# Patient Record
Sex: Male | Born: 1955 | Race: White | Hispanic: No
Health system: Southern US, Academic
[De-identification: ages and names within clinical notes are randomized; demographics above are authoritative.]

## PROBLEM LIST (undated history)

## (undated) ENCOUNTER — Ambulatory Visit: Attending: Physician Assistant | Primary: Physician Assistant

## (undated) ENCOUNTER — Ambulatory Visit

## (undated) ENCOUNTER — Telehealth
Attending: Student in an Organized Health Care Education/Training Program | Primary: Student in an Organized Health Care Education/Training Program

## (undated) ENCOUNTER — Ambulatory Visit: Payer: PRIVATE HEALTH INSURANCE | Attending: Family | Primary: Family

## (undated) ENCOUNTER — Ambulatory Visit: Payer: PRIVATE HEALTH INSURANCE

## (undated) ENCOUNTER — Telehealth

## (undated) ENCOUNTER — Encounter: Payer: PRIVATE HEALTH INSURANCE | Attending: Urology | Primary: Urology

## (undated) ENCOUNTER — Ambulatory Visit: Attending: Urology | Primary: Urology

## (undated) ENCOUNTER — Encounter
Attending: Pharmacist Clinician (PhC)/ Clinical Pharmacy Specialist | Primary: Pharmacist Clinician (PhC)/ Clinical Pharmacy Specialist

## (undated) ENCOUNTER — Telehealth
Attending: Pharmacist Clinician (PhC)/ Clinical Pharmacy Specialist | Primary: Pharmacist Clinician (PhC)/ Clinical Pharmacy Specialist

## (undated) ENCOUNTER — Encounter: Attending: General Practice | Primary: General Practice

## (undated) ENCOUNTER — Ambulatory Visit
Attending: Pharmacist Clinician (PhC)/ Clinical Pharmacy Specialist | Primary: Pharmacist Clinician (PhC)/ Clinical Pharmacy Specialist

## (undated) ENCOUNTER — Ambulatory Visit: Payer: PRIVATE HEALTH INSURANCE | Attending: Urology | Primary: Urology

## (undated) ENCOUNTER — Telehealth: Attending: Urology | Primary: Urology

## (undated) ENCOUNTER — Ambulatory Visit
Attending: Student in an Organized Health Care Education/Training Program | Primary: Student in an Organized Health Care Education/Training Program

## (undated) ENCOUNTER — Encounter: Attending: Family Medicine | Primary: Family Medicine

## (undated) ENCOUNTER — Encounter

## (undated) ENCOUNTER — Ambulatory Visit: Attending: Family Medicine | Primary: Family Medicine

## (undated) DIAGNOSIS — N3289 Other specified disorders of bladder: Secondary | ICD-10-CM

## (undated) DIAGNOSIS — N319 Neuromuscular dysfunction of bladder, unspecified: Secondary | ICD-10-CM

## (undated) DIAGNOSIS — N318 Other neuromuscular dysfunction of bladder: Secondary | ICD-10-CM

## (undated) DIAGNOSIS — IMO0002 Reserved for concepts with insufficient information to code with codable children: Secondary | ICD-10-CM

## (undated) DIAGNOSIS — E785 Hyperlipidemia, unspecified: Secondary | ICD-10-CM

## (undated) DIAGNOSIS — G4700 Insomnia, unspecified: Secondary | ICD-10-CM

## (undated) DIAGNOSIS — I5030 Unspecified diastolic (congestive) heart failure: Secondary | ICD-10-CM

## (undated) DIAGNOSIS — F419 Anxiety disorder, unspecified: Secondary | ICD-10-CM

## (undated) DIAGNOSIS — F1190 Opioid use, unspecified, uncomplicated: Secondary | ICD-10-CM

## (undated) DIAGNOSIS — N529 Male erectile dysfunction, unspecified: Secondary | ICD-10-CM

## (undated) DIAGNOSIS — I739 Peripheral vascular disease, unspecified: Secondary | ICD-10-CM

## (undated) DIAGNOSIS — Z87898 Personal history of other specified conditions: Secondary | ICD-10-CM

## (undated) DIAGNOSIS — M87051 Idiopathic aseptic necrosis of right femur: Secondary | ICD-10-CM

## (undated) DIAGNOSIS — Z8619 Personal history of other infectious and parasitic diseases: Secondary | ICD-10-CM

## (undated) DIAGNOSIS — K219 Gastro-esophageal reflux disease without esophagitis: Secondary | ICD-10-CM

## (undated) DIAGNOSIS — R351 Nocturia: Secondary | ICD-10-CM

## (undated) DIAGNOSIS — R112 Nausea with vomiting, unspecified: Secondary | ICD-10-CM

## (undated) DIAGNOSIS — E119 Type 2 diabetes mellitus without complications: Secondary | ICD-10-CM

## (undated) DIAGNOSIS — M1990 Unspecified osteoarthritis, unspecified site: Secondary | ICD-10-CM

## (undated) DIAGNOSIS — R32 Unspecified urinary incontinence: Secondary | ICD-10-CM

## (undated) DIAGNOSIS — G894 Chronic pain syndrome: Secondary | ICD-10-CM

## (undated) DIAGNOSIS — F319 Bipolar disorder, unspecified: Secondary | ICD-10-CM

## (undated) DIAGNOSIS — I700 Atherosclerosis of aorta: Secondary | ICD-10-CM

## (undated) DIAGNOSIS — F32A Depression, unspecified: Secondary | ICD-10-CM

## (undated) DIAGNOSIS — K759 Inflammatory liver disease, unspecified: Secondary | ICD-10-CM

## (undated) DIAGNOSIS — Z9889 Other specified postprocedural states: Secondary | ICD-10-CM

## (undated) DIAGNOSIS — F329 Major depressive disorder, single episode, unspecified: Secondary | ICD-10-CM

## (undated) DIAGNOSIS — G971 Other reaction to spinal and lumbar puncture: Secondary | ICD-10-CM

## (undated) DIAGNOSIS — M87052 Idiopathic aseptic necrosis of left femur: Secondary | ICD-10-CM

## (undated) DIAGNOSIS — D649 Anemia, unspecified: Secondary | ICD-10-CM

## (undated) DIAGNOSIS — R918 Other nonspecific abnormal finding of lung field: Secondary | ICD-10-CM

## (undated) DIAGNOSIS — E349 Endocrine disorder, unspecified: Secondary | ICD-10-CM

## (undated) DIAGNOSIS — G8929 Other chronic pain: Secondary | ICD-10-CM

## (undated) DIAGNOSIS — F1910 Other psychoactive substance abuse, uncomplicated: Secondary | ICD-10-CM

## (undated) DIAGNOSIS — M25562 Pain in left knee: Secondary | ICD-10-CM

## (undated) HISTORY — DX: Unspecified urinary incontinence: R32

## (undated) HISTORY — PX: BACK SURGERY: SHX140

## (undated) HISTORY — PX: LUMBAR FUSION: SHX111

## (undated) HISTORY — PX: JOINT REPLACEMENT: SHX530

## (undated) MED ORDER — ATORVASTATIN 40 MG TABLET: Freq: Every day | ORAL | 0 days

---
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
## 1998-01-10 HISTORY — PX: SHOULDER OPEN ROTATOR CUFF REPAIR: SHX2407

## 1999-01-11 HISTORY — PX: PARTIAL HIP ARTHROPLASTY: SHX733

## 2000-01-11 HISTORY — PX: TOTAL HIP ARTHROPLASTY: SHX124

## 2002-06-11 HISTORY — PX: TOTAL KNEE ARTHROPLASTY: SHX125

## 2002-08-11 HISTORY — PX: MANIPULATION KNEE JOINT: SUR841

## 2003-01-11 HISTORY — PX: TOTAL KNEE REVISION: SHX996

## 2003-01-11 HISTORY — PX: LUMBAR DISC SURGERY: SHX700

## 2003-07-11 ENCOUNTER — Other Ambulatory Visit: Payer: Self-pay

## 2004-07-07 ENCOUNTER — Ambulatory Visit: Payer: Self-pay | Admitting: Unknown Physician Specialty

## 2004-12-25 ENCOUNTER — Other Ambulatory Visit: Payer: Self-pay

## 2004-12-26 ENCOUNTER — Inpatient Hospital Stay: Payer: Self-pay | Admitting: Internal Medicine

## 2005-01-10 HISTORY — PX: INTERSTIM IMPLANT PLACEMENT: SHX5130

## 2005-01-29 ENCOUNTER — Ambulatory Visit: Payer: Self-pay | Admitting: Family Medicine

## 2005-02-01 ENCOUNTER — Ambulatory Visit: Payer: Self-pay | Admitting: Family Medicine

## 2005-11-01 ENCOUNTER — Ambulatory Visit: Payer: Self-pay | Admitting: Urology

## 2005-12-06 ENCOUNTER — Ambulatory Visit: Payer: Self-pay | Admitting: Urology

## 2006-01-10 HISTORY — PX: COLONOSCOPY: SHX174

## 2006-01-10 HISTORY — PX: TOTAL KNEE REVISION: SHX996

## 2006-02-03 ENCOUNTER — Emergency Department: Payer: Self-pay | Admitting: Emergency Medicine

## 2006-02-16 ENCOUNTER — Ambulatory Visit: Payer: Self-pay | Admitting: Gastroenterology

## 2007-01-29 ENCOUNTER — Encounter: Admission: RE | Admit: 2007-01-29 | Discharge: 2007-01-29 | Payer: Self-pay | Admitting: Orthopedic Surgery

## 2007-03-30 ENCOUNTER — Encounter: Payer: Self-pay | Admitting: Orthopedic Surgery

## 2007-04-11 ENCOUNTER — Encounter: Payer: Self-pay | Admitting: Orthopedic Surgery

## 2007-05-01 ENCOUNTER — Inpatient Hospital Stay (HOSPITAL_COMMUNITY): Admission: RE | Admit: 2007-05-01 | Discharge: 2007-05-03 | Payer: Self-pay | Admitting: Orthopedic Surgery

## 2007-05-01 HISTORY — PX: OTHER SURGICAL HISTORY: SHX169

## 2007-05-11 ENCOUNTER — Encounter: Payer: Self-pay | Admitting: Orthopedic Surgery

## 2007-06-11 ENCOUNTER — Encounter: Payer: Self-pay | Admitting: Orthopedic Surgery

## 2007-07-11 ENCOUNTER — Encounter: Payer: Self-pay | Admitting: Orthopedic Surgery

## 2007-09-21 ENCOUNTER — Encounter: Payer: Self-pay | Admitting: Physical Medicine and Rehabilitation

## 2007-10-11 ENCOUNTER — Encounter: Payer: Self-pay | Admitting: Physical Medicine and Rehabilitation

## 2008-03-17 ENCOUNTER — Emergency Department: Payer: Self-pay | Admitting: Emergency Medicine

## 2009-10-13 ENCOUNTER — Encounter: Payer: Self-pay | Admitting: Orthopedic Surgery

## 2009-11-10 ENCOUNTER — Encounter: Payer: Self-pay | Admitting: Orthopedic Surgery

## 2009-12-10 ENCOUNTER — Encounter: Payer: Self-pay | Admitting: Orthopedic Surgery

## 2010-05-25 NOTE — Op Note (Signed)
NAME:  MITCHAEL, LUCKEY NO.:  [id number]   MEDICAL RECORD NO.:  [id number]          PATIENT TYPE:  INP   LOCATION:  1604                         FACILITY:  [facility]   PHYSICIAN:  Madlyn Frankel. Charlann Boxer, M.D.  DATE OF BIRTH:  [birth year]   DATE OF PROCEDURE:  05/01/2007  DATE OF DISCHARGE:                               OPERATIVE REPORT   PREOPERATIVE DIAGNOSIS:  Painful left total knee replacement,  predominately with mechanical type symptoms along the medial side of the  knee with no signs or concerns for infection.   POSTOPERATIVE DIAGNOSIS:  Painful left total knee replacement,  predominately with mechanical type symptoms along the medial side of the  knee with no signs or concerns for infection.   FINDINGS:  Along the medial side of the knee was demarcated prior to the  procedure and identified some ridging within the soft tissue scarring of  the capsule which could have correlated to the infrapatellar branch of  the saphenous nerve.  Intra-articular noted some scar tissue in addition  to some possible bony impingement along the medial distal femur.   PROCEDURE:  An open debridement of the left total knee including removal  of old sutures and a significant scar debridement and debridement of  bony overgrowth.   SURGEON:  Madlyn Frankel. Charlann Boxer, M.D.   ASSISTANT:  None.   ANESTHESIA:  General.   BLOOD LOSS:  None.   TOURNIQUET TIME:  40 minutes at 225 mmHg.   COMPLICATIONS:  None.   DRAINS:  None.   INDICATIONS FOR PROCEDURE:  Dilan is a 55 year old male who has been  followed in the office for his left knee pain.  He had revision knee  surgery about a year ago with persistent problems with stiffness,  particularly with extension and pain medially.  Initial examination had  raised some concerns about potential neuritis over the saphenous branch.  He had some continued complaints despite attempts at trying to manage  this with superficial topical agents.  He also  described mechanical  symptoms and noted this mainly with stair climbing.  Any time he would  lift his leg and support weight, he would get a pinching type sensation  medially.  He had a palpable area was very tender.  We discussed the  treatment options and given the persistence of discomfort, he wished to  at least exam of the possibility of an open debridement.  Risks,  benefits discussed.  Consent obtained.   PROCEDURE IN DETAIL:  The patient was brought to operative theater.  Once adequate anesthesia, preoperative antibiotics, Ancef were  administered, the patient was positioned supine.  I placed a left  proximal thigh tourniquet.  The left lower extremity was then pre  scrubbed and prepped and draped in sterile fashion.  I did utilize the  patient's old incision, excised it as there were two separate incisions  in the midportion of it.   I created soft tissue flaps down the extensor mechanism identifying a  layer of Ethilon sutures.  I went ahead and created an arthrotomy with  clear synovial joint fluid present.  I spent time to debride this  remaining sutures removed those to prevent the potential granulation and  reaction to the Ethibond suture.   With the soft tissue sleeve created an arthrotomy was made.  I  focused  on area that was identified in the preoperative holding area. Any thing  that was in this area, anything that I was able to palpate that was  abnormal and felt to be a ridge I debrided.  I debrided and then  identified which appeared to be nerve type tissue and debrided this back  to a stable level on the medial side of the knee.   Intra-articularly debrided a significant amount of scar again from the  undersurface of the extensor mechanism and capsule medially.  Trying to  reproduce some of the mechanism of pain producing movements i.e. with  the leg lifted or with the knee bent and found that some bony overgrowth  on the medial side knee or residual  osteophytes that I used a 1/4 inch  osteotome to debride.   At that time that I had finished this procedure the entire medial side  of the knee was debrided back to stable level without evidence of any  impinging soft tissue and medial collateral ligament obviously kept  intact.   There was nothing abnormal with the patella.  There was nothing abnormal  with the tibial tray nor the tibial insert.  Once I was satisfied there  was no other palpable defects nor any other tissue that could be  debrided,  I irrigated the knee with 500 mL of normal saline solution  with pulse lavage.  I reapproximated extensor mechanism this time with a  #1 Vicryl in a running fashion with limited knots.  I  then  reapproximated subcu layer with 2-0 Vicryl and a running 4-0 Monocryl on  the skin.  The skin was cleaned, dried, dressed sterilely with Steri-  Strips.  A tourniquet is let down 40 minutes.  The knee was then dressed  into a sterile bulky Jones dressing and he was brought to recovery room  extubated in stable condition tolerating the procedure well.      Madlyn Frankel Charlann Boxer, M.D.  Electronically Signed     MDO/MEDQ  D:  05/01/2007  T:  05/02/2007  Job:  [id number]

## 2010-08-13 ENCOUNTER — Ambulatory Visit: Payer: Self-pay | Admitting: Specialist

## 2010-10-05 LAB — DIFFERENTIAL
Basophils Absolute: 0
Basophils Relative: 0
Eosinophils Absolute: 0.1
Eosinophils Relative: 1
Lymphocytes Relative: 29
Lymphs Abs: 1.8
Monocytes Absolute: 0.4
Monocytes Relative: 6
Neutro Abs: 4.1
Neutrophils Relative %: 64

## 2010-10-05 LAB — COMPREHENSIVE METABOLIC PANEL
ALT: 34
AST: 28
Albumin: 4.2
Alkaline Phosphatase: 55
BUN: 10
CO2: 30
Calcium: 9.8
Chloride: 103
Creatinine, Ser: 1.04
GFR calc non Af Amer: >60
Glucose, Bld: 137 — ABNORMAL HIGH
Potassium: 4.2
Sodium: 142
Total Bilirubin: 1.2
Total Protein: 7.5

## 2010-10-05 LAB — CBC
HCT: 37.6 — ABNORMAL LOW
HCT: 46.1
Hemoglobin: 13.2
Hemoglobin: 16.1
MCHC: 35
MCHC: 35
MCV: 88.8
MCV: 89.5
Platelets: 177
Platelets: 222
RBC: 4.2 — ABNORMAL LOW
RBC: 5.2
RDW: 12.8
RDW: 13.3
WBC: 6.4
WBC: 8.7

## 2010-10-05 LAB — APTT: aPTT: 30

## 2010-10-05 LAB — BASIC METABOLIC PANEL
BUN: 10
CO2: 33 — ABNORMAL HIGH
Calcium: 8.4
Chloride: 100
Creatinine, Ser: 1.06
GFR calc non Af Amer: >60
Glucose, Bld: 105 — ABNORMAL HIGH
Potassium: 3.7
Sodium: 139

## 2010-10-05 LAB — URINALYSIS, ROUTINE W REFLEX MICROSCOPIC
Bilirubin Urine: NEGATIVE
Glucose, UA: NEGATIVE
Hgb urine dipstick: NEGATIVE
Ketones, ur: NEGATIVE
Nitrite: NEGATIVE
Protein, ur: NEGATIVE
Specific Gravity, Urine: 1.022
Urobilinogen, UA: 0.2
pH: 5.5

## 2010-10-05 LAB — PROTIME-INR
INR: 1
Prothrombin Time: 13.7

## 2010-12-31 ENCOUNTER — Emergency Department: Payer: Self-pay | Admitting: *Deleted

## 2011-08-30 ENCOUNTER — Ambulatory Visit: Payer: Self-pay | Admitting: Specialist

## 2012-07-16 DIAGNOSIS — M5136 Other intervertebral disc degeneration, lumbar region: Secondary | ICD-10-CM | POA: Insufficient documentation

## 2012-07-16 DIAGNOSIS — G479 Sleep disorder, unspecified: Secondary | ICD-10-CM | POA: Insufficient documentation

## 2012-07-16 DIAGNOSIS — F329 Major depressive disorder, single episode, unspecified: Secondary | ICD-10-CM | POA: Insufficient documentation

## 2012-07-26 ENCOUNTER — Other Ambulatory Visit: Payer: Self-pay | Admitting: Urology

## 2012-08-10 ENCOUNTER — Encounter (HOSPITAL_BASED_OUTPATIENT_CLINIC_OR_DEPARTMENT_OTHER): Payer: Self-pay | Admitting: *Deleted

## 2012-08-10 NOTE — Progress Notes (Signed)
NPO AFTER MN. ARRIVES AT 1000. NEEDS HG. WILL TAKE OXYCONTIN, OXYCODONE, LEXAPRO, AND WELLBUTRIN AM OF SURG W/ SIPS OF WATER.

## 2012-08-16 NOTE — H&P (Signed)
History of Present Illness   [name] had an Interstim possibly 7-8 years ago by Dr. Cecilie Martinez. For some personal reasons he wanted to be followed here and I have seen him in the past. He says the Interstim was placed for urge incontinence and maybe incontinence after voiding. It was placed for frequency, decreased stream and nocturia. When the Interstim was working a few weeks ago, he was getting up 3 times a night instead of 6 and he was dry or nearly dry, going less frequency with a better flow.   In the last week, he is leaking, especially after he voids. He is getting up many times at night. He is going more frequently during the day. His flow is slower.   There is no other modifying factors or associated signs or symptoms. There is no other aggravating or relieving factors. The symptoms are moderate in severity and persisting.   Urinalysis: I reviewed, negative. Urine sent for culture.  On physical examination, the device is fairly high in the right upper buttock but there is no evidence of infection or cellulitis and it is nontender.    Past Medical History Problems  1. History of  Adult Sleep Apnea 780.57 2. History of  Anxiety (Symptom) 300.00 3. History of  Arthritis V13.4 4. History of  Esophageal Reflux 530.81 5. History of  Hepatitis 573.3  Surgical History Problems  1. History of  Back Surgery 2. History of  Bladder Surgery 3. History of  Hand Surgery 4. History of  Hip Surgery 5. History of  Knee Surgery 6. History of  Shoulder Surgery  Current Meds 1. Ambien 10 MG Oral Tablet; Therapy: (Recorded:12Oct2010) to 2. Diazepam TABS; Therapy: (Recorded:12Oct2010) to 3. OxyCODONE HCl TABS; Therapy: (Recorded:12Oct2010) to 4. OxyCONTIN TB12; Therapy: (Recorded:12Oct2010) to 5. PriLOSEC CPDR; Therapy: (Recorded:12Oct2010) to  Family History Problems  1. Family history of  Family Health Status Number Of Children 2 sons1 daughter  Social History Problems  1. Marital  History - Divorced 2. Occupation: disabled Denied  3. History of  Alcohol Use 4. History of  Caffeine Use 5. History of  Tobacco Use  Results/Data  Urine [Data Includes: Last 1 Day]   16Jul2014  COLOR ORANGE   APPEARANCE CLEAR   SPECIFIC GRAVITY >1.030   pH 6.0   GLUCOSE 250 mg/dL  BILIRUBIN NEG   KETONE NEG mg/dL  BLOOD NEG   PROTEIN 30 mg/dL  UROBILINOGEN 0.2 mg/dL  NITRITE NEG   LEUKOCYTE ESTERASE NEG   SQUAMOUS EPITHELIAL/HPF NONE SEEN   WBC 0-2 WBC/hpf  RBC NONE SEEN RBC/hpf  BACTERIA RARE   CRYSTALS NONE SEEN   CASTS NONE SEEN   Other MUCUS AND AMORPHOUS NOTED    Assessment Assessed  1. Nocturia 788.43 2. 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 My plan is to change the generator but also test the lead intraoperatively. The patient consented to replacement of the IPG and possibly the lead if needed. Whichever is replaced, I cannot promise that it will be effective, but hopefully it will be. General risks were reviewed and he has had one before and by history no revisions.   Pros, cons, success and failure rates of Interstim were discussed. We talked about the test stimulation (office/operating room) and the second stage procedure. Risks were described but not limited to the risk of persistent, de novo, or worsening incontinence. Risks of pain, bleeding, infection,  and neuropathy were discussed. Risk of malfunction, migration, and breakage were discussed. Trouble-shooting, battery life, and the need for explanation and reoperation were discussed. MRI issues were discussed. The patient understands that she might not reach her treatment goal and that she might be worse following surgery.  He understand that he may be at slight increased risk of infection.  He does have significant back issues noted today.   When [facility] did the troubleshooting, it said end of  service. He was able to check impedance and they be abnormal in all 4 leads, but it may be a false reading because of the end of service reading. The patient had been consented to replace the lead if needed.   After a thorough review of the management options for the patient's condition the patient  elected to proceed with surgical therapy as noted above. We have discussed the potential benefits and risks of the procedure, side effects of the proposed treatment, the likelihood of the patient achieving the goals of the procedure, and any potential problems that might occur during the procedure or recuperation. Informed consent has been obtained.

## 2012-08-17 ENCOUNTER — Ambulatory Visit (HOSPITAL_COMMUNITY): Payer: Medicare Other

## 2012-08-17 ENCOUNTER — Encounter (HOSPITAL_BASED_OUTPATIENT_CLINIC_OR_DEPARTMENT_OTHER): Payer: Self-pay | Admitting: Anesthesiology

## 2012-08-17 ENCOUNTER — Ambulatory Visit (HOSPITAL_BASED_OUTPATIENT_CLINIC_OR_DEPARTMENT_OTHER): Payer: Medicare Other | Admitting: Anesthesiology

## 2012-08-17 ENCOUNTER — Ambulatory Visit (HOSPITAL_BASED_OUTPATIENT_CLINIC_OR_DEPARTMENT_OTHER)
Admission: RE | Admit: 2012-08-17 | Discharge: 2012-08-17 | Disposition: A | Discharge status: Home / Self Care | Payer: Medicare Other | Source: Ambulatory Visit | Attending: Urology | Admitting: Urology

## 2012-08-17 ENCOUNTER — Encounter (HOSPITAL_BASED_OUTPATIENT_CLINIC_OR_DEPARTMENT_OTHER)
Admission: RE | Disposition: A | Discharge status: Home / Self Care | Payer: Self-pay | Source: Ambulatory Visit | Attending: Urology

## 2012-08-17 ENCOUNTER — Encounter (HOSPITAL_BASED_OUTPATIENT_CLINIC_OR_DEPARTMENT_OTHER): Payer: Self-pay

## 2012-08-17 DIAGNOSIS — Z79899 Other long term (current) drug therapy: Secondary | ICD-10-CM | POA: Insufficient documentation

## 2012-08-17 DIAGNOSIS — R350 Frequency of micturition: Secondary | ICD-10-CM | POA: Insufficient documentation

## 2012-08-17 DIAGNOSIS — N3941 Urge incontinence: Secondary | ICD-10-CM | POA: Diagnosis not present

## 2012-08-17 DIAGNOSIS — K219 Gastro-esophageal reflux disease without esophagitis: Secondary | ICD-10-CM | POA: Diagnosis not present

## 2012-08-17 DIAGNOSIS — Z462 Encounter for fitting and adjustment of other devices related to nervous system and special senses: Secondary | ICD-10-CM | POA: Insufficient documentation

## 2012-08-17 DIAGNOSIS — G4730 Sleep apnea, unspecified: Secondary | ICD-10-CM | POA: Diagnosis not present

## 2012-08-17 HISTORY — DX: Chronic pain syndrome: G89.4

## 2012-08-17 HISTORY — DX: Personal history of other infectious and parasitic diseases: Z86.19

## 2012-08-17 HISTORY — DX: Unspecified osteoarthritis, unspecified site: M19.90

## 2012-08-17 HISTORY — PX: INTERSTIM IMPLANT REVISION: SHX5138

## 2012-08-17 HISTORY — DX: Nocturia: R35.1

## 2012-08-17 HISTORY — DX: Reserved for concepts with insufficient information to code with codable children: IMO0002

## 2012-08-17 HISTORY — DX: Depression, unspecified: F32.A

## 2012-08-17 HISTORY — DX: Idiopathic aseptic necrosis of right femur: M87.052

## 2012-08-17 HISTORY — DX: Other chronic pain: G89.29

## 2012-08-17 HISTORY — DX: Anxiety disorder, unspecified: F41.9

## 2012-08-17 HISTORY — DX: Idiopathic aseptic necrosis of left femur: M87.051

## 2012-08-17 HISTORY — DX: Other specified postprocedural states: Z98.890

## 2012-08-17 HISTORY — DX: Major depressive disorder, single episode, unspecified: F32.9

## 2012-08-17 HISTORY — DX: Other specified disorders of bladder: N32.89

## 2012-08-17 HISTORY — DX: Pain in left knee: M25.562

## 2012-08-17 HISTORY — DX: Other neuromuscular dysfunction of bladder: N31.8

## 2012-08-17 HISTORY — DX: Nausea with vomiting, unspecified: R11.2

## 2012-08-17 HISTORY — DX: Idiopathic aseptic necrosis of right femur: M87.051

## 2012-08-17 LAB — POCT HEMOGLOBIN-HEMACUE: Hemoglobin: 13.8 g/dL (ref 13.0–17.0)

## 2012-08-17 SURGERY — REVISION, SACRAL NERVE STIMULATOR, INTERSTIM
Anesthesia: Monitor Anesthesia Care | Site: Back | Wound class: Clean

## 2012-08-17 MED ORDER — LACTATED RINGERS IV SOLN
INTRAVENOUS | Status: DC
  Administered 2012-08-17: 09:00:00 via INTRAVENOUS
  Filled 2012-08-17: qty 1000

## 2012-08-17 MED ORDER — STERILE WATER FOR IRRIGATION IR SOLN
Status: DC | PRN
  Administered 2012-08-17: 1000 mL

## 2012-08-17 MED ORDER — CEPHALEXIN 250 MG PO CAPS: 500.0000 mg | ORAL_CAPSULE | Freq: Three times a day (TID) | ORAL | Status: DC

## 2012-08-17 MED ORDER — CEFAZOLIN SODIUM 1-5 GM-% IV SOLN
1.0000 g | INTRAVENOUS | Status: DC
  Filled 2012-08-17: qty 50

## 2012-08-17 MED ORDER — FENTANYL CITRATE 0.05 MG/ML IJ SOLN
INTRAMUSCULAR | Status: DC | PRN
  Administered 2012-08-17 (×2): 100 ug via INTRAVENOUS

## 2012-08-17 MED ORDER — CEFAZOLIN SODIUM-DEXTROSE 2-3 GM-% IV SOLR
2.0000 g | INTRAVENOUS | Status: AC
  Administered 2012-08-17: 2 g via INTRAVENOUS
  Filled 2012-08-17: qty 50

## 2012-08-17 MED ORDER — LACTATED RINGERS IV SOLN
INTRAVENOUS | Status: DC | PRN
  Administered 2012-08-17: 09:00:00 via INTRAVENOUS

## 2012-08-17 MED ORDER — LIDOCAINE-EPINEPHRINE (PF) 1 %-1:200000 IJ SOLN
INTRAMUSCULAR | Status: DC | PRN
  Administered 2012-08-17: 22.5 mL

## 2012-08-17 MED ORDER — PROPOFOL 10 MG/ML IV EMUL
INTRAVENOUS | Status: DC | PRN
  Administered 2012-08-17: 50 ug/kg/min via INTRAVENOUS

## 2012-08-17 MED ORDER — BUPIVACAINE-EPINEPHRINE 0.5% -1:200000 IJ SOLN
INTRAMUSCULAR | Status: DC | PRN
  Administered 2012-08-17: 22.5 mL

## 2012-08-17 MED ORDER — MIDAZOLAM HCL 5 MG/5ML IJ SOLN
INTRAMUSCULAR | Status: DC | PRN
  Administered 2012-08-17 (×2): 2 mg via INTRAVENOUS

## 2012-08-17 MED ORDER — LIDOCAINE HCL (CARDIAC) 20 MG/ML IV SOLN
INTRAVENOUS | Status: DC | PRN
  Administered 2012-08-17: 80 mg via INTRAVENOUS

## 2012-08-17 SURGICAL SUPPLY — 44 items
BAG URINE LEG 500ML (DRAIN) IMPLANT
BANDAGE ADHESIVE 1X3 (GAUZE/BANDAGES/DRESSINGS) IMPLANT
BENZOIN TINCTURE PRP APPL 2/3 (GAUZE/BANDAGES/DRESSINGS) IMPLANT
BLADE HEX COATED 2.75 (ELECTRODE) ×2 IMPLANT
BLADE SURG 15 STRL LF DISP TIS (BLADE) ×1 IMPLANT
BLADE SURG 15 STRL SS (BLADE) ×1
CATH FOLEY 2WAY SLVR  5CC 16FR (CATHETERS)
CATH FOLEY 2WAY SLVR 5CC 16FR (CATHETERS) IMPLANT
CLOTH BEACON ORANGE TIMEOUT ST (SAFETY) ×2 IMPLANT
COVER MAYO STAND STRL (DRAPES) ×2 IMPLANT
COVER PROBE W GEL 5X96 (DRAPES) ×2 IMPLANT
COVER TABLE BACK 60X90 (DRAPES) ×2 IMPLANT
DERMABOND ADVANCED (GAUZE/BANDAGES/DRESSINGS) ×1
DERMABOND ADVANCED .7 DNX12 (GAUZE/BANDAGES/DRESSINGS) ×1 IMPLANT
DRAPE C-ARM 42X72 X-RAY (DRAPES) ×2 IMPLANT
DRAPE INCISE 23X17 IOBAN STRL (DRAPES) ×1
DRAPE INCISE IOBAN 23X17 STRL (DRAPES) ×1 IMPLANT
DRAPE LAPAROSCOPIC ABDOMINAL (DRAPES) ×2 IMPLANT
DRESSING TELFA 8X3 (GAUZE/BANDAGES/DRESSINGS) ×2 IMPLANT
DRSG TEGADERM 4X4.75 (GAUZE/BANDAGES/DRESSINGS) ×2 IMPLANT
ELECT REM PT RETURN 9FT ADLT (ELECTROSURGICAL)
ELECTRODE REM PT RTRN 9FT ADLT (ELECTROSURGICAL) IMPLANT
GAUZE SPONGE 4X4 12PLY STRL LF (GAUZE/BANDAGES/DRESSINGS) ×2 IMPLANT
GLOVE BIO SURGEON STRL SZ7.5 (GLOVE) ×2 IMPLANT
GOWN PREVENTION PLUS LG XLONG (DISPOSABLE) ×2 IMPLANT
GOWN STRL REIN XL XLG (GOWN DISPOSABLE) ×2 IMPLANT
INTRODUCER GUIDE DILATR SHEATH (SET/KITS/TRAYS/PACK) ×2 IMPLANT
LEAD (Lead) ×2 IMPLANT
NEEDLE FORAMEN 20GA 3.5  9CM (NEEDLE) IMPLANT
NEEDLE FORAMEN 20GA 5  12.5CM (NEEDLE) IMPLANT
NEEDLE HYPO 22GX1.5 SAFETY (NEEDLE) IMPLANT
PACK BASIN DAY SURGERY FS (CUSTOM PROCEDURE TRAY) ×2 IMPLANT
PENCIL BUTTON HOLSTER BLD 10FT (ELECTRODE) IMPLANT
PROGRAMMER ANTENNA EXT (UROLOGICAL SUPPLIES) ×2 IMPLANT
PROGRAMMER STIMUL 2.2X1.1X3.7 (UROLOGICAL SUPPLIES) ×2 IMPLANT
STAPLER VISISTAT 35W (STAPLE) IMPLANT
STIMULATOR INTERSTIM 2X1.7X.3 (Orthopedic Implant) ×2 IMPLANT
STRIP CLOSURE SKIN 1/2X4 (GAUZE/BANDAGES/DRESSINGS) ×2 IMPLANT
SUT VIC AB 3-0 SH 27 (SUTURE) ×2
SUT VIC AB 3-0 SH 27X BRD (SUTURE) ×2 IMPLANT
SUT VICRYL 4-0 PS2 18IN ABS (SUTURE) ×6 IMPLANT
SYR BULB IRRIGATION 50ML (SYRINGE) ×2 IMPLANT
SYR CONTROL 10ML LL (SYRINGE) IMPLANT
TOWEL OR 17X24 6PK STRL BLUE (TOWEL DISPOSABLE) ×4 IMPLANT

## 2012-08-17 NOTE — Op Note (Signed)
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 Lorin Picket Eular Panek  The patient has the above diagnoses consent the above procedure. Preoperative antibiotics were given. Skin preparation was done per usual  By palpation and AP fluoroscopy I could see the lead and generator and x-rays were taken. The lead looked a little bit low but it turned out to be in S3 but the angle was very flat  Total lidocaine epinephrine mixture was approximately 30 cc for the midline and lateral incision. I opened the lateral incision and carried my dissection down opening is pseudocapsule and easily delivering the IPG. It was disconnected with a screwdriver.  He had bellows response in all 4 positions but no toe. Because the x-ray findings and lack a toe response I decided to replace the lead  With fluoroscopy and direct vision and pulling on the lead I marked the midline incision. I made 2 cm in length and carried down through soft tissue feeling the lead. I passed a right angle underneath it easily delivered it from lateral to medial.  I then mobilized the lead with sharp and blunt dissection almost to the bone table. I used lateral fluoroscopy and placed a hemostat almost at the bone table and remove the lead in total.  I then placed a 3.5 framing needle through the S3 foramina in a more oblique angle and more medial to lateral. In order do so I had to bring it a little bit medial to the initial incision. I had excellent bellows and toe response. I then replaced the framing needle with the guide and then connected the incision with a scalpel. There is a slight bend to the incision but I prefer this or second incision.   I placed the white trocar to the appropriate depth using fluoroscopy. I passed the floppy lead to  the appropriate depth and pulled the white trocar back. He had excellent toe and bellows response in all 4 positions. The settings were low. They range between 2-4. X-rays are taking in the AP and lateral positions.  Lead was brought lateral from the medial incision with the passer. Generator was attached and placed in its original pocket. Impedance check was done and was excellent and normal in all 4 leads  Irrigation was utilized. 3-0 Vicryl for subcutaneous tissue followed by 4 subcuticular was utilized. Usual sterile dressing was placed

## 2012-08-17 NOTE — Progress Notes (Signed)
Received call from Dr. Sherron Monday.  He is unable to see pt prior to  D/c.  Apologies relayed to pt.  Pt instructed to remove dressing in 5-6 days.  Leave dermabond alone. It will peel off.  No heavy lifting or strenuous activity x 2weeks. Pt informed also that the entire leads and generator were replaced. Pt verbalized his understanding.

## 2012-08-17 NOTE — Interval H&P Note (Signed)
History and Physical Interval Note:  08/17/2012 10:07 AM  Erik Martinez  has presented today for surgery, with the diagnosis of MALFUNCTIONING INTERSTIM   The various methods of treatment have been discussed with the patient and family. After consideration of risks, benefits and other options for treatment, the patient has consented to  Procedure(s): REPLACMENT OF IPG PLUS/MINUS LEAD OF INTERSTIM IMPLANT  (N/A) as a surgical intervention .  The patient's history has been reviewed, patient examined, no change in status, stable for surgery.  I have reviewed the patient's chart and labs.  Questions were answered to the patient's satisfaction.     Neilah Fulwider A

## 2012-08-17 NOTE — Anesthesia Preprocedure Evaluation (Addendum)
Anesthesia Evaluation  Patient identified by MRN, date of birth, ID band Patient awake    Reviewed: Allergy & Precautions, H&P , NPO status , Patient's Chart, lab work & pertinent test results  History of Anesthesia Complications (+) PONV  Airway Mallampati: II TM Distance: >3 FB Neck ROM: Full    Dental  (+) Dental Advisory Given and Edentulous Upper   Pulmonary neg pulmonary ROS,  breath sounds clear to auscultation        Cardiovascular negative cardio ROS  Rhythm:Regular Rate:Normal     Neuro/Psych PSYCHIATRIC DISORDERS Anxiety Depression negative neurological ROS     GI/Hepatic negative GI ROS, Neg liver ROS,   Endo/Other  negative endocrine ROS  Renal/GU negative Renal ROS     Musculoskeletal negative musculoskeletal ROS (+)   Abdominal   Peds  Hematology negative hematology ROS (+)   Anesthesia Other Findings   Reproductive/Obstetrics negative OB ROS                          Anesthesia Physical Anesthesia Plan  ASA: II  Anesthesia Plan: MAC   Post-op Pain Management:    Induction: Intravenous  Airway Management Planned:   Additional Equipment:   Intra-op Plan:   Post-operative Plan:   Informed Consent: I have reviewed the patients History and Physical, chart, labs and discussed the procedure including the risks, benefits and alternatives for the proposed anesthesia with the patient or authorized representative who has indicated his/her understanding and acceptance.   Dental advisory given  Plan Discussed with: CRNA  Anesthesia Plan Comments:         Anesthesia Quick Evaluation

## 2012-08-17 NOTE — Transfer of Care (Signed)
Immediate Anesthesia Transfer of Care Note  Patient: Erik Martinez  Procedure(s) Performed: Procedure(s): REPLACMENT OF IPG PLUS REPLACE LEAD OF INTERSTIM IMPLANT  (N/A)  Patient Location: PACU  Anesthesia Type:MAC  Level of Consciousness: awake, alert  and oriented  Airway & Oxygen Therapy: Patient Spontanous Breathing  Post-op Assessment: Report given to PACU RN  Post vital signs: Reviewed and stable  Complications: No apparent anesthesia complications

## 2012-08-18 NOTE — Anesthesia Postprocedure Evaluation (Signed)
Anesthesia Post Note  Patient: Erik Martinez  Procedure(s) Performed: Procedure(s) (LRB): REPLACMENT OF IPG PLUS REPLACE LEAD OF INTERSTIM IMPLANT  (N/A)  Anesthesia type: MAC  Patient location: PACU  Post pain: Pain level controlled  Post assessment: Post-op Vital signs reviewed  Last Vitals: BP 104/64  Pulse 78  Temp(Src) 36.2 C (Oral)  Resp 16  Ht 5\' 11"  (1.803 m)  Wt 196 lb 8 oz (89.132 kg)  BMI 27.42 kg/m2  SpO2 95%  Post vital signs: Reviewed  Level of consciousness: awake  Complications: No apparent anesthesia complications

## 2012-08-21 ENCOUNTER — Encounter (HOSPITAL_BASED_OUTPATIENT_CLINIC_OR_DEPARTMENT_OTHER): Payer: Self-pay | Admitting: Urology

## 2012-09-14 ENCOUNTER — Ambulatory Visit: Payer: Self-pay | Admitting: Surgery

## 2012-10-03 ENCOUNTER — Ambulatory Visit: Payer: Self-pay | Admitting: Surgery

## 2012-10-03 DIAGNOSIS — G4730 Sleep apnea, unspecified: Secondary | ICD-10-CM

## 2012-10-03 LAB — CBC WITH DIFFERENTIAL/PLATELET
Basophil #: 0 10*3/uL (ref 0.0–0.1)
Basophil %: 0.5 %
Eosinophil #: 0.1 10*3/uL (ref 0.0–0.7)
Eosinophil %: 1.9 %
HCT: 44.1 % (ref 40.0–52.0)
HGB: 15.4 g/dL (ref 13.0–18.0)
Lymphocyte #: 2.4 10*3/uL (ref 1.0–3.6)
Lymphocyte %: 33.9 %
MCH: 31.1 pg (ref 26.0–34.0)
MCHC: 34.8 g/dL (ref 32.0–36.0)
MCV: 89 fL (ref 80–100)
Monocyte #: 0.4 x10 3/mm (ref 0.2–1.0)
Monocyte %: 5.9 %
Neutrophil #: 4.1 10*3/uL (ref 1.4–6.5)
Neutrophil %: 57.8 %
Platelet: 199 10*3/uL (ref 150–440)
RBC: 4.95 10*6/uL (ref 4.40–5.90)
RDW: 13.3 % (ref 11.5–14.5)
WBC: 7 10*3/uL (ref 3.8–10.6)

## 2012-10-03 LAB — BASIC METABOLIC PANEL
Anion Gap: 2 — ABNORMAL LOW (ref 7–16)
BUN: 13 mg/dL (ref 7–18)
Calcium, Total: 9 mg/dL (ref 8.5–10.1)
Chloride: 98 mmol/L (ref 98–107)
Co2: 33 mmol/L — ABNORMAL HIGH (ref 21–32)
Creatinine: 1.13 mg/dL (ref 0.60–1.30)
EGFR (African American): >60
EGFR (Non-African Amer.): >60
Glucose: 250 mg/dL — ABNORMAL HIGH (ref 65–99)
Osmolality: 275 (ref 275–301)
Potassium: 4.6 mmol/L (ref 3.5–5.1)
Sodium: 133 mmol/L — ABNORMAL LOW (ref 136–145)

## 2012-10-11 ENCOUNTER — Ambulatory Visit: Payer: Self-pay | Admitting: Unknown Physician Specialty

## 2012-10-12 LAB — PATHOLOGY REPORT

## 2012-11-20 ENCOUNTER — Ambulatory Visit: Payer: Self-pay | Admitting: Cardiovascular Disease

## 2012-11-20 LAB — BASIC METABOLIC PANEL
Anion Gap: 4 — ABNORMAL LOW (ref 7–16)
BUN: 19 mg/dL — ABNORMAL HIGH (ref 7–18)
Calcium, Total: 8.8 mg/dL (ref 8.5–10.1)
Chloride: 103 mmol/L (ref 98–107)
Co2: 30 mmol/L (ref 21–32)
Creatinine: 1.03 mg/dL (ref 0.60–1.30)
EGFR (African American): >60
EGFR (Non-African Amer.): >60
Glucose: 221 mg/dL — ABNORMAL HIGH (ref 65–99)
Osmolality: 283 (ref 275–301)
Potassium: 3.8 mmol/L (ref 3.5–5.1)
Sodium: 137 mmol/L (ref 136–145)

## 2012-11-20 LAB — CBC WITH DIFFERENTIAL/PLATELET
Basophil #: 0 10*3/uL (ref 0.0–0.1)
Basophil %: 0.6 %
Eosinophil #: 0.2 10*3/uL (ref 0.0–0.7)
Eosinophil %: 3.6 %
HCT: 39 % — ABNORMAL LOW (ref 40.0–52.0)
HGB: 13.7 g/dL (ref 13.0–18.0)
Lymphocyte #: 1.6 10*3/uL (ref 1.0–3.6)
Lymphocyte %: 33 %
MCH: 31.1 pg (ref 26.0–34.0)
MCHC: 35 g/dL (ref 32.0–36.0)
MCV: 89 fL (ref 80–100)
Monocyte #: 0.4 x10 3/mm (ref 0.2–1.0)
Monocyte %: 9.2 %
Neutrophil #: 2.6 10*3/uL (ref 1.4–6.5)
Neutrophil %: 53.6 %
Platelet: 156 10*3/uL (ref 150–440)
RBC: 4.39 10*6/uL — ABNORMAL LOW (ref 4.40–5.90)
RDW: 13.3 % (ref 11.5–14.5)
WBC: 4.8 10*3/uL (ref 3.8–10.6)

## 2013-05-20 ENCOUNTER — Inpatient Hospital Stay: Payer: Self-pay | Admitting: Internal Medicine

## 2013-05-20 LAB — DRUG SCREEN, URINE
Amphetamines, Ur Screen: NEGATIVE (ref ?–1000)
Barbiturates, Ur Screen: NEGATIVE (ref ?–200)
Benzodiazepine, Ur Scrn: POSITIVE (ref ?–200)
Cannabinoid 50 Ng, Ur ~~LOC~~: NEGATIVE (ref ?–50)
Cocaine Metabolite,Ur ~~LOC~~: NEGATIVE (ref ?–300)
MDMA (Ecstasy)Ur Screen: NEGATIVE (ref ?–500)
Methadone, Ur Screen: NEGATIVE (ref ?–300)
Opiate, Ur Screen: POSITIVE (ref ?–300)
Phencyclidine (PCP) Ur S: NEGATIVE (ref ?–25)
Tricyclic, Ur Screen: NEGATIVE (ref ?–1000)

## 2013-05-20 LAB — HEPATIC FUNCTION PANEL A (ARMC)
Albumin: 3.2 g/dL — ABNORMAL LOW (ref 3.4–5.0)
Alkaline Phosphatase: 51 U/L
Bilirubin, Direct: 0.2 mg/dL (ref 0.00–0.20)
Bilirubin,Total: 0.9 mg/dL (ref 0.2–1.0)
SGOT(AST): 29 U/L (ref 15–37)
SGPT (ALT): 36 U/L (ref 12–78)
Total Protein: 6.3 g/dL — ABNORMAL LOW (ref 6.4–8.2)

## 2013-05-20 LAB — URINALYSIS, COMPLETE
Bilirubin,UR: NEGATIVE
Blood: NEGATIVE
Glucose,UR: >=500 mg/dL (ref 0–75)
Hyaline Cast: 4
Ketone: NEGATIVE
Leukocyte Esterase: NEGATIVE
Nitrite: NEGATIVE
Ph: 5 (ref 4.5–8.0)
Protein: NEGATIVE
RBC,UR: 4 /HPF (ref 0–5)
Specific Gravity: 1.017 (ref 1.003–1.030)
Squamous Epithelial: NONE SEEN
WBC UR: NONE SEEN /HPF (ref 0–5)

## 2013-05-20 LAB — COMPREHENSIVE METABOLIC PANEL
Albumin: 3.7 g/dL (ref 3.4–5.0)
Alkaline Phosphatase: 64 U/L
Anion Gap: 11 (ref 7–16)
BUN: 18 mg/dL (ref 7–18)
Bilirubin,Total: 0.3 mg/dL (ref 0.2–1.0)
Calcium, Total: 8.6 mg/dL (ref 8.5–10.1)
Chloride: 98 mmol/L (ref 98–107)
Co2: 27 mmol/L (ref 21–32)
Creatinine: 1.4 mg/dL — ABNORMAL HIGH (ref 0.60–1.30)
EGFR (African American): >60
EGFR (Non-African Amer.): 56 — ABNORMAL LOW
Glucose: 303 mg/dL — ABNORMAL HIGH (ref 65–99)
Osmolality: 285 (ref 275–301)
Potassium: 3.9 mmol/L (ref 3.5–5.1)
SGOT(AST): 50 U/L — ABNORMAL HIGH (ref 15–37)
SGPT (ALT): 51 U/L (ref 12–78)
Sodium: 136 mmol/L (ref 136–145)
Total Protein: 7.5 g/dL (ref 6.4–8.2)

## 2013-05-20 LAB — CBC WITH DIFFERENTIAL/PLATELET
Basophil #: 0 10*3/uL (ref 0.0–0.1)
Basophil %: 0.3 %
Eosinophil #: 0.1 10*3/uL (ref 0.0–0.7)
Eosinophil %: 0.7 %
HCT: 44.1 % (ref 40.0–52.0)
HGB: 14.5 g/dL (ref 13.0–18.0)
Lymphocyte #: 1.7 10*3/uL (ref 1.0–3.6)
Lymphocyte %: 14.2 %
MCH: 29.2 pg (ref 26.0–34.0)
MCHC: 32.9 g/dL (ref 32.0–36.0)
MCV: 89 fL (ref 80–100)
Monocyte #: 0.6 x10 3/mm (ref 0.2–1.0)
Monocyte %: 4.7 %
Neutrophil #: 9.6 10*3/uL — ABNORMAL HIGH (ref 1.4–6.5)
Neutrophil %: 80.1 %
Platelet: 194 10*3/uL (ref 150–440)
RBC: 4.96 10*6/uL (ref 4.40–5.90)
RDW: 12.5 % (ref 11.5–14.5)
WBC: 12.1 10*3/uL — ABNORMAL HIGH (ref 3.8–10.6)

## 2013-05-20 LAB — SALICYLATE LEVEL: Salicylates, Serum: <1.7 mg/dL

## 2013-05-20 LAB — TRIGLYCERIDES: Triglycerides: 125 mg/dL (ref 0–200)

## 2013-05-20 LAB — TROPONIN I: Troponin-I: <0.02 ng/mL

## 2013-05-20 LAB — ETHANOL
Ethanol %: <0.003 % (ref 0.000–0.080)
Ethanol: <3 mg/dL

## 2013-05-20 LAB — ACETAMINOPHEN LEVEL: Acetaminophen: <2 ug/mL

## 2013-05-21 LAB — CBC WITH DIFFERENTIAL/PLATELET
Basophil #: 0 10*3/uL (ref 0.0–0.1)
Basophil %: 0.4 %
Eosinophil #: 0.1 10*3/uL (ref 0.0–0.7)
Eosinophil %: 0.8 %
HCT: 38.7 % — ABNORMAL LOW (ref 40.0–52.0)
HGB: 13.4 g/dL (ref 13.0–18.0)
Lymphocyte #: 1.6 10*3/uL (ref 1.0–3.6)
Lymphocyte %: 20 %
MCH: 30.4 pg (ref 26.0–34.0)
MCHC: 34.6 g/dL (ref 32.0–36.0)
MCV: 88 fL (ref 80–100)
Monocyte #: 0.5 x10 3/mm (ref 0.2–1.0)
Monocyte %: 5.5 %
Neutrophil #: 6 10*3/uL (ref 1.4–6.5)
Neutrophil %: 73.3 %
Platelet: 148 10*3/uL — ABNORMAL LOW (ref 150–440)
RBC: 4.41 10*6/uL (ref 4.40–5.90)
RDW: 13 % (ref 11.5–14.5)
WBC: 8.2 10*3/uL (ref 3.8–10.6)

## 2013-05-21 LAB — COMPREHENSIVE METABOLIC PANEL
Albumin: 2.9 g/dL — ABNORMAL LOW (ref 3.4–5.0)
Alkaline Phosphatase: 51 U/L
Anion Gap: 5 — ABNORMAL LOW (ref 7–16)
BUN: 10 mg/dL (ref 7–18)
Bilirubin,Total: 0.7 mg/dL (ref 0.2–1.0)
Calcium, Total: 8.4 mg/dL — ABNORMAL LOW (ref 8.5–10.1)
Chloride: 108 mmol/L — ABNORMAL HIGH (ref 98–107)
Co2: 25 mmol/L (ref 21–32)
Creatinine: 1.13 mg/dL (ref 0.60–1.30)
EGFR (African American): >60
EGFR (Non-African Amer.): >60
Glucose: 164 mg/dL — ABNORMAL HIGH (ref 65–99)
Osmolality: 278 (ref 275–301)
Potassium: 3.7 mmol/L (ref 3.5–5.1)
SGOT(AST): 25 U/L (ref 15–37)
SGPT (ALT): 32 U/L (ref 12–78)
Sodium: 138 mmol/L (ref 136–145)
Total Protein: 6.2 g/dL — ABNORMAL LOW (ref 6.4–8.2)

## 2013-05-21 LAB — LIPID PANEL
Cholesterol: 133 mg/dL (ref 0–200)
HDL Cholesterol: 40 mg/dL (ref 40–60)
Ldl Cholesterol, Calc: 37 mg/dL (ref 0–100)
Triglycerides: 281 mg/dL — ABNORMAL HIGH (ref 0–200)
VLDL Cholesterol, Calc: 56 mg/dL — ABNORMAL HIGH (ref 5–40)

## 2013-05-21 LAB — TSH: Thyroid Stimulating Horm: 0.242 u[IU]/mL — ABNORMAL LOW

## 2013-05-21 LAB — PHOSPHORUS: Phosphorus: 1.4 mg/dL — ABNORMAL LOW (ref 2.5–4.9)

## 2013-05-21 LAB — MAGNESIUM: Magnesium: 1.9 mg/dL

## 2013-05-21 LAB — HEMOGLOBIN A1C: Hemoglobin A1C: 7.4 % — ABNORMAL HIGH (ref 4.2–6.3)

## 2013-05-23 LAB — BASIC METABOLIC PANEL
Anion Gap: 6 — ABNORMAL LOW (ref 7–16)
BUN: 5 mg/dL — ABNORMAL LOW (ref 7–18)
Calcium, Total: 8.5 mg/dL (ref 8.5–10.1)
Chloride: 109 mmol/L — ABNORMAL HIGH (ref 98–107)
Co2: 27 mmol/L (ref 21–32)
Creatinine: 0.93 mg/dL (ref 0.60–1.30)
EGFR (African American): >60
EGFR (Non-African Amer.): >60
Glucose: 151 mg/dL — ABNORMAL HIGH (ref 65–99)
Osmolality: 283 (ref 275–301)
Potassium: 3.8 mmol/L (ref 3.5–5.1)
Sodium: 142 mmol/L (ref 136–145)

## 2013-05-23 LAB — CBC WITH DIFFERENTIAL/PLATELET
Basophil #: 0 10*3/uL (ref 0.0–0.1)
Basophil %: 0.5 %
Eosinophil #: 0.2 10*3/uL (ref 0.0–0.7)
Eosinophil %: 3.4 %
HCT: 38.5 % — ABNORMAL LOW (ref 40.0–52.0)
HGB: 13.4 g/dL (ref 13.0–18.0)
Lymphocyte #: 1.7 10*3/uL (ref 1.0–3.6)
Lymphocyte %: 24.6 %
MCH: 30.2 pg (ref 26.0–34.0)
MCHC: 34.9 g/dL (ref 32.0–36.0)
MCV: 86 fL (ref 80–100)
Monocyte #: 0.4 x10 3/mm (ref 0.2–1.0)
Monocyte %: 5.2 %
Neutrophil #: 4.7 10*3/uL (ref 1.4–6.5)
Neutrophil %: 66.3 %
Platelet: 153 10*3/uL (ref 150–440)
RBC: 4.46 10*6/uL (ref 4.40–5.90)
RDW: 12.7 % (ref 11.5–14.5)
WBC: 7.1 10*3/uL (ref 3.8–10.6)

## 2013-05-23 LAB — PHOSPHORUS: Phosphorus: 1.6 mg/dL — ABNORMAL LOW (ref 2.5–4.9)

## 2013-05-23 LAB — MAGNESIUM: Magnesium: 1.8 mg/dL

## 2013-05-24 LAB — MAGNESIUM: Magnesium: 1.8 mg/dL

## 2013-05-24 LAB — PHOSPHORUS: Phosphorus: 3.2 mg/dL (ref 2.5–4.9)

## 2013-05-25 LAB — CULTURE, BLOOD (SINGLE)

## 2013-05-27 LAB — CREATININE, SERUM
Creatinine: 0.98 mg/dL (ref 0.60–1.30)
EGFR (African American): >60
EGFR (Non-African Amer.): >60

## 2013-05-27 LAB — T4, FREE: Free Thyroxine: 1.49 ng/dL — ABNORMAL HIGH (ref 0.76–1.46)

## 2013-05-28 ENCOUNTER — Inpatient Hospital Stay: Payer: Self-pay | Admitting: Psychiatry

## 2014-03-25 DIAGNOSIS — Z8619 Personal history of other infectious and parasitic diseases: Secondary | ICD-10-CM | POA: Insufficient documentation

## 2014-05-01 ENCOUNTER — Other Ambulatory Visit: Payer: Self-pay | Admitting: Neurosurgery

## 2014-05-01 DIAGNOSIS — M5136 Other intervertebral disc degeneration, lumbar region: Secondary | ICD-10-CM

## 2014-05-02 NOTE — Op Note (Signed)
PATIENT NAME:  Erik Martinez, Erik Martinez MR#:  [id number] DATE OF BIRTH:  29-Jan-1955  DATE OF PROCEDURE:  10/11/2012  PREOPERATIVE DIAGNOSIS: Upper lip mucocele.   POSTOPERATIVE DIAGNOSIS: Upper lip mucocele.   PROCEDURE PERFORMED: Excision of mucocele.   SURGEON: Roena Malady, M.D.   OPERATIVE FINDINGS: Approximately 0.5 x 0.5 cm mucocele in the submucosa, of the left upper lip.   DESCRIPTION OF PROCEDURE: Boaz was identified in the holding area, taken to the operating room and placed in the supine position. After general endotracheal anesthesia was induced, the upper lip was examined. There was an obvious mucocele in the submucosa of the left upper lip. A local anesthetic of 1% lidocaine with 1:100,000 epinephrine was used to inject overlying mucocele. A total of 1 mL was used. A 15 blade was used to incise down through the mucosa. The mucocele was easily identified. This was extracted and excised. With the mucocele removed, examination of the area showed no other firm masses within the submucosal area. With the mass removed, the wound was then closed using interrupted 4-0 chromic. With this completed and no active bleeding, the patient was then returned to anesthesia where Dr. Rexene Edison was prepared to perform excision of lipomas.   CULTURES: None.   SPECIMENS: Upper lip mucocele.   ESTIMATED BLOOD LOSS: Less than 5 mL.  ____________________________ Roena Malady, MD ctm:sb D: 10/11/2012 07:45:09 ET T: 10/11/2012 08:39:51 ET JOB#: [id number]  cc: Roena Malady, MD, <Dictator> Roena Malady MD ELECTRONICALLY SIGNED 10/23/2012 9:44

## 2014-05-02 NOTE — Op Note (Signed)
PATIENT NAME:  Erik Martinez, Erik Martinez MR#:  [id number] DATE OF BIRTH:  1955-01-21  DATE OF PROCEDURE:  10/11/2012  ATTENDING PHYSICIAN: Harrell Gave A. Ghislaine Harcum, MD  PREOPERATIVE DIAGNOSIS: Left axillary and right supraclavicular lipoma.  POSTOPERATIVE Left axillary and right supraclavicular lipoma.  PROCEDURE PERFORMED: Excision of approximately 3 x 2 cm right axillary lipoma and approximately 2 x 2 cm supraclavicular lipoma.  ANESTHESIA: General.   ESTIMATED BLOOD LOSS: 15 mL.   COMPLICATIONS: None.   SPECIMEN: Lipomas.   INDICATION FOR SURGERY: As follows: Erik Martinez is a pleasant 59 year old male who presented with bothersome supraclavicular and right axillary nodules. He was thus brought to the operating room suite for excision of multiple lipomas.   DETAILS OF PROCEDURE: As follows: Erik Martinez was brought to the operating room suite. He was induced, endotracheal tube was placed, general anesthesia was administered. He then had a lip mucocele excised by Dr. Tami Ribas, to be dictated separately. I then prepped his right shoulder in the standard surgical fashion. A timeout was then performed, correctly identifying patient name, operative site and procedure to be performed. The incision was made lengthwise over the area of the lipoma. It was somewhat hard to palpate with the patient asleep. I then deepened this to the platysma. I was not able to feel any obvious lipoma superior to the platysma, so therefore I went deep to the platysma. I did find some areas of some enlarged lymph nodes, which I thought initially may be actually what was bothering him, and those were excised. I then bluntly found the lipoma, which was excised in its entirety. All dissection was using blunt to avoid any nerve or adjacent blood vessel damage. The wound was then closed in 3 layers, a running 3-0 Vicryl for the platysma and a running 3-0 deep dermal and a 4-0 Monocryl subcuticular for the skin. Dermabond was then placed over  the wound.   His left axilla was then prepped and draped in standard surgical fashion. A timeout was then performed, correctly identifying the patient name, operative site and procedure to be performed. An incision was made over the lipoma. It was encountered immediately. An ellipse of skin was brought out over the lipoma, and the lipoma was removed in its entirety. The wound was then irrigated with sterile saline to insure hemostasis. It was then closed in 2 layers, a deep dermal 3-0 Vicryl and a running 4-0 Monocryl subcuticular. Because of the movement of this area, Steri-Strips, Telfa gauze and Tegaderm were then placed over the wound. The patient was then awoken, extubated and brought to the postanesthesia care unit.   There did appear to be a small amount of air in the subcutaneous tissue around his supraclavicular lipoma excision. A postoperative chest x-ray was then taken to insure that there was no pneumothorax, even though I could not imagine that I was that deep getting out the lipoma, but it was possible he could have also gotten a pneumothorax from anesthesia. There was no obvious pneumothorax. He was then discharged in satisfactory condition. There were no immediate complications. Needle, sponge and instrument counts were correct at the end of the procedure.   ____________________________ Glena Norfolk. Mehgan Santmyer, MD cal:OSi D: 10/11/2012 12:15:28 ET T: 10/11/2012 12:32:18 ET JOB#: [id number]  cc: Harrell Gave A. Sharan Mcenaney, MD, <Dictator> Floyde Parkins MD ELECTRONICALLY SIGNED 10/15/2012 8:52

## 2014-05-03 NOTE — Discharge Summary (Signed)
PATIENT NAME:  Erik Martinez, Erik Martinez MR#:  [id number] DATE OF BIRTH:  08-18-1955  DATE OF ADMISSION:  05/20/2013 DATE OF DISCHARGE:  05/28/2013  ADMITTING PHYSICIAN: Dr. Margaretmary Martinez.  DISCHARGING PHYSICIAN: Erik Lighter, MD  PRIMARY CARE PHYSICIAN: Dr. Lovie Martinez.   CONSULTATIONS IN THE HOSPITAL:  1. Pulmonary care critical care consultation by Dr. Raul Martinez.  2. Psychiatric consultation by Dr.  Bary Martinez.   DISCHARGE DIAGNOSES: 1. Acute respiratory failure secondary to drug overdose.  2. Metabolic encephalopathy.  3. Major depression.  4. Suicidal ideation with drug overdose.  5. Diabetes mellitus.  6. Aspiration pneumonia.  7. Chronic bladder dysfunction status post bladder stimulator.   DISCHARGE HOME MEDICATIONS:  1. Lexapro 30 mg daily.  2. Promethazine 25 mg q.6 hours p.r.n. for nausea and vomiting.  3. Olanzapine 10 mg a daily.  4. Ventolin inhaler 2 puffs 3 times a day as needed for shortness of breath.  5. OxyContin 40 mg twice a day.  6. Oxycodone 5 mg q.4h. p.r.n. for pain.  7. Metformin 500 mg p.o. b.i.d.  8. Augmentin 875 mg p.o. b.i.d. for three more days.  9. Protonix 40 mg p.o. daily.   DISCHARGE DIET: ADA 1800 calorie diet.   DISCHARGE ACTIVITY: As tolerated.    FOLLOWUP INSTRUCTIONS: The patient will be discharged to behavioral medicine unit at [facility].   If any problems with urinary retention, call [facility] in [place] during working hours and they will arrange for checking the bladder stimulator device.   LABS AND IMAGING STUDIES PRIOR TO DISCHARGE: WBC 7.1, hemoglobin 13.4, hematocrit 38.2, platelet count 153.   Sodium 142, , potassium 3.8, chloride 109, bicarbonate 27, BUN 5, creatinine 0.93, and glucose 151 and calcium of 8.5.   BRIEF HOSPITAL COURSE: For more details please look at the history and physical dictated by Dr. Margaretmary Martinez on 05/24/2013 and also the interim summary dictated by Dr. Anselm Martinez on 05/27/2013. In brief, Erik Martinez is a 59 year old  male with history of obstructive sleep apnea, asthma, low back pain,  hepatitis, chronic pain medications, depression, was found in response. Was noted to be in acute respiratory failure. Was intubated, now is on vent.  Failed extubation, had to be  reintubated, was on antibiotics for aspiration pneumonia and finish up the antibiotics in three more days. The patient was seen by psych and is being discharged to [facility] Medicine unit for further treatment.   He has chronic bladder dysfunction bladder, had bladder  stimulator placed in [place];  however post extubation once the Foley catheter was taken out he was having trouble urinating, so likely from being on sedation. Foley catheter was put back in and then taken out for trial of voiding while awaiting for the urologist to get the stimulator checked. However, the patient was able to void okay, so he is being discharged to [facility] Medicine unit and he will have his stimulator interrogated after transfer over there. His course has been otherwise uneventful.   DISCHARGE CONDITION: Stable.   DISCHARGE DISPOSITION: To Behavioral Medicine unit at [facility].   TIME SPENT ON DISCHARGE: 40 minutes.   ____________________________ Erik Lighter, MD rk:sg D: 05/29/2013 13:09:15 ET T: 05/29/2013 13:41:10 ET JOB#: [id number]  cc: Erik Lighter, MD, <Dictator> Erik Lighter MD ELECTRONICALLY SIGNED 06/06/2013 11:53

## 2014-05-03 NOTE — Consult Note (Signed)
Brief Consult Note: Diagnosis: S/P overdose.   Comments: Patient is intubated. Please call when ready for interview.  Electronic Signatures: Orson Slick (MD)  (Signed 11-May-15 17:59)  Authored: Brief Consult Note   Last Updated: 11-May-15 17:59 by Orson Slick (MD)

## 2014-05-03 NOTE — H&P (Signed)
PATIENT NAME:  Erik Martinez, Erik Martinez MR#:  [id number] DATE OF BIRTH:  Apr 04, 1955  DATE OF ADMISSION:  05/28/2013  IDENTIFYING INFORMATION AND CHIEF COMPLAINT: A 59 year old man with a history of depression and chronic pain transferred from the medical service after a suicide attempt.   CHIEF COMPLAINT: "I did something stupid."   HISTORY OF PRESENT ILLNESS: Information obtained from the patient and the chart. The patient was admitted to the hospital on May 11th after taking a nearly fatal overdose of narcotics. History suggests that he planned it out, called his family, notified people, and then took a very large and potentially fatal dose of narcotic pain medicine. The patient now says that although he made a serious attempt at that time he had not been planning it out for long but was overwhelmed and felt hopeless on that particular day. He admits, however, that he had been depressed for a long time prior to that. He has chronic pain problems and also severe financial problems. Felt that he could not pay his bills anymore and was emotionally overwhelmed. He reports low energy, negative thinking, chronic poor sleep, chronic negative thoughts, and recent suicidal ideation. He does not report any manic symptoms and does not report any psychotic symptoms. He was on the medical service and intubated for several days but now is medically returning to his baseline state. He has been continued on Lexapro which is his outpatient antidepressant. For some reason, he was started on Zyprexa as well while he was on medicine.   PAST PSYCHIATRIC HISTORY: No previous hospitalizations. Denies previous suicide attempts. Has been treated by the pain clinic he goes to for his depression, getting Lexapro 30 mg a day. Says he has never been on any other antidepressants in the past. No history of bipolar disorder. Denies any history of substance abuse.   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 He takes rather high doses of narcotics on a regular basis from the pain clinic at [facility]. He also has COPD and fairly mild diabetes.  SOCIAL HISTORY: Lives with his wife who is also disabled. Has adult children.   FAMILY HISTORY: Denies any family history of mental illness.   SUBSTANCE ABUSE HISTORY: Denies any alcohol or drug abuse history or history of misuse of medicine.   REVIEW OF SYSTEMS:  Complains of pain in his hips, knee, and back. Complains still of feeling somewhat depressed but denies suicidal ideation. No shortness of breath. No GI complaints.   CURRENT MEDICATIONS: Lexapro 30 mg p.o. daily, OxyContin 80 mg 3 times a day with oxycodone 30 mg q. 3-4 hours. p.r.n. for pain, Xanax 1 mg 4 times a day, pantoprazole 40 mg a day, Phenergan 25 mg q. 6 p.r.n. nausea, Ambien 10 mg at night, metformin 250 mg twice a day, albuterol inhaler p.r.n. for shortness of breath.   ALLERGIES: CODEINE AND MORPHINE, ALTHOUGH HOW THAT SQUARES WITH THE FACT THAT HE HAS BEEN ON MORPHINE SULFATE AT HOME, I DO NOT KNOW.   MENTAL STATUS EXAMINATION:  A somewhat disheveled gentleman, looks older than his stated age, cooperative with the interview. Eye contact good. Psychomotor activity slow. Speech slow and decreased in total amount. Affect blunted and flat. Mood stated as being better. Thoughts are lucid without loosening of associations or delusions. Denies auditory or visual hallucinations. Denies any suicidal or homicidal ideation. Judgment and insight improving. Intelligence normal. Alert and  oriented x 4. Short and long-term memory intact.   PHYSICAL EXAMINATION:  GENERAL: The patient walks with a walker. It looks like he is in pain. Multiple tattoos, but no acute skin lesions.  HEENT: Pupils equal and reactive. Face symmetric. Oral mucosa dry. NECK: Nontender  and flexible.  BACK: Tender in the lower area.  EXTREMITIES: Patient has  decreased range of motion in all extremities, although the upper extremities are better than the lower ones. His gait is influenced by his pain and he requires a walker to move. He has decreased strength throughout all 4 areas, legs worse than arms. NEUROLOGICAL:  Reflexes normal. Cranial nerves symmetric and normal.  LUNGS: Clear with no wheezes.  HEART: Regular rate and rhythm.  ABDOMEN: Soft, nontender, normal bowel sounds.  CURRENT VITAL SIGNS: Show a blood pressure 136/80, respirations 16, pulse 85, temperature 99.1.   LABORATORY RESULTS: He has only had blood sugars done today and they are all in the mid-100s.   ASSESSMENT: A 59 year old man who made a serious suicide attempt with a history of major depression, although no prior hospitalizations. Currently denying suicidal ideation, but still looking pretty depressed. No sign of psychosis. It is not clear why he was started on Zyprexa on the medical service and less was for the delirium related to his detoxification and intubation.   TREATMENT PLAN: I do not see the need for the Zyprexa. Continue the Lexapro 30 mg a day, but it clearly has been inadequate for treatment of his depression. I propose adding Wellbutrin sustained-release 150 mg to start with in the morning. Continue his other medicines including his usual doses of outpatient pain medicine. Engage him in groups and activities on the unit. Try and get collateral history. Engage the family in treatment. I think he needs to be referred to see an actual mental health provider at discharge.   DIAGNOSIS, PRINCIPAL AND PRIMARY:  AXIS I: Major depression, severe, single episode.   SECONDARY DIAGNOSES:  AXIS I: No further.  AXIS II: Deferred.  AXIS III: Chronic pain from orthopedic problems, mild diabetes, mild asthma, has a bladder with decreased motility and requires an interstitial stimulator for his bladder.  AXIS IV: Severe from multiple financial problems and recent suicide attempt.   AXIS V: Functioning at time of evaluation 1.   ____________________________ Gonzella Lex, MD jtc:dd D: 05/28/2013 19:17:26 ET T: 05/28/2013 19:47:28 ET JOB#: [id number]  cc: Gonzella Lex, MD, <Dictator> Gonzella Lex MD ELECTRONICALLY SIGNED 06/13/2013 11:38

## 2014-05-03 NOTE — Consult Note (Signed)
Brief Consult Note: Diagnosis: Major depressive disorder, Opioid dependence, S/P narcotic overdose.   Patient was seen by consultant.   Consult note dictated.   Recommend further assessment or treatment.   Comments: Mr. simkin has a h/o depression and chronic pain. He overdosed on pain killers in the context of financial stress.   PLAN: 1. The patient is on IVC.  2. NEW RULES: The patient has a Foley. This is not allowed in psychiatry. He may be transferred to [place] once his bladder stimulator is fixed.   3. Please continue Lexapro and Zyprexa.  4. No Xanax please.  5. I will follow along.  Electronic Signatures: Orson Slick (MD)  (Signed 16-May-15 11:28)  Authored: Brief Consult Note   Last Updated: 16-May-15 11:28 by Orson Slick (MD)

## 2014-05-03 NOTE — Consult Note (Signed)
Brief Consult Note: Comments: Pt is on Vent. Please call again when extubated.  Electronic Signatures: Jeronimo Norma (MD)  (Signed 12-May-15 14:30)  Authored: Brief Consult Note   Last Updated: 12-May-15 14:30 by Jeronimo Norma (MD)

## 2014-05-03 NOTE — Consult Note (Signed)
Brief Consult Note: Diagnosis: Major depressive disorder, Opioid dependence, S/P narcotic overdose.   Patient was seen by consultant.   Consult note dictated.   Recommend further assessment or treatment.   Orders entered.   Comments: Mr. Theisen has a h/o depression and chronic pain. He overdosed on pain killers in the context of financial stress. He is still on medical floor awaiting a visit from the bladder stimulator maker. He will be transferred to psychiatry.  MSE: Alert, oriented, pleasant, good eye contact, adequate grooming, normal speech. "so so" mood, flat affect. Logical, denies suicidal ideation and is able to CFS in the hospital. Intact cognition. Noermal intelligence and fund of knowledge. Good insight and judgment.   PLAN: 1. The patient no longre meets criteria for IVC. I will terminate proceedings.   2. NEW RULES: The patient has a Foley. This is not allowed in psychiatry. He may be transferred to [place] once his bladder stimulator is fixed.   3. Please continue Lexapro and Zyprexa.  4. No Xanax please.  5. I will follow along.  Electronic Signatures: Orson Slick (MD)  (Signed 18-May-15 14:04)  Authored: Brief Consult Note   Last Updated: 18-May-15 14:04 by Orson Slick (MD)

## 2014-05-03 NOTE — H&P (Signed)
PATIENT NAME:  Erik Martinez, Erik Martinez MR#:  [id number] DATE OF BIRTH:  [birth year]  DATE OF ADMISSION:  05/20/2013  PRIMARY CARE PHYSICIAN: Dr. Juluis Pitch.  REFERRING EMERGENCY ROOM PHYSICIAN: Dr. Cheri Guppy.   CHIEF COMPLAINT: Unresponsiveness.   HISTORY OF PRESENT ILLNESS: The patient is a 59 year old male with past medical history of obstructive sleep apnea, hepatitis, asthma, low back pain, status post back surgery, GERD, and depression who was found to be unresponsive by his wife at around midnight last night. The patient actually texted his 67 year old son that "Douglas you don't hate me for what I'm going to do, bye-bye". The son actually did not understand what he meant. The patient also said "I love you" to his wife in the evening and when they asked him why he is saying that suddenly he just smiled and did not reply. Following that, around midnight, wife found him unresponsive, called EMS immediately. By the time EMS arrived, the patient was cyanotic and the patient was breathing 2-3 per minute. They gave 1 dose of Narcan following which, his respiratory rate increased to 8-10 per minute. The patient was satting 88% on 4 liters. Another dose of Narcan was given with no persistent improvement in his respiration. The patient was brought into the ER. Another 2 mg of IV Narcan was given in the ER at around 12:15 a.m. As patient was not maintaining his airway, prophylactic intubation was done at around 1:30 a.m. by the ER physician. The patient was subsequently started on Narcan drip, but as he was agitated and trying to pull the tubes and gagging, Narcan drip was stopped and propofol drip was started. According to the ER physician's notes, EMS has found 4 empty bottles next to him. They have noticed oxycodone 30 mg which was filled on 05/04/2013 quantity 270, Morphine Sulfate Contin 100 mg which was filled on 04/25 with a quantity of 90, Ambien #30 which was filled on 04/28/2013 number 30, and alprazolam 1 mg 20  tablets which was filled on April 17. The patient has received 1 liter fluid bolus and currently he is getting another liter of fluid bolus. When the EMS arrived to bring the patient, his Accu-Chek was at around 342. According to the wife, and the patient's parents in the family room, the patient recently bought a new home and he thinks he is under financial stress. Wife is reporting that though they are doing okay, patient was constantly worried and he was thinking that he is under a lot of financial stress. The patient was committed by the ER physician and psychiatric consultation was placed by me. I have discussed the patient's situation with the patient's wife and parents in the family consulting room. They are all aware of the difficult situation of the patient. In the interim, we are trying to reach Poison Control. The patient's wife reported that he sees a psychiatrist in [place] on a regular basis regarding his depression. No severe complaints of suicidal attempts in the past.   PAST MEDICAL HISTORY: Chronic low back pain, GERD, obstructive sleep apnea, hepatitis, diabetes mellitus, bladder stimulator, depression.   PAST SURGICAL HISTORY: Low back surgery, knee surgery, hip replacement.   ALLERGIES: CODEINE AND MORPHINE, BUT THE PATIENT IS ON OXYCODONE AND MORPHINE SULFATE CONTIN AT HOME.   PSYCHOSOCIAL HISTORY: Lives at home with wife. According to the wife, he does not have any history of smoking alcohol or intravenous drug use.   FAMILY HISTORY: Mom has history of throat cancer.   REVIEW OF  SYSTEMS: Unobtainable as the patient is sedated and intubated.   HOME MEDICATIONS: 1. Xanax 1 mg 1 tablet p.o. 4 times a day. 2. Ventolin 2 puffs inhalations 2 times a day. 3. Promethazine 25 mg 1 tablet p.o. every 6 hours. 4. OxyContin 80 mg 1 tablet p.o. 3 times a day. 5. Oxycodone 50 mg 1 tablets every 6 hours. 6. Olanzapine 10 mg 1 tablet p.o. once a day. 7. Metformin 250 mg 2 times a day. 8.  Lexapro 20 mg 1-1/2 tablets orally once a day. 9. Ambien CR 12.5 mg 1 tablet p.o. once a day.   PHYSICAL EXAMINATION:  VITAL SIGNS: Temperature 98 degrees Fahrenheit, pulse initially 96 but during my examination it was at 67, respirations initially before intubation 13 after intubation 20, blood pressure 106/70, pulse oximetry 98% on mechanical ventilation mode.  GENERALLY: The patient is not in any acute distress. Moderately built and obese.  HEENT: Normocephalic, atraumatic. Pupils are sluggishly reactive to light and accommodation. Dry mucous membranes, ET tube is intact, NG tube is intact. The patient is hooked up to a ventilator.  NECK: Supple. No JVD. No thyromegaly.  LUNGS: Moderate air entry distant breath sounds. No crackles. No rales or rhonchi.  CARDIAC: S1 and S2 normal. Regular rate and rhythm. No murmurs.  GASTROINTESTINAL: Soft, obese, bowel sounds are positive in all 4 quadrants. Nontender, nondistended. No masses felt. NEUROLOGIC: The patient is sedated on propofol as he is intubated. Reflexes are 2+.  EXTREMITIES: No edema, cyanosis, no clubbing, but multiple tattoos are noticed on his extremities. PSYCHIATRIC: Mood and affect could not be elicited as the patient is sedated.   LABORATORIES AND IMAGING STUDIES: Chest x-ray revealed portable, shallow inspiration with probably infiltration in the left lung base. Repeat chest x-ray 1 view after intubation. Endotracheal tube tip measures 7.6 cm above the carina  tube tip present into the left upper quadrant.  Chem-8: Glucose is 303, BUN 18, creatinine 1.40. Sodium, potassium, chloride, and CO2 are normal. GFR 56. Anion gap, serum osmolality and calcium are normal. Serum alcohol level is less than 3. LFTs are normal except AST at 50. Troponin less than 0.02. WBC 12.1. Hemoglobin, hematocrit, and platelet counts are normal. Acetaminophen level less than 2, salicylate less than 1.7. Venous gas pH 7.25, pCO2 of 67. Repeat blood gas ABG has  revealed a pH 7.34, pCO2 of 48, pO2 of 88, and FiO2 at 40%, bicarbonate is 25.9. Lactic acid is elevated at 2.5. The patient is on [facility] mode with a rate 20, PEEP of 5, tidal volume.  ASSESSMENT AND PLAN: A 59 year old male was found unresponsive by his wife at around midnight and called EMS immediately. EMS has noticed empty pill bottles lying next to him and patient texted suicidal note to his son prior to this incident.   ASSESSMENT: Altered mental status/unresponsiveness secondary to intentional drug overdose from unknown amount of medications: Oxycodone, Morphine Sulfate Contin, Ambien, and alprazolam.  PLAN:  1. Will admit him to CCU. The patient is intubated prophylactically. Continue NG tube. Provide IV fluids. He was consulted by the ER physician and will put in a psychiatric consult.  2. He had possible aspiration pneumonia. Blood cultures were ordered, and the patient will be started on IV Zosyn and Levaquin prophylactically.  3. Prophylactic intubation to secure the airway. Will put in a pulmonology consult and repeat gas will be done in a.m. 4. History of diabetes mellitus. Currently, the patient is n.p.o. as he is sedated and intubated. We will  provide him insulin sliding scale.  5. Gastroesophageal reflux disease. The patient will be on IV Protonix. 6. Chronic low back pain. 7. Currently the patient is sedated on propofol. So will hold off on his home medication. Also, the patient had overdosed himself on narcotic medication, so will hold off on narcotics.  8. History of hepatitis. LFTs are in the normal range.  9. Chronic history of depression with current suicidal attempt. 10. Psych consult is placed. Will hold off all his p.o. medications. We will provide him GI and DVT prophylaxis.   CODE STATUS: The patient is full code.   Wife and mom are power of attorney. Diagnosis and plan of care was discussed in detail with the patient's wife and parents in the family consultations room.  The verbalized understanding of the plan and the patient's critical situation.  TOTAL CRITICAL CARE TIME SPENT: 60 minutes.     ____________________________ Nicholes Mango, MD ag:lt D: 05/20/2013 03:09:53 ET T: 05/20/2013 06:55:52 ET JOB#: [id number]  cc: Nicholes Mango, MD, <Dictator> Nicholes Mango MD ELECTRONICALLY SIGNED 06/04/2013 1:49

## 2014-05-03 NOTE — Consult Note (Signed)
PATIENT NAME:  TZVI, ECONOMOU MR#:  [id number] DATE OF BIRTH:  [birth year]  DATE OF CONSULTATION:  05/24/2013  REFERRING PHYSICIAN:  Demetrios Loll, MD CONSULTING PHYSICIAN:  Charleigh Correnti B. Luwanna Brossman, MD  REASON FOR CONSULTATION: To evaluate the patient after a suicide attempt.   IDENTIFYING DATA: Mr. Serpa is a 59 year old male with history of depression and chronic pain.   CHIEF COMPLAINT: "I have too much stress."   HISTORY OF PRESENT ILLNESS: Mr. Toops has been taking pain killers for the last 5 or 6 years. He goes to pain clinic where he is prescribed oxycodone and OxyContin. He also gets Lexapro and Xanax from a separate physician at the same clinic. He has multiple financial problems. He owns 2 houses. One is already in foreclosure, and he is trying his best to keep his other house a float. He reports that on the day of overdose, all of a sudden he felt overwhelmed and decided to end his life. He texted his son. Was particularly tender towards his wife and then proceeded to take medications. His wife noted that the patient was unresponsive. He was brought to the hospital. Did not respond enough to Narcan and had to be intubated. Today, he was supposed to be extubated as planned but instead this morning the patient became confused, agitated and pulled his tube out. I saw him initially in the morning, but he was still somewhat confused and not fully able to participate in the interview. In the afternoon, he is alert, oriented, much better put together and ready to talk to me. He is glad to be alive. He decided to press on, work with his family and wife trying to preserve what can be preserved of their estate. He also decided that maybe it is time to taper off of his pain medication. He was unclear why he would want to do it. Apparently, he has been with his pain doctor for at least 5 or 6 years. He has not been asking for increase in the dose of medication and reports that medications are very helpful and  he has only minimal pain. The idea to stop his medications completely is rather strange. Makes me think that maybe the patient does get in trouble misusing his medicines. He at the moment endorses some symptoms of depression with poor sleep, decreased appetite, anhedonia, excessive worries, poor energy and concentration, social isolation, some crying that culminated in a suicide attempt. He already feels better. He explains to me that the fact that he pulled out his tube means that he wants to live because when he woke up this morning in the CCU, he did not know where he was and what was going on around him and he felt that he is suffocating. He denies any thoughts of hurting himself or others at the moment. He denies psychotic symptoms, although this morning he was confused, possibly hallucinating and agitated. There are no symptoms suggestive of bipolar mania. He denies alcohol or illicit substance use.   PAST PSYCHIATRIC HISTORY: He never attempted suicide before. Has never been hospitalized. His antidepressants and Xanax are prescribed by pain clinic.   FAMILY PSYCHIATRIC HISTORY: None reported.   PAST MEDICAL HISTORY: Status post multiple surgeries and multiple parts replaced, chronic back pain, GERD, sleep apnea, hepatitis, diabetes, bladder stimulator.   ALLERGIES: CODEINE AND MORPHINE.   MEDICATIONS ON ADMISSION: Xanax 1 mg 4 times daily, Ventolin 2 puffs twice daily, promethazine 25 mg every 6 hours as needed, OxyContin 80 mg 3 times  daily, oxycodone 15 mg every 6 hours, Zyprexa 10 mg at bedtime, metformin 250 mg twice daily, Lexapro 30 mg daily, Ambien CR 12.5 mg daily.   MEDICATIONS AT THE TIME OF CONSULTATION: Lovenox, sliding scale insulin, Zofran as needed, Zosyn every 8 hours 3.375, Lexapro 30 mg daily, Zyprexa 10 mg daily, pantoprazole 40 mg daily, Toradol injections as needed, lorazepam as needed.   SOCIAL HISTORY: He lives with his wife. He has been disabled for many years. He has  financial difficulties. His mother was also at his bedside earlier today.   REVIEW OF SYSTEMS:   CONSTITUTIONAL: No fevers or chills. Positive for fatigue.  EYES: No double or blurred vision.  ENT: No hearing loss.  RESPIRATORY: Some cough and pain after self-extubation.  CARDIOVASCULAR: No chest pain or orthopnea.  GASTROINTESTINAL: No abdominal pain, nausea, vomiting or diarrhea.  GENITOURINARY: No incontinence or frequency.  ENDOCRINE: No heat or cold intolerance.  LYMPHATIC: No anemia or easy bruising.  INTEGUMENTARY: No acne or rash.  MUSCULOSKELETAL: No muscle or joint pain.  NEUROLOGIC: No tingling or weakness.  PSYCHIATRIC: See history of present illness for details.   PHYSICAL EXAMINATION:  VITAL SIGNS: Blood pressure 174/89, pulse 106, temperature 98.5.  GENERAL: This is a well-developed, middle-aged male in no acute distress.   The rest of the physical examination is deferred to his primary attending.   LABORATORY DATA: Chemistries are within normal limits with blood glucose of 151. LFTs within normal limits. TSH 0.242. Urine tox screen is positive for benzodiazepines and opioids. CBC within normal limits except for elevated white blood count. Urinalysis is not suggestive of urinary tract infection. Serum acetaminophen and salicylates are low.   MENTAL STATUS EXAMINATION: The patient is alert and oriented to person, place, time and situation. He is pleasant, polite and cooperative. He is adequately groomed, wearing hospital scrubs. He maintains good eye contact. His speech is soft and rather slow. Mood is okay with flat affect. Thought process is logical and goal oriented. He denies suicidal or homicidal ideation but was admitted to the hospital after a suicide attempt by opioid overdose. He denies delusions or paranoia. There are no auditory or visual hallucinations. His cognition is grossly intact. He registers 3 out of 3 and recalls 3 out of 3 objects after 5 minutes. He can  spell "world" forwards and backwards. He knows the current Software engineer. His long- and short-term memory is intact. He is of normal intelligence and fund of knowledge. Insight and judgment are questionable.   DIAGNOSES:  AXIS I: Major depressive disorder, recurrent, severe, opioid dependence.  AXIS II: Deferred.  AXIS III: Chronic pain, gastroesophageal reflux disease, sleep apnea, hepatitis, diabetes.  AXIS IV: Chronic pain, financial, primary support.  AXIS V: Global assessment of functioning 25.   PLAN:  1. The patient is on involuntary inpatient commitment.  2. Please continue Lexapro and Zyprexa.  3. I would agree to discontinue Xanax.  4. The patient will be likely be transferred to psychiatry when medically cleared. I am working this weekend. I will follow up.    ____________________________ Wardell Honour. Bary Leriche, MD jbp:gb D: 05/24/2013 19:03:00 ET T: 05/24/2013 20:59:29 ET JOB#: [id number]  cc: Koni Kannan B. Bary Leriche, MD, <Dictator> Clovis Fredrickson MD ELECTRONICALLY SIGNED 06/06/2013 7:25

## 2014-05-03 NOTE — Discharge Summary (Signed)
PATIENT NAME:  Erik Martinez, Erik Martinez MR#:  [id number] DATE OF BIRTH:  08/11/1955  DATE OF ADMISSION:  05/28/2013 DATE OF DISCHARGE:  06/03/2013  HOSPITAL COURSE: See dictated history and physical for details of admission. This 59 year old man with a history of chronic pain was admitted after making a serious suicide attempt by overdose on prescription narcotics. He gave a history of long-standing depression with recent exacerbation in the context of multiple life stresses. In the hospital, the patient was treated for his depression by continuing his current antidepressant and also starting a new antidepressant, Wellbutrin. He was also included in individual and group daily psychotherapy. The patient was cooperative with treatment and did not display any dangerous behavior in the hospital. He showed improving insight and judgment. He reported that his mood got significantly better. His affect appeared to be calm and appropriate. As far as his pain, I continued him on his outpatient narcotic doses. He takes an extraordinarily large, in my estimation, dose of chronic narcotics, but I confirmed that these are his correct doses, and he was able to tolerate them without any sign of any oversedation or narcotic side effects. His wife felt that, prior to the weekend, he was still not quite "himself" and was uncomfortable with discharge. He was not disagreeable about this and continued to cooperate with treatment. By the time of discharge, he appeared to be stable and showing a consistently positive affect.   MENTAL STATUS EXAMINATION: Casually dressed, reasonably well-groomed gentleman who looks his stated age, cooperative with the interview. Good eye contact. Normal psychomotor activity. Speech normal in rate, tone and volume. Affect reactive, euthymic, appropriate. Mood stated as being good. Thoughts are lucid. No loosening of associations or delusions. Denies auditory or visual hallucinations. Denies suicidal or homicidal  ideation. Shows good insight and judgment, normal intelligence. Alert and oriented x4, short and long-term memory intact.   LABORATORY STUDIES. He had a large number of labs done on the medical service before coming to psychiatry, which can be reviewed from his medical admission. On psychiatry, his blood sugars have been followed closely and have stayed mostly in the low to mid 100s. Creatinine was last checked on admission and was 0.98. Free thyroxine was elevated at 1.49, just slightly above the normal range.   DISCHARGE MEDICATIONS: Oxycodone extended-release 80 mg 3 times a day, oxycodone immediate release 30 mg every four hours as needed for pain, Lexapro 30 mg per day, metformin extended-release 500 mg per day, Phenergan 25 mg every six hours as needed for nausea, alprazolam 1 mg every six hours, Ambien extended-release 12.5 mg at night, albuterol inhaler 2 puffs 4 times a day as needed for shortness of breath, pantoprazole 40 mg once a day, bupropion sustained release 150 mg twice a day.   DISPOSITION: He is being discharged home with his family. He will continue to get follow-up treatment for his chronic pain through the [facility] and [facility]. He has been referred to [facility] for psychiatric follow-up.   DISCHARGE DIAGNOSES, PRINCIPAL AND PRIMARY:  AXIS I: Major depression, severe, recurrent.   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  AXIS II: Deferred.  AXIS III: Chronic pain on high-dose narcotic, chronic obstructive pulmonary disease, gastric reflux symptoms, high blood pressure, mild diabetes.  AXIS IV: Severe from his multiple financial and personal stresses and chronic pain.  AXIS V: Functioning at time of discharge 56.     ____________________________ Gonzella Lex, MD jtc:cg D: 06/03/2013 23:01:13 ET  T: 06/03/2013 23:28:05 ET JOB#: [id number]  cc: Gonzella Lex, MD, <Dictator> Gonzella Lex MD ELECTRONICALLY SIGNED 06/13/2013  11:39

## 2014-05-03 NOTE — Consult Note (Signed)
Brief Consult Note: Comments: Patient had an InterStim placed by Dr. Jacqlyn Larsen in 2007 for chronic urinary frequency.  His last visit with Dr. Jacqlyn Larsen was in 2009.  He will need to be seen in the office for InterStim diagnostics and would recommend an outpatient appointment for further evaluation after discharge.  Electronic Signatures: Abbie Sons (MD)  (Signed 20-May-15 17:40)  Authored: Brief Consult Note   Last Updated: 20-May-15 17:40 by Abbie Sons (MD)

## 2014-05-09 ENCOUNTER — Ambulatory Visit
Admission: RE | Admit: 2014-05-09 | Discharge: 2014-05-09 | Disposition: A | Discharge status: Home / Self Care | Payer: Medicare Other | Source: Ambulatory Visit | Attending: Neurosurgery | Admitting: Neurosurgery

## 2014-05-09 DIAGNOSIS — M5136 Other intervertebral disc degeneration, lumbar region: Secondary | ICD-10-CM

## 2014-05-09 MED ORDER — HYDROMORPHONE HCL 2 MG/ML IJ SOLN
1.5000 mg | Freq: Once | INTRAMUSCULAR | Status: AC
  Administered 2014-05-09: 1.5 mg via INTRAMUSCULAR

## 2014-05-09 MED ORDER — DIPHENHYDRAMINE HCL 50 MG PO CAPS
50.0000 mg | ORAL_CAPSULE | Freq: Four times a day (QID) | ORAL | Status: DC | PRN
Start: 2014-05-09 — End: 2014-05-10
  Administered 2014-05-09: 50 mg via ORAL

## 2014-05-09 MED ORDER — DIAZEPAM 5 MG PO TABS
10.0000 mg | ORAL_TABLET | Freq: Once | ORAL | Status: AC
  Administered 2014-05-09: 10 mg via ORAL

## 2014-05-09 MED ORDER — ONDANSETRON HCL 4 MG/2ML IJ SOLN
4.0000 mg | Freq: Once | INTRAMUSCULAR | Status: AC
  Administered 2014-05-09: 4 mg via INTRAMUSCULAR

## 2014-05-09 MED ORDER — ONDANSETRON HCL 4 MG/2ML IJ SOLN: 4.0000 mg | Freq: Four times a day (QID) | INTRAMUSCULAR | Status: DC | PRN

## 2014-05-09 MED ORDER — IOHEXOL 180 MG/ML  SOLN
15.0000 mL | Freq: Once | INTRAMUSCULAR | Status: AC | PRN
  Administered 2014-05-09: 15 mL via INTRATHECAL

## 2014-05-09 NOTE — Discharge Instructions (Signed)
Myelogram Discharge Instructions  1. Go home and rest quietly for the next 24 hours.  It is important to lie flat for the next 24 hours.  Get up only to go to the restroom.  You may lie in the bed or on a couch on your back, your stomach, your left side or your right side.  You may have one pillow under your head.  You may have pillows between your knees while you are on your side or under your knees while you are on your back.  2. DO NOT drive today.  Recline the seat as far back as it will go, while still wearing your seat belt, on the way home.  3. You may get up to go to the bathroom as needed.  You may sit up for 10 minutes to eat.  You may resume your normal diet and medications unless otherwise indicated.  Drink lots of extra fluids today and tomorrow.  4. The incidence of headache, nausea, or vomiting is about 5% (one in 20 patients).  If you develop a headache, lie flat and drink plenty of fluids until the headache goes away.  Caffeinated beverages may be helpful.  If you develop severe nausea and vomiting or a headache that does not go away with flat bed rest, call [phone].  5. You may resume normal activities after your 24 hours of bed rest is over; however, do not exert yourself strongly or do any heavy lifting tomorrow. If when you get up you have a headache when standing, go back to bed and force fluids for another 24 hours.  6. Call your physician for a follow-up appointment.  The results of your myelogram will be sent directly to your physician by the following day.  7. If you have any questions or if complications develop after you arrive home, please call [phone].  Discharge instructions have been explained to the patient.  The patient, or the person responsible for the patient, fully understands these instructions.        May resume Trazodone, Wellbutrin, Lexapro and Phenergan on May 10, 2014, after 11:00 am.

## 2014-05-09 NOTE — Progress Notes (Signed)
Pt states he has been off Trazodone, Phenergan, Wellbutrin and Lexapro since 05/06/14. Discharge instructions explained to pt.

## 2014-05-12 ENCOUNTER — Ambulatory Visit
Admission: RE | Admit: 2014-05-12 | Discharge: 2014-05-12 | Disposition: A | Discharge status: Home / Self Care | Payer: Medicare Other | Source: Ambulatory Visit | Attending: Neurosurgery | Admitting: Neurosurgery

## 2014-05-12 ENCOUNTER — Other Ambulatory Visit: Payer: Self-pay | Admitting: Neurosurgery

## 2014-05-12 DIAGNOSIS — G971 Other reaction to spinal and lumbar puncture: Secondary | ICD-10-CM

## 2014-05-12 MED ORDER — IOHEXOL 180 MG/ML  SOLN
1.0000 mL | Freq: Once | INTRAMUSCULAR | Status: AC | PRN
  Administered 2014-05-12: 1 mL via EPIDURAL

## 2014-05-12 NOTE — Progress Notes (Signed)
20cc blood drawn from left AC space for epidural blood patch; site unremarkable.  jkl

## 2014-05-12 NOTE — Discharge Instructions (Signed)

## 2014-06-12 ENCOUNTER — Encounter
Admission: RE | Admit: 2014-06-12 | Discharge: 2014-06-12 | Disposition: A | Discharge status: Home / Self Care | Payer: Medicare Other | Source: Ambulatory Visit | Attending: Orthopedic Surgery | Admitting: Orthopedic Surgery

## 2014-06-12 DIAGNOSIS — E119 Type 2 diabetes mellitus without complications: Secondary | ICD-10-CM | POA: Insufficient documentation

## 2014-06-12 DIAGNOSIS — Z01812 Encounter for preprocedural laboratory examination: Secondary | ICD-10-CM | POA: Diagnosis present

## 2014-06-12 DIAGNOSIS — Z01810 Encounter for preprocedural cardiovascular examination: Secondary | ICD-10-CM | POA: Insufficient documentation

## 2014-06-12 DIAGNOSIS — I10 Essential (primary) hypertension: Secondary | ICD-10-CM | POA: Diagnosis not present

## 2014-06-12 HISTORY — DX: Gastro-esophageal reflux disease without esophagitis: K21.9

## 2014-06-12 HISTORY — DX: Type 2 diabetes mellitus without complications: E11.9

## 2014-06-12 HISTORY — DX: Bipolar disorder, unspecified: F31.9

## 2014-06-12 LAB — BASIC METABOLIC PANEL
Anion gap: 7 (ref 5–15)
BUN: 22 mg/dL — ABNORMAL HIGH (ref 6–20)
CO2: 29 mmol/L (ref 22–32)
Calcium: 9.1 mg/dL (ref 8.9–10.3)
Chloride: 103 mmol/L (ref 101–111)
Creatinine, Ser: 0.92 mg/dL (ref 0.61–1.24)
GFR calc Af Amer: >60 mL/min (ref >60)
GFR calc non Af Amer: >60 mL/min (ref >60)
Glucose, Bld: 124 mg/dL — ABNORMAL HIGH (ref 65–99)
Potassium: 4.1 mmol/L (ref 3.5–5.1)
Sodium: 139 mmol/L (ref 135–145)

## 2014-06-12 NOTE — Patient Instructions (Signed)
  Your procedure is scheduled on: 06/18/14 Wed Report to Day Surgery. To find out your arrival time please call [phone] between 1PM - 3PM on 06/17/14.  Remember: Instructions that are not followed completely may result in serious medical risk, up to and including death, or upon the discretion of your surgeon and anesthesiologist your surgery may need to be rescheduled.    __x__ 1. Do not eat food or drink liquids after midnight. No gum chewing or hard candies.     ____ 2. No Alcohol for 24 hours before or after surgery.   ____ 3. Bring all medications with you on the day of surgery if instructed.    __x__ 4. Notify your doctor if there is any change in your medical condition     (cold, fever, infections).     Do not wear jewelry, make-up, hairpins, clips or nail polish.  Do not wear lotions, powders, or perfumes. You may wear deodorant.  Do not shave 48 hours prior to surgery. Men may shave face and neck.  Do not bring valuables to the hospital.    [facility] is not responsible for any belongings or valuables.               Contacts, dentures or bridgework may not be worn into surgery.  Leave your suitcase in the car. After surgery it may be brought to your room.  For patients admitted to the hospital, discharge time is determined by your                treatment team.   Patients discharged the day of surgery will not be allowed to drive home.   Please read over the following fact sheets that you were given:      _x___ Take these medicines the morning of surgery with A SIP OF WATER:    1. Oxycodone  2. Bupropion   3. Escitalopram  4.Protonix  5.  6.  ____ Fleet Enema (as directed)   __x__ Use CHG Soap as directed  ____ Use inhalers on the day of surgery  __x__ Stop metformin 2 days prior to surgery    ____ Take 1/2 of usual insulin dose the night before surgery and none on the morning of surgery.   ____ Stop Coumadin/Plavix/aspirin on   ____ Stop  Anti-inflammatories on    ____ Stop supplements until after surgery.    ____ Bring C-Pap to the hospital.

## 2014-06-13 ENCOUNTER — Ambulatory Visit: Payer: Medicare Other

## 2014-06-18 ENCOUNTER — Encounter: Payer: Self-pay | Admitting: *Deleted

## 2014-06-18 ENCOUNTER — Ambulatory Visit
Admission: RE | Admit: 2014-06-18 | Discharge: 2014-06-18 | Disposition: A | Discharge status: Home / Self Care | Payer: Medicare Other | Source: Ambulatory Visit | Attending: Orthopedic Surgery | Admitting: Orthopedic Surgery

## 2014-06-18 ENCOUNTER — Encounter
Admission: RE | Disposition: A | Discharge status: Home / Self Care | Payer: Self-pay | Source: Ambulatory Visit | Attending: Orthopedic Surgery

## 2014-06-18 ENCOUNTER — Ambulatory Visit: Payer: Medicare Other | Admitting: Anesthesiology

## 2014-06-18 DIAGNOSIS — I2510 Atherosclerotic heart disease of native coronary artery without angina pectoris: Secondary | ICD-10-CM | POA: Diagnosis not present

## 2014-06-18 DIAGNOSIS — K219 Gastro-esophageal reflux disease without esophagitis: Secondary | ICD-10-CM | POA: Diagnosis not present

## 2014-06-18 DIAGNOSIS — Z885 Allergy status to narcotic agent status: Secondary | ICD-10-CM | POA: Insufficient documentation

## 2014-06-18 DIAGNOSIS — Z96612 Presence of left artificial shoulder joint: Secondary | ICD-10-CM | POA: Insufficient documentation

## 2014-06-18 DIAGNOSIS — Z833 Family history of diabetes mellitus: Secondary | ICD-10-CM | POA: Insufficient documentation

## 2014-06-18 DIAGNOSIS — F329 Major depressive disorder, single episode, unspecified: Secondary | ICD-10-CM | POA: Insufficient documentation

## 2014-06-18 DIAGNOSIS — E119 Type 2 diabetes mellitus without complications: Secondary | ICD-10-CM | POA: Insufficient documentation

## 2014-06-18 DIAGNOSIS — Z96642 Presence of left artificial hip joint: Secondary | ICD-10-CM | POA: Diagnosis not present

## 2014-06-18 DIAGNOSIS — Z7982 Long term (current) use of aspirin: Secondary | ICD-10-CM | POA: Diagnosis not present

## 2014-06-18 DIAGNOSIS — F418 Other specified anxiety disorders: Secondary | ICD-10-CM | POA: Diagnosis not present

## 2014-06-18 DIAGNOSIS — Z91048 Other nonmedicinal substance allergy status: Secondary | ICD-10-CM | POA: Diagnosis not present

## 2014-06-18 DIAGNOSIS — M65341 Trigger finger, right ring finger: Secondary | ICD-10-CM | POA: Diagnosis present

## 2014-06-18 DIAGNOSIS — G4700 Insomnia, unspecified: Secondary | ICD-10-CM | POA: Insufficient documentation

## 2014-06-18 DIAGNOSIS — M6530 Trigger finger, unspecified finger: Secondary | ICD-10-CM | POA: Diagnosis present

## 2014-06-18 DIAGNOSIS — M65321 Trigger finger, right index finger: Secondary | ICD-10-CM | POA: Diagnosis not present

## 2014-06-18 DIAGNOSIS — Z79899 Other long term (current) drug therapy: Secondary | ICD-10-CM | POA: Insufficient documentation

## 2014-06-18 DIAGNOSIS — Z8619 Personal history of other infectious and parasitic diseases: Secondary | ICD-10-CM | POA: Insufficient documentation

## 2014-06-18 DIAGNOSIS — K760 Fatty (change of) liver, not elsewhere classified: Secondary | ICD-10-CM | POA: Diagnosis not present

## 2014-06-18 DIAGNOSIS — Z8262 Family history of osteoporosis: Secondary | ICD-10-CM | POA: Insufficient documentation

## 2014-06-18 DIAGNOSIS — G8929 Other chronic pain: Secondary | ICD-10-CM | POA: Diagnosis not present

## 2014-06-18 DIAGNOSIS — J449 Chronic obstructive pulmonary disease, unspecified: Secondary | ICD-10-CM | POA: Insufficient documentation

## 2014-06-18 DIAGNOSIS — Z8489 Family history of other specified conditions: Secondary | ICD-10-CM | POA: Diagnosis not present

## 2014-06-18 HISTORY — PX: TRIGGER FINGER RELEASE: SHX641

## 2014-06-18 LAB — GLUCOSE, CAPILLARY: Glucose-Capillary: 131 mg/dL — ABNORMAL HIGH (ref 65–99)

## 2014-06-18 SURGERY — RELEASE, A1 PULLEY, FOR TRIGGER FINGER
Anesthesia: General | Laterality: Right | Wound class: Clean

## 2014-06-18 MED ORDER — BUPIVACAINE HCL (PF) 0.25 % IJ SOLN
INTRAMUSCULAR | Status: AC
  Filled 2014-06-18: qty 30

## 2014-06-18 MED ORDER — MIDAZOLAM HCL 2 MG/2ML IJ SOLN
INTRAMUSCULAR | Status: DC | PRN
  Administered 2014-06-18: 2 mg via INTRAVENOUS

## 2014-06-18 MED ORDER — BUPIVACAINE HCL 0.25 % IJ SOLN
INTRAMUSCULAR | Status: DC | PRN
  Administered 2014-06-18: 6 mL

## 2014-06-18 MED ORDER — ONDANSETRON HCL 4 MG/2ML IJ SOLN
INTRAMUSCULAR | Status: DC | PRN
  Administered 2014-06-18: 4 mg via INTRAVENOUS

## 2014-06-18 MED ORDER — LIDOCAINE HCL (CARDIAC) 20 MG/ML IV SOLN
INTRAVENOUS | Status: DC | PRN
  Administered 2014-06-18: 60 mg via INTRAVENOUS

## 2014-06-18 MED ORDER — GLYCOPYRROLATE 0.2 MG/ML IJ SOLN
INTRAMUSCULAR | Status: DC | PRN
Start: 2014-06-18 — End: 2014-06-18
  Administered 2014-06-18: 0.2 mg via INTRAVENOUS

## 2014-06-18 MED ORDER — PROPOFOL 10 MG/ML IV BOLUS
INTRAVENOUS | Status: DC | PRN
Start: 2014-06-18 — End: 2014-06-18
  Administered 2014-06-18: 200 mg via INTRAVENOUS

## 2014-06-18 MED ORDER — ONDANSETRON HCL 4 MG/2ML IJ SOLN: 4.0000 mg | Freq: Once | INTRAMUSCULAR | Status: DC | PRN

## 2014-06-18 MED ORDER — FENTANYL CITRATE (PF) 100 MCG/2ML IJ SOLN
INTRAMUSCULAR | Status: DC | PRN
  Administered 2014-06-18 (×2): 50 ug via INTRAVENOUS

## 2014-06-18 MED ORDER — CEFAZOLIN SODIUM-DEXTROSE 2-3 GM-% IV SOLR
INTRAVENOUS | Status: AC
  Administered 2014-06-18: 2 g via INTRAVENOUS
  Filled 2014-06-18: qty 50

## 2014-06-18 MED ORDER — FENTANYL CITRATE (PF) 100 MCG/2ML IJ SOLN: 25.0000 ug | INTRAMUSCULAR | Status: DC | PRN

## 2014-06-18 MED ORDER — SODIUM CHLORIDE 0.9 % IV SOLN
INTRAVENOUS | Status: DC | PRN
  Administered 2014-06-18 (×2): via INTRAVENOUS

## 2014-06-18 SURGICAL SUPPLY — 18 items
BLADE SURG 15 STRL LF DISP TIS (BLADE) ×2 IMPLANT
BLADE SURG 15 STRL SS (BLADE) ×2
BNDG ESMARK 4X12 TAN STRL LF (GAUZE/BANDAGES/DRESSINGS) ×2 IMPLANT
CAST PADDING 3X4FT ST 30246 (SOFTGOODS) ×1
CHLORAPREP W/TINT 26ML (MISCELLANEOUS) ×2 IMPLANT
DECANTER SPIKE VIAL GLASS SM (MISCELLANEOUS) ×2 IMPLANT
DRSG DERMACEA 8X12 NADH (GAUZE/BANDAGES/DRESSINGS) ×2 IMPLANT
GAUZE SPONGE 4X4 12PLY STRL (GAUZE/BANDAGES/DRESSINGS) ×2 IMPLANT
GLOVE BIOGEL M STRL SZ7.5 (GLOVE) ×2 IMPLANT
GLOVE INDICATOR 7.5 STRL GRN (GLOVE) ×2 IMPLANT
GOWN STRL REUS W/ TWL LRG LVL3 (GOWN DISPOSABLE) ×1 IMPLANT
GOWN STRL REUS W/TWL LRG LVL3 (GOWN DISPOSABLE) ×3 IMPLANT
NS IRRIG 500ML POUR BTL (IV SOLUTION) ×2 IMPLANT
PACK EXTREMITY ARMC (MISCELLANEOUS) ×2 IMPLANT
PAD CAST CTTN 3X4 STRL (SOFTGOODS) ×1 IMPLANT
PAD GROUND ADULT SPLIT (MISCELLANEOUS) ×2 IMPLANT
STOCKINETTE STRL 4IN 9604848 (GAUZE/BANDAGES/DRESSINGS) ×2 IMPLANT
SUT ETHILON 5-0 FS-2 18 BLK (SUTURE) ×2 IMPLANT

## 2014-06-18 NOTE — Transfer of Care (Signed)
Immediate Anesthesia Transfer of Care Note  Patient: Erik Martinez  Procedure(s) Performed: Procedure(s): RELEASE TRIGGER FINGER/A-1 PULLEY (Right)  Patient Location: PACU  Anesthesia Type:General  Level of Consciousness: sedated  Airway & Oxygen Therapy: Patient Spontanous Breathing and Patient connected to face mask oxygen  Post-op Assessment: Report given to RN  Post vital signs: Reviewed and stable  Last Vitals:  Filed Vitals:   06/18/14 1710  BP: 133/88  Pulse: 83  Temp: 37.2 C  Resp: 12    Complications: No apparent anesthesia complications

## 2014-06-18 NOTE — Op Note (Signed)
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 M.D.  ANESTHESIA: general  ESTIMATED BLOOD LOSS: Minimal  FLUIDS REPLACED: 1000 mL of crystalloid  TOURNIQUET TIME: 25 minutes  DRAINS: None  INDICATIONS FOR SURGERY: [name] is a 59 y.o. year old male with a history of triggering and locking of the right index and ring fingers. The patient had not seen any significant improvement despite conservative nonsurgical intervention. After discussion of the risks and benefits of surgical intervention, the patient expressed understanding of the risks benefits and agree with plans for trigger finger release.   PROCEDURE IN DETAIL: The patient was brought into the operating room and after adequate general anesthesia, a tourniquet was placed on the patient's right upper arm.The right hand and arm were prepped with alcohol and Duraprep and draped in the usual sterile fashion. A "time-out" was performed as per usual protocol. The hand and forearm were exsanguinated using an Esmarch and the tourniquet was inflated to 250 mmHg. Loupe magnification was used throughout the procedure.  A transverse incision was made at the proximal extent of the A1 pulley of the ring finger. Blunt dissection was carried down to the tendon sheath and the edge of the A1 pulley was visualized. The A1 pulley was sharply incised using tenotomy scissors. Complete release of the A1 pulley was achieved. Smooth excursion of the flexor tendon was visualized. The wound was irrigated with copious amounts of fluid. The skin edges were reapproximated using #5-0 nylon. In a similar fashion, a transverse incision was made at the proximal extent of the A1 pulley of the index finger. Blunt  dissection was carried down to the tendon sheath and the leading edge of the A1 pulley was visualized. The A1 pulley was sharply incised using tenotomy scissors achieving complete release of the pulley. Smooth excursion of the flexor tendon was again visualized. The wound was irrigated with copious amounts of fluid. The skin edges were reapproximated using #5-0 nylon. 0.25% Marcaine was injected along the incision sites. The tourniquet was deflated after total tourniquet time of 25 minutes.  A sterile dressing was applied.  The patient tolerated the procedure well and was transported to the recovery room in stable condition.  [name] M.D.

## 2014-06-18 NOTE — Brief Op Note (Signed)
06/18/2014  5:07 PM  PATIENT:  Erik Martinez  59 y.o. male  PRE-OPERATIVE DIAGNOSIS:  TRIGGER FINGER right index and ring finger  POST-OPERATIVE DIAGNOSIS:  TRIGGER FINGER right index and ring finger  PROCEDURE:  Procedure(s): RELEASE TRIGGER FINGER/A-1 PULLEY (Right) index & ring fingers  SURGEON:  Surgeon(s) and Role:    * [name], MD - Primary  ASSISTANTS: none   ANESTHESIA:   general  EBL:  Total I/O In: 1000 [I.V.:1000] Out: - minimal  BLOOD ADMINISTERED:none  DRAINS: none   LOCAL MEDICATIONS USED:  MARCAINE     SPECIMEN:  No Specimen  DISPOSITION OF SPECIMEN:  N/A  COUNTS:  YES  TOURNIQUET:  25 min  DICTATION: .Dragon Dictation  PLAN OF CARE: Discharge to home after PACU  PATIENT DISPOSITION:  PACU - hemodynamically stable.   Delay start of Pharmacological VTE agent (>24hrs) due to surgical blood loss or risk of bleeding: not applicable
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
## 2014-06-18 NOTE — H&P (Signed)
The patient has been re-examined, and the chart reviewed, and there have been no interval changes to the documented history and physical.    The risks, benefits, and alternatives have been discussed at length, and the patient is willing to proceed.   

## 2014-06-18 NOTE — Anesthesia Procedure Notes (Signed)
Procedure Name: LMA Insertion Date/Time: 06/18/2014 4:11 PM Performed by: Dionne Bucy Pre-anesthesia Checklist: Patient identified Patient Re-evaluated:Patient Re-evaluated prior to inductionOxygen Delivery Method: Circle system utilized Preoxygenation: Pre-oxygenation with 100% oxygen Intubation Type: IV induction Ventilation: Mask ventilation without difficulty LMA: LMA inserted LMA Size: 5.0 Number of attempts: 1 Placement Confirmation: positive ETCO2 and breath sounds checked- equal and bilateral Tube secured with: Tape Dental Injury: Teeth and Oropharynx as per pre-operative assessment

## 2014-06-19 ENCOUNTER — Encounter: Payer: Self-pay | Admitting: Orthopedic Surgery

## 2014-06-21 NOTE — Anesthesia Postprocedure Evaluation (Signed)
  Anesthesia Post-op Note  Patient: Erik Martinez  Procedure(s) Performed: Procedure(s): RELEASE TRIGGER FINGER/A-1 PULLEY (Right)  Anesthesia type:General  Patient location: PACU  Post pain: Pain level controlled  Post assessment: Post-op Vital signs reviewed, Patient's Cardiovascular Status Stable, Respiratory Function Stable, Patent Airway and No signs of Nausea or vomiting  Post vital signs: Reviewed and stable  Last Vitals:  Filed Vitals:   06/18/14 1829  BP: 115/63  Pulse: 74  Temp:   Resp: 16    Level of consciousness: awake, alert  and patient cooperative  Complications: No apparent anesthesia complications

## 2014-09-29 DIAGNOSIS — E119 Type 2 diabetes mellitus without complications: Secondary | ICD-10-CM | POA: Insufficient documentation

## 2015-01-13 DIAGNOSIS — Z79891 Long term (current) use of opiate analgesic: Secondary | ICD-10-CM | POA: Diagnosis not present

## 2015-01-13 DIAGNOSIS — M1991 Primary osteoarthritis, unspecified site: Secondary | ICD-10-CM | POA: Diagnosis not present

## 2015-01-13 DIAGNOSIS — M4806 Spinal stenosis, lumbar region: Secondary | ICD-10-CM | POA: Diagnosis not present

## 2015-01-13 DIAGNOSIS — M170 Bilateral primary osteoarthritis of knee: Secondary | ICD-10-CM | POA: Diagnosis not present

## 2015-01-13 DIAGNOSIS — F419 Anxiety disorder, unspecified: Secondary | ICD-10-CM | POA: Diagnosis not present

## 2015-01-13 DIAGNOSIS — F4001 Agoraphobia with panic disorder: Secondary | ICD-10-CM | POA: Diagnosis not present

## 2015-01-13 DIAGNOSIS — M4802 Spinal stenosis, cervical region: Secondary | ICD-10-CM | POA: Diagnosis not present

## 2015-01-13 DIAGNOSIS — M25552 Pain in left hip: Secondary | ICD-10-CM | POA: Diagnosis not present

## 2015-01-13 DIAGNOSIS — M5136 Other intervertebral disc degeneration, lumbar region: Secondary | ICD-10-CM | POA: Diagnosis not present

## 2015-01-13 DIAGNOSIS — G894 Chronic pain syndrome: Secondary | ICD-10-CM | POA: Diagnosis not present

## 2015-01-13 DIAGNOSIS — G8929 Other chronic pain: Secondary | ICD-10-CM | POA: Diagnosis not present

## 2015-01-13 DIAGNOSIS — M545 Low back pain: Secondary | ICD-10-CM | POA: Diagnosis not present

## 2015-01-13 DIAGNOSIS — G9050 Complex regional pain syndrome I, unspecified: Secondary | ICD-10-CM | POA: Diagnosis not present

## 2015-02-16 DIAGNOSIS — M47817 Spondylosis without myelopathy or radiculopathy, lumbosacral region: Secondary | ICD-10-CM | POA: Diagnosis not present

## 2015-02-16 DIAGNOSIS — T84033D Mechanical loosening of internal left knee prosthetic joint, subsequent encounter: Secondary | ICD-10-CM | POA: Diagnosis not present

## 2015-02-16 DIAGNOSIS — M461 Sacroiliitis, not elsewhere classified: Secondary | ICD-10-CM | POA: Diagnosis not present

## 2015-02-25 DIAGNOSIS — M533 Sacrococcygeal disorders, not elsewhere classified: Secondary | ICD-10-CM | POA: Diagnosis not present

## 2015-03-09 DIAGNOSIS — T84033D Mechanical loosening of internal left knee prosthetic joint, subsequent encounter: Secondary | ICD-10-CM | POA: Diagnosis not present

## 2015-03-16 DIAGNOSIS — M4806 Spinal stenosis, lumbar region: Secondary | ICD-10-CM | POA: Diagnosis not present

## 2015-03-16 DIAGNOSIS — Z79891 Long term (current) use of opiate analgesic: Secondary | ICD-10-CM | POA: Diagnosis not present

## 2015-03-16 DIAGNOSIS — G8929 Other chronic pain: Secondary | ICD-10-CM | POA: Diagnosis not present

## 2015-03-16 DIAGNOSIS — M5136 Other intervertebral disc degeneration, lumbar region: Secondary | ICD-10-CM | POA: Diagnosis not present

## 2015-03-16 DIAGNOSIS — G894 Chronic pain syndrome: Secondary | ICD-10-CM | POA: Diagnosis not present

## 2015-03-16 DIAGNOSIS — M4802 Spinal stenosis, cervical region: Secondary | ICD-10-CM | POA: Diagnosis not present

## 2015-03-16 DIAGNOSIS — F419 Anxiety disorder, unspecified: Secondary | ICD-10-CM | POA: Diagnosis not present

## 2015-03-16 DIAGNOSIS — G9050 Complex regional pain syndrome I, unspecified: Secondary | ICD-10-CM | POA: Diagnosis not present

## 2015-03-16 DIAGNOSIS — M170 Bilateral primary osteoarthritis of knee: Secondary | ICD-10-CM | POA: Diagnosis not present

## 2015-03-16 DIAGNOSIS — M1991 Primary osteoarthritis, unspecified site: Secondary | ICD-10-CM | POA: Diagnosis not present

## 2015-03-16 DIAGNOSIS — M25552 Pain in left hip: Secondary | ICD-10-CM | POA: Diagnosis not present

## 2015-03-16 DIAGNOSIS — M545 Low back pain: Secondary | ICD-10-CM | POA: Diagnosis not present

## 2015-03-16 DIAGNOSIS — F4001 Agoraphobia with panic disorder: Secondary | ICD-10-CM | POA: Diagnosis not present

## 2015-03-26 DIAGNOSIS — K219 Gastro-esophageal reflux disease without esophagitis: Secondary | ICD-10-CM | POA: Diagnosis not present

## 2015-03-26 DIAGNOSIS — N529 Male erectile dysfunction, unspecified: Secondary | ICD-10-CM | POA: Diagnosis not present

## 2015-03-26 DIAGNOSIS — F411 Generalized anxiety disorder: Secondary | ICD-10-CM | POA: Diagnosis not present

## 2015-03-26 DIAGNOSIS — E119 Type 2 diabetes mellitus without complications: Secondary | ICD-10-CM | POA: Diagnosis not present

## 2015-03-26 DIAGNOSIS — F331 Major depressive disorder, recurrent, moderate: Secondary | ICD-10-CM | POA: Diagnosis not present

## 2015-04-15 DIAGNOSIS — E119 Type 2 diabetes mellitus without complications: Secondary | ICD-10-CM | POA: Diagnosis not present

## 2015-04-15 DIAGNOSIS — G894 Chronic pain syndrome: Secondary | ICD-10-CM | POA: Diagnosis not present

## 2015-04-15 DIAGNOSIS — F411 Generalized anxiety disorder: Secondary | ICD-10-CM | POA: Diagnosis not present

## 2015-04-15 DIAGNOSIS — E291 Testicular hypofunction: Secondary | ICD-10-CM | POA: Diagnosis not present

## 2015-05-08 DIAGNOSIS — E119 Type 2 diabetes mellitus without complications: Secondary | ICD-10-CM | POA: Diagnosis not present

## 2015-05-12 DIAGNOSIS — F4001 Agoraphobia with panic disorder: Secondary | ICD-10-CM | POA: Diagnosis not present

## 2015-05-12 DIAGNOSIS — M1991 Primary osteoarthritis, unspecified site: Secondary | ICD-10-CM | POA: Diagnosis not present

## 2015-05-12 DIAGNOSIS — G9050 Complex regional pain syndrome I, unspecified: Secondary | ICD-10-CM | POA: Diagnosis not present

## 2015-05-12 DIAGNOSIS — M545 Low back pain: Secondary | ICD-10-CM | POA: Diagnosis not present

## 2015-05-12 DIAGNOSIS — M4806 Spinal stenosis, lumbar region: Secondary | ICD-10-CM | POA: Diagnosis not present

## 2015-05-12 DIAGNOSIS — M4802 Spinal stenosis, cervical region: Secondary | ICD-10-CM | POA: Diagnosis not present

## 2015-05-12 DIAGNOSIS — G8929 Other chronic pain: Secondary | ICD-10-CM | POA: Diagnosis not present

## 2015-05-12 DIAGNOSIS — M25552 Pain in left hip: Secondary | ICD-10-CM | POA: Diagnosis not present

## 2015-05-12 DIAGNOSIS — G894 Chronic pain syndrome: Secondary | ICD-10-CM | POA: Diagnosis not present

## 2015-05-12 DIAGNOSIS — F419 Anxiety disorder, unspecified: Secondary | ICD-10-CM | POA: Diagnosis not present

## 2015-05-12 DIAGNOSIS — M170 Bilateral primary osteoarthritis of knee: Secondary | ICD-10-CM | POA: Diagnosis not present

## 2015-05-12 DIAGNOSIS — M5136 Other intervertebral disc degeneration, lumbar region: Secondary | ICD-10-CM | POA: Diagnosis not present

## 2015-05-15 DIAGNOSIS — R200 Anesthesia of skin: Secondary | ICD-10-CM | POA: Insufficient documentation

## 2015-05-15 DIAGNOSIS — Z125 Encounter for screening for malignant neoplasm of prostate: Secondary | ICD-10-CM | POA: Diagnosis not present

## 2015-05-15 DIAGNOSIS — F411 Generalized anxiety disorder: Secondary | ICD-10-CM | POA: Diagnosis not present

## 2015-05-15 DIAGNOSIS — E349 Endocrine disorder, unspecified: Secondary | ICD-10-CM | POA: Insufficient documentation

## 2015-05-15 DIAGNOSIS — G894 Chronic pain syndrome: Secondary | ICD-10-CM | POA: Diagnosis not present

## 2015-05-15 DIAGNOSIS — E291 Testicular hypofunction: Secondary | ICD-10-CM | POA: Diagnosis not present

## 2015-06-02 DIAGNOSIS — Z01812 Encounter for preprocedural laboratory examination: Secondary | ICD-10-CM | POA: Diagnosis not present

## 2015-06-02 DIAGNOSIS — E1039 Type 1 diabetes mellitus with other diabetic ophthalmic complication: Secondary | ICD-10-CM | POA: Diagnosis not present

## 2015-07-10 DIAGNOSIS — E119 Type 2 diabetes mellitus without complications: Secondary | ICD-10-CM | POA: Diagnosis not present

## 2015-07-10 DIAGNOSIS — M79609 Pain in unspecified limb: Secondary | ICD-10-CM | POA: Diagnosis not present

## 2015-07-17 DIAGNOSIS — L299 Pruritus, unspecified: Secondary | ICD-10-CM | POA: Diagnosis not present

## 2015-07-17 DIAGNOSIS — L259 Unspecified contact dermatitis, unspecified cause: Secondary | ICD-10-CM | POA: Diagnosis not present

## 2015-07-20 DIAGNOSIS — M531 Cervicobrachial syndrome: Secondary | ICD-10-CM | POA: Diagnosis not present

## 2015-07-20 DIAGNOSIS — G9050 Complex regional pain syndrome I, unspecified: Secondary | ICD-10-CM | POA: Diagnosis not present

## 2015-07-20 DIAGNOSIS — M170 Bilateral primary osteoarthritis of knee: Secondary | ICD-10-CM | POA: Diagnosis not present

## 2015-07-20 DIAGNOSIS — M5136 Other intervertebral disc degeneration, lumbar region: Secondary | ICD-10-CM | POA: Diagnosis not present

## 2015-07-20 DIAGNOSIS — F4001 Agoraphobia with panic disorder: Secondary | ICD-10-CM | POA: Diagnosis not present

## 2015-07-20 DIAGNOSIS — M4806 Spinal stenosis, lumbar region: Secondary | ICD-10-CM | POA: Diagnosis not present

## 2015-07-20 DIAGNOSIS — M25552 Pain in left hip: Secondary | ICD-10-CM | POA: Diagnosis not present

## 2015-07-20 DIAGNOSIS — G8929 Other chronic pain: Secondary | ICD-10-CM | POA: Diagnosis not present

## 2015-07-20 DIAGNOSIS — M1991 Primary osteoarthritis, unspecified site: Secondary | ICD-10-CM | POA: Diagnosis not present

## 2015-07-20 DIAGNOSIS — G894 Chronic pain syndrome: Secondary | ICD-10-CM | POA: Diagnosis not present

## 2015-07-20 DIAGNOSIS — F419 Anxiety disorder, unspecified: Secondary | ICD-10-CM | POA: Diagnosis not present

## 2015-07-20 DIAGNOSIS — M545 Low back pain: Secondary | ICD-10-CM | POA: Diagnosis not present

## 2015-07-22 DIAGNOSIS — E119 Type 2 diabetes mellitus without complications: Secondary | ICD-10-CM | POA: Diagnosis not present

## 2015-07-22 DIAGNOSIS — M549 Dorsalgia, unspecified: Secondary | ICD-10-CM | POA: Diagnosis not present

## 2015-07-22 DIAGNOSIS — M79609 Pain in unspecified limb: Secondary | ICD-10-CM | POA: Diagnosis not present

## 2015-07-27 DIAGNOSIS — G894 Chronic pain syndrome: Secondary | ICD-10-CM | POA: Diagnosis not present

## 2015-07-27 DIAGNOSIS — R200 Anesthesia of skin: Secondary | ICD-10-CM | POA: Diagnosis not present

## 2015-07-27 DIAGNOSIS — G479 Sleep disorder, unspecified: Secondary | ICD-10-CM | POA: Diagnosis not present

## 2015-07-27 DIAGNOSIS — F411 Generalized anxiety disorder: Secondary | ICD-10-CM | POA: Diagnosis not present

## 2015-09-08 ENCOUNTER — Emergency Department: Payer: PPO

## 2015-09-08 ENCOUNTER — Emergency Department
Admission: EM | Admit: 2015-09-08 | Discharge: 2015-09-09 | Disposition: A | Discharge status: Other Acute Inpatient Hospital | Payer: PPO | Attending: Emergency Medicine | Admitting: Emergency Medicine

## 2015-09-08 DIAGNOSIS — S81802A Unspecified open wound, left lower leg, initial encounter: Secondary | ICD-10-CM | POA: Diagnosis not present

## 2015-09-08 DIAGNOSIS — Z79899 Other long term (current) drug therapy: Secondary | ICD-10-CM | POA: Insufficient documentation

## 2015-09-08 DIAGNOSIS — W3400XA Accidental discharge from unspecified firearms or gun, initial encounter: Secondary | ICD-10-CM | POA: Diagnosis not present

## 2015-09-08 DIAGNOSIS — Z7984 Long term (current) use of oral hypoglycemic drugs: Secondary | ICD-10-CM | POA: Diagnosis not present

## 2015-09-08 DIAGNOSIS — E119 Type 2 diabetes mellitus without complications: Secondary | ICD-10-CM | POA: Insufficient documentation

## 2015-09-08 DIAGNOSIS — Y9389 Activity, other specified: Secondary | ICD-10-CM | POA: Insufficient documentation

## 2015-09-08 DIAGNOSIS — R45851 Suicidal ideations: Secondary | ICD-10-CM

## 2015-09-08 DIAGNOSIS — R030 Elevated blood-pressure reading, without diagnosis of hypertension: Secondary | ICD-10-CM | POA: Diagnosis not present

## 2015-09-08 DIAGNOSIS — Y92009 Unspecified place in unspecified non-institutional (private) residence as the place of occurrence of the external cause: Secondary | ICD-10-CM | POA: Diagnosis not present

## 2015-09-08 DIAGNOSIS — F1490 Cocaine use, unspecified, uncomplicated: Secondary | ICD-10-CM | POA: Insufficient documentation

## 2015-09-08 DIAGNOSIS — L03116 Cellulitis of left lower limb: Secondary | ICD-10-CM | POA: Diagnosis not present

## 2015-09-08 DIAGNOSIS — Y999 Unspecified external cause status: Secondary | ICD-10-CM | POA: Diagnosis not present

## 2015-09-08 DIAGNOSIS — F319 Bipolar disorder, unspecified: Secondary | ICD-10-CM | POA: Diagnosis not present

## 2015-09-08 DIAGNOSIS — Y249XXA Unspecified firearm discharge, undetermined intent, initial encounter: Secondary | ICD-10-CM

## 2015-09-08 DIAGNOSIS — L03119 Cellulitis of unspecified part of limb: Secondary | ICD-10-CM | POA: Diagnosis not present

## 2015-09-08 DIAGNOSIS — F17209 Nicotine dependence, unspecified, with unspecified nicotine-induced disorders: Secondary | ICD-10-CM

## 2015-09-08 DIAGNOSIS — F11959 Opioid use, unspecified with opioid-induced psychotic disorder, unspecified: Secondary | ICD-10-CM

## 2015-09-08 DIAGNOSIS — L0390 Cellulitis, unspecified: Secondary | ICD-10-CM

## 2015-09-08 HISTORY — DX: Endocrine disorder, unspecified: E34.9

## 2015-09-08 HISTORY — DX: Opioid use, unspecified with opioid-induced psychotic disorder, unspecified: F11.959

## 2015-09-08 LAB — CBC WITH DIFFERENTIAL/PLATELET
Basophils Absolute: 0 10*3/uL (ref 0–0.1)
Basophils Relative: 0 %
Eosinophils Absolute: 0.3 10*3/uL (ref 0–0.7)
Eosinophils Relative: 4 %
HCT: 40.8 % (ref 40.0–52.0)
Hemoglobin: 14.4 g/dL (ref 13.0–18.0)
Lymphocytes Relative: 18 %
Lymphs Abs: 1.5 10*3/uL (ref 1.0–3.6)
MCH: 29 pg (ref 26.0–34.0)
MCHC: 35.2 g/dL (ref 32.0–36.0)
MCV: 82.4 fL (ref 80.0–100.0)
Monocytes Absolute: 0.6 10*3/uL (ref 0.2–1.0)
Monocytes Relative: 8 %
Neutro Abs: 6 10*3/uL (ref 1.4–6.5)
Neutrophils Relative %: 70 %
Platelets: 204 10*3/uL (ref 150–440)
RBC: 4.96 MIL/uL (ref 4.40–5.90)
RDW: 14 % (ref 11.5–14.5)
WBC: 8.4 10*3/uL (ref 3.8–10.6)

## 2015-09-08 LAB — ETHANOL: Alcohol, Ethyl (B): <5 mg/dL (ref <5)

## 2015-09-08 LAB — URINALYSIS COMPLETE WITH MICROSCOPIC (ARMC ONLY)
Bacteria, UA: NONE SEEN
Bilirubin Urine: NEGATIVE
Glucose, UA: >500 mg/dL — AB
Hgb urine dipstick: NEGATIVE
Ketones, ur: NEGATIVE mg/dL
Leukocytes, UA: NEGATIVE
Nitrite: NEGATIVE
Protein, ur: NEGATIVE mg/dL
RBC / HPF: NONE SEEN RBC/hpf (ref 0–5)
Specific Gravity, Urine: 1 — ABNORMAL LOW (ref 1.005–1.030)
Squamous Epithelial / LPF: NONE SEEN
WBC, UA: NONE SEEN WBC/hpf (ref 0–5)
pH: 8 (ref 5.0–8.0)

## 2015-09-08 LAB — COMPREHENSIVE METABOLIC PANEL
ALT: 14 U/L — ABNORMAL LOW (ref 17–63)
AST: 19 U/L (ref 15–41)
Albumin: 3.8 g/dL (ref 3.5–5.0)
Alkaline Phosphatase: 71 U/L (ref 38–126)
Anion gap: 11 (ref 5–15)
BUN: 12 mg/dL (ref 6–20)
CO2: 26 mmol/L (ref 22–32)
Calcium: 9 mg/dL (ref 8.9–10.3)
Chloride: 100 mmol/L — ABNORMAL LOW (ref 101–111)
Creatinine, Ser: 0.77 mg/dL (ref 0.61–1.24)
GFR calc Af Amer: >60 mL/min (ref >60)
GFR calc non Af Amer: >60 mL/min (ref >60)
Glucose, Bld: 139 mg/dL — ABNORMAL HIGH (ref 65–99)
Potassium: 3.9 mmol/L (ref 3.5–5.1)
Sodium: 137 mmol/L (ref 135–145)
Total Bilirubin: 0.6 mg/dL (ref 0.3–1.2)
Total Protein: 7.1 g/dL (ref 6.5–8.1)

## 2015-09-08 LAB — GLUCOSE, CAPILLARY: Glucose-Capillary: 232 mg/dL — ABNORMAL HIGH (ref 65–99)

## 2015-09-08 LAB — ACETAMINOPHEN LEVEL: Acetaminophen (Tylenol), Serum: <10 ug/mL — ABNORMAL LOW (ref 10–30)

## 2015-09-08 LAB — SALICYLATE LEVEL: Salicylate Lvl: <4 mg/dL (ref 2.8–30.0)

## 2015-09-08 MED ORDER — METFORMIN HCL 500 MG PO TABS: 500.0000 mg | ORAL_TABLET | Freq: Two times a day (BID) | ORAL | Status: DC

## 2015-09-08 MED ORDER — OXYCODONE HCL ER 10 MG PO T12A
80.0000 mg | EXTENDED_RELEASE_TABLET | Freq: Two times a day (BID) | ORAL | Status: DC
  Administered 2015-09-08: 80 mg via ORAL
  Filled 2015-09-08: qty 8

## 2015-09-08 MED ORDER — VANCOMYCIN HCL IN DEXTROSE 1-5 GM/200ML-% IV SOLN
1000.0000 mg | Freq: Once | INTRAVENOUS | Status: AC
  Administered 2015-09-08: 1000 mg via INTRAVENOUS
  Filled 2015-09-08 (×2): qty 200

## 2015-09-08 MED ORDER — PANTOPRAZOLE SODIUM 40 MG PO TBEC
DELAYED_RELEASE_TABLET | ORAL | Status: AC
  Administered 2015-09-08: 40 mg via ORAL
  Filled 2015-09-08: qty 1

## 2015-09-08 MED ORDER — AMOXICILLIN-POT CLAVULANATE 875-125 MG PO TABS
1.0000 | ORAL_TABLET | Freq: Two times a day (BID) | ORAL | Status: DC
  Administered 2015-09-08: 1 via ORAL
  Filled 2015-09-08: qty 1

## 2015-09-08 MED ORDER — ALPRAZOLAM 0.5 MG PO TABS
0.5000 mg | ORAL_TABLET | Freq: Three times a day (TID) | ORAL | Status: DC
  Administered 2015-09-08: 0.5 mg via ORAL
  Filled 2015-09-08: qty 1

## 2015-09-08 MED ORDER — INSULIN ASPART 100 UNIT/ML ~~LOC~~ SOLN
2.0000 [IU] | Freq: Once | SUBCUTANEOUS | Status: AC
  Administered 2015-09-08: 2 [IU] via INTRAVENOUS
  Filled 2015-09-08: qty 2

## 2015-09-08 MED ORDER — PANTOPRAZOLE SODIUM 40 MG PO TBEC
40.0000 mg | DELAYED_RELEASE_TABLET | Freq: Every day | ORAL | Status: DC
  Administered 2015-09-08: 40 mg via ORAL

## 2015-09-08 MED ORDER — GLIPIZIDE 10 MG PO TABS
10.0000 mg | ORAL_TABLET | Freq: Every day | ORAL | Status: DC
  Filled 2015-09-08: qty 1

## 2015-09-08 MED ORDER — SODIUM CHLORIDE 0.9 % IV SOLN
3.0000 g | Freq: Once | INTRAVENOUS | Status: AC
  Administered 2015-09-08: 3 g via INTRAVENOUS
  Filled 2015-09-08: qty 3

## 2015-09-08 NOTE — ED Notes (Signed)
The patient was dressed out into the required purple scrubs. His cloths were placed into the white patient belongs bag and labeled with proper tag. No incidents during the dressing out process. Belongings bag was then given to the RN for storage.
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
## 2015-09-08 NOTE — ED Notes (Signed)
IVC'd prior to arrival/Consult pending
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
## 2015-09-08 NOTE — BH Assessment (Signed)
Patient is to be admitted to [place] by Dr. Weber Cooks.  Attending Physician will be Dr. Bary Leriche.   Patient has been assigned to room 303-B, by [place].   Intake Paper Work has been signed and placed on patient chart.  ER staff is aware of the admission (Dr. Cinda Quest, ER MD; Kerry Fort, [place], Patient Access).
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
## 2015-09-08 NOTE — ED Notes (Signed)
BEHAVIORAL HEALTH ROUNDING Patient sleeping: YES Patient alert and oriented: SLEEPING Behavior appropriate: SLEEPING Nutrition and fluids offered: SLEEPING Toileting and hygiene offered: SLEEPING Sitter present: YES Law enforcement present: YES 
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
## 2015-09-09 ENCOUNTER — Inpatient Hospital Stay
Admit: 2015-09-09 | Discharge: 2015-09-10 | DRG: 885 | Disposition: A | Discharge status: Home / Self Care | Payer: PPO | Attending: Psychiatry | Admitting: Psychiatry

## 2015-09-09 DIAGNOSIS — Z885 Allergy status to narcotic agent status: Secondary | ICD-10-CM

## 2015-09-09 DIAGNOSIS — R45851 Suicidal ideations: Secondary | ICD-10-CM | POA: Diagnosis present

## 2015-09-09 DIAGNOSIS — G894 Chronic pain syndrome: Secondary | ICD-10-CM

## 2015-09-09 DIAGNOSIS — E1165 Type 2 diabetes mellitus with hyperglycemia: Secondary | ICD-10-CM

## 2015-09-09 DIAGNOSIS — Z8619 Personal history of other infectious and parasitic diseases: Secondary | ICD-10-CM

## 2015-09-09 DIAGNOSIS — Z96652 Presence of left artificial knee joint: Secondary | ICD-10-CM | POA: Diagnosis present

## 2015-09-09 DIAGNOSIS — F332 Major depressive disorder, recurrent severe without psychotic features: Principal | ICD-10-CM | POA: Diagnosis present

## 2015-09-09 DIAGNOSIS — Z96642 Presence of left artificial hip joint: Secondary | ICD-10-CM | POA: Diagnosis present

## 2015-09-09 DIAGNOSIS — Z833 Family history of diabetes mellitus: Secondary | ICD-10-CM

## 2015-09-09 DIAGNOSIS — G4700 Insomnia, unspecified: Secondary | ICD-10-CM | POA: Diagnosis present

## 2015-09-09 DIAGNOSIS — F11959 Opioid use, unspecified with opioid-induced psychotic disorder, unspecified: Secondary | ICD-10-CM | POA: Diagnosis present

## 2015-09-09 DIAGNOSIS — Y249XXA Unspecified firearm discharge, undetermined intent, initial encounter: Secondary | ICD-10-CM

## 2015-09-09 DIAGNOSIS — E119 Type 2 diabetes mellitus without complications: Secondary | ICD-10-CM

## 2015-09-09 DIAGNOSIS — F419 Anxiety disorder, unspecified: Secondary | ICD-10-CM | POA: Diagnosis present

## 2015-09-09 DIAGNOSIS — F1410 Cocaine abuse, uncomplicated: Secondary | ICD-10-CM | POA: Diagnosis present

## 2015-09-09 DIAGNOSIS — Z981 Arthrodesis status: Secondary | ICD-10-CM

## 2015-09-09 DIAGNOSIS — F1420 Cocaine dependence, uncomplicated: Secondary | ICD-10-CM | POA: Diagnosis present

## 2015-09-09 DIAGNOSIS — M1990 Unspecified osteoarthritis, unspecified site: Secondary | ICD-10-CM | POA: Diagnosis present

## 2015-09-09 DIAGNOSIS — E1142 Type 2 diabetes mellitus with diabetic polyneuropathy: Secondary | ICD-10-CM

## 2015-09-09 DIAGNOSIS — K219 Gastro-esophageal reflux disease without esophagitis: Secondary | ICD-10-CM | POA: Diagnosis present

## 2015-09-09 DIAGNOSIS — Z8659 Personal history of other mental and behavioral disorders: Secondary | ICD-10-CM | POA: Diagnosis present

## 2015-09-09 DIAGNOSIS — Z9889 Other specified postprocedural states: Secondary | ICD-10-CM

## 2015-09-09 DIAGNOSIS — M25562 Pain in left knee: Secondary | ICD-10-CM | POA: Diagnosis present

## 2015-09-09 DIAGNOSIS — Z886 Allergy status to analgesic agent status: Secondary | ICD-10-CM

## 2015-09-09 HISTORY — DX: Cocaine dependence, uncomplicated: F14.20

## 2015-09-09 LAB — URINE DRUG SCREEN, QUALITATIVE (ARMC ONLY)
Amphetamines, Ur Screen: NOT DETECTED
Barbiturates, Ur Screen: NOT DETECTED
Benzodiazepine, Ur Scrn: POSITIVE — AB
Cannabinoid 50 Ng, Ur ~~LOC~~: NOT DETECTED
Cocaine Metabolite,Ur ~~LOC~~: POSITIVE — AB
MDMA (Ecstasy)Ur Screen: NOT DETECTED
Methadone Scn, Ur: NOT DETECTED
Opiate, Ur Screen: NOT DETECTED
Phencyclidine (PCP) Ur S: NOT DETECTED
Tricyclic, Ur Screen: NOT DETECTED

## 2015-09-09 LAB — URINALYSIS COMPLETE WITH MICROSCOPIC (ARMC ONLY)
Bacteria, UA: NONE SEEN
Bilirubin Urine: NEGATIVE
Glucose, UA: NEGATIVE mg/dL
Hgb urine dipstick: NEGATIVE
Ketones, ur: NEGATIVE mg/dL
Leukocytes, UA: NEGATIVE
Nitrite: NEGATIVE
Protein, ur: NEGATIVE mg/dL
RBC / HPF: NONE SEEN RBC/hpf (ref 0–5)
Specific Gravity, Urine: 1.006 (ref 1.005–1.030)
Squamous Epithelial / LPF: NONE SEEN
pH: 9 — ABNORMAL HIGH (ref 5.0–8.0)

## 2015-09-09 LAB — LIPID PANEL
Cholesterol: 197 mg/dL (ref 0–200)
HDL: 61 mg/dL (ref >40)
LDL Cholesterol: 113 mg/dL — ABNORMAL HIGH (ref 0–99)
Total CHOL/HDL Ratio: 3.2 RATIO
Triglycerides: 117 mg/dL (ref <150)
VLDL: 23 mg/dL (ref 0–40)

## 2015-09-09 LAB — HEMOGLOBIN A1C: Hgb A1c MFr Bld: 6.9 % — ABNORMAL HIGH (ref 4.0–6.0)

## 2015-09-09 MED ORDER — GLIPIZIDE 10 MG PO TABS
10.0000 mg | ORAL_TABLET | Freq: Every day | ORAL | Status: DC
  Administered 2015-09-09 – 2015-09-10 (×2): 10 mg via ORAL
  Filled 2015-09-09 (×2): qty 1

## 2015-09-09 MED ORDER — ACETAMINOPHEN 325 MG PO TABS: 650.0000 mg | ORAL_TABLET | Freq: Four times a day (QID) | ORAL | Status: DC | PRN

## 2015-09-09 MED ORDER — AMOXICILLIN-POT CLAVULANATE 875-125 MG PO TABS
1.0000 | ORAL_TABLET | Freq: Two times a day (BID) | ORAL | Status: DC
  Administered 2015-09-09 – 2015-09-10 (×3): 1 via ORAL
  Filled 2015-09-09 (×3): qty 1

## 2015-09-09 MED ORDER — PANTOPRAZOLE SODIUM 40 MG PO TBEC
40.0000 mg | DELAYED_RELEASE_TABLET | Freq: Every day | ORAL | Status: DC
  Administered 2015-09-10: 40 mg via ORAL
  Filled 2015-09-09 (×2): qty 1

## 2015-09-09 MED ORDER — METFORMIN HCL 500 MG PO TABS
500.0000 mg | ORAL_TABLET | Freq: Two times a day (BID) | ORAL | Status: DC
  Administered 2015-09-09 – 2015-09-10 (×3): 500 mg via ORAL
  Filled 2015-09-09 (×3): qty 1

## 2015-09-09 MED ORDER — OXYCODONE HCL ER 10 MG PO T12A
80.0000 mg | EXTENDED_RELEASE_TABLET | Freq: Two times a day (BID) | ORAL | Status: DC
  Administered 2015-09-09 – 2015-09-10 (×3): 80 mg via ORAL
  Filled 2015-09-09 (×3): qty 8

## 2015-09-09 MED ORDER — ALPRAZOLAM 0.5 MG PO TABS
0.5000 mg | ORAL_TABLET | Freq: Three times a day (TID) | ORAL | Status: DC
  Administered 2015-09-09 – 2015-09-10 (×4): 0.5 mg via ORAL
  Filled 2015-09-09 (×4): qty 1

## 2015-09-09 MED ORDER — MAGNESIUM HYDROXIDE 400 MG/5ML PO SUSP
30.0000 mL | Freq: Every day | ORAL | Status: DC | PRN
Start: 2015-09-09 — End: 2015-09-10

## 2015-09-09 MED ORDER — AMOXICILLIN-POT CLAVULANATE 875-125 MG PO TABS: 1.0000 | ORAL_TABLET | Freq: Two times a day (BID) | ORAL | 0 refills | Status: DC

## 2015-09-09 MED ORDER — ALUM & MAG HYDROXIDE-SIMETH 200-200-20 MG/5ML PO SUSP
30.0000 mL | ORAL | Status: DC | PRN
  Administered 2015-09-09: 30 mL via ORAL
  Filled 2015-09-09: qty 30

## 2015-09-09 NOTE — ED Notes (Signed)
No answer when calling again to give report to [phone]

## 2015-09-09 NOTE — ED Notes (Signed)
No answer upon attempt to call report to [phone]

## 2015-09-09 NOTE — Progress Notes (Signed)
  [facility] Adult Case Management Discharge Plan :  Will you be returning to the same living situation after discharge:  Yes,  pt will be returning to his home in [place] At discharge, do you have transportation home?: Yes,  pt will be picked up by the pt's son Do you have the ability to pay for your medications: Yes,  pt will be provided with prescriptions upon discharge  Release of information consent forms completed and in the chart;  Patient's signature needed at discharge.  Patient to Follow up at: Follow-up Information    [facility] .   Why:  Please arrive for your appointment on Wednesday September 13th  2017 at 12:30pm with Dr. Einar Grad and on on Monday September 18th at 2pm with Royal Piedra   Please arrive for your hospital follow up and assessment for medication management and therapy Contact information: [address], [place] [address] Ph: [phone] Fax: [phone]          Next level of care provider has access to [place] and Suicide Prevention discussed: Yes,  completed with pt  Have you used any form of tobacco in the last 30 days? (Cigarettes, Smokeless Tobacco, Cigars, and/or Pipes): No  Has patient been referred to the Quitline?: N/A patient is not a smoker  Patient has been referred for addiction treatment: Pt. refused referral  Alphonse Guild Carlie Corpus 09/09/2015, 3:22 PM

## 2015-09-09 NOTE — ED Notes (Signed)
Reviewed plan of care with intake nurse Roxanna who reports pt was supposed to have been admitted yesterday evening and has bed ready; reviewed chart and previous intake nurse had notified care nurse; charge nurse notified and will call report now to behav unit

## 2015-09-09 NOTE — [facility] Suicide Risk Assessment (Signed)
[place] INPATIENT:  Family/Significant Other Suicide Prevention Education  Suicide Prevention Education:  Patient Refusal for Family/Significant Other Suicide Prevention Education: The patient Erik Martinez has refused to provide written consent for family/significant other to be provided Family/Significant Other Suicide Prevention Education during admission and/or prior to discharge.  Physician notified.  CSW completed SPE with the pt.    [name] 09/09/2015, 3:20 PM
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
## 2015-09-09 NOTE — ED Notes (Signed)
Intake nurse st to call [phone]
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
## 2015-09-09 NOTE — ED Notes (Signed)
BEHAVIORAL HEALTH ROUNDING Patient sleeping: YES Patient alert and oriented: SLEEPING Behavior appropriate: SLEEPING Nutrition and fluids offered: SLEEPING Toileting and hygiene offered: SLEEPING Sitter present: YES Law enforcement present: YES 

## 2015-09-09 NOTE — Plan of Care (Signed)
Problem: Coping: Goal: Ability to verbalize frustrations and anger appropriately will improve Outcome: Progressing Patient working on coping skills

## 2015-09-09 NOTE — BHH Group Notes (Signed)
[facility] Group Notes:  (Nursing/MHT/Case Management/Adjunct)  Date:  09/09/2015  Time:  10:12 PM  Type of Therapy:  Evening Wtap-up Group  Participation Level:  Did Not Attend  Participation Quality:  Did Not Attend  Affect:  N/A  Cognitive:  N/A  Insight:  None  Engagement in Group:  Did Not Attend  Modes of Intervention:  Activity and Discussion  Summary of Progress/Problems:  Levonne Spiller 09/09/2015, 10:12 PM

## 2015-09-09 NOTE — Tx Team (Signed)
Initial Treatment Plan 09/09/2015 4:35 AM [name] [id number]    PATIENT STRESSORS: Loss of dog Medication change or noncompliance Substance abuse   PATIENT STRENGTHS: Capable of independent living General fund of knowledge Supportive family/friends   PATIENT IDENTIFIED PROBLEMS: Depression  Suicidal Ideation  Medication noncompliance  Substance abuse    "going home"           DISCHARGE CRITERIA:  Improved stabilization in mood, thinking, and/or behavior Motivation to continue treatment in a less acute level of care Need for constant or close observation no longer present Reduction of life-threatening or endangering symptoms to within safe limits Verbal commitment to aftercare and medication compliance  PRELIMINARY DISCHARGE PLAN: Attend aftercare/continuing care group Outpatient therapy  PATIENT/FAMILY INVOLVEMENT: This treatment plan has been presented to and reviewed with the patient, Erik Martinez, and/or family member.  The patient and family have been given the opportunity to ask questions and make suggestions.  Kai Levins, RN 09/09/2015, 4:35 AM

## 2015-09-09 NOTE — ED Notes (Signed)
Pt awakens easily & voices understanding of plan of care; Pt taken to behav health unit via w/c by ED tech and ODS officer
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
## 2015-09-10 DIAGNOSIS — F332 Major depressive disorder, recurrent severe without psychotic features: Secondary | ICD-10-CM | POA: Diagnosis not present

## 2015-09-10 LAB — GLUCOSE, CAPILLARY
Glucose-Capillary: 151 mg/dL — ABNORMAL HIGH (ref 65–99)
Glucose-Capillary: 179 mg/dL — ABNORMAL HIGH (ref 65–99)
Glucose-Capillary: 138 mg/dL — ABNORMAL HIGH (ref 65–99)

## 2015-09-10 LAB — URINE CULTURE: Culture: NO GROWTH
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
## 2015-09-10 NOTE — Progress Notes (Signed)
Flat, irritable, rude, and sarcastic during the initial introduction while still in bed at the start of shift. Ambulating freely in [place]. Medications carefully reviewed and with another RN. Prescribed 80 mg of Oxycontin 12 hr tablet given with bedtime medication; Maalox 30 ml PO, PRN given for indigestion.
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
## 2015-09-10 NOTE — Plan of Care (Signed)
Problem: Safety: Goal: Ability to remain free from injury will improve Outcome: Progressing 15 minute checks maintained for safety, clinical and moral support provided, patient encouraged to continue to express feelings and demonstrate safe care. Patient remains free from harm, will continue to monitor.      

## 2015-09-10 NOTE — Progress Notes (Signed)
D:Patient aware of discharge this shift . Patient returning home . Patient received all belonging locked up . Patient denies  Suicidal  And homicidal ideations  .  A: Writer instructed on discharge criteria  . Informed Discharge Summary, Suicide Risk Assessment , Transitional Record  prescriptions  given to patient  . Aware  Of follow up appointment . R: Patient left unit with no questions  Or concerns  With son

## 2015-09-11 LAB — URINE CULTURE: Culture: NO GROWTH

## 2015-09-22 ENCOUNTER — Other Ambulatory Visit: Payer: Self-pay | Admitting: Psychiatry

## 2015-09-23 ENCOUNTER — Ambulatory Visit: Payer: Self-pay | Admitting: Psychiatry

## 2015-09-28 ENCOUNTER — Ambulatory Visit: Payer: Self-pay | Admitting: Licensed Clinical Social Worker

## 2015-10-03 ENCOUNTER — Encounter: Payer: Self-pay | Admitting: Emergency Medicine

## 2015-10-03 ENCOUNTER — Emergency Department
Admission: EM | Admit: 2015-10-03 | Discharge: 2015-10-03 | Disposition: A | Discharge status: Home / Self Care | Payer: PPO | Attending: Emergency Medicine | Admitting: Emergency Medicine

## 2015-10-03 DIAGNOSIS — Z7984 Long term (current) use of oral hypoglycemic drugs: Secondary | ICD-10-CM | POA: Diagnosis not present

## 2015-10-03 DIAGNOSIS — L03116 Cellulitis of left lower limb: Secondary | ICD-10-CM

## 2015-10-03 DIAGNOSIS — Z79899 Other long term (current) drug therapy: Secondary | ICD-10-CM | POA: Diagnosis not present

## 2015-10-03 DIAGNOSIS — L089 Local infection of the skin and subcutaneous tissue, unspecified: Secondary | ICD-10-CM | POA: Diagnosis not present

## 2015-10-03 DIAGNOSIS — E119 Type 2 diabetes mellitus without complications: Secondary | ICD-10-CM | POA: Diagnosis not present

## 2015-10-03 LAB — COMPREHENSIVE METABOLIC PANEL
ALT: 19 U/L (ref 17–63)
AST: 19 U/L (ref 15–41)
Albumin: 4.4 g/dL (ref 3.5–5.0)
Alkaline Phosphatase: 74 U/L (ref 38–126)
Anion gap: 4 — ABNORMAL LOW (ref 5–15)
BUN: 19 mg/dL (ref 6–20)
CO2: 28 mmol/L (ref 22–32)
Calcium: 9.2 mg/dL (ref 8.9–10.3)
Chloride: 106 mmol/L (ref 101–111)
Creatinine, Ser: 0.99 mg/dL (ref 0.61–1.24)
GFR calc Af Amer: >60 mL/min (ref >60)
GFR calc non Af Amer: >60 mL/min (ref >60)
Glucose, Bld: 154 mg/dL — ABNORMAL HIGH (ref 65–99)
Potassium: 3.6 mmol/L (ref 3.5–5.1)
Sodium: 138 mmol/L (ref 135–145)
Total Bilirubin: 1 mg/dL (ref 0.3–1.2)
Total Protein: 8.4 g/dL — ABNORMAL HIGH (ref 6.5–8.1)

## 2015-10-03 LAB — CBC
HCT: 43.7 % (ref 40.0–52.0)
Hemoglobin: 15 g/dL (ref 13.0–18.0)
MCH: 28.6 pg (ref 26.0–34.0)
MCHC: 34.2 g/dL (ref 32.0–36.0)
MCV: 83.5 fL (ref 80.0–100.0)
Platelets: 252 10*3/uL (ref 150–440)
RBC: 5.23 MIL/uL (ref 4.40–5.90)
RDW: 14.3 % (ref 11.5–14.5)
WBC: 8.9 10*3/uL (ref 3.8–10.6)

## 2015-10-03 LAB — LACTIC ACID, PLASMA: Lactic Acid, Venous: 0.8 mmol/L (ref 0.5–1.9)

## 2015-10-03 MED ORDER — BACITRACIN ZINC 500 UNIT/GM EX OINT
TOPICAL_OINTMENT | CUTANEOUS | Status: AC
  Administered 2015-10-03: 2
  Filled 2015-10-03: qty 1.8

## 2015-10-03 MED ORDER — CLINDAMYCIN PHOSPHATE 600 MG/50ML IV SOLN
600.0000 mg | Freq: Once | INTRAVENOUS | Status: AC
  Administered 2015-10-03: 600 mg via INTRAVENOUS
  Filled 2015-10-03: qty 50

## 2015-10-03 MED ORDER — CLINDAMYCIN HCL 300 MG PO CAPS: 300.0000 mg | ORAL_CAPSULE | Freq: Three times a day (TID) | ORAL | 0 refills | Status: AC
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
## 2015-10-03 NOTE — Discharge Instructions (Signed)

## 2015-10-03 NOTE — ED Notes (Signed)
Pt son called and reports officers at his house to file IVC paperwork on father. Pt reports father tried committing suicide a few years ago, is very manipulative, and is under the influence of drugs. Son hung-up without leaving number.
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
## 2015-10-08 ENCOUNTER — Ambulatory Visit: Payer: Self-pay | Admitting: Surgery

## 2015-10-08 LAB — CULTURE, BLOOD (ROUTINE X 2)
Culture: NO GROWTH
Culture: NO GROWTH

## 2015-12-24 DIAGNOSIS — M545 Low back pain: Secondary | ICD-10-CM | POA: Diagnosis not present

## 2015-12-24 DIAGNOSIS — G894 Chronic pain syndrome: Secondary | ICD-10-CM | POA: Diagnosis not present

## 2015-12-24 DIAGNOSIS — M531 Cervicobrachial syndrome: Secondary | ICD-10-CM | POA: Diagnosis not present

## 2015-12-24 DIAGNOSIS — G9050 Complex regional pain syndrome I, unspecified: Secondary | ICD-10-CM | POA: Diagnosis not present

## 2015-12-24 DIAGNOSIS — M25552 Pain in left hip: Secondary | ICD-10-CM | POA: Diagnosis not present

## 2015-12-24 DIAGNOSIS — F419 Anxiety disorder, unspecified: Secondary | ICD-10-CM | POA: Diagnosis not present

## 2015-12-24 DIAGNOSIS — M170 Bilateral primary osteoarthritis of knee: Secondary | ICD-10-CM | POA: Diagnosis not present

## 2015-12-24 DIAGNOSIS — M1991 Primary osteoarthritis, unspecified site: Secondary | ICD-10-CM | POA: Diagnosis not present

## 2015-12-24 DIAGNOSIS — M5136 Other intervertebral disc degeneration, lumbar region: Secondary | ICD-10-CM | POA: Diagnosis not present

## 2015-12-24 DIAGNOSIS — F4001 Agoraphobia with panic disorder: Secondary | ICD-10-CM | POA: Diagnosis not present

## 2015-12-24 DIAGNOSIS — G8929 Other chronic pain: Secondary | ICD-10-CM | POA: Diagnosis not present

## 2015-12-24 DIAGNOSIS — M4802 Spinal stenosis, cervical region: Secondary | ICD-10-CM | POA: Diagnosis not present

## 2016-01-11 DIAGNOSIS — B1920 Unspecified viral hepatitis C without hepatic coma: Secondary | ICD-10-CM

## 2016-01-11 HISTORY — DX: Unspecified viral hepatitis C without hepatic coma: B19.20

## 2016-01-13 ENCOUNTER — Telehealth (INDEPENDENT_AMBULATORY_CARE_PROVIDER_SITE_OTHER): Payer: Self-pay

## 2016-01-13 NOTE — Telephone Encounter (Signed)
Patient called about having some feet pain with some toe numbness.He was asking to have medication dosage to be increase,but also stated that he was going to a pain management clinic.I spoke with KS and she advise for  him to get an abi ultrasound with follow up.

## 2016-01-15 DIAGNOSIS — E1142 Type 2 diabetes mellitus with diabetic polyneuropathy: Secondary | ICD-10-CM | POA: Diagnosis not present

## 2016-01-15 DIAGNOSIS — L299 Pruritus, unspecified: Secondary | ICD-10-CM | POA: Diagnosis not present

## 2016-01-21 ENCOUNTER — Encounter (INDEPENDENT_AMBULATORY_CARE_PROVIDER_SITE_OTHER): Payer: PPO

## 2016-01-21 ENCOUNTER — Ambulatory Visit (INDEPENDENT_AMBULATORY_CARE_PROVIDER_SITE_OTHER): Payer: PPO | Admitting: Vascular Surgery

## 2016-01-21 ENCOUNTER — Other Ambulatory Visit (INDEPENDENT_AMBULATORY_CARE_PROVIDER_SITE_OTHER): Payer: Self-pay | Admitting: Vascular Surgery

## 2016-01-21 DIAGNOSIS — M79606 Pain in leg, unspecified: Secondary | ICD-10-CM

## 2016-02-18 DIAGNOSIS — M545 Low back pain: Secondary | ICD-10-CM | POA: Diagnosis not present

## 2016-02-18 DIAGNOSIS — F419 Anxiety disorder, unspecified: Secondary | ICD-10-CM | POA: Diagnosis not present

## 2016-02-18 DIAGNOSIS — M25552 Pain in left hip: Secondary | ICD-10-CM | POA: Diagnosis not present

## 2016-02-18 DIAGNOSIS — M531 Cervicobrachial syndrome: Secondary | ICD-10-CM | POA: Diagnosis not present

## 2016-02-18 DIAGNOSIS — G894 Chronic pain syndrome: Secondary | ICD-10-CM | POA: Diagnosis not present

## 2016-02-18 DIAGNOSIS — F4001 Agoraphobia with panic disorder: Secondary | ICD-10-CM | POA: Diagnosis not present

## 2016-02-18 DIAGNOSIS — G8929 Other chronic pain: Secondary | ICD-10-CM | POA: Diagnosis not present

## 2016-02-18 DIAGNOSIS — M5136 Other intervertebral disc degeneration, lumbar region: Secondary | ICD-10-CM | POA: Diagnosis not present

## 2016-02-18 DIAGNOSIS — E1142 Type 2 diabetes mellitus with diabetic polyneuropathy: Secondary | ICD-10-CM | POA: Diagnosis not present

## 2016-02-18 DIAGNOSIS — F410 Panic disorder [episodic paroxysmal anxiety] without agoraphobia: Secondary | ICD-10-CM | POA: Diagnosis not present

## 2016-02-18 DIAGNOSIS — G9050 Complex regional pain syndrome I, unspecified: Secondary | ICD-10-CM | POA: Diagnosis not present

## 2016-02-18 DIAGNOSIS — M4802 Spinal stenosis, cervical region: Secondary | ICD-10-CM | POA: Diagnosis not present

## 2016-03-01 DIAGNOSIS — F4001 Agoraphobia with panic disorder: Secondary | ICD-10-CM | POA: Diagnosis not present

## 2016-03-01 DIAGNOSIS — F410 Panic disorder [episodic paroxysmal anxiety] without agoraphobia: Secondary | ICD-10-CM | POA: Diagnosis not present

## 2016-03-01 DIAGNOSIS — F419 Anxiety disorder, unspecified: Secondary | ICD-10-CM | POA: Diagnosis not present

## 2016-03-01 DIAGNOSIS — G9050 Complex regional pain syndrome I, unspecified: Secondary | ICD-10-CM | POA: Diagnosis not present

## 2016-03-01 DIAGNOSIS — M545 Low back pain: Secondary | ICD-10-CM | POA: Diagnosis not present

## 2016-03-01 DIAGNOSIS — G8929 Other chronic pain: Secondary | ICD-10-CM | POA: Diagnosis not present

## 2016-03-01 DIAGNOSIS — E1142 Type 2 diabetes mellitus with diabetic polyneuropathy: Secondary | ICD-10-CM | POA: Diagnosis not present

## 2016-03-01 DIAGNOSIS — M4802 Spinal stenosis, cervical region: Secondary | ICD-10-CM | POA: Diagnosis not present

## 2016-03-01 DIAGNOSIS — M531 Cervicobrachial syndrome: Secondary | ICD-10-CM | POA: Diagnosis not present

## 2016-03-01 DIAGNOSIS — G894 Chronic pain syndrome: Secondary | ICD-10-CM | POA: Diagnosis not present

## 2016-03-01 DIAGNOSIS — M25552 Pain in left hip: Secondary | ICD-10-CM | POA: Diagnosis not present

## 2016-03-01 DIAGNOSIS — M5136 Other intervertebral disc degeneration, lumbar region: Secondary | ICD-10-CM | POA: Diagnosis not present

## 2016-03-31 DIAGNOSIS — M4802 Spinal stenosis, cervical region: Secondary | ICD-10-CM | POA: Diagnosis not present

## 2016-03-31 DIAGNOSIS — F4001 Agoraphobia with panic disorder: Secondary | ICD-10-CM | POA: Diagnosis not present

## 2016-03-31 DIAGNOSIS — E1142 Type 2 diabetes mellitus with diabetic polyneuropathy: Secondary | ICD-10-CM | POA: Diagnosis not present

## 2016-03-31 DIAGNOSIS — M545 Low back pain: Secondary | ICD-10-CM | POA: Diagnosis not present

## 2016-03-31 DIAGNOSIS — M5136 Other intervertebral disc degeneration, lumbar region: Secondary | ICD-10-CM | POA: Diagnosis not present

## 2016-03-31 DIAGNOSIS — F410 Panic disorder [episodic paroxysmal anxiety] without agoraphobia: Secondary | ICD-10-CM | POA: Diagnosis not present

## 2016-03-31 DIAGNOSIS — F419 Anxiety disorder, unspecified: Secondary | ICD-10-CM | POA: Diagnosis not present

## 2016-03-31 DIAGNOSIS — M25552 Pain in left hip: Secondary | ICD-10-CM | POA: Diagnosis not present

## 2016-03-31 DIAGNOSIS — M531 Cervicobrachial syndrome: Secondary | ICD-10-CM | POA: Diagnosis not present

## 2016-03-31 DIAGNOSIS — G894 Chronic pain syndrome: Secondary | ICD-10-CM | POA: Diagnosis not present

## 2016-03-31 DIAGNOSIS — G9050 Complex regional pain syndrome I, unspecified: Secondary | ICD-10-CM | POA: Diagnosis not present

## 2016-03-31 DIAGNOSIS — G8929 Other chronic pain: Secondary | ICD-10-CM | POA: Diagnosis not present

## 2016-04-13 DIAGNOSIS — G894 Chronic pain syndrome: Secondary | ICD-10-CM | POA: Diagnosis not present

## 2016-04-13 DIAGNOSIS — F411 Generalized anxiety disorder: Secondary | ICD-10-CM | POA: Diagnosis not present

## 2016-04-13 DIAGNOSIS — M24662 Ankylosis, left knee: Secondary | ICD-10-CM | POA: Diagnosis not present

## 2016-04-13 DIAGNOSIS — E119 Type 2 diabetes mellitus without complications: Secondary | ICD-10-CM | POA: Diagnosis not present

## 2016-04-13 DIAGNOSIS — M25562 Pain in left knee: Secondary | ICD-10-CM | POA: Diagnosis not present

## 2016-04-13 DIAGNOSIS — R200 Anesthesia of skin: Secondary | ICD-10-CM | POA: Diagnosis not present

## 2016-04-13 DIAGNOSIS — Z96652 Presence of left artificial knee joint: Secondary | ICD-10-CM | POA: Insufficient documentation

## 2016-04-14 ENCOUNTER — Other Ambulatory Visit: Payer: Self-pay | Admitting: Physician Assistant

## 2016-04-14 DIAGNOSIS — M24662 Ankylosis, left knee: Secondary | ICD-10-CM

## 2016-04-14 DIAGNOSIS — Z96652 Presence of left artificial knee joint: Secondary | ICD-10-CM

## 2016-04-19 DIAGNOSIS — E119 Type 2 diabetes mellitus without complications: Secondary | ICD-10-CM | POA: Diagnosis not present

## 2016-04-19 DIAGNOSIS — F411 Generalized anxiety disorder: Secondary | ICD-10-CM | POA: Diagnosis not present

## 2016-04-21 ENCOUNTER — Ambulatory Visit (INDEPENDENT_AMBULATORY_CARE_PROVIDER_SITE_OTHER): Payer: PPO | Admitting: Vascular Surgery

## 2016-04-21 ENCOUNTER — Ambulatory Visit (INDEPENDENT_AMBULATORY_CARE_PROVIDER_SITE_OTHER): Payer: PPO

## 2016-04-21 ENCOUNTER — Encounter (INDEPENDENT_AMBULATORY_CARE_PROVIDER_SITE_OTHER): Payer: Self-pay | Admitting: Vascular Surgery

## 2016-04-21 VITALS — BP 161/93 | HR 74 | Resp 16 | Ht 71.0 in | Wt 203.0 lb

## 2016-04-21 DIAGNOSIS — M79606 Pain in leg, unspecified: Secondary | ICD-10-CM

## 2016-04-21 DIAGNOSIS — G894 Chronic pain syndrome: Secondary | ICD-10-CM

## 2016-04-21 DIAGNOSIS — E118 Type 2 diabetes mellitus with unspecified complications: Secondary | ICD-10-CM | POA: Diagnosis not present

## 2016-04-21 DIAGNOSIS — M79605 Pain in left leg: Secondary | ICD-10-CM | POA: Diagnosis not present

## 2016-04-21 DIAGNOSIS — M79604 Pain in right leg: Secondary | ICD-10-CM

## 2016-05-02 ENCOUNTER — Encounter
Admission: RE | Admit: 2016-05-02 | Discharge: 2016-05-02 | Disposition: A | Discharge status: Home / Self Care | Payer: PPO | Source: Ambulatory Visit | Attending: Physician Assistant | Admitting: Physician Assistant

## 2016-05-02 ENCOUNTER — Ambulatory Visit
Admission: RE | Admit: 2016-05-02 | Discharge: 2016-05-02 | Disposition: A | Discharge status: Home / Self Care | Payer: PPO | Source: Ambulatory Visit | Attending: Physician Assistant | Admitting: Physician Assistant

## 2016-05-02 DIAGNOSIS — Z96652 Presence of left artificial knee joint: Secondary | ICD-10-CM

## 2016-05-02 DIAGNOSIS — M24662 Ankylosis, left knee: Secondary | ICD-10-CM | POA: Diagnosis not present

## 2016-05-02 DIAGNOSIS — R948 Abnormal results of function studies of other organs and systems: Secondary | ICD-10-CM | POA: Insufficient documentation

## 2016-05-02 DIAGNOSIS — S3992XA Unspecified injury of lower back, initial encounter: Secondary | ICD-10-CM | POA: Diagnosis not present

## 2016-05-02 MED ORDER — TECHNETIUM TC 99M MEDRONATE IV KIT
22.3800 | PACK | Freq: Once | INTRAVENOUS | Status: AC | PRN
  Administered 2016-05-02: 22.38 via INTRAVENOUS

## 2016-05-04 ENCOUNTER — Other Ambulatory Visit: Payer: Self-pay

## 2016-05-04 DIAGNOSIS — M79605 Pain in left leg: Secondary | ICD-10-CM | POA: Insufficient documentation

## 2016-05-04 DIAGNOSIS — M79604 Pain in right leg: Secondary | ICD-10-CM | POA: Insufficient documentation

## 2016-05-04 NOTE — Progress Notes (Signed)
Subjective:    Patient ID: Erik Martinez, male    DOB: [birth year], 61 y.o.   MRN: [id number] Chief Complaint  Patient presents with  . Re-evaluation    Leg pain and numbness   Patient presents with a chief complaint of bilateral lower extremity "pain". He was last seen over six months ago. Patient continues to have lower extremity pain and discomfort. ABI's completed today are notable for non-compressible arteries due to medial calcification with normal toe and PPG waveforms. Triphasic waveforms noted in anterior and posterior tibial arteries. Awaiting referral to new pain clinic. Patient with continued lower extremity pain and numbness worsened from last visit. Last visit ABI also within normal limits. No rest pain or ulceration to the lower extremity. Denies fever, nausea or vomiting.    Review of Systems  Constitutional: Negative.   HENT: Negative.   Eyes: Negative.   Respiratory: Negative.   Cardiovascular:       Lower extremity pain  Gastrointestinal: Negative.   Endocrine: Negative.   Genitourinary: Negative.   Musculoskeletal: Negative.   Skin: Negative.   Allergic/Immunologic: Negative.   Neurological: Negative.   Hematological: Negative.   Psychiatric/Behavioral: Negative.       Objective:   Physical Exam  Constitutional: He is oriented to person, place, and time. He appears well-developed and well-nourished. No distress.  HENT:  Head: Normocephalic and atraumatic.  Right Ear: External ear normal.  Left Ear: External ear normal.  Eyes: Conjunctivae are normal. Pupils are equal, round, and reactive to light.  Neck: Normal range of motion.  Cardiovascular: Normal rate, regular rhythm, normal heart sounds and intact distal pulses.   Pulses:      Radial pulses are 2+ on the right side, and 2+ on the left side.       Dorsalis pedis pulses are 2+ on the right side, and 2+ on the left side.       Posterior tibial pulses are 2+ on the right side, and 2+ on the left  side.  Pulmonary/Chest: Effort normal.  Musculoskeletal: Normal range of motion. He exhibits no edema.  Neurological: He is alert and oriented to person, place, and time.  Skin: Skin is warm and dry. He is not diaphoretic.  Psychiatric: He has a normal mood and affect. His behavior is normal. Judgment and thought content normal.  Vitals reviewed.  BP (!) 161/93 (BP Location: Right Arm)   Pulse 74   Resp 16   Ht 5\' 11"  (1.803 m)   Wt 203 lb (92.1 kg)   BMI 28.31 kg/m   Past Medical History:  Diagnosis Date  . Anxiety   . Arthritis   . Avascular necrosis of bones of both hips ([place])   . Bipolar disorder ([place])   . Bladder spasms   . Chronic pain of left knee   . Chronic pain syndrome    PAIN CLINIC  . DDD (degenerative disc disease)    SPINE  . Depression   . Diabetes mellitus without complication ([place])   . Frequency-urgency syndrome   . GERD (gastroesophageal reflux disease)   . History of hepatitis B    1983  TX'D--  NO ISSUES OR SYMPTOMS SINCE  . Hypotestosteronemia   . Nocturia   . PONV (postoperative nausea and vomiting)    "difficult to put me to sleep"   Social History   Social History  . Marital status: Divorced    Spouse name: N/A  . Number of children: N/A  . Years of  education: N/A   Occupational History  . Not on file.   Social History Main Topics  . Smoking status: Never Smoker  . Smokeless tobacco: Never Used  . Alcohol use Yes     Comment: "sometimes"  . Drug use: Yes    Types: Cocaine  . Sexual activity: No   Other Topics Concern  . Not on file   Social History Narrative  . No narrative on file   Past Surgical History:  Procedure Laterality Date  . BACK SURGERY     2 rods and 4 screws artificial disc  . FRACTURE SURGERY    . INTERSTIM IMPLANT PLACEMENT  2007  . INTERSTIM IMPLANT REVISION N/A 08/17/2012   Procedure: REPLACMENT OF IPG PLUS REPLACE LEAD OF INTERSTIM IMPLANT ;  Surgeon: [name], MD;  Location: [facility];  Service: Urology;  Laterality: N/A;  . JOINT REPLACEMENT     Lt knee and Lt hip  . LUMBAR DISC SURGERY  2005   L5  . LUMBAR FUSION  X2  2006  &  2007  . OPEN DEBRIDEMENT LEFT TOTAL KNEE (SCAR, BONEY GRAOWTH)/ REMOVAL OLD SUTURES  05-01-2007  . PARTIAL HIP ARTHROPLASTY Left 2001  . SHOULDER OPEN ROTATOR CUFF REPAIR Left 2000  . TOTAL HIP ARTHROPLASTY Left 2002  . TOTAL KNEE ARTHROPLASTY Left    PARTIAL LEFT KNEE REPLACEMENT PRIOR TO THIS  . TOTAL KNEE REVISION Left 2008  . TRIGGER FINGER RELEASE Bilateral    SEVERAL FINGERS  . TRIGGER FINGER RELEASE Right 06/18/2014   Procedure: RELEASE TRIGGER FINGER/A-1 PULLEY;  Surgeon: [name], MD;  Location: [facility];  Service: Orthopedics;  Laterality: Right;   Family History  Problem Relation Age of Onset  . Diabetes Mother    Allergies  Allergen Reactions  . Codeine Rash  . Morphine And Related Itching and Rash      Assessment & Plan:  Patient presents with a chief complaint of bilateral lower extremity "pain". He was last seen over six months ago. Patient continues to have lower extremity pain and discomfort. ABI's completed today are notable for non-compressible arteries due to medial calcification with normal toe and PPG waveforms. Triphasic waveforms noted in anterior and posterior tibial arteries. Awaiting referral to new pain clinic. Patient with continued lower extremity pain and numbness worsened from last visit. Last visit ABI also within normal limits. No rest pain or ulceration to the lower extremity. Denies fever, nausea or vomiting.   1. Chronic pain syndrome - Stable Patient awaiting new pain clinic. Past medical Hx of chronic pain issues.  2. Pain in both lower extremities - Stable ABI and physical exam within normal limits. Possible neurological or neurosurgical etiology. Will refer. Patient to follow up PRN  3. Type 2 diabetes mellitus with complication, unspecified whether long term insulin use  (HCC) - Stable Encouraged good control as its slows the progression of atherosclerotic disease  Current Outpatient Prescriptions on File Prior to Visit  Medication Sig Dispense Refill  . amoxicillin-clavulanate (AUGMENTIN) 875-125 MG tablet Take 1 tablet by mouth every 12 (twelve) hours. 30 tablet 0  . bisacodyl (DULCOLAX) 5 MG EC tablet Take 5 mg by mouth every other day.    . docusate sodium (COLACE) 100 MG capsule Take 100 mg by mouth every other day.    Marland Kitchen glipiZIDE (GLUCOTROL) 10 MG tablet Take 10 mg by mouth daily before breakfast.    . metFORMIN (GLUCOPHAGE) 500 MG tablet Take 500 mg by mouth  2 (two) times daily with a meal.    . Multiple Vitamins-Minerals (CENTRUM SILVER PO) Take 1 tablet by mouth daily.    . naloxegol oxalate (MOVANTIK) 25 MG TABS tablet Take 25 mg by mouth daily.    . OxyCODONE (OXYCONTIN) 80 mg T12A 12 hr tablet Take 80 mg by mouth every 12 (twelve) hours.    Marland Kitchen oxycodone (ROXICODONE) 30 MG immediate release tablet Take 60 mg by mouth every 6 (six) hours.    . pantoprazole (PROTONIX) 40 MG tablet Take 40 mg by mouth daily.    . traZODone (DESYREL) 100 MG tablet Take 100 mg by mouth at bedtime.     No current facility-administered medications on file prior to visit.     There are no Patient Instructions on file for this visit. No Follow-up on file.   Leeanna Slaby A Katelind Pytel, PA-C

## 2016-05-16 DIAGNOSIS — M659 Synovitis and tenosynovitis, unspecified: Secondary | ICD-10-CM | POA: Diagnosis not present

## 2016-05-24 DIAGNOSIS — Z96652 Presence of left artificial knee joint: Secondary | ICD-10-CM | POA: Diagnosis not present

## 2016-05-24 DIAGNOSIS — T84038A Mechanical loosening of other internal prosthetic joint, initial encounter: Secondary | ICD-10-CM | POA: Diagnosis not present

## 2016-06-02 DIAGNOSIS — M531 Cervicobrachial syndrome: Secondary | ICD-10-CM | POA: Diagnosis not present

## 2016-06-02 DIAGNOSIS — E1142 Type 2 diabetes mellitus with diabetic polyneuropathy: Secondary | ICD-10-CM | POA: Diagnosis not present

## 2016-06-02 DIAGNOSIS — M4802 Spinal stenosis, cervical region: Secondary | ICD-10-CM | POA: Diagnosis not present

## 2016-06-02 DIAGNOSIS — F419 Anxiety disorder, unspecified: Secondary | ICD-10-CM | POA: Diagnosis not present

## 2016-06-02 DIAGNOSIS — G8929 Other chronic pain: Secondary | ICD-10-CM | POA: Diagnosis not present

## 2016-06-02 DIAGNOSIS — Z79891 Long term (current) use of opiate analgesic: Secondary | ICD-10-CM | POA: Diagnosis not present

## 2016-06-02 DIAGNOSIS — F4001 Agoraphobia with panic disorder: Secondary | ICD-10-CM | POA: Diagnosis not present

## 2016-06-02 DIAGNOSIS — M25552 Pain in left hip: Secondary | ICD-10-CM | POA: Diagnosis not present

## 2016-06-02 DIAGNOSIS — F410 Panic disorder [episodic paroxysmal anxiety] without agoraphobia: Secondary | ICD-10-CM | POA: Diagnosis not present

## 2016-06-02 DIAGNOSIS — G9050 Complex regional pain syndrome I, unspecified: Secondary | ICD-10-CM | POA: Diagnosis not present

## 2016-06-02 DIAGNOSIS — G894 Chronic pain syndrome: Secondary | ICD-10-CM | POA: Diagnosis not present

## 2016-06-02 DIAGNOSIS — M545 Low back pain: Secondary | ICD-10-CM | POA: Diagnosis not present

## 2016-06-02 DIAGNOSIS — M5136 Other intervertebral disc degeneration, lumbar region: Secondary | ICD-10-CM | POA: Diagnosis not present

## 2016-07-06 ENCOUNTER — Inpatient Hospital Stay: Admission: RE | Admit: 2016-07-06 | Payer: Self-pay | Source: Ambulatory Visit

## 2016-07-12 ENCOUNTER — Inpatient Hospital Stay: Admission: RE | Admit: 2016-07-12 | Payer: Self-pay | Source: Ambulatory Visit

## 2016-07-15 ENCOUNTER — Encounter
Admission: RE | Admit: 2016-07-15 | Discharge: 2016-07-15 | Disposition: A | Discharge status: Home / Self Care | Payer: PPO | Source: Ambulatory Visit | Attending: Orthopedic Surgery | Admitting: Orthopedic Surgery

## 2016-07-15 DIAGNOSIS — Z01818 Encounter for other preprocedural examination: Secondary | ICD-10-CM | POA: Diagnosis not present

## 2016-07-15 DIAGNOSIS — E119 Type 2 diabetes mellitus without complications: Secondary | ICD-10-CM | POA: Diagnosis not present

## 2016-07-15 DIAGNOSIS — R9431 Abnormal electrocardiogram [ECG] [EKG]: Secondary | ICD-10-CM | POA: Diagnosis not present

## 2016-07-15 HISTORY — DX: Inflammatory liver disease, unspecified: K75.9

## 2016-07-15 LAB — URINE DRUG SCREEN, QUALITATIVE (ARMC ONLY)
Amphetamines, Ur Screen: NOT DETECTED
Barbiturates, Ur Screen: NOT DETECTED
Benzodiazepine, Ur Scrn: POSITIVE — AB
Cannabinoid 50 Ng, Ur ~~LOC~~: NOT DETECTED
Cocaine Metabolite,Ur ~~LOC~~: POSITIVE — AB
MDMA (Ecstasy)Ur Screen: NOT DETECTED
Methadone Scn, Ur: NOT DETECTED
Opiate, Ur Screen: NOT DETECTED
Phencyclidine (PCP) Ur S: NOT DETECTED
Tricyclic, Ur Screen: NOT DETECTED

## 2016-07-15 LAB — URINALYSIS, ROUTINE W REFLEX MICROSCOPIC
Bilirubin Urine: NEGATIVE
Glucose, UA: 50 mg/dL — AB
Hgb urine dipstick: NEGATIVE
Ketones, ur: 5 mg/dL — AB
Leukocytes, UA: NEGATIVE
Nitrite: NEGATIVE
Protein, ur: NEGATIVE mg/dL
Specific Gravity, Urine: 1.023 (ref 1.005–1.030)
pH: 5 (ref 5.0–8.0)

## 2016-07-15 LAB — COMPREHENSIVE METABOLIC PANEL
ALT: 67 U/L — ABNORMAL HIGH (ref 17–63)
AST: 45 U/L — ABNORMAL HIGH (ref 15–41)
Albumin: 3.8 g/dL (ref 3.5–5.0)
Alkaline Phosphatase: 53 U/L (ref 38–126)
Anion gap: 8 (ref 5–15)
BUN: 24 mg/dL — ABNORMAL HIGH (ref 6–20)
CO2: 24 mmol/L (ref 22–32)
Calcium: 9 mg/dL (ref 8.9–10.3)
Chloride: 108 mmol/L (ref 101–111)
Creatinine, Ser: 1.23 mg/dL (ref 0.61–1.24)
GFR calc Af Amer: >60 mL/min (ref >60)
GFR calc non Af Amer: >60 mL/min (ref >60)
Glucose, Bld: 144 mg/dL — ABNORMAL HIGH (ref 65–99)
Potassium: 3.8 mmol/L (ref 3.5–5.1)
Sodium: 140 mmol/L (ref 135–145)
Total Bilirubin: 0.5 mg/dL (ref 0.3–1.2)
Total Protein: 6.7 g/dL (ref 6.5–8.1)

## 2016-07-15 LAB — APTT: aPTT: 29 seconds (ref 24–36)

## 2016-07-15 LAB — CBC
HCT: 36.8 % — ABNORMAL LOW (ref 40.0–52.0)
Hemoglobin: 12.9 g/dL — ABNORMAL LOW (ref 13.0–18.0)
MCH: 29.6 pg (ref 26.0–34.0)
MCHC: 35 g/dL (ref 32.0–36.0)
MCV: 84.7 fL (ref 80.0–100.0)
Platelets: 185 10*3/uL (ref 150–440)
RBC: 4.35 MIL/uL — ABNORMAL LOW (ref 4.40–5.90)
RDW: 14.5 % (ref 11.5–14.5)
WBC: 6.9 10*3/uL (ref 3.8–10.6)

## 2016-07-15 LAB — PROTIME-INR
INR: 1.04
Prothrombin Time: 13.6 seconds (ref 11.4–15.2)

## 2016-07-15 LAB — TYPE AND SCREEN
ABO/RH(D): A POS
Antibody Screen: NEGATIVE

## 2016-07-15 LAB — C-REACTIVE PROTEIN: CRP: <0.8 mg/dL (ref <1.0)

## 2016-07-15 LAB — SEDIMENTATION RATE: Sed Rate: 16 mm/hr (ref 0–20)

## 2016-07-15 LAB — SURGICAL PCR SCREEN
MRSA, PCR: NEGATIVE
Staphylococcus aureus: NEGATIVE

## 2016-07-15 NOTE — Patient Instructions (Signed)
  Your procedure is scheduled on: 07-27-16 [facility] Report to Same Day Surgery 2nd floor medical mall [facility] Entrance-take elevator on left to 2nd floor.  Check in with surgery information desk.) To find out your arrival time please call [phone] between 1PM - 3PM on 07-26-16 TUESDAY  Remember: Instructions that are not followed completely may result in serious medical risk, up to and including death, or upon the discretion of your surgeon and anesthesiologist your surgery may need to be rescheduled.    _x___ 1. Do not eat food or drink liquids after midnight. No gum chewing or hard candies.     __x__ 2. No Alcohol for 24 hours before or after surgery.   __x__3. No Smoking for 24 prior to surgery.   ____  4. Bring all medications with you on the day of surgery if instructed.    __x__ 5. Notify your doctor if there is any change in your medical condition     (cold, fever, infections).     Do not wear jewelry, make-up, hairpins, clips or nail polish.  Do not wear lotions, powders, or perfumes. You may wear deodorant.  Do not shave 48 hours prior to surgery. Men may shave face and neck.  Do not bring valuables to the hospital.    [facility] is not responsible for any belongings or valuables.               Contacts, dentures or bridgework may not be worn into surgery.  Leave your suitcase in the car. After surgery it may be brought to your room.  For patients admitted to the hospital, discharge time is determined by your treatment team.   Patients discharged the day of surgery will not be allowed to drive home.  You will need someone to drive you home and stay with you the night of your procedure.    Please read over the following fact sheets that you were given:   [facility] Preparing for Surgery and or MRSA Information   _x___ TAKE THE FOLLOWING MEDICATIONS THE MORNING OF SURGERY WITH A SMALL SIP OF WATER. These include:  1. GABAPENTIN  2. LYRICA  3. OXYCODONE IR  4.  ZANTAC  5. TAKE AN EXTRA ZANTAC Tuesday NIGHT BEFORE BED   6.  ____Fleets enema or Magnesium Citrate as directed.   _x___ Use CHG Soap or sage wipes as directed on instruction sheet   ____ Use inhalers on the day of surgery and bring to hospital day of surgery  _X___ Stop Metformin and Janumet 2 days prior to surgery-LAST DOSE OF METFORMIN ON Sunday, June 15TH   ____ Take 1/2 of usual insulin dose the night before surgery and none on the morning surgery.   ____ Follow recommendations from Cardiologist, Pulmonologist or PCP regarding stopping Aspirin, Coumadin, Pllavix ,Eliquis, Effient, or Pradaxa, and Pletal.  X____Stop Anti-inflammatories such as Advil, Aleve, Ibuprofen, Motrin, NAPROXEN, Naprosyn, Goodies powders or aspirin products 7 DAYS PRIOR TO SURGERY-OK to take Tylenol/OXYCODONE   ____ Stop supplements until after surgery.  .   ____ Bring C-Pap to the hospital.

## 2016-07-15 NOTE — Pre-Procedure Instructions (Signed)
Patient did not bring his medications to PAT visit as requested per Dr Marry Guan. Patient informed Dr Marry Guan requires patients to bring their medications to this visit. He stated he knew them all and was told by someone he spoke with on the phone all he needed would be a list. Patient offered to go home and get his medications. He was informed he may have to be rescheduled for later today due to other patient appointments. Patient left abruptly. Will notify Tiffany at [facility] office and see if patient is willing to come back this afternoon with his medications.

## 2016-07-16 LAB — URINE CULTURE
Culture: NO GROWTH
Special Requests: NORMAL

## 2016-07-16 LAB — HEPATITIS PANEL, ACUTE
HCV Ab: <0.1 s/co ratio (ref 0.0–0.9)
Hep A IgM: NEGATIVE
Hep B C IgM: NEGATIVE
Hepatitis B Surface Ag: NEGATIVE

## 2016-07-16 LAB — HEMOGLOBIN A1C
Hgb A1c MFr Bld: 7.3 % — ABNORMAL HIGH (ref 4.8–5.6)
Mean Plasma Glucose: 163 mg/dL

## 2016-07-22 ENCOUNTER — Other Ambulatory Visit (INDEPENDENT_AMBULATORY_CARE_PROVIDER_SITE_OTHER): Payer: Self-pay | Admitting: Vascular Surgery

## 2016-07-22 DIAGNOSIS — I739 Peripheral vascular disease, unspecified: Secondary | ICD-10-CM

## 2016-07-26 ENCOUNTER — Ambulatory Visit (INDEPENDENT_AMBULATORY_CARE_PROVIDER_SITE_OTHER): Payer: Self-pay | Admitting: Vascular Surgery

## 2016-07-26 ENCOUNTER — Encounter (INDEPENDENT_AMBULATORY_CARE_PROVIDER_SITE_OTHER): Payer: PPO

## 2016-07-27 ENCOUNTER — Encounter: Admission: RE | Payer: Self-pay | Source: Ambulatory Visit

## 2016-07-27 ENCOUNTER — Inpatient Hospital Stay: Admission: RE | Admit: 2016-07-27 | Payer: PPO | Source: Ambulatory Visit | Admitting: Orthopedic Surgery

## 2016-07-27 SURGERY — TOTAL KNEE REVISION
Anesthesia: Choice | Laterality: Left

## 2016-08-08 DIAGNOSIS — M5440 Lumbago with sciatica, unspecified side: Secondary | ICD-10-CM | POA: Diagnosis not present

## 2016-08-08 DIAGNOSIS — Z9119 Patient's noncompliance with other medical treatment and regimen: Secondary | ICD-10-CM | POA: Diagnosis not present

## 2016-08-08 DIAGNOSIS — Z79899 Other long term (current) drug therapy: Secondary | ICD-10-CM | POA: Diagnosis not present

## 2016-08-08 DIAGNOSIS — G894 Chronic pain syndrome: Secondary | ICD-10-CM | POA: Diagnosis not present

## 2016-08-08 DIAGNOSIS — F1120 Opioid dependence, uncomplicated: Secondary | ICD-10-CM | POA: Diagnosis not present

## 2016-08-08 DIAGNOSIS — M5416 Radiculopathy, lumbar region: Secondary | ICD-10-CM | POA: Diagnosis not present

## 2016-08-08 DIAGNOSIS — G8929 Other chronic pain: Secondary | ICD-10-CM | POA: Diagnosis not present

## 2016-08-08 DIAGNOSIS — F419 Anxiety disorder, unspecified: Secondary | ICD-10-CM | POA: Diagnosis not present

## 2016-08-08 DIAGNOSIS — F319 Bipolar disorder, unspecified: Secondary | ICD-10-CM | POA: Diagnosis not present

## 2016-08-09 DIAGNOSIS — M4802 Spinal stenosis, cervical region: Secondary | ICD-10-CM | POA: Diagnosis not present

## 2016-08-09 DIAGNOSIS — M531 Cervicobrachial syndrome: Secondary | ICD-10-CM | POA: Diagnosis not present

## 2016-08-09 DIAGNOSIS — G9050 Complex regional pain syndrome I, unspecified: Secondary | ICD-10-CM | POA: Diagnosis not present

## 2016-08-09 DIAGNOSIS — F4001 Agoraphobia with panic disorder: Secondary | ICD-10-CM | POA: Diagnosis not present

## 2016-08-09 DIAGNOSIS — G894 Chronic pain syndrome: Secondary | ICD-10-CM | POA: Diagnosis not present

## 2016-08-09 DIAGNOSIS — Z79891 Long term (current) use of opiate analgesic: Secondary | ICD-10-CM | POA: Diagnosis not present

## 2016-08-09 DIAGNOSIS — M545 Low back pain: Secondary | ICD-10-CM | POA: Diagnosis not present

## 2016-08-09 DIAGNOSIS — F419 Anxiety disorder, unspecified: Secondary | ICD-10-CM | POA: Diagnosis not present

## 2016-08-09 DIAGNOSIS — M25552 Pain in left hip: Secondary | ICD-10-CM | POA: Diagnosis not present

## 2016-08-09 DIAGNOSIS — M5416 Radiculopathy, lumbar region: Secondary | ICD-10-CM | POA: Diagnosis not present

## 2016-08-09 DIAGNOSIS — M5136 Other intervertebral disc degeneration, lumbar region: Secondary | ICD-10-CM | POA: Diagnosis not present

## 2016-08-09 DIAGNOSIS — E1142 Type 2 diabetes mellitus with diabetic polyneuropathy: Secondary | ICD-10-CM | POA: Diagnosis not present

## 2016-08-09 DIAGNOSIS — G8929 Other chronic pain: Secondary | ICD-10-CM | POA: Diagnosis not present

## 2016-08-09 DIAGNOSIS — F410 Panic disorder [episodic paroxysmal anxiety] without agoraphobia: Secondary | ICD-10-CM | POA: Diagnosis not present

## 2016-08-29 DIAGNOSIS — R202 Paresthesia of skin: Secondary | ICD-10-CM | POA: Diagnosis not present

## 2016-09-02 DIAGNOSIS — R300 Dysuria: Secondary | ICD-10-CM | POA: Diagnosis not present

## 2016-09-02 DIAGNOSIS — M545 Low back pain: Secondary | ICD-10-CM | POA: Diagnosis not present

## 2016-09-02 DIAGNOSIS — E1142 Type 2 diabetes mellitus with diabetic polyneuropathy: Secondary | ICD-10-CM | POA: Diagnosis not present

## 2016-09-02 DIAGNOSIS — R8290 Unspecified abnormal findings in urine: Secondary | ICD-10-CM | POA: Diagnosis not present

## 2016-09-02 DIAGNOSIS — G8929 Other chronic pain: Secondary | ICD-10-CM | POA: Diagnosis not present

## 2016-09-13 DIAGNOSIS — Z8619 Personal history of other infectious and parasitic diseases: Secondary | ICD-10-CM | POA: Diagnosis not present

## 2016-09-13 DIAGNOSIS — R748 Abnormal levels of other serum enzymes: Secondary | ICD-10-CM | POA: Diagnosis not present

## 2016-09-26 DIAGNOSIS — E349 Endocrine disorder, unspecified: Secondary | ICD-10-CM | POA: Diagnosis not present

## 2016-09-26 DIAGNOSIS — Z125 Encounter for screening for malignant neoplasm of prostate: Secondary | ICD-10-CM | POA: Diagnosis not present

## 2016-09-26 DIAGNOSIS — G8929 Other chronic pain: Secondary | ICD-10-CM | POA: Diagnosis not present

## 2016-09-26 DIAGNOSIS — Z8619 Personal history of other infectious and parasitic diseases: Secondary | ICD-10-CM | POA: Diagnosis not present

## 2016-09-26 DIAGNOSIS — F411 Generalized anxiety disorder: Secondary | ICD-10-CM | POA: Diagnosis not present

## 2016-09-26 DIAGNOSIS — E119 Type 2 diabetes mellitus without complications: Secondary | ICD-10-CM | POA: Diagnosis not present

## 2016-10-05 ENCOUNTER — Encounter: Payer: Self-pay | Admitting: Emergency Medicine

## 2016-10-05 ENCOUNTER — Emergency Department: Payer: PPO

## 2016-10-05 ENCOUNTER — Emergency Department
Admission: EM | Admit: 2016-10-05 | Discharge: 2016-10-05 | Disposition: A | Discharge status: Home / Self Care | Payer: PPO | Attending: Emergency Medicine | Admitting: Emergency Medicine

## 2016-10-05 DIAGNOSIS — Z79899 Other long term (current) drug therapy: Secondary | ICD-10-CM | POA: Insufficient documentation

## 2016-10-05 DIAGNOSIS — W19XXXA Unspecified fall, initial encounter: Secondary | ICD-10-CM | POA: Diagnosis not present

## 2016-10-05 DIAGNOSIS — M5411 Radiculopathy, occipito-atlanto-axial region: Secondary | ICD-10-CM | POA: Diagnosis not present

## 2016-10-05 DIAGNOSIS — Z7984 Long term (current) use of oral hypoglycemic drugs: Secondary | ICD-10-CM | POA: Diagnosis not present

## 2016-10-05 DIAGNOSIS — M5441 Lumbago with sciatica, right side: Secondary | ICD-10-CM | POA: Diagnosis not present

## 2016-10-05 DIAGNOSIS — S3992XA Unspecified injury of lower back, initial encounter: Secondary | ICD-10-CM | POA: Diagnosis not present

## 2016-10-05 DIAGNOSIS — E119 Type 2 diabetes mellitus without complications: Secondary | ICD-10-CM | POA: Insufficient documentation

## 2016-10-05 DIAGNOSIS — M5442 Lumbago with sciatica, left side: Secondary | ICD-10-CM | POA: Diagnosis not present

## 2016-10-05 DIAGNOSIS — G8929 Other chronic pain: Secondary | ICD-10-CM | POA: Diagnosis not present

## 2016-10-05 DIAGNOSIS — Z981 Arthrodesis status: Secondary | ICD-10-CM | POA: Insufficient documentation

## 2016-10-05 DIAGNOSIS — Y9289 Other specified places as the place of occurrence of the external cause: Secondary | ICD-10-CM | POA: Diagnosis not present

## 2016-10-05 DIAGNOSIS — W1831XA Fall on same level due to stepping on an object, initial encounter: Secondary | ICD-10-CM | POA: Insufficient documentation

## 2016-10-05 DIAGNOSIS — M8709 Idiopathic aseptic necrosis of bone, multiple sites: Secondary | ICD-10-CM | POA: Insufficient documentation

## 2016-10-05 DIAGNOSIS — M545 Low back pain: Secondary | ICD-10-CM | POA: Diagnosis present

## 2016-10-05 LAB — GLUCOSE, CAPILLARY: Glucose-Capillary: 138 mg/dL — ABNORMAL HIGH (ref 65–99)

## 2016-10-05 MED ORDER — SODIUM CHLORIDE 0.9 % IV BOLUS (SEPSIS)
1000.0000 mL | Freq: Once | INTRAVENOUS | Status: AC
  Administered 2016-10-05: 1000 mL via INTRAVENOUS

## 2016-10-05 MED ORDER — OXYCODONE HCL 5 MG PO TABS: 10.0000 mg | ORAL_TABLET | Freq: Once | ORAL | Status: DC

## 2016-10-05 MED ORDER — METHOCARBAMOL 750 MG PO TABS: 750.0000 mg | ORAL_TABLET | Freq: Four times a day (QID) | ORAL | 0 refills | Status: DC

## 2016-10-05 MED ORDER — ONDANSETRON 8 MG PO TBDP: 8.0000 mg | ORAL_TABLET | Freq: Once | ORAL | Status: DC

## 2016-10-05 MED ORDER — FENTANYL CITRATE (PF) 100 MCG/2ML IJ SOLN
50.0000 ug | Freq: Once | INTRAMUSCULAR | Status: AC
  Administered 2016-10-05: 50 ug via INTRAVENOUS
  Filled 2016-10-05: qty 2

## 2016-10-05 MED ORDER — ORPHENADRINE CITRATE 30 MG/ML IJ SOLN
60.0000 mg | Freq: Once | INTRAMUSCULAR | Status: AC
  Administered 2016-10-05: 60 mg via INTRAVENOUS
  Filled 2016-10-05: qty 2

## 2016-10-05 MED ORDER — KETOROLAC TROMETHAMINE 30 MG/ML IJ SOLN
30.0000 mg | Freq: Once | INTRAMUSCULAR | Status: AC
  Administered 2016-10-05: 30 mg via INTRAVENOUS
  Filled 2016-10-05: qty 1

## 2016-10-05 MED ORDER — METHOCARBAMOL 500 MG PO TABS
1000.0000 mg | ORAL_TABLET | Freq: Once | ORAL | Status: AC
  Administered 2016-10-05: 1000 mg via ORAL
  Filled 2016-10-05: qty 2

## 2016-10-05 MED ORDER — ONDANSETRON HCL 4 MG/2ML IJ SOLN
4.0000 mg | Freq: Once | INTRAMUSCULAR | Status: AC
  Administered 2016-10-05: 4 mg via INTRAVENOUS
  Filled 2016-10-05: qty 2

## 2016-10-05 MED ORDER — DEXAMETHASONE SODIUM PHOSPHATE 10 MG/ML IJ SOLN
10.0000 mg | Freq: Once | INTRAMUSCULAR | Status: AC
  Administered 2016-10-05: 10 mg via INTRAVENOUS

## 2016-10-05 MED ORDER — METHOCARBAMOL 500 MG PO TABS: 1000.0000 mg | ORAL_TABLET | Freq: Once | ORAL | Status: DC

## 2016-10-05 MED ORDER — DEXAMETHASONE SODIUM PHOSPHATE 10 MG/ML IJ SOLN
10.0000 mg | Freq: Once | INTRAMUSCULAR | Status: DC
  Filled 2016-10-05: qty 1

## 2016-10-05 NOTE — ED Triage Notes (Signed)
Pt in via POV with complaints of lower back pain.  Pt reports mechanical fall in back yard yesterday, "twisting my body."  Pt with previous back surgeries w/ artificial discs and fusions.  Pt with concerns that he has re-injured back.  Pt ambulatory to triage with cane.  NAD noted at this time.
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
## 2016-10-06 DIAGNOSIS — Z981 Arthrodesis status: Secondary | ICD-10-CM | POA: Diagnosis not present

## 2016-10-06 DIAGNOSIS — M961 Postlaminectomy syndrome, not elsewhere classified: Secondary | ICD-10-CM | POA: Diagnosis not present

## 2016-10-06 DIAGNOSIS — S22000A Wedge compression fracture of unspecified thoracic vertebra, initial encounter for closed fracture: Secondary | ICD-10-CM | POA: Diagnosis not present

## 2016-10-07 DIAGNOSIS — S22000A Wedge compression fracture of unspecified thoracic vertebra, initial encounter for closed fracture: Secondary | ICD-10-CM | POA: Diagnosis not present

## 2016-10-12 DIAGNOSIS — F39 Unspecified mood [affective] disorder: Secondary | ICD-10-CM | POA: Diagnosis not present

## 2016-10-12 DIAGNOSIS — F5105 Insomnia due to other mental disorder: Secondary | ICD-10-CM | POA: Diagnosis not present

## 2016-10-12 DIAGNOSIS — Z79899 Other long term (current) drug therapy: Secondary | ICD-10-CM | POA: Diagnosis not present

## 2016-10-13 DIAGNOSIS — G894 Chronic pain syndrome: Secondary | ICD-10-CM | POA: Diagnosis not present

## 2016-10-13 DIAGNOSIS — M5136 Other intervertebral disc degeneration, lumbar region: Secondary | ICD-10-CM | POA: Diagnosis not present

## 2016-10-13 DIAGNOSIS — F410 Panic disorder [episodic paroxysmal anxiety] without agoraphobia: Secondary | ICD-10-CM | POA: Diagnosis not present

## 2016-10-13 DIAGNOSIS — M25552 Pain in left hip: Secondary | ICD-10-CM | POA: Diagnosis not present

## 2016-10-13 DIAGNOSIS — E1142 Type 2 diabetes mellitus with diabetic polyneuropathy: Secondary | ICD-10-CM | POA: Diagnosis not present

## 2016-10-13 DIAGNOSIS — M531 Cervicobrachial syndrome: Secondary | ICD-10-CM | POA: Diagnosis not present

## 2016-10-13 DIAGNOSIS — G9050 Complex regional pain syndrome I, unspecified: Secondary | ICD-10-CM | POA: Diagnosis not present

## 2016-10-13 DIAGNOSIS — M4802 Spinal stenosis, cervical region: Secondary | ICD-10-CM | POA: Diagnosis not present

## 2016-10-13 DIAGNOSIS — G8929 Other chronic pain: Secondary | ICD-10-CM | POA: Diagnosis not present

## 2016-10-13 DIAGNOSIS — M545 Low back pain: Secondary | ICD-10-CM | POA: Diagnosis not present

## 2016-10-13 DIAGNOSIS — F419 Anxiety disorder, unspecified: Secondary | ICD-10-CM | POA: Diagnosis not present

## 2016-10-13 DIAGNOSIS — F4001 Agoraphobia with panic disorder: Secondary | ICD-10-CM | POA: Diagnosis not present

## 2016-10-17 ENCOUNTER — Encounter
Admission: RE | Admit: 2016-10-17 | Discharge: 2016-10-17 | Disposition: A | Discharge status: Home / Self Care | Payer: PPO | Source: Ambulatory Visit | Attending: Orthopedic Surgery | Admitting: Orthopedic Surgery

## 2016-10-17 DIAGNOSIS — F11959 Opioid use, unspecified with opioid-induced psychotic disorder, unspecified: Secondary | ICD-10-CM | POA: Diagnosis not present

## 2016-10-17 DIAGNOSIS — E119 Type 2 diabetes mellitus without complications: Secondary | ICD-10-CM | POA: Insufficient documentation

## 2016-10-17 DIAGNOSIS — Z01812 Encounter for preprocedural laboratory examination: Secondary | ICD-10-CM | POA: Diagnosis not present

## 2016-10-17 DIAGNOSIS — M79604 Pain in right leg: Secondary | ICD-10-CM | POA: Diagnosis not present

## 2016-10-17 DIAGNOSIS — F332 Major depressive disorder, recurrent severe without psychotic features: Secondary | ICD-10-CM | POA: Insufficient documentation

## 2016-10-17 DIAGNOSIS — F1420 Cocaine dependence, uncomplicated: Secondary | ICD-10-CM | POA: Insufficient documentation

## 2016-10-17 DIAGNOSIS — K219 Gastro-esophageal reflux disease without esophagitis: Secondary | ICD-10-CM | POA: Insufficient documentation

## 2016-10-17 DIAGNOSIS — M79605 Pain in left leg: Secondary | ICD-10-CM | POA: Insufficient documentation

## 2016-10-17 DIAGNOSIS — G894 Chronic pain syndrome: Secondary | ICD-10-CM | POA: Diagnosis not present

## 2016-10-17 HISTORY — DX: Other reaction to spinal and lumbar puncture: G97.1

## 2016-10-17 LAB — SURGICAL PCR SCREEN
MRSA, PCR: NEGATIVE
Staphylococcus aureus: NEGATIVE

## 2016-10-17 LAB — URINALYSIS, ROUTINE W REFLEX MICROSCOPIC
Bacteria, UA: NONE SEEN
Bilirubin Urine: NEGATIVE
Glucose, UA: >=500 mg/dL — AB
Hgb urine dipstick: NEGATIVE
Ketones, ur: NEGATIVE mg/dL
Leukocytes, UA: NEGATIVE
Nitrite: NEGATIVE
Protein, ur: NEGATIVE mg/dL
Specific Gravity, Urine: 1.029 (ref 1.005–1.030)
Squamous Epithelial / LPF: NONE SEEN
WBC, UA: NONE SEEN WBC/hpf (ref 0–5)
pH: 5 (ref 5.0–8.0)

## 2016-10-17 LAB — COMPREHENSIVE METABOLIC PANEL
ALT: 372 U/L — ABNORMAL HIGH (ref 17–63)
AST: 184 U/L — ABNORMAL HIGH (ref 15–41)
Albumin: 3.5 g/dL (ref 3.5–5.0)
Alkaline Phosphatase: 97 U/L (ref 38–126)
Anion gap: 9 (ref 5–15)
BUN: 25 mg/dL — ABNORMAL HIGH (ref 6–20)
CO2: 24 mmol/L (ref 22–32)
Calcium: 8.8 mg/dL — ABNORMAL LOW (ref 8.9–10.3)
Chloride: 102 mmol/L (ref 101–111)
Creatinine, Ser: 0.79 mg/dL (ref 0.61–1.24)
GFR calc Af Amer: >60 mL/min (ref >60)
GFR calc non Af Amer: >60 mL/min (ref >60)
Glucose, Bld: 365 mg/dL — ABNORMAL HIGH (ref 65–99)
Potassium: 4.1 mmol/L (ref 3.5–5.1)
Sodium: 135 mmol/L (ref 135–145)
Total Bilirubin: 0.7 mg/dL (ref 0.3–1.2)
Total Protein: 6.6 g/dL (ref 6.5–8.1)

## 2016-10-17 LAB — CBC
HCT: 41.1 % (ref 40.0–52.0)
Hemoglobin: 14.3 g/dL (ref 13.0–18.0)
MCH: 30.2 pg (ref 26.0–34.0)
MCHC: 34.7 g/dL (ref 32.0–36.0)
MCV: 86.9 fL (ref 80.0–100.0)
Platelets: 123 10*3/uL — ABNORMAL LOW (ref 150–440)
RBC: 4.73 MIL/uL (ref 4.40–5.90)
RDW: 15.1 % — ABNORMAL HIGH (ref 11.5–14.5)
WBC: 4.6 10*3/uL (ref 3.8–10.6)

## 2016-10-17 LAB — SEDIMENTATION RATE: Sed Rate: 11 mm/hr (ref 0–20)

## 2016-10-17 LAB — APTT: aPTT: 30 seconds (ref 24–36)

## 2016-10-17 LAB — HEMOGLOBIN A1C
Hgb A1c MFr Bld: 7.7 % — ABNORMAL HIGH (ref 4.8–5.6)
Mean Plasma Glucose: 174.29 mg/dL

## 2016-10-17 LAB — PROTIME-INR
INR: 0.95
Prothrombin Time: 12.6 seconds (ref 11.4–15.2)

## 2016-10-17 LAB — C-REACTIVE PROTEIN: CRP: 1.3 mg/dL — ABNORMAL HIGH (ref <1.0)

## 2016-10-17 NOTE — Pre-Procedure Instructions (Addendum)
Pt did not bring in his medication for review today.  He reports someone called him at home and reviewed his meds, so he did not think he needed to bring his med in today.  Informed pt that if he does not bring in his meds for review, Dr. Marry Guan will cancel his surgery. Pt's T+S can be drawn at this appt next week 10/25/16 at 1000 am when pt has agreed to bring in his meds for review.Marland Kitchen

## 2016-10-17 NOTE — Pre-Procedure Instructions (Signed)
Medical clearance faxed to Dr. Marry Guan regarding Blood Glucose= 365 at today's PAT visit.

## 2016-10-17 NOTE — Patient Instructions (Addendum)
Your procedure is scheduled on: Wednesday Oct. 24, 2018 Report to Same Day Surgery. To find out your arrival time please call [phone] between 1PM - 3PM on Tuesday Oct. 23, 2018.  Remember: Instructions that are not followed completely may result in serious medical risk, up to and including death, or upon the discretion of your surgeon and anesthesiologist your surgery may need to be rescheduled.     _X__ 1. Do not eat food after midnight the night before your procedure.                 No gum chewing or hard candies. You may drink clear liquids up to 2 hours                 before you are scheduled to arrive for your surgery- DO not drink clear                 liquids within 2 hours of the start of your surgery.                 Clear Liquids include:  water.     _X__ 2.  No Alcohol for 24 hours before or after surgery.   ___ 3.  Do Not Smoke or use e-cigarettes For 24 Hours Prior to Your Surgery.                 Do not use any chewable tobacco products for at least 6 hours prior to                 surgery.  ____  4.  Bring all medications with you on the day of surgery if instructed.   _x___  5.  Notify your doctor if there is any change in your medical condition      (cold, fever, infections).     Do not wear jewelry, make-up, hairpins, clips or nail polish. Do not wear lotions, powders, or perfumes. You may wear deodorant. Do not shave 48 hours prior to surgery. Men may shave face and neck. Do not bring valuables to the hospital.    [facility] is not responsible for any belongings or valuables.  Contacts, dentures or bridgework may not be worn into surgery. Leave your suitcase in the car. After surgery it may be brought to your room. For patients admitted to the hospital, discharge time is determined by your treatment team.   Patients discharged the day of surgery will not be allowed to drive home.   Please read over the following fact sheets that  you were given:   Preparing for surgery     ____ Take these medicines the morning of surgery with A SIP OF WATER:    1. lansoprazole (PREVACID) take also at bedtime the night prior to surgery  2. gabapentin (NEURONTIN)  3.   4.  5.  6.  ____ Fleet Enema (as directed)   _x___ Use CHG Soap as directed  ____ Use inhalers on the day of surgery  _x___ Stop metformin 2 days prior to surgery    ____ Take 1/2 of usual insulin dose the night before surgery. No insulin the morning          of surgery.   ____ Stop Coumadin/Plavix/aspirin on does not apply.  ____ Stop Anti-inflammatories: naproxen (NAPROSYN)   on Oct. 17, 2018.   ____ Stop supplements until after surgery.    ____ Bring C-Pap to the hospital.   You must make an appointment  to review your meds, must bring in your meds or Dr. Marry Guan will cancel your surgery.  A type and Screen will also be drawn at this appoinment.

## 2016-10-18 DIAGNOSIS — T84038D Mechanical loosening of other internal prosthetic joint, subsequent encounter: Secondary | ICD-10-CM | POA: Diagnosis not present

## 2016-10-18 DIAGNOSIS — Z96652 Presence of left artificial knee joint: Secondary | ICD-10-CM | POA: Diagnosis not present

## 2016-10-18 DIAGNOSIS — Z96659 Presence of unspecified artificial knee joint: Secondary | ICD-10-CM | POA: Diagnosis not present

## 2016-10-18 LAB — HEPATITIS PANEL, ACUTE
HCV Ab: >11 s/co ratio — ABNORMAL HIGH (ref 0.0–0.9)
Hep A IgM: NEGATIVE
Hep B C IgM: NEGATIVE
Hepatitis B Surface Ag: NEGATIVE

## 2016-10-18 LAB — HIV ANTIBODY (ROUTINE TESTING W REFLEX): HIV Screen 4th Generation wRfx: NONREACTIVE

## 2016-10-18 LAB — URINE CULTURE
Culture: NO GROWTH
Special Requests: NORMAL

## 2016-10-19 NOTE — Pre-Procedure Instructions (Signed)
Hep C results sent to Dr. Marry Guan for review.

## 2016-10-25 ENCOUNTER — Encounter
Admission: RE | Admit: 2016-10-25 | Discharge: 2016-10-25 | Disposition: A | Discharge status: Home / Self Care | Payer: PPO | Source: Ambulatory Visit | Attending: Orthopedic Surgery | Admitting: Orthopedic Surgery

## 2016-10-25 DIAGNOSIS — Z01818 Encounter for other preprocedural examination: Secondary | ICD-10-CM | POA: Diagnosis not present

## 2016-10-25 DIAGNOSIS — T84127D Displacement of internal fixation device of bone of left lower leg, subsequent encounter: Secondary | ICD-10-CM | POA: Insufficient documentation

## 2016-10-25 DIAGNOSIS — Y838 Other surgical procedures as the cause of abnormal reaction of the patient, or of later complication, without mention of misadventure at the time of the procedure: Secondary | ICD-10-CM | POA: Insufficient documentation

## 2016-10-25 LAB — TYPE AND SCREEN
ABO/RH(D): A POS
Antibody Screen: NEGATIVE

## 2016-10-25 NOTE — Pre-Procedure Instructions (Signed)
Pt brought his medication bottles for review.  Informed pt his BG level was 365 at PAT appt 10/17/16, Dr. Marry Guan office informed of anesthesia's request for BG optimization, to expect a call from Dr. Clydell Hakim office with appt to see his PCP.

## 2016-11-02 ENCOUNTER — Encounter: Admission: RE | Payer: Self-pay | Source: Ambulatory Visit

## 2016-11-02 ENCOUNTER — Inpatient Hospital Stay: Admission: RE | Admit: 2016-11-02 | Payer: PPO | Source: Ambulatory Visit | Admitting: Orthopedic Surgery

## 2016-11-02 SURGERY — TOTAL KNEE REVISION
Anesthesia: Choice | Laterality: Left

## 2016-11-10 DIAGNOSIS — M4326 Fusion of spine, lumbar region: Secondary | ICD-10-CM | POA: Diagnosis not present

## 2016-11-10 DIAGNOSIS — S22080A Wedge compression fracture of T11-T12 vertebra, initial encounter for closed fracture: Secondary | ICD-10-CM | POA: Diagnosis not present

## 2016-11-10 DIAGNOSIS — S22000D Wedge compression fracture of unspecified thoracic vertebra, subsequent encounter for fracture with routine healing: Secondary | ICD-10-CM | POA: Diagnosis not present

## 2016-12-04 ENCOUNTER — Other Ambulatory Visit: Payer: Self-pay

## 2016-12-04 ENCOUNTER — Emergency Department
Admission: EM | Admit: 2016-12-04 | Discharge: 2016-12-04 | Disposition: A | Discharge status: Home / Self Care | Payer: PPO | Attending: Emergency Medicine | Admitting: Emergency Medicine

## 2016-12-04 ENCOUNTER — Encounter: Payer: Self-pay | Admitting: Emergency Medicine

## 2016-12-04 ENCOUNTER — Emergency Department: Payer: PPO

## 2016-12-04 DIAGNOSIS — M79671 Pain in right foot: Secondary | ICD-10-CM

## 2016-12-04 DIAGNOSIS — Y939 Activity, unspecified: Secondary | ICD-10-CM | POA: Diagnosis not present

## 2016-12-04 DIAGNOSIS — L97519 Non-pressure chronic ulcer of other part of right foot with unspecified severity: Secondary | ICD-10-CM | POA: Diagnosis not present

## 2016-12-04 DIAGNOSIS — Z7984 Long term (current) use of oral hypoglycemic drugs: Secondary | ICD-10-CM | POA: Insufficient documentation

## 2016-12-04 DIAGNOSIS — Z96642 Presence of left artificial hip joint: Secondary | ICD-10-CM | POA: Diagnosis not present

## 2016-12-04 DIAGNOSIS — Y929 Unspecified place or not applicable: Secondary | ICD-10-CM | POA: Insufficient documentation

## 2016-12-04 DIAGNOSIS — Z794 Long term (current) use of insulin: Secondary | ICD-10-CM | POA: Diagnosis not present

## 2016-12-04 DIAGNOSIS — X58XXXA Exposure to other specified factors, initial encounter: Secondary | ICD-10-CM | POA: Diagnosis not present

## 2016-12-04 DIAGNOSIS — Z96652 Presence of left artificial knee joint: Secondary | ICD-10-CM | POA: Insufficient documentation

## 2016-12-04 DIAGNOSIS — Z79899 Other long term (current) drug therapy: Secondary | ICD-10-CM | POA: Insufficient documentation

## 2016-12-04 DIAGNOSIS — E11621 Type 2 diabetes mellitus with foot ulcer: Secondary | ICD-10-CM | POA: Insufficient documentation

## 2016-12-04 DIAGNOSIS — Y999 Unspecified external cause status: Secondary | ICD-10-CM | POA: Insufficient documentation

## 2016-12-04 DIAGNOSIS — S90121A Contusion of right lesser toe(s) without damage to nail, initial encounter: Secondary | ICD-10-CM

## 2016-12-04 LAB — CBC WITH DIFFERENTIAL/PLATELET
Basophils Absolute: 0 10*3/uL (ref 0–0.1)
Basophils Relative: 0 %
Eosinophils Absolute: 0.4 10*3/uL (ref 0–0.7)
Eosinophils Relative: 5 %
HCT: 39.6 % — ABNORMAL LOW (ref 40.0–52.0)
Hemoglobin: 13.8 g/dL (ref 13.0–18.0)
Lymphocytes Relative: 26 %
Lymphs Abs: 1.7 10*3/uL (ref 1.0–3.6)
MCH: 31.1 pg (ref 26.0–34.0)
MCHC: 34.8 g/dL (ref 32.0–36.0)
MCV: 89.4 fL (ref 80.0–100.0)
Monocytes Absolute: 0.5 10*3/uL (ref 0.2–1.0)
Monocytes Relative: 8 %
Neutro Abs: 4 10*3/uL (ref 1.4–6.5)
Neutrophils Relative %: 61 %
Platelets: 212 10*3/uL (ref 150–440)
RBC: 4.42 MIL/uL (ref 4.40–5.90)
RDW: 13.6 % (ref 11.5–14.5)
WBC: 6.6 10*3/uL (ref 3.8–10.6)

## 2016-12-04 LAB — BASIC METABOLIC PANEL
Anion gap: 12 (ref 5–15)
BUN: 25 mg/dL — ABNORMAL HIGH (ref 6–20)
CO2: 21 mmol/L — ABNORMAL LOW (ref 22–32)
Calcium: 8.9 mg/dL (ref 8.9–10.3)
Chloride: 105 mmol/L (ref 101–111)
Creatinine, Ser: 0.92 mg/dL (ref 0.61–1.24)
GFR calc Af Amer: >60 mL/min (ref >60)
GFR calc non Af Amer: >60 mL/min (ref >60)
Glucose, Bld: 136 mg/dL — ABNORMAL HIGH (ref 65–99)
Potassium: 3.8 mmol/L (ref 3.5–5.1)
Sodium: 138 mmol/L (ref 135–145)

## 2016-12-04 MED ORDER — CIPROFLOXACIN HCL 500 MG PO TABS
500.0000 mg | ORAL_TABLET | Freq: Once | ORAL | Status: AC
  Administered 2016-12-04: 500 mg via ORAL
  Filled 2016-12-04: qty 1

## 2016-12-04 MED ORDER — CIPROFLOXACIN HCL 500 MG PO TABS: 500.0000 mg | ORAL_TABLET | Freq: Two times a day (BID) | ORAL | 0 refills | Status: DC

## 2016-12-04 MED ORDER — KETOROLAC TROMETHAMINE 30 MG/ML IJ SOLN
60.0000 mg | Freq: Once | INTRAMUSCULAR | Status: AC
  Administered 2016-12-04: 60 mg via INTRAMUSCULAR
  Filled 2016-12-04: qty 2

## 2016-12-04 NOTE — ED Triage Notes (Signed)
Pt reports that in the last 24 hours his fourth toe on his right foot went from red to purple. Pt reports that he has diabetic neuropathy and is concerned about losing his toes. Pt is ambulatory at this time and does have visible swelling to toe and discoloration. Pt is in NAD at this time.
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
## 2016-12-04 NOTE — Discharge Instructions (Signed)
1. Take antibiotic as prescribed for possible diabetic ulcer of your toe (Cipro 500mg  twice daily x 7 days). 2. Return to the ER for worsening symptoms, persistent vomiting, difficulty breathing or other concerns.

## 2016-12-09 DIAGNOSIS — Z8619 Personal history of other infectious and parasitic diseases: Secondary | ICD-10-CM | POA: Diagnosis not present

## 2016-12-09 DIAGNOSIS — F331 Major depressive disorder, recurrent, moderate: Secondary | ICD-10-CM | POA: Diagnosis not present

## 2016-12-09 DIAGNOSIS — M25562 Pain in left knee: Secondary | ICD-10-CM | POA: Diagnosis not present

## 2016-12-09 DIAGNOSIS — G8929 Other chronic pain: Secondary | ICD-10-CM | POA: Diagnosis not present

## 2016-12-09 DIAGNOSIS — F411 Generalized anxiety disorder: Secondary | ICD-10-CM | POA: Diagnosis not present

## 2016-12-09 DIAGNOSIS — E119 Type 2 diabetes mellitus without complications: Secondary | ICD-10-CM | POA: Diagnosis not present

## 2017-02-17 DIAGNOSIS — E1142 Type 2 diabetes mellitus with diabetic polyneuropathy: Secondary | ICD-10-CM | POA: Diagnosis not present

## 2017-02-17 DIAGNOSIS — G629 Polyneuropathy, unspecified: Secondary | ICD-10-CM | POA: Diagnosis not present

## 2017-03-12 ENCOUNTER — Other Ambulatory Visit: Payer: Self-pay

## 2017-03-12 ENCOUNTER — Emergency Department
Admission: EM | Admit: 2017-03-12 | Discharge: 2017-03-12 | Disposition: A | Discharge status: Home / Self Care | Payer: PPO | Attending: Emergency Medicine | Admitting: Emergency Medicine

## 2017-03-12 ENCOUNTER — Emergency Department: Payer: PPO

## 2017-03-12 DIAGNOSIS — R092 Respiratory arrest: Secondary | ICD-10-CM | POA: Diagnosis not present

## 2017-03-12 DIAGNOSIS — Z96652 Presence of left artificial knee joint: Secondary | ICD-10-CM | POA: Diagnosis not present

## 2017-03-12 DIAGNOSIS — R000 Tachycardia, unspecified: Secondary | ICD-10-CM | POA: Diagnosis not present

## 2017-03-12 DIAGNOSIS — R4020 Unspecified coma: Secondary | ICD-10-CM | POA: Diagnosis not present

## 2017-03-12 DIAGNOSIS — Z96642 Presence of left artificial hip joint: Secondary | ICD-10-CM | POA: Insufficient documentation

## 2017-03-12 DIAGNOSIS — Z7984 Long term (current) use of oral hypoglycemic drugs: Secondary | ICD-10-CM | POA: Diagnosis not present

## 2017-03-12 DIAGNOSIS — E119 Type 2 diabetes mellitus without complications: Secondary | ICD-10-CM | POA: Insufficient documentation

## 2017-03-12 DIAGNOSIS — T401X1A Poisoning by heroin, accidental (unintentional), initial encounter: Secondary | ICD-10-CM | POA: Diagnosis not present

## 2017-03-12 DIAGNOSIS — Z79899 Other long term (current) drug therapy: Secondary | ICD-10-CM | POA: Diagnosis not present

## 2017-03-12 DIAGNOSIS — J9811 Atelectasis: Secondary | ICD-10-CM | POA: Diagnosis not present

## 2017-03-12 MED ORDER — NALOXONE HCL 4 MG/0.1ML NA LIQD
1.0000 | Freq: Once | NASAL | Status: AC
  Administered 2017-03-12: 1 via NASAL
  Filled 2017-03-12: qty 4

## 2017-03-12 NOTE — Discharge Instructions (Signed)
Please follow-up with your primary care physician this week for reevaluation return to the emergency department for any concerns.  It was a pleasure to take care of you today, and thank you for coming to our emergency department.  If you have any questions or concerns before leaving please ask the nurse to grab me and I'm more than happy to go through your aftercare instructions again.  If you were prescribed any opioid pain medication today such as Norco, Vicodin, Percocet, morphine, hydrocodone, or oxycodone please make sure you do not drive when you are taking this medication as it can alter your ability to drive safely.  If you have any concerns once you are home that you are not improving or are in fact getting worse before you can make it to your follow-up appointment, please do not hesitate to call 911 and come back for further evaluation.  Darel Hong, MD  Results for orders placed or performed during the hospital encounter of 12/04/16  CBC with Differential  Result Value Ref Range   WBC 6.6 3.8 - 10.6 K/uL   RBC 4.42 4.40 - 5.90 MIL/uL   Hemoglobin 13.8 13.0 - 18.0 g/dL   HCT 39.6 (L) 40.0 - 52.0 %   MCV 89.4 80.0 - 100.0 fL   MCH 31.1 26.0 - 34.0 pg   MCHC 34.8 32.0 - 36.0 g/dL   RDW 13.6 11.5 - 14.5 %   Platelets 212 150 - 440 K/uL   Neutrophils Relative % 61 %   Neutro Abs 4.0 1.4 - 6.5 K/uL   Lymphocytes Relative 26 %   Lymphs Abs 1.7 1.0 - 3.6 K/uL   Monocytes Relative 8 %   Monocytes Absolute 0.5 0.2 - 1.0 K/uL   Eosinophils Relative 5 %   Eosinophils Absolute 0.4 0 - 0.7 K/uL   Basophils Relative 0 %   Basophils Absolute 0.0 0 - 0.1 K/uL  Basic metabolic panel  Result Value Ref Range   Sodium 138 135 - 145 mmol/L   Potassium 3.8 3.5 - 5.1 mmol/L   Chloride 105 101 - 111 mmol/L   CO2 21 (L) 22 - 32 mmol/L   Glucose, Bld 136 (H) 65 - 99 mg/dL   BUN 25 (H) 6 - 20 mg/dL   Creatinine, Ser 0.92 0.61 - 1.24 mg/dL   Calcium 8.9 8.9 - 10.3 mg/dL   GFR calc non Af  Amer >60 >60 mL/min   GFR calc Af Amer >60 >60 mL/min   Anion gap 12 5 - 15   Dg Chest Port 1 View  Result Date: 03/12/2017 CLINICAL DATA:  Drug overdose.  Patient found unconscious. EXAM: PORTABLE CHEST 1 VIEW COMPARISON:  Single-view of the chest 05/22/2013. FINDINGS: Minimal linear atelectasis left lung base noted. Lungs otherwise clear. Heart size normal. No pneumothorax or pleural effusion. No acute bony abnormality. IMPRESSION: No acute disease. Electronically Signed   By: Inge Rise M.D.   On: 03/12/2017 02:28

## 2017-03-12 NOTE — ED Notes (Signed)
Pt's son Omkar here to take pt home

## 2017-03-12 NOTE — ED Triage Notes (Signed)
Pt arrived via EMS from home d/t heroin overdose. EMS reports pt was found unconscious by roommate. EMS reports giving 2mg  of narcan upon arrival d/t pt having a pulse but not breathing. Pt began breathing and more awake within minutes. Pt reports chronic back for over 10 years and being recently taken off his prescribed pain medication. Pt is A&O x4 at this time with stable VS.

## 2017-03-12 NOTE — ED Notes (Signed)
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
## 2017-05-16 DIAGNOSIS — F5105 Insomnia due to other mental disorder: Secondary | ICD-10-CM | POA: Diagnosis not present

## 2017-05-16 DIAGNOSIS — E1142 Type 2 diabetes mellitus with diabetic polyneuropathy: Secondary | ICD-10-CM | POA: Diagnosis not present

## 2017-05-16 DIAGNOSIS — G8929 Other chronic pain: Secondary | ICD-10-CM | POA: Diagnosis not present

## 2017-05-16 DIAGNOSIS — Z8619 Personal history of other infectious and parasitic diseases: Secondary | ICD-10-CM | POA: Diagnosis not present

## 2017-05-16 DIAGNOSIS — F419 Anxiety disorder, unspecified: Secondary | ICD-10-CM | POA: Diagnosis not present

## 2017-05-30 DIAGNOSIS — D2371 Other benign neoplasm of skin of right lower limb, including hip: Secondary | ICD-10-CM | POA: Diagnosis not present

## 2017-05-30 DIAGNOSIS — E1142 Type 2 diabetes mellitus with diabetic polyneuropathy: Secondary | ICD-10-CM | POA: Diagnosis not present

## 2017-05-30 DIAGNOSIS — B351 Tinea unguium: Secondary | ICD-10-CM | POA: Diagnosis not present

## 2017-05-31 ENCOUNTER — Emergency Department: Payer: PPO

## 2017-05-31 ENCOUNTER — Encounter: Payer: Self-pay | Admitting: Emergency Medicine

## 2017-05-31 ENCOUNTER — Emergency Department
Admission: EM | Admit: 2017-05-31 | Discharge: 2017-05-31 | Disposition: A | Discharge status: Home / Self Care | Payer: PPO | Attending: Student in an Organized Health Care Education/Training Program | Admitting: Student in an Organized Health Care Education/Training Program

## 2017-05-31 DIAGNOSIS — S46912A Strain of unspecified muscle, fascia and tendon at shoulder and upper arm level, left arm, initial encounter: Secondary | ICD-10-CM | POA: Diagnosis not present

## 2017-05-31 DIAGNOSIS — Z7984 Long term (current) use of oral hypoglycemic drugs: Secondary | ICD-10-CM | POA: Insufficient documentation

## 2017-05-31 DIAGNOSIS — E1140 Type 2 diabetes mellitus with diabetic neuropathy, unspecified: Secondary | ICD-10-CM | POA: Diagnosis not present

## 2017-05-31 DIAGNOSIS — G8929 Other chronic pain: Secondary | ICD-10-CM | POA: Diagnosis not present

## 2017-05-31 DIAGNOSIS — M545 Low back pain, unspecified: Secondary | ICD-10-CM

## 2017-05-31 DIAGNOSIS — Z1389 Encounter for screening for other disorder: Secondary | ICD-10-CM | POA: Diagnosis not present

## 2017-05-31 DIAGNOSIS — Y9301 Activity, walking, marching and hiking: Secondary | ICD-10-CM | POA: Diagnosis not present

## 2017-05-31 DIAGNOSIS — E559 Vitamin D deficiency, unspecified: Secondary | ICD-10-CM | POA: Diagnosis not present

## 2017-05-31 DIAGNOSIS — E119 Type 2 diabetes mellitus without complications: Secondary | ICD-10-CM | POA: Diagnosis not present

## 2017-05-31 DIAGNOSIS — W010XXA Fall on same level from slipping, tripping and stumbling without subsequent striking against object, initial encounter: Secondary | ICD-10-CM | POA: Insufficient documentation

## 2017-05-31 DIAGNOSIS — Z79899 Other long term (current) drug therapy: Secondary | ICD-10-CM | POA: Insufficient documentation

## 2017-05-31 DIAGNOSIS — Y999 Unspecified external cause status: Secondary | ICD-10-CM | POA: Diagnosis not present

## 2017-05-31 DIAGNOSIS — M25512 Pain in left shoulder: Secondary | ICD-10-CM | POA: Diagnosis not present

## 2017-05-31 DIAGNOSIS — E7800 Pure hypercholesterolemia, unspecified: Secondary | ICD-10-CM | POA: Diagnosis not present

## 2017-05-31 DIAGNOSIS — M879 Osteonecrosis, unspecified: Secondary | ICD-10-CM | POA: Diagnosis not present

## 2017-05-31 DIAGNOSIS — Y929 Unspecified place or not applicable: Secondary | ICD-10-CM | POA: Insufficient documentation

## 2017-05-31 DIAGNOSIS — S4992XA Unspecified injury of left shoulder and upper arm, initial encounter: Secondary | ICD-10-CM | POA: Diagnosis not present

## 2017-05-31 DIAGNOSIS — G894 Chronic pain syndrome: Secondary | ICD-10-CM | POA: Diagnosis not present

## 2017-05-31 MED ORDER — CYCLOBENZAPRINE HCL 10 MG PO TABS: 10.0000 mg | ORAL_TABLET | Freq: Three times a day (TID) | ORAL | 0 refills | Status: DC | PRN

## 2017-05-31 MED ORDER — TRAMADOL HCL 50 MG PO TABS: 50.0000 mg | ORAL_TABLET | Freq: Four times a day (QID) | ORAL | 0 refills | Status: DC | PRN
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
## 2017-05-31 NOTE — ED Triage Notes (Signed)
FIRST NURSE NOTE-pt fell and has multiple complaints. Multiple previous ortho surgeries.

## 2017-05-31 NOTE — ED Notes (Signed)
Patient states ride is waiting in the parking lot.

## 2017-05-31 NOTE — ED Triage Notes (Signed)
PT arrived after mechanical fall that caused injury to left shoulder. Pt also states he has chronic back pain but since fall it has been "drastically worse."

## 2017-06-03 DIAGNOSIS — E119 Type 2 diabetes mellitus without complications: Secondary | ICD-10-CM | POA: Diagnosis not present

## 2017-06-03 DIAGNOSIS — E7800 Pure hypercholesterolemia, unspecified: Secondary | ICD-10-CM | POA: Diagnosis not present

## 2017-06-03 DIAGNOSIS — E559 Vitamin D deficiency, unspecified: Secondary | ICD-10-CM | POA: Diagnosis not present

## 2017-06-07 ENCOUNTER — Ambulatory Visit
Admit: 2017-06-07 | Discharge: 2017-06-08 | Payer: PRIVATE HEALTH INSURANCE | Attending: Gastroenterology | Primary: Gastroenterology

## 2017-06-07 DIAGNOSIS — K7589 Other specified inflammatory liver diseases: Secondary | ICD-10-CM

## 2017-06-07 DIAGNOSIS — R768 Other specified abnormal immunological findings in serum: Principal | ICD-10-CM

## 2017-06-07 DIAGNOSIS — Z1159 Encounter for screening for other viral diseases: Secondary | ICD-10-CM

## 2017-06-07 DIAGNOSIS — K769 Liver disease, unspecified: Secondary | ICD-10-CM

## 2017-06-07 DIAGNOSIS — Z79899 Other long term (current) drug therapy: Secondary | ICD-10-CM | POA: Diagnosis not present

## 2017-06-07 DIAGNOSIS — B1920 Unspecified viral hepatitis C without hepatic coma: Secondary | ICD-10-CM | POA: Diagnosis not present

## 2017-06-07 LAB — COMPREHENSIVE METABOLIC PANEL
ALBUMIN: 4.4 g/dL (ref 3.5–5.0)
ALKALINE PHOSPHATASE: 169 U/L — ABNORMAL HIGH (ref 38–126)
ANION GAP: 10 mmol/L (ref 9–15)
BILIRUBIN TOTAL: 0.7 mg/dL (ref 0.0–1.2)
BLOOD UREA NITROGEN: 24 mg/dL — ABNORMAL HIGH (ref 7–21)
BUN / CREAT RATIO: 29
CALCIUM: 9.9 mg/dL (ref 8.5–10.2)
CHLORIDE: 101 mmol/L (ref 98–107)
CO2: 30 mmol/L (ref 22.0–30.0)
CREATININE: 0.84 mg/dL (ref 0.70–1.30)
EGFR MDRD AF AMER: >=60 mL/min/{1.73_m2} (ref >=60)
EGFR MDRD NON AF AMER: >=60 mL/min/{1.73_m2} (ref >=60)
GLUCOSE RANDOM: 111 mg/dL (ref 65–179)
POTASSIUM: 4.6 mmol/L (ref 3.5–5.0)
PROTEIN TOTAL: 8.6 g/dL — ABNORMAL HIGH (ref 6.5–8.3)
SODIUM: 141 mmol/L (ref 135–145)

## 2017-06-07 LAB — CBC
HEMATOCRIT: 44.3 % (ref 41.0–53.0)
HEMOGLOBIN: 14.5 g/dL (ref 13.5–17.5)
MEAN CORPUSCULAR HEMOGLOBIN: 31 pg (ref 26.0–34.0)
MEAN CORPUSCULAR VOLUME: 94.8 fL (ref 80.0–100.0)
MEAN PLATELET VOLUME: 8.8 fL (ref 7.0–10.0)
PLATELET COUNT: 193 10*9/L (ref 150–440)
RED CELL DISTRIBUTION WIDTH: 14.8 % (ref 12.0–15.0)
WBC ADJUSTED: 5.1 10*9/L (ref 4.5–11.0)

## 2017-06-07 LAB — IRON & TIBC
IRON SATURATION (CALC): 41 % (ref 20–50)
TOTAL IRON BINDING CAPACITY (CALC): 487 mg/dL — ABNORMAL HIGH (ref 252.0–479.0)
TRANSFERRIN: 386.5 mg/dL — ABNORMAL HIGH (ref 200.0–380.0)

## 2017-06-07 LAB — MEAN CORPUSCULAR HEMOGLOBIN CONC: Lab: 32.7

## 2017-06-07 LAB — FERRITIN: Ferritin:MCnc:Pt:Ser/Plas:Qn:: 278

## 2017-06-07 LAB — IRON SATURATION (CALC): Iron saturation:MFr:Pt:Ser/Plas:Qn:: 41

## 2017-06-07 LAB — ALBUMIN: Albumin:MCnc:Pt:Ser/Plas:Qn:: 4.4

## 2017-06-07 NOTE — Unmapped (Signed)
Chronic hepatitis C. You will be a good candidate for treatment with medications that have high rate of cure and low risk of side effects. Labs and FIBROSCAN today. Liver ultrasound scheduled on a different day. You will meet our pharmacist Erskine Squibb to review treatment options. Return to office in 3 months or sooner to see Owens Shark, DNP once hepatitis C medications have been started.

## 2017-06-07 NOTE — Unmapped (Signed)
Counseling for HCV treatment     B18.2 Hep C: yes    K74.60 Cirrhosis: pending Fibrosure on 06/07/17,   Child Pugh Score if applicable and for Medicaid pts: n/a  Z94.4 Liver Transplant: no    Genotype: 1a  (Ref Att 10/07/16 pg 8)  HCV RNA:  915,000 IU/ml on 09/13/16  (Ref Att 10/07/16 pg 5)   Fibrosis score: pending Fibrosure on 06/07/17  HIV Co-infection? no  Signs of liver decompensation? no  Previous treatment? naive    Planned regimen: Harvoni (ledipasvir/sofosbuvir 90/400mg ) x 8-12 weeks  Urgency: Routine Request    Prescribing Provider/NPI: Dr. Gavin Potters / [id number]  Please see Media tab under Chart Review on date 06/07/17 for patient signature/waiver forms.  Insurance: Medicare part D, Manufacturing engineer.     Darryl Koch is 62 y.o. Caucasian male who presents to clinic alone and is interested in starting treatment with Harvoni. We discussed the prior authorization (PA) process of obtaining the medication through insurance and that this may take some time.  Reports his Surgeon is requesting his HCV treated before surgery for his knee replacement. Unable to obtain Fibroscan in clinic today due to device implant.     Current medications:  Current Outpatient Medications   Medication Sig Dispense Refill   ??? blood sugar diagnostic (PRECISION Q-I-D TEST) Strp Use once daily Use as instructed.     ??? blood-glucose meter kit Use as directed     ??? ciclopirox (PENLAC) 8 % solution Apply topically.     ??? cyclobenzaprine (FLEXERIL) 10 MG tablet Take 10 mg by mouth.     ??? gabapentin (NEURONTIN) 600 MG tablet TAKE ONE TABLET BY MOUTH 3 TO 4 TIMES A DAY AS DIRECTED  3   ??? glimepiride (AMARYL) 4 MG tablet Take 4 mg by mouth.     ??? hydrOXYzine (ATARAX) 25 MG tablet TAKE 1 TABLET BY MOUTH NIGHTLY AS NEEDED FOR ITCHING OR ANXIETY  0   ??? lancets Misc 1 each.     ??? lidocaine (LIDODERM) 5 % patch Place on the skin.     ??? metFORMIN (GLUCOPHAGE) 1000 MG tablet Take 1,000 mg by mouth.     ??? multivitamin-minerals-lutein (MULTIVITAMIN 50 PLUS) Tab Take 1 tablet by mouth.     ??? naproxen (NAPROSYN) 500 MG tablet      ??? ONETOUCH ULTRA BLUE TEST STRIP Strp TEST BLOOD SUGAR TWICE A DAY AS DIRECTED (DX E11.9)  12   ??? ONETOUCH ULTRA2 kit Use as directed.  0   ??? pioglitazone (ACTOS) 30 MG tablet Take 30 mg by mouth.     ??? pregabalin (LYRICA) 200 MG capsule Take 200 mg by mouth.     ??? testosterone cypionate (DEPOTESTOTERONE CYPIONATE) 200 mg/mL injection        No current facility-administered medications for this visit.    OTC: ranitidine prn, Vit D 3    Following topics were discussed during counseling:    1. Indications for medication, dosage and administration.     A. Harvoni 90/400mg  1 tablet to take daily with or without food.    2. Common side effects of medications and management strategies. (fatigue, headache)    3. Importance of adherence to regimen, follow-up clinic visits and lab monitoring.     A. Asked patient to call [facility] [phone]  to establish start date for treatment and to schedule appointment 4 weeks before starting treatment.    4. Drug-drug interaction.    A. Current medications have  been reviewed and assessed for possible interaction.  We discussed the mechanism of drug-drug interaction with acid lowering agents. Advised to check with MD or pharmacist before taking any OTC/herbal medications, with emphasis regarding indigestion/heartburn medications.  Denies use of herbal medication such as milk thistle or [facility] wart.  Allergies have been verified. Endorses occasional alcohol. Recommended abstinence. Patient in agreement.    5. Importance of informing pharmacy and clinic of updated contact information.   Discussed the process of obtaining medication through specialty pharmacy and when approved medication will be delivered to patient's home. Stressed importance of being able to be reached over the phone.    Patient verbalized understanding. Provided contact information for any questions/concerns.       Park Breed, Pharm D., BCPS, BCGP, CPP  [facility]  [address]  [place], [place] [address]  [phone]    This portion of the visit was 15 minutes in duration and greater than 50% was spend in direct counseling and coordination of care regarding hepatitis C medication management.

## 2017-06-07 NOTE — Unmapped (Signed)
Patient present for Fibroscan appointment today. Discussed restrictions to Fibroscan, such as implantable devices or ascites, with patient. Patient stated he currently has an Interstim bladder stimulator. Informed patient that he unfortunately does not meet criteria to have Fibroscan due to contraindication with implantable device. Patient verbalized understanding and agreement. Erskine Squibb, pharmacist, aware and ordered Fibrosure test. No further questions/concerns from patient at this time. Patient discharged without incident to self.

## 2017-06-08 DIAGNOSIS — E559 Vitamin D deficiency, unspecified: Secondary | ICD-10-CM | POA: Diagnosis not present

## 2017-06-08 DIAGNOSIS — E119 Type 2 diabetes mellitus without complications: Secondary | ICD-10-CM | POA: Diagnosis not present

## 2017-06-08 DIAGNOSIS — E7800 Pure hypercholesterolemia, unspecified: Secondary | ICD-10-CM | POA: Diagnosis not present

## 2017-06-09 LAB — HEPATITIS B SURFACE ANTIBODY: Hepatitis B virus surface Ab:PrThr:Pt:Ser:Ord:: REACTIVE — AB

## 2017-06-09 LAB — HEPATITIS A IGG: Hepatitis A virus Ab.IgG:PrThr:Pt:Ser:Ord:: REACTIVE — AB

## 2017-06-09 LAB — HEPATITIS B CORE TOTAL ANTIBODY: Hepatitis B virus core Ab:PrThr:Pt:Ser/Plas:Ord:IA: REACTIVE — AB

## 2017-06-09 LAB — HEPATITIS B SURFACE ANTIGEN: Hepatitis B virus surface Ag:PrThr:Pt:Ser:Ord:: NONREACTIVE

## 2017-06-09 NOTE — Unmapped (Signed)
[facility]    Alba Destine, M.D.  Professor of Medicine  Director, [facility]  [place] [facility] [place] at [place]    [phone]    Sherren Mocha, MD  [address]  [facility] [place] ??? FAMILY AND INTERNAL MEDICINE  [place], [place] [address]     Chief complaint: Patient is referred for chronic hepatitis C genotype 1a.    Present illness: Patient is a 62 y.o. Caucasian male with chronic hepatitis C type 1a. He had acute icteric hepatitis in 1983 while in the [employer], attributed to inoculations. Currently feels well except fatigue. Denies N/V, abd pain, SOB, chest pain, skin rash.    ROS: 10 sys ROS negative except as noted above.    Past medical history:    1. Orthopedic surgery on knees and hips  2. + Diabetes  3. No hx of HTN, CAD, lung disease  4. Anxiety disorder/hx of depression  5. Immune to HAV and HBV  6. FIBROSCAN 05/2017: Not performed due to bladder stimulator in place    Allergies   Allergen Reactions   ??? Adhesive Tape-Silicones Other (See Comments)     [place]  *Paper Tape is ok    ??? Morphine Rash     Current Outpatient Medications   Medication Sig Dispense Refill   ??? blood sugar diagnostic (PRECISION Q-I-D TEST) Strp Use once daily Use as instructed.     ??? blood-glucose meter kit Use as directed     ??? ciclopirox (PENLAC) 8 % solution Apply topically.     ??? cyclobenzaprine (FLEXERIL) 10 MG tablet Take 10 mg by mouth.     ??? gabapentin (NEURONTIN) 600 MG tablet TAKE ONE TABLET BY MOUTH 3 TO 4 TIMES A DAY AS DIRECTED  3   ??? glimepiride (AMARYL) 4 MG tablet Take 4 mg by mouth.     ??? hydrOXYzine (ATARAX) 25 MG tablet TAKE 1 TABLET BY MOUTH NIGHTLY AS NEEDED FOR ITCHING OR ANXIETY  0   ??? lancets Misc 1 each.     ??? lidocaine (LIDODERM) 5 % patch Place on the skin.     ??? metFORMIN (GLUCOPHAGE) 1000 MG tablet Take 1,000 mg by mouth.     ??? multivitamin-minerals-lutein (MULTIVITAMIN 50 PLUS) Tab Take 1 tablet by mouth.     ??? naproxen (NAPROSYN) 500 MG tablet ??? ONETOUCH ULTRA BLUE TEST STRIP Strp TEST BLOOD SUGAR TWICE A DAY AS DIRECTED (DX E11.9)  12   ??? ONETOUCH ULTRA2 kit Use as directed.  0   ??? pioglitazone (ACTOS) 30 MG tablet Take 30 mg by mouth.     ??? pregabalin (LYRICA) 200 MG capsule Take 200 mg by mouth.     ??? testosterone cypionate (DEPOTESTOTERONE CYPIONATE) 200 mg/mL injection        No current facility-administered medications for this visit.        Social history: Retired International aid/development worker, Divorced, 3 children and 2 grandchildren. No smoking. 1-2 beers per month    Family history: Brother with liver disease NOS    BP 131/84  - Pulse 69  - Temp 37.1 ??C (98.8 ??F)  - Resp 14  - Ht 177.8 cm (5' 10)  - Wt 87.5 kg (192 lb 14.4 oz)  - SpO2 97%  - BMI 27.68 kg/m??     Pleasant individual in NAD    HEENT: Sclera are anicteric, no temporal muscle loss, oropharynx is negative  NECK: No thyromegaly  or lymphadenopathy, No carotid bruits  Chest: Clear to auscultation and percussion  Heart: S1, S2, RR, No murmurs  Abdomen: Soft, non-tender, non-distended, no hepatosplenomegaly, no masses appreciated, no ascites  Skin: No spider angiomata, No rashes  Extremities: Without pedal edema, no palmar erythema  Neuro: Grossly intact, No focal deficits    Results for orders placed or performed in visit on 06/07/17   Hepatitis A IgG   Result Value Ref Range    Hepatitis A IgG Reactive (A) Nonreactive   Hepatitis B Surface Antigen   Result Value Ref Range    Hepatitis B Surface Ag Nonreactive Nonreactive   Hepatitis B Surface Antibody   Result Value Ref Range    Hep B S Ab Reactive (A) Nonreactive, [place]    Hepatitis B Surface Ab Quant 125.09 (H) <8.00 m(IU)/mL   Hepatitis B Core Antibody, total   Result Value Ref Range    Hep B Core Total Ab Reactive (A) Nonreactive   Iron Level and TIBC   Result Value Ref Range    Iron 199 (H) 35 - 165 ug/dL    TIBC 161.0 (H) 960.4 - 479.0 mg/dL    Transferrin 540.9 (H) 200.0 - 380.0 mg/dL    Iron Saturation (%) 41 20 - 50 %   Ferritin   Result Value Ref Range    Ferritin 278.0 27.0 - 377.0 ng/mL   Comprehensive Metabolic Panel   Result Value Ref Range    Sodium 141 135 - 145 mmol/L    Potassium 4.6 3.5 - 5.0 mmol/L    Chloride 101 98 - 107 mmol/L    CO2 30.0 22.0 - 30.0 mmol/L    BUN 24 (H) 7 - 21 mg/dL    Creatinine 8.11 9.14 - 1.30 mg/dL    BUN/Creatinine Ratio 29     EGFR MDRD Non Af Amer >=60 >=60 mL/min/1.45m2    EGFR MDRD Af Amer >=60 >=60 mL/min/1.73m2    Anion Gap 10 9 - 15 mmol/L    Glucose 111 65 - 179 mg/dL    Calcium 9.9 8.5 - 78.2 mg/dL    Albumin 4.4 3.5 - 5.0 g/dL    Total Protein 8.6 (H) 6.5 - 8.3 g/dL    Total Bilirubin 0.7 0.0 - 1.2 mg/dL    AST 956 (H) 19 - 55 U/L    ALT 262 (H) 19 - 72 U/L    Alkaline Phosphatase 169 (H) 38 - 126 U/L   CBC   Result Value Ref Range    WBC 5.1 4.5 - 11.0 10*9/L    RBC 4.67 4.50 - 5.90 10*12/L    HGB 14.5 13.5 - 17.5 g/dL    HCT 21.3 08.6 - 57.8 %    MCV 94.8 80.0 - 100.0 fL    MCH 31.0 26.0 - 34.0 pg    MCHC 32.7 31.0 - 37.0 g/dL    RDW 46.9 62.9 - 52.8 %    MPV 8.8 7.0 - 10.0 fL    Platelet 193 150 - 440 10*9/L     Impression:    1.  Chronic hepatitis C genotype 1a. Possible underlying advanced fibrosis based on clinical status exam, laboratory data. APRI= 3.47, FIB-4 = 5.15. Today we discussed the natural history of HCV infection, including the risks for progression to cirrhosis, liver failure, liver cancer, and risks of hepatocellular carcinoma. Patient is aware of the need for continued follow-up and monitoring.  We discussed the importance of remaining abstinent from alcohol due to additive effects  on disease progression to cirrhosis.  We discussed potential treatment options that include all-oral regimens with low rates of side effects and high rates of cure (sustained virological response).     Patient will meet pharmacist, jane to discuss starting antiviral therapy. Liver ultrasound ordered.    2. Immune to HAV and HBV    3. HCC surveillance: In setting of possible advanced fibrosis, continue q 6 month liver ultrasound.      RTC 3 months or sooner once HCV medications are started to see Owens Shark, DNP    Alba Destine, M.D.  Professor of Medicine  Director, [facility]  [place] [facility] [place] at [place]    [phone]

## 2017-06-12 NOTE — Unmapped (Signed)
Was paged by lab that Fibrosure was canceled due to not protected from light when handling. Left message for pt to return my call to get it re-drawn.    Reached pt (06/14/17). Advised will need to get Fibrosure again. Pt can go to [facility]. Order placed in Epic to be completed at [facility]. Reports he has personal things going on and had not chance to listen to VM I left on 06/12/17. Advised to be on lookout for phone calls from [facility]. Pt verbalized understanding.

## 2017-06-16 DIAGNOSIS — B182 Chronic viral hepatitis C: Secondary | ICD-10-CM | POA: Diagnosis not present

## 2017-06-20 DIAGNOSIS — F3181 Bipolar II disorder: Secondary | ICD-10-CM | POA: Diagnosis not present

## 2017-06-20 DIAGNOSIS — Z79899 Other long term (current) drug therapy: Secondary | ICD-10-CM | POA: Diagnosis not present

## 2017-06-20 LAB — HCV LIVER FIBROSIS PANEL
ALPHA 2-MACROGLOBULINS, QN: 459 mg/dL — ABNORMAL HIGH (ref 110–276)
APOLIPOPROTEIN A-1: 215 mg/dL — ABNORMAL HIGH (ref 101–178)
BILIRUBIN TOTAL (HP): 0.2 mg/dL (ref 0.0–1.2)
HAPTOGLOBIN: 51 mg/dL (ref 34–200)

## 2017-06-20 LAB — GGT (SENDOUT): Lab: 628 — ABNORMAL HIGH

## 2017-06-22 ENCOUNTER — Ambulatory Visit: Admit: 2017-06-22 | Discharge: 2017-06-23 | Payer: PRIVATE HEALTH INSURANCE

## 2017-06-22 DIAGNOSIS — R768 Other specified abnormal immunological findings in serum: Principal | ICD-10-CM

## 2017-06-22 DIAGNOSIS — N281 Cyst of kidney, acquired: Secondary | ICD-10-CM | POA: Diagnosis not present

## 2017-06-22 DIAGNOSIS — R161 Splenomegaly, not elsewhere classified: Secondary | ICD-10-CM | POA: Diagnosis not present

## 2017-06-26 MED ORDER — LEDIPASVIR 90 MG-SOFOSBUVIR 400 MG TABLET: 1 | each | 2 refills | 0 days

## 2017-06-26 MED ORDER — LEDIPASVIR 90 MG-SOFOSBUVIR 400 MG TABLET
Freq: Every day | ORAL | 2 refills | 0.00000 days | Status: CP
Start: 2017-06-26 — End: 2017-06-26

## 2017-06-28 MED ORDER — GLECAPREVIR 100 MG-PIBRENTASVIR 40 MG TABLET: 3 | tablet | 2 refills | 0 days

## 2017-06-28 MED ORDER — GLECAPREVIR 100 MG-PIBRENTASVIR 40 MG TABLET
ORAL_TABLET | Freq: Every day | ORAL | 2 refills | 0.00000 days | Status: CP
Start: 2017-06-28 — End: 2018-06-28
  Filled 2017-08-30: qty 84, 28d supply, fill #0

## 2017-06-28 MED ORDER — GLECAPREVIR 100 MG-PIBRENTASVIR 40 MG TABLET: 3 | carton | Freq: Every day | 2 refills | 0 days | Status: AC

## 2017-06-28 NOTE — Unmapped (Signed)
PA denied for Harvoni, awaiting appeal.

## 2017-06-28 NOTE — Unmapped (Signed)
Per test claim for Mavyret at the [facility], patient needs Medication Assistance Program for Prior Authorization.

## 2017-06-28 NOTE — Unmapped (Signed)
Harvoni PA denied as nonformulary agent on pt's insurance. Will change treatment to Mavyret. Left message for pt to return my call to provide update regarding status.    B18.2 Hep C: yes    K74.60 Cirrhosis: yes  Child Pugh Score if applicable and for Medicaid pts: 5  Z94.4 Liver Transplant: no  ??  Genotype: 1a  (Ref Att 10/07/16 pg 8)  HCV RNA:  915,000 IU/ml on 09/13/16  (Ref Att 10/07/16 pg 5)   Fibrosis score: F3-4 on Fibrosure on 06/16/17, F4 based on APRI= 2.5 (For detection of cirrhosis, using an APRI cutoff score of 2.0 was more specific (91%) but less sensitive (46%).), FIB-4 = 5.15 (FIB-4 >3.25 would have a 97% specificity and a positive predictive value of 65% for advanced fibrosis.)  HIV Co-infection? no  Signs of liver decompensation? no  Previous treatment? naive  ??  Planned regimen: Mavyret x 12 wks  Urgency: Routine Request  ??  Prescribing Provider/NPI: Dr. Gavin Potters / [id number]  Please see Media tab under Chart Review on date 06/07/17 for patient signature/waiver forms.  Insurance: Medicare part D, Manufacturing engineer.

## 2017-06-30 NOTE — Unmapped (Signed)
[facility] Specialty Medication Referral: PA APPROVED    Medication (Brand/Generic): Mavyret    Initial FSI Test Claim completed with resulted information below:  No PA required  Patient ABLE to fill at [facility]  Insurance Company:  [facility]  Anticipated Copay: $5.00  Is anticipated copay with a copay card or grant? Yes    As Co-pay is under $100 defined limit, per policy there will be no further investigation of need for financial assistance at this time unless patient requests. This referral has been communicated to the provider and handed off to the [facility] team for further processing and filling of prescribed medication.   ______________________________________________________________________  Please utilize this referral for viewing purposes as it will serve as the central location for all relevant documentation and updates.

## 2017-07-05 NOTE — Unmapped (Signed)
Initial Counseling for HCV treatment     Planned regimen: Mavyret (glecaprevir/pibrentasvir 100/40 mg) 3 tabs daily x 12 weeks  Planned start date: 07/07/17    Pharmacy: [facility] [phone] option #4    PMH:   1. Orthopedic surgery on knees and hips  2. + Diabetes  3. No hx of HTN, CAD, lung disease  4. Anxiety disorder/hx of depression  5. Immune to HAV and HBV  6. FIBROSCAN 05/2017: Not performed due to bladder stimulator in place    Current Meds:   Current Outpatient Medications   Medication Sig Dispense Refill   ??? blood sugar diagnostic (PRECISION Q-I-D TEST) Strp Use once daily Use as instructed.     ??? blood-glucose meter kit Use as directed     ??? ciclopirox (PENLAC) 8 % solution Apply topically.     ??? cyclobenzaprine (FLEXERIL) 10 MG tablet Take 10 mg by mouth.     ??? gabapentin (NEURONTIN) 600 MG tablet TAKE ONE TABLET BY MOUTH 3 TO 4 TIMES A DAY AS DIRECTED  3   ??? glecaprevir-pibrentasvir (MAVYRET) 100-40 mg tablet Take 3 tablets by mouth daily. Take with food. 1 4-week carton 2   ??? Jardiance      ??? hydrOXYzine (ATARAX) 25 MG tablet TAKE 1 TABLET BY MOUTH NIGHTLY AS NEEDED FOR ITCHING OR ANXIETY  0   ??? lancets Misc 1 each.     ??? lidocaine (LIDODERM) 5 % patch Place on the skin.     ??? metFORMIN (GLUCOPHAGE) 1000 MG tablet Take 1,000 mg by mouth.     ??? multivitamin-minerals-lutein (MULTIVITAMIN 50 PLUS) Tab Take 1 tablet by mouth.     ??? naproxen (NAPROSYN) 500 MG tablet      ??? ONETOUCH ULTRA BLUE TEST STRIP Strp TEST BLOOD SUGAR TWICE A DAY AS DIRECTED (DX E11.9)  12   ??? ONETOUCH ULTRA2 kit Use as directed.  0   ??? pioglitazone (ACTOS) 30 MG tablet Take 30 mg by mouth.     ??? pregabalin (LYRICA) 200 MG capsule Take 200 mg by mouth.     ??? testosterone cypionate (DEPOTESTOTERONE CYPIONATE) 200 mg/mL injection          Patient is ready to start Mavyret.    Following topics were discussed during counseling:   Patient Counseling    Counseled the patient on the following:  doses and administration discussed, safe handling, storage, and disposal discussed, possible adverse effects and management discussed, possible drug and prescription drug interactions discussed, possible drug and OTC drug and food interactions discussed, lab monitoring and follow-up discussed, cost of medications and cost implications discussed, adherence and missed doses discussed, pharmacy contact information discussed        1. Indications for medication, dosage and administration.     A. Mavyret (100/40 mg) 3 tablets to take daily with food. Patient plans to take in the afternoons around 5pm with dinner.     2. Common side effects of medications and management strategies. (fatigue, headache)      3. Importance of adherence to regimen, follow-up clinic visits and lab monitoring.   Medication Adherence    Demonstrates understanding of importance of adherence:  yes  Informant:  patient  Patient is at risk for Non-Adherence:  No  Reasons for non-adherence:  no problems identified  Confirmed plan for next specialty medication refill:  delivery by pharmacy         A. Asked patient to call Kendall Kras [phone] to establish  start date for treatment and to schedule appointment 4 weeks before starting treatment.      4. Drug-drug interaction.  Drug Interactions    Drug interactions evaluated:  yes  Clinically relevant drug interactions identified:  no         A. Current medications have been reviewed and assessed for possible interaction.    Denies use of herbal medication such as milk thistle or [facility] John's wart.  Allergies have been verified. Agreed to abstain from alcohol.     5. Importance of informing pharmacy and clinic of updated contact information.  Stressed importance of being able to reach over the phone to set up refills. Advised patient to call pharmacy when down to about 7 day supply left to ensure there's no interruption in therapy.  Refill Coordination    Has the Patients' Contact Information Changed:  No Is the Shipping Address Different:  No       Shipping Information    Delivery Scheduled:  Yes  Delivery Date:  07/07/17  Medications to be Shipped:  Mavyret       Patient verbalized understanding. Provided contact information for any questions/concerns.       Park Breed, Pharm D., BCPS, BCGP, CPP  [facility]  [address]  [place], [place] [address]  [phone]      July 05, 2017 3:36 PM

## 2017-07-05 NOTE — Unmapped (Signed)
[facility] Specialty Medication Referral: PA APPROVED    Medication (Brand/Generic): Mavyret    Initial FSI Test Claim completed with resulted information below:  No PA required  Patient ABLE to fill at [facility]  Insurance Company:  [facility] [employer]  Anticipated Copay: $0.00  Is anticipated copay with a copay card or grant? Yes    As Co-pay is under $100 defined limit, per policy there will be no further investigation of need for financial assistance at this time unless patient requests. This referral has been communicated to the provider and handed off to the [facility] team for further processing and filling of prescribed medication.   ______________________________________________________________________  Please utilize this referral for viewing purposes as it will serve as the central location for all relevant documentation and updates.

## 2017-07-06 MED FILL — MAVYRET/100-40MG/TABS: MAVYRET/100-40MG/TABS | 28 days supply | Qty: 84 | Fill #0

## 2017-07-17 DIAGNOSIS — G894 Chronic pain syndrome: Secondary | ICD-10-CM | POA: Diagnosis not present

## 2017-07-17 DIAGNOSIS — M25551 Pain in right hip: Secondary | ICD-10-CM | POA: Diagnosis not present

## 2017-07-17 DIAGNOSIS — M25552 Pain in left hip: Secondary | ICD-10-CM | POA: Diagnosis not present

## 2017-07-17 DIAGNOSIS — M545 Low back pain: Secondary | ICD-10-CM | POA: Diagnosis not present

## 2017-07-17 DIAGNOSIS — M25512 Pain in left shoulder: Secondary | ICD-10-CM | POA: Diagnosis not present

## 2017-07-17 DIAGNOSIS — Z79891 Long term (current) use of opiate analgesic: Secondary | ICD-10-CM | POA: Diagnosis not present

## 2017-07-17 DIAGNOSIS — M25562 Pain in left knee: Secondary | ICD-10-CM | POA: Diagnosis not present

## 2017-07-24 NOTE — Unmapped (Signed)
Reason for call: Pt received his Mavyret and started tx.     Hep C  Genotype: 1a????(Ref Att 10/07/16 pg 8)  Treatment: Mavyret x 12 wks  Start date: 07/17/17  Fibrosis:F3-4 on Fibrosure on 06/16/17, F4 based on APRI= 2.5 (For detection of cirrhosis, using an APRI cutoff score of 2.0 was more specific (91%) but less sensitive (46%).), FIB-4 = 5.15 (FIB-4 >3.25 would have a 97% specificity and a positive predictive value of 65% for advanced fibrosis.)  TW # 1    Received a call from [facility] that pt would like a return call. Called and spoke with pt at phone number [phone]. Pt wanted to let us know he started Mavyret on 07/17/17. Pt is taking every evening with his dinner between 5 & 7 pm. Pt denies any side effects. Pt denies starting any new medications. New apt coordinated for 08/24/17 @ 1400 with Owens Shark, DNP. Provided contact information if pt has any questions or concerns.     Vertell Limber RN, BSN  Nursing Care Coordinator   Pharmacy Adult GI Medicine  [facility]  [address]   [place], [place] [address]  [phone]

## 2017-07-24 NOTE — Unmapped (Signed)
1418: called patient back. He wants to talk to the pharmacist. I let him know I will reach her and have her call back. Patient says thank you.      1412: patient left message for someone to call him. Did not say why.

## 2017-08-04 NOTE — Unmapped (Signed)
[facility] Specialty Pharmacy Refill Coordination Note  Specialty Medication(s): MAVYRET  Additional Medications shipped: Darryl Koch, DOB: October 30, 1955  Phone: [phone] (home) , Alternate phone contact: N/A  Phone or address changes today?: No  All above HIPAA information was verified with patient.  Shipping Address: [address]  [place] [address]   Insurance changes? No    Completed refill call assessment today to schedule patient's medication shipment from the [facility] [phone]).      Confirmed the medication and dosage are correct and have not changed: Yes, regimen is correct and unchanged.    Confirmed patient started or stopped the following medications in the past month:  No, there are no changes reported at this time.    Are you tolerating your medication?:  Slevin reports side effects of recent gas - said not related to any food changes, will discuss it at next appt,, not overly concerned and no other symptoms, i will message clinic just as an fyi.    ADHERENCE    Did you miss any doses in the past 4 weeks? Yes.  Zachari reports missing 1 dose days of medication therapy in the last 4 weeks.  Jackson reports forgot as the cause of their non-adherance.Patient states since then he went out and got a pillbox and has not missed any since then. Also counseled on using alarm if needed.    FINANCIAL/SHIPPING    Delivery Scheduled: Yes, Expected medication delivery date: 08/08/17 via ups     The patient will receive an FSI print out for each medication shipped and additional FDA Medication Guides as required.  Patient education from [place] or Robet Leu may also be included in the shipment    Advik did not have any additional questions at this time.    Delivery address validated in FSI scheduling system: Yes, address listed in FSI is correct.    We will follow up with patient monthly for standard refill processing and delivery.      Thank you,  Thad Ranger   [facility] Specialty Pharmacist

## 2017-08-06 MED FILL — MAVYRET/100-40MG/TABS: MAVYRET/100-40MG/TABS | 28 days supply | Qty: 84 | Fill #1

## 2017-08-14 DIAGNOSIS — M25551 Pain in right hip: Secondary | ICD-10-CM | POA: Diagnosis not present

## 2017-08-14 DIAGNOSIS — M25512 Pain in left shoulder: Secondary | ICD-10-CM | POA: Diagnosis not present

## 2017-08-14 DIAGNOSIS — M25552 Pain in left hip: Secondary | ICD-10-CM | POA: Diagnosis not present

## 2017-08-14 DIAGNOSIS — G894 Chronic pain syndrome: Secondary | ICD-10-CM | POA: Diagnosis not present

## 2017-08-14 DIAGNOSIS — M25562 Pain in left knee: Secondary | ICD-10-CM | POA: Diagnosis not present

## 2017-08-14 DIAGNOSIS — Z79891 Long term (current) use of opiate analgesic: Secondary | ICD-10-CM | POA: Diagnosis not present

## 2017-08-14 DIAGNOSIS — M545 Low back pain: Secondary | ICD-10-CM | POA: Diagnosis not present

## 2017-08-16 DIAGNOSIS — E119 Type 2 diabetes mellitus without complications: Secondary | ICD-10-CM | POA: Diagnosis not present

## 2017-08-16 DIAGNOSIS — E1140 Type 2 diabetes mellitus with diabetic neuropathy, unspecified: Secondary | ICD-10-CM | POA: Diagnosis not present

## 2017-08-16 DIAGNOSIS — G894 Chronic pain syndrome: Secondary | ICD-10-CM | POA: Diagnosis not present

## 2017-08-16 DIAGNOSIS — B1920 Unspecified viral hepatitis C without hepatic coma: Secondary | ICD-10-CM | POA: Diagnosis not present

## 2017-08-16 DIAGNOSIS — E559 Vitamin D deficiency, unspecified: Secondary | ICD-10-CM | POA: Diagnosis not present

## 2017-08-16 DIAGNOSIS — E7800 Pure hypercholesterolemia, unspecified: Secondary | ICD-10-CM | POA: Diagnosis not present

## 2017-08-16 DIAGNOSIS — M25512 Pain in left shoulder: Secondary | ICD-10-CM | POA: Diagnosis not present

## 2017-08-24 NOTE — Unmapped (Signed)
Patient is doing ok at this time    [facility] Refill Coordination Note    Specialty Medication(s) to be Shipped:   Infectious Disease: Mavyret    Other medication(s) to be shipped: n/a     Darryl Koch, DOB: 09-24-1955  Phone: [phone] (home)   Shipping Address: [address]  [place] [address]    All above HIPAA information was verified with patient.     Completed refill call assessment today to schedule patient's medication shipment from the [facility] [phone]).       Specialty medication(s) and dose(s) confirmed: Regimen is correct and unchanged.   Changes to medications: Darryl Koch reports no changes reported at this time.  Changes to insurance: No  Questions for the pharmacist: No    The patient will receive a drug information handout for each medication shipped and additional FDA Medication Guides as required.      DISEASE/MEDICATION-SPECIFIC INFORMATION        For Hepatitis C patients:  Treatment start date: 07/17/17  Current treatment week: 5    ADHERENCE     Medication Adherence    Patient reported X missed doses in the last month:  0  Specialty Medication:  mavyret  Patient is on additional specialty medications:  No  Patient is on more than two specialty medications:  No  Any gaps in refill history greater than 2 weeks in the last 3 months:  no  Demonstrates understanding of importance of adherence:  yes  Informant:  patient  Reliability of informant:  reliable  Confirmed plan for next specialty medication refill:  delivery by pharmacy  Refills needed for supportive medications:  not needed          Refill Coordination    Has the Patients' Contact Information Changed:  No  Is the Shipping Address Different:  No           SHIPPING     Shipping address confirmed in Epic.     Delivery Scheduled: Yes, Expected medication delivery date: 08/31/17 via UPS or courier.     Darryl Koch   [facility] Specialty Technician

## 2017-08-28 ENCOUNTER — Other Ambulatory Visit (HOSPITAL_COMMUNITY): Payer: Self-pay | Admitting: Orthopedic Surgery

## 2017-08-28 ENCOUNTER — Other Ambulatory Visit: Payer: Self-pay | Admitting: Orthopedic Surgery

## 2017-08-28 DIAGNOSIS — M25562 Pain in left knee: Secondary | ICD-10-CM | POA: Diagnosis not present

## 2017-08-28 DIAGNOSIS — B999 Unspecified infectious disease: Secondary | ICD-10-CM | POA: Diagnosis not present

## 2017-08-28 DIAGNOSIS — M25512 Pain in left shoulder: Secondary | ICD-10-CM | POA: Diagnosis not present

## 2017-08-30 MED FILL — MAVYRET 100 MG-40 MG TABLET: 28 days supply | Qty: 84 | Fill #0 | Status: AC

## 2017-08-31 ENCOUNTER — Ambulatory Visit (HOSPITAL_COMMUNITY): Payer: PPO

## 2017-09-08 ENCOUNTER — Encounter (HOSPITAL_COMMUNITY)
Admission: RE | Admit: 2017-09-08 | Discharge: 2017-09-08 | Disposition: A | Discharge status: Home / Self Care | Payer: PPO | Source: Ambulatory Visit | Attending: Orthopedic Surgery | Admitting: Orthopedic Surgery

## 2017-09-08 DIAGNOSIS — M25562 Pain in left knee: Secondary | ICD-10-CM | POA: Insufficient documentation

## 2017-09-08 DIAGNOSIS — M1711 Unilateral primary osteoarthritis, right knee: Secondary | ICD-10-CM | POA: Diagnosis not present

## 2017-09-08 MED ORDER — TECHNETIUM TC 99M MEDRONATE IV KIT
21.5000 | PACK | Freq: Once | INTRAVENOUS | Status: AC | PRN
  Administered 2017-09-08: 21.5 via INTRAVENOUS

## 2017-09-12 DIAGNOSIS — M25562 Pain in left knee: Secondary | ICD-10-CM | POA: Diagnosis not present

## 2017-09-13 ENCOUNTER — Other Ambulatory Visit: Payer: Self-pay | Admitting: Orthopedic Surgery

## 2017-09-13 DIAGNOSIS — M25551 Pain in right hip: Secondary | ICD-10-CM | POA: Diagnosis not present

## 2017-09-13 DIAGNOSIS — M25562 Pain in left knee: Secondary | ICD-10-CM | POA: Diagnosis not present

## 2017-09-13 DIAGNOSIS — M75102 Unspecified rotator cuff tear or rupture of left shoulder, not specified as traumatic: Secondary | ICD-10-CM

## 2017-09-13 DIAGNOSIS — Z79891 Long term (current) use of opiate analgesic: Secondary | ICD-10-CM | POA: Diagnosis not present

## 2017-09-13 DIAGNOSIS — M545 Low back pain: Secondary | ICD-10-CM | POA: Diagnosis not present

## 2017-09-13 DIAGNOSIS — M25552 Pain in left hip: Secondary | ICD-10-CM | POA: Diagnosis not present

## 2017-09-13 DIAGNOSIS — G894 Chronic pain syndrome: Secondary | ICD-10-CM | POA: Diagnosis not present

## 2017-09-13 DIAGNOSIS — M25512 Pain in left shoulder: Secondary | ICD-10-CM | POA: Diagnosis not present

## 2017-09-25 NOTE — Unmapped (Signed)
Follow-Up Counseling for HCV Treatment      Regimen: Mavyret (glecaprevir/pibrentasvir 100/40 mg)  x 12 weeks  Start Date: 07/16/17   Completed Treatment Week #10    Pharmacy: [facility] [phone]     Following topics were reviewed during the phone call:  Darryl Koch Counseling    Counseled the Darryl Koch on the following:  doses and administration discussed, possible adverse effects and management discussed, possible drug and prescription drug interactions discussed, possible drug and OTC drug and food interactions discussed, lab monitoring and follow-up discussed, therapeutic rationale discussed, adherence and missed doses discussed, pharmacy contact information discussed         1. Medication administration - Takes Mavyret 3 tabs every night with dinner at 5:00 PM.     2. Importance of adherence -   Medication Adherence    Darryl Koch reported X missed doses in the last month:  0  Specialty Medication:  Mavyret  Darryl Koch is on additional specialty medications:  No  Darryl Koch is on more than two specialty medications:  No  Any gaps in refill history greater than 2 weeks in the last 3 months:  no  Demonstrates understanding of importance of adherence:  yes  Informant:  Darryl Koch  Reliability of informant:  reliable  Provider-estimated medication adherence level:  90-100%  Darryl Koch is at risk for Non-Adherence:  No  Reasons for non-adherence:  no problems identified  Adherence tools used:  directed education       Pt reports having 13 packets left. Pt has not taken today's dose. Count is appropriate with adjusted start date of 07/16/17. Pt denies missing any doses.     3. Side effects - Pt reports having some fatigue. Pt reports drinking at least 4 bottles of water daily. Pt encouraged to increase his protein content to help with energy. Pt reports this last week being rough d/t his mother having 2 strokes.   Adverse Effects    Fatigue:  Pos         4. Drug-drug interaction - Pt denies starting any new medications. Pt denies any alcohol use.   Drug Interactions    Drug interactions evaluated:  yes  Clinically relevant drug interactions identified:  no         5. Follow up - Has follow up appointment scheduled in HCV treatment clinic on 10/02/17@ 1430 with Owens Shark, DNP. Informed Darryl Koch that there is still a small chance of relapse after finishing the treatment. Stressed importance of follow up 3 months post treatment to assess for cure.      All questions were answered.      Vertell Limber RN, [facility]   Pharmacy Adult GI Medicine  [facility]  [address]   [place], [place] [address]  [phone]    September 25, 2017 11:50 AM

## 2017-09-27 ENCOUNTER — Ambulatory Visit
Admission: RE | Admit: 2017-09-27 | Discharge: 2017-09-27 | Disposition: A | Discharge status: Home / Self Care | Payer: PPO | Source: Ambulatory Visit | Attending: Orthopedic Surgery | Admitting: Orthopedic Surgery

## 2017-09-27 DIAGNOSIS — M75102 Unspecified rotator cuff tear or rupture of left shoulder, not specified as traumatic: Secondary | ICD-10-CM

## 2017-09-27 DIAGNOSIS — M25512 Pain in left shoulder: Secondary | ICD-10-CM | POA: Diagnosis not present

## 2017-09-27 DIAGNOSIS — S46012A Strain of muscle(s) and tendon(s) of the rotator cuff of left shoulder, initial encounter: Secondary | ICD-10-CM | POA: Diagnosis not present

## 2017-09-27 MED ORDER — IOPAMIDOL (ISOVUE-M 200) INJECTION 41%
15.0000 mL | Freq: Once | INTRAMUSCULAR | Status: AC
  Administered 2017-09-27: 15 mL via INTRA_ARTICULAR

## 2017-09-29 DIAGNOSIS — M25512 Pain in left shoulder: Secondary | ICD-10-CM | POA: Diagnosis not present

## 2017-10-11 DIAGNOSIS — M25562 Pain in left knee: Secondary | ICD-10-CM | POA: Diagnosis not present

## 2017-10-11 DIAGNOSIS — Z79891 Long term (current) use of opiate analgesic: Secondary | ICD-10-CM | POA: Diagnosis not present

## 2017-10-11 DIAGNOSIS — M25552 Pain in left hip: Secondary | ICD-10-CM | POA: Diagnosis not present

## 2017-10-11 DIAGNOSIS — M25551 Pain in right hip: Secondary | ICD-10-CM | POA: Diagnosis not present

## 2017-10-11 DIAGNOSIS — G894 Chronic pain syndrome: Secondary | ICD-10-CM | POA: Diagnosis not present

## 2017-10-11 DIAGNOSIS — M545 Low back pain: Secondary | ICD-10-CM | POA: Diagnosis not present

## 2017-10-11 DIAGNOSIS — M25512 Pain in left shoulder: Secondary | ICD-10-CM | POA: Diagnosis not present

## 2017-10-26 DIAGNOSIS — Y999 Unspecified external cause status: Secondary | ICD-10-CM | POA: Diagnosis not present

## 2017-10-26 DIAGNOSIS — M7552 Bursitis of left shoulder: Secondary | ICD-10-CM | POA: Diagnosis not present

## 2017-10-26 DIAGNOSIS — M7542 Impingement syndrome of left shoulder: Secondary | ICD-10-CM | POA: Diagnosis not present

## 2017-10-26 DIAGNOSIS — M7522 Bicipital tendinitis, left shoulder: Secondary | ICD-10-CM | POA: Diagnosis not present

## 2017-10-26 DIAGNOSIS — X58XXXA Exposure to other specified factors, initial encounter: Secondary | ICD-10-CM | POA: Diagnosis not present

## 2017-10-26 DIAGNOSIS — G8918 Other acute postprocedural pain: Secondary | ICD-10-CM | POA: Diagnosis not present

## 2017-10-26 DIAGNOSIS — M24112 Other articular cartilage disorders, left shoulder: Secondary | ICD-10-CM | POA: Diagnosis not present

## 2017-10-26 DIAGNOSIS — S46012A Strain of muscle(s) and tendon(s) of the rotator cuff of left shoulder, initial encounter: Secondary | ICD-10-CM | POA: Diagnosis not present

## 2017-11-06 ENCOUNTER — Ambulatory Visit: Payer: Self-pay | Admitting: Family

## 2017-11-06 DIAGNOSIS — M24112 Other articular cartilage disorders, left shoulder: Secondary | ICD-10-CM | POA: Diagnosis not present

## 2017-11-07 DIAGNOSIS — M62522 Muscle wasting and atrophy, not elsewhere classified, left upper arm: Secondary | ICD-10-CM | POA: Diagnosis not present

## 2017-11-07 DIAGNOSIS — M25612 Stiffness of left shoulder, not elsewhere classified: Secondary | ICD-10-CM | POA: Diagnosis not present

## 2017-11-07 DIAGNOSIS — M6281 Muscle weakness (generalized): Secondary | ICD-10-CM | POA: Diagnosis not present

## 2017-11-07 DIAGNOSIS — M25512 Pain in left shoulder: Secondary | ICD-10-CM | POA: Diagnosis not present

## 2017-11-08 DIAGNOSIS — M25562 Pain in left knee: Secondary | ICD-10-CM | POA: Diagnosis not present

## 2017-11-08 DIAGNOSIS — Z79891 Long term (current) use of opiate analgesic: Secondary | ICD-10-CM | POA: Diagnosis not present

## 2017-11-08 DIAGNOSIS — E1140 Type 2 diabetes mellitus with diabetic neuropathy, unspecified: Secondary | ICD-10-CM | POA: Diagnosis not present

## 2017-11-08 DIAGNOSIS — M879 Osteonecrosis, unspecified: Secondary | ICD-10-CM | POA: Diagnosis not present

## 2017-11-08 DIAGNOSIS — G894 Chronic pain syndrome: Secondary | ICD-10-CM | POA: Diagnosis not present

## 2017-11-08 DIAGNOSIS — M25551 Pain in right hip: Secondary | ICD-10-CM | POA: Diagnosis not present

## 2017-11-08 DIAGNOSIS — M545 Low back pain: Secondary | ICD-10-CM | POA: Diagnosis not present

## 2017-11-08 DIAGNOSIS — M25512 Pain in left shoulder: Secondary | ICD-10-CM | POA: Diagnosis not present

## 2017-11-08 DIAGNOSIS — M25552 Pain in left hip: Secondary | ICD-10-CM | POA: Diagnosis not present

## 2017-11-09 DIAGNOSIS — M62522 Muscle wasting and atrophy, not elsewhere classified, left upper arm: Secondary | ICD-10-CM | POA: Diagnosis not present

## 2017-11-09 DIAGNOSIS — M25512 Pain in left shoulder: Secondary | ICD-10-CM | POA: Diagnosis not present

## 2017-11-09 DIAGNOSIS — M25612 Stiffness of left shoulder, not elsewhere classified: Secondary | ICD-10-CM | POA: Diagnosis not present

## 2017-11-09 DIAGNOSIS — M6281 Muscle weakness (generalized): Secondary | ICD-10-CM | POA: Diagnosis not present

## 2017-11-10 ENCOUNTER — Encounter: Payer: Self-pay | Admitting: Family Medicine

## 2017-11-10 ENCOUNTER — Ambulatory Visit (INDEPENDENT_AMBULATORY_CARE_PROVIDER_SITE_OTHER): Payer: PPO | Admitting: Family Medicine

## 2017-11-10 VITALS — BP 148/88 | HR 74 | Temp 98.3°F | Ht 69.0 in | Wt 215.0 lb

## 2017-11-10 DIAGNOSIS — R5383 Other fatigue: Secondary | ICD-10-CM | POA: Diagnosis not present

## 2017-11-10 DIAGNOSIS — E1169 Type 2 diabetes mellitus with other specified complication: Secondary | ICD-10-CM

## 2017-11-10 DIAGNOSIS — F329 Major depressive disorder, single episode, unspecified: Secondary | ICD-10-CM | POA: Diagnosis not present

## 2017-11-10 DIAGNOSIS — R7989 Other specified abnormal findings of blood chemistry: Secondary | ICD-10-CM

## 2017-11-10 DIAGNOSIS — F419 Anxiety disorder, unspecified: Secondary | ICD-10-CM | POA: Diagnosis not present

## 2017-11-10 DIAGNOSIS — N529 Male erectile dysfunction, unspecified: Secondary | ICD-10-CM | POA: Diagnosis not present

## 2017-11-10 DIAGNOSIS — F5101 Primary insomnia: Secondary | ICD-10-CM | POA: Diagnosis not present

## 2017-11-10 MED ORDER — ZOLPIDEM TARTRATE 10 MG PO TABS: 10.0000 mg | ORAL_TABLET | Freq: Every evening | ORAL | 0 refills | Status: DC | PRN
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
## 2017-11-13 ENCOUNTER — Encounter: Payer: Self-pay | Admitting: Family Medicine

## 2017-11-13 DIAGNOSIS — M25612 Stiffness of left shoulder, not elsewhere classified: Secondary | ICD-10-CM | POA: Diagnosis not present

## 2017-11-13 DIAGNOSIS — M62522 Muscle wasting and atrophy, not elsewhere classified, left upper arm: Secondary | ICD-10-CM | POA: Diagnosis not present

## 2017-11-13 DIAGNOSIS — M25512 Pain in left shoulder: Secondary | ICD-10-CM | POA: Diagnosis not present

## 2017-11-13 DIAGNOSIS — M6281 Muscle weakness (generalized): Secondary | ICD-10-CM | POA: Diagnosis not present

## 2017-11-17 ENCOUNTER — Telehealth: Payer: Self-pay | Admitting: Family Medicine

## 2017-11-17 ENCOUNTER — Other Ambulatory Visit: Payer: Self-pay | Admitting: Family Medicine

## 2017-11-17 DIAGNOSIS — G894 Chronic pain syndrome: Secondary | ICD-10-CM

## 2017-11-17 MED ORDER — GABAPENTIN 600 MG PO TABS: 600.0000 mg | ORAL_TABLET | Freq: Three times a day (TID) | ORAL | 1 refills | Status: DC

## 2017-11-17 NOTE — Telephone Encounter (Signed)
Patient concern on script refill. Please advise?

## 2017-11-17 NOTE — Telephone Encounter (Signed)
Called Pt told him his Rx was sent into the pharmacy.

## 2017-11-17 NOTE — Telephone Encounter (Signed)
Pt called for refill for gabapentin 600 mg tab. Med was discontinued on 11/10/17.

## 2017-11-17 NOTE — Telephone Encounter (Signed)
Review for refill for gabapentin 600 mg tab. Med was discontinued on 11/10/17.  Provider  Philis Nettle, NP

## 2017-11-17 NOTE — Telephone Encounter (Signed)
Refill sent in

## 2017-11-17 NOTE — Telephone Encounter (Signed)
Does he take the gabapentin and the lyrica together? They are similar medications

## 2017-11-17 NOTE — Telephone Encounter (Signed)
Copied from [place] [id number]. Topic: Quick Communication - Rx Refill/Question >> Nov 17, 2017  1:32 PM Selinda Flavin B, NT wrote: Medication: gabapentin (NEURONTIN) 600 MG tablet  Has the patient contacted their pharmacy? Yes.  Call office. Rx expired (Agent: If no, request that the patient contact the pharmacy for the refill.) (Agent: If yes, when and what did the pharmacy advise?)  Preferred Pharmacy (with phone number or street name): [id number] [place], [place]: Please be advised that RX refills may take up to 3 business days. We ask that you follow-up with your pharmacy.

## 2017-11-17 NOTE — Telephone Encounter (Signed)
Called Pt about Gabapentin Rx, and Pt stated Yes he is using both at the same time but he just ran out of his last refill and he no longer see Dr. Luetta Nutting

## 2017-11-21 ENCOUNTER — Other Ambulatory Visit: Payer: Self-pay

## 2017-11-22 ENCOUNTER — Other Ambulatory Visit (INDEPENDENT_AMBULATORY_CARE_PROVIDER_SITE_OTHER): Payer: PPO

## 2017-11-22 DIAGNOSIS — N529 Male erectile dysfunction, unspecified: Secondary | ICD-10-CM

## 2017-11-22 DIAGNOSIS — R7989 Other specified abnormal findings of blood chemistry: Secondary | ICD-10-CM | POA: Diagnosis not present

## 2017-11-22 DIAGNOSIS — R5383 Other fatigue: Secondary | ICD-10-CM

## 2017-11-22 DIAGNOSIS — E1169 Type 2 diabetes mellitus with other specified complication: Secondary | ICD-10-CM | POA: Diagnosis not present

## 2017-11-22 LAB — VITAMIN D 25 HYDROXY (VIT D DEFICIENCY, FRACTURES): VITD: 47.81 ng/mL (ref 30.00–100.00)

## 2017-11-22 LAB — THYROID PANEL WITH TSH
Free Thyroxine Index: 2.9 (ref 1.4–3.8)
T3 Uptake: 30 % (ref 22–35)
T4, Total: 9.6 ug/dL (ref 4.9–10.5)
TSH: 1.79 mIU/L (ref 0.40–4.50)

## 2017-11-22 LAB — TESTOSTERONE: Testosterone: 65.37 ng/dL — ABNORMAL LOW (ref 300.00–890.00)

## 2017-11-22 LAB — COMPREHENSIVE METABOLIC PANEL
ALT: 14 U/L (ref 0–53)
AST: 16 U/L (ref 0–37)
Albumin: 4.3 g/dL (ref 3.5–5.2)
Alkaline Phosphatase: 62 U/L (ref 39–117)
BUN: 27 mg/dL — ABNORMAL HIGH (ref 6–23)
CO2: 32 mEq/L (ref 19–32)
Calcium: 9.7 mg/dL (ref 8.4–10.5)
Chloride: 102 mEq/L (ref 96–112)
Creatinine, Ser: 1.11 mg/dL (ref 0.40–1.50)
GFR: 71.58 mL/min (ref >60.00)
Glucose, Bld: 142 mg/dL — ABNORMAL HIGH (ref 70–99)
Potassium: 4.3 mEq/L (ref 3.5–5.1)
Sodium: 140 mEq/L (ref 135–145)
Total Bilirubin: 0.5 mg/dL (ref 0.2–1.2)
Total Protein: 7 g/dL (ref 6.0–8.3)

## 2017-11-22 LAB — CBC WITH DIFFERENTIAL/PLATELET
Basophils Absolute: 0 10*3/uL (ref 0.0–0.1)
Basophils Relative: 0.7 % (ref 0.0–3.0)
Eosinophils Absolute: 0.3 10*3/uL (ref 0.0–0.7)
Eosinophils Relative: 5.2 % — ABNORMAL HIGH (ref 0.0–5.0)
HCT: 40.3 % (ref 39.0–52.0)
Hemoglobin: 14.1 g/dL (ref 13.0–17.0)
Lymphocytes Relative: 35.9 % (ref 12.0–46.0)
Lymphs Abs: 1.8 10*3/uL (ref 0.7–4.0)
MCHC: 34.9 g/dL (ref 30.0–36.0)
MCV: 88.8 fl (ref 78.0–100.0)
Monocytes Absolute: 0.4 10*3/uL (ref 0.1–1.0)
Monocytes Relative: 8.8 % (ref 3.0–12.0)
Neutro Abs: 2.5 10*3/uL (ref 1.4–7.7)
Neutrophils Relative %: 49.4 % (ref 43.0–77.0)
Platelets: 144 10*3/uL — ABNORMAL LOW (ref 150.0–400.0)
RBC: 4.54 Mil/uL (ref 4.22–5.81)
RDW: 14.6 % (ref 11.5–15.5)
WBC: 5 10*3/uL (ref 4.0–10.5)

## 2017-11-22 LAB — B12 AND FOLATE PANEL
Folate: 14.7 ng/mL (ref >5.9)
Vitamin B-12: 244 pg/mL (ref 211–911)

## 2017-11-22 LAB — LIPID PANEL
Cholesterol: 163 mg/dL (ref 0–200)
HDL: 68.7 mg/dL (ref >39.00)
LDL Cholesterol: 65 mg/dL (ref 0–99)
NonHDL: 94.69
Total CHOL/HDL Ratio: 2
Triglycerides: 150 mg/dL — ABNORMAL HIGH (ref 0.0–149.0)
VLDL: 30 mg/dL (ref 0.0–40.0)

## 2017-11-22 LAB — HEMOGLOBIN A1C: Hgb A1c MFr Bld: 7.3 % — ABNORMAL HIGH (ref 4.6–6.5)

## 2017-11-24 ENCOUNTER — Ambulatory Visit: Payer: Self-pay | Admitting: Family Medicine

## 2017-11-24 DIAGNOSIS — Z0289 Encounter for other administrative examinations: Secondary | ICD-10-CM
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
## 2018-01-22 DIAGNOSIS — M25512 Pain in left shoulder: Secondary | ICD-10-CM | POA: Diagnosis not present

## 2018-03-28 ENCOUNTER — Encounter: Payer: Self-pay | Admitting: *Deleted

## 2018-10-08 IMAGING — DX DG FOOT COMPLETE 3+V*R*
3 series · 3 of 3 positions shown · non-contrast
Comparison: None.

CLINICAL DATA: Fourth digit ulcer, concern for osteomyelitis.

EXAM:
RIGHT FOOT COMPLETE - 3+ VIEW

[foot ap]
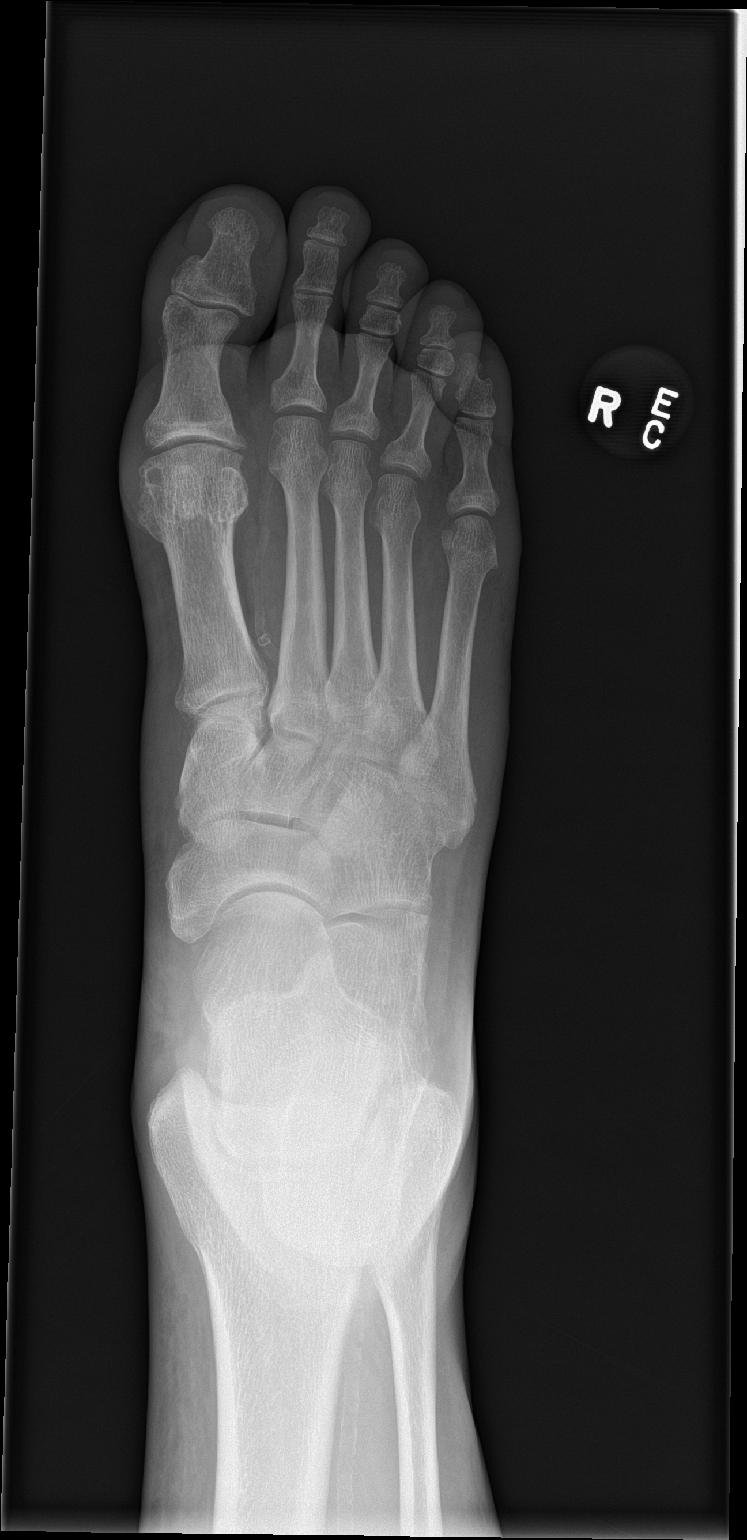

[foot obl]
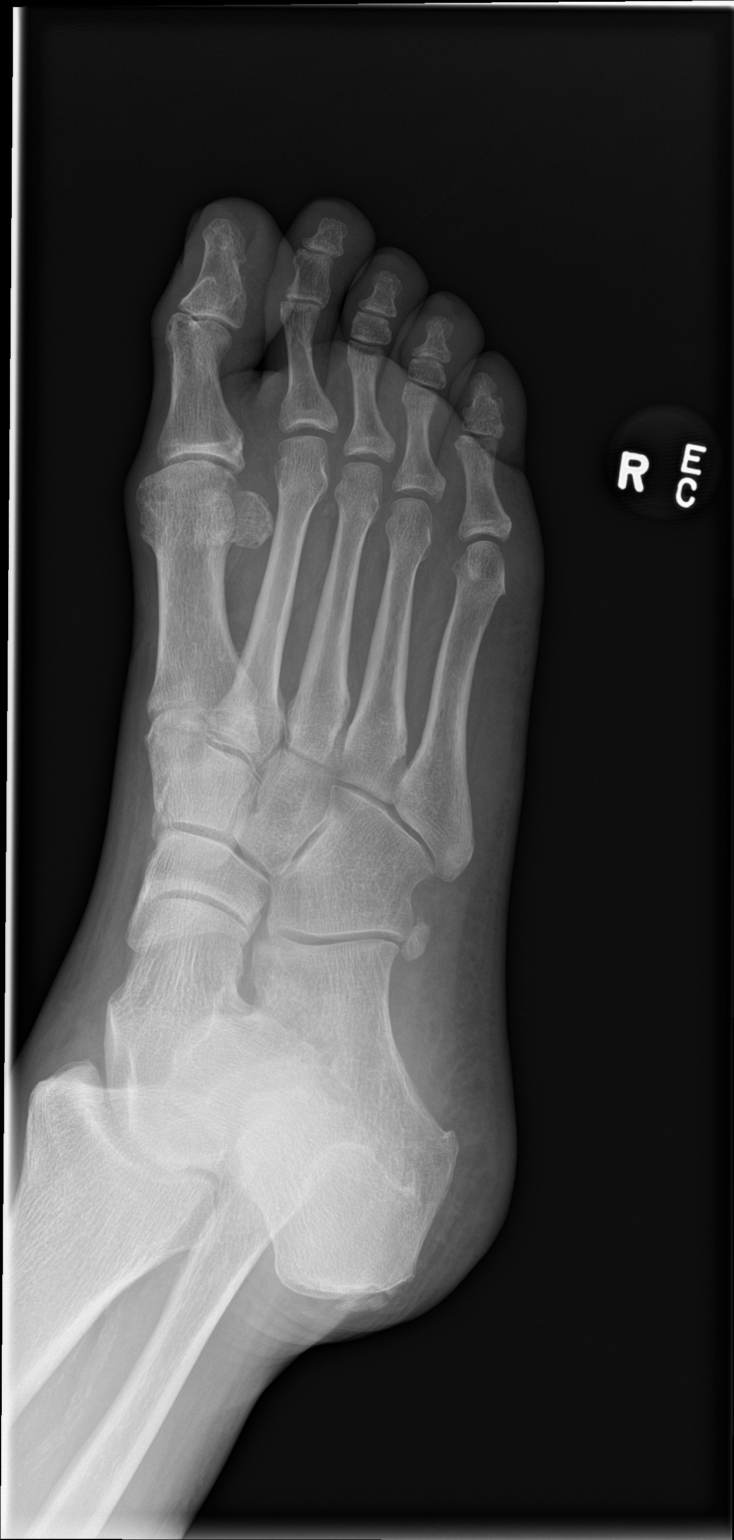

[foot lat]
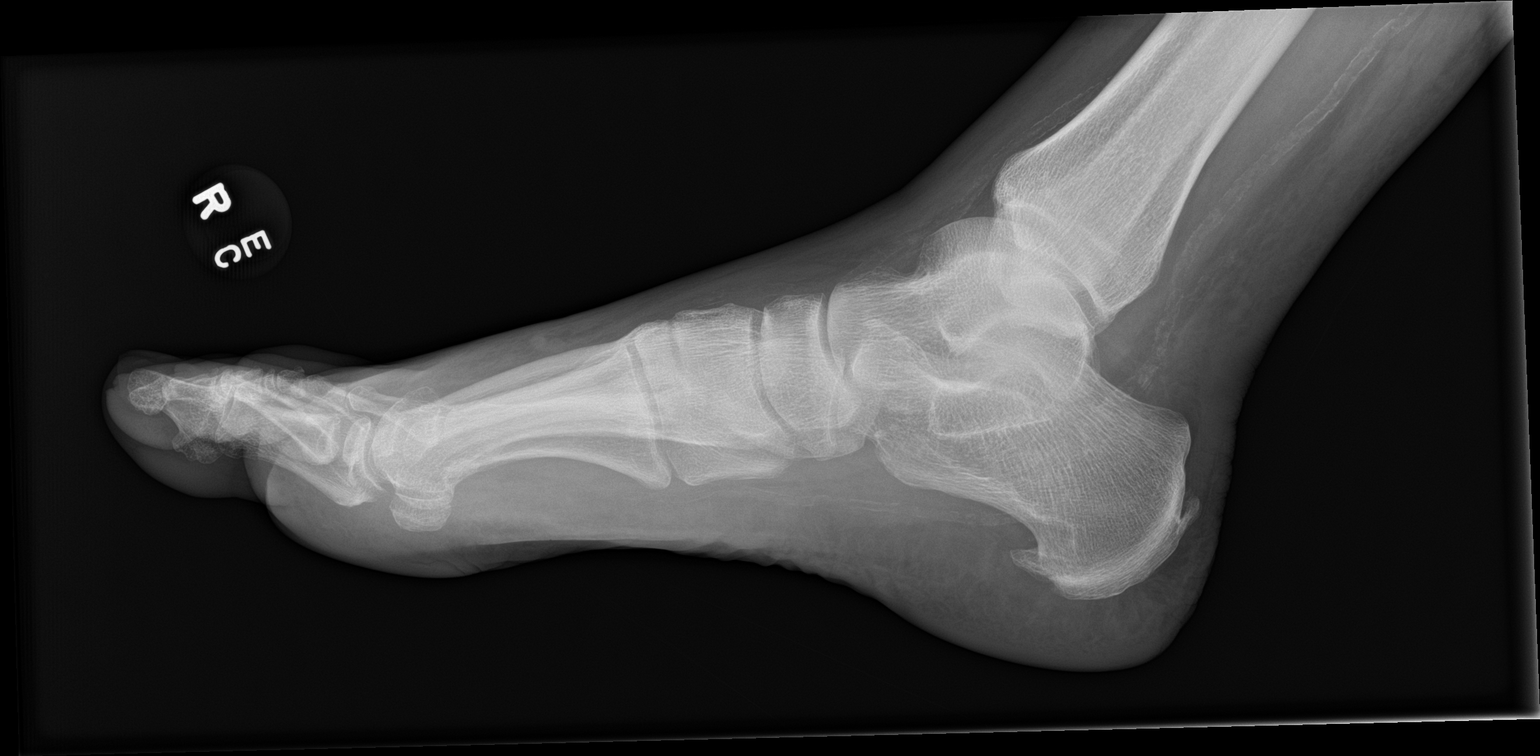

[3 of 3 positions shown; findings below may reference images not displayed]

FINDINGS: There is no evidence of fracture or dislocation. Mild osteoarthritis
of the first metatarsal phalangeal joint. No bony destructive
change, periosteal reaction, or abnormal bone density to suggest
osteomyelitis. Site of ulcer is not well seen radiographically. No
soft tissue air or radiopaque foreign body. Vascular calcifications
are seen.
IMPRESSION: No radiographic findings of osteomyelitis. Reported fourth digit
ulcer is not well seen radiographically.

## 2019-11-05 DIAGNOSIS — Z9682 Presence of neurostimulator: Principal | ICD-10-CM

## 2019-12-03 DIAGNOSIS — S199XXA Unspecified injury of neck, initial encounter: Principal | ICD-10-CM

## 2020-01-15 ENCOUNTER — Encounter: Admit: 2020-01-15 | Discharge: 2020-01-16 | Payer: PRIVATE HEALTH INSURANCE | Attending: Urology | Primary: Urology

## 2020-01-15 DIAGNOSIS — Z9682 Presence of neurostimulator: Principal | ICD-10-CM

## 2020-02-06 DIAGNOSIS — M542 Cervicalgia: Principal | ICD-10-CM

## 2020-02-07 DIAGNOSIS — Z9682 Presence of neurostimulator: Principal | ICD-10-CM

## 2020-02-19 DIAGNOSIS — B182 Chronic viral hepatitis C: Principal | ICD-10-CM

## 2020-03-02 DIAGNOSIS — Z9682 Presence of neurostimulator: Principal | ICD-10-CM

## 2020-03-03 ENCOUNTER — Encounter: Admit: 2020-03-03 | Discharge: 2020-03-03 | Payer: PRIVATE HEALTH INSURANCE

## 2020-03-03 MED ORDER — SULFAMETHOXAZOLE 800 MG-TRIMETHOPRIM 160 MG TABLET
ORAL_TABLET | Freq: Two times a day (BID) | ORAL | 0 refills | 5.00000 days | Status: CP
Start: 2020-03-03 — End: 2020-03-08

## 2020-03-03 MED ORDER — OXYCODONE 5 MG TABLET
ORAL_TABLET | ORAL | 0 refills | 2.00000 days | Status: CP | PRN
Start: 2020-03-03 — End: 2020-03-08

## 2020-03-05 ENCOUNTER — Encounter: Admit: 2020-03-05 | Discharge: 2020-03-06 | Payer: PRIVATE HEALTH INSURANCE

## 2020-03-05 DIAGNOSIS — M542 Cervicalgia: Principal | ICD-10-CM

## 2020-03-05 DIAGNOSIS — G8929 Other chronic pain: Principal | ICD-10-CM

## 2020-03-05 DIAGNOSIS — M79601 Pain in right arm: Principal | ICD-10-CM

## 2020-03-11 ENCOUNTER — Encounter: Admit: 2020-03-11 | Discharge: 2020-03-11 | Payer: PRIVATE HEALTH INSURANCE

## 2020-05-15 ENCOUNTER — Encounter: Admit: 2020-05-15 | Discharge: 2020-05-16 | Payer: PRIVATE HEALTH INSURANCE | Attending: Urology | Primary: Urology

## 2020-05-15 DIAGNOSIS — Z9682 Presence of neurostimulator: Principal | ICD-10-CM

## 2020-05-29 ENCOUNTER — Ambulatory Visit: Admit: 2020-05-29

## 2020-05-29 ENCOUNTER — Ambulatory Visit: Admit: 2020-05-29 | Attending: Urology | Primary: Urology

## 2020-06-02 ENCOUNTER — Other Ambulatory Visit: Payer: Self-pay

## 2020-06-02 ENCOUNTER — Encounter: Payer: Self-pay | Admitting: Adult Health

## 2020-06-02 ENCOUNTER — Ambulatory Visit (INDEPENDENT_AMBULATORY_CARE_PROVIDER_SITE_OTHER): Payer: PPO | Admitting: Adult Health

## 2020-06-02 VITALS — BP 130/76 | HR 97 | Temp 98.2°F | Ht 69.02 in | Wt 197.6 lb

## 2020-06-02 DIAGNOSIS — M79604 Pain in right leg: Secondary | ICD-10-CM

## 2020-06-02 DIAGNOSIS — F4310 Post-traumatic stress disorder, unspecified: Secondary | ICD-10-CM | POA: Insufficient documentation

## 2020-06-02 DIAGNOSIS — F419 Anxiety disorder, unspecified: Secondary | ICD-10-CM

## 2020-06-02 DIAGNOSIS — M25562 Pain in left knee: Secondary | ICD-10-CM

## 2020-06-02 DIAGNOSIS — R7989 Other specified abnormal findings of blood chemistry: Secondary | ICD-10-CM | POA: Diagnosis not present

## 2020-06-02 DIAGNOSIS — Z125 Encounter for screening for malignant neoplasm of prostate: Secondary | ICD-10-CM | POA: Diagnosis not present

## 2020-06-02 DIAGNOSIS — M25552 Pain in left hip: Secondary | ICD-10-CM

## 2020-06-02 DIAGNOSIS — E1169 Type 2 diabetes mellitus with other specified complication: Secondary | ICD-10-CM

## 2020-06-02 DIAGNOSIS — G8929 Other chronic pain: Secondary | ICD-10-CM

## 2020-06-02 DIAGNOSIS — M79605 Pain in left leg: Secondary | ICD-10-CM | POA: Diagnosis not present

## 2020-06-02 DIAGNOSIS — F411 Generalized anxiety disorder: Secondary | ICD-10-CM | POA: Insufficient documentation

## 2020-06-02 DIAGNOSIS — M25559 Pain in unspecified hip: Secondary | ICD-10-CM | POA: Insufficient documentation

## 2020-06-02 MED ORDER — HYDROXYZINE HCL 25 MG PO TABS
25.0000 mg | ORAL_TABLET | Freq: Three times a day (TID) | ORAL | 0 refills | Status: DC | PRN
Start: 2020-06-02 — End: 2020-09-29
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
## 2020-06-02 NOTE — Patient Instructions (Signed)
Hydroxyzine capsules or tablets What is this medicine? HYDROXYZINE (hye [place] i zeen) is an antihistamine. This medicine is used to treat allergy symptoms. It is also used to treat anxiety and tension. This medicine can be used with other medicines to induce sleep before surgery. This medicine may be used for other purposes; ask your health care provider or pharmacist if you have questions. COMMON BRAND NAME(S): ANX, Atarax, Rezine, Vistaril What should I tell my health care provider before I take this medicine? They need to know if you have any of these conditions:  glaucoma  heart disease  history of irregular heartbeat  kidney disease  liver disease  lung or breathing disease, like asthma  stomach or intestine problems  thyroid disease  trouble passing urine  an unusual or allergic reaction to hydroxyzine, cetirizine, other medicines, foods, dyes or preservatives  pregnant or trying to get pregnant  breast-feeding How should I use this medicine? Take this medicine by mouth with a full glass of water. Follow the directions on the prescription label. You may take this medicine with food or on an empty stomach. Take your medicine at regular intervals. Do not take your medicine more often than directed. Talk to your pediatrician regarding the use of this medicine in children. Special care may be needed. While this drug may be prescribed for children as young as 73 years of age for selected conditions, precautions do apply. Patients over 36 years old may have a stronger reaction and need a smaller dose. Overdosage: If you think you have taken too much of this medicine contact a poison control center or emergency room at once. NOTE: This medicine is only for you. Do not share this medicine with others. What if I miss a dose? If you miss a dose, take it as soon as you can. If it is almost time for your next dose, take only that dose. Do not take double or extra doses. What may  interact with this medicine? Do not take this medicine with any of the following medications:  cisapride  dronedarone  pimozide  thioridazine This medicine may also interact with the following medications:  alcohol  antihistamines for allergy, cough, and cold  atropine  barbiturate medicines for sleep or seizures, like phenobarbital  certain antibiotics like erythromycin or clarithromycin  certain medicines for anxiety or sleep  certain medicines for bladder problems like oxybutynin, tolterodine  certain medicines for depression or psychotic disturbances  certain medicines for irregular heart beat  certain medicines for Parkinson's disease like benztropine, trihexyphenidyl  certain medicines for seizures like phenobarbital, primidone  certain medicines for stomach problems like dicyclomine, hyoscyamine  certain medicines for travel sickness like scopolamine  ipratropium  narcotic medicines for pain  other medicines that prolong the QT interval (an abnormal heart rhythm) like dofetilide This list may not describe all possible interactions. Give your health care provider a list of all the medicines, herbs, non-prescription drugs, or dietary supplements you use. Also tell them if you smoke, drink alcohol, or use illegal drugs. Some items may interact with your medicine. What should I watch for while using this medicine? Tell your doctor or health care professional if your symptoms do not improve. You may get drowsy or dizzy. Do not drive, use machinery, or do anything that needs mental alertness until you know how this medicine affects you. Do not stand or sit up quickly, especially if you are an older patient. This reduces the risk of dizzy or fainting spells. Alcohol may  interfere with the effect of this medicine. Avoid alcoholic drinks. Your mouth may get dry. Chewing sugarless gum or sucking hard candy, and drinking plenty of water may help. Contact your doctor if the  problem does not go away or is severe. This medicine may cause dry eyes and blurred vision. If you wear contact lenses you may feel some discomfort. Lubricating drops may help. See your eye doctor if the problem does not go away or is severe. If you are receiving skin tests for allergies, tell your doctor you are using this medicine. What side effects may I notice from receiving this medicine? Side effects that you should report to your doctor or health care professional as soon as possible:  allergic reactions like skin rash, itching or hives, swelling of the face, lips, or tongue  changes in vision  confusion  fast, irregular heartbeat  seizures  tremor  trouble passing urine or change in the amount of urine Side effects that usually do not require medical attention (report to your doctor or health care professional if they continue or are bothersome):  constipation  drowsiness  dry mouth  headache  tiredness This list may not describe all possible side effects. Call your doctor for medical advice about side effects. You may report side effects to FDA at 1-800-FDA-1088. Where should I keep my medicine? Keep out of the reach of children. Store at room temperature between 15 and 30 degrees C (59 and 86 degrees F). Keep container tightly closed. Throw away any unused medicine after the expiration date. NOTE: This sheet is a summary. It may not cover all possible information. If you have questions about this medicine, talk to your doctor, pharmacist, or health care provider.  2021 Elsevier/Gold Standard (2017-12-18 13:19:55) http://NIMH.NIH.Gov">  Generalized Anxiety Disorder, Adult Generalized anxiety disorder (GAD) is a mental health condition. Unlike normal worries, anxiety related to GAD is not triggered by a specific event. These worries do not fade or get better with time. GAD interferes with relationships, work, and school. GAD symptoms can vary from mild to severe.  People with severe GAD can have intense waves of anxiety with physical symptoms that are similar to panic attacks. What are the causes? The exact cause of GAD is not known, but the following are believed to have an impact:  Differences in natural brain chemicals.  Genes passed down from parents to children.  Differences in the way threats are perceived.  Development during childhood.  Personality. What increases the risk? The following factors may make you more likely to develop this condition:  Being male.  Having a family history of anxiety disorders.  Being very shy.  Experiencing very stressful life events, such as the death of a loved one.  Having a very stressful family environment. What are the signs or symptoms? People with GAD often worry excessively about many things in their lives, such as their health and family. Symptoms may also include:  Mental and emotional symptoms: ? Worrying excessively about natural disasters. ? Fear of being late. ? Difficulty concentrating. ? Fears that others are judging your performance.  Physical symptoms: ? Fatigue. ? Headaches, muscle tension, muscle twitches, trembling, or feeling shaky. ? Feeling like your heart is pounding or beating very fast. ? Feeling out of breath or like you cannot take a deep breath. ? Having trouble falling asleep or staying asleep, or experiencing restlessness. ? Sweating. ? Nausea, diarrhea, or irritable bowel syndrome (IBS).  Behavioral symptoms: ? Experiencing erratic moods or irritability. ?  Avoidance of new situations. ? Avoidance of people. ? Extreme difficulty making decisions. How is this diagnosed? This condition is diagnosed based on your symptoms and medical history. You will also have a physical exam. Your health care provider may perform tests to rule out other possible causes of your symptoms. To be diagnosed with GAD, a person must have anxiety that:  Is out of his or her  control.  Affects several different aspects of his or her life, such as work and relationships.  Causes distress that makes him or her unable to take part in normal activities.  Includes at least three symptoms of GAD, such as restlessness, fatigue, trouble concentrating, irritability, muscle tension, or sleep problems. Before your health care provider can confirm a diagnosis of GAD, these symptoms must be present more days than they are not, and they must last for 6 months or longer. How is this treated? This condition may be treated with:  Medicine. Antidepressant medicine is usually prescribed for long-term daily control. Anti-anxiety medicines may be added in severe cases, especially when panic attacks occur.  Talk therapy (psychotherapy). Certain types of talk therapy can be helpful in treating GAD by providing support, education, and guidance. Options include: ? Cognitive behavioral therapy (CBT). People learn coping skills and self-calming techniques to ease their physical symptoms. They learn to identify unrealistic thoughts and behaviors and to replace them with more appropriate thoughts and behaviors. ? Acceptance and commitment therapy (ACT). This treatment teaches people how to be mindful as a way to cope with unwanted thoughts and feelings. ? Biofeedback. This process trains you to manage your body's response (physiological response) through breathing techniques and relaxation methods. You will work with a therapist while machines are used to monitor your physical symptoms.  Stress management techniques. These include yoga, meditation, and exercise. A mental health specialist can help determine which treatment is best for you. Some people see improvement with one type of therapy. However, other people require a combination of therapies.   Follow these instructions at home: Lifestyle  Maintain a consistent routine and schedule.  Anticipate stressful situations. Create a plan, and  allow extra time to work with your plan.  Practice stress management or self-calming techniques that you have learned from your therapist or your health care provider. General instructions  Take over-the-counter and prescription medicines only as told by your health care provider.  Understand that you are likely to have setbacks. Accept this and be kind to yourself as you persist to take better care of yourself.  Recognize and accept your accomplishments, even if you judge them as small.  Keep all follow-up visits as told by your health care provider. This is important. Contact a health care provider if:  Your symptoms do not get better.  Your symptoms get worse.  You have signs of depression, such as: ? A persistently sad or irritable mood. ? Loss of enjoyment in activities that used to bring you joy. ? Change in weight or eating. ? Changes in sleeping habits. ? Avoiding friends or family members. ? Loss of energy for normal tasks. ? Feelings of guilt or worthlessness. Get help right away if:  You have serious thoughts about hurting yourself or others. If you ever feel like you may hurt yourself or others, or have thoughts about taking your own life, get help right away. Go to your nearest emergency department or:  Call your local emergency services (911 in the [place]  Call a suicide crisis helpline, such as  the National Suicide Prevention Lifeline at 928-029-8293. This is open 24 hours a day in the [place]  Text the Crisis Text Line at (867)305-4273 (in the [place].). Summary  Generalized anxiety disorder (GAD) is a mental health condition that involves worry that is not triggered by a specific event.  People with GAD often worry excessively about many things in their lives, such as their health and family.  GAD may cause symptoms such as restlessness, trouble concentrating, sleep problems, frequent sweating, nausea, diarrhea, headaches, and trembling or muscle twitching.  A mental  health specialist can help determine which treatment is best for you. Some people see improvement with one type of therapy. However, other people require a combination of therapies. This information is not intended to replace advice given to you by your health care provider. Make sure you discuss any questions you have with your health care provider. Document Revised: 10/17/2018 Document Reviewed: 10/17/2018 Elsevier Patient Education  [place].

## 2020-06-03 LAB — URINE DRUGS OF ABUSE SCREEN W ALC, ROUTINE (REF LAB)
Amphetamines, Urine: NEGATIVE ng/mL
Barbiturate Quant, Ur: NEGATIVE ng/mL
Benzodiazepine Quant, Ur: NEGATIVE ng/mL
Cannabinoid Quant, Ur: NEGATIVE ng/mL
Cocaine (Metab.): NEGATIVE ng/mL
Ethanol, Urine: NEGATIVE %
Methadone Screen, Urine: NEGATIVE ng/mL
Opiate Quant, Ur: NEGATIVE ng/mL
PCP Quant, Ur: NEGATIVE ng/mL
Propoxyphene: NEGATIVE ng/mL

## 2020-06-03 NOTE — Progress Notes (Signed)
Urine drug screen negative.

## 2020-06-05 ENCOUNTER — Telehealth: Payer: Self-pay | Admitting: Adult Health

## 2020-06-05 NOTE — Telephone Encounter (Signed)
Noted. Erik Martinez can we find out where else patient would like to be referred to for orthopedics ? [facility] ?

## 2020-06-05 NOTE — Telephone Encounter (Signed)
Rejection Reason - Other - Pt has been dismissed from [facility] as of 2019" Grayland Jack said on Jun 03, 2020 2:34 PM  msg from [facility] orthopedic surgery

## 2020-06-05 NOTE — Telephone Encounter (Signed)
See previous note

## 2020-06-05 NOTE — Telephone Encounter (Signed)
Called patient and number was not in service . Called patients wife to get in touch with patient. Santiago Glad gave me patients new number [phone] which is temporary. Left message to call back.

## 2020-06-09 NOTE — Telephone Encounter (Signed)
Patient called back. He refused to see [facility] so Sharyn Lull will put in a general referral.

## 2020-06-11 ENCOUNTER — Telehealth: Payer: Self-pay | Admitting: Adult Health

## 2020-06-11 NOTE — Telephone Encounter (Signed)
Pt would like a call back to discuss his medications

## 2020-06-12 NOTE — Telephone Encounter (Signed)
Called this pt about his medications. Pt is asking when his medications will be refilled. Pt states he has 3 days worth of Gabapentin left and pt stated provider mentioned some Lyrica. Pt also asked could he get some something for his knees like lidocaine patches. Pt also mentioned Ambien. We previously spoke about this patient he called back and I wanted to inform you of the phone call and pt concern.

## 2020-06-12 NOTE — Telephone Encounter (Signed)
Patient states he wants to start the Lyrica.

## 2020-06-12 NOTE — Telephone Encounter (Signed)
Called patient to ask provider question. Pt has no voicemail box . No Vm left.

## 2020-06-12 NOTE — Telephone Encounter (Signed)
Would he prefer to try Lyrica versus gabapentin ?

## 2020-06-12 NOTE — Telephone Encounter (Signed)
Pt called back. Pt states he was taking both Lyrica and Gabapentin before prison. He states they wouldn't allow him Lyrica in prsion. He states he was taking Lyrica 300 mg three times a day and Gabapentin 2 300 mg tablets three times a day.

## 2020-06-15 ENCOUNTER — Other Ambulatory Visit: Payer: Self-pay | Admitting: Adult Health

## 2020-06-15 ENCOUNTER — Telehealth: Payer: Self-pay

## 2020-06-15 MED ORDER — LIDOCAINE 5 % EX PTCH: 1.0000 | MEDICATED_PATCH | CUTANEOUS | 0 refills | Status: DC

## 2020-06-15 MED ORDER — ZOLPIDEM TARTRATE 5 MG PO TABS: 5.0000 mg | ORAL_TABLET | Freq: Every evening | ORAL | 0 refills | Status: DC | PRN

## 2020-06-15 MED ORDER — PREGABALIN 75 MG PO CAPS: 75.0000 mg | ORAL_CAPSULE | Freq: Two times a day (BID) | ORAL | 0 refills | Status: DC

## 2020-06-15 NOTE — Telephone Encounter (Signed)
Patient called in about his medications. He's currently out of his gabapentin and I discussed with Sharyn Lull on 6/3 that patient wanted to switch to Lyrica. Pt was seen on 5/24 and called back 6/2 was advised that we could continue gabapentin or switch to Lyrica.

## 2020-06-15 NOTE — Telephone Encounter (Signed)
Pt called this morning about medications. I just routed you the telephone call. Thank you,  I will be calling him back to let him know.

## 2020-06-15 NOTE — Telephone Encounter (Signed)
Patient's insurance does not cover  lidocaine (LIDODERM) 5 % patches , would like something else.

## 2020-06-15 NOTE — Telephone Encounter (Signed)
Meds ordered this encounter  Medications   pregabalin (LYRICA) 75 MG capsule    Sig: Take 1 capsule (75 mg total) by mouth 2 (two) times daily.    Dispense:  60 capsule    Refill:  0   zolpidem (AMBIEN) 5 MG tablet    Sig: Take 1 tablet (5 mg total) by mouth at bedtime as needed for sleep.    Dispense:  20 tablet    Refill:  0   lidocaine (LIDODERM) 5 %    Sig: Place 1 patch onto the skin daily. Remove & Discard patch within 12 hours or as directed by MD    Dispense:  30 patch    Refill:  0  Discontinued Gabapentin. Sent medications as above, we have room to increase :Lyrica if needed but need to start at this dose.

## 2020-06-15 NOTE — Telephone Encounter (Signed)
See duplicate message his medications have been sent.

## 2020-06-15 NOTE — Telephone Encounter (Signed)
Pt stated hydroxyzine is making him feel light headed and wanted to see if he could try something else?

## 2020-06-15 NOTE — Telephone Encounter (Signed)
Tried calling pt about provider message. Pt has no voicemail box. No VM left.

## 2020-06-15 NOTE — Progress Notes (Signed)
Meds ordered this encounter  Medications  . pregabalin (LYRICA) 75 MG capsule    Sig: Take 1 capsule (75 mg total) by mouth 2 (two) times daily.    Dispense:  60 capsule    Refill:  0  . zolpidem (AMBIEN) 5 MG tablet    Sig: Take 1 tablet (5 mg total) by mouth at bedtime as needed for sleep.    Dispense:  20 tablet    Refill:  0  . lidocaine (LIDODERM) 5 %    Sig: Place 1 patch onto the skin daily. Remove & Discard patch within 12 hours or as directed by MD    Dispense:  30 patch    Refill:  0    Medications Discontinued During This Encounter  Medication Reason  . gabapentin (NEURONTIN) 600 MG tablet Completed Course  . zolpidem (AMBIEN) 10 MG tablet Completed Course  . pregabalin (LYRICA) 200 MG capsule

## 2020-06-15 NOTE — Telephone Encounter (Signed)
Would recommend stopping Hydroxyzine or trying half dosage. I rarely prescribe benzodiazepines and do not recommend. We cpuld try an antidepressant but this would need follow up visit.

## 2020-06-16 ENCOUNTER — Other Ambulatory Visit: Payer: Self-pay | Admitting: Adult Health

## 2020-06-16 DIAGNOSIS — R7989 Other specified abnormal findings of blood chemistry: Secondary | ICD-10-CM

## 2020-06-16 MED ORDER — DICLOFENAC SODIUM 1 % EX GEL: 2.0000 g | Freq: Four times a day (QID) | CUTANEOUS | 0 refills | Status: DC

## 2020-06-16 NOTE — Telephone Encounter (Signed)
Meds ordered this encounter  Medications   diclofenac Sodium (VOLTAREN) 1 % GEL    Sig: Apply 2 g topically 4 (four) times daily. To area of pain, not for face or genitals.    Dispense:  150 g    Refill:  0   Discontinued the lidocaine patches, please have him try the above and let us know if not helping was he able to get in with orthopedics as referred- if he is not please follow up with referrals on status.  I did place referral for hormone evaluation per patient request this morning as well. He should hear within 1- 2 weeks and if he does not should notify us.

## 2020-06-16 NOTE — Progress Notes (Signed)
Orders Placed This Encounter  Procedures  . Ambulatory referral to Endocrinology    Referral Priority:   Routine    Referral Type:   Consultation    Referral Reason:   Specialty Services Required    Number of Visits Requested:   1  Per Waymon Budge RN - Patient request Dr. Ronnald Collum for endocrinology for hormone replacement. The referral has to say Hormone Replacement fax# [phone] Patient has a reported history of low testosterone in past. will have endocrinology to evaluate and treat as seen fit.

## 2020-06-16 NOTE — Progress Notes (Signed)
Meds ordered this encounter  Medications  . diclofenac Sodium (VOLTAREN) 1 % GEL    Sig: Apply 2 g topically 4 (four) times daily. To area of pain, not for face or genitals.    Dispense:  150 g    Refill:  0   Below not covered by insurance.  Medications Discontinued During This Encounter  Medication Reason  . lidocaine (LIDODERM) 5 % Prescription never filled

## 2020-06-17 ENCOUNTER — Other Ambulatory Visit: Payer: Self-pay | Admitting: Adult Health

## 2020-06-17 MED ORDER — EMPAGLIFLOZIN 25 MG PO TABS: 25.0000 mg | ORAL_TABLET | Freq: Every day | ORAL | 1 refills | Status: DC

## 2020-06-17 NOTE — Telephone Encounter (Signed)
Meds ordered this encounter  Medications   empagliflozin (JARDIANCE) 25 MG TABS tablet    Sig: Take 1 tablet (25 mg total) by mouth daily.    Dispense:  90 tablet    Refill:  1   Discontinued metformin, start on Jardiance as above and recheck A1C and CMP in 3 months.   Unfortunately it does it appear the patches are covered other than the lidocaine 5 % patch  and it shows a 40 dollar co pay if his pharmacy has an alternative I will be happy to consider if he will just let us know.

## 2020-06-17 NOTE — Progress Notes (Signed)
Meds ordered this encounter  Medications  . empagliflozin (JARDIANCE) 25 MG TABS tablet    Sig: Take 1 tablet (25 mg total) by mouth daily.    Dispense:  90 tablet    Refill:  1    Medications Discontinued During This Encounter  Medication Reason  . metFORMIN (GLUCOPHAGE) 1000 MG tablet Completed Course

## 2020-06-17 NOTE — Telephone Encounter (Signed)
Called and spoke to [place]. Artemis verbalized understanding and states that he will schedule that repeat blood draw at his next appointment. He asks if it is possible to receive a written prescription for his Lyrica since he goes to [facility] and uses [facility] to bring his copay down from 40 to 12 dollars. He states that he will discuss this at his next appointment and he will let us know if he finds any diclofenac patch alternatives.

## 2020-06-17 NOTE — Telephone Encounter (Signed)
Called and spoke to patient. 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 He states that he had asked for the Diclofenac Patch because that will help treat his back and knee pain. Markeith states that he does not want to go to a pain clinic nor be on any opiates. He asks if he will be put on Jardiance like discussed at his last office visit. Donevin says that he is almost out of his 30 days of Metformin.

## 2020-06-17 NOTE — Telephone Encounter (Signed)
I can e prescribe to [facility] Lyrica and note to pharmacy to apply [facility]  for him if he would like.  Noted on patches.

## 2020-06-18 NOTE — Telephone Encounter (Signed)
Pt states his dose of Lyrica is too low compared to what he previously took. Please advise.

## 2020-06-18 NOTE — Telephone Encounter (Signed)
Pt states hes stopped taking the hydroxyzine.

## 2020-06-19 DIAGNOSIS — Z1389 Encounter for screening for other disorder: Secondary | ICD-10-CM | POA: Diagnosis not present

## 2020-06-19 DIAGNOSIS — Z96652 Presence of left artificial knee joint: Secondary | ICD-10-CM | POA: Diagnosis not present

## 2020-06-19 DIAGNOSIS — M25512 Pain in left shoulder: Secondary | ICD-10-CM | POA: Diagnosis not present

## 2020-06-19 DIAGNOSIS — Z125 Encounter for screening for malignant neoplasm of prostate: Secondary | ICD-10-CM | POA: Diagnosis not present

## 2020-06-19 DIAGNOSIS — M4642 Discitis, unspecified, cervical region: Secondary | ICD-10-CM | POA: Diagnosis not present

## 2020-06-19 DIAGNOSIS — Z7189 Other specified counseling: Secondary | ICD-10-CM | POA: Diagnosis not present

## 2020-06-19 DIAGNOSIS — E1140 Type 2 diabetes mellitus with diabetic neuropathy, unspecified: Secondary | ICD-10-CM | POA: Diagnosis not present

## 2020-06-19 DIAGNOSIS — F5221 Male erectile disorder: Secondary | ICD-10-CM | POA: Diagnosis not present

## 2020-06-19 DIAGNOSIS — M545 Low back pain, unspecified: Secondary | ICD-10-CM | POA: Diagnosis not present

## 2020-06-19 DIAGNOSIS — S46212A Strain of muscle, fascia and tendon of other parts of biceps, left arm, initial encounter: Secondary | ICD-10-CM | POA: Diagnosis not present

## 2020-06-19 DIAGNOSIS — Z8619 Personal history of other infectious and parasitic diseases: Secondary | ICD-10-CM | POA: Diagnosis not present

## 2020-06-19 DIAGNOSIS — Z1321 Encounter for screening for nutritional disorder: Secondary | ICD-10-CM | POA: Diagnosis not present

## 2020-06-19 LAB — CBC: RBC: 4.47 (ref 3.87–5.11)

## 2020-06-19 LAB — COMPREHENSIVE METABOLIC PANEL
Albumin: 4.3 (ref 3.5–5.0)
Calcium: 9.5 (ref 8.7–10.7)
Globulin: 2.9

## 2020-06-19 LAB — HEMOGLOBIN A1C: Hemoglobin A1C: 8.4

## 2020-06-19 LAB — BASIC METABOLIC PANEL
BUN: 27 — AB (ref 4–21)
CO2: 24 — AB (ref 13–22)
Chloride: 95 — AB (ref 99–108)
Glucose: 228
Potassium: 4.7 (ref 3.4–5.3)
Sodium: 139 (ref 137–147)

## 2020-06-19 LAB — HEPATIC FUNCTION PANEL
ALT: 19 (ref 10–40)
AST: 16 (ref 14–40)
Alkaline Phosphatase: 68 (ref 25–125)
Bilirubin, Total: 0.3

## 2020-06-19 LAB — LIPID PANEL
Cholesterol: 310 — AB (ref 0–200)
HDL: 49 (ref 35–70)
LDL Cholesterol: 196
LDl/HDL Ratio: 4
Triglycerides: 323 — AB (ref 40–160)

## 2020-06-19 LAB — TSH: TSH: 1.58 (ref <=5.90)

## 2020-06-19 LAB — PSA: PSA: 0.4

## 2020-06-19 LAB — CBC AND DIFFERENTIAL
HCT: 40 — AB (ref 41–53)
Hemoglobin: 13.4 — AB (ref 13.5–17.5)
Platelets: 183 (ref 150–399)
WBC: 5

## 2020-06-19 NOTE — Telephone Encounter (Signed)
Called pt to see if he'd like follow up to discuss medications with provider. Pt has no VM . No vm left

## 2020-06-19 NOTE — Telephone Encounter (Signed)
See other telephone call

## 2020-06-22 DIAGNOSIS — K429 Umbilical hernia without obstruction or gangrene: Secondary | ICD-10-CM | POA: Diagnosis not present

## 2020-06-22 DIAGNOSIS — E291 Testicular hypofunction: Secondary | ICD-10-CM | POA: Diagnosis not present

## 2020-06-22 DIAGNOSIS — E6609 Other obesity due to excess calories: Secondary | ICD-10-CM | POA: Diagnosis not present

## 2020-06-22 DIAGNOSIS — E1165 Type 2 diabetes mellitus with hyperglycemia: Secondary | ICD-10-CM | POA: Diagnosis not present

## 2020-06-24 NOTE — Telephone Encounter (Signed)
Patient scheduled 6/27 & stated that he will be here for appointment.

## 2020-06-24 NOTE — Telephone Encounter (Signed)
I tried to call patient did not answer & no VM set up.

## 2020-07-02 DIAGNOSIS — M4642 Discitis, unspecified, cervical region: Principal | ICD-10-CM

## 2020-07-06 ENCOUNTER — Ambulatory Visit: Payer: PPO | Admitting: Adult Health

## 2020-07-09 ENCOUNTER — Other Ambulatory Visit: Payer: Self-pay

## 2020-07-09 ENCOUNTER — Telehealth (INDEPENDENT_AMBULATORY_CARE_PROVIDER_SITE_OTHER): Payer: PPO | Admitting: Family Medicine

## 2020-07-09 DIAGNOSIS — K429 Umbilical hernia without obstruction or gangrene: Secondary | ICD-10-CM | POA: Diagnosis not present

## 2020-07-09 DIAGNOSIS — M79604 Pain in right leg: Secondary | ICD-10-CM | POA: Diagnosis not present

## 2020-07-09 DIAGNOSIS — E1142 Type 2 diabetes mellitus with diabetic polyneuropathy: Secondary | ICD-10-CM | POA: Diagnosis not present

## 2020-07-09 DIAGNOSIS — G4700 Insomnia, unspecified: Secondary | ICD-10-CM

## 2020-07-09 DIAGNOSIS — E1165 Type 2 diabetes mellitus with hyperglycemia: Secondary | ICD-10-CM | POA: Diagnosis not present

## 2020-07-09 DIAGNOSIS — G629 Polyneuropathy, unspecified: Secondary | ICD-10-CM | POA: Diagnosis not present

## 2020-07-09 DIAGNOSIS — N529 Male erectile dysfunction, unspecified: Secondary | ICD-10-CM | POA: Diagnosis not present

## 2020-07-09 DIAGNOSIS — E6609 Other obesity due to excess calories: Secondary | ICD-10-CM | POA: Diagnosis not present

## 2020-07-09 DIAGNOSIS — E7800 Pure hypercholesterolemia, unspecified: Secondary | ICD-10-CM | POA: Diagnosis not present

## 2020-07-09 DIAGNOSIS — M79605 Pain in left leg: Secondary | ICD-10-CM

## 2020-07-09 DIAGNOSIS — F5104 Psychophysiologic insomnia: Secondary | ICD-10-CM | POA: Insufficient documentation

## 2020-07-09 DIAGNOSIS — E291 Testicular hypofunction: Secondary | ICD-10-CM | POA: Diagnosis not present

## 2020-07-09 MED ORDER — PREGABALIN 75 MG PO CAPS: 75.0000 mg | ORAL_CAPSULE | Freq: Three times a day (TID) | ORAL | 0 refills | Status: DC

## 2020-07-09 MED ORDER — DICLOFENAC SODIUM 75 MG PO TBEC: 75.0000 mg | DELAYED_RELEASE_TABLET | Freq: Two times a day (BID) | ORAL | 0 refills | Status: DC

## 2020-07-09 MED ORDER — OMEPRAZOLE 20 MG PO CPDR: 20.0000 mg | DELAYED_RELEASE_CAPSULE | Freq: Every day | ORAL | 3 refills | Status: DC

## 2020-07-09 NOTE — Assessment & Plan Note (Signed)
Sounds as though his pain is playing quite a role with this.  Discussed that I would not want to increase Ambien at the same time that we are increasing the Lyrica.  Discussed if he tolerates the Lyrica increase and he is not sleeping better I would consider increasing his Ambien in the future.

## 2020-07-09 NOTE — Assessment & Plan Note (Signed)
Patient has chronic joint pain from arthritis as well as prior knee replacements.  It appears that he has adequate kidney function to take diclofenac 75 mg twice daily.  This was prescribed.  We will start him on omeprazole as well for gastric protection.  Discussed risk to his kidneys as well as stomach lining with chronic use of diclofenac.  If he requires this on a daily basis he will let us know and we can recheck his kidney function.

## 2020-07-09 NOTE — Progress Notes (Signed)
Virtual Visit via telephone Note  This visit type was conducted due to national recommendations for restrictions regarding the COVID-19 pandemic (e.g. social distancing).  This format is felt to be most appropriate for this patient at this time.  All issues noted in this document were discussed and addressed.  No physical exam was performed (except for noted visual exam findings with Video Visits).   I connected with Erik Martinez today at  3:30 PM EDT by telephone verified that I am speaking with the correct person using two identifiers. Location patient: car Location provider: work  Persons participating in the virtual visit: patient, provider  I discussed the limitations, risks, security and privacy concerns of performing an evaluation and management service by telephone and the availability of in person appointments. I also discussed with the patient that there may be a patient responsible charge related to this service. The patient expressed understanding and agreed to proceed.  Interactive audio and video telecommunications were attempted between this provider and patient, however failed, due to patient having technical difficulties OR patient did not have access to video capability.  We continued and completed visit with audio only.   Reason for visit: Follow-up.  HPI: Diabetes: Patient notes that his sugars have been 100-225.  He just started the Jardiance 2 days ago.  Notes occasional polydipsia.  No hypoglycemia.  He reports his endocrinologist checked his A1c recently and it was 8.2.  Neuropathy: Patient notes the Lyrica 75 mg twice daily as not been beneficial.  He notes his feet hurt all the time.  They worsen throughout the day as he is on them more.  He used to be on Lyrica 150 mg twice daily and gabapentin.  He notes that the gabapentin did not help very much.  He notes no drowsiness with his current or prior dose of Lyrica.  He notes numbness and feeling as though his feet are  on fire.  Chronic joint pain: Patient notes having knee replacements as well as chronic hip pain and scattered other joint pains.  He was on opiates in the past and notes those ruined his life.  He has lots of inflammation.  He takes ibuprofen 3 times a day and initially stated he takes up to 2400 mg a day though then stated he takes 2 tablets 3 times daily.  He wonders about oral diclofenac.  He reports his recent creatinine was 1.9 and GFR was 69.  Of note he had the lab work in front of him.  Sleep difficulty: A lot of this is driven by his neuropathy.  He reports being on Ambien 10 mg once daily prior to going to prison.  He notes recently he was started back on 5 mg of Ambien nightly and notes it has not helped very much.  He was previously prescribed hydroxyzine and notes that made him tired during the day.   ROS: See pertinent positives and negatives per HPI.  Past Medical History:  Diagnosis Date   Anxiety    Arthritis    Avascular necrosis of bones of both hips (HCC)    Bipolar disorder (HCC)    Bladder spasms    Chronic pain of left knee    Chronic pain syndrome    PAIN CLINIC   DDD (degenerative disc disease)    SPINE   Depression    Diabetes mellitus without complication (HCC)    Frequency-urgency syndrome    GERD (gastroesophageal reflux disease)    Hepatitis    Hepatitis  History of hepatitis B    1983  TX'D--  NO ISSUES OR SYMPTOMS SINCE   Hypotestosteronemia    Nocturia    PONV (postoperative nausea and vomiting)    "difficult to put me to sleep"   Spinal headache    Urine incontinence     Past Surgical History:  Procedure Laterality Date   BACK SURGERY     2 rods and 4 screws artificial disc   INTERSTIM IMPLANT PLACEMENT  2007   INTERSTIM IMPLANT REVISION N/A 08/17/2012   Procedure: REPLACMENT OF IPG PLUS REPLACE LEAD OF INTERSTIM IMPLANT ;  Surgeon: [name], MD;  Location: [facility];  Service: Urology;  Laterality: N/A;   JOINT  REPLACEMENT     Lt knee and Lt hip   LUMBAR DISC SURGERY  2005   L5   LUMBAR FUSION  X2  2006  &  2007   OPEN DEBRIDEMENT LEFT TOTAL KNEE (SCAR, BONEY GRAOWTH)/ REMOVAL OLD SUTURES  05-01-2007   PARTIAL HIP ARTHROPLASTY Left 2001   x2   SHOULDER OPEN ROTATOR CUFF REPAIR Left 2000   TOTAL HIP ARTHROPLASTY Left 2002   TOTAL KNEE ARTHROPLASTY Left    PARTIAL LEFT KNEE REPLACEMENT PRIOR TO THIS   TOTAL KNEE REVISION Left 2008   TRIGGER FINGER RELEASE Bilateral    SEVERAL FINGERS   TRIGGER FINGER RELEASE Right 06/18/2014   Procedure: RELEASE TRIGGER FINGER/A-1 PULLEY;  Surgeon: [name], MD;  Location: [facility];  Service: Orthopedics;  Laterality: Right;    Family History  Problem Relation Age of Onset   Diabetes Mother    Arthritis Mother    Cancer Mother    Hyperlipidemia Mother    Stroke Mother    Arthritis Father    Cancer Father     SOCIAL HX: Non-smoker   Current Outpatient Medications:    atorvastatin (LIPITOR) 40 MG tablet, Take 40 mg by mouth daily., Disp: , Rfl:    diclofenac (VOLTAREN) 75 MG EC tablet, Take 1 tablet (75 mg total) by mouth 2 (two) times daily., Disp: 60 tablet, Rfl: 0   empagliflozin (JARDIANCE) 25 MG TABS tablet, Take 1 tablet (25 mg total) by mouth daily., Disp: 90 tablet, Rfl: 1   hydrOXYzine (ATARAX/VISTARIL) 25 MG tablet, Take 1 tablet (25 mg total) by mouth every 8 (eight) hours as needed for anxiety., Disp: 30 tablet, Rfl: 0   Multiple Vitamins-Minerals (PX COMPLETE SENIOR MULTIVITS) TABS, Take by mouth., Disp: , Rfl:    omeprazole (PRILOSEC) 20 MG capsule, Take 1 capsule (20 mg total) by mouth daily., Disp: 30 capsule, Rfl: 3   zolpidem (AMBIEN) 5 MG tablet, Take 1 tablet (5 mg total) by mouth at bedtime as needed for sleep., Disp: 20 tablet, Rfl: 0   pregabalin (LYRICA) 75 MG capsule, Take 1 capsule (75 mg total) by mouth 3 (three) times daily., Disp: 90 capsule, Rfl: 0  EXAM: This was a telephone visit and thus no physical exam was  completed.  ASSESSMENT AND PLAN:  Discussed the following assessment and plan:  Problem List Items Addressed This Visit     Diabetes ([place])    Most recent A1c from a couple weeks ago at an outside provider was uncontrolled.  He just recently started [place].  Counseled on monitoring for UTI symptoms with this medication.  Discussed he may have increased urination with this as well.       Insomnia    Sounds as though his pain is playing quite a  role with this.  Discussed that I would not want to increase Ambien at the same time that we are increasing the Lyrica.  Discussed if he tolerates the Lyrica increase and he is not sleeping better I would consider increasing his Ambien in the future.       Neuropathy    Uncontrolled.  We will increase Lyrica to 75 mg 3 times daily.  If this is not beneficial he can increase the dose on his own after 5 to 7 days to 150 mg twice daily.  Discussed monitoring for drowsiness with this.  If he does get drowsy he will let us know.       Pain in both lower extremities    Patient has chronic joint pain from arthritis as well as prior knee replacements.  It appears that he has adequate kidney function to take diclofenac 75 mg twice daily.  This was prescribed.  We will start him on omeprazole as well for gastric protection.  Discussed risk to his kidneys as well as stomach lining with chronic use of diclofenac.  If he requires this on a daily basis he will let us know and we can recheck his kidney function.        Return in about 6 weeks (around 08/20/2020) for with PCP.   I discussed the assessment and treatment plan with the patient. The patient was provided an opportunity to ask questions and all were answered. The patient agreed with the plan and demonstrated an understanding of the instructions.   The patient was advised to call back or seek an in-person evaluation if the symptoms worsen or if the condition fails to improve as anticipated.  I  provided 18 minutes of non-face-to-face time during this encounter.   Tommi Rumps, MD

## 2020-07-09 NOTE — Assessment & Plan Note (Signed)
Most recent A1c from a couple weeks ago at an outside provider was uncontrolled.  He just recently started [place].  Counseled on monitoring for UTI symptoms with this medication.  Discussed he may have increased urination with this as well.

## 2020-07-09 NOTE — Assessment & Plan Note (Signed)
Uncontrolled.  We will increase Lyrica to 75 mg 3 times daily.  If this is not beneficial he can increase the dose on his own after 5 to 7 days to 150 mg twice daily.  Discussed monitoring for drowsiness with this.  If he does get drowsy he will let us know.

## 2020-07-10 ENCOUNTER — Telehealth: Payer: Self-pay | Admitting: Adult Health

## 2020-07-10 NOTE — Telephone Encounter (Signed)
Patient called and said his medication was never called on 07/09/20. It looks like one was but not the other. Please resend ; pregabalin (LYRICA) 75 MG capsule and   zolpidem (AMBIEN) 5 MG tablet

## 2020-07-13 ENCOUNTER — Other Ambulatory Visit: Payer: Self-pay | Admitting: Adult Health

## 2020-07-14 MED ORDER — ZOLPIDEM TARTRATE 5 MG PO TABS: 5.0000 mg | ORAL_TABLET | Freq: Every evening | ORAL | 1 refills | Status: DC | PRN

## 2020-07-14 NOTE — Addendum Note (Signed)
Addended by: Dutch Quint B on: 07/14/2020 10:08 AM   Modules accepted: Orders

## 2020-07-16 DIAGNOSIS — Z1389 Encounter for screening for other disorder: Secondary | ICD-10-CM | POA: Diagnosis not present

## 2020-07-16 DIAGNOSIS — F4322 Adjustment disorder with anxiety: Secondary | ICD-10-CM | POA: Diagnosis not present

## 2020-07-23 ENCOUNTER — Telehealth: Payer: Self-pay | Admitting: Adult Health

## 2020-07-23 DIAGNOSIS — H524 Presbyopia: Secondary | ICD-10-CM | POA: Diagnosis not present

## 2020-07-23 DIAGNOSIS — H5203 Hypermetropia, bilateral: Secondary | ICD-10-CM | POA: Diagnosis not present

## 2020-07-23 DIAGNOSIS — H52223 Regular astigmatism, bilateral: Secondary | ICD-10-CM | POA: Diagnosis not present

## 2020-07-23 DIAGNOSIS — E119 Type 2 diabetes mellitus without complications: Secondary | ICD-10-CM | POA: Diagnosis not present

## 2020-07-23 LAB — HM DIABETES EYE EXAM

## 2020-07-23 NOTE — Telephone Encounter (Signed)
Patient had a virtual with Dr Caryl Bis on  07/09/2020. Dr Caryl Bis wanted patient to try taking his zolpidem (AMBIEN) 5 MG tablet and pregabalin (LYRICA) 75 MG capsule as normal and if it was not working to call the office and he would up the dosage. Patient is requesting medication dosage to increase.

## 2020-07-23 NOTE — Telephone Encounter (Signed)
Pt would like this sent to [facility] on [address] and he also would like to ask about diabetic shoes

## 2020-07-24 ENCOUNTER — Encounter: Payer: Self-pay | Admitting: Adult Health

## 2020-07-27 ENCOUNTER — Telehealth: Payer: Self-pay

## 2020-07-27 DIAGNOSIS — N5201 Erectile dysfunction due to arterial insufficiency: Secondary | ICD-10-CM | POA: Diagnosis not present

## 2020-07-27 DIAGNOSIS — E291 Testicular hypofunction: Secondary | ICD-10-CM | POA: Diagnosis not present

## 2020-07-27 DIAGNOSIS — Z79899 Other long term (current) drug therapy: Secondary | ICD-10-CM | POA: Diagnosis not present

## 2020-07-27 MED ORDER — PREGABALIN 150 MG PO CAPS: 150.0000 mg | ORAL_CAPSULE | Freq: Two times a day (BID) | ORAL | 3 refills | Status: DC

## 2020-07-27 MED ORDER — ZOLPIDEM TARTRATE 10 MG PO TABS: 10.0000 mg | ORAL_TABLET | Freq: Every evening | ORAL | 0 refills | Status: DC | PRN

## 2020-07-27 NOTE — Telephone Encounter (Signed)
Patient stated he has not been sleeping well still with the current dosage of ambien.

## 2020-07-27 NOTE — Telephone Encounter (Signed)
PT called to inquire about his Lyrica and Ambien and advised PT that Dr.Sonnenberg is taking PTs and will get to it when he can. PT hung up frustrated.

## 2020-07-27 NOTE — Telephone Encounter (Signed)
Ambien 10 mg dose once nightly sent to the pharmacy.  He needs to monitor for drowsiness on this and if he is drowsy the next day he needs to let us know right away.

## 2020-07-27 NOTE — Telephone Encounter (Signed)
It looks like endocrinology ordered these labs for the patient.  Have they been managing his testosterone levels and his diabetes?  Based on his last visit he had just started on the Jardiance and that will likely help with his A1c which is above goal.  Given that he just started the Jardiance a few weeks ago I would recommend that he continue on that and have his A1c checked 3 months after starting the Jardiance.  The endocrinologist should also be managing the prolactin level that was low.  The patient's cholesterol is quite elevated.  Has he been taking the atorvastatin?

## 2020-07-27 NOTE — Telephone Encounter (Signed)
Noted.  I just sent the Lyrica dose in.  The other message was not clear on the need for a refill on the Lyrica.  We can plan on discussing when to recheck his cholesterol at follow-up.

## 2020-07-27 NOTE — Telephone Encounter (Addendum)
I sent the Ambien in earlier today.  The initial message was a little unclear regarding the need for a new Lyrica prescription.  I will go ahead and send that in as well.  There also appears to have been a message regarding diabetic shoes.  Typically have patient see podiatry first to evaluate their feet and get them fit for the diabetic shoes.  I can refer him if he would like.

## 2020-07-27 NOTE — Addendum Note (Signed)
Addended by: [name] on: 07/27/2020 04:31 PM   Modules accepted: Orders

## 2020-07-27 NOTE — Telephone Encounter (Signed)
Pt dropped off lab results from [place] in folder up front. Wanted Dr Caryl Bis to have

## 2020-07-27 NOTE — Addendum Note (Signed)
Addended by: Caryl Bis, Carletta Feasel G on: 07/27/2020 12:12 PM   Modules accepted: Orders

## 2020-07-28 ENCOUNTER — Other Ambulatory Visit: Payer: Self-pay | Admitting: Adult Health

## 2020-07-28 ENCOUNTER — Other Ambulatory Visit: Payer: Self-pay

## 2020-07-28 ENCOUNTER — Other Ambulatory Visit: Payer: Self-pay | Admitting: Family Medicine

## 2020-07-28 DIAGNOSIS — G629 Polyneuropathy, unspecified: Secondary | ICD-10-CM

## 2020-07-28 DIAGNOSIS — G4700 Insomnia, unspecified: Secondary | ICD-10-CM

## 2020-07-28 MED ORDER — PREGABALIN 150 MG PO CAPS: 150.0000 mg | ORAL_CAPSULE | Freq: Two times a day (BID) | ORAL | 3 refills | Status: DC

## 2020-07-28 MED ORDER — ZOLPIDEM TARTRATE 10 MG PO TABS: 10.0000 mg | ORAL_TABLET | Freq: Every evening | ORAL | 0 refills | Status: DC | PRN

## 2020-07-28 NOTE — Telephone Encounter (Signed)
These were just sent to the correct pharmacy. There is another phone note in the system about the lyrica. Can you look at that and relay that message to him as well?

## 2020-07-28 NOTE — Telephone Encounter (Signed)
Medication has [facility] to [facility] by Dr. Caryl Bis.

## 2020-07-28 NOTE — Telephone Encounter (Signed)
Left message Patient to return call to office.

## 2020-07-28 NOTE — Telephone Encounter (Signed)
Patient notified and voiced understanding will call back if the BID dosing of Lyrica 150 mg does not help andhe has no side effects.

## 2020-07-28 NOTE — Telephone Encounter (Signed)
PT called to inquire about his refill of Lyrica and Ambien. Advise the PT that [facility] of the [facility]. PT would like for it to be called into [facility] as he gets it cheaper there.

## 2020-07-28 NOTE — Telephone Encounter (Signed)
[facility].

## 2020-07-28 NOTE — Progress Notes (Signed)
Patient wanted Ambien and lyrica sent to [facility] on [address], [facility]  I called the pharmacy and cancelled the lyrica and ambien and then I sent it to [facility].  Tajuana Kniskern,cma

## 2020-07-28 NOTE — Telephone Encounter (Signed)
Pt called to follow up on refills that needed to be sent to [facility] from his visit today   pregabalin (LYRICA) 150 MG capsule zolpidem (AMBIEN) 10 MG tablet

## 2020-07-28 NOTE — Telephone Encounter (Signed)
There is a process that we have to go through with progressively increasing the dose of the lyrica. I can not suddenly increase the dose that much due to risk of adverse outcomes. He was previously on 75 mg TID and the next step up is the 150 mg BID dosing schedule. If that is not beneficial after one week and he has not had any side effects such as drowsiness after one week I can increase to 150 mg three times a day. He should contact us early next week regarding this and then I can increase the dose again if needed.

## 2020-07-28 NOTE — Telephone Encounter (Signed)
See telephone note patient aware.

## 2020-07-28 NOTE — Telephone Encounter (Signed)
Pt called and states that the Lyrica 150mg  twice daily is not going to be enough. He states that he needs 150mg  three times a day. He states that he is frustrated because his feet are causing him so much trouble and he feels like no one is helping him out. Please advise

## 2020-07-30 DIAGNOSIS — N529 Male erectile dysfunction, unspecified: Secondary | ICD-10-CM | POA: Diagnosis not present

## 2020-07-30 DIAGNOSIS — E291 Testicular hypofunction: Secondary | ICD-10-CM | POA: Diagnosis not present

## 2020-07-30 DIAGNOSIS — E1165 Type 2 diabetes mellitus with hyperglycemia: Secondary | ICD-10-CM | POA: Diagnosis not present

## 2020-07-30 DIAGNOSIS — Z6830 Body mass index (BMI) 30.0-30.9, adult: Secondary | ICD-10-CM | POA: Diagnosis not present

## 2020-07-30 DIAGNOSIS — E7800 Pure hypercholesterolemia, unspecified: Secondary | ICD-10-CM | POA: Diagnosis not present

## 2020-07-30 DIAGNOSIS — E6609 Other obesity due to excess calories: Secondary | ICD-10-CM | POA: Diagnosis not present

## 2020-08-10 DIAGNOSIS — E291 Testicular hypofunction: Secondary | ICD-10-CM | POA: Diagnosis not present

## 2020-08-10 DIAGNOSIS — K429 Umbilical hernia without obstruction or gangrene: Secondary | ICD-10-CM | POA: Diagnosis not present

## 2020-08-10 DIAGNOSIS — E7800 Pure hypercholesterolemia, unspecified: Secondary | ICD-10-CM | POA: Diagnosis not present

## 2020-08-10 DIAGNOSIS — E1165 Type 2 diabetes mellitus with hyperglycemia: Secondary | ICD-10-CM | POA: Diagnosis not present

## 2020-08-10 DIAGNOSIS — N529 Male erectile dysfunction, unspecified: Secondary | ICD-10-CM | POA: Diagnosis not present

## 2020-08-10 DIAGNOSIS — E6609 Other obesity due to excess calories: Secondary | ICD-10-CM | POA: Diagnosis not present

## 2020-08-11 ENCOUNTER — Encounter: Payer: Self-pay | Admitting: Family Medicine

## 2020-08-11 ENCOUNTER — Other Ambulatory Visit: Payer: Self-pay

## 2020-08-11 ENCOUNTER — Ambulatory Visit (INDEPENDENT_AMBULATORY_CARE_PROVIDER_SITE_OTHER): Payer: PPO

## 2020-08-11 ENCOUNTER — Ambulatory Visit (INDEPENDENT_AMBULATORY_CARE_PROVIDER_SITE_OTHER): Payer: PPO | Admitting: Family Medicine

## 2020-08-11 VITALS — BP 130/70 | HR 75 | Temp 98.4°F | Ht 69.0 in | Wt 213.0 lb

## 2020-08-11 DIAGNOSIS — G629 Polyneuropathy, unspecified: Secondary | ICD-10-CM | POA: Diagnosis not present

## 2020-08-11 DIAGNOSIS — M67431 Ganglion, right wrist: Secondary | ICD-10-CM | POA: Diagnosis not present

## 2020-08-11 DIAGNOSIS — M2550 Pain in unspecified joint: Secondary | ICD-10-CM | POA: Insufficient documentation

## 2020-08-11 DIAGNOSIS — G8929 Other chronic pain: Secondary | ICD-10-CM

## 2020-08-11 DIAGNOSIS — E782 Mixed hyperlipidemia: Secondary | ICD-10-CM

## 2020-08-11 DIAGNOSIS — E785 Hyperlipidemia, unspecified: Secondary | ICD-10-CM | POA: Insufficient documentation

## 2020-08-11 DIAGNOSIS — M25542 Pain in joints of left hand: Secondary | ICD-10-CM

## 2020-08-11 DIAGNOSIS — M25562 Pain in left knee: Secondary | ICD-10-CM | POA: Diagnosis not present

## 2020-08-11 DIAGNOSIS — G4700 Insomnia, unspecified: Secondary | ICD-10-CM

## 2020-08-11 DIAGNOSIS — E1142 Type 2 diabetes mellitus with diabetic polyneuropathy: Secondary | ICD-10-CM

## 2020-08-11 DIAGNOSIS — Z96652 Presence of left artificial knee joint: Secondary | ICD-10-CM | POA: Diagnosis not present

## 2020-08-11 DIAGNOSIS — M25541 Pain in joints of right hand: Secondary | ICD-10-CM

## 2020-08-11 DIAGNOSIS — I739 Peripheral vascular disease, unspecified: Secondary | ICD-10-CM | POA: Diagnosis not present

## 2020-08-11 DIAGNOSIS — E1169 Type 2 diabetes mellitus with other specified complication: Secondary | ICD-10-CM | POA: Insufficient documentation

## 2020-08-11 MED ORDER — OZEMPIC (0.25 OR 0.5 MG/DOSE) 2 MG/1.5ML ~~LOC~~ SOPN: PEN_INJECTOR | SUBCUTANEOUS | 1 refills | Status: DC

## 2020-08-11 MED ORDER — PREGABALIN 150 MG PO CAPS: 150.0000 mg | ORAL_CAPSULE | Freq: Three times a day (TID) | ORAL | 3 refills | Status: DC

## 2020-08-11 NOTE — Assessment & Plan Note (Signed)
X-ray today.  We will refer to orthopedics as well.

## 2020-08-11 NOTE — Assessment & Plan Note (Signed)
Much improved.  He can continue Ambien 10 mg once daily.  He will monitor for drowsiness.

## 2020-08-11 NOTE — Assessment & Plan Note (Signed)
His symptoms could be related to claudication.  We will obtain ABIs.

## 2020-08-11 NOTE — Assessment & Plan Note (Signed)
Discussed that this appears to be a ganglion cyst.  He could see orthopedics for this if desired.

## 2020-08-11 NOTE — Assessment & Plan Note (Signed)
He will continue Lipitor 40 mg once daily.  He will get [place] a copy of his recent labs.

## 2020-08-11 NOTE — Assessment & Plan Note (Signed)
Likely osteoarthritis though we will obtain labs to evaluate for other underlying causes of arthritis.

## 2020-08-11 NOTE — Assessment & Plan Note (Signed)
Improved though this continues to be an issue.  We will increase his Lyrica to 150 mg 3 times daily.  He will monitor for drowsiness with this.

## 2020-08-11 NOTE — Progress Notes (Signed)
Tommi Rumps, MD Phone: [phone]  Erik Martinez is a 65 y.o. male who presents today for follow-up.  Insomnia: Patient notes the 10 mg dose of the Ambien has been beneficial.  He gets at least 5 to 6 hours of sleep nightly.  No drowsiness the next morning.  No alcohol intake with this.  Neuropathy: Patient notes the Lyrica 150 mg twice daily has been beneficial and his symptoms are getting better.  He notes when the Lyrica from his morning dose wears off he has symptoms and he has a hard time figuring out if he needs to take the Lyrica at that point or if he needs to wait until nighttime so that he does not have symptoms at night.  Claudication: Patient notes for some time now he has had discomfort in his left posterior calf.  He used to go to walk 2 to 3 miles at a time though now if he gets 1/10 of a mile or so into the walk his left calf starts to tighten up.  Chronic joint pain, hands: Patient notes discomfort in his PIP joints in his bilateral hands.  The diclofenac orally did not help.  The topical diclofenac seem to help some.  He notes some stiffness in his hands as well.  Chronic left knee pain: Patient has trouble bending his left knee fully in the flexion direction.  Notes swelling and pain in the medial portion of his left knee at times.  Reports an x-ray when he was in prison and notes he was advised that the hardware looked okay.  He would like to see orthopedics.  Per his report he has had his knee replaced on 2 occasions.  Ganglion cyst: The patient reports a nodule over his right volar wrist along the radial aspect.  Notes he hit something for several months ago and this nodule popped out.  No significant tenderness with this.  Diabetes: His glucose has been running between 100-150.  He has been on Jardiance, metformin, and pioglitazone.  The pioglitazone was started when he was in prison.  He had lab work completed yesterday through his endocrinologist though he reports  his endocrinologist is not managing his diabetes at this time.  He notes no polyuria or polydipsia.  No hypoglycemia.  He does go to the gym and works a fairly active job.  Hyperlipidemia: Patient is currently on Lipitor.  Reports he had a lipid panel checked yesterday.  Social History   Tobacco Use  Smoking Status Never  Smokeless Tobacco Never    Current Outpatient Medications on File Prior to Visit  Medication Sig Dispense Refill   atorvastatin (LIPITOR) 40 MG tablet Take 40 mg by mouth daily.     diclofenac (VOLTAREN) 75 MG EC tablet Take 1 tablet by mouth twice daily 60 tablet 0   empagliflozin (JARDIANCE) 25 MG TABS tablet Take 1 tablet (25 mg total) by mouth daily. 90 tablet 1   hydrOXYzine (ATARAX/VISTARIL) 25 MG tablet Take 1 tablet (25 mg total) by mouth every 8 (eight) hours as needed for anxiety. 30 tablet 0   metFORMIN (GLUCOPHAGE) 1000 MG tablet Take 1,000 mg by mouth 2 (two) times daily.     Multiple Vitamins-Minerals (PX COMPLETE SENIOR MULTIVITS) TABS Take by mouth.     omeprazole (PRILOSEC) 20 MG capsule Take 1 capsule (20 mg total) by mouth daily. 30 capsule 3   zolpidem (AMBIEN) 10 MG tablet Take 1 tablet (10 mg total) by mouth at bedtime as needed for sleep.  30 tablet 0   No current facility-administered medications on file prior to visit.     ROS see history of present illness  Objective  Physical Exam Vitals:   08/11/20 0814  BP: 130/70  Pulse: 75  Temp: 98.4 F (36.9 C)  SpO2: 99%    BP Readings from Last 3 Encounters:  08/11/20 130/70  06/02/20 130/76  11/10/17 (!) 148/88   Wt Readings from Last 3 Encounters:  08/11/20 213 lb (96.6 kg)  07/09/20 207 lb (93.9 kg)  06/02/20 197 lb 9.6 oz (89.6 kg)    Physical Exam Constitutional:      General: He is not in acute distress.    Appearance: He is not diaphoretic.  Cardiovascular:     Rate and Rhythm: Normal rate and regular rhythm.     Heart sounds: Normal heart sounds.  Pulmonary:      Effort: Pulmonary effort is normal.     Breath sounds: Normal breath sounds.  Musculoskeletal:     Comments: There is a ganglion cyst over the radial volar aspect of his right wrist, nontender, surgical scar noted from prior knee surgery, there is medial left knee tenderness with some slight puffiness though no overlying erythema or warmth  Skin:    General: Skin is warm and dry.  Neurological:     Mental Status: He is alert.     Assessment/Plan: Please see individual problem list.  Problem List Items Addressed This Visit     Arthralgia    Likely osteoarthritis though we will obtain labs to evaluate for other underlying causes of arthritis.       Relevant Orders   Antinuclear Antib (ANA)   Cyclic citrul peptide antibody, IgG ([facility])   Rheumatoid Factor   Chronic pain of left knee    X-ray today.  We will refer to orthopedics as well.       Relevant Medications   pregabalin (LYRICA) 150 MG capsule   Other Relevant Orders   DG Knee Complete 4 Views Left   Ambulatory referral to Orthopedic Surgery   Diabetes ([place]) - Primary    Improved control.  Discussed that pioglitazone would not be my first choice for medication for his diabetes.  He denies any personal or family history of thyroid cancer, parathyroid cancer, or adrenal gland cancer.  We will proceed with Ozempic.  He will let us know if this is not affordable.  Discussed that a specific type of thyroid cancer has been seen in rat studies though this does not seem to be an issue in humans.  He will monitor his thyroid area.  He notes no history of gallbladder issues or pancreatitis.  If he has abdominal pain he will let us know.  Discussed risk of nausea with this medicine.  He will continue Jardiance and metformin.  He will transfer his diabetic care to our office.       Relevant Medications   metFORMIN (GLUCOPHAGE) 1000 MG tablet   Semaglutide,0.25 or 0.'5MG'$ /DOS, (OZEMPIC, 0.25 OR 0.5 MG/DOSE,) 2 MG/1.5ML SOPN   Ganglion  cyst of wrist, right    Discussed that this appears to be a ganglion cyst.  He could see orthopedics for this if desired.       HLD (hyperlipidemia)    He will continue Lipitor 40 mg once daily.  He will get [place] a copy of his recent labs.       Insomnia    Much improved.  He can continue Ambien 10 mg once daily.  He will monitor for drowsiness.       Intermittent claudication (HCC)    His symptoms could be related to claudication.  We will obtain ABIs.       Relevant Medications   pregabalin (LYRICA) 150 MG capsule   Other Relevant Orders   US ARTERIAL ABI (SCREENING LOWER EXTREMITY)   Neuropathy    Improved though this continues to be an issue.  We will increase his Lyrica to 150 mg 3 times daily.  He will monitor for drowsiness with this.       Relevant Medications   pregabalin (LYRICA) 150 MG capsule     Return in about 3 months (around 11/11/2020) for With PCP.  This visit occurred during the SARS-CoV-2 public health emergency.  Safety protocols were in place, including screening questions prior to the visit, additional usage of staff PPE, and extensive cleaning of exam room while observing appropriate contact time as indicated for disinfecting solutions.   I have spent 44 minutes in the care of this patient regarding history taking, documentation, discussion of plan, placing orders, completion of exam.   Tommi Rumps, MD [place]

## 2020-08-11 NOTE — Assessment & Plan Note (Signed)
Improved control.  Discussed that pioglitazone would not be my first choice for medication for his diabetes.  He denies any personal or family history of thyroid cancer, parathyroid cancer, or adrenal gland cancer.  We will proceed with Ozempic.  He will let us know if this is not affordable.  Discussed that a specific type of thyroid cancer has been seen in rat studies though this does not seem to be an issue in humans.  He will monitor his thyroid area.  He notes no history of gallbladder issues or pancreatitis.  If he has abdominal pain he will let us know.  Discussed risk of nausea with this medicine.  He will continue Jardiance and metformin.  He will transfer his diabetic care to our office.

## 2020-08-11 NOTE — Patient Instructions (Addendum)
Nice to see you. Please stop the pioglitazone.  We will start you on Ozempic.  If this is not affordable please let me know. I will increase your Lyrica to 150 mg 3 times daily.  If this makes you drowsy please let me know. Somebody should call you to schedule the blood flow test for your legs.  If you do not get a call in the next week please let us know. We will contact you with your x-ray and lab results. Orthopedics should call to schedule an appointment for your knee. Please bring me a copy of the lab work that was done recently.

## 2020-08-12 ENCOUNTER — Telehealth: Payer: Self-pay

## 2020-08-12 ENCOUNTER — Telehealth: Payer: Self-pay | Admitting: Family Medicine

## 2020-08-12 DIAGNOSIS — M25562 Pain in left knee: Secondary | ICD-10-CM

## 2020-08-12 DIAGNOSIS — G8929 Other chronic pain: Secondary | ICD-10-CM

## 2020-08-12 NOTE — Telephone Encounter (Signed)
Please let the patient know that we received a message from [facility] that he has been dismissed from their practice back in 2019.  He does need to see an orthopedist and at this point the other option is going to [facility] given that he did not want to go back to [facility].  I can place another referral for him to go to [facility] if he is willing to do that.

## 2020-08-12 NOTE — Telephone Encounter (Signed)
Heather with [facility] called and states that the pt was dismissed from there in 2019 for confidential reasons so they are unable to accept the referral. Please advise

## 2020-08-12 NOTE — Telephone Encounter (Signed)
Rejection Reason - Other - Pt has been dismissed from [facility] as of 06/15/2017" Grayland Jack said on Aug 12, 2020 8:51 AM  Msg from [facility] orthopedic surgery

## 2020-08-13 ENCOUNTER — Other Ambulatory Visit: Payer: Self-pay | Admitting: Family Medicine

## 2020-08-13 DIAGNOSIS — M25562 Pain in left knee: Secondary | ICD-10-CM

## 2020-08-13 DIAGNOSIS — G8929 Other chronic pain: Secondary | ICD-10-CM

## 2020-08-13 LAB — RHEUMATOID FACTOR: Rheumatoid fact SerPl-aCnc: <14 IU/mL (ref <14)

## 2020-08-13 LAB — ANA: Anti Nuclear Antibody (ANA): NEGATIVE

## 2020-08-13 LAB — CYCLIC CITRUL PEPTIDE ANTIBODY, IGG: Cyclic Citrullin Peptide Ab: <16 UNITS

## 2020-08-17 NOTE — Telephone Encounter (Signed)
Patient called in and stated that Dr.Hootens office at [facility] approved for him to be seen and they would like a referral sent over.

## 2020-08-17 NOTE — Telephone Encounter (Signed)
Referral placed.

## 2020-08-17 NOTE — Addendum Note (Signed)
Addended by: Caryl Bis Deantre Bourdon G on: 08/17/2020 12:17 PM   Modules accepted: Orders

## 2020-08-20 ENCOUNTER — Other Ambulatory Visit: Payer: Self-pay | Admitting: Family Medicine

## 2020-08-20 DIAGNOSIS — E291 Testicular hypofunction: Secondary | ICD-10-CM | POA: Diagnosis not present

## 2020-08-20 DIAGNOSIS — E7800 Pure hypercholesterolemia, unspecified: Secondary | ICD-10-CM | POA: Diagnosis not present

## 2020-08-20 DIAGNOSIS — N529 Male erectile dysfunction, unspecified: Secondary | ICD-10-CM | POA: Diagnosis not present

## 2020-08-20 DIAGNOSIS — K429 Umbilical hernia without obstruction or gangrene: Secondary | ICD-10-CM | POA: Diagnosis not present

## 2020-08-20 DIAGNOSIS — E6609 Other obesity due to excess calories: Secondary | ICD-10-CM | POA: Diagnosis not present

## 2020-08-20 DIAGNOSIS — E1165 Type 2 diabetes mellitus with hyperglycemia: Secondary | ICD-10-CM | POA: Diagnosis not present

## 2020-08-20 MED ORDER — MELOXICAM 15 MG PO TABS: 15.0000 mg | ORAL_TABLET | Freq: Every day | ORAL | 0 refills | Status: DC

## 2020-08-24 ENCOUNTER — Telehealth: Payer: Self-pay | Admitting: Adult Health

## 2020-08-24 ENCOUNTER — Other Ambulatory Visit: Payer: Self-pay | Admitting: Family Medicine

## 2020-08-24 DIAGNOSIS — G4700 Insomnia, unspecified: Secondary | ICD-10-CM

## 2020-08-24 MED ORDER — ZOLPIDEM TARTRATE 10 MG PO TABS: 10.0000 mg | ORAL_TABLET | Freq: Every evening | ORAL | 1 refills | Status: DC | PRN

## 2020-08-24 NOTE — Telephone Encounter (Signed)
Patient needs a refill on his zolpidem (AMBIEN) 10 MG tablet.

## 2020-08-24 NOTE — Telephone Encounter (Signed)
Refilled: 07/28/2020 Last OV: 08/11/2020 with Dr. Caryl Bis Next OV: 11/12/2020

## 2020-08-24 NOTE — Telephone Encounter (Signed)
Refilled I looked up patient on [place] Controlled Substances Reporting System PMP AWARE and saw no activity that raised concern of inappropriate use.

## 2020-08-26 ENCOUNTER — Telehealth: Payer: Self-pay | Admitting: Adult Health

## 2020-08-26 DIAGNOSIS — G4700 Insomnia, unspecified: Secondary | ICD-10-CM

## 2020-08-26 MED ORDER — ZOLPIDEM TARTRATE 10 MG PO TABS: 10.0000 mg | ORAL_TABLET | Freq: Every evening | ORAL | 1 refills | Status: DC | PRN

## 2020-08-26 NOTE — Telephone Encounter (Signed)
Patient ran out of his zolpidem (AMBIEN) 10 MG tablet and called his pharmacy to get a refill but they cannot fill it until tomorrow.He stated that he cannot miss a day of this medicine.Please advise.\

## 2020-08-26 NOTE — Telephone Encounter (Signed)
Patient has been informed and stated he will pay out of pocket. He really needs his medication to sleep. Spoken to pharmacy. They would need a new script sent in with today's date on it due to control substance laws.

## 2020-08-26 NOTE — Telephone Encounter (Signed)
done

## 2020-08-26 NOTE — Telephone Encounter (Signed)
Pt called back about ambien-states he is short one and needs it today or he wont sleep. Please advise

## 2020-08-27 ENCOUNTER — Telehealth: Payer: Self-pay | Admitting: Adult Health

## 2020-08-27 ENCOUNTER — Other Ambulatory Visit: Payer: Self-pay | Admitting: Family Medicine

## 2020-08-27 ENCOUNTER — Encounter: Payer: Self-pay | Admitting: Neurology

## 2020-08-27 DIAGNOSIS — E1165 Type 2 diabetes mellitus with hyperglycemia: Secondary | ICD-10-CM

## 2020-08-27 DIAGNOSIS — G629 Polyneuropathy, unspecified: Secondary | ICD-10-CM

## 2020-08-27 NOTE — Telephone Encounter (Signed)
Please follow-up with the patient to see if he was able to see Dr. Marry Guan at [facility] for his knee.

## 2020-08-27 NOTE — Telephone Encounter (Signed)
Rejection Reason - Patient was No Show - PATIENT NO SHOW---08/26/2020 3:30 PM APPOINTMENT" Erik Martinez said on Aug 26, 2020 5:10 PM  Msg from [facility] orthopedic spec

## 2020-08-31 ENCOUNTER — Ambulatory Visit
Admission: RE | Admit: 2020-08-31 | Discharge: 2020-08-31 | Disposition: A | Discharge status: Home / Self Care | Payer: PPO | Source: Ambulatory Visit | Attending: Family Medicine | Admitting: Family Medicine

## 2020-08-31 ENCOUNTER — Other Ambulatory Visit: Payer: Self-pay

## 2020-08-31 DIAGNOSIS — I739 Peripheral vascular disease, unspecified: Secondary | ICD-10-CM | POA: Diagnosis not present

## 2020-08-31 DIAGNOSIS — I70212 Atherosclerosis of native arteries of extremities with intermittent claudication, left leg: Secondary | ICD-10-CM | POA: Diagnosis not present

## 2020-09-02 ENCOUNTER — Telehealth: Payer: Self-pay

## 2020-09-02 NOTE — Telephone Encounter (Signed)
Pt returned your call about [place] results. Please call back at your earliest convenience. He states that he will have his phone with him

## 2020-09-02 NOTE — Telephone Encounter (Signed)
Patient is returning your call from the message below.

## 2020-09-02 NOTE — Telephone Encounter (Signed)
Called the patient; see results note.  Gilverto Dileonardo,cma

## 2020-09-03 ENCOUNTER — Telehealth: Payer: Self-pay | Admitting: Adult Health

## 2020-09-03 ENCOUNTER — Other Ambulatory Visit: Payer: Self-pay | Admitting: Family Medicine

## 2020-09-03 DIAGNOSIS — R6889 Other general symptoms and signs: Secondary | ICD-10-CM

## 2020-09-03 NOTE — Telephone Encounter (Signed)
Noted.  Podiatry referral placed.

## 2020-09-03 NOTE — Telephone Encounter (Signed)
Patient is calling to see if pregabalin (LYRICA) 150 MG capsule can be increased to 3 times a day or to see if he can take gabapentin with it.Please advise.

## 2020-09-03 NOTE — Addendum Note (Signed)
Addended by: [name] on: 09/03/2020 02:04 PM   Modules accepted: Orders

## 2020-09-04 DIAGNOSIS — R200 Anesthesia of skin: Secondary | ICD-10-CM | POA: Diagnosis not present

## 2020-09-04 DIAGNOSIS — R202 Paresthesia of skin: Secondary | ICD-10-CM | POA: Diagnosis not present

## 2020-09-08 DIAGNOSIS — K429 Umbilical hernia without obstruction or gangrene: Secondary | ICD-10-CM | POA: Diagnosis not present

## 2020-09-08 DIAGNOSIS — E7800 Pure hypercholesterolemia, unspecified: Secondary | ICD-10-CM | POA: Diagnosis not present

## 2020-09-08 DIAGNOSIS — E6609 Other obesity due to excess calories: Secondary | ICD-10-CM | POA: Diagnosis not present

## 2020-09-08 DIAGNOSIS — N529 Male erectile dysfunction, unspecified: Secondary | ICD-10-CM | POA: Diagnosis not present

## 2020-09-08 DIAGNOSIS — E291 Testicular hypofunction: Secondary | ICD-10-CM | POA: Diagnosis not present

## 2020-09-08 DIAGNOSIS — E1165 Type 2 diabetes mellitus with hyperglycemia: Secondary | ICD-10-CM | POA: Diagnosis not present

## 2020-09-10 ENCOUNTER — Ambulatory Visit: Payer: PPO | Admitting: Endocrinology

## 2020-09-10 DIAGNOSIS — M24662 Ankylosis, left knee: Secondary | ICD-10-CM | POA: Diagnosis not present

## 2020-09-10 DIAGNOSIS — T84033A Mechanical loosening of internal left knee prosthetic joint, initial encounter: Secondary | ICD-10-CM | POA: Diagnosis not present

## 2020-09-10 DIAGNOSIS — Z96652 Presence of left artificial knee joint: Secondary | ICD-10-CM | POA: Diagnosis not present

## 2020-09-10 DIAGNOSIS — M25562 Pain in left knee: Secondary | ICD-10-CM | POA: Diagnosis not present

## 2020-09-11 NOTE — Telephone Encounter (Signed)
The prescription for the lyrica is already written for three times a day. How has he been taking it?

## 2020-09-15 ENCOUNTER — Ambulatory Visit: Payer: PPO | Admitting: Podiatry

## 2020-09-18 DIAGNOSIS — N529 Male erectile dysfunction, unspecified: Secondary | ICD-10-CM | POA: Diagnosis not present

## 2020-09-18 DIAGNOSIS — E291 Testicular hypofunction: Secondary | ICD-10-CM | POA: Diagnosis not present

## 2020-09-18 NOTE — Telephone Encounter (Signed)
Patient is returning your call from earlier. 

## 2020-09-18 NOTE — Telephone Encounter (Signed)
Placed call to pt to give provider message below. LMTCB

## 2020-09-18 NOTE — Telephone Encounter (Signed)
Placed call to pt. Pt states he has been taking the Lyrica 150 mg TID. He was asking to have the dose change to '200mg'$  TID. Pt states its working some but his feet are still hurting him bad. Pt states he has a nerve study next week.

## 2020-09-20 NOTE — Telephone Encounter (Signed)
Who ordered the nerve conduction study? Is this with neurology? He is already on a high dose of the lyrica and really needs to see neurology for further recommendations on his dose and other treatments for this issue.

## 2020-09-21 ENCOUNTER — Other Ambulatory Visit: Payer: Self-pay

## 2020-09-21 ENCOUNTER — Encounter (INDEPENDENT_AMBULATORY_CARE_PROVIDER_SITE_OTHER): Payer: PPO | Admitting: Vascular Surgery

## 2020-09-21 DIAGNOSIS — K429 Umbilical hernia without obstruction or gangrene: Secondary | ICD-10-CM | POA: Diagnosis not present

## 2020-09-21 DIAGNOSIS — N529 Male erectile dysfunction, unspecified: Secondary | ICD-10-CM | POA: Diagnosis not present

## 2020-09-21 DIAGNOSIS — E7800 Pure hypercholesterolemia, unspecified: Secondary | ICD-10-CM | POA: Diagnosis not present

## 2020-09-21 DIAGNOSIS — E1165 Type 2 diabetes mellitus with hyperglycemia: Secondary | ICD-10-CM | POA: Diagnosis not present

## 2020-09-21 DIAGNOSIS — E291 Testicular hypofunction: Secondary | ICD-10-CM | POA: Diagnosis not present

## 2020-09-21 DIAGNOSIS — E6609 Other obesity due to excess calories: Secondary | ICD-10-CM | POA: Diagnosis not present

## 2020-09-21 DIAGNOSIS — Z6830 Body mass index (BMI) 30.0-30.9, adult: Secondary | ICD-10-CM | POA: Diagnosis not present

## 2020-09-21 NOTE — Progress Notes (Deleted)
MRN : [id number]  Erik Martinez is a 65 y.o. (11-13-1955) male who presents with chief complaint of check legs .  History of Present Illness:    The patient is seen for evaluation of painful lower extremities and diminished pulses. Patient notes the pain is always associated with activity and is very consistent day today. Typically, the pain occurs at less than one block, progress is as activity continues to the point that the patient must stop walking. Resting including standing still for several minutes allowed resumption of the activity and the ability to walk a similar distance before stopping again. Uneven terrain and inclined shorten the distance. The pain has been progressive over the past several years. The patient states the inability to walk is now having a profound negative impact on quality of life and daily activities.  The patient denies rest pain or dangling of an extremity off the side of the bed during the night for relief. No open wounds or sores at this time. No prior interventions or surgeries.  No history of back problems or DJD of the lumbar sacral spine.   The patient denies changes in claudication symptoms or new rest pain symptoms.  No new ulcers or wounds of the foot.  The patient's blood pressure has been stable and relatively well controlled. The patient denies amaurosis fugax or recent TIA symptoms. There are no recent neurological changes noted. The patient denies history of DVT, PE or superficial thrombophlebitis. The patient denies recent episodes of angina or shortness of breath.    No outpatient medications have been marked as taking for the 09/21/20 encounter (Appointment) with Delana Meyer, Dolores Lory, MD.    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   Nocturia    PONV (postoperative nausea and vomiting)    "difficult to put me to sleep"   Spinal headache    Urine incontinence     Past Surgical History:  Procedure Laterality Date   BACK SURGERY     2 rods and 4 screws artificial disc   INTERSTIM IMPLANT PLACEMENT  2007   INTERSTIM IMPLANT REVISION N/A 08/17/2012   Procedure: REPLACMENT OF IPG PLUS REPLACE LEAD OF INTERSTIM IMPLANT ;  Surgeon: [name], MD;  Location: [facility];  Service: Urology;  Laterality: N/A;   JOINT REPLACEMENT     Lt knee and Lt hip   LUMBAR DISC SURGERY  2005   L5   LUMBAR FUSION  X2  2006  &  2007   OPEN DEBRIDEMENT LEFT TOTAL KNEE (SCAR, BONEY GRAOWTH)/ REMOVAL OLD SUTURES  05-01-2007   PARTIAL HIP ARTHROPLASTY Left 2001   x2   SHOULDER OPEN ROTATOR CUFF REPAIR Left 2000   TOTAL HIP ARTHROPLASTY Left 2002  TOTAL KNEE ARTHROPLASTY Left    PARTIAL LEFT KNEE REPLACEMENT PRIOR TO THIS   TOTAL KNEE REVISION Left 2008   TRIGGER FINGER RELEASE Bilateral    SEVERAL FINGERS   TRIGGER FINGER RELEASE Right 06/18/2014   Procedure: RELEASE TRIGGER FINGER/A-1 PULLEY;  Surgeon: [name], MD;  Location: [facility];  Service: Orthopedics;  Laterality: Right;    Social History Social History   Tobacco Use   Smoking status: Never   Smokeless tobacco: Never  Vaping Use   Vaping Use: Never used  Substance Use Topics   Alcohol use: Not Currently    Alcohol/week: 0.0 - 2.0 standard drinks   Drug use: Not Currently    Types: Cocaine    Comment: pt states its been years since he used cocaine     Family History Family History  Problem Relation Age of Onset   Diabetes Mother    Arthritis Mother    Cancer Mother    Hyperlipidemia Mother    Stroke Mother    Arthritis Father    Cancer Father     Allergies  Allergen Reactions   Suboxone [Buprenorphine Hcl-Naloxone Hcl] Nausea And Vomiting   Tape Other (See Comments)    Whelps *Paper Tape is ok    Codeine Rash, Itching and Nausea Only   Morphine And Related Itching and Rash   Other Rash    Telemetry electrodes     REVIEW OF SYSTEMS (Negative unless checked)  Constitutional: '[]'$ Weight loss  '[]'$ Fever  '[]'$ Chills Cardiac: '[]'$ Chest pain   '[]'$ Chest pressure   '[]'$ Palpitations   '[]'$ Shortness of breath when laying flat   '[]'$ Shortness of breath with exertion. Vascular:  '[]'$ Pain in legs with walking   '[]'$ Pain in legs at rest  '[]'$ History of DVT   '[]'$ Phlebitis   '[]'$ Swelling in legs   '[]'$ Varicose veins   '[]'$ Non-healing ulcers Pulmonary:   '[]'$ Uses home oxygen   '[]'$ Productive cough   '[]'$ Hemoptysis   '[]'$ Wheeze  '[]'$ COPD   '[]'$ Asthma Neurologic:  '[]'$ Dizziness   '[]'$ Seizures   '[]'$ History of stroke   '[]'$ History of TIA  '[]'$ Aphasia   '[]'$ Vissual changes   '[]'$ Weakness or numbness in arm   '[]'$ Weakness or numbness in leg Musculoskeletal:   '[]'$ Joint swelling   '[]'$ Joint pain   '[]'$ Low back pain Hematologic:  '[]'$ Easy bruising  '[]'$ Easy bleeding   '[]'$ Hypercoagulable state   '[]'$ Anemic Gastrointestinal:  '[]'$ Diarrhea   '[]'$ Vomiting  '[]'$ Gastroesophageal reflux/heartburn   '[]'$ Difficulty swallowing. Genitourinary:  '[]'$ Chronic kidney disease   '[]'$ Difficult urination  '[]'$ Frequent urination   '[]'$ Blood in urine Skin:  '[]'$ Rashes   '[]'$ Ulcers  Psychological:  '[]'$ History of anxiety   '[]'$  History of major depression.  Physical Examination  There were no vitals filed for this visit. There is no height or weight on file to calculate BMI. Gen: WD/WN, NAD Head: [place]/AT, No temporalis wasting.  Ear/Nose/Throat: Hearing grossly intact, nares w/o erythema or drainage Eyes: PER, EOMI, sclera nonicteric.  Neck: Supple,  no masses.  No bruit or JVD.  Pulmonary:  Good air movement, no audible wheezing, no use of accessory muscles.  Cardiac: RRR, normal S1, S2, no Murmurs. Vascular:  *** Vessel Right Left  Radial Palpable Palpable  Carotid Palpable Palpable  PT Palpable Palpable  DP Palpable Palpable  Gastrointestinal: soft, non-distended. No guarding/no peritoneal signs.  Musculoskeletal: M/S 5/5 throughout.  No visible deformity.  Neurologic: CN 2-12 intact. Pain and light touch intact in extremities.  Symmetrical.  Speech is fluent. Motor exam as listed above. Psychiatric: Judgment intact, Mood & affect appropriate for  pt's clinical situation. Dermatologic: No rashes or ulcers noted.  No changes consistent with cellulitis.   CBC Lab Results  Component Value Date   WBC 5.0 06/19/2020   HGB 13.4 (A) 06/19/2020   HCT 40 (A) 06/19/2020   MCV 88.8 11/22/2017   PLT 183 06/19/2020    BMET    Component Value Date/Time   NA 139 06/19/2020 0000   NA 142 05/23/2013 0329   K 4.7 06/19/2020 0000   K 3.8 05/23/2013 0329   CL 95 (A) 06/19/2020 0000   CL 109 (H) 05/23/2013 0329   CO2 24 (A) 06/19/2020 0000   CO2 27 05/23/2013 0329   GLUCOSE 142 (H) 11/22/2017 0821   GLUCOSE 151 (H) 05/23/2013 0329   BUN 27 (A) 06/19/2020 0000   BUN 5 (L) 05/23/2013 0329   CREATININE 1.11 11/22/2017 0821   CREATININE 0.98 05/27/2013 0301   CALCIUM 9.5 06/19/2020 0000   CALCIUM 8.5 05/23/2013 0329   GFRNONAA >60 12/04/2016 0052   GFRNONAA >60 05/27/2013 0301   GFRAA >60 12/04/2016 0052   GFRAA >60 05/27/2013 0301   CrCl cannot be calculated (Patient's most recent lab result is older than the maximum 21 days allowed.).  COAG Lab Results  Component Value Date   INR 0.95 10/17/2016   INR 1.04 07/15/2016   INR 1.0 04/30/2007    Radiology US ARTERIAL ABI (SCREENING LOWER EXTREMITY)  Result Date: 08/31/2020 CLINICAL DATA:  65 year old male with left calf claudication. EXAM: NONINVASIVE PHYSIOLOGIC VASCULAR  STUDY OF BILATERAL LOWER EXTREMITIES TECHNIQUE: Evaluation of both lower extremities were performed at rest, including calculation of ankle-brachial indices with single level Doppler, pressure and pulse volume recording. COMPARISON:  None. FINDINGS: Right ABI:  Noncompressible. Left ABI: 1.18 calculated from the posterior tibial artery, the dorsalis pedis artery is noncompressible. Right Lower Extremity:  Biphasic arterial waveforms at the ankle. Left Lower Extremity:  Biphasic arterial waveforms at the ankle. IMPRESSION: Noncompressible ankle-brachial indices. Consider lower extremity arterial duplex/segmental examination for further evaluation of possible peripheral artery disease. Ruthann Cancer, MD [facility] Radiology Electronically Signed   By: Ruthann Cancer M.D.   On: 08/31/2020 15:03     Assessment/Plan    [name], MD  09/21/2020 1:33 PM

## 2020-09-23 DIAGNOSIS — I70219 Atherosclerosis of native arteries of extremities with intermittent claudication, unspecified extremity: Secondary | ICD-10-CM | POA: Insufficient documentation

## 2020-09-23 NOTE — Progress Notes (Signed)
MRN : [id number]  Erik Martinez is a 65 y.o. (04/25/1955) male who presents with chief complaint of check legs.  History of Present Illness:    The patient is seen for evaluation of painful lower extremities and diminished pulses. Patient notes the pain is always associated with activity and is very consistent day today. Typically, the pain occurs at less than one block, progress is as activity continues to the point that the patient must stop walking. Resting including standing still for several minutes allowed resumption of the activity and the ability to walk a similar distance before stopping again. Uneven terrain and inclined shorten the distance. The pain has been progressive over the past several years. The patient states the inability to walk is now having a profound negative impact on quality of life and daily activities.  The patient denies rest pain or dangling of an extremity off the side of the bed during the night for relief. No open wounds or sores at this time. No prior interventions or surgeries.  No history of back problems or DJD of the lumbar sacral spine.   The patient denies changes in claudication symptoms or new rest pain symptoms.  No new ulcers or wounds of the foot.  The patient's blood pressure has been stable and relatively well controlled. The patient denies amaurosis fugax or recent TIA symptoms. There are no recent neurological changes noted. The patient denies history of DVT, PE or superficial thrombophlebitis. The patient denies recent episodes of angina or shortness of breath.    ABI's dated 08/31/2020  Rt=[place] (biphasic) and Lt=[place] (biphaisic)  No outpatient medications have been marked as taking for the 09/24/20 encounter (Appointment) with Delana Meyer, Dolores Lory, MD.    Past Medical History:  Diagnosis Date   Anxiety    Arthritis    Avascular necrosis of bones of both hips ([place])    Bipolar disorder ([place])    Bladder spasms    Chronic pain of left  knee    Chronic pain syndrome    PAIN CLINIC   Cocaine use disorder, moderate, dependence ([place]) 09/09/2015   DDD (degenerative disc disease)    SPINE   Depression    Diabetes mellitus without complication (HCC)    Frequency-urgency syndrome    GERD (gastroesophageal reflux disease)    Hepatitis    Hepatitis    History of hepatitis B    1983  TX'D--  NO ISSUES OR SYMPTOMS SINCE   Hypotestosteronemia    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   Nocturia    PONV (postoperative nausea and vomiting)    "difficult to put me to sleep"   Spinal headache    Urine incontinence     Past Surgical History:  Procedure Laterality Date   BACK SURGERY     2 rods and 4 screws artificial disc   INTERSTIM IMPLANT PLACEMENT  2007   INTERSTIM IMPLANT REVISION N/A 08/17/2012   Procedure: REPLACMENT OF IPG PLUS REPLACE LEAD OF INTERSTIM IMPLANT ;  Surgeon: [name], MD;  Location: [place];  Service: Urology;  Laterality: N/A;   JOINT REPLACEMENT     Lt knee and Lt hip   LUMBAR DISC SURGERY  2005   L5   LUMBAR FUSION  X2  2006  &  2007   OPEN DEBRIDEMENT LEFT TOTAL KNEE (SCAR, BONEY GRAOWTH)/ REMOVAL OLD SUTURES  05-01-2007   PARTIAL HIP ARTHROPLASTY Left 2001   x2   SHOULDER OPEN ROTATOR CUFF REPAIR Left 2000  TOTAL HIP ARTHROPLASTY Left 2002   TOTAL KNEE ARTHROPLASTY Left    PARTIAL LEFT KNEE REPLACEMENT PRIOR TO THIS   TOTAL KNEE REVISION Left 2008   TRIGGER FINGER RELEASE Bilateral    SEVERAL FINGERS   TRIGGER FINGER RELEASE Right 06/18/2014   Procedure: RELEASE TRIGGER FINGER/A-1 PULLEY;  Surgeon: [name], MD;  Location: [facility];  Service: Orthopedics;  Laterality: Right;    Social History Social History   Tobacco Use   Smoking status: Never   Smokeless tobacco: Never  Vaping Use   Vaping Use: Never used  Substance Use Topics   Alcohol use: Not Currently    Alcohol/week: 0.0 - 2.0 standard drinks   Drug use: Not Currently    Types: Cocaine     Comment: pt states its been years since he used cocaine    Family History Family History  Problem Relation Age of Onset   Diabetes Mother    Arthritis Mother    Cancer Mother    Hyperlipidemia Mother    Stroke Mother    Arthritis Father    Cancer Father     Allergies  Allergen Reactions   Suboxone [Buprenorphine Hcl-Naloxone Hcl] Nausea And Vomiting   Tape Other (See Comments)    Whelps *Paper Tape is ok    Codeine Rash, Itching and Nausea Only   Morphine And Related Itching and Rash   Other Rash    Telemetry electrodes     REVIEW OF SYSTEMS (Negative unless checked)  Constitutional: '[]'$ Weight loss  '[]'$ Fever  '[]'$ Chills Cardiac: '[]'$ Chest pain   '[]'$ Chest pressure   '[]'$ Palpitations   '[]'$ Shortness of breath when laying flat   '[]'$ Shortness of breath with exertion. Vascular:  '[x]'$ Pain in legs with walking   '[]'$ Pain in legs at rest  '[]'$ History of DVT   '[]'$ Phlebitis   '[]'$ Swelling in legs   '[]'$ Varicose veins   '[]'$ Non-healing ulcers Pulmonary:   '[]'$ Uses home oxygen   '[]'$ Productive cough   '[]'$ Hemoptysis   '[]'$ Wheeze  '[]'$ COPD   '[]'$ Asthma Neurologic:  '[]'$ Dizziness   '[]'$ Seizures   '[]'$ History of stroke   '[]'$ History of TIA  '[]'$ Aphasia   '[]'$ Vissual changes   '[]'$ Weakness or numbness in arm   '[]'$ Weakness or numbness in leg Musculoskeletal:   '[]'$ Joint swelling   '[]'$ Joint pain   '[]'$ Low back pain Hematologic:  '[]'$ Easy bruising  '[]'$ Easy bleeding   '[]'$ Hypercoagulable state   '[]'$ Anemic Gastrointestinal:  '[]'$ Diarrhea   '[]'$ Vomiting  '[x]'$ Gastroesophageal reflux/heartburn   '[]'$ Difficulty swallowing. Genitourinary:  '[]'$ Chronic kidney disease   '[]'$ Difficult urination  '[]'$ Frequent urination   '[]'$ Blood in urine Skin:  '[]'$ Rashes   '[]'$ Ulcers  Psychological:  '[]'$ History of anxiety   '[]'$  History of major depression.  Physical Examination  There were no vitals filed for this visit. There is no height or weight on file to calculate BMI. Gen: WD/WN, NAD Head: [place]/AT, No temporalis wasting.  Ear/Nose/Throat: Hearing grossly intact, nares w/o erythema or  drainage Eyes: PER, EOMI, sclera nonicteric.  Neck: Supple, no masses.  No bruit or JVD.  Pulmonary:  Good air movement, no audible wheezing, no use of accessory muscles.  Cardiac: RRR, normal S1, S2, no Murmurs. Vascular:   No ulcers noted >3 second cap refill Vessel Right Left  Radial Palpable Palpable  PT Not Palpable Not Palpable  DP Not Palpable Not Palpable  Gastrointestinal: soft, non-distended. No guarding/no peritoneal signs.  Musculoskeletal: M/S 5/5 throughout.  No visible deformity.  Neurologic: CN 2-12 intact. Pain and light touch intact in extremities.  Symmetrical.  Speech is  fluent. Motor exam as listed above. Psychiatric: Judgment intact, Mood & affect appropriate for pt's clinical situation. Dermatologic: No rashes or ulcers noted.  No changes consistent with cellulitis.   CBC Lab Results  Component Value Date   WBC 5.0 06/19/2020   HGB 13.4 (A) 06/19/2020   HCT 40 (A) 06/19/2020   MCV 88.8 11/22/2017   PLT 183 06/19/2020    BMET    Component Value Date/Time   NA 139 06/19/2020 0000   NA 142 05/23/2013 0329   K 4.7 06/19/2020 0000   K 3.8 05/23/2013 0329   CL 95 (A) 06/19/2020 0000   CL 109 (H) 05/23/2013 0329   CO2 24 (A) 06/19/2020 0000   CO2 27 05/23/2013 0329   GLUCOSE 142 (H) 11/22/2017 0821   GLUCOSE 151 (H) 05/23/2013 0329   BUN 27 (A) 06/19/2020 0000   BUN 5 (L) 05/23/2013 0329   CREATININE 1.11 11/22/2017 0821   CREATININE 0.98 05/27/2013 0301   CALCIUM 9.5 06/19/2020 0000   CALCIUM 8.5 05/23/2013 0329   GFRNONAA >60 12/04/2016 0052   GFRNONAA >60 05/27/2013 0301   GFRAA >60 12/04/2016 0052   GFRAA >60 05/27/2013 0301   CrCl cannot be calculated (Patient's most recent lab result is older than the maximum 21 days allowed.).  COAG Lab Results  Component Value Date   INR 0.95 10/17/2016   INR 1.04 07/15/2016   INR 1.0 04/30/2007    Radiology US ARTERIAL ABI (SCREENING LOWER EXTREMITY)  Result Date: 08/31/2020 CLINICAL DATA:   65 year old male with left calf claudication. EXAM: NONINVASIVE PHYSIOLOGIC VASCULAR STUDY OF BILATERAL LOWER EXTREMITIES TECHNIQUE: Evaluation of both lower extremities were performed at rest, including calculation of ankle-brachial indices with single level Doppler, pressure and pulse volume recording. COMPARISON:  None. FINDINGS: Right ABI:  Noncompressible. Left ABI: 1.18 calculated from the posterior tibial artery, the dorsalis pedis artery is noncompressible. Right Lower Extremity:  Biphasic arterial waveforms at the ankle. Left Lower Extremity:  Biphasic arterial waveforms at the ankle. IMPRESSION: Noncompressible ankle-brachial indices. Consider lower extremity arterial duplex/segmental examination for further evaluation of possible peripheral artery disease. Ruthann Cancer, MD [facility] Radiology Electronically Signed   By: Ruthann Cancer M.D.   On: 08/31/2020 15:03     Assessment/Plan 1. Atherosclerosis of native artery of both lower extremities with intermittent claudication (HCC) Recommend:  The patient has evidence of severe atherosclerotic changes of both lower extremities with rest pain that is associated with preulcerative changes and impending tissue loss of the left foot.  This represents a limb threatening ischemia and places the patient at the risk for left limb loss.  Patient should undergo angiography of the left lower extremities with the hope for intervention for limb salvage.  The risks and benefits as well as the alternative therapies was discussed in detail with the patient.  All questions were answered.  Patient agrees to proceed with left angiography.  The patient will follow up with me in the office after the procedure.   2. Type 2 diabetes mellitus with diabetic polyneuropathy, without long-term current use of insulin (HCC) Continue hypoglycemic medications as already ordered, these medications have been reviewed and there  are no changes at this time.  Hgb A1C to be monitored as already arranged by primary service   3. Mixed hyperlipidemia Continue statin as ordered and reviewed, no changes at this time   4. Gastroesophageal reflux disease without esophagitis Continue PPI as already ordered, this medication has been reviewed and  there are no changes at this time.  Avoidence of caffeine and alcohol  Moderate elevation of the head of the bed      [name], MD  09/23/2020 9:46 PM

## 2020-09-24 ENCOUNTER — Other Ambulatory Visit: Payer: Self-pay

## 2020-09-24 ENCOUNTER — Ambulatory Visit (INDEPENDENT_AMBULATORY_CARE_PROVIDER_SITE_OTHER): Payer: PPO | Admitting: Vascular Surgery

## 2020-09-24 VITALS — BP 135/74 | HR 75 | Ht 70.0 in | Wt 211.0 lb

## 2020-09-24 DIAGNOSIS — E782 Mixed hyperlipidemia: Secondary | ICD-10-CM | POA: Diagnosis not present

## 2020-09-24 DIAGNOSIS — E1142 Type 2 diabetes mellitus with diabetic polyneuropathy: Secondary | ICD-10-CM

## 2020-09-24 DIAGNOSIS — I70213 Atherosclerosis of native arteries of extremities with intermittent claudication, bilateral legs: Secondary | ICD-10-CM

## 2020-09-24 DIAGNOSIS — K219 Gastro-esophageal reflux disease without esophagitis: Secondary | ICD-10-CM

## 2020-09-27 ENCOUNTER — Encounter (INDEPENDENT_AMBULATORY_CARE_PROVIDER_SITE_OTHER): Payer: Self-pay | Admitting: Vascular Surgery

## 2020-09-29 ENCOUNTER — Encounter: Payer: Self-pay | Admitting: Podiatry

## 2020-09-29 ENCOUNTER — Telehealth: Payer: Self-pay | Admitting: Adult Health

## 2020-09-29 ENCOUNTER — Other Ambulatory Visit: Payer: Self-pay

## 2020-09-29 ENCOUNTER — Ambulatory Visit: Payer: PPO | Admitting: Podiatry

## 2020-09-29 DIAGNOSIS — E0843 Diabetes mellitus due to underlying condition with diabetic autonomic (poly)neuropathy: Secondary | ICD-10-CM | POA: Diagnosis not present

## 2020-09-29 DIAGNOSIS — M5416 Radiculopathy, lumbar region: Secondary | ICD-10-CM | POA: Diagnosis not present

## 2020-09-29 DIAGNOSIS — G629 Polyneuropathy, unspecified: Secondary | ICD-10-CM | POA: Diagnosis not present

## 2020-09-29 NOTE — Telephone Encounter (Signed)
See other telephone note.  

## 2020-09-29 NOTE — Telephone Encounter (Signed)
Patient called office back and said he had no idea why office called.

## 2020-09-29 NOTE — Progress Notes (Signed)
   HPI: 65 y.o. male presenting today as a new patient for evaluation of bilateral lower extremity pain.  Patient states that he does have a history of lumbar radiculopathy with peripheral neuropathy which is complicated by diabetes.  Over the past year he is managed his diabetes well and closely monitors his blood glucose levels.  He presents for further treatment and evaluation  Past Medical History:  Diagnosis Date   Anxiety    Arthritis    Avascular necrosis of bones of both hips (HCC)    Bipolar disorder (HCC)    Bladder spasms    Chronic pain of left knee    Chronic pain syndrome    PAIN CLINIC   Cocaine use disorder, moderate, dependence ([place]) 09/09/2015   DDD (degenerative disc disease)    SPINE   Depression    Diabetes mellitus without complication (HCC)    Frequency-urgency syndrome    GERD (gastroesophageal reflux disease)    Hepatitis    Hepatitis    History of hepatitis B    1983  TX'D--  NO ISSUES OR SYMPTOMS SINCE   Hypotestosteronemia    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  Dermatology: Skin is warm, dry and supple bilateral lower extremities. Negative for open lesions or macerations.  Vascular: Capillary refill within normal limits.  I am able to faintly palpate DP and PT pulses bilateral.  Patient states that he has known history of PAD and is being managed by  vein and vascular  Neurological: Epicritic and protective threshold diminished bilaterally.   Musculoskeletal Exam: No pedal deformities noted   Assessment: 1.  Chronic lumbar radiculopathy bilateral lower extremities 2.  Diabetes mellitus with peripheral polyneuropathy 3.  Peripheral arterial disease bilateral   Plan of Care:  1. Patient evaluated. 2.  Appointment with  Pedorthist to be molded for diabetic shoes and insoles 3.  Patient has nerve conduction studies on 10/07/2020 as well as a follow-up with neurology on 10/13/2020. 4.  Patient states that within the next 2 weeks he is scheduled for arteriogram with Dr. Delana Meyer, vein and vascular 5.  Patient continues to have severe neuropathy to the bilateral lower extremities complicated by diabetes and lumbar radiculopathy.  He may benefit from increasing his dosage of Lyrica to the maximum daily dose of 600 mg/day.  Currently he takes 150 mg 3 times daily 6.  Return to clinic annually      [name], DPM [facility]  Dr. [name], DPM    [address], [place] [address]                Office [phone]  Fax [phone]

## 2020-09-29 NOTE — Telephone Encounter (Signed)
Placed call to pt to ask provider questions, LMTCB.

## 2020-09-29 NOTE — Telephone Encounter (Signed)
Noted  

## 2020-09-29 NOTE — Telephone Encounter (Signed)
Placed call to pt. Pt states the neurologist at [facility] ordered the nerve conduction study which is on the 28th of this month. Informed pt of provider message. Pt states he saw [facility] and they said he had a clogged artery in his left leg. Pt states Neuro prescribed him gabapentin with his lyrica and I informed pt provider stated Neuro should be giving recommendations on dosage of Lyrica. Pt states he will ask if Neuro will take over prescribing Lyrica.

## 2020-10-05 ENCOUNTER — Ambulatory Visit (INDEPENDENT_AMBULATORY_CARE_PROVIDER_SITE_OTHER): Payer: PPO

## 2020-10-05 ENCOUNTER — Other Ambulatory Visit: Payer: Self-pay

## 2020-10-05 DIAGNOSIS — G629 Polyneuropathy, unspecified: Secondary | ICD-10-CM

## 2020-10-05 DIAGNOSIS — E0843 Diabetes mellitus due to underlying condition with diabetic autonomic (poly)neuropathy: Secondary | ICD-10-CM

## 2020-10-05 NOTE — Progress Notes (Addendum)
Patient presents to the office today for diabetic shoe and insole measuring.  Patient was measured with brannock device to determine size and width for 1 pair of extra depth shoes and foam casted for 3 pair of insoles.   Documentation of medical necessity will be sent to patient's treating diabetic doctor to verify and sign.   Patient's diabetic provider: Dr. Benjamin Stain and insoles will be ordered at that time and patient will be notified for an appointment for fitting when they arrive.   Shoe size (per patient): 11 medium   Brannock measurement:   Patient shoe selection-   1st choice:   Erik Martinez addiction   [id number]  2nd choice:  NB   [id number]  Shoe size ordered: 11 medium

## 2020-10-07 DIAGNOSIS — R202 Paresthesia of skin: Secondary | ICD-10-CM | POA: Diagnosis not present

## 2020-10-07 DIAGNOSIS — R208 Other disturbances of skin sensation: Secondary | ICD-10-CM | POA: Diagnosis not present

## 2020-10-09 ENCOUNTER — Other Ambulatory Visit: Payer: Self-pay | Admitting: Adult Health

## 2020-10-09 ENCOUNTER — Other Ambulatory Visit: Payer: Self-pay | Admitting: Family Medicine

## 2020-10-13 ENCOUNTER — Telehealth: Payer: Self-pay | Admitting: Podiatry

## 2020-10-13 NOTE — Telephone Encounter (Signed)
Shawna from [employer] left message needing some clarification on pts prior auth.  I returned call and she received a prior auth forms for diabetic shoes and inserts but there was no certifying doctors note attached.  I have withdrawn the request because we are waiting on the certifying providers signature and will resubmit when I get that.

## 2020-10-19 ENCOUNTER — Telehealth: Payer: Self-pay | Admitting: Adult Health

## 2020-10-19 DIAGNOSIS — E7800 Pure hypercholesterolemia, unspecified: Secondary | ICD-10-CM | POA: Diagnosis not present

## 2020-10-19 DIAGNOSIS — K429 Umbilical hernia without obstruction or gangrene: Secondary | ICD-10-CM | POA: Diagnosis not present

## 2020-10-19 DIAGNOSIS — E1165 Type 2 diabetes mellitus with hyperglycemia: Secondary | ICD-10-CM | POA: Diagnosis not present

## 2020-10-19 DIAGNOSIS — N529 Male erectile dysfunction, unspecified: Secondary | ICD-10-CM | POA: Diagnosis not present

## 2020-10-19 DIAGNOSIS — E291 Testicular hypofunction: Secondary | ICD-10-CM | POA: Diagnosis not present

## 2020-10-19 DIAGNOSIS — E6609 Other obesity due to excess calories: Secondary | ICD-10-CM | POA: Diagnosis not present

## 2020-10-19 MED ORDER — MELOXICAM 15 MG PO TABS: 15.0000 mg | ORAL_TABLET | Freq: Every day | ORAL | 0 refills | Status: DC

## 2020-10-19 MED ORDER — OMEPRAZOLE 20 MG PO CPDR: 20.0000 mg | DELAYED_RELEASE_CAPSULE | Freq: Every day | ORAL | 0 refills | Status: DC

## 2020-10-19 NOTE — Telephone Encounter (Signed)
Patient needing refills of Meloxicam and Omeprazole, last seen 08/2020. Medication sent in to preferred pharmacy per protocol.     Patient states that the Vania Rea has went from costing $100 monthly to around $500. Informed Patient of coupon cards for this medication. Patient would like a Jardiance coupon card from the office or one printed out for him and placed upfront for pick up.   Please advise

## 2020-10-19 NOTE — Telephone Encounter (Signed)
Coupon placed up front .

## 2020-10-22 ENCOUNTER — Telehealth: Payer: Self-pay | Admitting: Adult Health

## 2020-10-22 ENCOUNTER — Ambulatory Visit: Payer: PPO | Admitting: Endocrinology

## 2020-10-22 DIAGNOSIS — G4700 Insomnia, unspecified: Secondary | ICD-10-CM

## 2020-10-22 MED ORDER — ZOLPIDEM TARTRATE 10 MG PO TABS: 10.0000 mg | ORAL_TABLET | Freq: Every evening | ORAL | 0 refills | Status: DC | PRN

## 2020-10-22 NOTE — Telephone Encounter (Signed)
Patient is out of his zolpidem (AMBIEN) 10 MG tablet and needs a refill.

## 2020-10-22 NOTE — Telephone Encounter (Signed)
Patient calling back in and states he is completely out og this medication.   Please advise

## 2020-10-22 NOTE — Telephone Encounter (Signed)
PDMP reviewed.  Refill for ambien #30 with no refills sent in to pharmacy.

## 2020-10-22 NOTE — Addendum Note (Signed)
Addended by: Alisa Graff on: 10/22/2020 05:03 PM   Modules accepted: Orders

## 2020-10-22 NOTE — Telephone Encounter (Signed)
RX Refill:ambien Last Seen:06-02-20 Last ordered:08-26-20

## 2020-10-23 ENCOUNTER — Ambulatory Visit: Payer: PPO | Admitting: Neurology

## 2020-11-02 ENCOUNTER — Telehealth (INDEPENDENT_AMBULATORY_CARE_PROVIDER_SITE_OTHER): Payer: Self-pay

## 2020-11-02 DIAGNOSIS — N529 Male erectile dysfunction, unspecified: Secondary | ICD-10-CM | POA: Diagnosis not present

## 2020-11-02 DIAGNOSIS — K429 Umbilical hernia without obstruction or gangrene: Secondary | ICD-10-CM | POA: Diagnosis not present

## 2020-11-02 DIAGNOSIS — E7800 Pure hypercholesterolemia, unspecified: Secondary | ICD-10-CM | POA: Diagnosis not present

## 2020-11-02 DIAGNOSIS — E291 Testicular hypofunction: Secondary | ICD-10-CM | POA: Diagnosis not present

## 2020-11-02 DIAGNOSIS — E1165 Type 2 diabetes mellitus with hyperglycemia: Secondary | ICD-10-CM | POA: Diagnosis not present

## 2020-11-02 DIAGNOSIS — E6609 Other obesity due to excess calories: Secondary | ICD-10-CM | POA: Diagnosis not present

## 2020-11-02 NOTE — Telephone Encounter (Signed)
Spoke with the patient and he is scheduled with Dr.Schnier for a LLE angio on 11/17/20 with 9:00 am arrival time to the MM. Pre-procedure instructions were discussed and will be mailed.

## 2020-11-03 DIAGNOSIS — M24662 Ankylosis, left knee: Secondary | ICD-10-CM | POA: Diagnosis not present

## 2020-11-03 DIAGNOSIS — T84033D Mechanical loosening of internal left knee prosthetic joint, subsequent encounter: Secondary | ICD-10-CM | POA: Diagnosis not present

## 2020-11-03 DIAGNOSIS — Z96652 Presence of left artificial knee joint: Secondary | ICD-10-CM | POA: Diagnosis not present

## 2020-11-07 ENCOUNTER — Other Ambulatory Visit: Payer: Self-pay | Admitting: Adult Health

## 2020-11-11 ENCOUNTER — Telehealth (INDEPENDENT_AMBULATORY_CARE_PROVIDER_SITE_OTHER): Payer: PPO | Admitting: Family Medicine

## 2020-11-11 ENCOUNTER — Encounter: Payer: Self-pay | Admitting: Family Medicine

## 2020-11-11 ENCOUNTER — Other Ambulatory Visit: Payer: Self-pay

## 2020-11-11 DIAGNOSIS — G4700 Insomnia, unspecified: Secondary | ICD-10-CM

## 2020-11-11 DIAGNOSIS — G629 Polyneuropathy, unspecified: Secondary | ICD-10-CM

## 2020-11-11 DIAGNOSIS — M25542 Pain in joints of left hand: Secondary | ICD-10-CM

## 2020-11-11 DIAGNOSIS — I739 Peripheral vascular disease, unspecified: Secondary | ICD-10-CM

## 2020-11-11 DIAGNOSIS — M25541 Pain in joints of right hand: Secondary | ICD-10-CM

## 2020-11-11 MED ORDER — ZOLPIDEM TARTRATE 10 MG PO TABS: 10.0000 mg | ORAL_TABLET | Freq: Every evening | ORAL | 0 refills | Status: DC | PRN

## 2020-11-11 MED ORDER — DICLOFENAC SODIUM 75 MG PO TBEC: 75.0000 mg | DELAYED_RELEASE_TABLET | Freq: Two times a day (BID) | ORAL | 2 refills | Status: DC | PRN

## 2020-11-11 NOTE — Assessment & Plan Note (Signed)
Chronic issue.  Stable at this time.  We will refill his Ambien.

## 2020-11-11 NOTE — Assessment & Plan Note (Signed)
Chronic issue.  We will restart his diclofenac.  He will discontinue the meloxicam.  He will continue on his PPI.

## 2020-11-11 NOTE — Progress Notes (Signed)
Virtual Visit via telephone Note  This visit type was conducted due to national recommendations for restrictions regarding the COVID-19 pandemic (e.g. social distancing).  This format is felt to be most appropriate for this patient at this time.  All issues noted in this document were discussed and addressed.  No physical exam was performed (except for noted visual exam findings with Video Visits).   I connected with Erik Martinez today at  3:15 PM EDT by telephone and verified that I am speaking with the correct person using two identifiers. Location patient: work Location provider: work  Persons participating in the virtual visit: patient, provider  I discussed the limitations, risks, security and privacy concerns of performing an evaluation and management service by telephone and the availability of in person appointments. I also discussed with the patient that there may be a patient responsible charge related to this service. The patient expressed understanding and agreed to proceed.  Interactive audio and video telecommunications were attempted between this provider and patient, however failed, due to patient having technical difficulties OR patient did not have access to video capability.  We continued and completed visit with audio only.   Reason for visit: f/u  HPI: Neuropathy: The patient saw neurology.  He had a nerve conduction study and they placed him on gabapentin.  He has tapered up to 600 mg twice daily of the gabapentin.  He has continued on Lyrica 450 mg 3 times daily.  He notes in the past he has been on Lyrica 200 mg 3 times daily.  He notes neurology advised him the increase in Lyrica would be up to me.  He notes he has to space the gabapentin out between his Lyrica doses or it causes vision issues.  He notes his feet are burning and numb at the end of the day.  He reports he has gotten his A1c down to 6.3.  Arthritis: He notes the meloxicam has not been beneficial.  He was  on diclofenac in the past and notes it worked better.    Insomnia: The patient has been taking Ambien.  This allows him to get about 6 hours of sleep nightly.  He notes no drowsiness with this.  PAD: He notes he is having lower extremity angiography for this in the next week or so.   ROS: See pertinent positives and negatives per HPI.  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   Nocturia    PONV (postoperative nausea and vomiting)    "difficult to put me to sleep"   Spinal headache    Urine incontinence     Past Surgical History:  Procedure Laterality Date   BACK SURGERY     2 rods and 4 screws artificial disc   INTERSTIM IMPLANT  PLACEMENT  2007   INTERSTIM IMPLANT REVISION N/A 08/17/2012   Procedure: REPLACMENT OF IPG PLUS REPLACE LEAD OF INTERSTIM IMPLANT ;  Surgeon: [name], MD;  Location: [facility];  Service: Urology;  Laterality: N/A;   JOINT REPLACEMENT     Lt knee and Lt hip   LUMBAR DISC SURGERY  2005   L5   LUMBAR FUSION  X2  2006  &  2007   OPEN DEBRIDEMENT LEFT TOTAL KNEE (SCAR, BONEY GRAOWTH)/ REMOVAL OLD SUTURES  05-01-2007   PARTIAL HIP ARTHROPLASTY Left 2001   x2   SHOULDER OPEN ROTATOR CUFF REPAIR Left 2000   TOTAL HIP ARTHROPLASTY Left 2002   TOTAL KNEE ARTHROPLASTY Left    PARTIAL LEFT KNEE REPLACEMENT PRIOR TO THIS   TOTAL KNEE REVISION Left 2008    TRIGGER FINGER RELEASE Bilateral    SEVERAL FINGERS   TRIGGER FINGER RELEASE Right 06/18/2014   Procedure: RELEASE TRIGGER FINGER/A-1 PULLEY;  Surgeon: [name], MD;  Location: [facility];  Service: Orthopedics;  Laterality: Right;    Family History  Problem Relation Age of Onset   Diabetes Mother    Arthritis Mother    Cancer Mother    Hyperlipidemia Mother    Stroke Mother    Arthritis Father    Cancer Father     SOCIAL HX: Non-smoker   Current Outpatient Medications:    atorvastatin (LIPITOR) 40 MG tablet, Take 40 mg by mouth daily., Disp: , Rfl:    diclofenac (VOLTAREN) 75 MG EC tablet, Take 1 tablet (75 mg total) by mouth 2 (two) times daily as needed., Disp: 60 tablet, Rfl: 2   empagliflozin (JARDIANCE) 25 MG TABS tablet, Take 1 tablet (25 mg total) by mouth daily., Disp: 90 tablet, Rfl: 1   gabapentin (NEURONTIN) 600 MG tablet, Take 600 mg by mouth 3 (three) times daily as needed., Disp: , Rfl:    meloxicam (MOBIC) 15 MG tablet, Take 1 tablet (15 mg total) by mouth daily., Disp: 30 tablet, Rfl: 0   metFORMIN (GLUCOPHAGE) 1000 MG tablet, Take 1,000 mg by mouth 2 (two) times daily., Disp: , Rfl:    Multiple Vitamins-Minerals (PX COMPLETE SENIOR MULTIVITS) TABS, Take by mouth., Disp: , Rfl:    omeprazole (PRILOSEC) 20 MG capsule, Take 1 capsule (20 mg total) by mouth daily., Disp: 90 capsule, Rfl: 0   pregabalin (LYRICA) 150 MG capsule, Take 1 capsule (150 mg total) by mouth in the morning, at noon, and at bedtime., Disp: 90 capsule, Rfl: 3   tadalafil (CIALIS) 20 MG tablet, Take 20 mg by mouth daily as needed., Disp: , Rfl:    [START ON 11/19/2020] zolpidem (AMBIEN) 10 MG tablet, Take 1 tablet (10 mg total) by mouth at bedtime as needed for sleep., Disp: 30 tablet, Rfl: 0  EXAM: This was a telephone visit and thus no exam was completed.  ASSESSMENT AND PLAN:  Discussed the following assessment and plan:  Problem List Items Addressed This Visit     Arthralgia     Chronic issue.  We will restart his diclofenac.  He will discontinue the meloxicam.  He will continue on his PPI.      Relevant Medications   diclofenac (VOLTAREN) 75 MG EC tablet   Insomnia    Chronic issue.  Stable at this time.  We will refill his Ambien.      Relevant Medications   zolpidem (AMBIEN) 10 MG tablet (Start on 11/19/2020)   Neuropathy  Continues to improve with his medications though continues to have some symptoms.  I discussed that he is already on max dose Lyrica so I would have to do some research to figure out if 200 mg 3 times a day is an acceptable dosage.  Gabapentin is managed by his neurologist.  They are aware that he is on the Lyrica and the gabapentin.      PAD (peripheral artery disease) ([place])    He will continue to see vascular surgery.      The patient is going to bring [place] a copy of his labs from his endocrinologist.  Return in about 3 months (around 02/11/2021) for With Laverna Peace or a new PCP.   I discussed the assessment and treatment plan with the patient. The patient was provided an opportunity to ask questions and all were answered. The patient agreed with the plan and demonstrated an understanding of the instructions.   The patient was advised to call back or seek an in-person evaluation if the symptoms worsen or if the condition fails to improve as anticipated.  The patient did ask about me taking over as his PCP.  I did inform him that I him not currently taking new patients and do not have the capability of taking on additional patients at this time.  I discussed that we did not know when his PCP would be back in the office.  I provided 13 minutes of non-face-to-face time during this encounter.   Tommi Rumps, MD

## 2020-11-11 NOTE — Assessment & Plan Note (Signed)
He will continue to see vascular surgery.

## 2020-11-11 NOTE — Assessment & Plan Note (Signed)
Continues to improve with his medications though continues to have some symptoms.  I discussed that he is already on max dose Lyrica so I would have to do some research to figure out if 200 mg 3 times a day is an acceptable dosage.  Gabapentin is managed by his neurologist.  They are aware that he is on the Lyrica and the gabapentin.

## 2020-11-12 ENCOUNTER — Ambulatory Visit: Payer: PPO | Admitting: Adult Health

## 2020-11-17 ENCOUNTER — Encounter
Admission: RE | Disposition: A | Discharge status: Home / Self Care | Payer: Self-pay | Source: Home / Self Care | Attending: Vascular Surgery

## 2020-11-17 ENCOUNTER — Ambulatory Visit
Admission: RE | Admit: 2020-11-17 | Discharge: 2020-11-17 | Disposition: A | Discharge status: Home / Self Care | Payer: PPO | Attending: Vascular Surgery | Admitting: Vascular Surgery

## 2020-11-17 ENCOUNTER — Other Ambulatory Visit (INDEPENDENT_AMBULATORY_CARE_PROVIDER_SITE_OTHER): Payer: Self-pay | Admitting: Nurse Practitioner

## 2020-11-17 ENCOUNTER — Encounter: Payer: Self-pay | Admitting: Vascular Surgery

## 2020-11-17 DIAGNOSIS — E1151 Type 2 diabetes mellitus with diabetic peripheral angiopathy without gangrene: Secondary | ICD-10-CM | POA: Diagnosis not present

## 2020-11-17 DIAGNOSIS — I70222 Atherosclerosis of native arteries of extremities with rest pain, left leg: Secondary | ICD-10-CM | POA: Insufficient documentation

## 2020-11-17 DIAGNOSIS — E7800 Pure hypercholesterolemia, unspecified: Secondary | ICD-10-CM | POA: Diagnosis not present

## 2020-11-17 DIAGNOSIS — E6609 Other obesity due to excess calories: Secondary | ICD-10-CM | POA: Diagnosis not present

## 2020-11-17 DIAGNOSIS — K219 Gastro-esophageal reflux disease without esophagitis: Secondary | ICD-10-CM | POA: Diagnosis not present

## 2020-11-17 DIAGNOSIS — E1165 Type 2 diabetes mellitus with hyperglycemia: Secondary | ICD-10-CM | POA: Diagnosis not present

## 2020-11-17 DIAGNOSIS — E1142 Type 2 diabetes mellitus with diabetic polyneuropathy: Secondary | ICD-10-CM | POA: Diagnosis not present

## 2020-11-17 DIAGNOSIS — E782 Mixed hyperlipidemia: Secondary | ICD-10-CM | POA: Diagnosis not present

## 2020-11-17 DIAGNOSIS — N529 Male erectile dysfunction, unspecified: Secondary | ICD-10-CM | POA: Diagnosis not present

## 2020-11-17 DIAGNOSIS — E291 Testicular hypofunction: Secondary | ICD-10-CM | POA: Diagnosis not present

## 2020-11-17 DIAGNOSIS — I70213 Atherosclerosis of native arteries of extremities with intermittent claudication, bilateral legs: Secondary | ICD-10-CM

## 2020-11-17 DIAGNOSIS — I70219 Atherosclerosis of native arteries of extremities with intermittent claudication, unspecified extremity: Secondary | ICD-10-CM

## 2020-11-17 DIAGNOSIS — K429 Umbilical hernia without obstruction or gangrene: Secondary | ICD-10-CM | POA: Diagnosis not present

## 2020-11-17 HISTORY — PX: LOWER EXTREMITY ANGIOGRAPHY: CATH118251

## 2020-11-17 LAB — BUN: BUN: 25 mg/dL — ABNORMAL HIGH (ref 8–23)

## 2020-11-17 LAB — CREATININE, SERUM
Creatinine, Ser: 1.14 mg/dL (ref 0.61–1.24)
GFR, Estimated: >60 mL/min (ref >60)

## 2020-11-17 LAB — GLUCOSE, CAPILLARY: Glucose-Capillary: 126 mg/dL — ABNORMAL HIGH (ref 70–99)

## 2020-11-17 SURGERY — LOWER EXTREMITY ANGIOGRAPHY
Anesthesia: Moderate Sedation | Site: Leg Lower | Laterality: Left

## 2020-11-17 MED ORDER — CEFAZOLIN SODIUM-DEXTROSE 2-4 GM/100ML-% IV SOLN: 2.0000 g | Freq: Once | INTRAVENOUS | Status: DC

## 2020-11-17 MED ORDER — HEPARIN SODIUM (PORCINE) 1000 UNIT/ML IJ SOLN
INTRAMUSCULAR | Status: DC | PRN
  Administered 2020-11-17: 5000 [IU] via INTRAVENOUS

## 2020-11-17 MED ORDER — OXYCODONE HCL 5 MG PO TABS: 5.0000 mg | ORAL_TABLET | ORAL | Status: DC | PRN

## 2020-11-17 MED ORDER — SODIUM CHLORIDE 0.9 % IV SOLN
INTRAVENOUS | Status: DC
  Administered 2020-11-17: 1000 mL via INTRAVENOUS

## 2020-11-17 MED ORDER — CLOPIDOGREL BISULFATE 75 MG PO TABS: 75.0000 mg | ORAL_TABLET | Freq: Every day | ORAL | 11 refills | Status: DC

## 2020-11-17 MED ORDER — ONDANSETRON HCL 4 MG/2ML IJ SOLN: 4.0000 mg | Freq: Four times a day (QID) | INTRAMUSCULAR | Status: DC | PRN

## 2020-11-17 MED ORDER — IODIXANOL 320 MG/ML IV SOLN
INTRAVENOUS | Status: DC | PRN
  Administered 2020-11-17: 60 mL via INTRA_ARTERIAL

## 2020-11-17 MED ORDER — LABETALOL HCL 5 MG/ML IV SOLN: 10.0000 mg | INTRAVENOUS | Status: DC | PRN

## 2020-11-17 MED ORDER — SODIUM CHLORIDE 0.9% FLUSH: 3.0000 mL | Freq: Two times a day (BID) | INTRAVENOUS | Status: DC

## 2020-11-17 MED ORDER — METHYLPREDNISOLONE SODIUM SUCC 125 MG IJ SOLR: 125.0000 mg | Freq: Once | INTRAMUSCULAR | Status: DC | PRN

## 2020-11-17 MED ORDER — SODIUM CHLORIDE 0.9% FLUSH: 3.0000 mL | INTRAVENOUS | Status: DC | PRN

## 2020-11-17 MED ORDER — ASPIRIN EC 325 MG PO TBEC
DELAYED_RELEASE_TABLET | ORAL | Status: AC
  Administered 2020-11-17: 325 mg
  Filled 2020-11-17: qty 1

## 2020-11-17 MED ORDER — CLOPIDOGREL BISULFATE 75 MG PO TABS
ORAL_TABLET | ORAL | Status: AC
  Administered 2020-11-17: 300 mg via ORAL
  Filled 2020-11-17: qty 4

## 2020-11-17 MED ORDER — HYDRALAZINE HCL 20 MG/ML IJ SOLN: 5.0000 mg | INTRAMUSCULAR | Status: DC | PRN

## 2020-11-17 MED ORDER — SODIUM CHLORIDE 0.9 % IV SOLN: 250.0000 mL | INTRAVENOUS | Status: DC | PRN

## 2020-11-17 MED ORDER — MIDAZOLAM HCL 2 MG/2ML IJ SOLN
INTRAMUSCULAR | Status: DC | PRN
  Administered 2020-11-17 (×6): 1 mg via INTRAVENOUS

## 2020-11-17 MED ORDER — FENTANYL CITRATE PF 50 MCG/ML IJ SOSY: 12.5000 ug | PREFILLED_SYRINGE | Freq: Once | INTRAMUSCULAR | Status: DC | PRN

## 2020-11-17 MED ORDER — SODIUM CHLORIDE 0.9 % IV SOLN: INTRAVENOUS | Status: DC

## 2020-11-17 MED ORDER — FAMOTIDINE 20 MG PO TABS: 40.0000 mg | ORAL_TABLET | Freq: Once | ORAL | Status: DC | PRN

## 2020-11-17 MED ORDER — DIPHENHYDRAMINE HCL 50 MG/ML IJ SOLN
INTRAMUSCULAR | Status: DC | PRN
  Administered 2020-11-17: 25 mg via INTRAVENOUS

## 2020-11-17 MED ORDER — MORPHINE SULFATE (PF) 4 MG/ML IV SOLN: 2.0000 mg | INTRAVENOUS | Status: DC | PRN

## 2020-11-17 MED ORDER — FENTANYL CITRATE PF 50 MCG/ML IJ SOSY
PREFILLED_SYRINGE | INTRAMUSCULAR | Status: AC
  Filled 2020-11-17: qty 1

## 2020-11-17 MED ORDER — FENTANYL CITRATE (PF) 100 MCG/2ML IJ SOLN
INTRAMUSCULAR | Status: AC
  Filled 2020-11-17: qty 2

## 2020-11-17 MED ORDER — DIPHENHYDRAMINE HCL 50 MG/ML IJ SOLN: 50.0000 mg | Freq: Once | INTRAMUSCULAR | Status: DC | PRN

## 2020-11-17 MED ORDER — MIDAZOLAM HCL 5 MG/5ML IJ SOLN
INTRAMUSCULAR | Status: AC
  Filled 2020-11-17: qty 5

## 2020-11-17 MED ORDER — HEPARIN SODIUM (PORCINE) 1000 UNIT/ML IJ SOLN
INTRAMUSCULAR | Status: AC
  Filled 2020-11-17: qty 1

## 2020-11-17 MED ORDER — CLOPIDOGREL BISULFATE 300 MG PO TABS: 300.0000 mg | ORAL_TABLET | ORAL | Status: AC

## 2020-11-17 MED ORDER — PANTOPRAZOLE SODIUM 40 MG PO TBEC: 40.0000 mg | DELAYED_RELEASE_TABLET | Freq: Every day | ORAL | 3 refills | Status: AC

## 2020-11-17 MED ORDER — ACETAMINOPHEN 325 MG PO TABS: 650.0000 mg | ORAL_TABLET | ORAL | Status: DC | PRN

## 2020-11-17 MED ORDER — FENTANYL CITRATE (PF) 100 MCG/2ML IJ SOLN
INTRAMUSCULAR | Status: DC | PRN
  Administered 2020-11-17 (×3): 25 ug via INTRAVENOUS
  Administered 2020-11-17: 50 ug via INTRAVENOUS
  Administered 2020-11-17: 25 ug via INTRAVENOUS

## 2020-11-17 MED ORDER — DIPHENHYDRAMINE HCL 50 MG/ML IJ SOLN
INTRAMUSCULAR | Status: AC
  Filled 2020-11-17: qty 1

## 2020-11-17 MED ORDER — MIDAZOLAM HCL 2 MG/2ML IJ SOLN
INTRAMUSCULAR | Status: AC
  Filled 2020-11-17: qty 2

## 2020-11-17 MED ORDER — CEFAZOLIN SODIUM-DEXTROSE 2-4 GM/100ML-% IV SOLN
INTRAVENOUS | Status: AC
  Filled 2020-11-17: qty 100

## 2020-11-17 MED ORDER — ASPIRIN 325 MG PO TABS
325.0000 mg | ORAL_TABLET | ORAL | Status: DC
  Filled 2020-11-17: qty 1

## 2020-11-17 MED ORDER — ASPIRIN EC 81 MG PO TBEC: 81.0000 mg | DELAYED_RELEASE_TABLET | Freq: Every day | ORAL | 2 refills | Status: DC

## 2020-11-17 MED ORDER — CEFAZOLIN SODIUM-DEXTROSE 1-4 GM/50ML-% IV SOLN
INTRAVENOUS | Status: DC | PRN
  Administered 2020-11-17: 2 g via INTRAVENOUS

## 2020-11-17 MED ORDER — MIDAZOLAM HCL 2 MG/ML PO SYRP: 8.0000 mg | ORAL_SOLUTION | Freq: Once | ORAL | Status: DC | PRN

## 2020-11-17 SURGICAL SUPPLY — 18 items
BALLN LUTONIX DCB 5X60X130 (BALLOONS) ×2
BALLN ULTRASCORE 4X40X130 (BALLOONS) ×2
BALLOON LUTONIX DCB 5X60X130 (BALLOONS) ×1 IMPLANT
BALLOON ULTRASCORE 4X40X130 (BALLOONS) ×1 IMPLANT
CANNULA 5F STIFF (CANNULA) ×2 IMPLANT
CATH ANGIO 5F PIGTAIL 65CM (CATHETERS) ×2 IMPLANT
CATH VERT 5X100 (CATHETERS) ×2 IMPLANT
COVER PROBE U/S 5X48 (MISCELLANEOUS) ×2 IMPLANT
DEVICE STARCLOSE SE CLOSURE (Vascular Products) ×2 IMPLANT
GLIDEWIRE ADV .035X260CM (WIRE) ×2 IMPLANT
KIT ENCORE 26 ADVANTAGE (KITS) ×2 IMPLANT
PACK ANGIOGRAPHY (CUSTOM PROCEDURE TRAY) ×2 IMPLANT
SHEATH BRITE TIP 5FRX11 (SHEATH) ×2 IMPLANT
SHEATH RAABE 6FRX70 (SHEATH) ×2 IMPLANT
STENT LIFESTENT 5F 6X60X135 (Permanent Stent) ×2 IMPLANT
SYR MEDRAD MARK 7 150ML (SYRINGE) ×2 IMPLANT
TUBING CONTRAST HIGH PRESS 72 (TUBING) ×4 IMPLANT
WIRE GUIDERIGHT .035X150 (WIRE) ×2 IMPLANT

## 2020-11-17 NOTE — Progress Notes (Signed)
Can you let the patient know that I reviewed the lyrica dosing and the combination of the lyrica with the gabapentin? Based on my review I think it would be best if the neurologist weighed in on whether or not increasing the lyrica would be an acceptable option. He is at the maximum recommended dosing for the lyrica for diabetic neuropathy and given that he is also on gabapentin it would be best for the specialist to determine if increasing the dose is an option. I would suggest that he contact the neurologist to get their input on this.

## 2020-11-17 NOTE — Interval H&P Note (Signed)
History and Physical Interval Note:  11/17/2020 9:35 AM  Erik Martinez  has presented today for surgery, with the diagnosis of LLE Angiography   ASO with claudication   BARD Rep  cc: Judi Cong.  The various methods of treatment have been discussed with the patient and family. After consideration of risks, benefits and other options for treatment, the patient has consented to  Procedure(s): LOWER EXTREMITY ANGIOGRAPHY (Left) as a surgical intervention.  The patient's history has been reviewed, patient examined, no change in status, stable for surgery.  I have reviewed the patient's chart and labs.  Questions were answered to the patient's satisfaction.     [name]

## 2020-11-17 NOTE — H&P (Signed)
@LOGO @   MRN : [id number]  Erik Martinez is a 65 y.o. (04/03/1955) male who presents with chief complaint of leg pain.  History of Present Illness:   The patient presents to [facility] today for treatment of his atherosclerotic occlusive disease with lifestyle limiting claudication and rest pain symptoms.  Patient notes the pain is always associated with activity and is very consistent day today. Typically, the pain occurs at less than one block, progress is as activity continues to the point that the patient must stop walking. Resting including standing still for several minutes allowed resumption of the activity and the ability to walk a similar distance before stopping again. Uneven terrain and inclined shorten the distance. The pain has been progressive over the past several years. The patient states the inability to walk is now having a profound negative impact on quality of life and daily activities.   The patient denies rest pain or dangling of an extremity off the side of the bed during the night for relief. No open wounds or sores at this time. No prior interventions or surgeries.   No history of back problems or DJD of the lumbar sacral spine.    The patient denies changes in claudication symptoms or new rest pain symptoms.  No new ulcers or wounds of the foot.   The patient's blood pressure has been stable and relatively well controlled. The patient denies amaurosis fugax or recent TIA symptoms. There are no recent neurological changes noted. The patient denies history of DVT, PE or superficial thrombophlebitis. The patient denies recent episodes of angina or shortness of breath.     ABI's dated 08/31/2020  Rt=[place] (biphasic) and Lt=[place] (biphaisic)  Current Meds  Medication Sig   acetaminophen (TYLENOL) 500 MG tablet Take 1,500 mg by mouth in the morning and at bedtime.   atorvastatin (LIPITOR) 20 MG tablet Take 20 mg by mouth at bedtime.   diclofenac  (VOLTAREN) 75 MG EC tablet Take 1 tablet (75 mg total) by mouth 2 (two) times daily as needed. (Patient taking differently: Take 75 mg by mouth in the morning and at bedtime.)   empagliflozin (JARDIANCE) 25 MG TABS tablet Take 1 tablet (25 mg total) by mouth daily.   gabapentin (NEURONTIN) 600 MG tablet Take 600 mg by mouth See admin instructions. Take 1 tablet (600 mg) by mouth 2-3 times daily   metFORMIN (GLUCOPHAGE) 1000 MG tablet Take 1,000 mg by mouth 2 (two) times daily.   Multiple Vitamin (MULTIVITAMIN WITH MINERALS) TABS tablet Take 1 tablet by mouth in the morning.   omeprazole (PRILOSEC) 20 MG capsule Take 1 capsule (20 mg total) by mouth daily.   pregabalin (LYRICA) 150 MG capsule Take 1 capsule (150 mg total) by mouth in the morning, at noon, and at bedtime.   Semaglutide,0.25 or 0.5MG /DOS, 2 MG/1.5ML SOPN Inject 0.5 mg into the skin every Monday.   tadalafil (CIALIS) 20 MG tablet Take 20 mg by mouth daily as needed for erectile dysfunction.   TESTOSTERONE CYPIONATE IM Inject 1 Dose into the muscle every 14 (fourteen) days.   [START ON 11/19/2020] zolpidem (AMBIEN) 10 MG tablet Take 1 tablet (10 mg total) by mouth at bedtime as needed for sleep. (Patient taking differently: Take 10 mg by mouth at bedtime.)    Past Medical History:  Diagnosis Date   Anxiety    Arthritis    Avascular necrosis of bones of both hips ([place])    Bipolar disorder ([place])  Bladder spasms    Chronic pain of left knee    Chronic pain syndrome    PAIN CLINIC   Cocaine use disorder, moderate, dependence ([place]) 09/09/2015   DDD (degenerative disc disease)    SPINE   Depression    Diabetes mellitus without complication (HCC)    Frequency-urgency syndrome    GERD (gastroesophageal reflux disease)    Hepatitis    Hepatitis    History of hepatitis B    1983  TX'D--  NO ISSUES OR SYMPTOMS SINCE   Hypotestosteronemia    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   Nocturia    PONV (postoperative nausea and  vomiting)    "difficult to put me to sleep"   Spinal headache    Urine incontinence     Past Surgical History:  Procedure Laterality Date   BACK SURGERY     2 rods and 4 screws artificial disc   INTERSTIM IMPLANT PLACEMENT  2007   INTERSTIM IMPLANT REVISION N/A 08/17/2012   Procedure: REPLACMENT OF IPG PLUS REPLACE LEAD OF INTERSTIM IMPLANT ;  Surgeon: [name], MD;  Location: [facility];  Service: Urology;  Laterality: N/A;   JOINT REPLACEMENT     Lt knee and Lt hip   LUMBAR DISC SURGERY  2005   L5   LUMBAR FUSION  X2  2006  &  2007   OPEN DEBRIDEMENT LEFT TOTAL KNEE (SCAR, BONEY GRAOWTH)/ REMOVAL OLD SUTURES  05-01-2007   PARTIAL HIP ARTHROPLASTY Left 2001   x2   SHOULDER OPEN ROTATOR CUFF REPAIR Left 2000   TOTAL HIP ARTHROPLASTY Left 2002   TOTAL KNEE ARTHROPLASTY Left    PARTIAL LEFT KNEE REPLACEMENT PRIOR TO THIS   TOTAL KNEE REVISION Left 2008   TRIGGER FINGER RELEASE Bilateral    SEVERAL FINGERS   TRIGGER FINGER RELEASE Right 06/18/2014   Procedure: RELEASE TRIGGER FINGER/A-1 PULLEY;  Surgeon: [name], MD;  Location: [facility];  Service: Orthopedics;  Laterality: Right;    Social History Social History   Tobacco Use   Smoking status: Never   Smokeless tobacco: Never  Vaping Use   Vaping Use: Never used  Substance Use Topics   Alcohol use: Not Currently    Alcohol/week: 0.0 - 2.0 standard drinks   Drug use: Not Currently    Types: Cocaine    Comment: pt states its been years since he used cocaine    Family History Family History  Problem Relation Age of Onset   Diabetes Mother    Arthritis Mother    Cancer Mother    Hyperlipidemia Mother    Stroke Mother    Arthritis Father    Cancer Father     Allergies  Allergen Reactions   Suboxone [Buprenorphine Hcl-Naloxone Hcl] Nausea And Vomiting   Tape Other (See Comments)    [place] *Paper Tape is ok    Codeine Rash, Itching and Nausea Only   Morphine And Related Itching and  Rash   Other Rash    Telemetry electrodes     REVIEW OF SYSTEMS (Negative unless checked)  Constitutional: [] Weight loss  [] Fever  [] Chills Cardiac: [] Chest pain   [] Chest pressure   [] Palpitations   [] Shortness of breath when laying flat   [] Shortness of breath with exertion. Vascular:  [x] Pain in legs with walking   [x] Pain in legs at rest  [] History of DVT   [] Phlebitis   [] Swelling in legs   [] Varicose veins   [] Non-healing ulcers Pulmonary:   [] Uses  home oxygen   [] Productive cough   [] Hemoptysis   [] Wheeze  [] COPD   [] Asthma Neurologic:  [] Dizziness   [] Seizures   [] History of stroke   [] History of TIA  [] Aphasia   [] Vissual changes   [] Weakness or numbness in arm   [] Weakness or numbness in leg Musculoskeletal:   [] Joint swelling   [] Joint pain   [] Low back pain Hematologic:  [] Easy bruising  [] Easy bleeding   [] Hypercoagulable state   [] Anemic Gastrointestinal:  [] Diarrhea   [] Vomiting  [x] Gastroesophageal reflux/heartburn   [] Difficulty swallowing. Genitourinary:  [] Chronic kidney disease   [] Difficult urination  [] Frequent urination   [] Blood in urine Skin:  [] Rashes   [] Ulcers  Psychological:  [] History of anxiety   []  History of major depression.  Physical Examination  Vitals:   11/17/20 0921  BP: 135/86  Pulse: 70  Resp: 15  Temp: 98.3 F (36.8 C)  TempSrc: Oral  SpO2: 95%  Weight: 90.3 kg  Height: 5\' 10"  (1.778 m)   Body mass index is 28.55 kg/m. Gen: WD/WN, NAD Head: [place]/AT, No temporalis wasting.  Ear/Nose/Throat: Hearing grossly intact, nares w/o erythema or drainage Eyes: PER, EOMI, sclera nonicteric.  Neck: Supple, no masses.  No bruit or JVD.  Pulmonary:  Good air movement, no audible wheezing, no use of accessory muscles.  Cardiac: RRR, normal S1, S2, no Murmurs. Vascular:   No ulcers noted with greater than 3-second capillary refill Vessel Right Left  Radial Palpable Palpable  PT Not Palpable Not Palpable  DP Not Palpable Not Palpable   Gastrointestinal: soft, non-distended. No guarding/no peritoneal signs.  Musculoskeletal: M/S 5/5 throughout.  No visible deformity.  Neurologic: CN 2-12 intact. Pain and light touch intact in extremities.  Symmetrical.  Speech is fluent. Motor exam as listed above. Psychiatric: Judgment intact, Mood & affect appropriate for pt's clinical situation. Dermatologic: No rashes or ulcers noted.  No changes consistent with cellulitis.   CBC Lab Results  Component Value Date   WBC 5.0 06/19/2020   HGB 13.4 (A) 06/19/2020   HCT 40 (A) 06/19/2020   MCV 88.8 11/22/2017   PLT 183 06/19/2020    BMET    Component Value Date/Time   NA 139 06/19/2020 0000   NA 142 05/23/2013 0329   K 4.7 06/19/2020 0000   K 3.8 05/23/2013 0329   CL 95 (A) 06/19/2020 0000   CL 109 (H) 05/23/2013 0329   CO2 24 (A) 06/19/2020 0000   CO2 27 05/23/2013 0329   GLUCOSE 142 (H) 11/22/2017 0821   GLUCOSE 151 (H) 05/23/2013 0329   BUN 27 (A) 06/19/2020 0000   BUN 5 (L) 05/23/2013 0329   CREATININE 1.11 11/22/2017 0821   CREATININE 0.98 05/27/2013 0301   CALCIUM 9.5 06/19/2020 0000   CALCIUM 8.5 05/23/2013 0329   GFRNONAA >60 12/04/2016 0052   GFRNONAA >60 05/27/2013 0301   GFRAA >60 12/04/2016 0052   GFRAA >60 05/27/2013 0301   CrCl cannot be calculated (Patient's most recent lab result is older than the maximum 21 days allowed.).  COAG Lab Results  Component Value Date   INR 0.95 10/17/2016   INR 1.04 07/15/2016   INR 1.0 04/30/2007    Radiology No results found.   Assessment/Plan 1. Atherosclerosis of native artery of both lower extremities with intermittent claudication (HCC) Recommend:   The patient has evidence of severe atherosclerotic changes of both lower extremities with rest pain that is associated with preulcerative changes and impending tissue loss of the left foot.  This  represents a limb threatening ischemia and places the patient at the risk for left limb loss.   Patient should  undergo angiography of the left lower extremities with the hope for intervention for limb salvage.  The risks and benefits as well as the alternative therapies was discussed in detail with the patient.  All questions were answered.  Patient agrees to proceed with left angiography.   The patient will follow up with me in the office after the procedure.    2. Type 2 diabetes mellitus with diabetic polyneuropathy, without long-term current use of insulin (HCC) Continue hypoglycemic medications as already ordered, these medications have been reviewed and there are no changes at this time.   Hgb A1C to be monitored as already arranged by primary service    3. Mixed hyperlipidemia Continue statin as ordered and reviewed, no changes at this time    4. Gastroesophageal reflux disease without esophagitis Continue PPI as already ordered, this medication has been reviewed and there are no changes at this time.   Avoidence of caffeine and alcohol   Moderate elevation of the head of the bed     [name], MD  11/17/2020 9:29 AM

## 2020-11-17 NOTE — Op Note (Signed)
[facility]  Percutaneous Study/Intervention Procedural Note   Date of Surgery: 11/17/2020  Surgeon:  Katha Cabal, MD.  Pre-operative Diagnosis: Atherosclerotic occlusive disease left lower extremity with rest pain  Post-operative diagnosis:  Same  Procedure(s) Performed:             1.  Introduction catheter into left lower extremity 3rd order catheter placement              2.    Contrast injection left lower extremity for distal runoff             3.  Percutaneous transluminal angioplasty and stent placement left popliteal artery.              4.  Star close closure right common femoral arteriotomy  Anesthesia: Conscious sedation was administered under my direct supervision by the interventional radiology RN. IV Versed plus fentanyl were utilized. Continuous ECG, pulse oximetry and blood pressure was monitored throughout the entire procedure.  Conscious sedation was for a total of 58 minutes and 59 seconds.  Sheath: 6 [place] Rabie right common femoral retrograde  Contrast: 60 cc  Fluoroscopy Time: 6.4 minutes  Indications:  Erik Martinez presents with rest pain of the left lower extremity.  Noninvasive studies as well as physical examination demonstrate significant arterial occlusive disease.  This places the patient at risk for limb loss.  Angiography with intervention is recommended.  The risks and benefits are reviewed all questions answered patient agrees to proceed.  Procedure:  Erik Martinez is a 65 y.o. y.o. male who was identified and appropriate procedural time out was performed.  The patient was then placed supine on the table and prepped and draped in the usual sterile fashion.    Ultrasound was placed in the sterile sleeve and the right groin was evaluated the right common femoral artery was echolucent and pulsatile indicating patency.  Image was recorded for the permanent record and under real-time visualization a microneedle was inserted  into the common femoral artery microwire followed by a micro-sheath.  A J-wire was then advanced through the micro-sheath and a  5 [place] sheath was then inserted over a J-wire. J-wire was then advanced and a 5 French pigtail catheter was positioned at the level of T12. AP projection of the aorta was then obtained. Pigtail catheter was repositioned to above the bifurcation and a RAO view of the pelvis was obtained.  Subsequently a pigtail catheter with the stiff angle Glidewire was used to cross the aortic bifurcation the catheter wire were advanced down into the left distal external iliac artery. Oblique view of the femoral bifurcation was then obtained and subsequently the wire was reintroduced and the pigtail catheter negotiated into the SFA representing third order catheter placement. Distal runoff was then performed.  Diagnostic interpretation: The abdominal aorta is opacified with a bolus injection of contrast.  There minimal atherosclerotic changes throughout the aorta iliacs.  There are no hemodynamically significant stenoses.  The aorta and bilateral common external and internal iliacs are widely patent.  The left common femoral profunda femoris and superficial femoral artery are widely patent with mild atherosclerotic changes.  In the mid popliteal directly posterior to his total knee replacement there is a focal 99% stenosis extending over 30 mm to 40 mm in length.  Distally the popliteal is widely patent.  Trifurcation is widely patent with the anterior tibial being the dominant runoff to the foot.  Posterior tibial demonstrates some mild to moderate  atherosclerotic changes particularly proximally where there are tandem 30 to 40% lesions.  These do not appear to be hemodynamically significant at this time.  Peroneal is widely patent but small.  5000 units of heparin was then given and allowed to circulate and a 6 [place] Rabie sheath was advanced up and over the bifurcation and positioned in the  mid left superficial femoral artery  KMP  catheter and advantage Glidewire were then negotiated down into the distal popliteal.  Hand-injection contrast was used to verify intraluminal positioning.  The advantage wire was reintroduced and a 4 mm x 40 mm ultra score balloon is advanced across the lesion.  Inflation is to 10 atm for approximately 30 to 45 seconds.  Next a 6 mm x 60 mm life stent is deployed centering on this lesion.  A 5 mm x 60 mm Lutonix drug-eluting balloon was then inflated to 10 atm for 1 full minute.  Follow-up imaging now demonstrates wide patency of the entire popliteal with less than 10% residual stenosis.  The distal runoff is preserved.  After review of these images the sheath is pulled into the right external iliac oblique of the common femoral is obtained and a Star close device deployed. There no immediate complications.   Findings:   The abdominal aorta is opacified with a bolus injection of contrast.  There minimal atherosclerotic changes throughout the aorta iliacs.  There are no hemodynamically significant stenoses.  The aorta and bilateral common external and internal iliacs are widely patent.  The left common femoral profunda femoris and superficial femoral artery are widely patent with mild atherosclerotic changes.  In the mid left popliteal directly posterior to his total knee replacement there is a focal 99% stenosis extending over 30 mm to 40 mm in length.  Distally the popliteal is widely patent.  Trifurcation is widely patent with the anterior tibial being the dominant runoff to the foot.  Posterior tibial demonstrates some mild to moderate atherosclerotic changes particularly proximally where there are tandem 30 to 40% lesions.  These do not appear to be hemodynamically significant at this time.  Peroneal is widely patent but small.  Following angioplasty and stent placement the left popliteal is now patent with in-line flow and looks quite nice there is less  than 10% residual stenosis and preservation of his three-vessel runoff to the foot.     Summary: Successful recanalization left lower extremity for limb salvage                        Disposition: Patient was taken to the recovery room in stable condition having tolerated the procedure well.  Erik Martinez, Dolores Lory 11/17/2020,10:54 AM

## 2020-11-19 LAB — CBC AND DIFFERENTIAL
HCT: 43 (ref 41–53)
Hemoglobin: 13.8 (ref 13.5–17.5)
Neutrophils Absolute: 5.1
Platelets: 217 (ref 150–399)
WBC: 7.6

## 2020-11-19 LAB — HEMOGLOBIN A1C: Hemoglobin A1C: 6.7

## 2020-11-19 LAB — VITAMIN B12: Vitamin B-12: 383

## 2020-11-19 LAB — COMPREHENSIVE METABOLIC PANEL
Albumin: 4.8 (ref 3.5–5.0)
Calcium: 9.6 (ref 8.7–10.7)
GFR calc non Af Amer: 71
Globulin: 2.8

## 2020-11-19 LAB — CBC: RBC: 5.72 — AB (ref 3.87–5.11)

## 2020-11-19 LAB — BASIC METABOLIC PANEL
BUN: 19 (ref 4–21)
CO2: 25 — AB (ref 13–22)
Chloride: 98 — AB (ref 99–108)
Creatinine: 1.2 (ref 0.6–1.3)
Potassium: 4.8 (ref 3.4–5.3)
Sodium: 138 (ref 137–147)

## 2020-11-19 LAB — HEPATIC FUNCTION PANEL
ALT: 20 (ref 10–40)
AST: 26 (ref 14–40)
Alkaline Phosphatase: 55 (ref 25–125)
Bilirubin, Total: 0.3

## 2020-11-19 LAB — TESTOSTERONE: Testosterone: 191

## 2020-11-19 NOTE — Progress Notes (Signed)
I called and spoke with the patient and he stated that the he did see the neurologist and they recommended that it was okay to increase, but the patient stated he had surgery last week for a blockage in his leg and it helped him 100% and he is scheduled to do the opposite leg and he is okay right now with the medications he has but will let you know in the future if the increase is even needed.  Serafina Topham,cma

## 2020-11-23 ENCOUNTER — Telehealth: Payer: Self-pay | Admitting: Podiatry

## 2020-11-23 NOTE — Telephone Encounter (Signed)
Received [employer] auth # [id number] for diabetic shoes/inserts(A5500 [phone]) valid 11.10.2022 thru 2.8.2023.Marland KitchenMarland Kitchen

## 2020-11-24 ENCOUNTER — Telehealth: Payer: Self-pay | Admitting: Podiatry

## 2020-11-24 ENCOUNTER — Other Ambulatory Visit: Payer: Self-pay | Admitting: Podiatry

## 2020-11-24 DIAGNOSIS — N529 Male erectile dysfunction, unspecified: Secondary | ICD-10-CM | POA: Diagnosis not present

## 2020-11-24 DIAGNOSIS — K429 Umbilical hernia without obstruction or gangrene: Secondary | ICD-10-CM | POA: Diagnosis not present

## 2020-11-24 DIAGNOSIS — E6609 Other obesity due to excess calories: Secondary | ICD-10-CM | POA: Diagnosis not present

## 2020-11-24 DIAGNOSIS — E1165 Type 2 diabetes mellitus with hyperglycemia: Secondary | ICD-10-CM | POA: Diagnosis not present

## 2020-11-24 DIAGNOSIS — E7800 Pure hypercholesterolemia, unspecified: Secondary | ICD-10-CM | POA: Diagnosis not present

## 2020-11-24 DIAGNOSIS — E291 Testicular hypofunction: Secondary | ICD-10-CM | POA: Diagnosis not present

## 2020-11-24 MED ORDER — CICLOPIROX 8 % EX SOLN: Freq: Every day | CUTANEOUS | 0 refills | Status: DC

## 2020-11-24 NOTE — Telephone Encounter (Signed)
Pt called to check status of diabetic shoes. I explained that they are not in but looks like they should be shipping soon.   He then asked if the doctor could call something for a toenail fungus. He has tried over the Child psychotherapist and it is not working. I told him I would send a message to the doctor and see if he could or if pt needed an appt. He asked if it would be over week before anyone got back to him and I told him I was sending the message now.

## 2020-11-24 NOTE — Telephone Encounter (Signed)
[place] 8% solution sent to [facility] in [place] on [address].  Thanks, Dr. Amalia Hailey

## 2020-11-24 NOTE — Progress Notes (Signed)
As needed onychomycosis of toenails

## 2020-12-01 DIAGNOSIS — E291 Testicular hypofunction: Secondary | ICD-10-CM | POA: Diagnosis not present

## 2020-12-01 DIAGNOSIS — K429 Umbilical hernia without obstruction or gangrene: Secondary | ICD-10-CM | POA: Diagnosis not present

## 2020-12-01 DIAGNOSIS — N529 Male erectile dysfunction, unspecified: Secondary | ICD-10-CM | POA: Diagnosis not present

## 2020-12-01 DIAGNOSIS — E6609 Other obesity due to excess calories: Secondary | ICD-10-CM | POA: Diagnosis not present

## 2020-12-01 DIAGNOSIS — E7800 Pure hypercholesterolemia, unspecified: Secondary | ICD-10-CM | POA: Diagnosis not present

## 2020-12-01 DIAGNOSIS — E1165 Type 2 diabetes mellitus with hyperglycemia: Secondary | ICD-10-CM | POA: Diagnosis not present

## 2020-12-07 ENCOUNTER — Other Ambulatory Visit: Payer: Self-pay

## 2020-12-07 ENCOUNTER — Telehealth: Payer: Self-pay | Admitting: Adult Health

## 2020-12-07 DIAGNOSIS — E1142 Type 2 diabetes mellitus with diabetic polyneuropathy: Secondary | ICD-10-CM

## 2020-12-07 MED ORDER — OZEMPIC (0.25 OR 0.5 MG/DOSE) 2 MG/1.5ML ~~LOC~~ SOPN: 0.5000 mg | PEN_INJECTOR | SUBCUTANEOUS | 1 refills | Status: DC

## 2020-12-07 NOTE — Telephone Encounter (Signed)
Medication sent in to preferred pharmacy per protocol.   

## 2020-12-07 NOTE — Telephone Encounter (Signed)
Pt is calling in regards to medication refill for Ozempic. Pt uses [facility] on [address]

## 2020-12-09 NOTE — Telephone Encounter (Signed)
Pt calling in regards to medication ozempic. Pt states his prescription was filled and when he went to pick up it ended up costing $629. Pt says this is entirely too much for him. He has applied online for a program through [facility]. Pt wants to know if he could get samples until this is approved for him. Pt states his A1C is down and the medication is definitely working for him. Pt took his shot on Monday so states his is content but he is completely out of this medication.  Pt also states his insurance will be changing on the 7th of December to the [place]. Pt wants to know how he can get his medications through the [place] and how does that work.

## 2020-12-10 ENCOUNTER — Telehealth: Payer: Self-pay

## 2020-12-10 ENCOUNTER — Other Ambulatory Visit: Payer: Self-pay | Admitting: Adult Health

## 2020-12-10 DIAGNOSIS — F419 Anxiety disorder, unspecified: Secondary | ICD-10-CM

## 2020-12-10 DIAGNOSIS — E1142 Type 2 diabetes mellitus with diabetic polyneuropathy: Secondary | ICD-10-CM

## 2020-12-10 DIAGNOSIS — G629 Polyneuropathy, unspecified: Secondary | ICD-10-CM

## 2020-12-10 DIAGNOSIS — E1169 Type 2 diabetes mellitus with other specified complication: Secondary | ICD-10-CM

## 2020-12-10 DIAGNOSIS — E782 Mixed hyperlipidemia: Secondary | ICD-10-CM

## 2020-12-10 DIAGNOSIS — I739 Peripheral vascular disease, unspecified: Secondary | ICD-10-CM

## 2020-12-10 NOTE — Chronic Care Management (AMB) (Signed)
  Chronic Care Management   Note  12/10/2020 Name: DERRIUS FURTICK MRN: [id number] DOB: 02-07-1955  JAMELLE GOLDSTON is a 65 y.o. year old male who is a primary care patient of [name], [name], FNP. I reached out to [name] by phone today in response to a referral sent by Mr. ANTOINO WESTHOFF PCP.  Mr. Pickard was given information about Chronic Care Management services today including:  CCM service includes personalized support from designated clinical staff supervised by his physician, including individualized plan of care and coordination with other care providers 24/7 contact phone numbers for assistance for urgent and routine care needs. Service will only be billed when office clinical staff spend 20 minutes or more in a month to coordinate care. Only one practitioner may furnish and bill the service in a calendar month. The patient may stop CCM services at any time (effective at the end of the month) by phone call to the office staff. The patient is responsible for co-pay (up to 20% after annual deductible is met) if co-pay is required by the individual health plan.   Patient agreed to services and verbal consent obtained.   Follow up plan: Telephone appointment with care management team member scheduled for:01/06/2021  Noreene Larsson, [place], [place], [place] [address] Direct Dial: [phone] [email or web address] Website: [email or web address]

## 2020-12-10 NOTE — Chronic Care Management (AMB) (Signed)
  Chronic Care Management   Outreach Note  12/10/2020 Name: Erik Martinez MRN: [id number] DOB: [birth year]  Erik Martinez is a 65 y.o. year old male who is a primary care patient of [name], [name], FNP. I reached out to [name] by phone today in response to a referral sent by Erik Martinez primary care provider.  An unsuccessful telephone outreach was attempted today. The patient was referred to the case management team for assistance with care management and care coordination.   Follow Up Plan: A HIPAA compliant phone message was left for the patient providing contact information and requesting a return call.  The care management team will reach out to the patient again over the next 5 days.  If patient returns call to provider office, please advise to call [facility] at [address], [place], [place], [place] [address] Direct Dial: [phone] [email or web address]

## 2020-12-10 NOTE — Telephone Encounter (Signed)
Patient picked up sample 

## 2020-12-10 NOTE — Progress Notes (Signed)
Orders Placed This Encounter  Procedures   AMB Referral to [facility] Coordinaton    Referral Priority:   Routine    Referral Type:   Consultation    Referral Reason:   Care Coordination    Number of Visits Requested:   1

## 2020-12-10 NOTE — Telephone Encounter (Signed)
Spoke with pharmacy. Appears patient may be in the Coverage Gap now.   Called patient. He is at the [facility] now getting his insurance card and figuring out the logistics for getting his medications prescribed from there. He will talk to [facility] at his upcoming appointment. Discussed referral to me if coordination help needed  Will provide patient a sample to tide him over until [place] coverage logistics figured out  Medication Samples have been labeled and logged for the patient.  Drug name: Ozempic       Strength: 0.25/0.5 mg         Qty: 1   LOT: [id number]  Exp.Date: 05/10/2023   Dosing instructions: Inject 0.5 mg weekly  The patient has been instructed regarding the correct time, dose, and frequency of taking this medication, including desired effects and most common side effects.   De Hollingshead 10:16 AM 12/10/2020

## 2020-12-14 ENCOUNTER — Encounter (INDEPENDENT_AMBULATORY_CARE_PROVIDER_SITE_OTHER): Payer: Self-pay | Admitting: Vascular Surgery

## 2020-12-14 ENCOUNTER — Telehealth: Payer: Self-pay | Admitting: Podiatry

## 2020-12-14 ENCOUNTER — Other Ambulatory Visit: Payer: Self-pay

## 2020-12-14 ENCOUNTER — Ambulatory Visit (INDEPENDENT_AMBULATORY_CARE_PROVIDER_SITE_OTHER): Payer: PPO | Admitting: Vascular Surgery

## 2020-12-14 ENCOUNTER — Ambulatory Visit (INDEPENDENT_AMBULATORY_CARE_PROVIDER_SITE_OTHER): Payer: PPO

## 2020-12-14 ENCOUNTER — Other Ambulatory Visit (INDEPENDENT_AMBULATORY_CARE_PROVIDER_SITE_OTHER): Payer: Self-pay | Admitting: Vascular Surgery

## 2020-12-14 ENCOUNTER — Encounter (INDEPENDENT_AMBULATORY_CARE_PROVIDER_SITE_OTHER): Payer: Self-pay

## 2020-12-14 VITALS — BP 148/76 | HR 80 | Resp 16 | Wt 205.6 lb

## 2020-12-14 DIAGNOSIS — I70222 Atherosclerosis of native arteries of extremities with rest pain, left leg: Secondary | ICD-10-CM

## 2020-12-14 DIAGNOSIS — G629 Polyneuropathy, unspecified: Secondary | ICD-10-CM | POA: Diagnosis not present

## 2020-12-14 DIAGNOSIS — M5136 Other intervertebral disc degeneration, lumbar region: Secondary | ICD-10-CM

## 2020-12-14 DIAGNOSIS — E782 Mixed hyperlipidemia: Secondary | ICD-10-CM

## 2020-12-14 DIAGNOSIS — I70213 Atherosclerosis of native arteries of extremities with intermittent claudication, bilateral legs: Secondary | ICD-10-CM | POA: Diagnosis not present

## 2020-12-14 DIAGNOSIS — K219 Gastro-esophageal reflux disease without esophagitis: Secondary | ICD-10-CM | POA: Diagnosis not present

## 2020-12-14 DIAGNOSIS — Z95820 Peripheral vascular angioplasty status with implants and grafts: Secondary | ICD-10-CM

## 2020-12-14 NOTE — Progress Notes (Signed)
MRN : [id number]  Erik Martinez is a 65 y.o. (1955/02/28) male who presents with chief complaint of check leg.  History of Present Illness:   The patient returns to the office for followup and review status post angiogram with intervention on 11/17/2020:  Procedure Percutaneous transluminal angioplasty and stent placement left popliteal artery.   The patient notes improvement in the left lower extremity symptoms. No interval shortening of the patient's claudication distance or rest pain symptoms. Previous wounds have now healed.  No new ulcers or wounds have occurred since the last visit.  He is now voicing concerns because his right leg is causing him disabling claudication symptoms and mild rest pain.  He is asking for treatment of the right leg given the tremendous improvement of the left leg.  There have been no significant changes to the patient's overall health care.  The patient denies amaurosis fugax or recent TIA symptoms. There are no recent neurological changes noted. The patient denies history of DVT, PE or superficial thrombophlebitis. The patient denies recent episodes of angina or shortness of breath.     No outpatient medications have been marked as taking for the 12/14/20 encounter (Appointment) with Delana Meyer, Dolores Lory, MD.    Past Medical History:  Diagnosis Date   Anxiety    Arthritis    Avascular necrosis of bones of both hips ([place])    Bipolar disorder (HCC)    Bladder spasms    Chronic pain of left knee    Chronic pain syndrome    PAIN CLINIC   Cocaine use disorder, moderate, dependence ([place]) 09/09/2015   DDD (degenerative disc disease)    SPINE   Depression    Diabetes mellitus without complication (HCC)    Frequency-urgency syndrome    GERD (gastroesophageal reflux disease)    Hepatitis    Hepatitis    History of hepatitis B    1983  TX'D--  NO ISSUES OR SYMPTOMS SINCE   Hypotestosteronemia    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   Nocturia     PONV (postoperative nausea and vomiting)    "difficult to put me to sleep"   Spinal headache    Urine incontinence     Past Surgical History:  Procedure Laterality Date   BACK SURGERY     2 rods and 4 screws artificial disc   INTERSTIM IMPLANT PLACEMENT  2007   INTERSTIM IMPLANT REVISION N/A 08/17/2012   Procedure: REPLACMENT OF IPG PLUS REPLACE LEAD OF INTERSTIM IMPLANT ;  Surgeon: [name], MD;  Location: [place];  Service: Urology;  Laterality: N/A;   JOINT REPLACEMENT     Lt knee and Lt hip   LOWER EXTREMITY ANGIOGRAPHY Left 11/17/2020   Procedure: LOWER EXTREMITY ANGIOGRAPHY;  Surgeon: Katha Cabal, MD;  Location: [place] [facility];  Service: Cardiovascular;  Laterality: Left;   LUMBAR DISC SURGERY  2005   L5   LUMBAR FUSION  X2  2006  &  2007   OPEN DEBRIDEMENT LEFT TOTAL KNEE (SCAR, BONEY GRAOWTH)/ REMOVAL OLD SUTURES  05-01-2007   PARTIAL HIP ARTHROPLASTY Left 2001   x2   SHOULDER OPEN ROTATOR CUFF REPAIR Left 2000   TOTAL HIP ARTHROPLASTY Left 2002   TOTAL KNEE ARTHROPLASTY Left    PARTIAL LEFT KNEE REPLACEMENT PRIOR TO THIS   TOTAL KNEE REVISION Left 2008   TRIGGER FINGER RELEASE Bilateral    SEVERAL FINGERS   TRIGGER FINGER RELEASE Right 06/18/2014   Procedure: RELEASE TRIGGER  FINGER/A-1 PULLEY;  Surgeon: [name], MD;  Location: [facility];  Service: Orthopedics;  Laterality: Right;    Social History Social History   Tobacco Use   Smoking status: Never   Smokeless tobacco: Never  Vaping Use   Vaping Use: Never used  Substance Use Topics   Alcohol use: Not Currently    Alcohol/week: 0.0 - 2.0 standard drinks   Drug use: Not Currently    Types: Cocaine    Comment: pt states its been years since he used cocaine    Family History Family History  Problem Relation Age of Onset   Diabetes Mother    Arthritis Mother    Cancer Mother    Hyperlipidemia Mother    Stroke Mother    Arthritis Father    Cancer Father      Allergies  Allergen Reactions   Suboxone [Buprenorphine Hcl-Naloxone Hcl] Nausea And Vomiting   Tape Other (See Comments)    Whelps *Paper Tape is ok    Codeine Rash, Itching and Nausea Only   Morphine And Related Itching and Rash   Other Rash    Telemetry electrodes     REVIEW OF SYSTEMS (Negative unless checked)  Constitutional: [] Weight loss  [] Fever  [] Chills Cardiac: [] Chest pain   [] Chest pressure   [] Palpitations   [] Shortness of breath when laying flat   [] Shortness of breath with exertion. Vascular:  [x] Pain in legs with walking   [] Pain in legs at rest  [] History of DVT   [] Phlebitis   [] Swelling in legs   [] Varicose veins   [] Non-healing ulcers Pulmonary:   [] Uses home oxygen   [] Productive cough   [] Hemoptysis   [] Wheeze  [] COPD   [] Asthma Neurologic:  [] Dizziness   [] Seizures   [] History of stroke   [] History of TIA  [] Aphasia   [] Vissual changes   [] Weakness or numbness in arm   [] Weakness or numbness in leg Musculoskeletal:   [] Joint swelling   [x] Joint pain   [x] Low back pain Hematologic:  [] Easy bruising  [] Easy bleeding   [] Hypercoagulable state   [] Anemic Gastrointestinal:  [] Diarrhea   [] Vomiting  [x] Gastroesophageal reflux/heartburn   [] Difficulty swallowing. Genitourinary:  [] Chronic kidney disease   [] Difficult urination  [] Frequent urination   [] Blood in urine Skin:  [] Rashes   [] Ulcers  Psychological:  [] History of anxiety   []  History of major depression.  Physical Examination  There were no vitals filed for this visit. There is no height or weight on file to calculate BMI. Gen: WD/WN, NAD Head: [place]/AT, No temporalis wasting.  Ear/Nose/Throat: Hearing grossly intact, nares w/o erythema or drainage Eyes: PER, EOMI, sclera nonicteric.  Neck: Supple, no masses.  No bruit or JVD.  Pulmonary:  Good air movement, no audible wheezing, no use of accessory muscles.  Cardiac: RRR, normal S1, S2, no Murmurs. Vascular:   Vessel Right Left  Radial Palpable  Palpable  PT Not Palpable Not Palpable  DP Not Palpable Palpable  Gastrointestinal: soft, non-distended. No guarding/no peritoneal signs.  Musculoskeletal: M/S 5/5 throughout.  No visible deformity.  Neurologic: CN 2-12 intact. Pain and light touch intact in extremities.  Symmetrical.  Speech is fluent. Motor exam as listed above. Psychiatric: Judgment intact, Mood & affect appropriate for pt's clinical situation. Dermatologic: No rashes or ulcers noted.  No changes consistent with cellulitis.   CBC Lab Results  Component Value Date   WBC 5.0 06/19/2020   HGB 13.4 (A) 06/19/2020   HCT 40 (A) 06/19/2020   MCV 88.8  11/22/2017   PLT 183 06/19/2020    BMET    Component Value Date/Time   NA 139 06/19/2020 0000   NA 142 05/23/2013 0329   K 4.7 06/19/2020 0000   K 3.8 05/23/2013 0329   CL 95 (A) 06/19/2020 0000   CL 109 (H) 05/23/2013 0329   CO2 24 (A) 06/19/2020 0000   CO2 27 05/23/2013 0329   GLUCOSE 142 (H) 11/22/2017 0821   GLUCOSE 151 (H) 05/23/2013 0329   BUN 25 (H) 11/17/2020 0936   BUN 27 (A) 06/19/2020 0000   BUN 5 (L) 05/23/2013 0329   CREATININE 1.14 11/17/2020 0936   CREATININE 0.98 05/27/2013 0301   CALCIUM 9.5 06/19/2020 0000   CALCIUM 8.5 05/23/2013 0329   GFRNONAA >60 11/17/2020 0936   GFRNONAA >60 05/27/2013 0301   GFRAA >60 12/04/2016 0052   GFRAA >60 05/27/2013 0301   CrCl cannot be calculated (Patient's most recent lab result is older than the maximum 21 days allowed.).  COAG Lab Results  Component Value Date   INR 0.95 10/17/2016   INR 1.04 07/15/2016   INR 1.0 04/30/2007    Radiology PERIPHERAL VASCULAR CATHETERIZATION  Result Date: 11/17/2020 See surgical note for result.    Assessment/Plan 1. Atherosclerosis of native artery of both lower extremities with intermittent claudication (HCC) Recommend:  He is s/p successful intervention of his left leg and is now voicing concerns because his right leg is causing him disabling claudication  symptoms and mild rest pain.  He is asking for treatment of the right leg given the tremendous improvement of the left leg.  The patient is noting increased symptoms on the right and is now describing right leg lifestyle limiting claudication and mild rest pain.  Given the severity of the patient's right lower extremity symptoms the patient should undergo angiography and intervention.  Risk and benefits were reviewed the patient.  Indications for the procedure were reviewed.  All questions were answered, the patient agrees to proceed with right leg angiogram and intervention.   The patient should continue walking and begin a more formal exercise program.  The patient should continue antiplatelet therapy and aggressive treatment of the lipid abnormalities  The patient will follow up with me after the right leg angiogram angiogram.    2. Gastroesophageal reflux disease without esophagitis Continue PPI as already ordered, this medication has been reviewed and there are no changes at this time.  Avoidence of caffeine and alcohol  Moderate elevation of the head of the bed    3. Neuropathy Continue gabapentin and follow up with pain service and spine service  4. DDD (degenerative disc disease), lumbar See #3  5. Mixed hyperlipidemia Continue statin as ordered and reviewed, no changes at this time     [name], MD  12/14/2020 10:18 AM

## 2020-12-14 NOTE — Telephone Encounter (Signed)
Diabetic shoes/inserts in gso to be taken to [facility].. lvm for pt to call to schedule an appt to pick them up in [place] office. Next available 12.16

## 2020-12-14 NOTE — H&P (View-Only) (Signed)
MRN : [id number]  Erik Martinez is a 65 y.o. ([birth year]) male who presents with chief complaint of check leg.  History of Present Illness:   The patient returns to the office for followup and review status post angiogram with intervention on 11/17/2020:  Procedure Percutaneous transluminal angioplasty and stent placement left popliteal artery.   The patient notes improvement in the left lower extremity symptoms. No interval shortening of the patient's claudication distance or rest pain symptoms. Previous wounds have now healed.  No new ulcers or wounds have occurred since the last visit.  He is now voicing concerns because his right leg is causing him disabling claudication symptoms and mild rest pain.  He is asking for treatment of the right leg given the tremendous improvement of the left leg.  There have been no significant changes to the patient's overall health care.  The patient denies amaurosis fugax or recent TIA symptoms. There are no recent neurological changes noted. The patient denies history of DVT, PE or superficial thrombophlebitis. The patient denies recent episodes of angina or shortness of breath.     No outpatient medications have been marked as taking for the 12/14/20 encounter (Appointment) with Delana Meyer, Dolores Lory, MD.    Past Medical History:  Diagnosis Date   Anxiety    Arthritis    Avascular necrosis of bones of both hips ([place])    Bipolar disorder (HCC)    Bladder spasms    Chronic pain of left knee    Chronic pain syndrome    PAIN CLINIC   Cocaine use disorder, moderate, dependence ([place]) 09/09/2015   DDD (degenerative disc disease)    SPINE   Depression    Diabetes mellitus without complication (HCC)    Frequency-urgency syndrome    GERD (gastroesophageal reflux disease)    Hepatitis    Hepatitis    History of hepatitis B    1983  TX'D--  NO ISSUES OR SYMPTOMS SINCE   Hypotestosteronemia    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   Nocturia     PONV (postoperative nausea and vomiting)    "difficult to put me to sleep"   Spinal headache    Urine incontinence     Past Surgical History:  Procedure Laterality Date   BACK SURGERY     2 rods and 4 screws artificial disc   INTERSTIM IMPLANT PLACEMENT  2007   INTERSTIM IMPLANT REVISION N/A 08/17/2012   Procedure: REPLACMENT OF IPG PLUS REPLACE LEAD OF INTERSTIM IMPLANT ;  Surgeon: [name], MD;  Location: [place];  Service: Urology;  Laterality: N/A;   JOINT REPLACEMENT     Lt knee and Lt hip   LOWER EXTREMITY ANGIOGRAPHY Left 11/17/2020   Procedure: LOWER EXTREMITY ANGIOGRAPHY;  Surgeon: Katha Cabal, MD;  Location: [place] [facility];  Service: Cardiovascular;  Laterality: Left;   LUMBAR DISC SURGERY  2005   L5   LUMBAR FUSION  X2  2006  &  2007   OPEN DEBRIDEMENT LEFT TOTAL KNEE (SCAR, BONEY GRAOWTH)/ REMOVAL OLD SUTURES  05-01-2007   PARTIAL HIP ARTHROPLASTY Left 2001   x2   SHOULDER OPEN ROTATOR CUFF REPAIR Left 2000   TOTAL HIP ARTHROPLASTY Left 2002   TOTAL KNEE ARTHROPLASTY Left    PARTIAL LEFT KNEE REPLACEMENT PRIOR TO THIS   TOTAL KNEE REVISION Left 2008   TRIGGER FINGER RELEASE Bilateral    SEVERAL FINGERS   TRIGGER FINGER RELEASE Right 06/18/2014   Procedure: RELEASE TRIGGER  FINGER/A-1 PULLEY;  Surgeon: [name], MD;  Location: [facility];  Service: Orthopedics;  Laterality: Right;    Social History Social History   Tobacco Use   Smoking status: Never   Smokeless tobacco: Never  Vaping Use   Vaping Use: Never used  Substance Use Topics   Alcohol use: Not Currently    Alcohol/week: 0.0 - 2.0 standard drinks   Drug use: Not Currently    Types: Cocaine    Comment: pt states its been years since he used cocaine    Family History Family History  Problem Relation Age of Onset   Diabetes Mother    Arthritis Mother    Cancer Mother    Hyperlipidemia Mother    Stroke Mother    Arthritis Father    Cancer Father      Allergies  Allergen Reactions   Suboxone [Buprenorphine Hcl-Naloxone Hcl] Nausea And Vomiting   Tape Other (See Comments)    Whelps *Paper Tape is ok    Codeine Rash, Itching and Nausea Only   Morphine And Related Itching and Rash   Other Rash    Telemetry electrodes     REVIEW OF SYSTEMS (Negative unless checked)  Constitutional: [] Weight loss  [] Fever  [] Chills Cardiac: [] Chest pain   [] Chest pressure   [] Palpitations   [] Shortness of breath when laying flat   [] Shortness of breath with exertion. Vascular:  [x] Pain in legs with walking   [] Pain in legs at rest  [] History of DVT   [] Phlebitis   [] Swelling in legs   [] Varicose veins   [] Non-healing ulcers Pulmonary:   [] Uses home oxygen   [] Productive cough   [] Hemoptysis   [] Wheeze  [] COPD   [] Asthma Neurologic:  [] Dizziness   [] Seizures   [] History of stroke   [] History of TIA  [] Aphasia   [] Vissual changes   [] Weakness or numbness in arm   [] Weakness or numbness in leg Musculoskeletal:   [] Joint swelling   [x] Joint pain   [x] Low back pain Hematologic:  [] Easy bruising  [] Easy bleeding   [] Hypercoagulable state   [] Anemic Gastrointestinal:  [] Diarrhea   [] Vomiting  [x] Gastroesophageal reflux/heartburn   [] Difficulty swallowing. Genitourinary:  [] Chronic kidney disease   [] Difficult urination  [] Frequent urination   [] Blood in urine Skin:  [] Rashes   [] Ulcers  Psychological:  [] History of anxiety   []  History of major depression.  Physical Examination  There were no vitals filed for this visit. There is no height or weight on file to calculate BMI. Gen: WD/WN, NAD Head: [place]/AT, No temporalis wasting.  Ear/Nose/Throat: Hearing grossly intact, nares w/o erythema or drainage Eyes: PER, EOMI, sclera nonicteric.  Neck: Supple, no masses.  No bruit or JVD.  Pulmonary:  Good air movement, no audible wheezing, no use of accessory muscles.  Cardiac: RRR, normal S1, S2, no Murmurs. Vascular:   Vessel Right Left  Radial Palpable  Palpable  PT Not Palpable Not Palpable  DP Not Palpable Palpable  Gastrointestinal: soft, non-distended. No guarding/no peritoneal signs.  Musculoskeletal: M/S 5/5 throughout.  No visible deformity.  Neurologic: CN 2-12 intact. Pain and light touch intact in extremities.  Symmetrical.  Speech is fluent. Motor exam as listed above. Psychiatric: Judgment intact, Mood & affect appropriate for pt's clinical situation. Dermatologic: No rashes or ulcers noted.  No changes consistent with cellulitis.   CBC Lab Results  Component Value Date   WBC 5.0 06/19/2020   HGB 13.4 (A) 06/19/2020   HCT 40 (A) 06/19/2020   MCV 88.8  11/22/2017   PLT 183 06/19/2020    BMET    Component Value Date/Time   NA 139 06/19/2020 0000   NA 142 05/23/2013 0329   K 4.7 06/19/2020 0000   K 3.8 05/23/2013 0329   CL 95 (A) 06/19/2020 0000   CL 109 (H) 05/23/2013 0329   CO2 24 (A) 06/19/2020 0000   CO2 27 05/23/2013 0329   GLUCOSE 142 (H) 11/22/2017 0821   GLUCOSE 151 (H) 05/23/2013 0329   BUN 25 (H) 11/17/2020 0936   BUN 27 (A) 06/19/2020 0000   BUN 5 (L) 05/23/2013 0329   CREATININE 1.14 11/17/2020 0936   CREATININE 0.98 05/27/2013 0301   CALCIUM 9.5 06/19/2020 0000   CALCIUM 8.5 05/23/2013 0329   GFRNONAA >60 11/17/2020 0936   GFRNONAA >60 05/27/2013 0301   GFRAA >60 12/04/2016 0052   GFRAA >60 05/27/2013 0301   CrCl cannot be calculated (Patient's most recent lab result is older than the maximum 21 days allowed.).  COAG Lab Results  Component Value Date   INR 0.95 10/17/2016   INR 1.04 07/15/2016   INR 1.0 04/30/2007    Radiology PERIPHERAL VASCULAR CATHETERIZATION  Result Date: 11/17/2020 See surgical note for result.    Assessment/Plan 1. Atherosclerosis of native artery of both lower extremities with intermittent claudication (HCC) Recommend:  He is s/p successful intervention of his left leg and is now voicing concerns because his right leg is causing him disabling claudication  symptoms and mild rest pain.  He is asking for treatment of the right leg given the tremendous improvement of the left leg.  The patient is noting increased symptoms on the right and is now describing right leg lifestyle limiting claudication and mild rest pain.  Given the severity of the patient's right lower extremity symptoms the patient should undergo angiography and intervention.  Risk and benefits were reviewed the patient.  Indications for the procedure were reviewed.  All questions were answered, the patient agrees to proceed with right leg angiogram and intervention.   The patient should continue walking and begin a more formal exercise program.  The patient should continue antiplatelet therapy and aggressive treatment of the lipid abnormalities  The patient will follow up with me after the right leg angiogram angiogram.    2. Gastroesophageal reflux disease without esophagitis Continue PPI as already ordered, this medication has been reviewed and there are no changes at this time.  Avoidence of caffeine and alcohol  Moderate elevation of the head of the bed    3. Neuropathy Continue gabapentin and follow up with pain service and spine service  4. DDD (degenerative disc disease), lumbar See #3  5. Mixed hyperlipidemia Continue statin as ordered and reviewed, no changes at this time     [name], MD  12/14/2020 10:18 AM

## 2020-12-15 ENCOUNTER — Ambulatory Visit (INDEPENDENT_AMBULATORY_CARE_PROVIDER_SITE_OTHER): Payer: PPO | Admitting: Adult Health

## 2020-12-15 ENCOUNTER — Encounter: Payer: Self-pay | Admitting: Adult Health

## 2020-12-15 ENCOUNTER — Other Ambulatory Visit: Payer: Self-pay

## 2020-12-15 VITALS — BP 138/70 | HR 63 | Temp 96.1°F | Ht 70.0 in | Wt 208.2 lb

## 2020-12-15 DIAGNOSIS — Z8619 Personal history of other infectious and parasitic diseases: Secondary | ICD-10-CM | POA: Diagnosis not present

## 2020-12-15 DIAGNOSIS — E782 Mixed hyperlipidemia: Secondary | ICD-10-CM

## 2020-12-15 DIAGNOSIS — G629 Polyneuropathy, unspecified: Secondary | ICD-10-CM | POA: Diagnosis not present

## 2020-12-15 DIAGNOSIS — M25541 Pain in joints of right hand: Secondary | ICD-10-CM | POA: Diagnosis not present

## 2020-12-15 DIAGNOSIS — M67431 Ganglion, right wrist: Secondary | ICD-10-CM

## 2020-12-15 DIAGNOSIS — E1165 Type 2 diabetes mellitus with hyperglycemia: Secondary | ICD-10-CM | POA: Diagnosis not present

## 2020-12-15 DIAGNOSIS — I739 Peripheral vascular disease, unspecified: Secondary | ICD-10-CM

## 2020-12-15 DIAGNOSIS — F332 Major depressive disorder, recurrent severe without psychotic features: Secondary | ICD-10-CM

## 2020-12-15 DIAGNOSIS — E11621 Type 2 diabetes mellitus with foot ulcer: Secondary | ICD-10-CM | POA: Insufficient documentation

## 2020-12-15 DIAGNOSIS — N529 Male erectile dysfunction, unspecified: Secondary | ICD-10-CM | POA: Diagnosis not present

## 2020-12-15 DIAGNOSIS — E6609 Other obesity due to excess calories: Secondary | ICD-10-CM | POA: Diagnosis not present

## 2020-12-15 DIAGNOSIS — M25542 Pain in joints of left hand: Secondary | ICD-10-CM

## 2020-12-15 DIAGNOSIS — E291 Testicular hypofunction: Secondary | ICD-10-CM | POA: Diagnosis not present

## 2020-12-15 DIAGNOSIS — F4310 Post-traumatic stress disorder, unspecified: Secondary | ICD-10-CM | POA: Diagnosis not present

## 2020-12-15 DIAGNOSIS — E7800 Pure hypercholesterolemia, unspecified: Secondary | ICD-10-CM | POA: Diagnosis not present

## 2020-12-15 DIAGNOSIS — M62830 Muscle spasm of back: Secondary | ICD-10-CM | POA: Insufficient documentation

## 2020-12-15 DIAGNOSIS — K429 Umbilical hernia without obstruction or gangrene: Secondary | ICD-10-CM | POA: Diagnosis not present

## 2020-12-15 MED ORDER — BACLOFEN 10 MG PO TABS
10.0000 mg | ORAL_TABLET | Freq: Two times a day (BID) | ORAL | 0 refills | Status: DC | PRN
Start: 2020-12-15 — End: 2020-12-31

## 2020-12-15 NOTE — Progress Notes (Signed)
Established Patient Office Visit  Subjective:  Patient ID: Erik Martinez, male    DOB: Oct 23, 1955  Age: 65 y.o. MRN: [id number]  CC:  Chief Complaint  Patient presents with   Follow-up    HPI CURT OATIS presents for follow up care.  History of chronic pain in back L5-S1/ history of this for years - history of surgery with 2 rods and 4 screws and artificial disc lumbar fusion.  He also has a history of partial hip arthroplasty.  He has neuropathy.  Denies any worsening symptoms but feels that his neuropathy is not controlled. Spasms. Stretching helps. History of seeing pain clinic and wanted to get away from opioids.  Taking diclofenac as prescribed currently.  He is no longer taking Mobic.  He has decreased gabapentin to 600 mg once a day and would like to take Lyrica only, he reports that while he was in prison he was on 200 mg 3 times a day of Lyrica and did much better with that.  He was also on gabapentin at the time but he has willing to decrease his gabapentin and come off of it he does report that over 4 to 5 years ago he was on narcotics and has decided is not want to take any more narcotics for pain but feels that Lyrica will help him avoid any narcotic.  He is in the [place] for ozempic and jardiance. He also has a sample of Norvodisck good for 3 more weeks.  He has been working with [employer] for the patient assistance program.  He recently applied to the [employer] and has been approved and is waiting on his card.  He is unsure if his PCP is able to prescribe medications to the [facility] or if he has to have a PCP there at [facility], he is encouraged to call the number on his card to find this out and give our office a call.  Dr. Alverda Skeans checked A1C was 6.2 he also had other labs, would lik eto send me those in Mychart and defer any labs today.  Provider is unable to see these labs today, patient would like to defer any other labs today until he has send these via  MyChart of brought to the office.  Seeing vascular.  He saw Dr. 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  To report great improvement in the left lower extremity symptoms without any claudication or pain with walking.  He reports he has walked all over today and has had no pain.  He did voice concerns to Dr. Delana Meyer about his right leg causing him some mild claudication symptoms at rest.  Patient is scheduled for lower extremity angiogram with Dr. Delana Meyer on 12/22/2020.  Patient has no history noted or reported of DVT or past nonhealing wounds.  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   Nocturia    PONV (postoperative nausea and vomiting)    "difficult to put me to sleep"   Spinal headache    Urine incontinence     Past Surgical History:  Procedure Laterality Date   BACK SURGERY     2 rods and 4 screws artificial disc   INTERSTIM IMPLANT PLACEMENT  2007   INTERSTIM IMPLANT REVISION N/A 08/17/2012   Procedure: REPLACMENT OF IPG PLUS REPLACE LEAD OF INTERSTIM IMPLANT ;  Surgeon: [name], MD;  Location: [place];  Service: Urology;  Laterality: N/A;   JOINT REPLACEMENT     Lt knee and Lt hip   LOWER EXTREMITY ANGIOGRAPHY Left 11/17/2020   Procedure: LOWER EXTREMITY ANGIOGRAPHY;  Surgeon:  Katha Cabal, MD;  Location: [place] [facility];  Service: Cardiovascular;  Laterality: Left;   LUMBAR DISC SURGERY  2005   L5   LUMBAR FUSION  X2  2006  &  2007   OPEN DEBRIDEMENT LEFT TOTAL KNEE (SCAR, BONEY GRAOWTH)/ REMOVAL OLD SUTURES  05-01-2007   PARTIAL HIP ARTHROPLASTY Left 2001   x2   SHOULDER OPEN ROTATOR CUFF REPAIR Left 2000   TOTAL HIP ARTHROPLASTY Left 2002   TOTAL KNEE ARTHROPLASTY Left    PARTIAL LEFT KNEE REPLACEMENT PRIOR TO THIS   TOTAL KNEE REVISION Left 2008   TRIGGER FINGER RELEASE Bilateral    SEVERAL FINGERS   TRIGGER FINGER RELEASE Right 06/18/2014   Procedure: RELEASE TRIGGER FINGER/A-1 PULLEY;  Surgeon: [name], MD;  Location: [facility];  Service: Orthopedics;  Laterality: Right;    Family History  Problem Relation Age of Onset   Diabetes Mother    Arthritis Mother    Cancer Mother    Hyperlipidemia Mother    Stroke Mother    Arthritis Father    Cancer Father     Social History   Socioeconomic History   Marital status: Married    Spouse name: Not on file   Number of children: Not on file   Years of education: Not on file   Highest education level: Not on file  Occupational History   Not on file  Tobacco Use   Smoking status: Never   Smokeless tobacco: Never  Vaping Use   Vaping Use: Never used  Substance and Sexual Activity   Alcohol use: Not Currently    Alcohol/week: 0.0 - 2.0 standard drinks   Drug use: Not Currently    Types: Cocaine    Comment: pt states its been years since he used cocaine   Sexual activity: Not Currently  Other Topics Concern   Not on file  Social History Narrative   Not on file   Social Determinants of Health   Financial Resource Strain: Not on file  Food Insecurity: Not on file  Transportation Needs: Not on file  Physical Activity: Not on file  Stress: Not on file  Social Connections: Not on file  Intimate Partner Violence: Not on file    Outpatient Medications Prior to Visit   Medication Sig Dispense Refill   acetaminophen (TYLENOL) 500 MG tablet Take 1,500 mg by mouth in the morning and at bedtime.     aspirin EC 81 MG tablet Take 1 tablet (81 mg total) by mouth daily. Swallow whole. 150 tablet 2   atorvastatin (  LIPITOR) 20 MG tablet Take 20 mg by mouth at bedtime.     ciclopirox (PENLAC) 8 % solution Apply topically at bedtime. Apply over nail and surrounding skin. Apply daily over previous coat. After seven (7) days, may remove with alcohol and continue cycle. 6.6 mL 0   clopidogrel (PLAVIX) 75 MG tablet Take 1 tablet (75 mg total) by mouth daily. 30 tablet 11   diclofenac (VOLTAREN) 75 MG EC tablet Take 1 tablet (75 mg total) by mouth 2 (two) times daily as needed. (Patient taking differently: Take 75 mg by mouth in the morning and at bedtime.) 60 tablet 2   empagliflozin (JARDIANCE) 25 MG TABS tablet Take 1 tablet (25 mg total) by mouth daily. 90 tablet 1   gabapentin (NEURONTIN) 600 MG tablet Take 600 mg by mouth See admin instructions. Take 1 tablet (600 mg) by mouth 2-3 times daily     metFORMIN (GLUCOPHAGE) 1000 MG tablet Take 1,000 mg by mouth 2 (two) times daily.     Multiple Vitamin (MULTIVITAMIN WITH MINERALS) TABS tablet Take 1 tablet by mouth in the morning.     pantoprazole (PROTONIX) 40 MG tablet Take 1 tablet (40 mg total) by mouth daily. 90 tablet 3   pregabalin (LYRICA) 150 MG capsule Take 1 capsule (150 mg total) by mouth in the morning, at noon, and at bedtime. 90 capsule 3   Semaglutide,0.25 or 0.5MG /DOS, (OZEMPIC, 0.25 OR 0.5 MG/DOSE,) 2 MG/1.5ML SOPN Inject 0.5 mg into the skin once a week. 6 mL 1   tadalafil (CIALIS) 20 MG tablet Take 20 mg by mouth daily as needed for erectile dysfunction.     TESTOSTERONE CYPIONATE IM Inject 1 Dose into the muscle every 14 (fourteen) days.     zolpidem (AMBIEN) 10 MG tablet Take 1 tablet (10 mg total) by mouth at bedtime as needed for sleep. (Patient taking differently: Take 10 mg by mouth at bedtime.) 30  tablet 0   meloxicam (MOBIC) 15 MG tablet Take 1 tablet (15 mg total) by mouth daily. 30 tablet 0   No facility-administered medications prior to visit.    Allergies  Allergen Reactions   Suboxone [Buprenorphine Hcl-Naloxone Hcl] Nausea And Vomiting   Tape Other (See Comments)    [place] *Paper Tape is ok    Codeine Rash, Itching and Nausea Only   Morphine And Related Itching and Rash   Other Rash    Telemetry electrodes    ROS Review of Systems  Constitutional: Negative.   HENT: Negative.    Respiratory: Negative.    Cardiovascular: Negative.   Gastrointestinal: Negative.   Musculoskeletal:  Positive for back pain. Negative for arthralgias.  Neurological:  Negative for weakness and numbness.  Psychiatric/Behavioral: Negative.       Objective:    Physical Exam Constitutional:      General: He is not in acute distress.    Appearance: Normal appearance. He is well-developed. He is not ill-appearing, toxic-appearing or diaphoretic.  HENT:     Head: Normocephalic and atraumatic.     Right Ear: Hearing, tympanic membrane, ear canal and external ear normal.     Left Ear: Hearing, tympanic membrane, ear canal and external ear normal.     Nose: Nose normal.     Mouth/Throat:     Pharynx: Uvula midline. No oropharyngeal exudate.  Eyes:     General: Lids are normal. No scleral icterus.       Right eye: No discharge.        Left  eye: No discharge.     Conjunctiva/sclera: Conjunctivae normal.     Pupils: Pupils are equal, round, and reactive to light.  Neck:     Thyroid: No thyromegaly.     Vascular: Normal carotid pulses. No carotid bruit, hepatojugular reflux or JVD.     Trachea: Trachea and phonation normal. No tracheal tenderness or tracheal deviation.     Meningeal: Brudzinski's sign absent.  Cardiovascular:     Rate and Rhythm: Normal rate and regular rhythm.     Pulses: Normal pulses.     Heart sounds: Normal heart sounds, S1 normal and S2 normal. Heart sounds not  distant. No murmur heard.   No friction rub. No gallop.  Pulmonary:     Effort: Pulmonary effort is normal. No accessory muscle usage or respiratory distress.     Breath sounds: Normal breath sounds. No stridor. No wheezing, rhonchi or rales.  Chest:     Chest wall: No tenderness.  Abdominal:     General: Bowel sounds are normal. There is no distension.     Palpations: Abdomen is soft. There is no mass.     Tenderness: There is no abdominal tenderness. There is no guarding or rebound.     Hernia: No hernia is present.  Musculoskeletal:        General: No tenderness or deformity.     Cervical back: Normal, full passive range of motion without pain, normal range of motion and neck supple.     Thoracic back: Normal.     Lumbar back: Spasms present. Negative right straight leg raise test and negative left straight leg raise test.       Back:     Comments: Lumbosacral strain with twisting movements   Lymphadenopathy:     Head:     Right side of head: No submental, submandibular, tonsillar, preauricular, posterior auricular or occipital adenopathy.     Left side of head: No submental, submandibular, tonsillar, preauricular, posterior auricular or occipital adenopathy.     Cervical: No cervical adenopathy.  Skin:    General: Skin is warm and dry.     Coloration: Skin is not pale.     Findings: No erythema or rash.     Nails: There is no clubbing.  Neurological:     Mental Status: He is alert and oriented to person, place, and time.     GCS: GCS eye subscore is 4. GCS verbal subscore is 5. GCS motor subscore is 6.     Cranial Nerves: No cranial nerve deficit.     Sensory: No sensory deficit.     Motor: No abnormal muscle tone.     Coordination: Coordination normal.     Deep Tendon Reflexes: Reflexes are normal and symmetric. Reflexes normal.  Psychiatric:        Speech: Speech normal.        Behavior: Behavior normal.        Thought Content: Thought content normal.        Judgment:  Judgment normal.    BP 138/70   Pulse 63   Temp (!) 96.1 F (35.6 C) (Skin)   Ht 5\' 10"  (1.778 m)   Wt 208 lb 3.2 oz (94.4 kg)   SpO2 97%   BMI 29.87 kg/m  Wt Readings from Last 3 Encounters:  12/15/20 208 lb 3.2 oz (94.4 kg)  12/14/20 205 lb 9.6 oz (93.3 kg)  11/17/20 199 lb (90.3 kg)     Health Maintenance Due  Topic Date Due  Pneumococcal Vaccine 92-35 Years old (1 - PCV) Never done   FOOT EXAM  Never done   URINE MICROALBUMIN  Never done   TETANUS/TDAP  Never done   Zoster Vaccines- Shingrix (1 of 2) Never done   COLONOSCOPY (Pts 45-64yrs Insurance coverage will need to be confirmed)  Never done   COVID-19 Vaccine (4 - Booster for Moderna series) 03/05/2020   INFLUENZA VACCINE  Never done    There are no preventive care reminders to display for this patient.  Lab Results  Component Value Date   TSH 1.58 06/19/2020   Lab Results  Component Value Date   WBC 5.0 06/19/2020   HGB 13.4 (A) 06/19/2020   HCT 40 (A) 06/19/2020   MCV 88.8 11/22/2017   PLT 183 06/19/2020   Lab Results  Component Value Date   NA 139 06/19/2020   K 4.7 06/19/2020   CO2 24 (A) 06/19/2020   GLUCOSE 142 (H) 11/22/2017   BUN 25 (H) 11/17/2020   CREATININE 1.14 11/17/2020   BILITOT 0.5 11/22/2017   ALKPHOS 68 06/19/2020   AST 16 06/19/2020   ALT 19 06/19/2020   PROT 7.0 11/22/2017   ALBUMIN 4.3 06/19/2020   CALCIUM 9.5 06/19/2020   ANIONGAP 12 12/04/2016   GFR 71.58 11/22/2017   Lab Results  Component Value Date   CHOL 310 (A) 06/19/2020   Lab Results  Component Value Date   HDL 49 06/19/2020   Lab Results  Component Value Date   LDLCALC 196 06/19/2020   Lab Results  Component Value Date   TRIG 323 (A) 06/19/2020   Lab Results  Component Value Date   CHOLHDL 2 11/22/2017   Lab Results  Component Value Date   HGBA1C 8.4 06/19/2020      Assessment & Plan:   Problem List Items Addressed This Visit       Cardiovascular and Mediastinum   PAD (peripheral  artery disease) ([place])     Digestive   History of hepatitis C   Relevant Orders   Ambulatory referral to Gastroenterology     Endocrine   Type 2 diabetes mellitus with hyperglycemia, without long-term current use of insulin (HCC)     Nervous and Auditory   Neuropathy     Other   Severe episode of recurrent major depressive disorder, without psychotic features (HCC)   PTSD (post-traumatic stress disorder)   Arthralgia   Hyperlipidemia   Ganglion cyst of wrist, right   Relevant Orders   Ambulatory referral to General Surgery   Back muscle spasm - Primary  Okay to try muscle relaxer, discussed not taking with her Ambien at bedtime.  Return to the office if any symptoms or not improving. Patient reports she is doing well with his history of depression, he is doing well since he was released from prison, trying to do better for himself.   He reports his diabetes hemoglobin A1c was improved at Dr. De Hollingshead office, and that he did all of his labs, I am unable to see those and have asked patient to drop those by the office or send [place] through [place] so I can evaluate his kidney function, discussed with Pharm.D. in the office Catie and in agreement that discontinuing his gabapentin 600 mg that he is only taking once daily now and increasing his Lyrica to 200 mg 3 times daily would be acceptable as long as his kidney function is within normal limits and we will have to keep a close watch on this.  He is in agreement to send me these labs, will review and see if he needs any further additional labs.  Has not had a complete physical exam since establishing care and is encouraged to schedule CPE in the near future.  He does have a ganglion cyst present on the right dorsal wrist he request surgical referral for that and that was placed today as well.  Meds ordered this encounter  Medications   baclofen (LIORESAL) 10 MG tablet    Sig: Take 1 tablet (10 mg total) by mouth 2 (two) times daily as  needed for muscle spasms.    Dispense:  30 each    Refill:  0   Patient was encouraged to call the number on the letter that he received from the [employer] [place] in [place] showing that he was receiving a veterans card soon in the mail.  He is advised to ask them if he can have PCP here at our office sent to pharmacy at the [facility] if his plan covers that if not if he will have to have a Derm PCP at the [facility] hospital.  He will call the office and let us know this information.  He verbalizes understanding that if he is covered under the [place] that his medication should be covered by the [place] and that he would no longer be a candidate for patient assistance through office.    Red Flags discussed. The patient was given clear instructions to go to ER or return to medical center if any red flags develop, symptoms do not improve, worsen or new problems develop. They verbalized understanding.  Follow-up: Return in about 1 month (around 01/15/2021), or if symptoms worsen or fail to improve, for at any time for any worsening symptoms, Go to Emergency room/ urgent care if worse.    Marcille Buffy, FNP

## 2020-12-15 NOTE — Patient Instructions (Signed)
Muscle Cramps and Spasms Muscle cramps and spasms are when muscles tighten by themselves. They usually get better within minutes. Muscle cramps are painful. They are usually stronger and last longer than muscle spasms. Muscle spasms may or may not be painful. They can last a few seconds or much longer. Cramps and spasms can affect any muscle, but they occur most often in the calf muscles of the leg. They are usually not caused by a serious problem. In many cases, the cause is not known. Some common causes include: Doing more physical work or exercise than your body is ready for. Using the muscles too much (overuse) by repeating certain movements too many times. Staying in a certain position for a long time. Playing a sport or doing an activity without preparing properly. Using bad form or technique while playing a sport or doing an activity. Not having enough water in your body (dehydration). Injury. Side effects of some medicines. Low levels of the salts and minerals in your blood (electrolytes), such as low potassium or calcium. Follow these instructions at home: Managing pain and stiffness   Massage, stretch, and relax the muscle. Do this for many minutes at a time. If told, put heat on tight or tense muscles as often as told by your doctor. Use the heat source that your doctor recommends, such as a moist heat pack or a heating pad. Place a towel between your skin and the heat source. Leave the heat on for 20-30 minutes. Remove the heat if your skin turns bright red. This is very important if you are not able to feel pain, heat, or cold. You may have a greater risk of getting burned. If told, put ice on the affected area. This may help if you are sore or have pain after a cramp or spasm. Put ice in a plastic bag. Place a towel between your skin and the bag. Leave the ice on for 20 minutes, 2-3 times a day. Try taking hot showers or baths to help relax tight muscles. Eating and  drinking Drink enough fluid to keep your pee (urine) pale yellow. Eat a healthy diet to help ensure that your muscles work well. This should include: Fruits and vegetables. Lean protein. Whole grains. Low-fat or nonfat dairy products. General instructions If you are having cramps often, avoid intense exercise for several days. Take over-the-counter and prescription medicines only as told by your doctor. Watch for any changes in your symptoms. Keep all follow-up visits as told by your doctor. This is important. Contact a doctor if: Your cramps or spasms get worse or happen more often. Your cramps or spasms do not get better with time. Summary Muscle cramps and spasms are when muscles tighten by themselves. They usually get better within minutes. Cramps and spasms occur most often in the calf muscles of the leg. Massage, stretch, and relax the muscle. This may help the cramp or spasm go away. Drink enough fluid to keep your pee (urine) pale yellow. This information is not intended to replace advice given to you by your health care provider. Make sure you discuss any questions you have with your health care provider. Document Revised: 07/17/2020 Document Reviewed: 07/17/2020 Elsevier Patient Education  [place].

## 2020-12-15 NOTE — Progress Notes (Signed)
Pre visit review using our clinic review tool, if applicable. No additional management support is needed unless otherwise documented below in the visit note. 

## 2020-12-16 ENCOUNTER — Telehealth (INDEPENDENT_AMBULATORY_CARE_PROVIDER_SITE_OTHER): Payer: Self-pay

## 2020-12-16 NOTE — Telephone Encounter (Signed)
Spoke with the patient and he is scheduled with Dr. Delana Meyer for a right leg angio on 12/22/20 with a 6:45 am arrival time to the [facility]. Pre-procedure instructions were discussed and will be mailed.,

## 2020-12-17 ENCOUNTER — Other Ambulatory Visit: Payer: Self-pay | Admitting: Family Medicine

## 2020-12-17 DIAGNOSIS — G629 Polyneuropathy, unspecified: Secondary | ICD-10-CM

## 2020-12-18 NOTE — Telephone Encounter (Signed)
Refilled: 08/11/2020 Last OV: 12/15/2020 Next OV: not scheduled

## 2020-12-20 NOTE — Telephone Encounter (Signed)
Please call him and see if he is sending me a copy of labs, we discussed increasing Lyrica  to 200 mg TID and also discontinuing neurontin. However he was going to send labs or drop by a copy so I can see his kidney function.

## 2020-12-22 ENCOUNTER — Encounter: Payer: Self-pay | Admitting: Vascular Surgery

## 2020-12-22 ENCOUNTER — Encounter
Admission: RE | Disposition: A | Discharge status: Home / Self Care | Payer: Self-pay | Source: Home / Self Care | Attending: Vascular Surgery

## 2020-12-22 ENCOUNTER — Other Ambulatory Visit: Payer: Self-pay

## 2020-12-22 ENCOUNTER — Other Ambulatory Visit (INDEPENDENT_AMBULATORY_CARE_PROVIDER_SITE_OTHER): Payer: Self-pay | Admitting: Nurse Practitioner

## 2020-12-22 ENCOUNTER — Ambulatory Visit
Admission: RE | Admit: 2020-12-22 | Discharge: 2020-12-22 | Disposition: A | Discharge status: Home / Self Care | Payer: PPO | Attending: Vascular Surgery | Admitting: Vascular Surgery

## 2020-12-22 DIAGNOSIS — K7460 Unspecified cirrhosis of liver: Principal | ICD-10-CM

## 2020-12-22 DIAGNOSIS — B182 Chronic viral hepatitis C: Principal | ICD-10-CM

## 2020-12-22 DIAGNOSIS — E782 Mixed hyperlipidemia: Secondary | ICD-10-CM | POA: Diagnosis not present

## 2020-12-22 DIAGNOSIS — M5136 Other intervertebral disc degeneration, lumbar region: Secondary | ICD-10-CM | POA: Diagnosis not present

## 2020-12-22 DIAGNOSIS — E1151 Type 2 diabetes mellitus with diabetic peripheral angiopathy without gangrene: Secondary | ICD-10-CM | POA: Diagnosis not present

## 2020-12-22 DIAGNOSIS — K219 Gastro-esophageal reflux disease without esophagitis: Secondary | ICD-10-CM | POA: Insufficient documentation

## 2020-12-22 DIAGNOSIS — E1140 Type 2 diabetes mellitus with diabetic neuropathy, unspecified: Secondary | ICD-10-CM | POA: Insufficient documentation

## 2020-12-22 DIAGNOSIS — I70213 Atherosclerosis of native arteries of extremities with intermittent claudication, bilateral legs: Secondary | ICD-10-CM

## 2020-12-22 DIAGNOSIS — I70222 Atherosclerosis of native arteries of extremities with rest pain, left leg: Secondary | ICD-10-CM

## 2020-12-22 DIAGNOSIS — I70219 Atherosclerosis of native arteries of extremities with intermittent claudication, unspecified extremity: Secondary | ICD-10-CM

## 2020-12-22 DIAGNOSIS — I70211 Atherosclerosis of native arteries of extremities with intermittent claudication, right leg: Secondary | ICD-10-CM | POA: Diagnosis not present

## 2020-12-22 HISTORY — PX: LOWER EXTREMITY ANGIOGRAPHY: CATH118251

## 2020-12-22 LAB — CREATININE, SERUM
Creatinine, Ser: 1.17 mg/dL (ref 0.61–1.24)
GFR, Estimated: >60 mL/min (ref >60)

## 2020-12-22 LAB — BUN: BUN: 27 mg/dL — ABNORMAL HIGH (ref 8–23)

## 2020-12-22 LAB — GLUCOSE, CAPILLARY: Glucose-Capillary: 137 mg/dL — ABNORMAL HIGH (ref 70–99)

## 2020-12-22 SURGERY — LOWER EXTREMITY ANGIOGRAPHY
Anesthesia: Moderate Sedation | Site: Leg Lower | Laterality: Right

## 2020-12-22 MED ORDER — LABETALOL HCL 5 MG/ML IV SOLN: 10.0000 mg | INTRAVENOUS | Status: DC | PRN

## 2020-12-22 MED ORDER — FENTANYL CITRATE (PF) 100 MCG/2ML IJ SOLN
INTRAMUSCULAR | Status: AC
  Filled 2020-12-22: qty 2

## 2020-12-22 MED ORDER — FENTANYL CITRATE (PF) 100 MCG/2ML IJ SOLN: 12.5000 ug | Freq: Once | INTRAMUSCULAR | Status: DC | PRN

## 2020-12-22 MED ORDER — MIDAZOLAM HCL 2 MG/2ML IJ SOLN
INTRAMUSCULAR | Status: DC | PRN
  Administered 2020-12-22: 2 mg via INTRAVENOUS
  Administered 2020-12-22: 1 mg via INTRAVENOUS

## 2020-12-22 MED ORDER — CEFAZOLIN SODIUM-DEXTROSE 2-4 GM/100ML-% IV SOLN
2.0000 g | Freq: Once | INTRAVENOUS | Status: AC
  Administered 2020-12-22: 2 g via INTRAVENOUS

## 2020-12-22 MED ORDER — SODIUM CHLORIDE 0.9 % IV SOLN: INTRAVENOUS | Status: DC

## 2020-12-22 MED ORDER — IODIXANOL 320 MG/ML IV SOLN
INTRAVENOUS | Status: DC | PRN
  Administered 2020-12-22: 30 mL via INTRA_ARTERIAL

## 2020-12-22 MED ORDER — SODIUM CHLORIDE 0.9% FLUSH: 3.0000 mL | Freq: Two times a day (BID) | INTRAVENOUS | Status: DC

## 2020-12-22 MED ORDER — ONDANSETRON HCL 4 MG/2ML IJ SOLN: 4.0000 mg | Freq: Four times a day (QID) | INTRAMUSCULAR | Status: DC | PRN

## 2020-12-22 MED ORDER — MORPHINE SULFATE (PF) 4 MG/ML IV SOLN: 2.0000 mg | INTRAVENOUS | Status: DC | PRN

## 2020-12-22 MED ORDER — METHYLPREDNISOLONE SODIUM SUCC 125 MG IJ SOLR: 125.0000 mg | Freq: Once | INTRAMUSCULAR | Status: DC | PRN

## 2020-12-22 MED ORDER — SODIUM CHLORIDE 0.9 % IV SOLN: 250.0000 mL | INTRAVENOUS | Status: DC | PRN

## 2020-12-22 MED ORDER — HYDRALAZINE HCL 20 MG/ML IJ SOLN: 5.0000 mg | INTRAMUSCULAR | Status: DC | PRN

## 2020-12-22 MED ORDER — MIDAZOLAM HCL 2 MG/ML PO SYRP: 8.0000 mg | ORAL_SOLUTION | Freq: Once | ORAL | Status: DC | PRN

## 2020-12-22 MED ORDER — OXYCODONE HCL 5 MG PO TABS: 5.0000 mg | ORAL_TABLET | ORAL | Status: DC | PRN

## 2020-12-22 MED ORDER — HEPARIN SODIUM (PORCINE) 1000 UNIT/ML IJ SOLN
INTRAMUSCULAR | Status: DC | PRN
  Administered 2020-12-22: 5000 [IU] via INTRAVENOUS

## 2020-12-22 MED ORDER — HEPARIN SODIUM (PORCINE) 1000 UNIT/ML IJ SOLN
INTRAMUSCULAR | Status: AC
  Filled 2020-12-22: qty 10

## 2020-12-22 MED ORDER — FENTANYL CITRATE PF 50 MCG/ML IJ SOSY
PREFILLED_SYRINGE | INTRAMUSCULAR | Status: AC
  Filled 2020-12-22: qty 1

## 2020-12-22 MED ORDER — MIDAZOLAM HCL 2 MG/2ML IJ SOLN
INTRAMUSCULAR | Status: AC
  Filled 2020-12-22: qty 2

## 2020-12-22 MED ORDER — FENTANYL CITRATE (PF) 100 MCG/2ML IJ SOLN
INTRAMUSCULAR | Status: DC | PRN
  Administered 2020-12-22 (×2): 50 ug via INTRAVENOUS

## 2020-12-22 MED ORDER — ACETAMINOPHEN 325 MG PO TABS: 650.0000 mg | ORAL_TABLET | ORAL | Status: DC | PRN

## 2020-12-22 MED ORDER — FAMOTIDINE 20 MG PO TABS: 40.0000 mg | ORAL_TABLET | Freq: Once | ORAL | Status: DC | PRN

## 2020-12-22 MED ORDER — SODIUM CHLORIDE 0.9% FLUSH: 3.0000 mL | INTRAVENOUS | Status: DC | PRN

## 2020-12-22 MED ORDER — DIPHENHYDRAMINE HCL 50 MG/ML IJ SOLN: 50.0000 mg | Freq: Once | INTRAMUSCULAR | Status: DC | PRN

## 2020-12-22 SURGICAL SUPPLY — 11 items
CANNULA 5F STIFF (CANNULA) ×1 IMPLANT
CATH TEMPO 5F RIM 65CM (CATHETERS) ×1 IMPLANT
COVER PROBE U/S 5X48 (MISCELLANEOUS) ×1 IMPLANT
DEVICE STARCLOSE SE CLOSURE (Vascular Products) ×1 IMPLANT
GLIDEWIRE ADV .035X260CM (WIRE) ×1 IMPLANT
PACK ANGIOGRAPHY (CUSTOM PROCEDURE TRAY) ×2 IMPLANT
SHEATH PINNACLE 5F 10CM (SHEATH) ×1 IMPLANT
SHEATH RAABE 6FR (SHEATH) ×1 IMPLANT
SYR MEDRAD MARK 7 150ML (SYRINGE) ×1 IMPLANT
TUBING CONTRAST HIGH PRESS 72 (TUBING) ×1 IMPLANT
WIRE GUIDERIGHT .035X150 (WIRE) ×1 IMPLANT

## 2020-12-22 NOTE — Interval H&P Note (Signed)
History and Physical Interval Note:  12/22/2020 7:55 AM  Erik Martinez  has presented today for surgery, with the diagnosis of RLE Angio   BARD   ASO w claudication.  The various methods of treatment have been discussed with the patient and family. After consideration of risks, benefits and other options for treatment, the patient has consented to  Procedure(s): LOWER EXTREMITY ANGIOGRAPHY (Right) as a surgical intervention.  The patient's history has been reviewed, patient examined, no change in status, stable for surgery.  I have reviewed the patient's chart and labs.  Questions were answered to the patient's satisfaction.     [name]

## 2020-12-22 NOTE — Op Note (Signed)
[facility]  Percutaneous Study/Intervention Procedural Note   Date of Surgery: 12/22/2020,8:45 AM  Surgeon:Shamya Macfadden, Dolores Lory   Pre-operative Diagnosis: Atherosclerotic occlusive disease bilateral lower extremities with lifestyle limiting claudication of the right lower extremity.  Post-operative diagnosis:  Same  Procedure(s) Performed:  1.  Right lower extremity angiography third order catheter placement  2.  Ultrasound-guided access to left common femoral  3.  StarClose left common femoral    Anesthesia: Conscious sedation was administered by the interventional radiology RN under my direct supervision. IV Versed plus fentanyl were utilized. Continuous ECG, pulse oximetry and blood pressure was monitored throughout the entire procedure.  Conscious sedation was administered for a total of 21 minutes.  Sheath: 6 [place] Rabie left common femoral retrograde  Contrast: 30 cc   Fluoroscopy Time: 2.6 minutes  Indications:  The patient presents to [facility] with pain in the right lower extremity.  He is status post intervention with angioplasty and stent placement of the left lower extremity on November 17, 2020 for rest pain symptoms.  He returned to the office in his left lower extremity feels so good he is anxious to treat his right leg.  Pedal pulses are nonpalpable on the right suggesting atherosclerotic occlusive disease.  The risks and benefits as well as alternative therapies for lower extremity revascularization are reviewed with the patient all questions are answered the patient agrees to proceed.  The patient is therefore undergoing angiography with the hope for intervention for limb salvage.   Procedure:  Erik Martinez a 65 y.o. male who was identified and appropriate procedural time out was performed.  The patient was then placed supine on the table and prepped and draped in the usual sterile fashion.  Ultrasound was used to evaluate the left  common femoral artery.  It was echolucent and pulsatile indicating it is patent .  An ultrasound image was acquired for the permanent record.  A micropuncture needle was used to access the left common femoral artery under direct ultrasound guidance.  The microwire was then advanced under fluoroscopic guidance without difficulty followed by the micro-sheath.  A 0.035 J wire was advanced without resistance and a 5Fr sheath was placed.    Rim catheter was then advanced to the level above the bifurcation and LAO view of the pelvis was obtained.  Advantage Glidewire and rim catheter was then used across the bifurcation and the catheter was positioned in the distal external iliac artery.  RAO of the right groin was then obtained. Wire was reintroduced and negotiated into the SFA and the catheter was advanced into the SFA. Distal runoff was then performed.  After review of the images the catheter was removed over wire and an LAO view of the groin was obtained. StarClose device was deployed without difficulty.   Findings:   Aortogram: Previously imaged and was widely patent  Right Lower Extremity: The right common iliac external and internal iliac arteries are all widely patent.  The right common femoral profunda femoris superficial femoral and popliteal arteries demonstrate mild atherosclerotic changes but are widely patent.  Trifurcation is widely patent and there is three-vessel runoff to the foot.  There is mild to moderate calcific disease in the tibials but there is no hemodynamically significant lesion.  There is mild to moderate distal small vessel disease intrinsic to the foot.    Disposition: Patient was taken to the recovery room in stable condition having tolerated the procedure well.  Erik Martinez 12/22/2020,8:45 AM

## 2020-12-22 NOTE — Telephone Encounter (Signed)
Pt called in regards to medication refill. Pt states he only has two pills left.

## 2020-12-25 ENCOUNTER — Ambulatory Visit: Payer: PPO | Admitting: Podiatry

## 2020-12-25 ENCOUNTER — Ambulatory Visit (INDEPENDENT_AMBULATORY_CARE_PROVIDER_SITE_OTHER): Payer: PPO

## 2020-12-25 ENCOUNTER — Encounter: Payer: Self-pay | Admitting: Podiatry

## 2020-12-25 ENCOUNTER — Ambulatory Visit: Payer: PPO

## 2020-12-25 ENCOUNTER — Other Ambulatory Visit: Payer: Self-pay

## 2020-12-25 ENCOUNTER — Other Ambulatory Visit: Payer: PPO

## 2020-12-25 DIAGNOSIS — M2011 Hallux valgus (acquired), right foot: Secondary | ICD-10-CM | POA: Diagnosis not present

## 2020-12-25 DIAGNOSIS — E0843 Diabetes mellitus due to underlying condition with diabetic autonomic (poly)neuropathy: Secondary | ICD-10-CM

## 2020-12-25 DIAGNOSIS — E1140 Type 2 diabetes mellitus with diabetic neuropathy, unspecified: Secondary | ICD-10-CM

## 2020-12-25 DIAGNOSIS — E1342 Other specified diabetes mellitus with diabetic polyneuropathy: Secondary | ICD-10-CM

## 2020-12-25 DIAGNOSIS — L84 Corns and callosities: Secondary | ICD-10-CM | POA: Diagnosis not present

## 2020-12-25 NOTE — Telephone Encounter (Signed)
Patient will be out of his pregabalin (LYRICA) 150 MG capsule this week. Patient is upset on why this medication has not been filled.

## 2020-12-25 NOTE — Progress Notes (Signed)
SITUATION Reason for Visit: Fitting of Diabetic Shoes & Insoles Patient / Caregiver Report:  Patient reports comfort and is satisfied  OBJECTIVE DATA: Patient History / Diagnosis:     ICD-10-CM   1. Diabetes mellitus due to underlying condition with diabetic autonomic neuropathy, unspecified whether long term insulin use (HCC)  E08.43       Change in Status:   None  ACTIONS PERFORMED: In-Person Delivery, patient was fit with: - 1x pair [id number] PDAC approved prefabricated Diabetic Shoes: Brooks black Interior and spatial designer - 3x pair [id number] PDAC approved CAM milled custom diabetic insoles  Shoes and insoles were verified for structural integrity and safety. Patient wore shoes and insoles in office. Skin was inspected and free of areas of concern after wearing shoes and inserts. Shoes and inserts fit properly. Patient / Caregiver provided with ferbal instruction and demonstration regarding donning, doffing, wear, care, proper fit, function, purpose, cleaning, and use of shoes and insoles ' and in all related precautions and risks and benefits regarding shoes and insoles. Patient / Caregiver was instructed to wear properly fitting socks with shoes at all times. Patient was also provided with verbal instruction regarding how to report any failures or malfunctions of shoes or inserts, and necessary follow up care. Patient / Caregiver was also instructed to contact physician regarding change in status that may affect function of shoes and inserts.   Patient / Caregiver verbalized undersatnding of instruction provided. Patient / Caregiver demonstrated independence with proper donning and doffing of shoes and inserts.  PLAN Patient to follow up as needed. Plan of care was discussed with and agreed upon by patient and/or caregiver. All questions were answered and concerns addressed.

## 2020-12-25 NOTE — Progress Notes (Signed)
HPI: 65 y.o. male presenting today for follow-up evaluation of bilateral lower extremity pain.  Patient states that he does have a history of lumbar radiculopathy with peripheral neuropathy which is complicated by diabetes.  Over the past year he is managed his diabetes well and closely monitors his blood glucose levels.    New complaints today: Patient is concerned due to some discoloration to the distal tips of the toes to the right foot.  He also has concerns for his thick discolored toenails to the right foot.  He says the left foot is fine.  He has tried topical antifungal with no improvement.  Finally he is concerned due to his bunion deformity to the right foot.  He has not done anything for treatment.  He presents for further treatment and evaluation  Past Medical History:  Diagnosis Date   Anxiety    Arthritis    Avascular necrosis of bones of both hips (HCC)    Bipolar disorder (HCC)    Bladder spasms    Chronic pain of left knee    Chronic pain syndrome    PAIN CLINIC   Cocaine use disorder, moderate, dependence ([place]) 09/09/2015   DDD (degenerative disc disease)    SPINE   Depression    Diabetes mellitus without complication (HCC)    Frequency-urgency syndrome    GERD (gastroesophageal reflux disease)    Hepatitis    Hepatitis    History of hepatitis B    1983  TX'D--  NO ISSUES OR SYMPTOMS SINCE   Hypotestosteronemia    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  Dermatology: Skin is warm, dry and supple bilateral lower extremities. Negative for open lesions or macerations.  Hyperkeratotic dystrophic nails noted 1-5 right foot consistent with onychomycosis of the toenails.  There is some hyperkeratotic preulcerative callus tissue to the distal tips of the toes  1-3 right foot.  Preulcerative calluses.  There is no open wounds upon debridement.  Vascular: Capillary refill within normal limits.  Palpable pulses both DP and PT.  Patient states that he has known history of PAD and is being managed by [facility]  Neurological: Epicritic and protective threshold diminished bilaterally.   Musculoskeletal Exam: Patient is concerned for mild hallux valgus deformity to the right lower extremity.  There is some lateral deviation of the right hallux which abuts the second toe right foot  Radiographic exam RT foot: Slightly increased intermetatarsal angle between the first and second intermetatarsal's with a hallux abductus angle and deviation of the hallux.  Diffuse degenerative changes noted throughout the pedal joints of the foot   Assessment: 1.  Chronic lumbar radiculopathy bilateral lower extremities 2.  Diabetes mellitus with peripheral polyneuropathy 3.  Peripheral arterial disease bilateral 4.  Hallux valgus right 5.  Onychomycosis of toenails right   Plan of Care:  1. Patient evaluated.  X-rays were reviewed and discussed the bunion deformity to the right foot.  Silicone toe spacers provided.  Recommend conservative treatment 2.  Appointment with Pedorthist to be molded for diabetic shoes and insoles 3.  Patient being managed by neurology and [facility].  Lower extremity angiography 12/22/2020. 4.   Severe neuropathy mostly unchanged to the bilateral lower  extremities complicated by diabetes and lumbar radiculopathy.  Continue Lyrica as per PCP.  Currently he takes 150 mg 3 times daily 5.  Diabetic shoes with insoles dispensed today. 6.  Patient would like to address his thick discolored toenails.  We discussed different treatment options including topical, oral, and laser antifungal treatment modalities.  The patient has tried topical antifungal with no improvement.  He would like to pursue laser treatment.  Advised  against any oral antifungal medication due to the patient's past medical history and hepatic monitoring 7.  Return to clinic in [place] for laser treatment      [name], DPM [facility]  Dr. [name], DPM    [address], [place] [address]                Office [phone]  Fax [phone]

## 2020-12-28 ENCOUNTER — Telehealth: Payer: Self-pay

## 2020-12-28 NOTE — Telephone Encounter (Signed)
Patient called office back, he said that his pharmacy said there is no way pregabalin (LYRICA) 150 MG capsule can have a 1 year refill. Please call him back, he said he is out of his medication.

## 2020-12-28 NOTE — Telephone Encounter (Signed)
Sent message to pt through [place]

## 2020-12-28 NOTE — Telephone Encounter (Signed)
Pt also ask about his ozempic. Pt states he only has one week left and hasnt heard anything back. I told pt I'd ask the PharmD. Pt requesting another sample if not able to get medication by next week.

## 2020-12-28 NOTE — Telephone Encounter (Signed)
Pt called office asking about his medication. Pt states baclofen provider prescribed isn't really helping much and wants to know if there is anything else he can try?

## 2020-12-28 NOTE — Telephone Encounter (Signed)
He was going to send me a copy of his labs from Dr. Carmela Rima.  Can he send these via [facility] ?  I was going to discontinue his Neurontin and increase Lyrica as we discussed to maximum dosage as discussed in office, if he will please send those labs to me I will send. He will stop Neurontin and start Lyrica and keep follow up in 1- 2 months.

## 2020-12-29 NOTE — Telephone Encounter (Signed)
Noted. Thanks.

## 2020-12-29 NOTE — Telephone Encounter (Signed)
I have not seen labs from patient yet, not in my folder and Kristal CMA checked her box as well. Patient can send labs he had done at Dr. Jacolyn Reedy  office in [place] or if they can be located up front. I need to see kidney function before increasing Lyrica to maximum dosage and discontinuing Neurontin.  Please advise patient yelling at staff is not acceptable and will not be tolerated.   Please verify it is the Lyrica he wants sent in and not another medication- I do not have any other requests.   Thank you,  Laverna Peace MSN, AGNP-C, FNP-C  Family Nurse Practitioner  Adult Geriatric Nurse Practitioner

## 2020-12-29 NOTE — Telephone Encounter (Signed)
Pt called in about his medication taking so long to be filled. Advise Pt that NP is waiting on Labs. Pt stated that he dropped off the lab results on Dec 11, 2020. Pt stated that it shouldn't take long to fill his medication. Pt stated that he is out of medication. Pt was screaming and yelling. Pt was rude. Pt requesting callback.

## 2020-12-29 NOTE — Telephone Encounter (Signed)
Spoke with patient. He reports he has an appointment with his [place] PCP next week. He is ineligible for patient assistance through the manufacturer if he has VA coverage.   Will provide sample of Ozempic to tide patient over until he is able to establish with VA PCP  He also notes he is bringing most up to date lab work to clinic tomorrow.   Medication Samples have been labeled and logged for the patient.  Drug name: Ozempic       Strength: 0.25/0.5 mg        Qty: 1 pen  LOT: [id number]  Exp.Date: 05/10/2023  Dosing instructions: Inject 0.5 mg weekly  The patient has been instructed regarding the correct time, dose, and frequency of taking this medication, including desired effects and most common side effects.   De Hollingshead 1:57 PM 12/29/2020

## 2020-12-30 ENCOUNTER — Other Ambulatory Visit: Payer: Self-pay | Admitting: Adult Health

## 2020-12-30 DIAGNOSIS — E1165 Type 2 diabetes mellitus with hyperglycemia: Secondary | ICD-10-CM | POA: Diagnosis not present

## 2020-12-30 DIAGNOSIS — K429 Umbilical hernia without obstruction or gangrene: Secondary | ICD-10-CM | POA: Diagnosis not present

## 2020-12-30 DIAGNOSIS — E6609 Other obesity due to excess calories: Secondary | ICD-10-CM | POA: Diagnosis not present

## 2020-12-30 DIAGNOSIS — N529 Male erectile dysfunction, unspecified: Secondary | ICD-10-CM | POA: Diagnosis not present

## 2020-12-30 DIAGNOSIS — E291 Testicular hypofunction: Secondary | ICD-10-CM | POA: Diagnosis not present

## 2020-12-30 DIAGNOSIS — E7800 Pure hypercholesterolemia, unspecified: Secondary | ICD-10-CM | POA: Diagnosis not present

## 2020-12-30 MED ORDER — PREGABALIN 200 MG PO CAPS: 200.0000 mg | ORAL_CAPSULE | Freq: Three times a day (TID) | ORAL | 0 refills | Status: DC

## 2020-12-30 NOTE — Telephone Encounter (Signed)
Meds ordered this encounter  Medications   pregabalin (LYRICA) 200 MG capsule    Sig: Take 1 capsule (200 mg total) by mouth 3 (three) times daily.    Dispense:  90 capsule    Refill:  0   Kristal,  Will have labs abstracted and scanned to chart reviewed.   Schedule recheck CMP in 3 months please. Lab was ordered.  Neurontin he had decreased already at last visit and was discontinuing.  Take only as directed.  Call if concerns.  Follow up sooner than scheduled if any worsening or persisting symptoms.

## 2020-12-30 NOTE — Progress Notes (Signed)
Meds ordered this encounter  Medications   pregabalin (LYRICA) 200 MG capsule    Sig: Take 1 capsule (200 mg total) by mouth 3 (three) times daily.    Dispense:  90 capsule    Refill:  0     Medications Discontinued During This Encounter  Medication Reason   pregabalin (LYRICA) 150 MG capsule Completed Course   gabapentin (NEURONTIN) 600 MG tablet Completed Course     Orders Placed This Encounter  Procedures   Comprehensive metabolic panel    Standing Status:   Future    Standing Expiration Date:   12/30/2021

## 2020-12-30 NOTE — Telephone Encounter (Addendum)
Patient presented and picked up Ozempic sample.   He brought a copy of lab results from [facility]. He requested his pregabalin prescription before he left the office. I informed him that PCP was seeing patients this afternoon and I could not promise when the prescription would be filled. He requested it be sent before the weekend.   Collected 11/17/20 - creatinine 1.16, eGFR 71. Calculated CrCl (using male, age 65, weight 209 lbs, height 5'10") = 86 mL/min (using adjusted body weight CrCl = 74 mL/min), renal function appropriate for max daily dosing of pregabalin of 600 mg daily (200 mg TID).   Lab results given to PCP.

## 2020-12-31 ENCOUNTER — Other Ambulatory Visit: Payer: Self-pay | Admitting: Adult Health

## 2020-12-31 DIAGNOSIS — M62830 Muscle spasm of back: Secondary | ICD-10-CM

## 2020-12-31 MED ORDER — BACLOFEN 20 MG PO TABS: 20.0000 mg | ORAL_TABLET | Freq: Two times a day (BID) | ORAL | 0 refills | Status: DC

## 2020-12-31 NOTE — Telephone Encounter (Signed)
Meds ordered this encounter  Medications   baclofen (LIORESAL) 20 MG tablet    Sig: Take 1 tablet (20 mg total) by mouth 2 (two) times daily PRN muscle spasm Will cause drowsiness.    Dispense:  30 each    Refill:  0  Discontinued Baclofen 10 mg twice daily.  Take Baclofen only as needed. Caution as will cause drowsiness.  Follow treatment plan from office  if not improving or any worsening within 72 hours and also return to office or open medical facility at ANYTIME if any symptoms persist, change, or worsen or you have any further concerns or questions. Call 911 immediately for emergencies.

## 2020-12-31 NOTE — Progress Notes (Signed)
Meds ordered this encounter  Medications   baclofen (LIORESAL) 20 MG tablet    Sig: Take 1 tablet (20 mg total) by mouth 2 (two) times daily. Will cause drowsiness.    Dispense:  30 each    Refill:  0     Medications Discontinued During This Encounter  Medication Reason   baclofen (LIORESAL) 10 MG tablet Completed Course

## 2020-12-31 NOTE — Telephone Encounter (Signed)
Pt.notified

## 2020-12-31 NOTE — Telephone Encounter (Signed)
Placed call to pt to see if he picked up Lyrica and schedule recheck CMP. Pt said he'd get it rechecked at the [place]. Pt also ask about baclofen again and if there was going to prescribed an alternative or a different dose since the one he was taking didn't work for him. Please advise.

## 2021-01-06 ENCOUNTER — Telehealth: Payer: Self-pay | Admitting: Pharmacist

## 2021-01-06 ENCOUNTER — Ambulatory Visit (INDEPENDENT_AMBULATORY_CARE_PROVIDER_SITE_OTHER): Payer: PPO | Admitting: Pharmacist

## 2021-01-06 DIAGNOSIS — E782 Mixed hyperlipidemia: Secondary | ICD-10-CM

## 2021-01-06 DIAGNOSIS — E1165 Type 2 diabetes mellitus with hyperglycemia: Secondary | ICD-10-CM

## 2021-01-06 DIAGNOSIS — G629 Polyneuropathy, unspecified: Secondary | ICD-10-CM

## 2021-01-06 DIAGNOSIS — I739 Peripheral vascular disease, unspecified: Secondary | ICD-10-CM

## 2021-01-06 NOTE — Telephone Encounter (Signed)
Patient reported during CCM visit that he needed a copy of lab results from Dr. De Hollingshead office that he had previously dropped off. Reviewed that he should be able to see these abstracted results via [place]. Sent him this information via MyChart and instructed to let us know if he cannot view them, and we can print and mail to him or have available to pick up. He wants to have results available at the time of his [place] PCP visit on 01/15/2021.

## 2021-01-06 NOTE — Patient Instructions (Signed)
Visit Information   Following is a copy of your full care plan:  Care Plan : Medication Management  Updates made by De Hollingshead, RPH-CPP since 01/06/2021 12:00 AM     Problem: Diabetes, ASCVD      Long-Range Goal: Disease Progression Prevention   Note:   Current Barriers:  Unable to independently afford treatment regimen Complex patient with multiple comorbidities at high risk of exacerbation  Pharmacist Clinical Goal(s):  patient will verbalize ability to afford treatment regimen through collaboration with PharmD and provider.    Interventions: 1:1 collaboration with [name], [name], FNP regarding development and update of comprehensive plan of care as evidenced by provider attestation and co-signature Inter-disciplinary care team collaboration (see longitudinal plan of care) Comprehensive medication review performed; medication list updated in electronic medical record  Health Maintenance   Yearly diabetic eye exam: up to date Yearly diabetic foot exam: due Urine microalbumin: up to date Yearly influenza vaccination: due Td/Tdap vaccination: due Pneumonia vaccination: due COVID vaccinations: due Shingrix vaccinations: due Colonoscopy: due  Diabetes:  Controlled; current treatment: metformin 1000 mg twice daily, Jardiance 25 mg daily, Ozempic 0.5 mg weekly; Previously enrolled for patient assistance but now has VA coverage. Reports an upcoming appointment 01/15/21 with PCP. Has adequate medication supply to tide to that appointment.  Recommended to continue current regimen at this time. Ensure he collaborates with [place] team to understand pharmacy benefits. He verbalizes understanding  Hyperlipidemia and secondary ASCVD prevention:   Controlled; current treatment: atorvastatin 20 mg Antiplatelet regimen: clopidogrel 75 mg daily, aspirin 81 mg daily Recommended to continue current regimen at this time  Chronic pain: Uncontrolled; current regimen: pregabalin 200  mg three times daily, diclofenac 75 mg BID, baclofen 20 mg twice daily, acetaminophen 1500 mg twice daily GI protection: pantoprazole 40 mg daily Reviewed risk of GI bleed with combination of NSAID + DAPT. Encouraged to continue to monitor for GI bleeds. Appropriate to cntinue PPI  Low T: Treated per Dr. Ronnald Collum; current regimen: testosterone Q14 days per Dr. De Hollingshead office, though he is unsure the dose.  Recommended to continue collaboration with endocrinology at this time  Insomnia: Controlled per patient report; current regimen: zolpidem 10 mg QPM Recommended to continue current regimen at this time   Patient Goals/Self-Care Activities patient will:  - check glucose periodically, document, and provide at future appointments collaborate with provider on medication access solutions      Consent to CCM Services: Erik Martinez was given information about Chronic Care Management services including:  CCM service includes personalized support from designated clinical staff supervised by his physician, including individualized plan of care and coordination with other care providers 24/7 contact phone numbers for assistance for urgent and routine care needs. Service will only be billed when office clinical staff spend 20 minutes or more in a month to coordinate care. Only one practitioner may furnish and bill the service in a calendar month. The patient may stop CCM services at any time (effective at the end of the month) by phone call to the office staff. The patient will be responsible for cost sharing (co-pay) of up to 20% of the service fee (after annual deductible is met).  Patient agreed to services and verbal consent obtained.   Plan: Telephone follow up appointment with care management team member scheduled for:  pending medication access needs  Catie Darnelle Maffucci, PharmD, [place], CPP Clinical Pharmacist [place] at [facility] [phone]   Please call the care  guide team at [phone] if you  need to cancel or reschedule your appointment.   Patient verbalizes understanding of instructions provided today and agrees to view in [place].

## 2021-01-06 NOTE — Chronic Care Management (AMB) (Signed)
Chronic Care Management CCM Pharmacy Note  01/06/2021 Name:  Erik Martinez MRN:  [id number] DOB:  10-Dec-1955  Summary: - Reports he has an appointment with [facility] on 01/15/2021.   Recommendations/Changes made from today's visit: - Encouraged to continue current regimen at this time, collaborate with PA providers on medication access  Subjective: Erik MOUSEL is an 65 y.o. year old male who is a primary patient of Martinez, Erik Aline, [place].  The CCM team was consulted for assistance with disease management and care coordination needs.    Engaged with patient by telephone for follow up visit for pharmacy case management and/or care coordination services.   Objective:  Medications Reviewed Today     Reviewed by De Hollingshead, RPH-CPP (Pharmacist) on 01/06/21 at [phone]  Med List Status: <None>   Medication Order Taking? Sig Documenting Provider Last Dose Status Informant  acetaminophen (TYLENOL) 500 MG tablet [id number] Yes Take 1,500 mg by mouth 2 (two) times daily as needed for moderate pain or mild pain. [provider] Taking Active Self  aspirin EC 81 MG tablet [id number] Yes Take 1 tablet (81 mg total) by mouth daily. Swallow whole. Schnier, Dolores Lory, MD Taking Active Self  atorvastatin (LIPITOR) 20 MG tablet [id number] Yes Take 20 mg by mouth at bedtime. [provider] Taking Active Self  baclofen (LIORESAL) 20 MG tablet [id number] Yes Take 1 tablet (20 mg total) by mouth 2 (two) times daily. Will cause drowsiness. Martinez, Erik Aline, FNP Taking Active   ciclopirox (PENLAC) 8 % solution [id number] No Apply topically at bedtime. Apply over nail and surrounding skin. Apply daily over previous coat. After seven (7) days, may remove with alcohol and continue cycle.  Patient not taking: Reported on 01/06/2021   Erik Martinez, DPM Not Taking Active Self  clopidogrel (PLAVIX) 75 MG tablet [id number] Yes Take 1 tablet (75 mg total) by mouth daily. Schnier, Dolores Lory, MD  Taking Active Self  diclofenac (VOLTAREN) 75 MG EC tablet [id number] Yes Take 1 tablet (75 mg total) by mouth 2 (two) times daily as needed. Erik Haven, MD Taking Active Self  empagliflozin (JARDIANCE) 25 MG TABS tablet [id number] Yes Take 1 tablet (25 mg total) by mouth daily. Martinez, Erik Aline, FNP Taking Active Self  metFORMIN (GLUCOPHAGE) 1000 MG tablet [id number] Yes Take 1,000 mg by mouth 2 (two) times daily. [provider] Taking Active Self  Multiple Vitamin (MULTIVITAMIN WITH MINERALS) TABS tablet [id number] Yes Take 1 tablet by mouth in the morning. [provider] Taking Active Self  pantoprazole (PROTONIX) 40 MG tablet [id number] Yes Take 1 tablet (40 mg total) by mouth daily. Schnier, Dolores Lory, MD Taking Active Self  pregabalin (LYRICA) 200 MG capsule [id number] Yes Take 1 capsule (200 mg total) by mouth 3 (three) times daily. Martinez, Erik Aline, FNP Taking Active   Semaglutide,0.25 or 0.5MG /DOS, (OZEMPIC, 0.25 OR 0.5 MG/DOSE,) 2 MG/1.5ML SOPN [id number] Yes Inject 0.5 mg into the skin once a week. Erik Haven, MD Taking Active Self  tadalafil (CIALIS) 20 MG tablet [id number] Yes Take 20 mg by mouth daily as needed for erectile dysfunction. [provider] Taking Active Self  TESTOSTERONE CYPIONATE IM [id number] Yes Inject 1 Dose into the muscle every 14 (fourteen) days. [provider] Taking Active Self           Med Note Erik Aline Jan 06, 2021  9:42 AM) Erik Martinez from Dr. Ronnald Collum Q14 days  zolpidem (AMBIEN) 10 MG  tablet [id number] Yes Take 1 tablet (10 mg total) by mouth at bedtime as needed for sleep.  Patient taking differently: Take 10 mg by mouth at bedtime.   Erik Haven, MD Taking Active Self            Pertinent Labs:   Lab Results  Component Value Date   HGBA1C 6.7 11/19/2020   Lab Results  Component Value Date   CHOL 310 (A) 06/19/2020   HDL 49 06/19/2020   LDLCALC 196 06/19/2020   TRIG  323 (A) 06/19/2020   CHOLHDL 2 11/22/2017   Lab Results  Component Value Date   CREATININE 1.17 12/22/2020   BUN 27 (H) 12/22/2020   NA 138 11/19/2020   K 4.8 11/19/2020   CL 98 (A) 11/19/2020   CO2 25 (A) 11/19/2020    SDOH:  (Social Determinants of Health) assessments and interventions performed:  SDOH Interventions    Flowsheet Row Most Recent Value  SDOH Interventions   Financial Strain Interventions Other (Comment)  [[place] Coverage]       CCM Care Plan  Review of patient past medical history, allergies, medications, health status, including review of consultants reports, laboratory and other test data, was performed as part of comprehensive evaluation and provision of chronic care management services.   Care Plan : Medication Management  Updates made by De Hollingshead, RPH-CPP since 01/06/2021 12:00 AM     Problem: Diabetes, ASCVD      Long-Range Goal: Disease Progression Prevention   Note:   Current Barriers:  Unable to independently afford treatment regimen Complex patient with multiple comorbidities at high risk of exacerbation  Pharmacist Clinical Goal(s):  patient will verbalize ability to afford treatment regimen through collaboration with PharmD and provider.    Interventions: 1:1 collaboration with Martinez, Erik Aline, FNP regarding development and update of comprehensive plan of care as evidenced by provider attestation and co-signature Inter-disciplinary care team collaboration (see longitudinal plan of care) Comprehensive medication review performed; medication list updated in electronic medical record  Health Maintenance   Yearly diabetic eye exam: up to date Yearly diabetic foot exam: due Urine microalbumin: up to date Yearly influenza vaccination: due Td/Tdap vaccination: due Pneumonia vaccination: due COVID vaccinations: due Shingrix vaccinations: due Colonoscopy: due  Diabetes:  Controlled; current treatment: metformin 1000 mg twice  daily, Jardiance 25 mg daily, Ozempic 0.5 mg weekly; Previously enrolled for patient assistance but now has [facility] coverage. Reports an upcoming appointment 01/15/21 with PCP. Has adequate medication supply to tide to that appointment.  Recommended to continue current regimen at this time. Ensure he collaborates with [facility] team to understand pharmacy benefits. He verbalizes understanding  Hyperlipidemia and secondary ASCVD prevention:   Controlled; current treatment: atorvastatin 20 mg Antiplatelet regimen: clopidogrel 75 mg daily, aspirin 81 mg daily Recommended to continue current regimen at this time  Chronic pain: Uncontrolled; current regimen: pregabalin 200 mg three times daily, diclofenac 75 mg BID, baclofen 20 mg twice daily, acetaminophen 1500 mg twice daily GI protection: pantoprazole 40 mg daily Reviewed risk of GI bleed with combination of NSAID + DAPT. Encouraged to continue to monitor for GI bleeds. Appropriate to cntinue PPI  Low T: Treated per Dr. Ronnald Collum; current regimen: testosterone Q14 days per Dr. De Hollingshead office, though he is unsure the dose.  Recommended to continue collaboration with endocrinology at this time  Insomnia: Controlled per patient report; current regimen: zolpidem 10 mg QPM Recommended to continue current regimen at this time   Patient Goals/Self-Care Activities patient  will:  - check glucose periodically, document, and provide at future appointments collaborate with provider on medication access solutions       Plan: Telephone follow up appointment with care management team member scheduled for:  pending medication access needs  Catie Darnelle Maffucci, PharmD, [place], CPP Clinical Pharmacist [employer] at [facility] [phone]

## 2021-01-09 DIAGNOSIS — E1165 Type 2 diabetes mellitus with hyperglycemia: Secondary | ICD-10-CM

## 2021-01-09 DIAGNOSIS — E782 Mixed hyperlipidemia: Secondary | ICD-10-CM

## 2021-01-12 NOTE — Telephone Encounter (Signed)
Pt is stating the medication prescribed (Baclofen) for him is not working. Pt is wondering if he could have a referral to somewhere that will possibly give him a shot in his back or what should be the best route he takes?

## 2021-01-13 DIAGNOSIS — E6609 Other obesity due to excess calories: Secondary | ICD-10-CM | POA: Diagnosis not present

## 2021-01-13 DIAGNOSIS — E1165 Type 2 diabetes mellitus with hyperglycemia: Secondary | ICD-10-CM | POA: Diagnosis not present

## 2021-01-13 DIAGNOSIS — K429 Umbilical hernia without obstruction or gangrene: Secondary | ICD-10-CM | POA: Diagnosis not present

## 2021-01-13 DIAGNOSIS — E291 Testicular hypofunction: Secondary | ICD-10-CM | POA: Diagnosis not present

## 2021-01-13 DIAGNOSIS — N529 Male erectile dysfunction, unspecified: Secondary | ICD-10-CM | POA: Diagnosis not present

## 2021-01-13 DIAGNOSIS — E7800 Pure hypercholesterolemia, unspecified: Secondary | ICD-10-CM | POA: Diagnosis not present

## 2021-01-13 NOTE — Telephone Encounter (Signed)
Find out if he is not seeing orthopedics ?  I believe I refer him will you check under referrals?  We can try an alternate muscle relaxer such as flexeril or zanaflex as well, but if having persistent symptoms needs office visit.  Follow treatment plan from office  if not improving or any worsening within 72 hours and also return to office or open medical facility at ANYTIME if any symptoms persist, change, or worsen or you have any further concerns or questions. Call 911 immediately for emergencies.

## 2021-01-13 NOTE — Telephone Encounter (Signed)
Pt has appt scheduled with provider.

## 2021-01-13 NOTE — Telephone Encounter (Signed)
Pt calling in regards to previous message

## 2021-01-14 ENCOUNTER — Other Ambulatory Visit: Payer: Self-pay

## 2021-01-14 ENCOUNTER — Ambulatory Visit (INDEPENDENT_AMBULATORY_CARE_PROVIDER_SITE_OTHER): Payer: PPO | Admitting: Adult Health

## 2021-01-14 ENCOUNTER — Encounter (INDEPENDENT_AMBULATORY_CARE_PROVIDER_SITE_OTHER): Payer: Self-pay | Admitting: Vascular Surgery

## 2021-01-14 ENCOUNTER — Other Ambulatory Visit: Payer: Self-pay | Admitting: Adult Health

## 2021-01-14 ENCOUNTER — Encounter: Payer: Self-pay | Admitting: Adult Health

## 2021-01-14 ENCOUNTER — Ambulatory Visit (INDEPENDENT_AMBULATORY_CARE_PROVIDER_SITE_OTHER): Payer: PPO | Admitting: Vascular Surgery

## 2021-01-14 ENCOUNTER — Ambulatory Visit
Admission: RE | Admit: 2021-01-14 | Discharge: 2021-01-14 | Disposition: A | Discharge status: Home / Self Care | Payer: PPO | Source: Ambulatory Visit | Attending: Adult Health | Admitting: Adult Health

## 2021-01-14 VITALS — BP 162/71 | HR 80 | Resp 16 | Wt 207.8 lb

## 2021-01-14 VITALS — BP 122/74 | HR 85 | Ht 70.0 in | Wt 208.2 lb

## 2021-01-14 DIAGNOSIS — M79641 Pain in right hand: Secondary | ICD-10-CM

## 2021-01-14 DIAGNOSIS — M79642 Pain in left hand: Secondary | ICD-10-CM | POA: Diagnosis not present

## 2021-01-14 DIAGNOSIS — G4700 Insomnia, unspecified: Secondary | ICD-10-CM

## 2021-01-14 DIAGNOSIS — M47816 Spondylosis without myelopathy or radiculopathy, lumbar region: Secondary | ICD-10-CM | POA: Diagnosis not present

## 2021-01-14 DIAGNOSIS — M4722 Other spondylosis with radiculopathy, cervical region: Secondary | ICD-10-CM | POA: Diagnosis not present

## 2021-01-14 DIAGNOSIS — M62830 Muscle spasm of back: Secondary | ICD-10-CM

## 2021-01-14 DIAGNOSIS — M25559 Pain in unspecified hip: Secondary | ICD-10-CM

## 2021-01-14 DIAGNOSIS — G894 Chronic pain syndrome: Secondary | ICD-10-CM | POA: Diagnosis not present

## 2021-01-14 DIAGNOSIS — I739 Peripheral vascular disease, unspecified: Secondary | ICD-10-CM

## 2021-01-14 DIAGNOSIS — I70213 Atherosclerosis of native arteries of extremities with intermittent claudication, bilateral legs: Secondary | ICD-10-CM

## 2021-01-14 DIAGNOSIS — E1142 Type 2 diabetes mellitus with diabetic polyneuropathy: Secondary | ICD-10-CM

## 2021-01-14 DIAGNOSIS — K219 Gastro-esophageal reflux disease without esophagitis: Secondary | ICD-10-CM

## 2021-01-14 DIAGNOSIS — E1165 Type 2 diabetes mellitus with hyperglycemia: Secondary | ICD-10-CM

## 2021-01-14 DIAGNOSIS — E782 Mixed hyperlipidemia: Secondary | ICD-10-CM

## 2021-01-14 DIAGNOSIS — F4310 Post-traumatic stress disorder, unspecified: Secondary | ICD-10-CM

## 2021-01-14 MED ORDER — ZOLPIDEM TARTRATE 10 MG PO TABS: 10.0000 mg | ORAL_TABLET | Freq: Every evening | ORAL | 0 refills | Status: DC | PRN

## 2021-01-14 MED ORDER — LIDOCAINE 5 % EX PTCH: 1.0000 | MEDICATED_PATCH | CUTANEOUS | 0 refills | Status: DC

## 2021-01-14 MED ORDER — TIZANIDINE HCL 4 MG PO CAPS: 4.0000 mg | ORAL_CAPSULE | Freq: Three times a day (TID) | ORAL | 0 refills | Status: AC | PRN

## 2021-01-14 MED ORDER — OZEMPIC (0.25 OR 0.5 MG/DOSE) 2 MG/1.5ML ~~LOC~~ SOPN: 0.5000 mg | PEN_INJECTOR | SUBCUTANEOUS | 1 refills | Status: AC

## 2021-01-14 MED ORDER — TRAMADOL HCL 50 MG PO TABS: 50.0000 mg | ORAL_TABLET | Freq: Three times a day (TID) | ORAL | 0 refills | Status: DC | PRN

## 2021-01-14 NOTE — Progress Notes (Signed)
Negative for acute trauma of hip. Shows history of left hip surgery. 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  Does show vascular calcifications of which he is seeing Vascular surgeon already and had appointment today.

## 2021-01-14 NOTE — Patient Instructions (Signed)
Tramadol; Acetaminophen Tablets What is this medication? TRAMADOL; ACETAMINOPHEN (TRA ma dol; ea set a MEE noe fen) treats severe pain. It is prescribed when other pain medications have not worked or cannot be tolerated. It works by blocking pain signals in the brain. This medication is a combination of acetaminophen and an opioid. This medicine may be used for other purposes; ask your health care provider or pharmacist if you have questions. COMMON BRAND NAME(S): Ultracet What should I tell my care team before I take this medication? They need to know if you have any of these conditions: Brain tumor Frequently drink alcohol Head injury Heart disease Kidney disease or trouble passing urine Low adrenal gland function Lung disease, asthma, or breathing problems Seizures Stomach or intestine problems Substance use disorder Taken an MAOI like Marplan, Nardil, or Parnate in the last 14 days An unusual or allergic reaction to tramadol, acetaminophen, other medications, foods, dyes, or preservatives Pregnant or trying to get pregnant Breast-feeding How should I use this medication? Take this medication by mouth with a full glass of water. Follow the directions on the prescription label. If the medication upsets your stomach, take it with food or milk. Do not take your medication more often than directed. A special MedGuide will be given to you by the pharmacist with each prescription and refill. Be sure to read this information carefully each time. Talk to your care team about the use of this medication in children. Special care may be needed. This medication is not for use in children younger than 80 years of age. Do not give this medication to a child younger than 10 years of age after surgery to remove the tonsils and/or adenoids. Overdosage: If you think you have taken too much of this medicine contact a poison control center or emergency room at once. NOTE: This medicine is only for you. Do  not share this medicine with others. What if I miss a dose? If you miss a dose, take it as soon as you can. If it is almost time for your next dose, take only that dose. Do not take double or extra doses. What may interact with this medication? Do not take this medication with any of the following: Linezolid MAOIs like Marplan, Nardil, and Parnate Methylene blue Ozanimod Samidorphan This medication may also interact with the following: Alcohol Antihistamines for allergy, cough, and cold Atropine Certain antibiotics like erythromycin, clarithromycin, rifampin Certain antivirals for HIV or hepatitis Certain medications for anxiety or sleep Certain medications for bladder problems like oxybutynin, tolterodine Certain medications for depression like amitriptyline, bupropion, fluoxetine, paroxetine, sertraline Certain medications for fungal infections like ketoconazole, itraconazole, or posaconazole Certain medications for migraine headache like almotriptan, eletriptan, frovatriptan, naratriptan, rizatriptan, sumatriptan, zolmitriptan Certain medications for Parkinson disease like benztropine, trihexyphenidyl Certain medications for seizures like carbamazepine, phenobarbital, phenytoin, primidone Certain medications for stomach problems like dicyclomine, hyoscyamine Certain medications for travel sickness like scopolamine Digoxin Diuretics General anesthetics like halothane, isoflurane, methoxyflurane, propofol Ipratropium Medications that relax muscles for surgery Other medications with acetaminophen Other opioid medications for pain Phenothiazines like chlorpromazine, mesoridazine, prochlorperazine, thioridazine Quinidine Warfarin This list may not describe all possible interactions. Give your health care provider a list of all the medicines, herbs, non-prescription drugs, or dietary supplements you use. Also tell them if you smoke, drink alcohol, or use illegal drugs. Some items may  interact with your medicine. What should I watch for while using this medication? Tell your care team if your pain does not go away,  if it gets worse, or if you have new or a different type of pain. You may develop tolerance to this medication. Tolerance means that you will need a higher dose of the medication for pain relief. Tolerance is normal and is expected if you take this medication for a long time. Do not suddenly stop taking your medication because you may develop a severe reaction. Your body becomes used to the medication. This does NOT mean you are addicted. Addiction is a behavior related to getting and using a medication for a nonmedical reason. If you have pain, you have a medical reason to take pain medication. Your care team will tell you how much medication to take. If your care team wants you to stop the medication, the dose will be slowly lowered over time to avoid any side effects. There are different types of medications for pain. If you take more than one type at the same time, you may have more side effects. Give your care team a list of all medications you use. They will tell you how much medication to take. Do not take more medication than directed. Call emergency services if you have problems breathing. Naloxone is an emergency medication used for an opioid overdose. An overdose can happen if you take too much opioid. It can also happen if an opioid is taken with some other medications or substances such as alcohol. Know the symptoms of an overdose, like trouble breathing, unusually tired or sleepy, or not being able to respond or wake up. Make sure to tell caregivers and close contacts where your naloxone stored. Make sure they know how to use it. After naloxone is given, the person giving it must call emergency services. Naloxone is a temporary treatment. Repeat doses may be needed. Do not take other medications that contain acetaminophen with this medication. Many non-prescription  medications contain acetaminophen. Always read labels carefully. If you have questions, ask your care team. If you take too much acetaminophen, get medical help right away. Too much acetaminophen can be very dangerous and cause liver damage. Even if you do not have symptoms, it is important to get help right away. This medication may cause serious skin reactions. They can happen weeks to months after starting the medication. Contact your care team right away if you notice fevers or flu-like symptoms with a rash. The rash may be red or purple and then turn into blisters or peeling of the skin. Or, you might notice a red rash with swelling of the face, lips or lymph nodes in your neck or under your arms. This medication may affect your coordination, reaction time, or judgment. Do not drive or operate machinery until you know how this medication affects you. Sit up or stand slowly to reduce the risk of dizzy or fainting spells. Drinking alcohol with this medication can increase the risk of these side effects. This medication will cause constipation. If you do not have a bowel movement for 3 days, call your care team. Your mouth may get dry. Chewing sugarless gum or sucking hard candy and drinking plenty of water may help. Contact your care team if the problem does not go away or is severe. What side effects may I notice from receiving this medication? Side effects that you should report to your care team as soon as possible: Allergic reactions--skin rash, itching, hives, swelling of the face, lips, tongue, or throat CNS depression--slow or shallow breathing, shortness of breath, feeling faint, dizziness, confusion, trouble staying awake Liver  injury--right upper belly pain, loss of appetite, nausea, light-colored stool, dark yellow or brown urine, yellowing skin or eyes, unusual weakness or fatigue Low adrenal gland function--nausea, vomiting, loss of appetite, unusual weakness or fatigue, dizziness Low  blood pressure--dizziness, feeling faint or lightheaded, blurry vision Low blood sugar (hypoglycemia)--tremors or shaking, anxiety, sweating, cold or clammy skin, confusion, dizziness, rapid heartbeat Low sodium level--muscle weakness, fatigue, dizziness, headache, confusion Redness, blistering, peeling, or loosening of the skin, including inside the mouth Seizures Side effects that usually do not require medical attention (report to your care team if they continue or are bothersome): Constipation Dizziness Drowsiness Dry mouth Headache Nausea Trouble sleeping Upset stomach Vomiting This list may not describe all possible side effects. Call your doctor for medical advice about side effects. You may report side effects to FDA at 1-800-FDA-1088. Where should I keep my medication? Keep out of the reach of children and pets. This medication can be abused. Keep it in a safe place to protect it from theft. Do not share it with anyone. It is only for you. Selling or giving away this medication is dangerous and against the law. Store at room temperature between 20 and 25 degrees C (68 and 77 degrees F). Get rid of any unused medication after the expiration date. This medication may cause harm and death if it is taken by other adults, children, or pets. It is important to get rid of the medication as soon as you no longer need it or it is expired. You can do this in two ways: Take the medication to a medication take-back program. Check with your pharmacy or law enforcement to find a location. If you cannot return the medication, check the label or package insert to see if the medication should be thrown out in the garbage or flushed down the toilet. If you are not sure, ask your care team. If it is safe to put it in the trash, take the medication out of the container. Mix the medication with cat litter, dirt, coffee grounds, or other unwanted substance. Seal the mixture in a bag or container. Put it in  the trash. NOTE: This sheet is a summary. It may not cover all possible information. If you have questions about this medicine, talk to your doctor, pharmacist, or health care provider.  2022 Elsevier/Gold Standard (2020-07-22 00:00:00) Tizanidine Capsules or Tablets What is this medication? TIZANIDINE (tye ZAN i deen) treats muscle spasms. It works by relaxing your muscles, which reduces muscle stiffness. It belongs to a group of medications called muscle relaxants. This medicine may be used for other purposes; ask your health care provider or pharmacist if you have questions. COMMON BRAND NAME(S): Zanaflex What should I tell my care team before I take this medication? They need to know if you have any of these conditions: Kidney disease Liver disease Low blood pressure Mental health disease An unusual or allergic reaction to tizanidine, other medications, foods, dyes, or preservatives Pregnant or trying to get pregnant Breast-feeding How should I use this medication? Take this medication by mouth with water. Take it as directed on the prescription label. You can take it with or without food. You should always take it the same way. Keep taking this medication unless your care team tells you to stop. Stopping it too quickly can cause serious side effects. Talk to your care team about the use of this medication in children. Special care may be needed. Patients over 85 years of age may have a stronger  reaction and need a smaller dose. Overdosage: If you think you have taken too much of this medicine contact a poison control center or emergency room at once. NOTE: This medicine is only for you. Do not share this medicine with others. What if I miss a dose? If you miss a dose, take it as soon as you can. If it is almost time for your next dose, take only that dose. Do not take double or extra doses. What may interact with this medication? Do not take this medication with any of the  following: Ciprofloxacin Fluvoxamine Narcotic medications for cough Thiabendazole Viloxazine This medication may also interact with the following: Acyclovir Alcohol Antihistamines for allergy, cough, and cold Baclofen Birth control pills Certain medications for anxiety or sleep Certain medications for blood pressure, heart disease, irregular heartbeat like amiodarone, mexiletine, propafenone, verapamil Certain medications for depression like amitriptyline, fluoxetine, sertraline Certain medications for seizures like phenobarbital, primidone Cimetidine Clonidine Famotidine General anesthetics like halothane, isoflurane, methoxyflurane, propofol Guanfacine Medications for sleep Medications that relax muscles for surgery Methyldopa Narcotic medications for pain Phenothiazines like chlorpromazine, prochlorperazine, thioridazine Ticlopidine Zileuton This list may not describe all possible interactions. Give your health care provider a list of all the medicines, herbs, non-prescription drugs, or dietary supplements you use. Also tell them if you smoke, drink alcohol, or use illegal drugs. Some items may interact with your medicine. What should I watch for while using this medication? Visit your care team for regular checks on your progress. Tell your care team if your symptoms do not start to get better or if they get worse. You may get drowsy or dizzy. Do not drive, use machinery, or do anything that needs mental alertness until you know how this medication affects you. Do not stand up or sit up quickly, especially if you are an older patient. This reduces the risk of dizzy or fainting spells. Alcohol may interfere with the effects of this medication. Avoid alcoholic drinks. Your mouth may get dry. Chewing sugarless gum or sucking hard candy and drinking plenty of water may help. Contact your care team if the problem does not go away or is severe. What side effects may I notice from  receiving this medication? Side effects that you should report to your care team as soon as possible: Allergic reactions--skin rash, itching, hives, swelling of the face, lips, tongue, or throat CNS depression--slow or shallow breathing, shortness of breath, feeling faint, dizziness, confusion, trouble staying awake Hallucinations Liver injury--right upper belly pain, loss of appetite, nausea, light-colored stool, dark yellow or brown urine, yellowing skin or eyes, unusual weakness or fatigue Low blood pressure--dizziness, feeling faint or lightheaded, blurry vision Side effects that usually do not require medical attention (report to your care team if they continue or are bothersome): Constipation Dizziness Drowsiness Dry mouth Fatigue This list may not describe all possible side effects. Call your doctor for medical advice about side effects. You may report side effects to FDA at 1-800-FDA-1088. Where should I keep my medication? Keep out of the reach of children and pets. Store at room temperature between 15 and 30 degrees C (59 and 86 degrees F). Get rid of any unused medication after the expiration date. To get rid of medications that are no longer needed or have expired: Take the medication to a medication take-back program. Check with your pharmacy or law enforcement to find a location. If you cannot return the medication, check the label or package insert to see if the medication should  be thrown out in the garbage or flushed down the toilet. If you are not sure, ask your care team. If it is safe to put it in the trash, take the medication out of the container. Mix the medication with cat litter, dirt, coffee grounds, or other unwanted substance. Seal the mixture in a bag or container. Put it in the trash. NOTE: This sheet is a summary. It may not cover all possible information. If you have questions about this medicine, talk to your doctor, pharmacist, or health care provider.  2022  Elsevier/Gold Standard (2020-05-15 00:00:00) Acute Back Pain, Adult Acute back pain is sudden and usually short-lived. It is often caused by an injury to the muscles and tissues in the back. The injury may result from: A muscle, tendon, or ligament getting overstretched or torn. Ligaments are tissues that connect bones to each other. Lifting something improperly can cause a back strain. Wear and tear (degeneration) of the spinal disks. Spinal disks are circular tissue that provide cushioning between the bones of the spine (vertebrae). Twisting motions, such as while playing sports or doing yard work. A hit to the back. Arthritis. You may have a physical exam, lab tests, and imaging tests to find the cause of your pain. Acute back pain usually goes away with rest and home care. Follow these instructions at home: Managing pain, stiffness, and swelling Take over-the-counter and prescription medicines only as told by your health care provider. Treatment may include medicines for pain and inflammation that are taken by mouth or applied to the skin, or muscle relaxants. Your health care provider may recommend applying ice during the first 24-48 hours after your pain starts. To do this: Put ice in a plastic bag. Place a towel between your skin and the bag. Leave the ice on for 20 minutes, 2-3 times a day. Remove the ice if your skin turns bright red. This is very important. If you cannot feel pain, heat, or cold, you have a greater risk of damage to the area. If directed, apply heat to the affected area as often as told by your health care provider. Use the heat source that your health care provider recommends, such as a moist heat pack or a heating pad. Place a towel between your skin and the heat source. Leave the heat on for 20-30 minutes. Remove the heat if your skin turns bright red. This is especially important if you are unable to feel pain, heat, or cold. You have a greater risk of getting  burned. Activity  Do not stay in bed. Staying in bed for more than 1-2 days can delay your recovery. Sit up and stand up straight. Avoid leaning forward when you sit or hunching over when you stand. If you work at a desk, sit close to it so you do not need to lean over. Keep your chin tucked in. Keep your neck drawn back, and keep your elbows bent at a 90-degree angle (right angle). Sit high and close to the steering wheel when you drive. Add lower back (lumbar) support to your car seat, if needed. Take short walks on even surfaces as soon as you are able. Try to increase the length of time you walk each day. Do not sit, drive, or stand in one place for more than 30 minutes at a time. Sitting or standing for long periods of time can put stress on your back. Do not drive or use heavy machinery while taking prescription pain medicine. Use proper lifting  techniques. When you bend and lift, use positions that put less stress on your back: [place] your knees. Keep the load close to your body. Avoid twisting. Exercise regularly as told by your health care provider. Exercising helps your back heal faster and helps prevent back injuries by keeping muscles strong and flexible. Work with a physical therapist to make a safe exercise program, as recommended by your health care provider. Do any exercises as told by your physical therapist. Lifestyle Maintain a healthy weight. Extra weight puts stress on your back and makes it difficult to have good posture. Avoid activities or situations that make you feel anxious or stressed. Stress and anxiety increase muscle tension and can make back pain worse. Learn ways to manage anxiety and stress, such as through exercise. General instructions Sleep on a firm mattress in a comfortable position. Try lying on your side with your knees slightly bent. If you lie on your back, put a pillow under your knees. Keep your head and neck in a straight line with your spine (neutral  position) when using electronic equipment like smartphones or pads. To do this: Raise your smartphone or pad to look at it instead of bending your head or neck to look down. Put the smartphone or pad at the level of your face while looking at the screen. Follow your treatment plan as told by your health care provider. This may include: Cognitive or behavioral therapy. Acupuncture or massage therapy. Meditation or yoga. Contact a health care provider if: You have pain that is not relieved with rest or medicine. You have increasing pain going down into your legs or buttocks. Your pain does not improve after 2 weeks. You have pain at night. You lose weight without trying. You have a fever or chills. You develop nausea or vomiting. You develop abdominal pain. Get help right away if: You develop new bowel or bladder control problems. You have unusual weakness or numbness in your arms or legs. You feel faint. These symptoms may represent a serious problem that is an emergency. Do not wait to see if the symptoms will go away. Get medical help right away. Call your local emergency services (911 in the [place] Do not drive yourself to the hospital. Summary Acute back pain is sudden and usually short-lived. Use proper lifting techniques. When you bend and lift, use positions that put less stress on your back. Take over-the-counter and prescription medicines only as told by your health care provider, and apply heat or ice as told. This information is not intended to replace advice given to you by your health care provider. Make sure you discuss any questions you have with your health care provider. Document Revised: 03/20/2020 Document Reviewed: 03/20/2020 Elsevier Patient Education  [place].

## 2021-01-14 NOTE — Progress Notes (Signed)
Right hand within normal limits.

## 2021-01-14 NOTE — Progress Notes (Signed)
Left hand x ray within normal limits. Follow treatment plan from office  if not improving or any worsening within 72 hours and also return to office or open medical facility at ANYTIME if any symptoms persist, change, or worsen or you have any further concerns or questions. Call 911 immediately for emergencies.

## 2021-01-14 NOTE — Progress Notes (Signed)
MRN : [id number]  Erik Martinez is a 66 y.o. ([birth year]) male who presents with chief complaint of check legs.  History of Present Illness:   The patient returns to the office for followup and review status post angiogram with intervention on 11/17/2020:   Procedure Percutaneous transluminal angioplasty and stent placement left popliteal artery.  He is also s/p angiogram of the right lower extremity on 12/22/2020 which was diagnostic.    The patient notes improvement in the left lower extremity symptoms. No interval shortening of the patient's claudication distance or rest pain symptoms. Previous wounds have now healed.  No new ulcers or wounds have occurred since the last visit.   Today he is noting a new problem.  Both of his hands are becoming numb and painful particularly when he is asleep.  The symptoms are episodic but are occurring frequently.  He works in a [employer] and notes he is having a little more difficulty holding tools with his right hand.  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   The patient denies amaurosis fugax or recent TIA symptoms. There are no recent neurological changes noted. The patient denies history of DVT, PE or superficial thrombophlebitis. The patient denies recent episodes of angina or shortness of breath.   Current Meds  Medication Sig   acetaminophen (TYLENOL) 500 MG tablet Take 1,500 mg by mouth 2 (two) times daily as needed for moderate pain or mild pain.   aspirin EC 81 MG tablet Take 1 tablet (81 mg total) by mouth daily. Swallow whole.   atorvastatin (LIPITOR) 20 MG tablet Take 20 mg by mouth at bedtime.   clopidogrel (PLAVIX) 75 MG tablet Take 1 tablet (75 mg total) by mouth daily.   diclofenac (VOLTAREN) 75 MG EC tablet Take 1 tablet (75 mg total) by mouth 2 (two) times daily as needed.   empagliflozin (JARDIANCE) 25 MG TABS tablet Take 1 tablet (25 mg total) by mouth daily.   lidocaine (LIDODERM) 5 % Place 1 patch onto the  skin daily. Remove & Discard patch within 12 hours or as directed by MD   metFORMIN (GLUCOPHAGE) 1000 MG tablet Take 1,000 mg by mouth 2 (two) times daily.   Multiple Vitamin (MULTIVITAMIN WITH MINERALS) TABS tablet Take 1 tablet by mouth in the morning.   pantoprazole (PROTONIX) 40 MG tablet Take 1 tablet (40 mg total) by mouth daily.   pregabalin (LYRICA) 200 MG capsule Take 1 capsule (200 mg total) by mouth 3 (three) times daily.   Semaglutide,0.25 or 0.5MG /DOS, (OZEMPIC, 0.25 OR 0.5 MG/DOSE,) 2 MG/1.5ML SOPN Inject 0.5 mg into the skin once a week.   tadalafil (CIALIS) 20 MG tablet Take 20 mg by mouth daily as needed for erectile dysfunction.   TESTOSTERONE CYPIONATE IM Inject 1 Dose into the muscle every 14 (fourteen) days.   tiZANidine (ZANAFLEX) 4 MG capsule Take 1 capsule (4 mg total) by mouth 3 (three) times daily as needed for muscle spasms (will cause drowsiness.).   traMADol (ULTRAM) 50 MG tablet Take 1 tablet (50 mg total) by mouth every 8 (eight) hours as needed for up to 15 doses.   zolpidem (AMBIEN) 10 MG tablet Take 1 tablet (10 mg total) by mouth at bedtime as needed for sleep.    Past Medical History:  Diagnosis Date   Anxiety    Arthritis    Avascular necrosis of bones of both hips ([place])    Bipolar disorder ([place])    Bladder  spasms    Chronic pain of left knee    Chronic pain syndrome    PAIN CLINIC   Cocaine use disorder, moderate, dependence ([place]) 09/09/2015   DDD (degenerative disc disease)    SPINE   Depression    Diabetes mellitus without complication (HCC)    Frequency-urgency syndrome    GERD (gastroesophageal reflux disease)    Hepatitis    Hepatitis    History of hepatitis B    1983  TX'D--  NO ISSUES OR SYMPTOMS SINCE   Hypotestosteronemia    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   Nocturia    PONV (postoperative nausea and vomiting)    "difficult to put me to sleep"   Spinal headache    Urine incontinence     Past Surgical History:  Procedure  Laterality Date   BACK SURGERY     2 rods and 4 screws artificial disc   INTERSTIM IMPLANT PLACEMENT  2007   INTERSTIM IMPLANT REVISION N/A 08/17/2012   Procedure: REPLACMENT OF IPG PLUS REPLACE LEAD OF INTERSTIM IMPLANT ;  Surgeon: [name], MD;  Location: [facility];  Service: Urology;  Laterality: N/A;   JOINT REPLACEMENT     Lt knee and Lt hip   LOWER EXTREMITY ANGIOGRAPHY Left 11/17/2020   Procedure: LOWER EXTREMITY ANGIOGRAPHY;  Surgeon: Katha Cabal, MD;  Location: [facility];  Service: Cardiovascular;  Laterality: Left;   LOWER EXTREMITY ANGIOGRAPHY Right 12/22/2020   Procedure: LOWER EXTREMITY ANGIOGRAPHY;  Surgeon: Katha Cabal, MD;  Location: [facility];  Service: Cardiovascular;  Laterality: Right;   LUMBAR DISC SURGERY  2005   L5   LUMBAR FUSION  X2  2006  &  2007   OPEN DEBRIDEMENT LEFT TOTAL KNEE (SCAR, BONEY GRAOWTH)/ REMOVAL OLD SUTURES  05-01-2007   PARTIAL HIP ARTHROPLASTY Left 2001   x2   SHOULDER OPEN ROTATOR CUFF REPAIR Left 2000   TOTAL HIP ARTHROPLASTY Left 2002   TOTAL KNEE ARTHROPLASTY Left    PARTIAL LEFT KNEE REPLACEMENT PRIOR TO THIS   TOTAL KNEE REVISION Left 2008   TRIGGER FINGER RELEASE Bilateral    SEVERAL FINGERS   TRIGGER FINGER RELEASE Right 06/18/2014   Procedure: RELEASE TRIGGER FINGER/A-1 PULLEY;  Surgeon: [name], MD;  Location: [facility];  Service: Orthopedics;  Laterality: Right;    Social History Social History   Tobacco Use   Smoking status: Never   Smokeless tobacco: Never  Vaping Use   Vaping Use: Never used  Substance Use Topics   Alcohol use: Not Currently    Alcohol/week: 0.0 - 2.0 standard drinks   Drug use: Not Currently    Types: Cocaine    Comment: pt states its been years since he used cocaine    Family History Family History  Problem Relation Age of Onset   Diabetes Mother    Arthritis Mother    Cancer Mother    Hyperlipidemia Mother    Stroke Mother     Arthritis Father    Cancer Father     Allergies  Allergen Reactions   Suboxone [Buprenorphine Hcl-Naloxone Hcl] Nausea And Vomiting   Tape Other (See Comments)    [place] *Paper Tape is ok    Codeine Rash, Itching and Nausea Only   Morphine And Related Itching and Rash   Other Rash    Telemetry electrodes     REVIEW OF SYSTEMS (Negative unless checked)  Constitutional: [] Weight loss  [] Fever  [] Chills Cardiac: [] Chest  pain   [] Chest pressure   [] Palpitations   [] Shortness of breath when laying flat   [] Shortness of breath with exertion. Vascular:  [] Pain in legs with walking   [] Pain in legs at rest  [] History of DVT   [] Phlebitis   [] Swelling in legs   [] Varicose veins   [] Non-healing ulcers Pulmonary:   [] Uses home oxygen   [] Productive cough   [] Hemoptysis   [] Wheeze  [] COPD   [] Asthma Neurologic:  [] Dizziness   [] Seizures   [] History of stroke   [] History of TIA  [] Aphasia   [] Vissual changes   [x] Weakness or numbness in arm   [x] Weakness or numbness in leg Musculoskeletal:   [] Joint swelling   [] Joint pain   [x] Low back pain Hematologic:  [] Easy bruising  [] Easy bleeding   [] Hypercoagulable state   [] Anemic Gastrointestinal:  [] Diarrhea   [] Vomiting  [x] Gastroesophageal reflux/heartburn   [] Difficulty swallowing. Genitourinary:  [] Chronic kidney disease   [] Difficult urination  [] Frequent urination   [] Blood in urine Skin:  [] Rashes   [] Ulcers  Psychological:  [] History of anxiety   []  History of major depression.  Physical Examination  Vitals:   01/14/21 1624  BP: (!) 162/71  Pulse: 80  Resp: 16  Weight: 207 lb 12.8 oz (94.3 kg)   Body mass index is 29.82 kg/m. Gen: WD/WN, NAD Head: [place]/AT, No temporalis wasting.  Ear/Nose/Throat: Hearing grossly intact, nares w/o erythema or drainage Eyes: PER, EOMI, sclera nonicteric.  Neck: Supple, no masses.  No bruit or JVD.  Pulmonary:  Good air movement, no audible wheezing, no use of accessory muscles.  Cardiac: RRR, normal  S1, S2, no Murmurs. Vascular:   Vessel Right Left  Radial Palpable Palpable  PT Palpable Palpable  DP Palpable Palpable  Gastrointestinal: soft, non-distended. No guarding/no peritoneal signs.  Musculoskeletal: M/S 5/5 throughout.  No visible deformity.  Neurologic: CN 2-12 intact. Pain and light touch intact in extremities.  Symmetrical.  Speech is fluent. Motor exam as listed above. Psychiatric: Judgment intact, Mood & affect appropriate for pt's clinical situation. Dermatologic: No rashes or ulcers noted.  No changes consistent with cellulitis.   CBC Lab Results  Component Value Date   WBC 7.6 11/19/2020   HGB 13.8 11/19/2020   HCT 43 11/19/2020   MCV 88.8 11/22/2017   PLT 217 11/19/2020    BMET    Component Value Date/Time   NA 138 11/19/2020 0000   NA 142 05/23/2013 0329   K 4.8 11/19/2020 0000   K 3.8 05/23/2013 0329   CL 98 (A) 11/19/2020 0000   CL 109 (H) 05/23/2013 0329   CO2 25 (A) 11/19/2020 0000   CO2 27 05/23/2013 0329   GLUCOSE 142 (H) 11/22/2017 0821   GLUCOSE 151 (H) 05/23/2013 0329   BUN 27 (H) 12/22/2020 0715   BUN 19 11/19/2020 0000   BUN 5 (L) 05/23/2013 0329   CREATININE 1.17 12/22/2020 0715   CREATININE 0.98 05/27/2013 0301   CALCIUM 9.6 11/19/2020 0000   CALCIUM 8.5 05/23/2013 0329   GFRNONAA >60 12/22/2020 0715   GFRNONAA >60 05/27/2013 0301   GFRAA >60 12/04/2016 0052   GFRAA >60 05/27/2013 0301   CrCl cannot be calculated (Patient's most recent lab result is older than the maximum 21 days allowed.).  COAG Lab Results  Component Value Date   INR 0.95 10/17/2016   INR 1.04 07/15/2016   INR 1.0 04/30/2007    Radiology PERIPHERAL VASCULAR CATHETERIZATION  Result Date: 12/22/2020 See surgical note for result.  DG  Foot Complete Right  Result Date: 12/25/2020 Please see detailed radiograph report in office note.    Assessment/Plan 1. Atherosclerosis of native artery of both lower extremities with intermittent claudication  (HCC) Recommend:  The patient is status post successful angiogram with intervention.  The patient reports that the claudication symptoms and leg pain is essentially gone.   The patient denies lifestyle limiting changes at this point in time.  No further invasive studies, angiography or surgery at this time.  I will see him back in April prior to his left knee surgery.  The patient should continue walking and begin a more formal exercise program.  The patient should continue antiplatelet therapy and aggressive treatment of the lipid abnormalities  Smoking cessation was again discussed  Patient should undergo noninvasive studies as ordered. The patient will follow up with me after the studies.    - VAS [place] LOWER EXTREMITY ARTERIAL DUPLEX; Future - VAS [place] ABI WITH/WO TBI; Future  2. Osteoarthritis of spine with radiculopathy, cervical region I will refer to Neurosurgery  - Ambulatory referral to Neurosurgery  3. Type 2 diabetes mellitus with diabetic polyneuropathy, without long-term current use of insulin (HCC) Continue hypoglycemic medications as already ordered, these medications have been reviewed and there are no changes at this time.  Hgb A1C to be monitored as already arranged by primary service   4. Gastroesophageal reflux disease without esophagitis Continue PPI as already ordered, this medication has been reviewed and there are no changes at this time.  Avoidence of caffeine and alcohol  Moderate elevation of the head of the bed    5. Mixed hyperlipidemia Continue statin as ordered and reviewed, no changes at this time     [name], MD  01/14/2021 4:38 PM

## 2021-01-14 NOTE — Progress Notes (Signed)
T12 compression fracture seen again.  No acute fracture.  L4-L5 surgical hardware seen.  Worsening of degenerative changes of the lumbar spine at L2-L3. For this I recommend we get an MRI of back, verify no allergy to contrast and no implantable device ? Claustrophobic ? Please have him verify and let me know and I will place order for MRI.  Will have to get insurance approval.  Will likely need a consult with neurosurgeon after MRI.   Will have him follow up with Dr. Harlow Mares for hip x ray /pain and his knee pain chronic since he is already seeing him. Let me know if new referral is needed. CC: Dr. Harlow Mares note.

## 2021-01-14 NOTE — Progress Notes (Signed)
Acute Office Visit  Subjective:    Patient ID: Erik Martinez, male    DOB: 02/29/1955, 66 y.o.   MRN: [id number]  Chief Complaint  Patient presents with   Back Pain    HPI Patient is in today for back pain . He has extensive back pain and history of lumbar fusion in past. Baclofen does not seem to be helping muscle spasms. He would like totry something different.  He would like something to help with daily pain.  Tylenol and Ibuprofen not relieving.   He has chronic knee pain, bilateral hip and hands pain. Seeing Dr. Harlow Mares for his knees.  He is seeing Vein and Vascular Dr. Delana Meyer for his arterial sclerosis. Has follow up today.  He would like pain management referral.   Patient  denies any fever,chills, rash, chest pain, shortness of breath, nausea, vomiting, or diarrhea.  Denies dizziness, lightheadedness, pre syncopal or syncopal episodes.    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   Nocturia    PONV (postoperative nausea and vomiting)    "difficult to put me to sleep"   Spinal headache    Urine incontinence     Past Surgical History:  Procedure Laterality Date   BACK SURGERY     2 rods and 4 screws artificial disc   INTERSTIM IMPLANT PLACEMENT  2007   INTERSTIM IMPLANT REVISION N/A 08/17/2012   Procedure: REPLACMENT OF IPG PLUS REPLACE LEAD OF INTERSTIM IMPLANT ;  Surgeon: [name], MD;  Location: [facility];  Service: Urology;  Laterality: N/A;   JOINT REPLACEMENT     Lt knee and Lt hip   LOWER EXTREMITY ANGIOGRAPHY Left 11/17/2020   Procedure: LOWER EXTREMITY ANGIOGRAPHY;  Surgeon: Katha Cabal, MD;  Location: [facility];  Service: Cardiovascular;  Laterality: Left;   LOWER EXTREMITY ANGIOGRAPHY Right 12/22/2020   Procedure: LOWER EXTREMITY ANGIOGRAPHY;  Surgeon: Katha Cabal, MD;  Location: [facility];  Service: Cardiovascular;  Laterality: Right;   LUMBAR DISC SURGERY  2005   L5   LUMBAR FUSION  X2  2006  &  2007   OPEN DEBRIDEMENT LEFT TOTAL KNEE (SCAR, BONEY GRAOWTH)/ REMOVAL OLD SUTURES  05-01-2007   PARTIAL HIP ARTHROPLASTY Left 2001   x2   SHOULDER OPEN ROTATOR CUFF REPAIR Left 2000   TOTAL HIP ARTHROPLASTY Left 2002   TOTAL KNEE ARTHROPLASTY Left    PARTIAL LEFT KNEE REPLACEMENT PRIOR TO THIS   TOTAL KNEE REVISION Left 2008   TRIGGER FINGER RELEASE Bilateral    SEVERAL FINGERS   TRIGGER FINGER RELEASE Right 06/18/2014   Procedure: RELEASE TRIGGER FINGER/A-1 PULLEY;  Surgeon: [name], MD;  Location: [facility];  Service: Orthopedics;  Laterality: Right;  Family History  Problem Relation Age of Onset   Diabetes Mother    Arthritis Mother    Cancer Mother    Hyperlipidemia Mother    Stroke Mother    Arthritis Father    Cancer Father     Social History   Socioeconomic History   Marital status: Divorced    Spouse name: Not on file   Number of children: Not on file   Years of education: Not on file   Highest education level: Not on file  Occupational History   Not on file  Tobacco Use   Smoking status: Never   Smokeless tobacco: Never  Vaping Use   Vaping Use: Never used  Substance and Sexual Activity   Alcohol use: Not Currently    Alcohol/week: 0.0 - 2.0 standard drinks   Drug use: Not Currently    Types: Cocaine    Comment: pt states its been years since he used cocaine    Sexual activity: Not Currently  Other Topics Concern   Not on file  Social History Narrative   Not on file   Social Determinants of Health   Financial Resource Strain: Low Risk    Difficulty of Paying Living Expenses: Not hard at all  Food Insecurity: Not on file  Transportation Needs: Not on file  Physical Activity: Not on file  Stress: Not on file  Social Connections: Not on file  Intimate Partner Violence: Not on file    Outpatient Medications Prior to Visit  Medication Sig Dispense Refill   acetaminophen (TYLENOL) 500 MG tablet Take 1,500 mg by mouth 2 (two) times daily as needed for moderate pain or mild pain.     aspirin EC 81 MG tablet Take 1 tablet (81 mg total) by mouth daily. Swallow whole. 150 tablet 2   atorvastatin (LIPITOR) 20 MG tablet Take 20 mg by mouth at bedtime.     ciclopirox (PENLAC) 8 % solution Apply topically at bedtime. Apply over nail and surrounding skin. Apply daily over previous coat. After seven (7) days, may remove with alcohol and continue cycle. (Patient not taking: Reported on 01/06/2021) 6.6 mL 0   clopidogrel (PLAVIX) 75 MG tablet Take 1 tablet (75 mg total) by mouth daily. 30 tablet 11   diclofenac (VOLTAREN) 75 MG EC tablet Take 1 tablet (75 mg total) by mouth 2 (two) times daily as needed. 60 tablet 2   empagliflozin (JARDIANCE) 25 MG TABS tablet Take 1 tablet (25 mg total) by mouth daily. 90 tablet 1   metFORMIN (GLUCOPHAGE) 1000 MG tablet Take 1,000 mg by mouth 2 (two) times daily.     Multiple Vitamin (MULTIVITAMIN WITH MINERALS) TABS tablet Take 1 tablet by mouth in the morning.     pantoprazole (PROTONIX) 40 MG tablet Take 1 tablet (40 mg total) by mouth daily. 90 tablet 3   pregabalin (LYRICA) 200 MG capsule Take 1 capsule (200 mg total) by mouth 3 (three) times daily. 90 capsule 0   tadalafil (CIALIS) 20 MG tablet Take 20 mg by mouth daily as needed for erectile dysfunction.     TESTOSTERONE CYPIONATE IM Inject 1 Dose into the muscle  every 14 (fourteen) days.     baclofen (LIORESAL) 20 MG tablet Take 1 tablet (20 mg total) by mouth 2 (two) times daily. Will cause drowsiness. 30 each 0   Semaglutide,0.25 or 0.5MG /DOS, (OZEMPIC, 0.25 OR 0.5 MG/DOSE,) 2 MG/1.5ML SOPN Inject 0.5 mg into the skin once a week. 6 mL 1   zolpidem (  AMBIEN) 10 MG tablet Take 1 tablet (10 mg total) by mouth at bedtime as needed for sleep. (Patient taking differently: Take 10 mg by mouth at bedtime.) 30 tablet 0   No facility-administered medications prior to visit.    Allergies  Allergen Reactions   Suboxone [Buprenorphine Hcl-Naloxone Hcl] Nausea And Vomiting   Tape Other (See Comments)    [place]ps *Paper Tape is ok    Codeine Rash, Itching and Nausea Only   Morphine And Related Itching and Rash   Other Rash    Telemetry electrodes    Review of Systems  Constitutional: Negative.   HENT: Negative.    Respiratory: Negative.    Cardiovascular: Negative.   Gastrointestinal: Negative.   Genitourinary: Negative.   Musculoskeletal:  Positive for arthralgias, back pain and myalgias. Negative for gait problem, joint swelling, neck pain and neck stiffness.  Neurological: Negative.   Psychiatric/Behavioral: Negative.        Objective:    Physical Exam Vitals reviewed.  Constitutional:      Appearance: He is well-developed. He is not ill-appearing or diaphoretic.  HENT:     Head: Normocephalic and atraumatic.  Cardiovascular:     Rate and Rhythm: Regular rhythm.     Heart sounds: Normal heart sounds.  Pulmonary:     Effort: Pulmonary effort is normal. No respiratory distress.     Breath sounds: Normal breath sounds. No wheezing or rales.  Musculoskeletal:     Lumbar back: No swelling, spasms or tenderness. Normal range of motion.     Right lower leg: No edema.     Left lower leg: No edema.     Comments: Full range of motion with flexion, extension, lateral side bends. No pain, numbness, tingling elicited with single leg raise  bilaterally. No rash.  Skin:    General: Skin is warm and dry.  Neurological:     General: No focal deficit present.     Mental Status: He is alert and oriented to person, place, and time.  Psychiatric:        Speech: Speech normal.        Behavior: Behavior normal.    BP 122/74    Pulse 85    Ht 5\' 10"  (1.778 m)    Wt 208 lb 3.2 oz (94.4 kg)    SpO2 96%    BMI 29.87 kg/m  Wt Readings from Last 3 Encounters:  01/14/21 207 lb 12.8 oz (94.3 kg)  01/14/21 208 lb 3.2 oz (94.4 kg)  12/22/20 209 lb (94.8 kg)    Health Maintenance Due  Topic Date Due   Pneumococcal Vaccine 51-41 Years old (1 - PCV) Never done   FOOT EXAM  Never done   URINE MICROALBUMIN  Never done   TETANUS/TDAP  Never done   Zoster Vaccines- Shingrix (1 of 2) Never done   COLONOSCOPY (Pts 45-80yrs Insurance coverage will need to be confirmed)  Never done   COVID-19 Vaccine (4 - Booster for Moderna series) 03/05/2020   INFLUENZA VACCINE  Never done    There are no preventive care reminders to display for this patient.   Lab Results  Component Value Date   TSH 1.58 06/19/2020   Lab Results  Component Value Date   WBC 7.6 11/19/2020   HGB 13.8 11/19/2020   HCT 43 11/19/2020   MCV 88.8 11/22/2017   PLT 217 11/19/2020   Lab Results  Component Value Date   NA 138 11/19/2020   K 4.8 11/19/2020  CO2 25 (A) 11/19/2020   GLUCOSE 142 (H) 11/22/2017   BUN 27 (H) 12/22/2020   CREATININE 1.17 12/22/2020   BILITOT 0.5 11/22/2017   ALKPHOS 55 11/19/2020   AST 26 11/19/2020   ALT 20 11/19/2020   PROT 7.0 11/22/2017   ALBUMIN 4.8 11/19/2020   CALCIUM 9.6 11/19/2020   ANIONGAP 12 12/04/2016   GFR 71.58 11/22/2017   Lab Results  Component Value Date   CHOL 310 (A) 06/19/2020   Lab Results  Component Value Date   HDL 49 06/19/2020   Lab Results  Component Value Date   LDLCALC 196 06/19/2020   Lab Results  Component Value Date   TRIG 323 (A) 06/19/2020   Lab Results  Component Value Date    CHOLHDL 2 11/22/2017   Lab Results  Component Value Date   HGBA1C 6.7 11/19/2020       Assessment & Plan:   Problem List Items Addressed This Visit       Cardiovascular and Mediastinum   PAD (peripheral artery disease) ([place])    Followed by [facility].       Atherosclerosis of native arteries of extremity with intermittent claudication (HCC)   Relevant Medications   traMADol (ULTRAM) 50 MG tablet   tiZANidine (ZANAFLEX) 4 MG capsule     Endocrine   Type 2 diabetes mellitus with diabetic polyneuropathy, without long-term current use of insulin (HCC)   Relevant Medications   Semaglutide,0.25 or 0.5MG /DOS, (OZEMPIC, 0.25 OR 0.5 MG/DOSE,) 2 MG/1.5ML SOPN   zolpidem (AMBIEN) 10 MG tablet   tiZANidine (ZANAFLEX) 4 MG capsule     Other   Chronic pain syndrome   Relevant Medications   traMADol (ULTRAM) 50 MG tablet   tiZANidine (ZANAFLEX) 4 MG capsule   Other Relevant Orders   Ambulatory referral to Pain Clinic   Hip pain   Relevant Medications   lidocaine (LIDODERM) 5 %   Other Relevant Orders   DG Hip Unilat W OR W/O Pelvis 2-3 Views Right (Completed)   DG Hip Unilat W OR W/O Pelvis 2-3 Views Left (Completed)   PTSD (post-traumatic stress disorder)   Insomnia   Relevant Medications   zolpidem (AMBIEN) 10 MG tablet   Back muscle spasm - Primary   Relevant Medications   traMADol (ULTRAM) 50 MG tablet   tiZANidine (ZANAFLEX) 4 MG capsule   Other Relevant Orders   Ambulatory referral to Pain Clinic   DG Lumbar Spine Complete (Completed)   Bilateral hand pain   Relevant Orders   DG Hand Complete Right (Completed)   DG Hand Complete Left (Completed)     Meds ordered this encounter  Medications   lidocaine (LIDODERM) 5 %    Sig: Place 1 patch onto the skin daily. Remove & Discard patch within 12 hours or as directed by MD    Dispense:  30 patch    Refill:  0   Semaglutide,0.25 or 0.5MG /DOS, (OZEMPIC, 0.25 OR 0.5 MG/DOSE,) 2 MG/1.5ML SOPN    Sig:  Inject 0.5 mg into the skin once a week.    Dispense:  1.5 mL    Refill:  1   zolpidem (AMBIEN) 10 MG tablet    Sig: Take 1 tablet (10 mg total) by mouth at bedtime as needed for sleep.    Dispense:  30 tablet    Refill:  0   traMADol (ULTRAM) 50 MG tablet    Sig: Take 1 tablet (50 mg total) by mouth every 8 (eight) hours  as needed for up to 15 doses.    Dispense:  15 tablet    Refill:  0   tiZANidine (ZANAFLEX) 4 MG capsule    Sig: Take 1 capsule (4 mg total) by mouth 3 (three) times daily as needed for muscle spasms (will cause drowsiness.).    Dispense:  90 capsule    Refill:  0   Orders Placed This Encounter  Procedures   DG Lumbar Spine Complete    Order Specific Question:   Reason for Exam (SYMPTOM  OR DIAGNOSIS REQUIRED)    Answer:   back pain    Order Specific Question:   Preferred imaging location?    Answer:   [facility]   DG Hip Unilat W OR W/O Pelvis 2-3 Views Right    Order Specific Question:   Reason for Exam (SYMPTOM  OR DIAGNOSIS REQUIRED)    Answer:   hip pain    Order Specific Question:   Preferred imaging location?    Answer:   [facility]   DG Hip Unilat W OR W/O Pelvis 2-3 Views Left    Order Specific Question:   Reason for Exam (SYMPTOM  OR DIAGNOSIS REQUIRED)    Answer:   left hip    Order Specific Question:   Preferred imaging location?    Answer:   [facility]   DG Hand Complete Right    Order Specific Question:   Reason for Exam (SYMPTOM  OR DIAGNOSIS REQUIRED)    Answer:   hand pain and stiffness.    Order Specific Question:   Preferred imaging location?    Answer:   [facility]   DG Hand Complete Left    Order Specific Question:   Reason for Exam (SYMPTOM  OR DIAGNOSIS REQUIRED)    Answer:   left hand pain    Order Specific Question:   Preferred imaging location?    Answer:   [facility]   Ambulatory referral to Pain Clinic    Referral Priority:   Urgent    Referral Type:   Consultation    Referral Reason:    Specialty Services Required    Requested Specialty:   Pain Medicine    Number of Visits Requested:   1    Medications Discontinued During This Encounter  Medication Reason   baclofen (LIORESAL) 20 MG tablet Completed Course   zolpidem (AMBIEN) 10 MG tablet Reorder   Semaglutide,0.25 or 0.5MG /DOS, (OZEMPIC, 0.25 OR 0.5 MG/DOSE,) 2 MG/1.5ML SOPN Reorder     Red Flags discussed. The patient was given clear instructions to go to ER or return to medical center if any red flags develop, symptoms do not improve, worsen or new problems develop. They verbalized understanding.  Return in about 1 month (around 02/14/2021), or if symptoms worsen or fail to improve, for at any time for any worsening symptoms, Go to Emergency room/ urgent care if worse.   Marcille Buffy, FNP

## 2021-01-15 ENCOUNTER — Telehealth: Payer: Self-pay | Admitting: Adult Health

## 2021-01-15 NOTE — Telephone Encounter (Signed)
Pt called in stating that pharmacy need prior authorization for lidocaine patches and pt said he was prescribed Tramadol but it says every eight hours but pt was only prescribed 8 pills

## 2021-01-17 ENCOUNTER — Encounter (INDEPENDENT_AMBULATORY_CARE_PROVIDER_SITE_OTHER): Payer: Self-pay | Admitting: Vascular Surgery

## 2021-01-17 ENCOUNTER — Encounter: Payer: Self-pay | Admitting: Adult Health

## 2021-01-17 DIAGNOSIS — M79641 Pain in right hand: Secondary | ICD-10-CM | POA: Insufficient documentation

## 2021-01-17 DIAGNOSIS — M47812 Spondylosis without myelopathy or radiculopathy, cervical region: Secondary | ICD-10-CM | POA: Insufficient documentation

## 2021-01-17 NOTE — Assessment & Plan Note (Signed)
Followed by [facility].

## 2021-01-17 NOTE — Progress Notes (Signed)
Please call patient and verify what type of implantable device he has so we can add to chart and I can note on MRI order of lumbar spine.

## 2021-01-19 ENCOUNTER — Other Ambulatory Visit: Payer: Self-pay | Admitting: Adult Health

## 2021-01-19 NOTE — Telephone Encounter (Signed)
Tramadol refill should not be needed yet it was just prescribed at last visit?Just to be used PRN pain .  Ok to send IXL.  Will need to process lidocaine prior authorization when we receive.

## 2021-01-19 NOTE — Telephone Encounter (Signed)
Pt need a refill on tramadol and jardiance sent to [facility] [address]. Pt is requesting a back brace and prior authorization of licodane patches. Pt stated pharmacy said doctor has to do it with insurance company

## 2021-01-20 ENCOUNTER — Other Ambulatory Visit: Payer: Self-pay | Admitting: Adult Health

## 2021-01-20 ENCOUNTER — Other Ambulatory Visit: Payer: Self-pay

## 2021-01-20 DIAGNOSIS — Z9682 Presence of neurostimulator: Secondary | ICD-10-CM

## 2021-01-20 DIAGNOSIS — R9389 Abnormal findings on diagnostic imaging of other specified body structures: Secondary | ICD-10-CM

## 2021-01-20 DIAGNOSIS — Z981 Arthrodesis status: Secondary | ICD-10-CM

## 2021-01-20 DIAGNOSIS — M4722 Other spondylosis with radiculopathy, cervical region: Secondary | ICD-10-CM

## 2021-01-20 DIAGNOSIS — G894 Chronic pain syndrome: Secondary | ICD-10-CM

## 2021-01-20 DIAGNOSIS — M79604 Pain in right leg: Secondary | ICD-10-CM

## 2021-01-20 DIAGNOSIS — M542 Cervicalgia: Secondary | ICD-10-CM

## 2021-01-20 DIAGNOSIS — M79605 Pain in left leg: Secondary | ICD-10-CM

## 2021-01-20 DIAGNOSIS — G629 Polyneuropathy, unspecified: Secondary | ICD-10-CM

## 2021-01-20 DIAGNOSIS — M62830 Muscle spasm of back: Secondary | ICD-10-CM

## 2021-01-20 DIAGNOSIS — M5136 Other intervertebral disc degeneration, lumbar region: Secondary | ICD-10-CM

## 2021-01-20 DIAGNOSIS — S22080S Wedge compression fracture of T11-T12 vertebra, sequela: Secondary | ICD-10-CM

## 2021-01-20 DIAGNOSIS — E1142 Type 2 diabetes mellitus with diabetic polyneuropathy: Secondary | ICD-10-CM

## 2021-01-20 MED ORDER — TRAMADOL HCL 50 MG PO TABS: 50.0000 mg | ORAL_TABLET | Freq: Three times a day (TID) | ORAL | 0 refills | Status: DC | PRN

## 2021-01-20 MED ORDER — EMPAGLIFLOZIN 25 MG PO TABS: 25.0000 mg | ORAL_TABLET | Freq: Every day | ORAL | 1 refills | Status: AC

## 2021-01-20 NOTE — Telephone Encounter (Signed)
Meds ordered this encounter  Medications   traMADol (ULTRAM) 50 MG tablet    Sig: Take 1 tablet (50 mg total) by mouth every 8 (eight) hours as needed for up to 30 doses.    Dispense:  30 tablet    Refill:  0   He will need to purchase an over the counter soft back brace [facility] or [facility] store of choice they have multiple options.  Please find out what type of implanted device he has so I can order MRI.  Refilled Ultram recommend as dicussed only taking as needed and not every 8 hours if he can help it.  Ice/ heat and rest.  Has he heard from the pain management clinic yet ?

## 2021-01-20 NOTE — Progress Notes (Signed)
Orders Placed This Encounter  Procedures   MR LUMBAR SPINE WO CONTRAST    IMPRESSION: 1. No acute displaced fracture or traumatic listhesis of the lumbar spine in a patient with L4 through S5 surgical hardware. 2. Interval worsening of degenerative changes of the lumbar spine most prominent at the L2-L3 level. 3. Similar-appearing chronic T12 anterior wedge compression fracture.     Electronically Signed   By: Iven Finn M.D.   On: 01/14/2021 23:12    Order Specific Question:   What is the patient's sedation requirement?    Answer:   No Sedation    Order Specific Question:   Does the patient have a pacemaker or implanted devices?    Answer:   Yes    Order Specific Question:   Manufacturer of pacemake or implanted device?    Answer:   interstim    Order Specific Question:   Preferred imaging location?    Answer:   [facility] (table limit - 550lbs)   MR Thoracic Spine Wo Contrast    IMPRESSION: 1. No acute displaced fracture or traumatic listhesis of the lumbar spine in a patient with L4 through S5 surgical hardware. 2. Interval worsening of degenerative changes of the lumbar spine most prominent at the L2-L3 level. 3. Similar-appearing chronic T12 anterior wedge compression fracture.     Electronically Signed   By: Iven Finn M.D.   On: 01/14/2021 23:12    Order Specific Question:   What is the patient's sedation requirement?    Answer:   No Sedation    Order Specific Question:   Does the patient have a pacemaker or implanted devices?    Answer:   Yes    Order Specific Question:   Manufacturer of pacemake or implanted device?    Answer:   interstim    Order Specific Question:   Preferred imaging location?    Answer:   [facility] (table limit - 550lbs)   MR Cervical Spine Wo Contrast    Order Specific Question:   What is the patient's sedation requirement?    Answer:   No Sedation    Order Specific Question:   Does the patient have a pacemaker or  implanted devices?    Answer:   Yes    Order Specific Question:   Manufacturer of pacemake or implanted device?    Answer:   interstim    Order Specific Question:   Preferred imaging location?    Answer:   [facility] (table limit - 550lbs)

## 2021-01-20 NOTE — Telephone Encounter (Signed)
Spoke with pt. Pt states he received all 15 pills from the pharmacy has been taking them as needed every 8 hours and is out. Pt reports hes in a lot of pain so he has been taking them every 8 hours. Pt asking for something to help support his back and will contact pharmacy regarding PA

## 2021-01-20 NOTE — Progress Notes (Signed)
Meds ordered this encounter  Medications   traMADol (ULTRAM) 50 MG tablet    Sig: Take 1 tablet (50 mg total) by mouth every 8 (eight) hours as needed for up to 30 doses.    Dispense:  30 tablet    Refill:  0

## 2021-01-25 ENCOUNTER — Telehealth (INDEPENDENT_AMBULATORY_CARE_PROVIDER_SITE_OTHER): Payer: Self-pay | Admitting: Vascular Surgery

## 2021-01-25 NOTE — Telephone Encounter (Signed)
Pt. Called in stating he wants you to call Dr. Lake Bells so he can be seen at [facility]. He states you have spoken to Dr. Lake Bells about him before because is has been banned for [facility].  Erik Martinez can be reached at [phone].

## 2021-01-25 NOTE — Telephone Encounter (Signed)
I called and left a VM for the pt to call back an give more information as to why he want the referral to Dr. Lake Bells an who is he referring to when he says that they spoke with Dr. Paralee Cancel office previously.

## 2021-01-25 NOTE — Telephone Encounter (Signed)
I spoke with the provider Dr. Delana Meyer and made him aware of the pt's issue. He said that he has spoken with Dr. Marry Guan at [facility] and [place] at neurosurgery and they are going to send the pt to [facility]. The pt said he didn't wasn't to go to [facility] but voiced understanding.

## 2021-01-25 NOTE — Telephone Encounter (Signed)
Pt is supposed to have knee replacement  at [facility] but they wont accepting referral for neurosurgery due to past history with his mom. He would like to know could Dr. Delana Meyer put in a word with Dr. Marry Guan before he attends the board meeting tomorrow.

## 2021-01-27 DIAGNOSIS — N529 Male erectile dysfunction, unspecified: Secondary | ICD-10-CM | POA: Diagnosis not present

## 2021-01-27 DIAGNOSIS — K429 Umbilical hernia without obstruction or gangrene: Secondary | ICD-10-CM | POA: Diagnosis not present

## 2021-01-27 DIAGNOSIS — E291 Testicular hypofunction: Secondary | ICD-10-CM | POA: Diagnosis not present

## 2021-01-27 DIAGNOSIS — E1165 Type 2 diabetes mellitus with hyperglycemia: Secondary | ICD-10-CM | POA: Diagnosis not present

## 2021-01-27 DIAGNOSIS — E6609 Other obesity due to excess calories: Secondary | ICD-10-CM | POA: Diagnosis not present

## 2021-01-27 DIAGNOSIS — E7800 Pure hypercholesterolemia, unspecified: Secondary | ICD-10-CM | POA: Diagnosis not present

## 2021-01-27 NOTE — Telephone Encounter (Signed)
Erik Martinez  Please let him know referral has been sent to pain clinic he requested the restoration clinic with Brandy Hale, he can call that office and check on this at [phone]. For his ganglion cyst he will need to be seen by his orthopedics - I believe he is seeing Dr.Bowers or [facility] ?  He should call them.   The prior authorization is still pending on the lidocaine patches, I will say that [employer] sales over the counter pain relieving  lidocaine  flex patches they are 4 % and the ones I prescribed are 5 % this may need to be an over the counter purchase as insurance may not improve.   Let us know if any issues with referrals when he calls. He is already established with orthopedics and cannot be seen at [facility] due to balance per patient.

## 2021-01-27 NOTE — Telephone Encounter (Signed)
Pt called in regards to PA for lidocaine patches. Pt states the insurance company needs to be called to get this set up.    Pt also called in regards to his referral to the pain clinic and for the cyst on his wrist. Pt states he has not heard anything from anyone regarding this.

## 2021-01-29 ENCOUNTER — Ambulatory Visit: Payer: PPO

## 2021-01-29 ENCOUNTER — Ambulatory Visit: Admission: RE | Admit: 2021-01-29 | Payer: PPO | Source: Ambulatory Visit

## 2021-02-01 ENCOUNTER — Other Ambulatory Visit: Payer: Self-pay | Admitting: Adult Health

## 2021-02-01 ENCOUNTER — Telehealth: Payer: Self-pay | Admitting: Adult Health

## 2021-02-01 DIAGNOSIS — G894 Chronic pain syndrome: Secondary | ICD-10-CM

## 2021-02-01 DIAGNOSIS — M62830 Muscle spasm of back: Secondary | ICD-10-CM

## 2021-02-01 MED ORDER — TRAMADOL HCL 50 MG PO TABS: 50.0000 mg | ORAL_TABLET | Freq: Three times a day (TID) | ORAL | 0 refills | Status: AC | PRN

## 2021-02-01 MED ORDER — TRAMADOL HCL ER 100 MG PO TB24: 100.0000 mg | ORAL_TABLET | Freq: Every day | ORAL | 0 refills | Status: DC | PRN

## 2021-02-01 NOTE — Telephone Encounter (Signed)
Patient aware.

## 2021-02-01 NOTE — Telephone Encounter (Signed)
Left message to return call to office patient needs to know medication changed to extended release tramadol 100 mg 24 hour tablet , keep appointment with pain management they will manage future pain medication. Any worsening symptoms needs to call office.

## 2021-02-01 NOTE — Progress Notes (Signed)
No orders of the defined types were placed in this encounter.   Meds ordered this encounter  Medications   traMADol (ULTRAM-ER) 100 MG 24 hr tablet    Sig: Take 1 tablet (100 mg total) by mouth daily as needed for pain.    Dispense:  30 tablet    Refill:  0     Medications Discontinued During This Encounter  Medication Reason   traMADol (ULTRAM) 50 MG tablet Completed Course   Will try extended release as above, until he sees pain management and they will take over chronic pain management.

## 2021-02-01 NOTE — Telephone Encounter (Signed)
Meds ordered this encounter  Medications   traMADol (ULTRAM-ER) 100 MG 24 hr tablet    Sig: Take 1 tablet (100 mg total) by mouth daily as needed for pain.    Dispense:  30 tablet    Refill:  0  Changed to extended release to see if he has better pain control. Discontinued tramadol 50mg  BID regular release PRN chronic pain. Once he is in with pain management as referred pain management will take over medication for pain is expectation. Take only as directed and PRN.  Follow treatment plan from office  if not improving or any worsening within 72 hours and also return to office or open medical facility at ANYTIME if any symptoms persist, change, or worsen or you have any further concerns or questions. Call 911 immediately for emergencies.

## 2021-02-01 NOTE — Telephone Encounter (Signed)
Patient says the extended release cost to much just to send the regular tramadol he was on please .

## 2021-02-01 NOTE — Telephone Encounter (Signed)
Patient returned office phone call. Unable to find [facility]. Note was read to patient and patient understood.

## 2021-02-01 NOTE — Telephone Encounter (Signed)
Meds ordered this encounter  Medications   traMADol (ULTRAM) 50 MG tablet    Sig: Take 1 tablet (50 mg total) by mouth every 8 (eight) hours as needed.    Dispense:  90 tablet    Refill:  0  Sent as above.   Discontinued XR due to cost.

## 2021-02-01 NOTE — Telephone Encounter (Signed)
Pt states he needs a refill for traMADol (ULTRAM) 50 MG tablet   Pt wanted to ask for a higher mg? Please advise and Thank you!  Call pt @ [phone].

## 2021-02-01 NOTE — Progress Notes (Signed)
Meds ordered this encounter  Medications   traMADol (ULTRAM) 50 MG tablet    Sig: Take 1 tablet (50 mg total) by mouth every 8 (eight) hours as needed.    Dispense:  90 tablet    Refill:  0    XR cost too much per patient discontinued.  Medications Discontinued During This Encounter  Medication Reason   traMADol (ULTRAM-ER) 100 MG 24 hr tablet Completed Course

## 2021-02-01 NOTE — Telephone Encounter (Signed)
Patient called in  advise that he wanted  medicine  to be increased  not  to be on  the extended relief, Because of work  need to go back to the original medication patient  had before  which is   traMADol   50MG  ,   Patient  must be focus at work and patient also advise price is too high

## 2021-02-02 ENCOUNTER — Other Ambulatory Visit: Payer: Self-pay | Admitting: Adult Health

## 2021-02-02 DIAGNOSIS — F40240 Claustrophobia: Secondary | ICD-10-CM | POA: Insufficient documentation

## 2021-02-02 MED ORDER — ALPRAZOLAM 0.5 MG PO TABS: 0.5000 mg | ORAL_TABLET | Freq: Once | ORAL | 0 refills | Status: AC

## 2021-02-02 NOTE — Progress Notes (Signed)
No orders of the defined types were placed in this encounter.   Meds ordered this encounter  Medications   ALPRAZolam (XANAX) 0.5 MG tablet    Sig: Take 1 tablet (0.5 mg total) by mouth once for 1 dose. Take 15 - 30 minutes prior to appointment time.    Dispense:  2 tablet    Refill:  0

## 2021-02-02 NOTE — Telephone Encounter (Signed)
Patient notified

## 2021-02-02 NOTE — Telephone Encounter (Signed)
Meds ordered this encounter  Medications   ALPRAZolam (XANAX) 0.5 MG tablet    Sig: Take 1 tablet (0.5 mg total) by mouth once for 1 dose. Take 15 - 30 minutes prior to appointment time.    Dispense:  2 tablet    Refill:  0  Please let patient know I sent in the above, to take as directed, do not self drive.

## 2021-02-02 NOTE — Telephone Encounter (Signed)
Pt is going to have 3 MRI's on 02/09/2021 pt states he needs something to calm him done (medication). Please advise and Thank you!  Pharmacy is  [facility] [place] [facility] [address] Phone:  [phone]  Fax:  [phone]     Call pt at [phone].

## 2021-02-05 ENCOUNTER — Telehealth: Payer: Self-pay | Admitting: Adult Health

## 2021-02-05 NOTE — Telephone Encounter (Signed)
Pt called in stating that NP [name] was suppose to refer him to general surgeon to get a cyst/abscess that's on his wrist/hand. Pt stated that he never received a call from anyone. Pt requesting callback

## 2021-02-05 NOTE — Telephone Encounter (Signed)
Pt called in stating that he has a referral at pain clinic and Dr. Maryann Alar is no longer there so he wants a new referral. Pt does not want anyone from [place]; he wants someone from [facility]. Pt also does not want his medication to run out.

## 2021-02-08 ENCOUNTER — Other Ambulatory Visit: Payer: Self-pay | Admitting: Adult Health

## 2021-02-08 DIAGNOSIS — Z981 Arthrodesis status: Secondary | ICD-10-CM

## 2021-02-08 DIAGNOSIS — G894 Chronic pain syndrome: Secondary | ICD-10-CM

## 2021-02-08 DIAGNOSIS — M62830 Muscle spasm of back: Secondary | ICD-10-CM

## 2021-02-08 NOTE — Progress Notes (Signed)
Orders Placed This Encounter  Procedures   Ambulatory referral to Pain Clinic    Referral Priority:   Routine    Referral Type:   Consultation    Referral Reason:   Specialty Services Required    Requested Specialty:   Pain Medicine    Number of Visits Requested:   1

## 2021-02-09 ENCOUNTER — Ambulatory Visit
Admission: RE | Admit: 2021-02-09 | Discharge: 2021-02-09 | Disposition: A | Discharge status: Home / Self Care | Payer: PPO | Source: Ambulatory Visit | Attending: Adult Health | Admitting: Adult Health

## 2021-02-09 ENCOUNTER — Other Ambulatory Visit: Payer: Self-pay

## 2021-02-09 ENCOUNTER — Telehealth: Payer: Self-pay | Admitting: Adult Health

## 2021-02-09 ENCOUNTER — Other Ambulatory Visit: Payer: Self-pay | Admitting: Adult Health

## 2021-02-09 DIAGNOSIS — Z9682 Presence of neurostimulator: Secondary | ICD-10-CM | POA: Diagnosis not present

## 2021-02-09 DIAGNOSIS — R9389 Abnormal findings on diagnostic imaging of other specified body structures: Secondary | ICD-10-CM | POA: Diagnosis not present

## 2021-02-09 DIAGNOSIS — K429 Umbilical hernia without obstruction or gangrene: Secondary | ICD-10-CM | POA: Diagnosis not present

## 2021-02-09 DIAGNOSIS — M79605 Pain in left leg: Secondary | ICD-10-CM | POA: Diagnosis not present

## 2021-02-09 DIAGNOSIS — M5136 Other intervertebral disc degeneration, lumbar region: Secondary | ICD-10-CM | POA: Insufficient documentation

## 2021-02-09 DIAGNOSIS — N529 Male erectile dysfunction, unspecified: Secondary | ICD-10-CM | POA: Diagnosis not present

## 2021-02-09 DIAGNOSIS — M47814 Spondylosis without myelopathy or radiculopathy, thoracic region: Secondary | ICD-10-CM | POA: Diagnosis not present

## 2021-02-09 DIAGNOSIS — M5116 Intervertebral disc disorders with radiculopathy, lumbar region: Secondary | ICD-10-CM | POA: Diagnosis not present

## 2021-02-09 DIAGNOSIS — S22000A Wedge compression fracture of unspecified thoracic vertebra, initial encounter for closed fracture: Secondary | ICD-10-CM | POA: Diagnosis not present

## 2021-02-09 DIAGNOSIS — G894 Chronic pain syndrome: Secondary | ICD-10-CM | POA: Insufficient documentation

## 2021-02-09 DIAGNOSIS — E291 Testicular hypofunction: Secondary | ICD-10-CM | POA: Diagnosis not present

## 2021-02-09 DIAGNOSIS — S22080S Wedge compression fracture of T11-T12 vertebra, sequela: Secondary | ICD-10-CM | POA: Insufficient documentation

## 2021-02-09 DIAGNOSIS — G629 Polyneuropathy, unspecified: Secondary | ICD-10-CM | POA: Diagnosis not present

## 2021-02-09 DIAGNOSIS — M62830 Muscle spasm of back: Secondary | ICD-10-CM | POA: Insufficient documentation

## 2021-02-09 DIAGNOSIS — E1165 Type 2 diabetes mellitus with hyperglycemia: Secondary | ICD-10-CM | POA: Diagnosis not present

## 2021-02-09 DIAGNOSIS — M79604 Pain in right leg: Secondary | ICD-10-CM | POA: Diagnosis not present

## 2021-02-09 DIAGNOSIS — Z981 Arthrodesis status: Secondary | ICD-10-CM | POA: Insufficient documentation

## 2021-02-09 DIAGNOSIS — E7800 Pure hypercholesterolemia, unspecified: Secondary | ICD-10-CM | POA: Diagnosis not present

## 2021-02-09 DIAGNOSIS — E6609 Other obesity due to excess calories: Secondary | ICD-10-CM | POA: Diagnosis not present

## 2021-02-09 DIAGNOSIS — S22089A Unspecified fracture of T11-T12 vertebra, initial encounter for closed fracture: Secondary | ICD-10-CM | POA: Diagnosis not present

## 2021-02-09 DIAGNOSIS — G4700 Insomnia, unspecified: Secondary | ICD-10-CM

## 2021-02-09 DIAGNOSIS — M542 Cervicalgia: Secondary | ICD-10-CM | POA: Diagnosis not present

## 2021-02-09 DIAGNOSIS — M5124 Other intervertebral disc displacement, thoracic region: Secondary | ICD-10-CM | POA: Diagnosis not present

## 2021-02-09 MED ORDER — ZOLPIDEM TARTRATE 10 MG PO TABS: 10.0000 mg | ORAL_TABLET | Freq: Every evening | ORAL | 0 refills | Status: DC | PRN

## 2021-02-09 NOTE — Progress Notes (Signed)
Meds ordered this encounter  Medications   zolpidem (AMBIEN) 10 MG tablet    Sig: Take 1 tablet (10 mg total) by mouth at bedtime as needed for sleep.    Dispense:  30 tablet    Refill:  0   Insomnia, unspecified type - Plan: zolpidem (AMBIEN) 10 MG tablet

## 2021-02-09 NOTE — Telephone Encounter (Signed)
Pt need refill on zolpidem (AMBIEN) 10 MG tablet sent to [facility] [address] [place]

## 2021-02-10 NOTE — Progress Notes (Signed)
Mild T12 end plate fracture appears chronic per radiologist.  Mild compression fractures at T4, T5, T12 .  Mild thoracic degenerative changes with small disc protrusions.  Neurosurgery consult advised let me know and if location or preference

## 2021-02-10 NOTE — Progress Notes (Signed)
Moderate spinal stenosis L2- L3 with moderate hardening or stenosis bilaterally. Also had moderate to sever spinal stenosis at L3-L4 with moderate stenosis left greater than right side.  Screw noted from previous fusion L4-L5 without stenosis. Incidental finding of renal cyst, we can follow and discuss at next office viist.  Would he like to see neurosurgery for a consult ?

## 2021-02-10 NOTE — Progress Notes (Signed)
Negative for cervical fracture  Moderate spinal stenosis and moderate to severe foraminal encroachment bilaterally C5-6 Spinal  stenosis / hardening and noted cord deformity at C5-C6 Mild to moderate spinal stenosis C6-7 with moderate changes.   I highly recommend a consult with neurosurgery will he go ?

## 2021-02-24 DIAGNOSIS — K429 Umbilical hernia without obstruction or gangrene: Secondary | ICD-10-CM | POA: Diagnosis not present

## 2021-02-24 DIAGNOSIS — E291 Testicular hypofunction: Secondary | ICD-10-CM | POA: Diagnosis not present

## 2021-02-24 DIAGNOSIS — N529 Male erectile dysfunction, unspecified: Secondary | ICD-10-CM | POA: Diagnosis not present

## 2021-02-24 DIAGNOSIS — E6609 Other obesity due to excess calories: Secondary | ICD-10-CM | POA: Diagnosis not present

## 2021-02-24 DIAGNOSIS — E1165 Type 2 diabetes mellitus with hyperglycemia: Secondary | ICD-10-CM | POA: Diagnosis not present

## 2021-02-24 DIAGNOSIS — E7800 Pure hypercholesterolemia, unspecified: Secondary | ICD-10-CM | POA: Diagnosis not present

## 2021-02-26 ENCOUNTER — Other Ambulatory Visit: Payer: Self-pay

## 2021-02-26 ENCOUNTER — Ambulatory Visit (INDEPENDENT_AMBULATORY_CARE_PROVIDER_SITE_OTHER): Payer: Self-pay

## 2021-02-26 DIAGNOSIS — B351 Tinea unguium: Secondary | ICD-10-CM

## 2021-02-26 NOTE — Progress Notes (Signed)
Patient presents today for the 1st laser treatment. Diagnosed with mycotic nail infection by Dr. Amalia Hailey.   Toenail most affected right foot 1-5.  All other systems are negative.  Nails were filed thin. Laser therapy was administered to 1-5 toenails right foot and patient tolerated the treatment well. All safety precautions were in place.    Follow up in 4 weeks for laser # 2.  Picture of nails taken today to document visual progress

## 2021-02-26 NOTE — Patient Instructions (Signed)

## 2021-03-03 ENCOUNTER — Ambulatory Visit: Admit: 2021-03-03 | Payer: PRIVATE HEALTH INSURANCE

## 2021-03-03 DIAGNOSIS — E291 Testicular hypofunction: Secondary | ICD-10-CM | POA: Diagnosis not present

## 2021-03-03 DIAGNOSIS — N529 Male erectile dysfunction, unspecified: Secondary | ICD-10-CM | POA: Diagnosis not present

## 2021-03-03 DIAGNOSIS — K429 Umbilical hernia without obstruction or gangrene: Secondary | ICD-10-CM | POA: Diagnosis not present

## 2021-03-03 DIAGNOSIS — E7800 Pure hypercholesterolemia, unspecified: Secondary | ICD-10-CM | POA: Diagnosis not present

## 2021-03-03 DIAGNOSIS — E1165 Type 2 diabetes mellitus with hyperglycemia: Secondary | ICD-10-CM | POA: Diagnosis not present

## 2021-03-03 DIAGNOSIS — E6609 Other obesity due to excess calories: Secondary | ICD-10-CM | POA: Diagnosis not present

## 2021-03-05 DIAGNOSIS — Z79891 Long term (current) use of opiate analgesic: Secondary | ICD-10-CM | POA: Diagnosis not present

## 2021-03-05 DIAGNOSIS — M25512 Pain in left shoulder: Secondary | ICD-10-CM | POA: Diagnosis not present

## 2021-03-05 DIAGNOSIS — M5412 Radiculopathy, cervical region: Secondary | ICD-10-CM | POA: Diagnosis not present

## 2021-03-05 DIAGNOSIS — G890 Central pain syndrome: Secondary | ICD-10-CM | POA: Diagnosis not present

## 2021-03-05 DIAGNOSIS — M545 Low back pain, unspecified: Secondary | ICD-10-CM | POA: Diagnosis not present

## 2021-03-05 DIAGNOSIS — Z981 Arthrodesis status: Secondary | ICD-10-CM | POA: Diagnosis not present

## 2021-03-05 DIAGNOSIS — M4802 Spinal stenosis, cervical region: Secondary | ICD-10-CM | POA: Diagnosis not present

## 2021-03-05 DIAGNOSIS — G894 Chronic pain syndrome: Secondary | ICD-10-CM | POA: Diagnosis not present

## 2021-03-05 DIAGNOSIS — M5136 Other intervertebral disc degeneration, lumbar region: Secondary | ICD-10-CM | POA: Diagnosis not present

## 2021-03-05 DIAGNOSIS — M160 Bilateral primary osteoarthritis of hip: Secondary | ICD-10-CM | POA: Diagnosis not present

## 2021-03-08 ENCOUNTER — Other Ambulatory Visit: Payer: Self-pay | Admitting: Adult Health

## 2021-03-08 DIAGNOSIS — E6609 Other obesity due to excess calories: Secondary | ICD-10-CM | POA: Diagnosis not present

## 2021-03-08 DIAGNOSIS — N529 Male erectile dysfunction, unspecified: Secondary | ICD-10-CM | POA: Diagnosis not present

## 2021-03-08 DIAGNOSIS — E291 Testicular hypofunction: Secondary | ICD-10-CM | POA: Diagnosis not present

## 2021-03-08 DIAGNOSIS — K429 Umbilical hernia without obstruction or gangrene: Secondary | ICD-10-CM | POA: Diagnosis not present

## 2021-03-08 DIAGNOSIS — E7800 Pure hypercholesterolemia, unspecified: Secondary | ICD-10-CM | POA: Diagnosis not present

## 2021-03-08 DIAGNOSIS — G4700 Insomnia, unspecified: Secondary | ICD-10-CM

## 2021-03-08 DIAGNOSIS — E1165 Type 2 diabetes mellitus with hyperglycemia: Secondary | ICD-10-CM | POA: Diagnosis not present

## 2021-03-08 NOTE — Telephone Encounter (Signed)
Pt called in requesting refill on medication (zolpidem (AMBIEN) 10 MG tablet) and (Expired - pregabalin (LYRICA) 200 MG capsule). Pt requesting callback. Pt stated that he stop calling pharmacy because he have issues with them refilling medication

## 2021-03-16 ENCOUNTER — Ambulatory Visit: Payer: Self-pay | Admitting: Pharmacist

## 2021-03-16 NOTE — Chronic Care Management (AMB) (Signed)
?  Chronic Care Management  ? ?Note ? ?03/16/2021 ?Name: ROBER SKEELS MRN: [id number] DOB: 08-03-1955 ? ? ? ?Closing pharmacy CCM case at this time.  Patient has clinic contact information for future questions or concerns.  ? ?Catie Darnelle Maffucci, PharmD, [place], CPP ?Clinical Pharmacist ?Therapist, music at [facility] [employer] ?[phone] ? ?

## 2021-03-23 ENCOUNTER — Other Ambulatory Visit: Payer: Self-pay

## 2021-03-23 ENCOUNTER — Ambulatory Visit (INDEPENDENT_AMBULATORY_CARE_PROVIDER_SITE_OTHER): Payer: Self-pay

## 2021-03-23 DIAGNOSIS — B351 Tinea unguium: Secondary | ICD-10-CM

## 2021-03-23 NOTE — Progress Notes (Signed)
Patient presents today for the 2nd laser treatment. Diagnosed with mycotic nail infection by Dr. Amalia Hailey.  ? ?Toenail most affected right foot 1-5. ? ?All other systems are negative. ? ?Nails were filed thin. Laser therapy was administered to 1-5 toenails right foot and patient tolerated the treatment well. All safety precautions were in place.  ? ? ?Follow up in 4 weeks for laser # 3. ? ?

## 2021-03-24 DIAGNOSIS — E6609 Other obesity due to excess calories: Secondary | ICD-10-CM | POA: Diagnosis not present

## 2021-03-24 DIAGNOSIS — E291 Testicular hypofunction: Secondary | ICD-10-CM | POA: Diagnosis not present

## 2021-03-24 DIAGNOSIS — K429 Umbilical hernia without obstruction or gangrene: Secondary | ICD-10-CM | POA: Diagnosis not present

## 2021-03-24 DIAGNOSIS — E1165 Type 2 diabetes mellitus with hyperglycemia: Secondary | ICD-10-CM | POA: Diagnosis not present

## 2021-03-24 DIAGNOSIS — E7800 Pure hypercholesterolemia, unspecified: Secondary | ICD-10-CM | POA: Diagnosis not present

## 2021-03-24 DIAGNOSIS — N529 Male erectile dysfunction, unspecified: Secondary | ICD-10-CM | POA: Diagnosis not present

## 2021-03-26 ENCOUNTER — Other Ambulatory Visit: Payer: PPO

## 2021-04-02 DIAGNOSIS — G894 Chronic pain syndrome: Secondary | ICD-10-CM | POA: Diagnosis not present

## 2021-04-02 DIAGNOSIS — M545 Low back pain, unspecified: Secondary | ICD-10-CM | POA: Diagnosis not present

## 2021-04-02 DIAGNOSIS — G890 Central pain syndrome: Secondary | ICD-10-CM | POA: Diagnosis not present

## 2021-04-02 DIAGNOSIS — M4802 Spinal stenosis, cervical region: Secondary | ICD-10-CM | POA: Diagnosis not present

## 2021-04-02 DIAGNOSIS — M5136 Other intervertebral disc degeneration, lumbar region: Secondary | ICD-10-CM | POA: Diagnosis not present

## 2021-04-02 DIAGNOSIS — Z79891 Long term (current) use of opiate analgesic: Secondary | ICD-10-CM | POA: Diagnosis not present

## 2021-04-02 DIAGNOSIS — Z981 Arthrodesis status: Secondary | ICD-10-CM | POA: Diagnosis not present

## 2021-04-02 DIAGNOSIS — M160 Bilateral primary osteoarthritis of hip: Secondary | ICD-10-CM | POA: Diagnosis not present

## 2021-04-02 DIAGNOSIS — M25512 Pain in left shoulder: Secondary | ICD-10-CM | POA: Diagnosis not present

## 2021-04-05 DIAGNOSIS — N529 Male erectile dysfunction, unspecified: Secondary | ICD-10-CM | POA: Diagnosis not present

## 2021-04-05 DIAGNOSIS — E1165 Type 2 diabetes mellitus with hyperglycemia: Secondary | ICD-10-CM | POA: Diagnosis not present

## 2021-04-05 DIAGNOSIS — K429 Umbilical hernia without obstruction or gangrene: Secondary | ICD-10-CM | POA: Diagnosis not present

## 2021-04-05 DIAGNOSIS — E291 Testicular hypofunction: Secondary | ICD-10-CM | POA: Diagnosis not present

## 2021-04-05 DIAGNOSIS — E6609 Other obesity due to excess calories: Secondary | ICD-10-CM | POA: Diagnosis not present

## 2021-04-05 DIAGNOSIS — E7800 Pure hypercholesterolemia, unspecified: Secondary | ICD-10-CM | POA: Diagnosis not present

## 2021-04-14 ENCOUNTER — Other Ambulatory Visit: Payer: Self-pay | Admitting: Adult Health

## 2021-04-14 DIAGNOSIS — G4700 Insomnia, unspecified: Secondary | ICD-10-CM

## 2021-04-14 NOTE — Telephone Encounter (Signed)
Pt need refill on [facility]pidem [facility] [place] [address]. Pt only has two pills left ?

## 2021-04-15 MED ORDER — ZOLPIDEM TARTRATE 10 MG PO TABS: 10.0000 mg | ORAL_TABLET | Freq: Every evening | ORAL | 1 refills | Status: DC | PRN

## 2021-04-15 NOTE — Telephone Encounter (Signed)
Patient last appointment was 01/21/2021 with NP  patient has transfer of care with [facility] office scheduled can we refill Ambien until transfer date 05/25/2021 last refill was 03/08/2021. ?

## 2021-04-15 NOTE — Telephone Encounter (Signed)
Pt is calling about refill. Pt runs out today.  ?

## 2021-04-21 ENCOUNTER — Other Ambulatory Visit (INDEPENDENT_AMBULATORY_CARE_PROVIDER_SITE_OTHER): Payer: Self-pay | Admitting: Vascular Surgery

## 2021-04-21 DIAGNOSIS — I70213 Atherosclerosis of native arteries of extremities with intermittent claudication, bilateral legs: Secondary | ICD-10-CM

## 2021-04-22 DIAGNOSIS — K429 Umbilical hernia without obstruction or gangrene: Secondary | ICD-10-CM | POA: Diagnosis not present

## 2021-04-22 DIAGNOSIS — E1165 Type 2 diabetes mellitus with hyperglycemia: Secondary | ICD-10-CM | POA: Diagnosis not present

## 2021-04-22 DIAGNOSIS — N529 Male erectile dysfunction, unspecified: Secondary | ICD-10-CM | POA: Diagnosis not present

## 2021-04-22 DIAGNOSIS — E6609 Other obesity due to excess calories: Secondary | ICD-10-CM | POA: Diagnosis not present

## 2021-04-22 DIAGNOSIS — E291 Testicular hypofunction: Secondary | ICD-10-CM | POA: Diagnosis not present

## 2021-04-22 DIAGNOSIS — E7800 Pure hypercholesterolemia, unspecified: Secondary | ICD-10-CM | POA: Diagnosis not present

## 2021-04-26 ENCOUNTER — Encounter (INDEPENDENT_AMBULATORY_CARE_PROVIDER_SITE_OTHER): Payer: PPO

## 2021-04-26 ENCOUNTER — Ambulatory Visit (INDEPENDENT_AMBULATORY_CARE_PROVIDER_SITE_OTHER): Payer: PPO | Admitting: Vascular Surgery

## 2021-04-26 NOTE — Progress Notes (Deleted)
? ? ? ? ?MRN : [id number] ? ?Erik Martinez is a 66 y.o. ([birth year]) male who presents with chief complaint of check circulation. ? ?History of Present Illness:  ? ?The patient returns to the office for followup and review status post angiogram with intervention on 11/17/2020: ?  ?Procedure ?Percutaneous transluminal angioplasty and stent placement left popliteal artery. ?  ?He is also s/p angiogram of the right lower extremity on 12/22/2020 which was diagnostic. ?  ? The patient notes improvement in the left lower extremity symptoms. No interval shortening of the patient's claudication distance or rest pain symptoms. Previous wounds have now healed.  No new ulcers or wounds have occurred since the last visit. ?  ?Today he is noting a new problem.  Both of his hands are becoming numb and painful particularly when he is asleep.  The symptoms are episodic but are occurring frequently.  He works in a [employer] and notes he is having a little more difficulty holding tools with his right hand.  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 ?  ?The patient denies amaurosis fugax or recent TIA symptoms. There are no recent neurological changes noted. ?The patient denies history of DVT, PE or superficial thrombophlebitis. ?The patient denies recent episodes of angina or shortness of breath.  ? ?No outpatient medications have been marked as taking for the 04/26/21 encounter (Appointment) with Delana Meyer, Dolores Lory, MD.  ? ? ?Past Medical History:  ?Diagnosis Date  ? Anxiety   ? Arthritis   ? Avascular necrosis of bones of both hips ([place])   ? Bipolar disorder ([place])   ? Bladder spasms   ? Chronic pain of left knee   ? Chronic pain syndrome   ? PAIN CLINIC  ? Cocaine use disorder, moderate, dependence ([place]) 09/09/2015  ? DDD (degenerative disc disease)   ? SPINE  ? Depression   ? Diabetes mellitus without complication ([place])   ? Frequency-urgency syndrome   ? GERD (gastroesophageal reflux disease)   ? Hepatitis   ? Hepatitis   ?  History of hepatitis B   ? 1983  TX'D--  NO ISSUES OR SYMPTOMS SINCE  ? Hypotestosteronemia   ? 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 Nocturia   ? PONV (postoperative nausea and vomiting)   ? "difficult to put me to sleep"  ? Spinal headache   ? Urine incontinence   ? ? ?Past Surgical History:  ?Procedure Laterality Date  ? BACK SURGERY    ? 2 rods and 4 screws artificial disc  ? INTERSTIM IMPLANT PLACEMENT  2007  ? INTERSTIM IMPLANT REVISION N/A 08/17/2012  ? Procedure: REPLACMENT OF IPG PLUS REPLACE LEAD OF INTERSTIM IMPLANT ;  Surgeon: [name], MD;  Location: [facility];  Service: Urology;  Laterality: N/A;  ? JOINT REPLACEMENT    ? Lt knee and Lt hip  ? LOWER EXTREMITY ANGIOGRAPHY Left 11/17/2020  ? Procedure: LOWER EXTREMITY ANGIOGRAPHY;  Surgeon: Katha Cabal, MD;  Location: [facility];  Service: Cardiovascular;  Laterality: Left;  ? LOWER EXTREMITY ANGIOGRAPHY Right 12/22/2020  ? Procedure: LOWER EXTREMITY ANGIOGRAPHY;  Surgeon: Katha Cabal, MD;  Location: [facility];  Service: Cardiovascular;  Laterality: Right;  ? [place] SURGERY  2005  ? L5  ? LUMBAR FUSION  X2  2006  &  2007  ? OPEN DEBRIDEMENT LEFT TOTAL KNEE (SCAR, BONEY GRAOWTH)/ REMOVAL OLD SUTURES  05-01-2007  ? PARTIAL HIP ARTHROPLASTY Left  2001  ? x2  ? SHOULDER OPEN ROTATOR CUFF REPAIR Left 2000  ? TOTAL HIP ARTHROPLASTY Left 2002  ? TOTAL KNEE ARTHROPLASTY Left   ? PARTIAL LEFT KNEE REPLACEMENT PRIOR TO THIS  ? TOTAL KNEE REVISION Left 2008  ? TRIGGER FINGER RELEASE Bilateral   ? SEVERAL FINGERS  ? TRIGGER FINGER RELEASE Right 06/18/2014  ? Procedure: RELEASE TRIGGER FINGER/A-1 PULLEY;  Surgeon: [name], MD;  Location: [facility];  Service: Orthopedics;  Laterality: Right;  ? ? ?Social History ?Social History  ? ?Tobacco Use  ? Smoking status: Never  ? Smokeless tobacco: Never  ?Vaping Use  ? Vaping Use: Never used  ?Substance Use Topics  ? Alcohol use: Not Currently  ?  Alcohol/week:  0.0 - 2.0 standard drinks  ? Drug use: Not Currently  ?  Types: Cocaine  ?  Comment: pt states its been years since he used cocaine  ? ? ?Family History ?Family History  ?Problem Relation Age of Onset  ? Diabetes Mother   ? Arthritis Mother   ? Cancer Mother   ? Hyperlipidemia Mother   ? Stroke Mother   ? Arthritis Father   ? Cancer Father   ? ? ?Allergies  ?Allergen Reactions  ? Suboxone [Buprenorphine Hcl-Naloxone Hcl] Nausea And Vomiting  ? Tape Other (See Comments)  ?  [place] ?*Paper Tape is ok   ? Codeine Rash, Itching and Nausea Only  ? Morphine And Related Itching and Rash  ? Other Rash  ?  Telemetry electrodes  ? ? ? ?REVIEW OF SYSTEMS (Negative unless checked) ? ?Constitutional: '[]'$ Weight loss  '[]'$ Fever  '[]'$ Chills ?Cardiac: '[]'$ Chest pain   '[]'$ Chest pressure   '[]'$ Palpitations   '[]'$ Shortness of breath when laying flat   '[]'$ Shortness of breath with exertion. ?Vascular:  '[x]'$ Pain in legs with walking   '[]'$ Pain in legs at rest  '[]'$ History of DVT   '[]'$ Phlebitis   '[]'$ Swelling in legs   '[]'$ Varicose veins   '[]'$ Non-healing ulcers ?Pulmonary:   '[]'$ Uses home oxygen   '[]'$ Productive cough   '[]'$ Hemoptysis   '[]'$ Wheeze  '[]'$ COPD   '[]'$ Asthma ?Neurologic:  '[]'$ Dizziness   '[]'$ Seizures   '[]'$ History of stroke   '[]'$ History of TIA  '[]'$ Aphasia   '[]'$ Vissual changes   '[]'$ Weakness or numbness in arm   '[x]'$ Weakness or numbness in leg ?Musculoskeletal:   '[]'$ Joint swelling   '[]'$ Joint pain   '[]'$ Low back pain ?Hematologic:  '[]'$ Easy bruising  '[]'$ Easy bleeding   '[]'$ Hypercoagulable state   '[]'$ Anemic ?Gastrointestinal:  '[]'$ Diarrhea   '[]'$ Vomiting  '[x]'$ Gastroesophageal reflux/heartburn   '[]'$ Difficulty swallowing. ?Genitourinary:  '[]'$ Chronic kidney disease   '[]'$ Difficult urination  '[]'$ Frequent urination   '[]'$ Blood in urine ?Skin:  '[]'$ Rashes   '[]'$ Ulcers  ?Psychological:  '[]'$ History of anxiety   '[]'$  History of major depression. ? ?Physical Examination ? ?There were no vitals filed for this visit. ?There is no height or weight on file to calculate BMI. ?Gen: WD/WN, NAD ?Head: [place]/AT, No temporalis  wasting.  ?Ear/Nose/Throat: Hearing grossly intact, nares w/o erythema or drainage ?Eyes: PER, EOMI, sclera nonicteric.  ?Neck: Supple, no masses.  No bruit or JVD.  ?Pulmonary:  Good air movement, no audible wheezing, no use of accessory muscles.  ?Cardiac: RRR, normal S1, S2, no Murmurs. ?Vascular:  mild trophic changes, no open wounds ?Vessel Right Left  ?Radial Palpable Palpable  ?PT Not Palpable Not Palpable  ?DP Not Palpable Not Palpable  ?Gastrointestinal: soft, non-distended. No guarding/no peritoneal signs.  ?Musculoskeletal: M/S 5/5 throughout.  No visible deformity.  ?Neurologic: CN 2-12  intact. Pain and light touch intact in extremities.  Symmetrical.  Speech is fluent. Motor exam as listed above. ?Psychiatric: Judgment intact, Mood & affect appropriate for pt's clinical situation. ?Dermatologic: No rashes or ulcers noted.  No changes consistent with cellulitis. ? ? ?CBC ?Lab Results  ?Component Value Date  ? WBC 7.6 11/19/2020  ? HGB 13.8 11/19/2020  ? HCT 43 11/19/2020  ? MCV 88.8 11/22/2017  ? PLT 217 11/19/2020  ? ? ?BMET ?   ?Component Value Date/Time  ? NA 138 11/19/2020 0000  ? NA 142 05/23/2013 0329  ? K 4.8 11/19/2020 0000  ? K 3.8 05/23/2013 0329  ? CL 98 (A) 11/19/2020 0000  ? CL 109 (H) 05/23/2013 0329  ? CO2 25 (A) 11/19/2020 0000  ? CO2 27 05/23/2013 0329  ? GLUCOSE 142 (H) 11/22/2017 7793  ? GLUCOSE 151 (H) 05/23/2013 0329  ? BUN 27 (H) 12/22/2020 0715  ? BUN 19 11/19/2020 0000  ? BUN 5 (L) 05/23/2013 0329  ? CREATININE 1.17 12/22/2020 0715  ? CREATININE 0.98 05/27/2013 0301  ? CALCIUM 9.6 11/19/2020 0000  ? CALCIUM 8.5 05/23/2013 0329  ? GFRNONAA >60 12/22/2020 0715  ? GFRNONAA >60 05/27/2013 0301  ? GFRAA >60 12/04/2016 0052  ? GFRAA >60 05/27/2013 0301  ? ?CrCl cannot be calculated (Patient's most recent lab result is older than the maximum 21 days allowed.). ? ?COAG ?Lab Results  ?Component Value Date  ? INR 0.95 10/17/2016  ? INR 1.04 07/15/2016  ? INR 1.0 04/30/2007   ? ? ?Radiology ?No results found. ? ? ?Assessment/Plan ?There are no diagnoses linked to this encounter. ? ? ?[name], MD ? ?04/26/2021 ?1:00 PM ? ?  ?

## 2021-04-30 ENCOUNTER — Ambulatory Visit (INDEPENDENT_AMBULATORY_CARE_PROVIDER_SITE_OTHER): Payer: Self-pay

## 2021-04-30 DIAGNOSIS — B351 Tinea unguium: Secondary | ICD-10-CM

## 2021-04-30 NOTE — Progress Notes (Signed)
Patient presents today for the 2nd laser treatment. Diagnosed with mycotic nail infection by Dr. Amalia Hailey.  ? ?Toenail most affected right foot 1-5. ? ?All other systems are negative. ? ?Nails were filed thin. Laser therapy was administered to 1-5 toenails right foot and patient tolerated the treatment well. All safety precautions were in place.  ? ? ?Follow up in 4 weeks for laser # 4. ? ?

## 2021-05-04 ENCOUNTER — Other Ambulatory Visit: Payer: Self-pay

## 2021-05-04 ENCOUNTER — Telehealth: Payer: Self-pay | Admitting: Adult Health

## 2021-05-04 DIAGNOSIS — N429 Disorder of prostate, unspecified: Secondary | ICD-10-CM | POA: Diagnosis not present

## 2021-05-04 DIAGNOSIS — Z79891 Long term (current) use of opiate analgesic: Secondary | ICD-10-CM | POA: Diagnosis not present

## 2021-05-04 DIAGNOSIS — M25512 Pain in left shoulder: Secondary | ICD-10-CM | POA: Diagnosis not present

## 2021-05-04 DIAGNOSIS — I10 Essential (primary) hypertension: Secondary | ICD-10-CM | POA: Diagnosis not present

## 2021-05-04 DIAGNOSIS — M5136 Other intervertebral disc degeneration, lumbar region: Secondary | ICD-10-CM | POA: Diagnosis not present

## 2021-05-04 DIAGNOSIS — R5383 Other fatigue: Secondary | ICD-10-CM | POA: Diagnosis not present

## 2021-05-04 DIAGNOSIS — M160 Bilateral primary osteoarthritis of hip: Secondary | ICD-10-CM | POA: Diagnosis not present

## 2021-05-04 DIAGNOSIS — E559 Vitamin D deficiency, unspecified: Secondary | ICD-10-CM | POA: Diagnosis not present

## 2021-05-04 DIAGNOSIS — G894 Chronic pain syndrome: Secondary | ICD-10-CM | POA: Diagnosis not present

## 2021-05-04 DIAGNOSIS — M545 Low back pain, unspecified: Secondary | ICD-10-CM | POA: Diagnosis not present

## 2021-05-04 DIAGNOSIS — M4802 Spinal stenosis, cervical region: Secondary | ICD-10-CM | POA: Diagnosis not present

## 2021-05-04 DIAGNOSIS — G890 Central pain syndrome: Secondary | ICD-10-CM | POA: Diagnosis not present

## 2021-05-04 DIAGNOSIS — G4700 Insomnia, unspecified: Secondary | ICD-10-CM

## 2021-05-04 NOTE — Telephone Encounter (Signed)
Patient is requesting a refill on his zolpidem (AMBIEN) 10 MG tablet. Patient has a TOC appointment at [facility] in May. ?Patient was a [facility] patient.  ?

## 2021-05-05 DIAGNOSIS — K429 Umbilical hernia without obstruction or gangrene: Secondary | ICD-10-CM | POA: Diagnosis not present

## 2021-05-05 DIAGNOSIS — E7800 Pure hypercholesterolemia, unspecified: Secondary | ICD-10-CM | POA: Diagnosis not present

## 2021-05-05 DIAGNOSIS — E291 Testicular hypofunction: Secondary | ICD-10-CM | POA: Diagnosis not present

## 2021-05-05 DIAGNOSIS — E1165 Type 2 diabetes mellitus with hyperglycemia: Secondary | ICD-10-CM | POA: Diagnosis not present

## 2021-05-05 DIAGNOSIS — N529 Male erectile dysfunction, unspecified: Secondary | ICD-10-CM | POA: Diagnosis not present

## 2021-05-05 DIAGNOSIS — E6609 Other obesity due to excess calories: Secondary | ICD-10-CM | POA: Diagnosis not present

## 2021-05-05 MED ORDER — ZOLPIDEM TARTRATE 10 MG PO TABS: 10.0000 mg | ORAL_TABLET | Freq: Every evening | ORAL | 0 refills | Status: DC | PRN

## 2021-05-06 NOTE — Telephone Encounter (Signed)
Sent by Dr Derrel Nip ?

## 2021-05-10 NOTE — Telephone Encounter (Signed)
I called and was transferred to pharmacst at [facility] on [address]. I gave verbal okay for patient to fill early. I tried to call patient to make him aware & he did not answer. I left VM letting him know that verbal was given & he could pick up when [facility] has Ambien ready.  ?

## 2021-05-10 NOTE — Telephone Encounter (Signed)
Pt called in stating that pharmacy wouldn't refill medication (zolpidem (AMBIEN) 10 MG tablet) because its not due until 05/12/2021... Pt stated that pharmacy advised him to call provider office to see if they can call pharmacy to advise pharmacy that pt can pick up medication before 05/12/2021... Pt stated that he will be leaving out of state on 05/12/2021... Pt requesting callback...  ?

## 2021-05-14 ENCOUNTER — Telehealth: Payer: Self-pay | Admitting: Adult Health

## 2021-05-14 NOTE — Telephone Encounter (Signed)
Pt called in requesting refill on medication (pregabalin (LYRICA) 200 MG capsule)... Pt stated that he just realized that he was low on medication this morning...  Pt stated that he is on vacation in [place]... Pt was wondering if medication can be sent to [facility] [address] [place] [address] [phone]...  ?

## 2021-05-14 NOTE — Telephone Encounter (Addendum)
Pt called about medication and pt stated if it cannot be sent to [place] can it be sent to [facility] so he can have it ready for him on tuesday ?

## 2021-05-16 MED ORDER — PREGABALIN 200 MG PO CAPS: 200.0000 mg | ORAL_CAPSULE | Freq: Three times a day (TID) | ORAL | 0 refills | Status: DC

## 2021-05-16 NOTE — Telephone Encounter (Signed)
Lyrica sent to pharmacy. He should not be without this medication for too long. It looks like he may already have a refill available at his local pharmacy for when he returns.  ?

## 2021-05-17 NOTE — Telephone Encounter (Signed)
Pt called back & he is aware that Lyrica was sent. ?

## 2021-05-17 NOTE — Telephone Encounter (Signed)
LMTCB

## 2021-05-24 ENCOUNTER — Telehealth: Payer: Self-pay | Admitting: Adult Health

## 2021-05-25 ENCOUNTER — Encounter: Payer: Self-pay | Admitting: Family Medicine

## 2021-05-25 ENCOUNTER — Ambulatory Visit (INDEPENDENT_AMBULATORY_CARE_PROVIDER_SITE_OTHER): Payer: PPO | Admitting: Family Medicine

## 2021-05-25 VITALS — BP 120/60 | HR 76 | Temp 97.8°F | Ht 68.75 in | Wt 203.2 lb

## 2021-05-25 DIAGNOSIS — E1169 Type 2 diabetes mellitus with other specified complication: Secondary | ICD-10-CM

## 2021-05-25 DIAGNOSIS — E785 Hyperlipidemia, unspecified: Secondary | ICD-10-CM | POA: Diagnosis not present

## 2021-05-25 DIAGNOSIS — F411 Generalized anxiety disorder: Secondary | ICD-10-CM | POA: Diagnosis not present

## 2021-05-25 DIAGNOSIS — G629 Polyneuropathy, unspecified: Secondary | ICD-10-CM | POA: Diagnosis not present

## 2021-05-25 DIAGNOSIS — E1165 Type 2 diabetes mellitus with hyperglycemia: Secondary | ICD-10-CM

## 2021-05-25 DIAGNOSIS — I739 Peripheral vascular disease, unspecified: Secondary | ICD-10-CM

## 2021-05-25 DIAGNOSIS — E1142 Type 2 diabetes mellitus with diabetic polyneuropathy: Secondary | ICD-10-CM

## 2021-05-25 DIAGNOSIS — G894 Chronic pain syndrome: Secondary | ICD-10-CM

## 2021-05-25 DIAGNOSIS — Z8619 Personal history of other infectious and parasitic diseases: Secondary | ICD-10-CM | POA: Diagnosis not present

## 2021-05-25 DIAGNOSIS — G4700 Insomnia, unspecified: Secondary | ICD-10-CM | POA: Diagnosis not present

## 2021-05-25 DIAGNOSIS — G8929 Other chronic pain: Secondary | ICD-10-CM | POA: Diagnosis not present

## 2021-05-25 DIAGNOSIS — M25562 Pain in left knee: Secondary | ICD-10-CM

## 2021-05-25 NOTE — Patient Instructions (Addendum)
We will get lab results and notes ? We will set up a visit with pharmacist to assist with medication .Marland Kitchen ozempic ? I will start a referral to psychiatry. ? ? ?

## 2021-05-25 NOTE — Assessment & Plan Note (Addendum)
Well controlled on jardiance and metfomrin 1000 mg  Once daily.  He is having some difficulty getting medication without samples.  We do not have samples at our office.  Referral placed for social worker to assist with medications.

## 2021-05-25 NOTE — Progress Notes (Signed)
Patient ID: Erik Martinez, male    DOB: 01/28/1955, 66 y.o.   MRN: [id number]  This visit was conducted in person.  BP 120/60   Pulse 76   Temp 97.8 F (36.6 C) (Oral)   Ht 5' 8.75" (1.746 m)   Wt 203 lb 3 oz (92.2 kg)   SpO2 96%   BMI 30.22 kg/m    CC:  Chief Complaint  Patient presents with   Establish Care    Subjective:   HPI: Erik Martinez is a 66 y.o. male presenting on 05/25/2021 for Establish Care   His provider has left [employer].  DM, well controlled: FBS 127.. has decreased metformin to  500 mg  daily on his own 1 months ago.On ozempic 0.5 mg for 5 months.Marland Kitchen lost 12 lbs on this. On jardiance 25 mg daily.  A1C per pt was 6.3 Lab Results  Component Value Date   HGBA1C 6.7 11/19/2020   Wt Readings from Last 3 Encounters:  05/25/21 203 lb 3 oz (92.2 kg)  01/14/21 207 lb 12.8 oz (94.3 kg)  01/14/21 208 lb 3.2 oz (94.4 kg)   Recent cholesterol check... lipids well controlled.  Was referred to ENDO Dr Francoise Schaumann for management   Has testosterone pellets place lasted week...stay in place for 6 months.  Plan recheck in 6 weeks.  Chronic insomnia: needs refill of ambein 10 qHS.  Seen at [facility] once every 4 months.  He was in prison, released.  Has PTSD post [employer] and prison.  HX of depression  well controlled now. SE to lexapro, cymbalta and prozac in past... makes him withdrawn.  He is interested in restarting alprazolam TID. Has claustrophobia, situational anxiety.   Chronic pain.Marland Kitchen goes to [facility] for pain.  He has chronic low pain ( history of spinal stimulator), hand pain,  left hip.   Hx of avascular necrosis bialterla hips.  After back surgery had nerve issue with bladder.. under control now with bladder stimulator. Next appt 06/01/2021   Followed by Dr. Marry Guan for left OA.   PAD:  stent place in left leg 4 months ago. Followed by vascular.   Neuropathy: burning in feet, inadequate control.  Lyrica  200 mg three times a  day.   Relevant past medical, surgical, family and social history reviewed and updated as indicated. Interim medical history since our last visit reviewed. Allergies and medications reviewed and updated. Outpatient Medications Prior to Visit  Medication Sig Dispense Refill   acetaminophen (TYLENOL) 500 MG tablet Take 1,500 mg by mouth 2 (two) times daily as needed for moderate pain or mild pain.     aspirin EC 81 MG tablet Take 1 tablet (81 mg total) by mouth daily. Swallow whole. 150 tablet 2   atorvastatin (LIPITOR) 20 MG tablet Take 20 mg by mouth at bedtime.     cyclobenzaprine (FLEXERIL) 10 MG tablet SMARTSIG:1 Tablet(s) By Mouth Every 12 Hours     diclofenac (VOLTAREN) 75 MG EC tablet Take 1 tablet (75 mg total) by mouth 2 (two) times daily as needed. 60 tablet 2   empagliflozin (JARDIANCE) 25 MG TABS tablet Take 1 tablet (25 mg total) by mouth daily. 90 tablet 1   ferrous sulfate 324 MG TBEC TAKE ONE TABLET BY MOUTH MONDAY,WEDNESDAY,FRIDAY     lidocaine (LIDODERM) 5 % Place 1 patch onto the skin daily. Remove & Discard patch within 12 hours or as directed by MD 30 patch 0   metFORMIN (GLUCOPHAGE) 1000 MG  tablet Take 1,000 mg by mouth 2 (two) times daily.     Multiple Vitamin (MULTIVITAMIN WITH MINERALS) TABS tablet Take 1 tablet by mouth in the morning.     Oxycodone HCl 10 MG TABS Take 10 mg by mouth every 6 (six) hours.     OZEMPIC, 0.25 OR 0.5 MG/DOSE, 2 MG/3ML SOPN Inject 0.5 mg into the skin once a week.     pantoprazole (PROTONIX) 40 MG tablet Take 1 tablet (40 mg total) by mouth daily. 90 tablet 3   pregabalin (LYRICA) 200 MG capsule Take 1 capsule (200 mg total) by mouth 3 (three) times daily. 90 capsule 0   sildenafil (VIAGRA) 100 MG tablet TAKE ONE-HALF TABLET BY MOUTH AS DIRECTED 1 HOUR BEFORE SEXUAL ACTIVITY. DO NOT TAKE WITHIN 6 HOURS OF TERAZOSIN, PRAZOSIN, OR DOXAZOSIN. DO NOT TAKE MORE THAN 1 DOSE WITHIN 24 HOURS . DO NOT TAKE WITH NITRATES     TESTOSTERONE CYPIONATE IM  Inject 1 Dose into the muscle every 14 (fourteen) days.     zolpidem (AMBIEN) 10 MG tablet Take 1 tablet (10 mg total) by mouth at bedtime as needed. for sleep 30 tablet 0   ciclopirox (PENLAC) 8 % solution Apply topically at bedtime. Apply over nail and surrounding skin. Apply daily over previous coat. After seven (7) days, may remove with alcohol and continue cycle. (Patient not taking: Reported on 01/06/2021) 6.6 mL 0   clopidogrel (PLAVIX) 75 MG tablet Take 1 tablet (75 mg total) by mouth daily. 30 tablet 11   tadalafil (CIALIS) 20 MG tablet Take 20 mg by mouth daily as needed for erectile dysfunction.     No facility-administered medications prior to visit.     Per HPI unless specifically indicated in ROS section below Review of Systems  Constitutional:  Negative for fatigue and fever.  HENT:  Negative for ear pain.   Eyes:  Negative for pain.  Respiratory:  Negative for cough and shortness of breath.   Cardiovascular:  Negative for chest pain, palpitations and leg swelling.  Gastrointestinal:  Negative for abdominal pain.  Genitourinary:  Negative for dysuria.  Musculoskeletal:  Positive for back pain. Negative for arthralgias.  Neurological:  Negative for syncope, light-headedness and headaches.  Psychiatric/Behavioral:  Negative for dysphoric mood.    Objective:  BP 120/60   Pulse 76   Temp 97.8 F (36.6 C) (Oral)   Ht 5' 8.75" (1.746 m)   Wt 203 lb 3 oz (92.2 kg)   SpO2 96%   BMI 30.22 kg/m   Wt Readings from Last 3 Encounters:  05/25/21 203 lb 3 oz (92.2 kg)  01/14/21 207 lb 12.8 oz (94.3 kg)  01/14/21 208 lb 3.2 oz (94.4 kg)      Physical Exam Constitutional:      Appearance: He is well-developed.  HENT:     Head: Normocephalic.     Right Ear: Hearing normal.     Left Ear: Hearing normal.     Nose: Nose normal.  Neck:     Thyroid: No thyroid mass or thyromegaly.     Vascular: No carotid bruit.     Trachea: Trachea normal.  Cardiovascular:     Rate and  Rhythm: Normal rate and regular rhythm.     Pulses: Normal pulses.     Heart sounds: Heart sounds not distant. No murmur heard.    No friction rub. No gallop.     Comments: No peripheral edema Pulmonary:     Effort: Pulmonary effort is  normal. No respiratory distress.     Breath sounds: Normal breath sounds.  Skin:    General: Skin is warm and dry.     Findings: No rash.  Psychiatric:        Speech: Speech normal.        Behavior: Behavior normal.        Thought Content: Thought content normal.       Results for orders placed or performed in visit on 01/01/21  CBC and differential  Result Value Ref Range   Hemoglobin 13.8 13.5 - 17.5   HCT 43 41 - 53   Neutrophils Absolute 5.10    Platelets 217 150 - 399   WBC 7.6   CBC  Result Value Ref Range   RBC 5.72 (A) 3.87 - 9.62  Basic metabolic panel  Result Value Ref Range   Creatinine 1.2 0.6 - 1.3  Comprehensive metabolic panel  Result Value Ref Range   GFR calc non Af Amer 71   Basic metabolic panel  Result Value Ref Range   BUN 19 4 - 21  Basic metabolic panel  Result Value Ref Range   CO2 25 (A) 13 - 22   Potassium 4.8 3.4 - 5.3   Sodium 138 137 - 147   Chloride 98 (A) 99 - 108  Comprehensive metabolic panel  Result Value Ref Range   Globulin 2.8    Calcium 9.6 8.7 - 10.7   Albumin 4.8 3.5 - 5.0  Hepatic function panel  Result Value Ref Range   Alkaline Phosphatase 55 25 - 125   ALT 20 10 - 40   AST 26 14 - 40   Bilirubin, Total 0.3   Vitamin B12  Result Value Ref Range   Vitamin B-12 383   Hemoglobin A1c  Result Value Ref Range   Hemoglobin A1C 6.7   Testosterone  Result Value Ref Range   Testosterone 191      COVID 19 screen:  No recent travel or known exposure to COVID19 The patient denies respiratory symptoms of COVID 19 at this time. The importance of social distancing was discussed today.   Assessment and Plan    Problem List Items Addressed This Visit     GAD (generalized anxiety  disorder) (Chronic)    Chronic, inadequate control.  Referral placed for psychiatry.      Relevant Orders   Ambulatory referral to Psychiatry   PAD (peripheral artery disease) (HCC) (Chronic)   Relevant Medications   sildenafil (VIAGRA) 100 MG tablet   Type 2 diabetes mellitus with diabetic polyneuropathy, without long-term current use of insulin (HCC) (Chronic)     Well controlled on jardiance and metfomrin 1000 mg  Once daily.  He is having some difficulty getting medication without samples.  We do not have samples at our office.  Referral placed for social worker to assist with medications.      Relevant Medications   cyclobenzaprine (FLEXERIL) 10 MG tablet   OZEMPIC, 0.25 OR 0.5 MG/DOSE, 2 MG/3ML SOPN   Chronic insomnia   Chronic pain of left knee    Chronic, moderate control  Followed by Dr. Marry Guan.      Relevant Medications   cyclobenzaprine (FLEXERIL) 10 MG tablet   Oxycodone HCl 10 MG TABS   Chronic pain syndrome    goes to [facility] for pain.  He has chronic low pain ( history of spinal stimulator), hand pain,  left hip.   Hx of avascular necrosis bialterla hips.  After back surgery had nerve issue with bladder.. under control now with bladder stimulator. Next appt 06/01/2021      Relevant Medications   cyclobenzaprine (FLEXERIL) 10 MG tablet   Oxycodone HCl 10 MG TABS   History of hepatitis C    Treated with Maverick for 6 months, now in remission.   Followed by [facility] liver.      Hyperlipidemia associated with type 2 diabetes mellitus (HCC)    Chronic, recent check showed normal lipid panel.      Relevant Medications   sildenafil (VIAGRA) 100 MG tablet   OZEMPIC, 0.25 OR 0.5 MG/DOSE, 2 MG/3ML SOPN   Neuropathy    Chronic, moderate control  Continue Lyrica 200 mg 3 times daily      Other Visit Diagnoses     Type 2 diabetes mellitus with hyperglycemia, without long-term current use of insulin (HCC)    -  Primary   Relevant Medications    OZEMPIC, 0.25 OR 0.5 MG/DOSE, 2 MG/3ML SOPN   Other Relevant Orders   AMB Referral to [facility]      Orders Placed This Encounter  Procedures   Ambulatory referral to Psychiatry    Referral Priority:   Routine    Referral Type:   Psychiatric    Referral Reason:   Specialty Services Required    Requested Specialty:   Psychiatry    Number of Visits Requested:   1   AMB Referral to [facility] Coordinaton    Referral Priority:   Routine    Referral Type:   Consultation    Referral Reason:   Care Coordination    Number of Visits Requested:   1     Eliezer Lofts, MD

## 2021-05-25 NOTE — Assessment & Plan Note (Signed)
Treated with Maverick for 6 months, now in remission.  ? Followed by [facility] liver. ?

## 2021-05-27 ENCOUNTER — Telehealth: Payer: Self-pay | Admitting: Family Medicine

## 2021-05-27 NOTE — Chronic Care Management (AMB) (Signed)
  Chronic Care Management   Note  05/27/2021 Name: Erik Martinez MRN: [id number] DOB: [birth year]  Erik Martinez is a 66 y.o. year old male who is a primary care patient of Bedsole, Amy E, MD. I reached out to [name] by phone today in response to a referral sent by [name]'s PCP, Jinny Sanders, MD.   Mr. Mitro was given information about Chronic Care Management services today including:  CCM service includes personalized support from designated clinical staff supervised by his physician, including individualized plan of care and coordination with other care providers 24/7 contact phone numbers for assistance for urgent and routine care needs. Service will only be billed when office clinical staff spend 20 minutes or more in a month to coordinate care. Only one practitioner may furnish and bill the service in a calendar month. The patient may stop CCM services at any time (effective at the end of the month) by phone call to the office staff.   Patient agreed to services and verbal consent obtained.   Follow up plan:REFERRAL/ NO COPAY   Tatjana Dellinger Upstream Scheduler

## 2021-05-28 ENCOUNTER — Ambulatory Visit (INDEPENDENT_AMBULATORY_CARE_PROVIDER_SITE_OTHER): Payer: Self-pay

## 2021-05-28 ENCOUNTER — Telehealth: Payer: Self-pay

## 2021-05-28 DIAGNOSIS — G4700 Insomnia, unspecified: Secondary | ICD-10-CM

## 2021-05-28 DIAGNOSIS — B351 Tinea unguium: Secondary | ICD-10-CM

## 2021-05-28 MED ORDER — ZOLPIDEM TARTRATE 10 MG PO TABS: 10.0000 mg | ORAL_TABLET | Freq: Every evening | ORAL | 0 refills | Status: DC | PRN

## 2021-05-28 NOTE — Addendum Note (Signed)
Addended byEliezer Lofts E on: 05/28/2021 02:52 PM   Modules accepted: Orders

## 2021-05-28 NOTE — Progress Notes (Signed)
Chronic Care Management Pharmacy Assistant   Name: Erik Martinez  MRN: [id number] DOB: 1955-06-10  Reason for Encounter: CCM (Appointment Reminder)   Recent office visits:  05/25/21 Eliezer Lofts, MD DM: Refereal to psychiatry. Stop (not taking) Ciclopirox 8%. Stop (completed): Clopidogrel Bisulfate 75 mg. F/U 3 weeks.   Recent consult visits:  05/28/21 Daylene Katayama, DPM (Podiatry): laser treatment of thick discolored toenails.  04/30/21 Daylene Katayama, DPM (Podiatry): laser treatment of thick discolored toenails.  04/05/21 Shamil Morayati (Endo): HCL No other info.  03/24/21 Shamil Morayati (Endo): HCL No other info.  03/23/21 Daylene Katayama, DPM (Podiatry): laser treatment of thick discolored toenails.  03/08/21 Shamil Morayati (Endo): HCL No other info.  03/05/21 Jens Som, PA (Orthopedic Surgery): DDD Start: Robaxin 03/03/21 Shamil Morayati (Endo): HCL No other info.  02/26/21 Daylene Katayama, DPM (Podiatry): laser treatment of thick discolored toenails.  02/24/21 Shamil Morayati (Endo): HCL No other info.  02/09/21 MR Cervical Spine, theoracic and lumbar spine 01/27/21 Shamil Morayati (Endo): HCL No other info.  01/521 [name], MD (Vascular Surgery): Atherosclerosis of native artery. No med changes. Referral to Neurosurgery.  05/10/21 Laverna Peace, NP (Family Medicine): Back muscle spasms. Ordered: DG Hand left and right, hip and lumbar spine. Start the following: Lidocaine 5% patch, Tizanidine 4 mg and Tramadol 50 mg. Stop (completed) Baclofen 20 mg.  01/13/21 Shamil Morayati (Endo): HCL No other info.  12/30/20 Shamil Morayati (Endo): HCL No other info.  12/25/20 Daylene Katayama, DPM (Podiatry): HAV Return for laser treatment of thick discolored toenails.  12/22/20 Lower extremity angiography.  12/15/20 Laverna Peace, NP (Family Medicine): Back muscle spasms. Referral to general surgery and GI. Start: Baclofen 10 mg 12/14/20 [name], MD (Vascular Surgery):  Atherosclerosis of native artery. No med changes.  12/01/20 Shamil Morayati (Endo): HCL No other info.   Hospital visits:  None in previous 6 months  Medications: Outpatient Encounter Medications as of 05/28/2021  Medication Sig Note   acetaminophen (TYLENOL) 500 MG tablet Take 1,500 mg by mouth 2 (two) times daily as needed for moderate pain or mild pain.    aspirin EC 81 MG tablet Take 1 tablet (81 mg total) by mouth daily. Swallow whole.    atorvastatin (LIPITOR) 20 MG tablet Take 20 mg by mouth at bedtime.    cyclobenzaprine (FLEXERIL) 10 MG tablet SMARTSIG:1 Tablet(s) By Mouth Every 12 Hours    diclofenac (VOLTAREN) 75 MG EC tablet Take 1 tablet (75 mg total) by mouth 2 (two) times daily as needed.    empagliflozin (JARDIANCE) 25 MG TABS tablet Take 1 tablet (25 mg total) by mouth daily.    ferrous sulfate 324 MG TBEC TAKE ONE TABLET BY MOUTH MONDAY,WEDNESDAY,FRIDAY    lidocaine (LIDODERM) 5 % Place 1 patch onto the skin daily. Remove & Discard patch within 12 hours or as directed by MD    metFORMIN (GLUCOPHAGE) 1000 MG tablet Take 1,000 mg by mouth 2 (two) times daily.    Multiple Vitamin (MULTIVITAMIN WITH MINERALS) TABS tablet Take 1 tablet by mouth in the morning.    Oxycodone HCl 10 MG TABS Take 10 mg by mouth every 6 (six) hours.    OZEMPIC, 0.25 OR 0.5 MG/DOSE, 2 MG/3ML SOPN Inject 0.5 mg into the skin once a week.    pantoprazole (PROTONIX) 40 MG tablet Take 1 tablet (40 mg total) by mouth daily.    pregabalin (LYRICA) 200 MG capsule Take 1 capsule (200 mg total) by mouth 3 (three) times daily.  sildenafil (VIAGRA) 100 MG tablet TAKE ONE-HALF TABLET BY MOUTH AS DIRECTED 1 HOUR BEFORE SEXUAL ACTIVITY. DO NOT TAKE WITHIN 6 HOURS OF TERAZOSIN, PRAZOSIN, OR DOXAZOSIN. DO NOT TAKE MORE THAN 1 DOSE WITHIN 24 HOURS . DO NOT TAKE WITH NITRATES    TESTOSTERONE CYPIONATE IM Inject 1 Dose into the muscle every 14 (fourteen) days. 01/06/2021: Receives from Dr. Ronnald Collum Q14 days   zolpidem  (AMBIEN) 10 MG tablet Take 1 tablet (10 mg total) by mouth at bedtime as needed. for sleep    No facility-administered encounter medications on file as of 05/28/2021.   Erik Martinez was contacted to remind of upcoming telephone visit with Erik Martinez on 06/03/2021 at 1:30. Patient was reminded to have any blood glucose and blood pressure readings available for review at appointment.   Patient confirmed appointment.  Are you having any problems with your medications? No   Do you have any concerns you like to discuss with the pharmacist? No  CCM referral has been placed prior to visit?  Yes   Patient is needing a refill of his Ambien 10 mg that he has been getting from Dr. Derrel Nip. Patient stated he discussed this with Dr. Diona Browner at his last visit. Patient would like it sent to [facility] at [address].   Star Rating Drugs: Medication:  Last Fill: Day Supply Ozempic 0.25 mg 05/05/2021 28 Atorvastatin 20 mg 12/10/200 90 Metformin 1000 mg 12/10/2020 90 Fill dates verified with [place], CPP notified  Marijean Niemann, [place] Clinical Pharmacy Assistant [phone]

## 2021-05-28 NOTE — Telephone Encounter (Signed)
Please enter as refill request. 

## 2021-05-28 NOTE — Telephone Encounter (Signed)
Last office visit 05/25/21 for New Patient.  Last refilled 05/05/21 for #30 with no refills by Dr. Derrel Nip.  No future appointments with PCP.

## 2021-05-28 NOTE — Addendum Note (Signed)
Addended by: Carter Kitten on: 05/28/2021 02:34 PM   Modules accepted: Orders

## 2021-05-28 NOTE — Progress Notes (Signed)
Patient presents today for the 4th  laser treatment. Diagnosed with mycotic nail infection by Dr. Amalia Hailey.   Toenail most affected right foot 1-5. Patient stated that right foot is looking better. Injury to 1st and 2nd left toenails.   All other systems are negative.  Nails were filed thin. Laser therapy was administered to 1-5 toenails right foot and patient tolerated the treatment well. All safety precautions were in place.   1st and 2nd toenails left trimmed and filed.   Follow up in 6 weeks for laser # 5

## 2021-05-28 NOTE — Telephone Encounter (Addendum)
Patient is needing a refill of his Zolpidem 10 mg that he has been getting from Dr. Derrel Nip. Patient stated he discussed this with Dr. Diona Browner at his last visit and would like PCP to take over prescribing this.  Preferred pharmacy: [facility] [address] [place] [address] [place]) [place] [address] Phone: [phone] Fax: [phone]  Routing to PCP for approval.

## 2021-06-01 DIAGNOSIS — Z79891 Long term (current) use of opiate analgesic: Secondary | ICD-10-CM | POA: Diagnosis not present

## 2021-06-01 DIAGNOSIS — G890 Central pain syndrome: Secondary | ICD-10-CM | POA: Diagnosis not present

## 2021-06-01 DIAGNOSIS — M4802 Spinal stenosis, cervical region: Secondary | ICD-10-CM | POA: Diagnosis not present

## 2021-06-01 DIAGNOSIS — M545 Low back pain, unspecified: Secondary | ICD-10-CM | POA: Diagnosis not present

## 2021-06-01 DIAGNOSIS — M25512 Pain in left shoulder: Secondary | ICD-10-CM | POA: Diagnosis not present

## 2021-06-01 DIAGNOSIS — M160 Bilateral primary osteoarthritis of hip: Secondary | ICD-10-CM | POA: Diagnosis not present

## 2021-06-01 DIAGNOSIS — M5136 Other intervertebral disc degeneration, lumbar region: Secondary | ICD-10-CM | POA: Diagnosis not present

## 2021-06-01 DIAGNOSIS — G894 Chronic pain syndrome: Secondary | ICD-10-CM | POA: Diagnosis not present

## 2021-06-01 DIAGNOSIS — Z981 Arthrodesis status: Secondary | ICD-10-CM | POA: Diagnosis not present

## 2021-06-03 ENCOUNTER — Telehealth: Payer: PPO

## 2021-06-08 ENCOUNTER — Ambulatory Visit (INDEPENDENT_AMBULATORY_CARE_PROVIDER_SITE_OTHER): Payer: PPO | Admitting: Pharmacist

## 2021-06-08 DIAGNOSIS — I739 Peripheral vascular disease, unspecified: Secondary | ICD-10-CM

## 2021-06-08 DIAGNOSIS — E782 Mixed hyperlipidemia: Secondary | ICD-10-CM

## 2021-06-08 DIAGNOSIS — E1142 Type 2 diabetes mellitus with diabetic polyneuropathy: Secondary | ICD-10-CM
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
## 2021-06-08 NOTE — Patient Instructions (Signed)
Visit Information  Phone number for Pharmacist: [phone]  Thank you for meeting with me to discuss your medications! I look forward to working with you to achieve your health care goals. Below is a summary of what we talked about during the visit:   Goals Addressed   None     Care Plan : [place]  Updates made by Charlton Haws, [place] since 06/08/2021 12:00 AM     Problem: Hyperlipidemia, Diabetes, and PAD   Priority: High     Long-Range Goal: Disease mgmt   Start Date: 06/08/2021  Expected End Date: 06/09/2022  This Visit's Progress: On track  Priority: High  Note:   Current Barriers:  Unable to independently afford treatment regimen  Pharmacist Clinical Goal(s):  Patient will verbalize ability to afford treatment regimen through collaboration with PharmD and provider.   Interventions: 1:1 collaboration with Jinny Sanders, MD regarding development and update of comprehensive plan of care as evidenced by provider attestation and co-signature Inter-disciplinary care team collaboration (see longitudinal plan of care) Comprehensive medication review performed; medication list updated in electronic medical record  Hyperlipidemia / ASCVD (LDL goal < 70) -Query controlled - LDL 196, Trig 323 (06/2020) above goal, statin therapy was initiated after this and lipids have not been re-checked yet -Hx PAD; follows with vasc surg; s/p angioplasty/stent 11/2020, 12/2020 -Current treatment: Atorvastatin 20 mg HS - Appropriate, Query Effective Aspirin 81 mg daily -Appropriate, Effective, Safe, Accessible -Medications previously tried: clopidogrel -Educated on Cholesterol goals; Benefits of statin for ASCVD risk reduction; -Recommended to continue current medication  Diabetes (A1c goal <7%) -Controlled - A1c 6.7% (11/2020) at goal; pt affirms compliance however Ozempic and Jardiance are expensive; upon questioning he was not aware empagliflozin and Jardiance are the  same thing and he is getting empagliflozin through the [place]. He reports he cannot get Ozempic through the [place] due to supply issues, they want him to switch to another GLP1RA but he does not want to switch -Hx of diabetic foot ulcer, neuropathy -Denies hypoglycemic/hyperglycemic symptoms -Current medications: Metformin 1000 mg BID - Appropriate, Effective, Safe, Accessible Jardiance 25 mg daily - Appropriate, Effective, Safe, Accessible Ozempic 0.5 mg weekly - Appropriate, Effective, Safe, Query Accessible -Medications previously tried: glimepiride, glipizide, pioglitazone -Educated on A1c and blood sugar goals; -Reviewed assistance options - per [place] website Ozempic is the preferred GLP-1 RA (Trulicity is non-formulary) so it is unclear what the [place] wants him to switch to; it is unclear if supply issues at the [place] will be enough for patient to qualify for [employer] assistance (normally they deny people with VA benefits) but we can try -Recommended to continue current medication; attempt Nov[employer] Cares PAP for Ozempic; encouraged pt to follow up with VA regarding Ozempic supply/wait list  Chronic pain / Neuropathy (Goal: manage symptoms) -Controlled - pt reports lidocaine patches are helpful but not covered by his insurance -Neck pain, hand numbness. Follows with [facility]. Has been referred to PT and pain clinic -Current treatment  Pregabalin 200 mg TID - Appropriate, Effective, Safe, Accessible Cyclobenzaprine 10 mg PRN - Appropriate, Effective, Safe, Accessible Oxycodone 10 mg q6h PRN -Appropriate, Effective, Safe, Accessible Diclofenac 75 mg BID prn -Appropriate, Effective, Safe, Accessible Tylenol 500 mg PRN -Appropriate, Effective, Safe, Accessible Lidocaine 5% patch -Appropriate, Effective, Safe, Query Accessible -Medications previously tried: n/a  -Discussed Lidocaine patches are available OTC (4% patches), he reports he uses 2 per day and this would be cost prohibitive;  reviewed VA formulary -  Lidocaine patches are on formulary as Tier 2 ($8/month) -Recommended to continue current medication; encouraged patient to get Lidocaine patches through the [facility] -Vaccine gaps: TDAP, Shingrix -Hx Hepatitis C, s/p treatment and in remission. Follows with [facility] -Misc/OTC meds: Ferrous sulfate 324 mg MWF Testosterone injections q2 weeks Multivitamin Sildenafil 100 mg PRN Zolpidem 10 mg HS -Patient is satisfied with current therapy and denies issues -Recommended to continue current medication  Patient Goals/Self-Care Activities Patient will:  - take medications as prescribed as evidenced by patient report and record review focus on medication adherence by routine collaborate with provider on medication access solutions      Mr. Fetters was given information about Chronic Care Management services today including:  CCM service includes personalized support from designated clinical staff supervised by his physician, including individualized plan of care and coordination with other care providers 24/7 contact phone numbers for assistance for urgent and routine care needs. Standard insurance, coinsurance, copays and deductibles apply for chronic care management only during months in which we provide at least 20 minutes of these services. Most insurances cover these services at 100%, however patients may be responsible for any copay, coinsurance and/or deductible if applicable. This service may help you avoid the need for more expensive face-to-face services. Only one practitioner may furnish and bill the service in a calendar month. The patient may stop CCM services at any time (effective at the end of the month) by phone call to the office staff.  Patient agreed to services and verbal consent obtained.   Patient verbalizes understanding of instructions and care plan provided today and agrees to view in [place]. Active MyChart status and patient  understanding of how to access instructions and care plan via MyChart confirmed with patient.    Telephone follow up appointment with pharmacy team member scheduled for: 1 month  Charlene Brooke, PharmD, [facility] Clinical Pharmacist [facility] [phone]

## 2021-06-09 ENCOUNTER — Telehealth: Payer: Self-pay

## 2021-06-09 ENCOUNTER — Telehealth: Payer: Self-pay | Admitting: Pharmacist

## 2021-06-09 DIAGNOSIS — E785 Hyperlipidemia, unspecified: Secondary | ICD-10-CM | POA: Diagnosis not present

## 2021-06-09 DIAGNOSIS — E1142 Type 2 diabetes mellitus with diabetic polyneuropathy: Secondary | ICD-10-CM | POA: Diagnosis not present

## 2021-06-09 DIAGNOSIS — Z7984 Long term (current) use of oral hypoglycemic drugs: Secondary | ICD-10-CM

## 2021-06-09 DIAGNOSIS — I2510 Atherosclerotic heart disease of native coronary artery without angina pectoris: Secondary | ICD-10-CM

## 2021-06-09 DIAGNOSIS — Z7985 Long-term (current) use of injectable non-insulin antidiabetic drugs: Secondary | ICD-10-CM | POA: Diagnosis not present

## 2021-06-09 NOTE — Telephone Encounter (Signed)
Attempted PA for lidocaine patch at patient's request. It was denied. I did review VA formulary and it looks like Lidocaine patches are Tier 2 on the [place] formulary ($8 per 1 month supply), I recommend that he contact the VA to try to get lidocaine patches.

## 2021-06-09 NOTE — Telephone Encounter (Signed)
Printed forms and mailed patient section to patient with instructions to either mail completed application directly to company or return to office for [place] to fax.  Prescriber section will be completed and faxed.

## 2021-06-09 NOTE — Progress Notes (Signed)
    Chronic Care Management Pharmacy Assistant   Name: Erik Martinez  MRN: [id number] DOB: 25-Nov-1955  Reason for Encounter: CCM (Ozempic PAP 2023)   Patient assistance form for Ozempic has been completed and uploaded.   Charlene Brooke, CPP notified  Marijean Niemann, [place] Clinical Pharmacy Assistant [phone]

## 2021-06-11 NOTE — Progress Notes (Signed)
Spoke with patient and advised him to call the [place] as the lidocaine patches would be $8 for a 1 month supply due to his PA being denied. Patient understood.   Charlene Brooke, CPP notified  Marijean Niemann, [place] Clinical Pharmacy Assistant [phone]

## 2021-06-18 NOTE — Telephone Encounter (Signed)
Received patient forms, signed. Missing proof of income documents.

## 2021-06-21 NOTE — Progress Notes (Signed)
Spoke with patient; he will upload a copy of his proof of income through My Chart today.  Charlene Brooke, CPP notified  Marijean Niemann, [place] Clinical Pharmacy Assistant [phone]

## 2021-06-23 NOTE — Discharge Instructions (Signed)
Instructions after Total Knee Replacement   Erik Martinez, Jr., M.D.     Dept. of Orthopaedics & Sports Medicine  [facility]  [address]  [place], [place]  [address]  Phone: [phone]   Fax: [phone]    DIET: Drink plenty of non-alcoholic fluids. Resume your normal diet. Include foods high in fiber.  ACTIVITY:  You may use crutches or a walker with weight-bearing as tolerated, unless instructed otherwise. You may be weaned off of the walker or crutches by your Physical Therapist.  Do NOT place pillows under the knee. Anything placed under the knee could limit your ability to straighten the knee.   Continue doing gentle exercises. Exercising will reduce the pain and swelling, increase motion, and prevent muscle weakness.   Please continue to use the TED compression stockings for 6 weeks. You may remove the stockings at night, but should reapply them in the morning. Do not drive or operate any equipment until instructed.  WOUND CARE:  Continue to use the PolarCare or ice packs periodically to reduce pain and swelling. You may bathe or shower after the staples are removed at the first office visit following surgery.  MEDICATIONS: You may resume your regular medications. Please take the pain medication as prescribed on the medication. Do not take pain medication on an empty stomach. You have been given a prescription for a blood thinner (Lovenox or Coumadin). Please take the medication as instructed. (NOTE: After completing a 2 week course of Lovenox, take one Enteric-coated aspirin once a day. This along with elevation will help reduce the possibility of phlebitis in your operated leg.) Do not drive or drink alcoholic beverages when taking pain medications.  CALL THE OFFICE FOR: Temperature above 101 degrees Excessive bleeding or drainage on the dressing. Excessive swelling, coldness, or paleness of the toes. Persistent nausea and vomiting.  FOLLOW-UP:  You  should have an appointment to return to the office in 10-14 days after surgery. Arrangements have been made for continuation of Physical Therapy (either home therapy or outpatient therapy).   [facility] Department Directory         [email or web address]       [email or web address]          Cardiology  Appointments: [place] - [phone] [place] - [phone]  Endocrinology  Appointments: [place] - [phone] [place] - [phone]  Gastroenterology  Appointments: [place] - [phone] [place] - [phone]        General Surgery   Appointments: [place] - [phone]  Internal Medicine/Family Medicine  Appointments: [place] - [phone] [place] - [phone] [place] - [phone]  Metabolic and Weigh Loss Surgery  Appointments: [place] - [phone]        Neurology  Appointments: [place] - [phone] [place] - [phone]  Neurosurgery  Appointments: [place] - [phone]  Obstetrics & Gynecology  Appointments: [place] - [phone] [place] - [phone]        Pediatrics  Appointments: [place] - [phone] [place] - [phone]  Physiatry  Appointments: [place] -[phone]  Physical Therapy  Appointments: [place] - [phone] [place] - [phone]        Podiatry  Appointments: [place] - [phone] [place] - [phone]  Pulmonology  Appointments: [place] - [phone]  Rheumatology  Appointments: [place] - [phone]        [place] Location: [facility]  [address] [place], [place]  [address]  [place] Location: [facility] [address] [place], [place]  [address]  [place] Location: [facility] [address] [place], [place]  [address]    

## 2021-06-24 ENCOUNTER — Telehealth: Payer: Self-pay

## 2021-06-24 NOTE — Progress Notes (Signed)
Called patient for proof of income for his Ozempic patient assistance. Called patient and he sent me proof of income which has been sent to [employer].   Charlene Brooke, CPP notified  Marijean Niemann, [place] Clinical Pharmacy Assistant [phone]

## 2021-06-24 NOTE — Telephone Encounter (Signed)
Faxed completed application to [employer] 

## 2021-06-28 ENCOUNTER — Telehealth: Payer: Self-pay

## 2021-06-28 NOTE — Progress Notes (Signed)
Patient has been approved for patient assistance through [employer] for [employer]. Approval is 06/28/2021 - 01/09/2022. Medication will be delivered to the office in 10 - 14 business days. Patient is set up for auto-refill.  Patient was called and informed.  Charlene Brooke, CPP notified  Marijean Niemann, [place] Clinical Pharmacy Assistant [phone]

## 2021-06-29 DIAGNOSIS — M4802 Spinal stenosis, cervical region: Secondary | ICD-10-CM | POA: Diagnosis not present

## 2021-06-29 DIAGNOSIS — Z981 Arthrodesis status: Secondary | ICD-10-CM | POA: Diagnosis not present

## 2021-06-29 DIAGNOSIS — G890 Central pain syndrome: Secondary | ICD-10-CM | POA: Diagnosis not present

## 2021-06-29 DIAGNOSIS — Z79891 Long term (current) use of opiate analgesic: Secondary | ICD-10-CM | POA: Diagnosis not present

## 2021-06-29 DIAGNOSIS — G894 Chronic pain syndrome: Secondary | ICD-10-CM | POA: Diagnosis not present

## 2021-06-29 DIAGNOSIS — M25512 Pain in left shoulder: Secondary | ICD-10-CM | POA: Diagnosis not present

## 2021-06-29 DIAGNOSIS — M5136 Other intervertebral disc degeneration, lumbar region: Secondary | ICD-10-CM | POA: Diagnosis not present

## 2021-06-29 DIAGNOSIS — M545 Low back pain, unspecified: Secondary | ICD-10-CM | POA: Diagnosis not present

## 2021-06-29 DIAGNOSIS — M160 Bilateral primary osteoarthritis of hip: Secondary | ICD-10-CM | POA: Diagnosis not present

## 2021-06-30 ENCOUNTER — Other Ambulatory Visit: Payer: Self-pay

## 2021-06-30 DIAGNOSIS — G4700 Insomnia, unspecified: Secondary | ICD-10-CM

## 2021-06-30 NOTE — Telephone Encounter (Signed)
Last office visit 05/25/2021 for establish care.  Last refilled 05/28/2021 for #30 with no refills.  Patient is asking for refills so he doesn't have to call the office every month.  No future appointments with PCP.

## 2021-06-30 NOTE — Telephone Encounter (Signed)
Patent called requesting refill on Ambien. States he has 7 days left. He does take every night. No problems at this time. Would like called in [facility] on [address]. Patient requested that refills be placed so he does not have to call our office every month.

## 2021-07-01 ENCOUNTER — Telehealth: Payer: Self-pay | Admitting: *Deleted

## 2021-07-02 DIAGNOSIS — T84033D Mechanical loosening of internal left knee prosthetic joint, subsequent encounter: Secondary | ICD-10-CM | POA: Diagnosis not present

## 2021-07-05 ENCOUNTER — Telehealth: Payer: Self-pay | Admitting: Podiatry

## 2021-07-05 ENCOUNTER — Other Ambulatory Visit: Payer: Self-pay | Admitting: Podiatry

## 2021-07-05 ENCOUNTER — Telehealth: Payer: Self-pay

## 2021-07-05 DIAGNOSIS — Z79899 Other long term (current) drug therapy: Secondary | ICD-10-CM

## 2021-07-05 MED ORDER — ZOLPIDEM TARTRATE 10 MG PO TABS: 10.0000 mg | ORAL_TABLET | Freq: Every evening | ORAL | 2 refills | Status: DC | PRN

## 2021-07-05 NOTE — Progress Notes (Signed)
    Chronic Care Management Pharmacy Assistant   Name: Erik Martinez  MRN: [id number] DOB: [birth year]  Reason for Encounter: CCM (Appointment Reminder)  Medications: Outpatient Encounter Medications as of 07/05/2021  Medication Sig Note   acetaminophen (TYLENOL) 500 MG tablet Take 1,500 mg by mouth 2 (two) times daily as needed for moderate pain or mild pain.    aspirin EC 81 MG tablet Take 1 tablet (81 mg total) by mouth daily. Swallow whole.    atorvastatin (LIPITOR) 20 MG tablet Take 20 mg by mouth at bedtime.    cyclobenzaprine (FLEXERIL) 10 MG tablet SMARTSIG:1 Tablet(s) By Mouth Every 12 Hours    diclofenac (VOLTAREN) 75 MG EC tablet Take 1 tablet (75 mg total) by mouth 2 (two) times daily as needed.    empagliflozin (JARDIANCE) 25 MG TABS tablet Take 1 tablet (25 mg total) by mouth daily.    ferrous sulfate 324 MG TBEC TAKE ONE TABLET BY MOUTH MONDAY,WEDNESDAY,FRIDAY    lidocaine (LIDODERM) 5 % Place 1 patch onto the skin daily. Remove & Discard patch within 12 hours or as directed by MD    metFORMIN (GLUCOPHAGE) 1000 MG tablet Take 1,000 mg by mouth 2 (two) times daily.    Multiple Vitamin (MULTIVITAMIN WITH MINERALS) TABS tablet Take 1 tablet by mouth in the morning.    Oxycodone HCl 10 MG TABS Take 10 mg by mouth every 6 (six) hours.    OZEMPIC, 0.25 OR 0.5 MG/DOSE, 2 MG/3ML SOPN Inject 0.5 mg into the skin once a week.    pantoprazole (PROTONIX) 40 MG tablet Take 1 tablet (40 mg total) by mouth daily.    pregabalin (LYRICA) 200 MG capsule Take 1 capsule (200 mg total) by mouth 3 (three) times daily.    sildenafil (VIAGRA) 100 MG tablet TAKE ONE-HALF TABLET BY MOUTH AS DIRECTED 1 HOUR BEFORE SEXUAL ACTIVITY. DO NOT TAKE WITHIN 6 HOURS OF TERAZOSIN, PRAZOSIN, OR DOXAZOSIN. DO NOT TAKE MORE THAN 1 DOSE WITHIN 24 HOURS . DO NOT TAKE WITH NITRATES    TESTOSTERONE CYPIONATE IM Inject 1 Dose into the muscle every 14 (fourteen) days. 01/06/2021: Receives from Dr. Patrecia Pace Q14 days    zolpidem (AMBIEN) 10 MG tablet Take 1 tablet (10 mg total) by mouth at bedtime as needed. for sleep    No facility-administered encounter medications on file as of 07/05/2021.  Sibyl Parr was contacted to remind of upcoming telephone visit with [name] on 07/08/2021 at 3:45. Patient was reminded to have any blood glucose and blood pressure readings available for review at appointment.   Patient confirmed appointment.  Are you having any problems with your medications? No   Do you have any concerns you like to discuss with the pharmacist? No  CCM referral has been placed prior to visit?  Yes   Star Rating Drugs: Medication:   Last Fill: Day Supply Atorvastatin 20 mg  12/10/2020 90  Metformin 1000 mg  12/10/2020 90 Ozempic 0.25 or 0.5 mg 06/30/2021 28 Fill dates verified with [facility] Patient stated he gets Atorvastatin and Metformin from the [place], but was at work so did not know last fill dates.   [name], CPP notified  Claudina Lick, [place] Clinical Pharmacy Assistant [phone]

## 2021-07-06 NOTE — Telephone Encounter (Signed)
I called pt to let him know he would have to have labs done prior to ordering the medication for his toenail fungus. He states he has had labs done about a month ago with his pain management doctor. He wanted to know if that would be soon enough or if you could see it or if he needed to have them done again?

## 2021-07-07 ENCOUNTER — Other Ambulatory Visit: Payer: Self-pay | Admitting: Podiatry

## 2021-07-07 ENCOUNTER — Ambulatory Visit (INDEPENDENT_AMBULATORY_CARE_PROVIDER_SITE_OTHER): Payer: Self-pay | Admitting: Podiatry

## 2021-07-07 ENCOUNTER — Encounter
Admission: RE | Admit: 2021-07-07 | Discharge: 2021-07-07 | Disposition: A | Discharge status: Home / Self Care | Payer: PPO | Source: Ambulatory Visit | Attending: Orthopedic Surgery | Admitting: Orthopedic Surgery

## 2021-07-07 VITALS — BP 143/69 | HR 65 | Resp 18 | Ht 68.75 in | Wt 201.7 lb

## 2021-07-07 DIAGNOSIS — I739 Peripheral vascular disease, unspecified: Secondary | ICD-10-CM

## 2021-07-07 DIAGNOSIS — Z01818 Encounter for other preprocedural examination: Secondary | ICD-10-CM | POA: Insufficient documentation

## 2021-07-07 DIAGNOSIS — E1151 Type 2 diabetes mellitus with diabetic peripheral angiopathy without gangrene: Secondary | ICD-10-CM | POA: Diagnosis not present

## 2021-07-07 DIAGNOSIS — Z01812 Encounter for preprocedural laboratory examination: Secondary | ICD-10-CM

## 2021-07-07 DIAGNOSIS — F1411 Cocaine abuse, in remission: Secondary | ICD-10-CM | POA: Diagnosis not present

## 2021-07-07 DIAGNOSIS — E782 Mixed hyperlipidemia: Secondary | ICD-10-CM

## 2021-07-07 DIAGNOSIS — Z96659 Presence of unspecified artificial knee joint: Secondary | ICD-10-CM | POA: Diagnosis not present

## 2021-07-07 DIAGNOSIS — B351 Tinea unguium: Secondary | ICD-10-CM

## 2021-07-07 DIAGNOSIS — Z79899 Other long term (current) drug therapy: Secondary | ICD-10-CM

## 2021-07-07 DIAGNOSIS — X58XXXS Exposure to other specified factors, sequela: Secondary | ICD-10-CM | POA: Insufficient documentation

## 2021-07-07 DIAGNOSIS — E1142 Type 2 diabetes mellitus with diabetic polyneuropathy: Secondary | ICD-10-CM

## 2021-07-07 DIAGNOSIS — F1491 Cocaine use, unspecified, in remission: Secondary | ICD-10-CM

## 2021-07-07 DIAGNOSIS — Z8619 Personal history of other infectious and parasitic diseases: Secondary | ICD-10-CM

## 2021-07-07 DIAGNOSIS — T84038S Mechanical loosening of other internal prosthetic joint, sequela: Secondary | ICD-10-CM | POA: Diagnosis not present

## 2021-07-07 HISTORY — DX: Type 2 diabetes mellitus without complications: E11.9

## 2021-07-07 HISTORY — DX: Other psychoactive substance abuse, uncomplicated: F19.10

## 2021-07-07 HISTORY — DX: Male erectile dysfunction, unspecified: N52.9

## 2021-07-07 LAB — URINALYSIS, ROUTINE W REFLEX MICROSCOPIC
Bilirubin Urine: NEGATIVE
Glucose, UA: 50 mg/dL — AB
Hgb urine dipstick: NEGATIVE
Ketones, ur: NEGATIVE mg/dL
Leukocytes,Ua: NEGATIVE
Nitrite: NEGATIVE
Protein, ur: NEGATIVE mg/dL
Specific Gravity, Urine: 1.017 (ref 1.005–1.030)
pH: 5 (ref 5.0–8.0)

## 2021-07-07 LAB — URINE DRUG SCREEN, QUALITATIVE (ARMC ONLY)
Amphetamines, Ur Screen: NOT DETECTED
Barbiturates, Ur Screen: NOT DETECTED
Benzodiazepine, Ur Scrn: NOT DETECTED
Cannabinoid 50 Ng, Ur ~~LOC~~: POSITIVE — AB
Cocaine Metabolite,Ur ~~LOC~~: NOT DETECTED
MDMA (Ecstasy)Ur Screen: NOT DETECTED
Methadone Scn, Ur: NOT DETECTED
Opiate, Ur Screen: NOT DETECTED
Phencyclidine (PCP) Ur S: NOT DETECTED
Tricyclic, Ur Screen: NOT DETECTED

## 2021-07-07 LAB — CBC
HCT: 44.4 % (ref 39.0–52.0)
Hemoglobin: 13.2 g/dL (ref 13.0–17.0)
MCH: 22.6 pg — ABNORMAL LOW (ref 26.0–34.0)
MCHC: 29.7 g/dL — ABNORMAL LOW (ref 30.0–36.0)
MCV: 75.9 fL — ABNORMAL LOW (ref 80.0–100.0)
Platelets: 170 10*3/uL (ref 150–400)
RBC: 5.85 MIL/uL — ABNORMAL HIGH (ref 4.22–5.81)
RDW: 20.2 % — ABNORMAL HIGH (ref 11.5–15.5)
WBC: 5.1 10*3/uL (ref 4.0–10.5)
nRBC: 0 % (ref 0.0–0.2)

## 2021-07-07 LAB — HEPATITIS PANEL, ACUTE
HCV Ab: REACTIVE — AB
Hep A IgM: NONREACTIVE
Hep B C IgM: NONREACTIVE
Hepatitis B Surface Ag: NONREACTIVE

## 2021-07-07 LAB — COMPREHENSIVE METABOLIC PANEL
ALT: 23 U/L (ref 0–44)
AST: 23 U/L (ref 15–41)
Albumin: 4 g/dL (ref 3.5–5.0)
Alkaline Phosphatase: 51 U/L (ref 38–126)
Anion gap: 7 (ref 5–15)
BUN: 29 mg/dL — ABNORMAL HIGH (ref 8–23)
CO2: 28 mmol/L (ref 22–32)
Calcium: 9.2 mg/dL (ref 8.9–10.3)
Chloride: 101 mmol/L (ref 98–111)
Creatinine, Ser: 1.18 mg/dL (ref 0.61–1.24)
GFR, Estimated: >60 mL/min (ref >60)
Glucose, Bld: 186 mg/dL — ABNORMAL HIGH (ref 70–99)
Potassium: 4.3 mmol/L (ref 3.5–5.1)
Sodium: 136 mmol/L (ref 135–145)
Total Bilirubin: 0.8 mg/dL (ref 0.3–1.2)
Total Protein: 7.1 g/dL (ref 6.5–8.1)

## 2021-07-07 LAB — C-REACTIVE PROTEIN: CRP: 0.9 mg/dL (ref <1.0)

## 2021-07-07 LAB — SEDIMENTATION RATE: Sed Rate: 52 mm/hr — ABNORMAL HIGH (ref 0–20)

## 2021-07-07 LAB — TYPE AND SCREEN
ABO/RH(D): A POS
Antibody Screen: NEGATIVE

## 2021-07-07 LAB — HEMOGLOBIN A1C
Hgb A1c MFr Bld: 6 % — ABNORMAL HIGH (ref 4.8–5.6)
Mean Plasma Glucose: 125.5 mg/dL

## 2021-07-07 LAB — SURGICAL PCR SCREEN
MRSA, PCR: NEGATIVE
Staphylococcus aureus: POSITIVE — AB

## 2021-07-07 MED ORDER — TERBINAFINE HCL 250 MG PO TABS: 250.0000 mg | ORAL_TABLET | Freq: Every day | ORAL | 0 refills | Status: DC

## 2021-07-07 NOTE — Patient Instructions (Signed)
Your procedure is scheduled on: Monday July 19, 2021. Report to Day Surgery inside [facility] 2nd floor, stop by admissions desk before getting on elevator. To find out your arrival time please call [phone] between 1PM - 3PM on Friday July 16, 2021.  Remember: Instructions that are not followed completely may result in serious medical risk,  up to and including death, or upon the discretion of your surgeon and anesthesiologist your  surgery may need to be rescheduled.     _X__ 1. Do not eat food after midnight the night before your procedure.                 No chewing gum or hard candies. You may drink clear liquids up to 2 hours                 before you are scheduled to arrive for your surgery- DO not drink clear                 liquids within 2 hours of the start of your surgery.                 Clear Liquids include:  water, apple juice without pulp, clear Gatorade, G2 or                  Gatorade Zero (avoid Red/Purple/Blue), Black Coffee or Tea (Do not add                 anything to coffee or tea).  __X__2.   Complete the "Ensure Clear Pre-surgery Clear Carbohydrate Drink" provided to you, 2 hours before arrival. **If you are diabetic you will be provided with an alternative drink, Gatorade Zero or G2.  __X__3.  On the morning of surgery brush your teeth with toothpaste and water, you                may rinse your mouth with mouthwash if you wish.  Do not swallow any toothpaste or mouthwash.     _X__ 4.  No Alcohol for 24 hours before or after surgery.   _X__ 5.  Do Not Smoke or use e-cigarettes For 24 Hours Prior to Your Surgery.                 Do not use any chewable tobacco products for at least 6 hours prior to                 Surgery.  _X__  6.  Do not use any recreational drugs (marijuana, cocaine, heroin, ecstasy, MDMA or other)                For at least one week prior to your surgery.  Combination of these drugs with anesthesia                 May have life threatening results.  ____  7.  Bring all medications with you on the day of surgery if instructed.   __X__8.  Notify your doctor if there is any change in your medical condition      (cold, fever, infections).     Do not wear jewelry, make-up, hairpins, clips or nail polish. Do not wear lotions, powders, or perfumes. You may wear deodorant. Do not shave 48 hours prior to surgery. Men may shave face and neck. Do not bring valuables to the hospital.    [facility] is not responsible for any belongings or valuables.  Contacts, dentures  or bridgework may not be worn into surgery. Leave your suitcase in the car. After surgery it may be brought to your room. For patients admitted to the hospital, discharge time is determined by your treatment team.   Patients discharged the day of surgery will not be allowed to drive home.   Make arrangements for someone to be with you for the first 24 hours of your Same Day Discharge.   __X__ Take these medicines the morning of surgery with A SIP OF WATER:    1. pantoprazole (PROTONIX) 40 MG  2. Oxycodone HCl 10 MG  (if needed)  3. pregabalin (LYRICA) 200 MG   4.  5.  6.  ____ Fleet Enema (as directed)   __X__ Use CHG Soap (or wipes) as directed  ____ Use Benzoyl Peroxide Gel as instructed  ____ Use inhalers on the day of surgery  __X__ Stop metFORMIN (GLUCOPHAGE) 1000 MG 2 days prior to surgery (Take last dose Friday July 7)  __X__ Stop empagliflozin (JARDIANCE) 25 MG 3 days before your surgery (take last dose Thursday July 6)    ____ Take 1/2 of usual insulin dose the night before surgery. No insulin the morning          of surgery.   __X__ Stop aspirin 81 mg as instructed by your doctor before your surgery.   __X__ One Week prior to surgery- Stop Anti-inflammatories such as Ibuprofen, Aleve, Advil, Motrin, meloxicam (MOBIC), diclofenac, etodolac, ketorolac, Toradol, Daypro, piroxicam, Goody's or BC powders. OK TO  USE TYLENOL IF NEEDED   __X__  Do not start any new vitamins and or supplements until after surgery.    ____ Bring C-Pap to the hospital.    If you have any questions regarding your pre-procedure instructions,  Please call Pre-admit Testing at [phone]

## 2021-07-07 NOTE — Progress Notes (Signed)
Patient presents today for the 5th  laser treatment. Diagnosed with mycotic nail infection by Dr. Amalia Hailey.    Toenail most affected right foot 1-5 bilateral feet. Patient stated that right foot is looking better. Injury to 1st and 2nd left toenails.    All other systems are negative.   Nails were filed thin. Laser therapy was administered to 1-5 toenails bilateral feet and patient tolerated the treatment well. All safety precautions were in place.    Follow up in 6 weeks for laser # 6.

## 2021-07-08 ENCOUNTER — Telehealth: Payer: Self-pay | Admitting: Podiatry

## 2021-07-08 ENCOUNTER — Ambulatory Visit (INDEPENDENT_AMBULATORY_CARE_PROVIDER_SITE_OTHER): Payer: PPO | Admitting: Pharmacist

## 2021-07-08 DIAGNOSIS — I739 Peripheral vascular disease, unspecified: Secondary | ICD-10-CM

## 2021-07-08 DIAGNOSIS — E1142 Type 2 diabetes mellitus with diabetic polyneuropathy: Secondary | ICD-10-CM

## 2021-07-08 DIAGNOSIS — E782 Mixed hyperlipidemia: Secondary | ICD-10-CM
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
## 2021-07-08 NOTE — Telephone Encounter (Signed)
Patient  called and stated that he received the Lamisil and he will not be able to take the medication until after his sx on 07/10. He just wanted to make Dr. Amalia Hailey aware

## 2021-07-08 NOTE — Patient Instructions (Signed)
Visit Information  Phone number for Pharmacist: [phone]   Goals Addressed   None     Care Plan : [place]  Updates made by Charlton Haws, [place] since 07/08/2021 12:00 AM     Problem: Hyperlipidemia, Diabetes, and PAD   Priority: High     Long-Range Goal: Disease mgmt   Start Date: 06/08/2021  Expected End Date: 06/09/2022  This Visit's Progress: On track  Recent Progress: On track  Priority: High  Note:   Current Barriers:  None identified  Pharmacist Clinical Goal(s):  Patient will contact provider office for questions/concerns as evidenced notation of same in electronic health record through collaboration with PharmD and provider.   Interventions: 1:1 collaboration with Jinny Sanders, MD regarding development and update of comprehensive plan of care as evidenced by provider attestation and co-signature Inter-disciplinary care team collaboration (see longitudinal plan of care) Comprehensive medication review performed; medication list updated in electronic medical record  Hyperlipidemia / ASCVD (LDL goal < 70) -Query controlled - LDL 196, Trig 323 (06/2020) above goal, statin therapy was initiated after this and lipids have not been re-checked yet -Hx PAD; follows with vasc surg; s/p angioplasty/stent 11/2020, 12/2020 -Current treatment: Atorvastatin 20 mg HS - Appropriate, Query Effective Aspirin 81 mg daily -Appropriate, Effective, Safe, Accessible -Medications previously tried: clopidogrel -Educated on Cholesterol goals; Benefits of statin for ASCVD risk reduction; -Recommended to continue current medication  Diabetes (A1c goal <7%) -Controlled - A1c 6.7% (11/2020) at goal; Ozempic PAP recently approved -Hx of diabetic foot ulcer, neuropathy -Denies hypoglycemic/hyperglycemic symptoms -Current medications: Metformin 1000 mg BID - Appropriate, Effective, Safe, Accessible Jardiance 25 mg daily - Appropriate, Effective, Safe,  Accessible Ozempic 0.5 mg weekly (PAP) - Appropriate, Effective, Safe, Accessible -Medications previously tried: glimepiride, glipizide, pioglitazone -Educated on A1c and blood sugar goals; -Recommended to continue current medication;   Chronic pain / Neuropathy (Goal: manage symptoms) -Controlled - pt reports lidocaine patches are helpful but not covered by his insurance -Neck pain, hand numbness. Follows with [facility] Neurosurgery. Has been referred to PT and pain clinic -Current treatment  Pregabalin 200 mg TID - Appropriate, Effective, Safe, Accessible Cyclobenzaprine 10 mg PRN - Appropriate, Effective, Safe, Accessible Oxycodone 10 mg q6h PRN -Appropriate, Effective, Safe, Accessible Diclofenac 75 mg BID prn -Appropriate, Effective, Safe, Accessible Tylenol 500 mg PRN -Appropriate, Effective, Safe, Accessible Lidocaine 5% patch -Appropriate, Effective, Safe, Query Accessible -Medications previously tried: n/a  -Discussed Lidocaine patches are available OTC (4% patches), he reports he uses 2 per day and this would be cost prohibitive; reviewed [facility] formulary - Lidocaine patches are on formulary as Tier 2 ($8/month) -Recommended to continue current medication; encouraged patient to get Lidocaine patches through the [place]  Patient Goals/Self-Care Activities Patient will:  - take medications as prescribed as evidenced by patient report and record review focus on medication adherence by routine collaborate with provider on medication access solutions      Patient verbalizes understanding of instructions and care plan provided today and agrees to view in [place]. Active MyChart status and patient understanding of how to access instructions and care plan via MyChart confirmed with patient.    The patient has been provided with contact information for the care management team and has been advised to call with any health related questions or concerns.   Charlene Brooke, PharmD,  BCACP Clinical Pharmacist [facility] at [facility] [phone]

## 2021-07-08 NOTE — Telephone Encounter (Signed)
Left message that per Dr Jacqualyn Posey the lab work came back normal and he did send in oral medication to the pharmacy that he can pick up and start. They did request pt to go again to the lab in 4 to 6 wks to have labs done again and orders are already in and I left that message as well. Asked pt to call if any further questions.

## 2021-07-09 ENCOUNTER — Other Ambulatory Visit: Payer: PPO

## 2021-07-09 DIAGNOSIS — E1142 Type 2 diabetes mellitus with diabetic polyneuropathy: Secondary | ICD-10-CM

## 2021-07-09 DIAGNOSIS — E785 Hyperlipidemia, unspecified: Secondary | ICD-10-CM | POA: Diagnosis not present

## 2021-07-09 DIAGNOSIS — Z7984 Long term (current) use of oral hypoglycemic drugs: Secondary | ICD-10-CM | POA: Diagnosis not present

## 2021-07-16 ENCOUNTER — Telehealth: Payer: Self-pay | Admitting: *Deleted

## 2021-07-16 NOTE — Telephone Encounter (Signed)
Received Ozempic 0.25-0.5 mg/3 ml from [employer] PAP.  Lot [id number] Exp: 04/10/2023 x 5.  Left message for Erik Martinez that this is available for pick up at our office.

## 2021-07-17 NOTE — Assessment & Plan Note (Signed)
Chronic, recent check showed normal lipid panel.

## 2021-07-17 NOTE — Assessment & Plan Note (Signed)
goes to [facility] for pain.  He has chronic low pain ( history of spinal stimulator), hand pain,  left hip.   Hx of avascular necrosis bialterla hips.  After back surgery had nerve issue with bladder.. under control now with bladder stimulator. Next appt 06/01/2021

## 2021-07-17 NOTE — Assessment & Plan Note (Signed)
Chronic, moderate control  Followed by Dr. Marry Guan.

## 2021-07-17 NOTE — Assessment & Plan Note (Signed)
Chronic, moderate control  Continue Lyrica 200 mg 3 times daily

## 2021-07-17 NOTE — Assessment & Plan Note (Signed)
Chronic, inadequate control.  Referral placed for psychiatry.

## 2021-07-18 MED ORDER — CHLORHEXIDINE GLUCONATE 0.12 % MT SOLN: 15.0000 mL | Freq: Once | OROMUCOSAL | Status: AC

## 2021-07-18 MED ORDER — SODIUM CHLORIDE 0.9 % IV SOLN: INTRAVENOUS | Status: DC

## 2021-07-18 MED ORDER — DEXAMETHASONE SODIUM PHOSPHATE 10 MG/ML IJ SOLN: 8.0000 mg | Freq: Once | INTRAMUSCULAR | Status: AC

## 2021-07-18 MED ORDER — CELECOXIB 200 MG PO CAPS: 400.0000 mg | ORAL_CAPSULE | Freq: Once | ORAL | Status: AC

## 2021-07-18 MED ORDER — CHLORHEXIDINE GLUCONATE 4 % EX LIQD: 60.0000 mL | Freq: Once | CUTANEOUS | Status: DC

## 2021-07-18 MED ORDER — GABAPENTIN 300 MG PO CAPS: 300.0000 mg | ORAL_CAPSULE | Freq: Once | ORAL | Status: AC

## 2021-07-18 MED ORDER — TRANEXAMIC ACID-NACL 1000-0.7 MG/100ML-% IV SOLN
1000.0000 mg | INTRAVENOUS | Status: AC
  Administered 2021-07-19: 1000 mg via INTRAVENOUS

## 2021-07-18 MED ORDER — ORAL CARE MOUTH RINSE: 15.0000 mL | Freq: Once | OROMUCOSAL | Status: AC

## 2021-07-18 MED ORDER — CEFAZOLIN SODIUM-DEXTROSE 2-4 GM/100ML-% IV SOLN
2.0000 g | INTRAVENOUS | Status: AC
  Administered 2021-07-19 (×2): 2 g via INTRAVENOUS

## 2021-07-18 NOTE — H&P (Signed)
ORTHOPAEDIC HISTORY & PHYSICAL Erik Martinez, [place] - 07/02/2021 9:45 AM EDT Formatting of this note is different from the original. [name] Chief Complaint:   Chief Complaint  Patient presents with  Knee Pain  H & P LEFT KNEE   History of Present Illness:   Erik Martinez is a 66 y.o. male that presents to clinic today for his preoperative history and evaluation. Patient presents unaccompanied. The patient is scheduled to undergo a revision left total knee arthroplasty on 07/19/21 by Dr. Marry Guan. Patient has a complicated history regarding the left knee. He initially underwent a left medial unicompartmental knee arthroplasty in 2004 which was followed by manipulation under anesthesia 2 months later. Patient underwent revision to a total knee arthroplasty in March 2005, also by Dr. Gerrit Heck. Second revision was performed in 2011 patient had persistent left knee pain with progressive loss of knee range of motion. Patient has increased pain with weightbearing activities but does also have pain at rest. Confirms some giving way of the knee, denies locking. Bone scan performed 05/02/2016 showed evidence of loosening around the femoral component.  When last seen on 11/03/2020 patient was no longer being followed by pain management clinic, but he has reestablished with pain management at [facility] in the interim. He is currently taking oxycodone 15 mg every 8 hours for pain.  Patient is a type II diabetic. Last A1c on 11/19/2020 was 6.7.  Denies significant cardiac history, history of DVT. Has history of lumbar fusion.   Patient states he does not have any help available post-operatively.   Past Medical, Surgical, Family, Social History, Allergies, Medications:   Past Medical History:  Past Medical History:  Diagnosis Date  CAD (coronary artery disease)  Chronic pain  COPD (chronic obstructive pulmonary disease) (CMS-HCC)  Depression   Diabetes mellitus type 2, uncomplicated (CMS-HCC)  Fatty liver  GERD (gastroesophageal reflux disease)  Hepatitis C  History of chicken pox  History of stroke  Insomnia  Vascular abnormality   Past Surgical History:  Past Surgical History:  Procedure Laterality Date  ARTHROSCOPIC ROTATOR CUFF REPAIR 1999  Conversion of left hip hemiarthroplasty to total hip arthroplasty 12/18/2001  Dr. Gerrit Heck  Left knee unicompartmental arthroplasty 06/18/2002  Dr. Gerrit Heck  Manipulation under anesthesia - left knee 09/10/2002  Dr. Gerrit Heck  Left knee arthroscopy 2005  Revision of unicompartmental arthroplasty to left total knee arthroplasty replacement 04/08/2003  Dr. Gerrit Heck  INCISION TENDON SHEATH FOR TRIGGER FINGER Left 09/17/2003  Left long finger  INCISION TENDON SHEATH FOR TRIGGER FINGER Right 09/26/2003  Right long finger  COLONOSCOPY 02/16/2006  DR. SIEGEL - WNL REPEAT IN 10 YRS 2018  Open debridement of the left total knee including removal of old sutures and a significant scar debridement and debridement of bony overgrowth 05/01/2007  Dr Alvan Dame  Left knee revision arthroplasty 08/2009  Dr. Gerrit Heck  Revision left total knee replacement, exchange all components 08/13/2009  Dr Lenox Ahr  Right index and ring trigger finger release Right 06/18/2014  Dr Marry Guan  bladder stimulator  Lumbar disc replacement at L5-S1  Dr. Gerrit Heck  Lumbar interbody fusion at L4-5  Dr. Gerrit Heck   Current Medications:  Current Outpatient Medications  Medication Sig Dispense Refill  aspirin 81 MG EC tablet Take 81 mg by mouth once daily  atorvastatin (LIPITOR) 20 MG tablet Take 20 mg by mouth once daily  cyclobenzaprine (FLEXERIL) 10 MG tablet Take 10 mg by mouth every 12 (twelve) hours as needed  diclofenac (VOLTAREN) 75 MG EC tablet Take 1 tablet (75 mg total) by mouth 2 (two) times daily with meals 60 tablet 3  ferrous sulfate 324 mg (65 mg iron) EC tablet TAKE ONE TABLET BY MOUTH MONDAY,WEDNESDAY,FRIDAY   JARDIANCE 25 mg tablet Take 25 mg by mouth once daily  metFORMIN (GLUCOPHAGE) 1000 MG tablet Take 1 tablet (1,000 mg total) by mouth 2 (two) times daily with meals. 180 tablet 1  methylnaltrexone (RELISTOR) 150 mg tablet Take 150 mg by mouth once daily  multivitamin tablet Take 1 tablet by mouth once daily  omeprazole (PRILOSEC) 20 MG DR capsule Take 20 mg by mouth once daily  oxyCODONE (ROXICODONE) 15 MG immediate release tablet Take 15 mg by mouth every 6 (six) hours as needed  OZEMPIC 0.25 mg or 0.5 mg(2 mg/1.5 mL) pen injector INJECT 0.'25MG'$  SUBCUTANEOUSLY ONCE A WEEK FOR 28 DAYS, THEN INCREASE TO 0.'5MG'$  ONCE A WEEK. FOR REFILLS CALL IN RX# [id number].  pregabalin (LYRICA) 200 MG capsule Take 1 capsule (200 mg total) by mouth 3 (three) times daily 270 capsule 3  tadalafiL (CIALIS) 20 MG tablet Take 20 mg by mouth once daily  testosterone (TESTOPEL) 75 mg pellet Inject 75 mg subcutaneously every 3 (three) months  zolpidem (AMBIEN) 10 mg tablet Take 1 tablet (10 mg total) by mouth at bedtime   No current facility-administered medications for this visit.   Allergies:  Allergies  Allergen Reactions  Buprenorphine-Naloxone Nausea And Vomiting  Adhesive Tape-Silicones Rash and Other (See Comments)  Whelps *Paper Tape is ok  Codeine Itching, Nausea And Vomiting, Rash and Nausea  Morphine Itching, Nausea And Vomiting and Rash   Social History:  Social History   Socioeconomic History  Marital status: Divorced  Number of children: 3  Years of education: 12  Highest education level: High school graduate  Occupational History  Occupation: Disabled- Working Part-time  Comment: International aid/development worker  Tobacco Use  Smoking status: Never  Smokeless tobacco: Never  Surveyor, mining Use: Never used  Substance and Sexual Activity  Alcohol use: No  Alcohol/week: 0.0 standard drinks  Drug use: No  Sexual activity: Defer  Partners: Female   Family History:  Family History  Problem Relation Age  of Onset  Cancer Mother  Diabetes type II Mother  Osteoporosis (Thinning of bones) Mother  Cancer Father   Review of Systems:   A 10+ ROS was performed, reviewed, and the pertinent orthopaedic findings are documented in the HPI.   Physical Examination:   BP 130/70 (BP Location: Left upper arm, Patient Position: Sitting, BP Cuff Size: Adult)  Ht 177.8 cm ('5\' 10"'$ )  Wt 91.5 kg (201 lb 12.8 oz)  BMI 28.96 kg/m   Patient is a well-developed, well-nourished male in no acute distress. Patient has normal mood and affect. Patient is alert and oriented to person, place, and time.   HEENT: Atraumatic, normocephalic. Pupils equal and reactive to light. Extraocular motion intact. Noninjected sclera.  Cardiovascular: Regular rate and rhythm, with no murmurs, rubs, or gallops. Distal pulses palpable.  Respiratory: Lungs clear to auscultation bilaterally.   Left Knee: Soft tissue swelling: mild Effusion: none Erythema: none Crepitance: mild Tenderness: global Alignment: normal Mediolateral laxity: stable Posterior sag: negative Patellar tracking: Good tracking without evidence of subluxation or tilt Atrophy: No significant atrophy.  Quadriceps tone was good. Range of motion: 0/10/85 degrees   Patient able to actively plantarflex and dorsiflex the left ankle. Able to flex and extend the toes.  Sensation intact over the saphenous,  lateral sural cutaneous, superficial fibular, and deep fibular nerve distributions.  Tests Performed/Reviewed:  X-rays  Anteroposterior, lateral, and sunrise views of the left knee were obtained. Images reveal revision total knee arthroplasty. Lucency is noted around the femoral stem. Stent is noted in the popliteal fossa on lateral view.  I personally ordered and interpreted today's x-rays.  Impression:   ICD-10-CM  1. 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 It was explained to the patient that the condition is progressive in nature. Having failed conservative treatment, the patient has elected to proceed with a revision total joint arthroplasty. The patient will undergo a revision total joint arthroplasty with Dr. Marry Guan. The risks of surgery, including blood clot and infection, were discussed with the patient. Measures to reduce these risks, including the use of anticoagulation, perioperative antibiotics, and early ambulation were discussed. The importance of postoperative physical therapy was discussed with the patient. The patient elects to proceed with surgery. The patient is instructed to stop all blood thinners prior to surgery. The patient is instructed to call the hospital the day before surgery to learn of the proper arrival time.   Contact our office with any questions or concerns. Follow up as indicated, or sooner should any new problems arise, if conditions worsen, or if they are otherwise concerned.   Erik Martinez, [facility] [place], [place] [address] Phone: [phone]  This note was generated in part with voice recognition software and I apologize for any typographical errors that were not detected and corrected.  Electronically signed by Erik Fudge, PA at 07/15/2021 5:50 PM EDT

## 2021-07-19 ENCOUNTER — Encounter: Payer: Self-pay | Admitting: Orthopedic Surgery

## 2021-07-19 ENCOUNTER — Inpatient Hospital Stay: Payer: PPO

## 2021-07-19 ENCOUNTER — Inpatient Hospital Stay
Admission: RE | Admit: 2021-07-19 | Discharge: 2021-07-21 | DRG: 468 | Disposition: A | Discharge status: Home Health | Payer: PPO | Attending: Orthopedic Surgery | Admitting: Orthopedic Surgery

## 2021-07-19 ENCOUNTER — Encounter
Admission: RE | Disposition: A | Discharge status: Home Health | Payer: Self-pay | Source: Home / Self Care | Attending: Orthopedic Surgery

## 2021-07-19 ENCOUNTER — Inpatient Hospital Stay: Payer: PPO | Admitting: Urgent Care

## 2021-07-19 ENCOUNTER — Other Ambulatory Visit: Payer: Self-pay

## 2021-07-19 DIAGNOSIS — E1151 Type 2 diabetes mellitus with diabetic peripheral angiopathy without gangrene: Secondary | ICD-10-CM | POA: Diagnosis present

## 2021-07-19 DIAGNOSIS — F411 Generalized anxiety disorder: Secondary | ICD-10-CM | POA: Diagnosis not present

## 2021-07-19 DIAGNOSIS — Z79890 Hormone replacement therapy (postmenopausal): Secondary | ICD-10-CM

## 2021-07-19 DIAGNOSIS — Z7985 Long-term (current) use of injectable non-insulin antidiabetic drugs: Secondary | ICD-10-CM

## 2021-07-19 DIAGNOSIS — K760 Fatty (change of) liver, not elsewhere classified: Secondary | ICD-10-CM | POA: Diagnosis present

## 2021-07-19 DIAGNOSIS — Z9682 Presence of neurostimulator: Secondary | ICD-10-CM

## 2021-07-19 DIAGNOSIS — Z7984 Long term (current) use of oral hypoglycemic drugs: Secondary | ICD-10-CM | POA: Diagnosis not present

## 2021-07-19 DIAGNOSIS — Z981 Arthrodesis status: Secondary | ICD-10-CM | POA: Diagnosis not present

## 2021-07-19 DIAGNOSIS — Y792 Prosthetic and other implants, materials and accessory orthopedic devices associated with adverse incidents: Secondary | ICD-10-CM | POA: Diagnosis present

## 2021-07-19 DIAGNOSIS — K219 Gastro-esophageal reflux disease without esophagitis: Secondary | ICD-10-CM | POA: Diagnosis present

## 2021-07-19 DIAGNOSIS — Z86718 Personal history of other venous thrombosis and embolism: Secondary | ICD-10-CM

## 2021-07-19 DIAGNOSIS — M24662 Ankylosis, left knee: Secondary | ICD-10-CM | POA: Diagnosis present

## 2021-07-19 DIAGNOSIS — Z8673 Personal history of transient ischemic attack (TIA), and cerebral infarction without residual deficits: Secondary | ICD-10-CM

## 2021-07-19 DIAGNOSIS — E785 Hyperlipidemia, unspecified: Secondary | ICD-10-CM | POA: Diagnosis not present

## 2021-07-19 DIAGNOSIS — G894 Chronic pain syndrome: Secondary | ICD-10-CM | POA: Diagnosis present

## 2021-07-19 DIAGNOSIS — Z885 Allergy status to narcotic agent status: Secondary | ICD-10-CM | POA: Diagnosis not present

## 2021-07-19 DIAGNOSIS — G4700 Insomnia, unspecified: Secondary | ICD-10-CM | POA: Diagnosis not present

## 2021-07-19 DIAGNOSIS — R200 Anesthesia of skin: Secondary | ICD-10-CM | POA: Diagnosis not present

## 2021-07-19 DIAGNOSIS — F4310 Post-traumatic stress disorder, unspecified: Secondary | ICD-10-CM | POA: Diagnosis present

## 2021-07-19 DIAGNOSIS — Z91048 Other nonmedicinal substance allergy status: Secondary | ICD-10-CM

## 2021-07-19 DIAGNOSIS — F319 Bipolar disorder, unspecified: Secondary | ICD-10-CM | POA: Diagnosis not present

## 2021-07-19 DIAGNOSIS — I2510 Atherosclerotic heart disease of native coronary artery without angina pectoris: Secondary | ICD-10-CM | POA: Diagnosis present

## 2021-07-19 DIAGNOSIS — E1169 Type 2 diabetes mellitus with other specified complication: Secondary | ICD-10-CM | POA: Diagnosis not present

## 2021-07-19 DIAGNOSIS — E1140 Type 2 diabetes mellitus with diabetic neuropathy, unspecified: Secondary | ICD-10-CM | POA: Diagnosis present

## 2021-07-19 DIAGNOSIS — Z7982 Long term (current) use of aspirin: Secondary | ICD-10-CM

## 2021-07-19 DIAGNOSIS — T84033A Mechanical loosening of internal left knee prosthetic joint, initial encounter: Principal | ICD-10-CM | POA: Diagnosis present

## 2021-07-19 DIAGNOSIS — Z96659 Presence of unspecified artificial knee joint: Secondary | ICD-10-CM | POA: Diagnosis not present

## 2021-07-19 DIAGNOSIS — Z888 Allergy status to other drugs, medicaments and biological substances status: Secondary | ICD-10-CM

## 2021-07-19 DIAGNOSIS — Z471 Aftercare following joint replacement surgery: Secondary | ICD-10-CM | POA: Diagnosis not present

## 2021-07-19 DIAGNOSIS — J449 Chronic obstructive pulmonary disease, unspecified: Secondary | ICD-10-CM | POA: Diagnosis not present

## 2021-07-19 DIAGNOSIS — Z79899 Other long term (current) drug therapy: Secondary | ICD-10-CM

## 2021-07-19 DIAGNOSIS — T84033D Mechanical loosening of internal left knee prosthetic joint, subsequent encounter: Secondary | ICD-10-CM | POA: Diagnosis not present

## 2021-07-19 DIAGNOSIS — E1142 Type 2 diabetes mellitus with diabetic polyneuropathy: Secondary | ICD-10-CM

## 2021-07-19 DIAGNOSIS — Z8619 Personal history of other infectious and parasitic diseases: Secondary | ICD-10-CM

## 2021-07-19 DIAGNOSIS — I739 Peripheral vascular disease, unspecified: Secondary | ICD-10-CM

## 2021-07-19 DIAGNOSIS — M25559 Pain in unspecified hip: Secondary | ICD-10-CM | POA: Diagnosis not present

## 2021-07-19 DIAGNOSIS — Z96652 Presence of left artificial knee joint: Secondary | ICD-10-CM | POA: Diagnosis not present

## 2021-07-19 DIAGNOSIS — E782 Mixed hyperlipidemia: Secondary | ICD-10-CM

## 2021-07-19 HISTORY — PX: TOTAL KNEE REVISION: SHX996

## 2021-07-19 LAB — GLUCOSE, CAPILLARY
Glucose-Capillary: 132 mg/dL — ABNORMAL HIGH (ref 70–99)
Glucose-Capillary: 217 mg/dL — ABNORMAL HIGH (ref 70–99)
Glucose-Capillary: 188 mg/dL — ABNORMAL HIGH (ref 70–99)
Glucose-Capillary: 221 mg/dL — ABNORMAL HIGH (ref 70–99)

## 2021-07-19 LAB — URINE DRUG SCREEN, QUALITATIVE (ARMC ONLY)
Amphetamines, Ur Screen: NOT DETECTED
Barbiturates, Ur Screen: NOT DETECTED
Benzodiazepine, Ur Scrn: NOT DETECTED
Cannabinoid 50 Ng, Ur ~~LOC~~: POSITIVE — AB
Cocaine Metabolite,Ur ~~LOC~~: NOT DETECTED
MDMA (Ecstasy)Ur Screen: NOT DETECTED
Methadone Scn, Ur: NOT DETECTED
Opiate, Ur Screen: POSITIVE — AB
Phencyclidine (PCP) Ur S: NOT DETECTED
Tricyclic, Ur Screen: POSITIVE — AB

## 2021-07-19 SURGERY — TOTAL KNEE REVISION
Anesthesia: General | Site: Knee | Laterality: Left

## 2021-07-19 MED ORDER — FENTANYL CITRATE (PF) 100 MCG/2ML IJ SOLN
INTRAMUSCULAR | Status: AC
  Filled 2021-07-19: qty 2

## 2021-07-19 MED ORDER — CHLORHEXIDINE GLUCONATE 0.12 % MT SOLN
OROMUCOSAL | Status: AC
  Administered 2021-07-19: 15 mL via OROMUCOSAL
  Filled 2021-07-19: qty 15

## 2021-07-19 MED ORDER — TRANEXAMIC ACID-NACL 1000-0.7 MG/100ML-% IV SOLN
INTRAVENOUS | Status: AC
  Filled 2021-07-19: qty 100

## 2021-07-19 MED ORDER — MIDAZOLAM HCL 2 MG/2ML IJ SOLN
INTRAMUSCULAR | Status: DC | PRN
  Administered 2021-07-19: 4 mg via INTRAVENOUS

## 2021-07-19 MED ORDER — ONDANSETRON HCL 4 MG/2ML IJ SOLN: 4.0000 mg | Freq: Once | INTRAMUSCULAR | Status: DC | PRN

## 2021-07-19 MED ORDER — TERBINAFINE HCL 250 MG PO TABS
250.0000 mg | ORAL_TABLET | Freq: Every day | ORAL | Status: DC
  Administered 2021-07-20 – 2021-07-21 (×2): 250 mg via ORAL
  Filled 2021-07-19 (×2): qty 1

## 2021-07-19 MED ORDER — BUPIVACAINE HCL 0.25 % IJ SOLN
INTRAMUSCULAR | Status: DC | PRN
  Administered 2021-07-19: 60 mL

## 2021-07-19 MED ORDER — MENTHOL 3 MG MT LOZG: 1.0000 | LOZENGE | OROMUCOSAL | Status: DC | PRN

## 2021-07-19 MED ORDER — SODIUM CHLORIDE 0.9 % IR SOLN
Status: DC | PRN
  Administered 2021-07-19: 3000 mL

## 2021-07-19 MED ORDER — ACETAMINOPHEN 10 MG/ML IV SOLN
INTRAVENOUS | Status: AC
  Filled 2021-07-19: qty 100

## 2021-07-19 MED ORDER — MIDAZOLAM HCL 2 MG/2ML IJ SOLN
INTRAMUSCULAR | Status: AC
  Filled 2021-07-19: qty 2

## 2021-07-19 MED ORDER — DEXAMETHASONE SODIUM PHOSPHATE 10 MG/ML IJ SOLN
INTRAMUSCULAR | Status: AC
  Administered 2021-07-19: 8 mg via INTRAVENOUS
  Filled 2021-07-19: qty 1

## 2021-07-19 MED ORDER — METFORMIN HCL 500 MG PO TABS
500.0000 mg | ORAL_TABLET | Freq: Two times a day (BID) | ORAL | Status: DC
Start: 2021-07-20 — End: 2021-07-21
  Administered 2021-07-20 – 2021-07-21 (×3): 500 mg via ORAL
  Filled 2021-07-19 (×3): qty 1

## 2021-07-19 MED ORDER — CYCLOBENZAPRINE HCL 10 MG PO TABS
10.0000 mg | ORAL_TABLET | Freq: Two times a day (BID) | ORAL | Status: DC
  Administered 2021-07-19 – 2021-07-21 (×4): 10 mg via ORAL
  Filled 2021-07-19 (×4): qty 1

## 2021-07-19 MED ORDER — OXYCODONE HCL 5 MG/5ML PO SOLN: 5.0000 mg | Freq: Once | ORAL | Status: AC | PRN

## 2021-07-19 MED ORDER — ROCURONIUM BROMIDE 100 MG/10ML IV SOLN
INTRAVENOUS | Status: DC | PRN
  Administered 2021-07-19 (×2): 50 mg via INTRAVENOUS
  Administered 2021-07-19 (×2): 30 mg via INTRAVENOUS
  Administered 2021-07-19: 20 mg via INTRAVENOUS
  Administered 2021-07-19: 50 mg via INTRAVENOUS

## 2021-07-19 MED ORDER — HYDROMORPHONE HCL 1 MG/ML IJ SOLN
INTRAMUSCULAR | Status: AC
  Filled 2021-07-19: qty 1

## 2021-07-19 MED ORDER — FERROUS SULFATE 325 (65 FE) MG PO TABS
325.0000 mg | ORAL_TABLET | Freq: Two times a day (BID) | ORAL | Status: DC
  Administered 2021-07-20 (×2): 325 mg via ORAL
  Filled 2021-07-19 (×3): qty 1

## 2021-07-19 MED ORDER — OXYCODONE HCL 5 MG PO TABS
10.0000 mg | ORAL_TABLET | ORAL | Status: DC | PRN
  Administered 2021-07-20 – 2021-07-21 (×5): 10 mg via ORAL
  Filled 2021-07-19 (×5): qty 2

## 2021-07-19 MED ORDER — ACETAMINOPHEN 10 MG/ML IV SOLN
1000.0000 mg | Freq: Four times a day (QID) | INTRAVENOUS | Status: AC
  Administered 2021-07-19 – 2021-07-20 (×4): 1000 mg via INTRAVENOUS
  Filled 2021-07-19 (×4): qty 100

## 2021-07-19 MED ORDER — KETAMINE HCL 50 MG/5ML IJ SOSY
PREFILLED_SYRINGE | INTRAMUSCULAR | Status: AC
Start: 2021-07-19 — End: ?
  Filled 2021-07-19: qty 5

## 2021-07-19 MED ORDER — MAGNESIUM HYDROXIDE 400 MG/5ML PO SUSP
30.0000 mL | Freq: Every day | ORAL | Status: DC
  Administered 2021-07-19 – 2021-07-20 (×2): 30 mL via ORAL
  Filled 2021-07-19 (×3): qty 30

## 2021-07-19 MED ORDER — PHENYLEPHRINE HCL-NACL 20-0.9 MG/250ML-% IV SOLN
INTRAVENOUS | Status: DC | PRN
  Administered 2021-07-19: 25 ug/min via INTRAVENOUS

## 2021-07-19 MED ORDER — GABAPENTIN 300 MG PO CAPS
ORAL_CAPSULE | ORAL | Status: AC
  Administered 2021-07-19: 300 mg via ORAL
  Filled 2021-07-19: qty 1

## 2021-07-19 MED ORDER — OXYCODONE HCL 5 MG PO TABS
5.0000 mg | ORAL_TABLET | Freq: Once | ORAL | Status: AC | PRN
  Administered 2021-07-19: 5 mg via ORAL

## 2021-07-19 MED ORDER — PREGABALIN 75 MG PO CAPS
200.0000 mg | ORAL_CAPSULE | Freq: Three times a day (TID) | ORAL | Status: DC
  Administered 2021-07-19 – 2021-07-21 (×5): 200 mg via ORAL
  Filled 2021-07-19 (×5): qty 1

## 2021-07-19 MED ORDER — SENNOSIDES-DOCUSATE SODIUM 8.6-50 MG PO TABS
1.0000 | ORAL_TABLET | Freq: Two times a day (BID) | ORAL | Status: DC
  Administered 2021-07-19 – 2021-07-20 (×3): 1 via ORAL
  Filled 2021-07-19 (×4): qty 1

## 2021-07-19 MED ORDER — OXYCODONE HCL 5 MG PO TABS
10.0000 mg | ORAL_TABLET | Freq: Four times a day (QID) | ORAL | Status: DC
  Administered 2021-07-19 – 2021-07-21 (×6): 10 mg via ORAL
  Filled 2021-07-19 (×7): qty 2

## 2021-07-19 MED ORDER — BISACODYL 10 MG RE SUPP
10.0000 mg | Freq: Every day | RECTAL | Status: DC | PRN
  Administered 2021-07-21: 10 mg via RECTAL
  Filled 2021-07-19: qty 1

## 2021-07-19 MED ORDER — ADULT MULTIVITAMIN W/MINERALS CH
1.0000 | ORAL_TABLET | Freq: Every morning | ORAL | Status: DC
  Administered 2021-07-20 – 2021-07-21 (×2): 1 via ORAL
  Filled 2021-07-19 (×2): qty 1

## 2021-07-19 MED ORDER — PANTOPRAZOLE SODIUM 40 MG PO TBEC
40.0000 mg | DELAYED_RELEASE_TABLET | Freq: Two times a day (BID) | ORAL | Status: DC
  Administered 2021-07-19 – 2021-07-21 (×4): 40 mg via ORAL
  Filled 2021-07-19 (×4): qty 1

## 2021-07-19 MED ORDER — FENTANYL CITRATE (PF) 100 MCG/2ML IJ SOLN
INTRAMUSCULAR | Status: DC | PRN
  Administered 2021-07-19: 50 ug via INTRAVENOUS
  Administered 2021-07-19: 100 ug via INTRAVENOUS
  Administered 2021-07-19: 50 ug via INTRAVENOUS
  Administered 2021-07-19: 100 ug via INTRAVENOUS
  Administered 2021-07-19 (×2): 50 ug via INTRAVENOUS

## 2021-07-19 MED ORDER — SURGIRINSE WOUND IRRIGATION SYSTEM - OPTIME
TOPICAL | Status: DC | PRN
  Administered 2021-07-19: 450 mL via TOPICAL

## 2021-07-19 MED ORDER — ZOLPIDEM TARTRATE 5 MG PO TABS
5.0000 mg | ORAL_TABLET | Freq: Every evening | ORAL | Status: DC | PRN
Start: 2021-07-19 — End: 2021-07-21
  Administered 2021-07-20: 5 mg via ORAL
  Filled 2021-07-19: qty 1

## 2021-07-19 MED ORDER — PROPOFOL 10 MG/ML IV BOLUS
INTRAVENOUS | Status: DC | PRN
  Administered 2021-07-19: 200 mg via INTRAVENOUS

## 2021-07-19 MED ORDER — OXYCODONE HCL 5 MG PO TABS: 5.0000 mg | ORAL_TABLET | ORAL | Status: DC | PRN

## 2021-07-19 MED ORDER — LIDOCAINE HCL (CARDIAC) PF 100 MG/5ML IV SOSY
PREFILLED_SYRINGE | INTRAVENOUS | Status: DC | PRN
  Administered 2021-07-19: 100 mg via INTRAVENOUS

## 2021-07-19 MED ORDER — TRANEXAMIC ACID-NACL 1000-0.7 MG/100ML-% IV SOLN: 1000.0000 mg | Freq: Once | INTRAVENOUS | Status: DC

## 2021-07-19 MED ORDER — TOBRAMYCIN SULFATE 1.2 G IJ SOLR
INTRAMUSCULAR | Status: AC
  Filled 2021-07-19: qty 1.2

## 2021-07-19 MED ORDER — ONDANSETRON HCL 4 MG/2ML IJ SOLN: 4.0000 mg | Freq: Four times a day (QID) | INTRAMUSCULAR | Status: DC | PRN

## 2021-07-19 MED ORDER — INSULIN ASPART 100 UNIT/ML IJ SOLN
INTRAMUSCULAR | Status: AC
  Filled 2021-07-19: qty 1

## 2021-07-19 MED ORDER — MIDAZOLAM HCL 2 MG/2ML IJ SOLN
INTRAMUSCULAR | Status: AC
Start: 2021-07-19 — End: ?
  Filled 2021-07-19: qty 2

## 2021-07-19 MED ORDER — DEXMEDETOMIDINE HCL IN NACL 200 MCG/50ML IV SOLN
INTRAVENOUS | Status: DC | PRN
  Administered 2021-07-19: .4 ug/kg/h via INTRAVENOUS

## 2021-07-19 MED ORDER — INSULIN ASPART 100 UNIT/ML IJ SOLN
0.0000 [IU] | Freq: Every day | INTRAMUSCULAR | Status: DC
  Administered 2021-07-19: 2 [IU] via SUBCUTANEOUS
  Filled 2021-07-19: qty 1

## 2021-07-19 MED ORDER — 0.9 % SODIUM CHLORIDE (POUR BTL) OPTIME
TOPICAL | Status: DC | PRN
  Administered 2021-07-19: 500 mL

## 2021-07-19 MED ORDER — FENTANYL CITRATE (PF) 100 MCG/2ML IJ SOLN
25.0000 ug | INTRAMUSCULAR | Status: DC | PRN
  Administered 2021-07-19 (×3): 50 ug via INTRAVENOUS

## 2021-07-19 MED ORDER — CEFAZOLIN SODIUM-DEXTROSE 2-4 GM/100ML-% IV SOLN
INTRAVENOUS | Status: AC
  Filled 2021-07-19: qty 100

## 2021-07-19 MED ORDER — METOCLOPRAMIDE HCL 5 MG PO TABS
10.0000 mg | ORAL_TABLET | Freq: Three times a day (TID) | ORAL | Status: DC
  Administered 2021-07-19 – 2021-07-21 (×6): 10 mg via ORAL
  Filled 2021-07-19 (×6): qty 2

## 2021-07-19 MED ORDER — FLEET ENEMA 7-19 GM/118ML RE ENEM: 1.0000 | ENEMA | Freq: Once | RECTAL | Status: DC | PRN

## 2021-07-19 MED ORDER — ACETAMINOPHEN 325 MG PO TABS: 325.0000 mg | ORAL_TABLET | Freq: Four times a day (QID) | ORAL | Status: DC | PRN

## 2021-07-19 MED ORDER — ACETAMINOPHEN 10 MG/ML IV SOLN
INTRAVENOUS | Status: DC | PRN
  Administered 2021-07-19: 1000 mg via INTRAVENOUS

## 2021-07-19 MED ORDER — KETAMINE HCL 10 MG/ML IJ SOLN
INTRAMUSCULAR | Status: DC | PRN
  Administered 2021-07-19: 10 mg via INTRAVENOUS
  Administered 2021-07-19 (×2): 20 mg via INTRAVENOUS

## 2021-07-19 MED ORDER — SODIUM CHLORIDE 0.9 % IV SOLN
INTRAVENOUS | Status: DC | PRN
  Administered 2021-07-19: 60 mL

## 2021-07-19 MED ORDER — OXYCODONE HCL 5 MG PO TABS
ORAL_TABLET | ORAL | Status: AC
  Filled 2021-07-19: qty 1

## 2021-07-19 MED ORDER — PHENOL 1.4 % MT LIQD: 1.0000 | OROMUCOSAL | Status: DC | PRN

## 2021-07-19 MED ORDER — HYDROMORPHONE HCL 1 MG/ML IJ SOLN
0.5000 mg | INTRAMUSCULAR | Status: DC | PRN
  Administered 2021-07-19: 0.5 mg via INTRAVENOUS

## 2021-07-19 MED ORDER — ATORVASTATIN CALCIUM 20 MG PO TABS
20.0000 mg | ORAL_TABLET | Freq: Every day | ORAL | Status: DC
  Administered 2021-07-19 – 2021-07-20 (×2): 20 mg via ORAL
  Filled 2021-07-19 (×2): qty 1

## 2021-07-19 MED ORDER — INSULIN ASPART 100 UNIT/ML IJ SOLN
5.0000 [IU] | Freq: Once | INTRAMUSCULAR | Status: AC
  Administered 2021-07-19: 5 [IU] via SUBCUTANEOUS

## 2021-07-19 MED ORDER — HYDROMORPHONE HCL 1 MG/ML IJ SOLN
INTRAMUSCULAR | Status: DC | PRN
  Administered 2021-07-19: .5 mg via INTRAVENOUS
  Administered 2021-07-19 (×2): 1 mg via INTRAVENOUS
  Administered 2021-07-19: .5 mg via INTRAVENOUS

## 2021-07-19 MED ORDER — PHENYLEPHRINE 80 MCG/ML (10ML) SYRINGE FOR IV PUSH (FOR BLOOD PRESSURE SUPPORT)
PREFILLED_SYRINGE | INTRAVENOUS | Status: DC | PRN
  Administered 2021-07-19: 80 ug via INTRAVENOUS

## 2021-07-19 MED ORDER — DEXMEDETOMIDINE (PRECEDEX) IN NS 20 MCG/5ML (4 MCG/ML) IV SYRINGE
PREFILLED_SYRINGE | INTRAVENOUS | Status: DC | PRN
  Administered 2021-07-19 (×2): 8 ug via INTRAVENOUS
  Administered 2021-07-19: 4 ug via INTRAVENOUS

## 2021-07-19 MED ORDER — DIPHENHYDRAMINE HCL 12.5 MG/5ML PO ELIX: 12.5000 mg | ORAL_SOLUTION | ORAL | Status: DC | PRN

## 2021-07-19 MED ORDER — HYDROMORPHONE HCL 1 MG/ML IJ SOLN
0.5000 mg | INTRAMUSCULAR | Status: DC | PRN
  Administered 2021-07-19 – 2021-07-20 (×4): 1 mg via INTRAVENOUS
  Filled 2021-07-19 (×4): qty 1

## 2021-07-19 MED ORDER — ESMOLOL HCL 100 MG/10ML IV SOLN
INTRAVENOUS | Status: DC | PRN
  Administered 2021-07-19: 20 mg via INTRAVENOUS

## 2021-07-19 MED ORDER — ONDANSETRON HCL 4 MG/2ML IJ SOLN
INTRAMUSCULAR | Status: DC | PRN
  Administered 2021-07-19: 4 mg via INTRAVENOUS

## 2021-07-19 MED ORDER — INSULIN ASPART 100 UNIT/ML IJ SOLN
0.0000 [IU] | Freq: Three times a day (TID) | INTRAMUSCULAR | Status: DC
  Administered 2021-07-20 (×2): 3 [IU] via SUBCUTANEOUS
  Administered 2021-07-21: 2 [IU] via SUBCUTANEOUS
  Filled 2021-07-19 (×3): qty 1

## 2021-07-19 MED ORDER — CELECOXIB 200 MG PO CAPS
ORAL_CAPSULE | ORAL | Status: AC
  Administered 2021-07-19: 400 mg via ORAL
  Filled 2021-07-19: qty 2

## 2021-07-19 MED ORDER — ALUM & MAG HYDROXIDE-SIMETH 200-200-20 MG/5ML PO SUSP: 30.0000 mL | ORAL | Status: DC | PRN

## 2021-07-19 MED ORDER — CELECOXIB 200 MG PO CAPS
200.0000 mg | ORAL_CAPSULE | Freq: Two times a day (BID) | ORAL | Status: DC
  Administered 2021-07-19 – 2021-07-21 (×4): 200 mg via ORAL
  Filled 2021-07-19 (×4): qty 1

## 2021-07-19 MED ORDER — LIDOCAINE 5 % EX PTCH
1.0000 | MEDICATED_PATCH | CUTANEOUS | Status: DC
  Administered 2021-07-19 – 2021-07-20 (×2): 1 via TRANSDERMAL
  Filled 2021-07-19 (×2): qty 1

## 2021-07-19 MED ORDER — ACETAMINOPHEN 10 MG/ML IV SOLN
1000.0000 mg | Freq: Once | INTRAVENOUS | Status: DC | PRN
Start: 2021-07-19 — End: 2021-07-19

## 2021-07-19 MED ORDER — SODIUM CHLORIDE 0.9 % IV SOLN: INTRAVENOUS | Status: DC

## 2021-07-19 MED ORDER — ENOXAPARIN SODIUM 30 MG/0.3ML IJ SOSY
30.0000 mg | PREFILLED_SYRINGE | Freq: Two times a day (BID) | INTRAMUSCULAR | Status: DC
  Administered 2021-07-20 – 2021-07-21 (×3): 30 mg via SUBCUTANEOUS
  Filled 2021-07-19 (×3): qty 0.3

## 2021-07-19 MED ORDER — SUGAMMADEX SODIUM 200 MG/2ML IV SOLN
INTRAVENOUS | Status: DC | PRN
  Administered 2021-07-19: 250 mg via INTRAVENOUS

## 2021-07-19 MED ORDER — LACTATED RINGERS IV SOLN: INTRAVENOUS | Status: DC | PRN

## 2021-07-19 MED ORDER — NEOMYCIN-POLYMYXIN B GU 40-200000 IR SOLN
Status: DC | PRN
  Administered 2021-07-19: 14 mL

## 2021-07-19 MED ORDER — ONDANSETRON HCL 4 MG PO TABS: 4.0000 mg | ORAL_TABLET | Freq: Four times a day (QID) | ORAL | Status: DC | PRN

## 2021-07-19 MED ORDER — CEFAZOLIN SODIUM-DEXTROSE 2-4 GM/100ML-% IV SOLN
2.0000 g | Freq: Four times a day (QID) | INTRAVENOUS | Status: AC
  Administered 2021-07-19 – 2021-07-20 (×2): 2 g via INTRAVENOUS
  Filled 2021-07-19 (×2): qty 100

## 2021-07-19 SURGICAL SUPPLY — 92 items
ADAPTER BOLT FEMORAL +2/-2 (Knees) ×1 IMPLANT
AUGMENT FMRL PST PFC SIG SZ4 8 (Orthopedic Implant) IMPLANT
BLADE OSCILLATING/SAGITTAL (BLADE) ×1
BLADE SAGITTAL 25.0X1.19X90 (BLADE) ×2 IMPLANT
BLADE SAW 70X12.5 (BLADE) ×2 IMPLANT
BLADE SAW 90X13X1.19 OSCILLAT (BLADE) ×2 IMPLANT
BLADE SAW 90X25X1.19 OSCILLAT (BLADE) ×2 IMPLANT
BLADE SAW SGTL 13X75X1.27 (BLADE) ×2 IMPLANT
BLADE SW THK.38XMED LNG THN (BLADE) ×1 IMPLANT
BONE CEMENT GENTAMICIN (Cement) ×4 IMPLANT
CEMENT BONE GENTAMICIN 40 (Cement) ×2 IMPLANT
CNTNR SPEC 2.5X3XGRAD LEK (MISCELLANEOUS) ×4
COMP FEM CEM LT SZ4 (Orthopedic Implant) ×2 IMPLANT
COMPONENT FEM CEM LT SZ4 (Orthopedic Implant) IMPLANT
CONT SPEC 4OZ STER OR WHT (MISCELLANEOUS) ×4
CONTAINER SPEC 2.5X3XGRAD LEK (MISCELLANEOUS) ×4 IMPLANT
COOLER POLAR GLACIER W/PUMP (MISCELLANEOUS) ×2 IMPLANT
COVER BACK TABLE REUSABLE LG (DRAPES) ×2 IMPLANT
CUFF TOURN SGL QUICK 24 (TOURNIQUET CUFF)
CUFF TOURN SGL QUICK 34 (TOURNIQUET CUFF)
CUFF TRNQT CYL 24X4X16.5-23 (TOURNIQUET CUFF) IMPLANT
CUFF TRNQT CYL 34X4.125X (TOURNIQUET CUFF) IMPLANT
DRAPE 3/4 80X56 (DRAPES) ×4 IMPLANT
DRSG DERMACEA 8X12 NADH (GAUZE/BANDAGES/DRESSINGS) ×1 IMPLANT
DRSG DERMACEA NONADH 3X8 (GAUZE/BANDAGES/DRESSINGS) ×2 IMPLANT
DRSG MEPILEX SACRM 8.7X9.8 (GAUZE/BANDAGES/DRESSINGS) ×2 IMPLANT
DRSG OPSITE POSTOP 4X14 (GAUZE/BANDAGES/DRESSINGS) ×2 IMPLANT
DRSG TEGADERM 4X4.75 (GAUZE/BANDAGES/DRESSINGS) ×2 IMPLANT
DURAPREP 26ML APPLICATOR (WOUND CARE) ×4 IMPLANT
ELECT CAUTERY BLADE 6.4 (BLADE) ×2 IMPLANT
ELECT REM PT RETURN 9FT ADLT (ELECTROSURGICAL) ×2
ELECTRODE REM PT RTRN 9FT ADLT (ELECTROSURGICAL) ×1 IMPLANT
FEM POST AUG PFC SIGMA SZ4 8MM (Orthopedic Implant) ×4 IMPLANT
FEMORAL ADAPTER (Orthopedic Implant) ×1 IMPLANT
GAUZE SPONGE 4X4 12PLY STRL (GAUZE/BANDAGES/DRESSINGS) ×2 IMPLANT
GLOVE BIOGEL M STRL SZ7.5 (GLOVE) ×10 IMPLANT
GLOVE BIOGEL PI IND STRL 8 (GLOVE) ×1 IMPLANT
GLOVE BIOGEL PI INDICATOR 8 (GLOVE) ×1
GLOVE SURG UNDER LTX SZ8 (GLOVE) ×2 IMPLANT
GLOVE SURG UNDER POLY LF SZ7.5 (GLOVE) ×2 IMPLANT
GOWN STRL REUS W/ TWL LRG LVL3 (GOWN DISPOSABLE) ×2 IMPLANT
GOWN STRL REUS W/TWL LRG LVL3 (GOWN DISPOSABLE) ×2
HANDPIECE VERSAJET DEBRIDEMENT (MISCELLANEOUS) ×2 IMPLANT
HEMOVAC 400CC 10FR (MISCELLANEOUS) ×2 IMPLANT
HOLDER FOLEY CATH W/STRAP (MISCELLANEOUS) ×2 IMPLANT
HOOD PEEL AWAY FLYTE STAYCOOL (MISCELLANEOUS) ×4 IMPLANT
INSERT RP TC3 4 10MM (Knees) IMPLANT
INSET RP TC3 4 10MM (Knees) ×2 IMPLANT
IV NS IRRIG 3000ML ARTHROMATIC (IV SOLUTION) ×2 IMPLANT
KIT TURNOVER KIT A (KITS) ×2 IMPLANT
KNIFE SCULPS 14X20 (INSTRUMENTS) ×2 IMPLANT
MANIFOLD NEPTUNE II (INSTRUMENTS) ×4 IMPLANT
NDL SAFETY ECLIPSE 18X1.5 (NEEDLE) ×1 IMPLANT
NDL SPNL 18GX3.5 QUINCKE PK (NEEDLE) ×1 IMPLANT
NDL SPNL 20GX3.5 QUINCKE YW (NEEDLE) ×2 IMPLANT
NEEDLE HYPO 18GX1.5 SHARP (NEEDLE) ×1
NEEDLE SPNL 18GX3.5 QUINCKE PK (NEEDLE) ×2 IMPLANT
NEEDLE SPNL 20GX3.5 QUINCKE YW (NEEDLE) ×4 IMPLANT
NS IRRIG 1000ML POUR BTL (IV SOLUTION) ×2 IMPLANT
PACK TOTAL KNEE (MISCELLANEOUS) ×2 IMPLANT
PAD ABD DERMACEA PRESS 5X9 (GAUZE/BANDAGES/DRESSINGS) ×2 IMPLANT
PAD WRAPON POLAR KNEE (MISCELLANEOUS) ×1 IMPLANT
PENCIL SMOKE EVACUATOR COATED (MISCELLANEOUS) ×2 IMPLANT
PIN FIXATION 1/8DIA X 3INL (PIN) ×1 IMPLANT
PULSAVAC PLUS IRRIG FAN TIP (DISPOSABLE) ×2
SLEEVE UNIV FEM DIST PRO SZ 31 (Sleeve) ×1 IMPLANT
SOL PREP PVP 2OZ (MISCELLANEOUS) ×2
SOLUTION PREP PVP 2OZ (MISCELLANEOUS) ×1 IMPLANT
SOLUTION PRONTOSAN WOUND 350ML (IRRIGATION / IRRIGATOR) ×2 IMPLANT
SPONGE DRAIN TRACH 4X4 STRL 2S (GAUZE/BANDAGES/DRESSINGS) ×2 IMPLANT
STAPLER SKIN PROX 35W (STAPLE) ×2 IMPLANT
STEM UNIVERSAL REVISION 75X16 (Stem) ×1 IMPLANT
SUCTION FRAZIER HANDLE 10FR (MISCELLANEOUS) ×2
SUCTION TUBE FRAZIER 10FR DISP (MISCELLANEOUS) ×1 IMPLANT
SUT MNCRL 0 1X36 CT-1 (SUTURE) ×1 IMPLANT
SUT MON AB 2-0 CT1 36 (SUTURE) ×2 IMPLANT
SUT MONOCRYL 0 (SUTURE) ×1
SUT PROLENE 1 CT 1 30 (SUTURE) ×2 IMPLANT
SUT VIC AB 0 CT1 36 (SUTURE) ×2 IMPLANT
SUT VIC AB 1 CT1 36 (SUTURE) ×4 IMPLANT
SUT VIC AB 2-0 CT1 (SUTURE) ×2 IMPLANT
SWAB CULTURE AMIES ANAERIB BLU (MISCELLANEOUS) ×14 IMPLANT
SYR 20ML LL LF (SYRINGE) ×2 IMPLANT
SYR 30ML LL (SYRINGE) ×4 IMPLANT
TIP BRUSH PULSAVAC PLUS 24.33 (MISCELLANEOUS) ×2 IMPLANT
TIP FAN IRRIG PULSAVAC PLUS (DISPOSABLE) ×1 IMPLANT
TOWEL OR 17X26 4PK STRL BLUE (TOWEL DISPOSABLE) ×2 IMPLANT
TOWER CARTRIDGE SMART MIX (DISPOSABLE) ×2 IMPLANT
TRAY FOLEY MTR SLVR 16FR STAT (SET/KITS/TRAYS/PACK) ×2 IMPLANT
TUBING CONNECTING 10 (TUBING) ×1 IMPLANT
WATER STERILE IRR 500ML POUR (IV SOLUTION) ×2 IMPLANT
WRAPON POLAR PAD KNEE (MISCELLANEOUS) ×2

## 2021-07-19 NOTE — Transfer of Care (Signed)
Immediate Anesthesia Transfer of Care Note  Patient: Erik Martinez  Procedure(s) Performed: TOTAL KNEE REVISION (Left: Knee)  Patient Location: PACU  Anesthesia Type:General  Level of Consciousness: sedated  Airway & Oxygen Therapy: Patient Spontanous Breathing and Patient connected to face mask oxygen  Post-op Assessment: Report given to RN and Post -op Vital signs reviewed and stable  Post vital signs: Reviewed and stable  Last Vitals:  Vitals Value Taken Time  BP 121/63 07/19/21 1834  Temp    Pulse 95 07/19/21 1835  Resp 15 07/19/21 1835  SpO2 93 % 07/19/21 1835  Vitals shown include unvalidated device data.  Last Pain:  Vitals:   07/19/21 1018  TempSrc: Oral  PainSc: 8          Complications: No notable events documented.

## 2021-07-19 NOTE — Anesthesia Procedure Notes (Addendum)
Procedure Name: Intubation Date/Time: 07/19/2021 1:00 PM  Performed by: Nelda Marseille, CRNAPre-anesthesia Checklist: Patient identified, Patient being monitored, Timeout performed, Emergency Drugs available and Suction available Patient Re-evaluated:Patient Re-evaluated prior to induction Oxygen Delivery Method: Circle system utilized Preoxygenation: Pre-oxygenation with 100% oxygen Induction Type: IV induction Ventilation: Mask ventilation without difficulty Laryngoscope Size: Mac, 3 and McGraph Grade View: Grade I Tube type: Oral Tube size: 7.5 mm Number of attempts: 1 Airway Equipment and Method: Stylet Placement Confirmation: ETT inserted through vocal cords under direct vision, positive ETCO2 and breath sounds checked- equal and bilateral Secured at: 23 cm Tube secured with: Tape Dental Injury: Teeth and Oropharynx as per pre-operative assessment

## 2021-07-19 NOTE — TOC Progression Note (Signed)
Transition of Care [facility]) - Progression Note    Patient Details  Name: Erik Martinez MRN: [id number] Date of Birth: September 09, 1955  Transition of Care [facility]) CM/SW [place], RN Phone Number: 07/19/2021, 11:25 AM  Clinical Narrative:    [facility] accepted the patient for [facility] services prior to Surgery       Expected Discharge Plan and Services                                                 Social Determinants of Health (SDOH) Interventions    Readmission Risk Interventions     No data to display

## 2021-07-19 NOTE — Op Note (Addendum)
OPERATIVE NOTE  DATE OF SURGERY:  07/19/2021  PATIENT NAME:  Erik Martinez   DOB: 1955/02/03  MRN: [id number]  PRE-OPERATIVE DIAGNOSIS:  Loose implant status post left total knee revision arthroplasty Arthrofibrosis of the left knee  POST-OPERATIVE DIAGNOSIS:  Same  PROCEDURE:  Left knee revision arthroplasty (femoral and polyethylene components) Extensive synovectomy and excision of fibrotic tissue from the left knee  SURGEON:  Marciano Sequin. M.D.  ASSISTANT: Cassell Smiles, PA-C (present and scrubbed throughout the case, critical for assistance with exposure, retraction, instrumentation, and closure)  ANESTHESIA: general  ESTIMATED BLOOD LOSS: 100 mL  FLUIDS REPLACED: 1800 mL of crystalloid  TOURNIQUET TIME: #1 - 120 minutes #2 = 30 minutes  DRAINS: 2 medium Hemovac drains   IMPLANTS UTILIZED: DePuy Sigma TC3 size 4 femoral component (cemented), 5 degree Sigma femoral adapter, femoral adapter bolt, 31 mm fully porous-coated femoral sleeve, 75 mm x 16 mm fluted stem, 8 mm posterior augments (medial and lateral),  and a 10 mm stabilized rotating platform TC3 polyethylene insert.  INDICATIONS FOR SURGERY: Erik Martinez is a 66 y.o. year old male with a complicated history involving his left knee.  He initially underwent a medial unicompartmental knee arthroplasty followed by a conversion to a total knee arthroplasty and subsequently a total knee revision arthroplasty.  The patient has had persistent pain to the left knee as well as significant restriction with his knee range of motion..  Three-phase bone scans demonstrated evidence suggestive of loosening of the femoral component.  After discussion of the risks and benefits of surgical intervention, the patient expressed understanding of the risks benefits and agree with plans for revision knee arthroplasty.   The risks, benefits, and alternatives were discussed at length including but not limited to the risks of infection,  bleeding, nerve injury, stiffness, blood clots, the need for revision surgery, cardiopulmonary complications, among others, and they were willing to proceed.  PROCEDURE IN DETAIL: The patient was brought into the operating room and, after adequate general anesthesia was achieved, a tourniquet was placed on the patient's upper thigh. The patient's knee and leg were cleaned and prepped with alcohol and DuraPrep and draped in the usual sterile fashion. A "timeout" was performed as per usual protocol. The lower extremity was exsanguinated using an Esmarch, and the tourniquet was inflated to 300 mmHg. An anterior longitudinal incision was made in line with the previous surgical scar followed by a standard medial parapatellar approach.  Swabs were obtained and submitted for stat Gram stain and cultures.  Dense fibrotic tissue was encountered with limited ability to mobilize the patella.  The deep fibers of the medial collateral ligament were elevated in a subperiosteal fashion off of the medial flare of the tibia so as to maintain a continuous soft tissue sleeve.  The dense fibrotic tissue was systematically excised using electrocautery beginning along the medial gutter and extended into the suprapatellar pouch and again down the lateral gutter a large fibrotic mass was encountered in the notch along the infrapatellar region and this was also excised using electrocautery.  Dense tissue around the patellar component was debrided.  This allowed for sufficient mobilization of the patella to allow for improved flexion and subluxation of the patella laterally.  The polyethylene component was removed to allow for inspection of the posterior recesses.  Again, extensive fibrotic tissue was encountered and this was debrided using electrocautery and rongeurs.  There was evidence of disruption along the cement bone interface of the femoral component.  The interface was debrided of soft tissue using curettes and rongeurs.  TPS saw  and thin flexible osteotomes were used to disrupt the implant cement interface.  This process continued from the anterior flange around both the medial and lateral aspect and eventually posteriorly.  A femoral extractor was attached to the femoral component and the femoral component was removed without difficulty.  The tourniquet was deflated after initial tourniquet time of 120 minutes.  Swabs were obtained of the femoral canal and submitted for culture and Gram stain.  The interface between the tibial component and the bone was inspected around its boundary.  There was no evidence of disruption or loosening to the tibial component.  The canal was cleaned of soft tissue using backbiting scrapers.  The femoral canal was then reamed in a sequential fashion up to a 16 mm diameter.  Good interference fit was noted.  Next, the conical reamer was used to open the distal aspect followed by insertion of the 31 mm broach.  Excellent fit was achieved.  The lateral rod was attached to the broach and the distal femoral cut was performed.  It was felt that a size 4 femoral implant was appropriate.  The size 4 cutting guide was placed in it was felt that 8 mm posterior augments were necessary both medially and laterally.  The necessary cuts were used to freshen the posterior condyles followed by the anterior and chamfer cuts.  Finally, the cutting guide for the central  box was performed.  The trial femoral construct was then positioned and impacted into place.  Reduction was performed using a 10 mm polyethylene trial.  This allowed for full extension and greater than 120 degrees of flexion.  Good patellar tracking was noted.  The left lower extremity was exsanguinated a second time using a #and the tourniquet reinflated to 300 mmHg.  The femoral trial was removed.  The actual femoral component was constructed on the back table and consisted of a size 4 Sigma TC 3 femoral component, 5 degree femoral adapter and bolt, 31 mm fully  porous-coated femoral sleeve, 75 mm x 16 mm universal fluted stem, and 8 mm posterior augments (medial and lateral).  The cut surfaces of bone were irrigated with copious amounts of normal saline with antibiotic solution using pulsatile lavage and suctioned dry. Polymethylmethacrylate cement with gentamicin was prepared in the usual fashion using a vacuum mixer. Cement was applied to the cut surfaces of the femur as well as along the posterior flanges of the size 4 femoral component. The femoral component was positioned and impacted into place. Excess cement was removed using [employer] fast streamer. A 10 mm polyethylene trial was inserted and the knee was brought into full extension with steady axial compression applied.  After adequate curing of the cement, the tourniquet was deflated after a second tourniquet time of 30 minutes. Hemostasis was achieved using electrocautery. The knee was irrigated with copious amounts of normal saline using pulsatile lavage followed by 450 ml of Surgiphor and then suctioned dry. 20 mL of 1.3% Exparel and 60 mL of 0.25% Marcaine in 40 mL of normal saline was injected along the posterior capsule, medial and lateral gutters, and along the arthrotomy site. A 10 mm stabilized rotating platform TC3 polyethylene insert was inserted and the knee was placed through a range of motion with excellent mediolateral soft tissue balancing appreciated and excellent patellar tracking noted. 2 medium drains were placed in the wound bed and brought out through separate stab incisions. The  medial parapatellar portion of the incision was reapproximated using interrupted sutures of #1 Vicryl. Subcutaneous tissue was approximated in layers using first #0 Vicryl followed #2-0 Vicryl. The skin was approximated with skin staples. A sterile dressing was applied.  The patient tolerated the procedure well and was transported to the recovery room in stable condition.    Natilee Gauer P. Holley Bouche., M.D.

## 2021-07-19 NOTE — OR Nursing (Signed)
Dr Marry Guan explanted from left knee  Left Sigma femur, Size 4/40m Posterior Augments, 4/162mrotating platform insert,and a75 x 1651mress fit stem

## 2021-07-19 NOTE — Anesthesia Preprocedure Evaluation (Signed)
Anesthesia Evaluation  Patient identified by MRN, date of birth, ID band Patient awake    Reviewed: Allergy & Precautions, NPO status , Patient's Chart, lab work & pertinent test results  History of Anesthesia Complications (+) PONV, POST - OP SPINAL HEADACHE and history of anesthetic complications  Airway Mallampati: II  TM Distance: >3 FB Neck ROM: Full    Dental  (+) Upper Dentures, Lower Dentures   Pulmonary neg pulmonary ROS, neg sleep apnea, neg COPD, Patient abstained from smoking.Not current smoker,    Pulmonary exam normal breath sounds clear to auscultation       Cardiovascular Exercise Tolerance: Good METS(-) hypertension+ Peripheral Vascular Disease  (-) CAD and (-) Past MI (-) dysrhythmias  Rhythm:Regular Rate:Normal - Systolic murmurs    Neuro/Psych  Headaches, PSYCHIATRIC DISORDERS Anxiety Depression Bipolar Disorder  Neuromuscular disease    GI/Hepatic GERD  Controlled,(+)     (-) substance abuse  , Hepatitis -, C  Endo/Other  diabetes, Well Controlled, Type 2Ozempic, last taken one week ago  Renal/GU negative Renal ROS     Musculoskeletal  (+) Arthritis , On chronic opioids, about '70mg'$  daily oxycodone   Abdominal   Peds  Hematology   Anesthesia Other Findings Past Medical History: No date: Anxiety No date: Arthritis No date: Avascular necrosis of bones of both hips (HCC) No date: Bipolar disorder (HCC) No date: Bladder spasms No date: Chronic pain of left knee No date: Chronic pain syndrome     Comment:  a.) followed by pain clinic 09/09/2015: Cocaine use disorder, moderate, dependence (HCC) No date: DDD (degenerative disc disease) No date: Depression No date: Erectile dysfunction     Comment:  a.) on PDE5i (sildenafil) No date: Frequency-urgency syndrome No date: GERD (gastroesophageal reflux disease) No date: History of hepatitis B     Comment:  1983  TX'D--  NO ISSUES OR SYMPTOMS  SINCE No date: Hypotestosteronemia 09/08/2015: 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[place]) No date: Nocturia No date: Polysubstance abuse ([place])     Comment:  a.) cocaine + marijuana + BZO + opioids No date: PONV (postoperative nausea and vomiting)     Comment:  "difficult to put me to sleep" No date: Spinal headache No date: T2DM (type 2 diabetes mellitus) ([place]) No date: Urine incontinence  Reproductive/Obstetrics                             Anesthesia Physical Anesthesia Plan  ASA: 2  Anesthesia Plan: General   Post-op Pain Management: Ketamine IV*, Celebrex PO (pre-op)*, Gabapentin PO (pre-op)* and Ofirmev IV (intra-op)*   Induction: Intravenous  PONV Risk Score and Plan: 4 or greater and Ondansetron, Dexamethasone, Midazolam and Treatment may vary due to age or medical condition  Airway Management Planned: Oral ETT  Additional Equipment: None  Intra-op Plan:   Post-operative Plan: Extubation in OR  Informed Consent: I have reviewed the patients History and Physical, chart, labs and discussed the procedure including the risks, benefits and alternatives for the proposed anesthesia with the patient or authorized representative who has indicated his/her understanding and acceptance.     Dental advisory given  Plan Discussed with: CRNA and Surgeon  Anesthesia Plan Comments: (Discussed risks of anesthesia with patient, including PONV, sore throat, lip/dental/eye damage. Rare risks discussed as well, such as cardiorespiratory and neurological sequelae, and allergic reactions. Discussed the role of CRNA in patient's perioperative care. Patient understands.)        Anesthesia Quick Evaluation

## 2021-07-19 NOTE — H&P (Signed)
The patient has been re-examined, and the chart reviewed, and there have been no interval changes to the documented history and physical.    The risks, benefits, and alternatives have been discussed at length. The patient expressed understanding of the risks benefits and agreed with plans for surgical intervention.  Sally-Anne Wamble P. Briggs Edelen, Jr. M.D.    

## 2021-07-20 ENCOUNTER — Encounter: Payer: Self-pay | Admitting: Orthopedic Surgery

## 2021-07-20 LAB — GLUCOSE, CAPILLARY
Glucose-Capillary: 162 mg/dL — ABNORMAL HIGH (ref 70–99)
Glucose-Capillary: 120 mg/dL — ABNORMAL HIGH (ref 70–99)
Glucose-Capillary: 164 mg/dL — ABNORMAL HIGH (ref 70–99)
Glucose-Capillary: 117 mg/dL — ABNORMAL HIGH (ref 70–99)

## 2021-07-20 NOTE — Evaluation (Signed)
Occupational Therapy Evaluation Patient Details Name: Erik Martinez MRN: [id number] DOB: 07-08-1955 Today's Date: 07/20/2021   History of Present Illness Patient is a 66 year old male with loose implant status post left total knee revision arthroplasty. Now status post left knee revision arthroplasty performed on 07/19/21.   Clinical Impression   Upon entering the room, pt seated in recliner chair and agreeable to OT intervention. Pt reports living in an RV camper at baseline independently without use of AD. He does have intermittent assistance as needed once he returns home. OT educated pt on self care tasks and polar care system which he verbalized understanding of as well as paper handout provided. Pt is able to reach and grab onto sock and is able to thread clothing onto B LEs to increased Ind in LB dressing. Pt stands from various surfaces in room with supervision and use of RW. Pt ambulates 200' with RW with supervision and cuing for forward gaze. Pt has all needed equipment at this time and does not need further OT intervention. OT to SIGN OFF.      Recommendations for follow up therapy are one component of a multi-disciplinary discharge planning process, led by the attending physician.  Recommendations may be updated based on patient status, additional functional criteria and insurance authorization.   Follow Up Recommendations  No OT follow up    Assistance Recommended at Discharge PRN  Patient can return home with the following Help with stairs or ramp for entrance;Assist for transportation    Functional Status Assessment  Patient has not had a recent decline in their functional status  Equipment Recommendations  None recommended by OT       Precautions / Restrictions Precautions Precautions: Knee Precaution Booklet Issued: Yes (comment) Required Braces or Orthoses:  (knee immobilizer not required as patient able to SLR independently) Restrictions Weight Bearing  Restrictions: Yes LLE Weight Bearing: Weight bearing as tolerated      Mobility Bed Mobility Overal bed mobility: Modified Independent                  Transfers Overall transfer level: Needs assistance Equipment used: Rolling walker (2 wheels) Transfers: Sit to/from Stand Sit to Stand: Supervision           General transfer comment: verbal cues for hand placement. no physical assistance is required with standing      Balance Overall balance assessment: Needs assistance Sitting-balance support: No upper extremity supported, Feet supported Sitting balance-Leahy Scale: Normal     Standing balance support: Bilateral upper extremity supported, Reliant on assistive device for balance Standing balance-Leahy Scale: Fair Standing balance comment: using rolling walker for UE support in standing                           ADL either performed or assessed with clinical judgement   ADL Overall ADL's : Needs assistance/impaired                                       General ADL Comments: supervision overall for functional transfers and self care tasks with use of RW     Vision Patient Visual Report: No change from baseline              Pertinent Vitals/Pain Pain Assessment Pain Assessment: 0-10 Pain Score: 7  Pain Location: L knee Pain Descriptors / Indicators: Discomfort  Pain Intervention(s): Monitored during session, Repositioned, Premedicated before session, Ice applied     Hand Dominance Right   Extremity/Trunk Assessment Upper Extremity Assessment Upper Extremity Assessment: Overall WFL for tasks assessed   Lower Extremity Assessment Lower Extremity Assessment: Defer to PT evaluation LLE Deficits / Details: SLR independently. patient able to activate hip/knee/ankle movement in gravity eliminated position. no knee buckling with weight bearing LLE Sensation: WNL       Communication Communication Communication: No  difficulties   Cognition Arousal/Alertness: Awake/alert Behavior During Therapy: WFL for tasks assessed/performed Overall Cognitive Status: Within Functional Limits for tasks assessed                                 General Comments: patient able to follow all directions without difficulty     General Comments  encouraged patient to perform HEP with emphasis on knee extension and knee flexion.            Home Living Family/patient expects to be discharged to:: Private residence Living Arrangements: Alone Available Help at Discharge: Friend(s);Available PRN/intermittently Type of Home: Other(Comment) (large RV) Home Access: Stairs to enter Entrance Stairs-Number of Steps: 1 Entrance Stairs-Rails: Left Home Layout: One level     Bathroom Shower/Tub: Teacher, early years/pre: Handicapped height     Home Equipment: Toilet riser   Additional Comments: Pt is able to use shower with TTB in barn that is nearby      Prior Functioning/Environment Prior Level of Function : Independent/Modified Independent;Working/employed;Driving                                 OT Goals(Current goals can be found in the care plan section) Acute Rehab OT Goals Patient Stated Goal: to go home OT Goal Formulation: With patient Time For Goal Achievement: 08/03/21 Potential to Achieve Goals: Good  OT Frequency:         AM-PAC OT "6 Clicks" Daily Activity     Outcome Measure Help from another person eating meals?: None Help from another person taking care of personal grooming?: None   Help from another person bathing (including washing, rinsing, drying)?: None Help from another person to put on and taking off regular upper body clothing?: None Help from another person to put on and taking off regular lower body clothing?: None 6 Click Score: 20   End of Session Equipment Utilized During Treatment: Rolling walker (2 wheels) Nurse Communication: Mobility  status  Activity Tolerance: Patient tolerated treatment well Patient left: in chair;with call bell/phone within reach                   Time: 6387-5643 OT Time Calculation (min): 26 min Charges:  OT General Charges $OT Visit: 1 Visit OT Evaluation $OT Eval Low Complexity: 1 Low OT Treatments $Self Care/Home Management : 8-22 mins  Darleen Crocker, MS, OTR/L , CBIS ascom [phone]  07/20/21, 11:29 AM

## 2021-07-20 NOTE — Progress Notes (Signed)
Physical Therapy Treatment Patient Details Name: Erik Martinez MRN: [id number] DOB: August 08, 1955 Today's Date: 07/20/2021   History of Present Illness Patient is a 66 year old male with loose implant status post left total knee revision arthroplasty. Now status post left knee revision arthroplasty performed on 07/19/21.    PT Comments    Patient is making excellent progress towards meeting PT goals. Gait training performed again in hallway with improved gait kinematics with verbal cues using rolling walker. Reinforced positioning of LLE with focus on knee extension while in the bed. Reviewed home exercise program. PT will follow up in the morning.     Recommendations for follow up therapy are one component of a multi-disciplinary discharge planning process, led by the attending physician.  Recommendations may be updated based on patient status, additional functional criteria and insurance authorization.  Follow Up Recommendations  Home health PT     Assistance Recommended at Discharge PRN  Patient can return home with the following Assist for transportation   Equipment Recommendations  Rolling walker (2 wheels)    Recommendations for Other Services       Precautions / Restrictions Precautions Precautions: Knee Restrictions Weight Bearing Restrictions: Yes LLE Weight Bearing: Weight bearing as tolerated     Mobility  Bed Mobility Overal bed mobility: Modified Independent                  Transfers Overall transfer level: Modified independent Equipment used: Rolling walker (2 wheels) Transfers: Sit to/from Stand Sit to Stand: Supervision           General transfer comment: supervision for safety. good safety awareness demonstrated    Ambulation/Gait Ambulation/Gait assistance: Supervision Gait Distance (Feet): 175 Feet Assistive device: Rolling walker (2 wheels) Gait Pattern/deviations: Step-to pattern, Step-through pattern, Antalgic Gait velocity:  decreased     General Gait Details: gait training progressed in hallway this session. patient demonstrated step to pattern with decreased step length bilaterally, improving to step through pattern with increased ambulation distance. with cues for heel contact, demonstrated improvement.   Stairs Stairs: Yes Stairs assistance: Supervision Stair Management: Two rails, Step to pattern, Forwards Number of Stairs: 4 General stair comments: with initial instruction, patient demonstrated stair negotiation without difficulty   Wheelchair Mobility    Modified Rankin (Stroke Patients Only)       Balance Overall balance assessment: Needs assistance Sitting-balance support: No upper extremity supported, Feet supported Sitting balance-Leahy Scale: Normal     Standing balance support: Bilateral upper extremity supported, Reliant on assistive device for balance Standing balance-Leahy Scale: Fair Standing balance comment: using rolling walker for UE support in standing                            Cognition Arousal/Alertness: Awake/alert Behavior During Therapy: WFL for tasks assessed/performed Overall Cognitive Status: Within Functional Limits for tasks assessed                                 General Comments: patient able to follow all directions without difficulty        Exercises Total Joint Exercises Ankle Circles/Pumps: AROM, Strengthening, Left, 5 reps, Supine Quad Sets: AROM, Strengthening, Left, 10 reps, Supine Straight Leg Raises: AROM, Strengthening, Supine (x 1 rep) Goniometric ROM: L knee 9-56 degrees Other Exercises Other Exercises: reviewed exercises in HEP. emphasis on left knee extension with positioning of LLE  on  towel roll in bed at end of session    General Comments        Pertinent Vitals/Pain Pain Assessment Pain Assessment: Faces Pain Score: 8  Faces Pain Scale: Hurts little more Pain Location: L knee Pain Descriptors /  Indicators: Discomfort Pain Intervention(s): Limited activity within patient's tolerance (patient used the call bell to ask for pain medication at the end of session; polar care re-applied)    Home Living Family/patient expects to be discharged to:: Private residence Living Arrangements: Alone Available Help at Discharge: Friend(s);Available PRN/intermittently Type of Home: Other(Comment) (large RV) Home Access: Stairs to enter Entrance Stairs-Rails: Left Entrance Stairs-Number of Steps: 1   Home Layout: One level Home Equipment: Toilet riser Additional Comments: Pt is able to use shower with TTB in barn that is nearby    Prior Function            PT Goals (current goals can now be found in the care plan section) Acute Rehab PT Goals Patient Stated Goal: to be independent PT Goal Formulation: With patient Time For Goal Achievement: 08/03/21 Potential to Achieve Goals: Good Additional Goals Additional Goal #1: patient will increased left knee flexion to 80 degrees for improved gait pattern and independence with functional mobility Progress towards PT goals: Progressing toward goals    Frequency    BID      PT Plan Current plan remains appropriate    Co-evaluation              AM-PAC PT "6 Clicks" Mobility   Outcome Measure  Help needed turning from your back to your side while in a flat bed without using bedrails?: None Help needed moving from lying on your back to sitting on the side of a flat bed without using bedrails?: None Help needed moving to and from a bed to a chair (including a wheelchair)?: A Little Help needed standing up from a chair using your arms (e.g., wheelchair or bedside chair)?: A Little Help needed to walk in hospital room?: A Little Help needed climbing 3-5 steps with a railing? : A Little 6 Click Score: 20    End of Session   Activity Tolerance: Patient tolerated treatment well Patient left: in bed;with call bell/phone within  reach;with SCD's reapplied (towel roll under left heel, polar care re-applied L knee, SCD RLE)   PT Visit Diagnosis: Other abnormalities of gait and mobility (R26.89);Pain Pain - Right/Left: Left Pain - part of body: Knee     Time: 1313-1330 PT Time Calculation (min) (ACUTE ONLY): 17 min  Charges:  $Gait Training: 8-22 mins                    Minna Merritts, PT, MPT    Erik Martinez 07/20/2021, 2:39 PM

## 2021-07-20 NOTE — Plan of Care (Signed)

## 2021-07-20 NOTE — Progress Notes (Signed)
Spoke with the patient  He lives alone in a camper but has friends that will be helping him He needs a rolling walker and a 3 in 1, [employer] to deliver to the bedside prior to DC He is set up with [facility] His friend will provide transportation
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
## 2021-07-20 NOTE — Anesthesia Postprocedure Evaluation (Signed)
Anesthesia Post Note  Patient: Erik Martinez  Procedure(s) Performed: TOTAL KNEE REVISION (Left: Knee)  Patient location during evaluation: PACU Anesthesia Type: General Level of consciousness: awake and alert Pain management: pain level controlled Vital Signs Assessment: post-procedure vital signs reviewed and stable Respiratory status: spontaneous breathing, nonlabored ventilation and respiratory function stable Cardiovascular status: blood pressure returned to baseline and stable Postop Assessment: no apparent nausea or vomiting Anesthetic complications: no   No notable events documented.   Last Vitals:  Vitals:   07/19/21 2312 07/20/21 0342  BP: (!) 143/78 (!) 147/61  Pulse: 92 86  Resp: 17 17  Temp: 36.7 C 36.8 C  SpO2: 95% 98%    Last Pain:  Vitals:   07/20/21 0557  TempSrc:   PainSc: [name]

## 2021-07-20 NOTE — Evaluation (Signed)
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 Now status post left knee revision arthroplasty performed on 07/19/21.  Clinical Impression  Patient is agreeable to PT and eager to mobilize following knee surgery. He is independent at baseline without assistive device but had knee pain and limited range of motion. He lives alone in a camper but plans to have assistance from a friend at discharge. Today, the patient is mobilizing well. He ambulated a lap in the hallway with cues required for improved gait kinematics. Stair training completed as well. Exercises initiated and discussed importance of increasing range of motion of left knee for improved gait pattern and functional independence with mobility. He complains of L knee pain that does seem to improve with mobility. PT will follow up to maximize independence in preparation for discharge home.      Recommendations for follow up therapy are one component of a multi-disciplinary discharge planning process, led by the attending physician.  Recommendations may be updated based on patient status, additional functional criteria and insurance authorization.  Follow Up Recommendations Home health PT      Assistance Recommended at Discharge PRN  Patient can return home with the following  Assist for transportation    Equipment Recommendations Rolling walker (2 wheels)  Recommendations for Other Services       Functional Status Assessment Patient has had a recent decline in their functional status and demonstrates the ability to make significant improvements in function in a reasonable and predictable amount of time.     Precautions / Restrictions Precautions Precautions: Knee Precaution Booklet Issued: Yes (comment) Required Braces or  Orthoses:  (knee immobilizer not required as patient able to SLR independently) Restrictions Weight Bearing Restrictions: Yes LLE Weight Bearing: Weight bearing as tolerated      Mobility  Bed Mobility Overal bed mobility: Modified Independent                  Transfers Overall transfer level: Needs assistance Equipment used: Rolling walker (2 wheels) Transfers: Sit to/from Stand Sit to Stand: Supervision           General transfer comment: verbal cues for hand placement. no physical assistance is required with standing    Ambulation/Gait Ambulation/Gait assistance: Min guard, Supervision Gait Distance (Feet): 180 Feet Assistive device: Rolling walker (2 wheels) Gait Pattern/deviations: Step-to pattern, Step-through pattern, Antalgic, Knee flexed in stance - left Gait velocity: decreased     General Gait Details: verbal cues for heel contact with emphasis on knee extension. step to pattern initially progressing to step through. patietn demonstrated correct use of rolling walker for off loading LLE with initial verbal cues. discussed with MD the possibility of using crutches due to limited space within the camper. however, after demonstrating how to use the rolling walker with side steps through narrow spaces, patient feels that the rolling walker is the best option for home.  Stairs Stairs: Yes Stairs assistance: Supervision Stair Management: Two rails, Step to pattern, Forwards Number of Stairs: 4 General stair comments: with initial instruction, patient demonstrated stair negotiation without difficulty  Wheelchair Mobility    Modified Rankin (Stroke Patients Only)       Balance Overall balance assessment: Needs assistance Sitting-balance support: No upper extremity supported, Feet supported Sitting balance-Leahy Scale: Normal     Standing balance support: Bilateral  upper extremity supported Standing balance-Leahy Scale: Fair Standing balance comment:  using rolling walker for UE support in standing                             Pertinent Vitals/Pain Pain Assessment Pain Assessment: 0-10 Pain Score: 8  Pain Location: L knee Pain Descriptors / Indicators: Discomfort Pain Intervention(s): Limited activity within patient's tolerance, Monitored during session, Premedicated before session, Repositioned, RN gave pain meds during session, Patient requesting pain meds-RN notified (polar care re-applied)    Home Living Family/patient expects to be discharged to:: Private residence Living Arrangements: Alone Available Help at Discharge: Friend(s);Available PRN/intermittently Type of Home:  (camper) Home Access: Stairs to enter Entrance Stairs-Rails:  (left side grab bar) Entrance Stairs-Number of Steps: 1   Home Layout: One level Home Equipment: Toilet riser      Prior Function Prior Level of Function : Independent/Modified Independent;Working/employed;Driving                     Hand Dominance        Extremity/Trunk Assessment   Upper Extremity Assessment Upper Extremity Assessment: Overall WFL for tasks assessed    Lower Extremity Assessment Lower Extremity Assessment: LLE deficits/detail LLE Deficits / Details: SLR independently. patient able to activate hip/knee/ankle movement in gravity eliminated position. no knee buckling with weight bearing LLE Sensation: WNL       Communication   Communication: No difficulties  Cognition Arousal/Alertness: Awake/alert Behavior During Therapy: WFL for tasks assessed/performed Overall Cognitive Status: Within Functional Limits for tasks assessed                                 General Comments: patient able to follow all directions without difficulty        General Comments General comments (skin integrity, edema, etc.): encouraged patient to perform HEP with emphasis on knee extension and knee flexion.    Exercises Total Joint  Exercises Straight Leg Raises: AROM, Strengthening, Supine (x 1 rep) Goniometric ROM: L knee 9-56 degrees   Assessment/Plan    PT Assessment Patient needs continued PT services  PT Problem List Decreased strength;Decreased range of motion;Decreased activity tolerance;Decreased balance;Decreased mobility;Pain;Decreased knowledge of use of DME       PT Treatment Interventions DME instruction;Gait training;Stair training;Functional mobility training;Therapeutic activities;Therapeutic exercise;Balance training;Patient/family education;Manual techniques    PT Goals (Current goals can be found in the Care Plan section)  Acute Rehab PT Goals Patient Stated Goal: to be independent PT Goal Formulation: With patient Time For Goal Achievement: 08/03/21 Potential to Achieve Goals: Good Additional Goals Additional Goal #1: patient will increased left knee flexion to 80 degrees for improved gait pattern and independence with functional mobility    Frequency BID     Co-evaluation               AM-PAC PT "6 Clicks" Mobility  Outcome Measure Help needed turning from your back to your side while in a flat bed without using bedrails?: None Help needed moving from lying on your back to sitting on the side of a flat bed without using bedrails?: None Help needed moving to and from a bed to a chair (including a wheelchair)?: A Little Help needed standing up from a chair using your arms (e.g., wheelchair or bedside chair)?: A Little Help needed to walk in hospital room?: A Little Help needed climbing 3-5  steps with a railing? : A Little 6 Click Score: 20    End of Session   Activity Tolerance: Patient tolerated treatment well Patient left: in chair;with call bell/phone within reach (towel roll under left heel, polar care re-applied L knee, SCD RLE) Nurse Communication: Mobility status PT Visit Diagnosis: Other abnormalities of gait and mobility (R26.89);Pain Pain - Right/Left: Left Pain -  part of body: Knee    Time: 9518-8416 PT Time Calculation (min) (ACUTE ONLY): 39 min   Charges:   PT Evaluation $PT Eval Low Complexity: 1 Low PT Treatments $Gait Training: 23-37 mins      Minna Merritts, PT, MPT   Percell Locus 07/20/2021, 11:14 AM

## 2021-07-20 NOTE — Progress Notes (Cosign Needed)
Patient is not able to walk the distance required to go the bathroom, or he/she is unable to safely negotiate stairs required to access the bathroom.  A 3in1 BSC will alleviate this problem  

## 2021-07-21 LAB — GLUCOSE, CAPILLARY: Glucose-Capillary: 150 mg/dL — ABNORMAL HIGH (ref 70–99)

## 2021-07-21 MED ORDER — CELECOXIB 200 MG PO CAPS: 200.0000 mg | ORAL_CAPSULE | Freq: Two times a day (BID) | ORAL | 0 refills | Status: DC

## 2021-07-21 MED ORDER — ENOXAPARIN SODIUM 40 MG/0.4ML IJ SOSY: 40.0000 mg | PREFILLED_SYRINGE | INTRAMUSCULAR | 0 refills | Status: DC

## 2021-07-21 NOTE — Progress Notes (Signed)
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 Now status post left knee revision arthroplasty performed on 07/19/21.    PT Comments    Patient is making excellent progress towards meeting PT goals. His reports increased soreness in the left knee today. Knee flexion has increased from 56 to 70 degrees today with pain at end ROM. Gait training progressed in hallway with emphasis on heel strike, knee extension/flexion, and step through gait pattern. He completed stairs again today without difficulty and reports he now will have a ramp in place at home. Encouraged home exercise program and reviewed range of motion/positioning strategies to increase left knee ROM. Patient is safe to discharge home from a mobility perspective. PT will follow up if patient does not discharge home soon as anticipated.    Recommendations for follow up therapy are one component of a multi-disciplinary discharge planning process, led by the attending physician.  Recommendations may be updated based on patient status, additional functional criteria and insurance authorization.  Follow Up Recommendations  Home health PT     Assistance Recommended at Discharge PRN  Patient can return home with the following Assist for transportation   Equipment Recommendations  Rolling walker (2 wheels) (DME has been delivered to the room)    Recommendations for Other Services       Precautions / Restrictions Precautions Precautions: Knee Restrictions Weight Bearing Restrictions: Yes LLE Weight Bearing: Weight bearing as tolerated     Mobility  Bed Mobility Overal bed mobility: Modified Independent                  Transfers Overall transfer level: Modified independent Equipment used: Rolling walker (2  wheels) Transfers: Sit to/from Stand Sit to Stand: Modified independent (Device/Increase time)           General transfer comment: good safety awareness with transfers without physical assistance or cues for technique    Ambulation/Gait Ambulation/Gait assistance: Modified independent (Device/Increase time) Gait Distance (Feet): 200 Feet Assistive device: Rolling walker (2 wheels) Gait Pattern/deviations: Step-to pattern, Step-through pattern, Antalgic Gait velocity: decreased     General Gait Details: mild increased knee flexion noted with standing activity. cues for knee extension and heel toe gait pattern with improvement noted. mild antalgic gait that improved with increased gait distance with increased stance time on LLE   Stairs Stairs: Yes Stairs assistance: Modified independent (Device/Increase time) Stair Management: Two rails, Step to pattern Number of Stairs: 4 General stair comments: with initial verbal cues for technique patient demonstrated correct sequencing. no assistance required for stair training. patient reports a ramp is now completed at home and stairs are not required for entry into the camper   Wheelchair Mobility    Modified Rankin (Stroke Patients Only)       Balance                                            Cognition Arousal/Alertness: Awake/alert Behavior During Therapy: WFL for tasks assessed/performed Overall Cognitive Status: Within Functional Limits for tasks assessed  Exercises Total Joint Exercises Quad Sets: AROM, Strengthening, Left, 10 reps, Supine Goniometric ROM: L knee 12-70 degrees. pain with end ROM for flexion Other Exercises Other Exercises: patient reports he has been completing exercises in the HEP and has no questions. reviewed knee flexion stretch and encouraged to increase as tolerated. also encouraged use of bone foam, towel roll, and  positioning in bed/chair to promote knee extension    General Comments        Pertinent Vitals/Pain Pain Assessment Pain Assessment: 0-10 Pain Score: 8  Pain Location: L knee, L hamstring and calf Pain Descriptors / Indicators: Discomfort, Sore Pain Intervention(s): Limited activity within patient's tolerance    Home Living                          Prior Function            PT Goals (current goals can now be found in the care plan section) Acute Rehab PT Goals Patient Stated Goal: to be independent PT Goal Formulation: With patient Time For Goal Achievement: 08/03/21 Potential to Achieve Goals: Good Progress towards PT goals: Progressing toward goals    Frequency    BID      PT Plan Current plan remains appropriate    Co-evaluation              AM-PAC PT "6 Clicks" Mobility   Outcome Measure  Help needed turning from your back to your side while in a flat bed without using bedrails?: None Help needed moving from lying on your back to sitting on the side of a flat bed without using bedrails?: None Help needed moving to and from a bed to a chair (including a wheelchair)?: A Little Help needed standing up from a chair using your arms (e.g., wheelchair or bedside chair)?: A Little Help needed to walk in hospital room?: A Little Help needed climbing 3-5 steps with a railing? : A Little 6 Click Score: 20    End of Session   Activity Tolerance: Patient tolerated treatment well Patient left: in bed;with call bell/phone within reach;with SCD's reapplied (SCD RLE, polar care L knee, towel roll L heel) Nurse Communication: Mobility status (request for bowel movement medication/pain meds) PT Visit Diagnosis: Other abnormalities of gait and mobility (R26.89);Pain Pain - Right/Left: Left Pain - part of body: Knee     Time: 9373-4287 PT Time Calculation (min) (ACUTE ONLY): 32 min  Charges:  $Gait Training: 8-22 mins $Therapeutic Exercise: 8-22  mins                     Minna Merritts, PT, MPT   Percell Locus 07/21/2021, 9:56 AM

## 2021-07-21 NOTE — Progress Notes (Signed)
  Subjective: 2 Days Post-Op Procedure(s) (LRB): TOTAL KNEE REVISION (Left) Patient reports pain as moderate.   Patient is well, and has had no acute complaints or problems Plan is to go Home after hospital stay. Negative for chest pain and shortness of breath Fever: no Gastrointestinal: negative for nausea and vomiting.  Patient has not had a bowel movement.  Objective: Vital signs in last 24 hours: Temp:  [97.8 F (36.6 C)-98.5 F (36.9 C)] 97.8 F (36.6 C) (07/12 0324) Pulse Rate:  [75-89] 75 (07/12 0324) Resp:  [16-18] 18 (07/12 0324) BP: (130-167)/(68-86) 134/86 (07/12 0324) SpO2:  [98 %-100 %] 100 % (07/12 0324)  Intake/Output from previous day:  Intake/Output Summary (Last 24 hours) at 07/21/2021 0712 Last data filed at 07/21/2021 0557 Gross per 24 hour  Intake 1196.89 ml  Output 1475 ml  Net -278.11 ml    Intake/Output this shift: No intake/output data recorded.  Labs: No results for input(s): "HGB" in the last 72 hours. No results for input(s): "WBC", "RBC", "HCT", "PLT" in the last 72 hours. No results for input(s): "NA", "K", "CL", "CO2", "BUN", "CREATININE", "GLUCOSE", "CALCIUM" in the last 72 hours. No results for input(s): "LABPT", "INR" in the last 72 hours.   EXAM General - Patient is Alert, Appropriate, and Oriented Extremity - Neurovascular intact Dorsiflexion/Plantar flexion intact Compartment soft Dressing/Incision -Postoperative dressing remains in place., Polar Care in place and working. Following removal of dressing, no drainage noted.  Motor Function - intact, moving foot and toes well on exam.  Cardiovascular- Regular rate and rhythm, no murmurs/rubs/gallops Respiratory- Lungs clear to auscultation bilaterally Gastrointestinal- soft, nontender, and active bowel sounds   Assessment/Plan: 2 Days Post-Op Procedure(s) (LRB): TOTAL KNEE REVISION (Left) Principal Problem:   S/P revision of total knee  Estimated body mass index is 28.7 kg/m  as calculated from the following:   Height as of this encounter: '5\' 10"'$  (1.778 m).   Weight as of this encounter: 90.7 kg. Advance diet Up with therapy  Anticipate d/c today pending AM therapy session.   Post-op dressing removed. , Hemovac removed., and Mini compression dressing applied.   DVT Prophylaxis - Lovenox, Ted hose, and SCDs Weight-Bearing as tolerated to left leg  Cassell Smiles, PA-C [facility] 07/21/2021, 7:12 AM

## 2021-07-22 DIAGNOSIS — N529 Male erectile dysfunction, unspecified: Secondary | ICD-10-CM | POA: Diagnosis not present

## 2021-07-22 DIAGNOSIS — F5104 Psychophysiologic insomnia: Secondary | ICD-10-CM | POA: Diagnosis not present

## 2021-07-22 DIAGNOSIS — E1169 Type 2 diabetes mellitus with other specified complication: Secondary | ICD-10-CM | POA: Diagnosis not present

## 2021-07-22 DIAGNOSIS — M1990 Unspecified osteoarthritis, unspecified site: Secondary | ICD-10-CM | POA: Diagnosis not present

## 2021-07-22 DIAGNOSIS — M5030 Other cervical disc degeneration, unspecified cervical region: Secondary | ICD-10-CM | POA: Diagnosis not present

## 2021-07-22 DIAGNOSIS — F4310 Post-traumatic stress disorder, unspecified: Secondary | ICD-10-CM | POA: Diagnosis not present

## 2021-07-22 DIAGNOSIS — J449 Chronic obstructive pulmonary disease, unspecified: Secondary | ICD-10-CM | POA: Diagnosis not present

## 2021-07-22 DIAGNOSIS — I2510 Atherosclerotic heart disease of native coronary artery without angina pectoris: Secondary | ICD-10-CM | POA: Diagnosis not present

## 2021-07-22 DIAGNOSIS — T84033D Mechanical loosening of internal left knee prosthetic joint, subsequent encounter: Secondary | ICD-10-CM | POA: Diagnosis not present

## 2021-07-22 DIAGNOSIS — Z7901 Long term (current) use of anticoagulants: Secondary | ICD-10-CM | POA: Diagnosis not present

## 2021-07-22 DIAGNOSIS — F411 Generalized anxiety disorder: Secondary | ICD-10-CM | POA: Diagnosis not present

## 2021-07-22 DIAGNOSIS — Z8673 Personal history of transient ischemic attack (TIA), and cerebral infarction without residual deficits: Secondary | ICD-10-CM | POA: Diagnosis not present

## 2021-07-22 DIAGNOSIS — M5136 Other intervertebral disc degeneration, lumbar region: Secondary | ICD-10-CM | POA: Diagnosis not present

## 2021-07-22 DIAGNOSIS — Z96649 Presence of unspecified artificial hip joint: Secondary | ICD-10-CM | POA: Diagnosis not present

## 2021-07-22 DIAGNOSIS — E1151 Type 2 diabetes mellitus with diabetic peripheral angiopathy without gangrene: Secondary | ICD-10-CM | POA: Diagnosis not present

## 2021-07-22 DIAGNOSIS — I70219 Atherosclerosis of native arteries of extremities with intermittent claudication, unspecified extremity: Secondary | ICD-10-CM | POA: Diagnosis not present

## 2021-07-22 DIAGNOSIS — E785 Hyperlipidemia, unspecified: Secondary | ICD-10-CM | POA: Diagnosis not present

## 2021-07-22 DIAGNOSIS — K760 Fatty (change of) liver, not elsewhere classified: Secondary | ICD-10-CM | POA: Diagnosis not present

## 2021-07-22 DIAGNOSIS — E1140 Type 2 diabetes mellitus with diabetic neuropathy, unspecified: Secondary | ICD-10-CM | POA: Diagnosis not present

## 2021-07-22 DIAGNOSIS — G894 Chronic pain syndrome: Secondary | ICD-10-CM | POA: Diagnosis not present

## 2021-07-22 DIAGNOSIS — Z791 Long term (current) use of non-steroidal anti-inflammatories (NSAID): Secondary | ICD-10-CM | POA: Diagnosis not present

## 2021-07-22 DIAGNOSIS — K219 Gastro-esophageal reflux disease without esophagitis: Secondary | ICD-10-CM | POA: Diagnosis not present

## 2021-07-22 DIAGNOSIS — F1420 Cocaine dependence, uncomplicated: Secondary | ICD-10-CM | POA: Diagnosis not present

## 2021-07-22 DIAGNOSIS — F319 Bipolar disorder, unspecified: Secondary | ICD-10-CM | POA: Diagnosis not present

## 2021-07-24 LAB — AEROBIC/ANAEROBIC CULTURE W GRAM STAIN (SURGICAL/DEEP WOUND)
Culture: NO GROWTH
Culture: NO GROWTH
Gram Stain: NONE SEEN
Gram Stain: NONE SEEN

## 2021-07-27 ENCOUNTER — Telehealth: Payer: Self-pay | Admitting: Family Medicine

## 2021-07-27 DIAGNOSIS — E785 Hyperlipidemia, unspecified: Secondary | ICD-10-CM | POA: Diagnosis not present

## 2021-07-27 DIAGNOSIS — E1169 Type 2 diabetes mellitus with other specified complication: Secondary | ICD-10-CM | POA: Diagnosis not present

## 2021-07-27 DIAGNOSIS — T84033D Mechanical loosening of internal left knee prosthetic joint, subsequent encounter: Secondary | ICD-10-CM | POA: Diagnosis not present

## 2021-07-27 DIAGNOSIS — J449 Chronic obstructive pulmonary disease, unspecified: Secondary | ICD-10-CM | POA: Diagnosis not present

## 2021-07-27 DIAGNOSIS — F411 Generalized anxiety disorder: Secondary | ICD-10-CM | POA: Diagnosis not present

## 2021-07-27 DIAGNOSIS — K760 Fatty (change of) liver, not elsewhere classified: Secondary | ICD-10-CM | POA: Diagnosis not present

## 2021-07-27 DIAGNOSIS — F5104 Psychophysiologic insomnia: Secondary | ICD-10-CM | POA: Diagnosis not present

## 2021-07-27 DIAGNOSIS — E1140 Type 2 diabetes mellitus with diabetic neuropathy, unspecified: Secondary | ICD-10-CM | POA: Diagnosis not present

## 2021-07-27 DIAGNOSIS — I70219 Atherosclerosis of native arteries of extremities with intermittent claudication, unspecified extremity: Secondary | ICD-10-CM | POA: Diagnosis not present

## 2021-07-27 DIAGNOSIS — F319 Bipolar disorder, unspecified: Secondary | ICD-10-CM | POA: Diagnosis not present

## 2021-07-27 DIAGNOSIS — M1990 Unspecified osteoarthritis, unspecified site: Secondary | ICD-10-CM | POA: Diagnosis not present

## 2021-07-27 DIAGNOSIS — G894 Chronic pain syndrome: Secondary | ICD-10-CM | POA: Diagnosis not present

## 2021-07-27 DIAGNOSIS — N529 Male erectile dysfunction, unspecified: Secondary | ICD-10-CM | POA: Diagnosis not present

## 2021-07-27 DIAGNOSIS — M5136 Other intervertebral disc degeneration, lumbar region: Secondary | ICD-10-CM | POA: Diagnosis not present

## 2021-07-27 DIAGNOSIS — E1151 Type 2 diabetes mellitus with diabetic peripheral angiopathy without gangrene: Secondary | ICD-10-CM | POA: Diagnosis not present

## 2021-07-27 DIAGNOSIS — Z8673 Personal history of transient ischemic attack (TIA), and cerebral infarction without residual deficits: Secondary | ICD-10-CM | POA: Diagnosis not present

## 2021-07-27 DIAGNOSIS — F1420 Cocaine dependence, uncomplicated: Secondary | ICD-10-CM | POA: Diagnosis not present

## 2021-07-27 DIAGNOSIS — Z791 Long term (current) use of non-steroidal anti-inflammatories (NSAID): Secondary | ICD-10-CM | POA: Diagnosis not present

## 2021-07-27 DIAGNOSIS — Z96649 Presence of unspecified artificial hip joint: Secondary | ICD-10-CM | POA: Diagnosis not present

## 2021-07-27 DIAGNOSIS — M5030 Other cervical disc degeneration, unspecified cervical region: Secondary | ICD-10-CM | POA: Diagnosis not present

## 2021-07-27 DIAGNOSIS — K219 Gastro-esophageal reflux disease without esophagitis: Secondary | ICD-10-CM | POA: Diagnosis not present

## 2021-07-27 DIAGNOSIS — F4310 Post-traumatic stress disorder, unspecified: Secondary | ICD-10-CM | POA: Diagnosis not present

## 2021-07-27 DIAGNOSIS — Z7901 Long term (current) use of anticoagulants: Secondary | ICD-10-CM | POA: Diagnosis not present

## 2021-07-27 DIAGNOSIS — I2510 Atherosclerotic heart disease of native coronary artery without angina pectoris: Secondary | ICD-10-CM | POA: Diagnosis not present

## 2021-07-27 NOTE — Telephone Encounter (Signed)
Pt came in to pick up his medication (Ozempic) and was wondering is he could have his pregabalin (LYRICA) 200 MG and would like to have it increased to 300 MG where it was about 6 months ago.  Pt would like to have a call back to let him know if this can be done.  Pharm:  [address] [place], [place].

## 2021-07-27 NOTE — Telephone Encounter (Signed)
Okay. Thanks for the heads up. - Dr. Amalia Hailey

## 2021-07-28 DIAGNOSIS — F5104 Psychophysiologic insomnia: Secondary | ICD-10-CM | POA: Diagnosis not present

## 2021-07-28 DIAGNOSIS — K760 Fatty (change of) liver, not elsewhere classified: Secondary | ICD-10-CM | POA: Diagnosis not present

## 2021-07-28 DIAGNOSIS — M5030 Other cervical disc degeneration, unspecified cervical region: Secondary | ICD-10-CM | POA: Diagnosis not present

## 2021-07-28 DIAGNOSIS — M1990 Unspecified osteoarthritis, unspecified site: Secondary | ICD-10-CM | POA: Diagnosis not present

## 2021-07-28 DIAGNOSIS — E1151 Type 2 diabetes mellitus with diabetic peripheral angiopathy without gangrene: Secondary | ICD-10-CM | POA: Diagnosis not present

## 2021-07-28 DIAGNOSIS — E1140 Type 2 diabetes mellitus with diabetic neuropathy, unspecified: Secondary | ICD-10-CM | POA: Diagnosis not present

## 2021-07-28 DIAGNOSIS — Z96649 Presence of unspecified artificial hip joint: Secondary | ICD-10-CM | POA: Diagnosis not present

## 2021-07-28 DIAGNOSIS — K219 Gastro-esophageal reflux disease without esophagitis: Secondary | ICD-10-CM | POA: Diagnosis not present

## 2021-07-28 DIAGNOSIS — J449 Chronic obstructive pulmonary disease, unspecified: Secondary | ICD-10-CM | POA: Diagnosis not present

## 2021-07-28 DIAGNOSIS — T84033D Mechanical loosening of internal left knee prosthetic joint, subsequent encounter: Secondary | ICD-10-CM | POA: Diagnosis not present

## 2021-07-28 DIAGNOSIS — E785 Hyperlipidemia, unspecified: Secondary | ICD-10-CM | POA: Diagnosis not present

## 2021-07-28 DIAGNOSIS — I70219 Atherosclerosis of native arteries of extremities with intermittent claudication, unspecified extremity: Secondary | ICD-10-CM | POA: Diagnosis not present

## 2021-07-28 DIAGNOSIS — I2510 Atherosclerotic heart disease of native coronary artery without angina pectoris: Secondary | ICD-10-CM | POA: Diagnosis not present

## 2021-07-28 DIAGNOSIS — N529 Male erectile dysfunction, unspecified: Secondary | ICD-10-CM | POA: Diagnosis not present

## 2021-07-28 DIAGNOSIS — Z791 Long term (current) use of non-steroidal anti-inflammatories (NSAID): Secondary | ICD-10-CM | POA: Diagnosis not present

## 2021-07-28 DIAGNOSIS — F1420 Cocaine dependence, uncomplicated: Secondary | ICD-10-CM | POA: Diagnosis not present

## 2021-07-28 DIAGNOSIS — Z7901 Long term (current) use of anticoagulants: Secondary | ICD-10-CM | POA: Diagnosis not present

## 2021-07-28 DIAGNOSIS — Z8673 Personal history of transient ischemic attack (TIA), and cerebral infarction without residual deficits: Secondary | ICD-10-CM | POA: Diagnosis not present

## 2021-07-28 DIAGNOSIS — F411 Generalized anxiety disorder: Secondary | ICD-10-CM | POA: Diagnosis not present

## 2021-07-28 DIAGNOSIS — E1169 Type 2 diabetes mellitus with other specified complication: Secondary | ICD-10-CM | POA: Diagnosis not present

## 2021-07-28 DIAGNOSIS — M5136 Other intervertebral disc degeneration, lumbar region: Secondary | ICD-10-CM | POA: Diagnosis not present

## 2021-07-28 DIAGNOSIS — F4310 Post-traumatic stress disorder, unspecified: Secondary | ICD-10-CM | POA: Diagnosis not present

## 2021-07-28 DIAGNOSIS — F319 Bipolar disorder, unspecified: Secondary | ICD-10-CM | POA: Diagnosis not present

## 2021-07-28 DIAGNOSIS — G894 Chronic pain syndrome: Secondary | ICD-10-CM | POA: Diagnosis not present

## 2021-07-28 NOTE — Telephone Encounter (Signed)
Spoke with Erik Martinez.  He confirmed he use to be on Lyrica 300 mg tid.  He states the 200 mg just aren't working as well.  If okay to send new Rx,  please sign order below.

## 2021-07-28 NOTE — Telephone Encounter (Signed)
The typical maximum dose is 300 mg bid - I would be uncomfortable changing to TID dosing.  I would like to defer to Dr. Jacinto Reap.  Would anticipate her being on the computer in the next day or so.

## 2021-07-28 NOTE — Telephone Encounter (Signed)
Ok to change and I will sign as a refill

## 2021-07-29 MED ORDER — PREGABALIN 300 MG PO CAPS: 300.0000 mg | ORAL_CAPSULE | Freq: Two times a day (BID) | ORAL | 2 refills | Status: DC

## 2021-07-29 NOTE — Telephone Encounter (Signed)
Given the timing, I will sign myself and defer an increase in dose to Dr. Jacinto Reap.

## 2021-07-29 NOTE — Telephone Encounter (Signed)
Pt called back. He has 4 days of the original dosing left. He said if this is going to be a problem, he would rather just stay on the current dose so he does not go without.

## 2021-08-01 ENCOUNTER — Other Ambulatory Visit: Payer: Self-pay

## 2021-08-01 ENCOUNTER — Emergency Department
Admission: EM | Admit: 2021-08-01 | Discharge: 2021-08-01 | Disposition: A | Discharge status: Home / Self Care | Payer: PPO | Attending: Emergency Medicine | Admitting: Emergency Medicine

## 2021-08-01 DIAGNOSIS — L509 Urticaria, unspecified: Secondary | ICD-10-CM | POA: Insufficient documentation

## 2021-08-01 DIAGNOSIS — L03116 Cellulitis of left lower limb: Secondary | ICD-10-CM | POA: Insufficient documentation

## 2021-08-01 DIAGNOSIS — T8142XA Infection following a procedure, deep incisional surgical site, initial encounter: Secondary | ICD-10-CM | POA: Diagnosis not present

## 2021-08-01 DIAGNOSIS — T8149XA Infection following a procedure, other surgical site, initial encounter: Secondary | ICD-10-CM

## 2021-08-01 DIAGNOSIS — M9689 Other intraoperative and postprocedural complications and disorders of the musculoskeletal system: Secondary | ICD-10-CM | POA: Diagnosis present

## 2021-08-01 DIAGNOSIS — Z471 Aftercare following joint replacement surgery: Secondary | ICD-10-CM | POA: Diagnosis not present

## 2021-08-01 DIAGNOSIS — E119 Type 2 diabetes mellitus without complications: Secondary | ICD-10-CM | POA: Insufficient documentation

## 2021-08-01 LAB — CBC WITH DIFFERENTIAL/PLATELET
Abs Immature Granulocytes: 0.03 10*3/uL (ref 0.00–0.07)
Basophils Absolute: 0 10*3/uL (ref 0.0–0.1)
Basophils Relative: 1 %
Eosinophils Absolute: 0.3 10*3/uL (ref 0.0–0.5)
Eosinophils Relative: 4 %
HCT: 43.4 % (ref 39.0–52.0)
Hemoglobin: 13.1 g/dL (ref 13.0–17.0)
Immature Granulocytes: 1 %
Lymphocytes Relative: 23 %
Lymphs Abs: 1.5 10*3/uL (ref 0.7–4.0)
MCH: 23.6 pg — ABNORMAL LOW (ref 26.0–34.0)
MCHC: 30.2 g/dL (ref 30.0–36.0)
MCV: 78.2 fL — ABNORMAL LOW (ref 80.0–100.0)
Monocytes Absolute: 0.5 10*3/uL (ref 0.1–1.0)
Monocytes Relative: 8 %
Neutro Abs: 4.1 10*3/uL (ref 1.7–7.7)
Neutrophils Relative %: 63 %
Platelets: 242 10*3/uL (ref 150–400)
RBC: 5.55 MIL/uL (ref 4.22–5.81)
RDW: 19.1 % — ABNORMAL HIGH (ref 11.5–15.5)
WBC: 6.4 10*3/uL (ref 4.0–10.5)
nRBC: 0 % (ref 0.0–0.2)

## 2021-08-01 LAB — COMPREHENSIVE METABOLIC PANEL
ALT: 16 U/L (ref 0–44)
AST: 16 U/L (ref 15–41)
Albumin: 4.2 g/dL (ref 3.5–5.0)
Alkaline Phosphatase: 62 U/L (ref 38–126)
Anion gap: 7 (ref 5–15)
BUN: 22 mg/dL (ref 8–23)
CO2: 28 mmol/L (ref 22–32)
Calcium: 9.1 mg/dL (ref 8.9–10.3)
Chloride: 100 mmol/L (ref 98–111)
Creatinine, Ser: 1.36 mg/dL — ABNORMAL HIGH (ref 0.61–1.24)
GFR, Estimated: 58 mL/min — ABNORMAL LOW (ref >60)
Glucose, Bld: 194 mg/dL — ABNORMAL HIGH (ref 70–99)
Potassium: 4.7 mmol/L (ref 3.5–5.1)
Sodium: 135 mmol/L (ref 135–145)
Total Bilirubin: 0.7 mg/dL (ref 0.3–1.2)
Total Protein: 7.8 g/dL (ref 6.5–8.1)

## 2021-08-01 MED ORDER — CEPHALEXIN 500 MG PO CAPS: 500.0000 mg | ORAL_CAPSULE | Freq: Three times a day (TID) | ORAL | 0 refills | Status: DC

## 2021-08-01 MED ORDER — PREDNISONE 20 MG PO TABS: 40.0000 mg | ORAL_TABLET | Freq: Every day | ORAL | 0 refills | Status: DC

## 2021-08-01 MED ORDER — DEXAMETHASONE SODIUM PHOSPHATE 10 MG/ML IJ SOLN
10.0000 mg | Freq: Once | INTRAMUSCULAR | Status: AC
  Administered 2021-08-01: 10 mg via INTRAMUSCULAR
  Filled 2021-08-01: qty 1

## 2021-08-01 MED ORDER — CEPHALEXIN 500 MG PO CAPS
500.0000 mg | ORAL_CAPSULE | Freq: Once | ORAL | Status: AC
  Administered 2021-08-01: 500 mg via ORAL
  Filled 2021-08-01: qty 1

## 2021-08-01 NOTE — ED Triage Notes (Signed)
Ambulatory to triage with c/o onset of hives to majority of body. Onset yesterday morning. Denies any new medications or environmental factors. Pt 8 days post left knee replacement. Of note, incision site closed with staples. One staple site reddened and edematous. Pt reports he was able to squeeze pus out of site earlier.  Took '50mg'$  Benadryl apx 4 hours ago without relief.  Pt has hx of chronic urticaria, but has not had flare up in apx 20 yrs.

## 2021-08-01 NOTE — ED Provider Notes (Signed)
[facility] Provider Note    Event Date/Time   First MD Initiated Contact with Patient 08/01/21 [phone]     (approximate)   History   Chief Complaint: Urticaria   HPI  Erik Martinez is a 66 y.o. male with a history of diabetes, GERD, polysubstance abuse, bipolar disorder, chronic pain who comes ED complaining of generalized urticaria.  This is a recurrent chronic issue for him.  Denies shortness of breath vomiting or abdominal pain.  No new medications or exposures.  Patient also complains of a small area of redness and swelling at the left knee where he has a healing surgical incision from knee surgery performed 8 days ago.  No chest pain or shortness of breath.  No fever     Physical Exam   Triage Vital Signs: ED Triage Vitals  Enc Vitals Group     BP 08/01/21 0506 (!) 171/85     Pulse Rate 08/01/21 0506 79     Resp 08/01/21 0506 16     Temp 08/01/21 0506 98.1 F (36.7 C)     Temp Source 08/01/21 0506 Oral     SpO2 08/01/21 0506 98 %     Weight --      Height --      Head Circumference --      Peak Flow --      Pain Score 08/01/21 0507 2     Pain Loc --      Pain Edu? --      Excl. in [place]? --     Most recent vital signs: Vitals:   08/01/21 0506  BP: (!) 171/85  Pulse: 79  Resp: 16  Temp: 98.1 F (36.7 C)  SpO2: 98%    General: Awake, no distress.  CV:  Good peripheral perfusion.  Regular rate and rhythm Resp:  Normal effort.  Clear to auscultation bilaterally Abd:  No distention.  Soft nontender Other:  Generalized urticaria on the trunk, upper extremities, neck, face.  Less prominent lesions on bilateral legs.  No bullae petechia or purpura.  Nontender.    At the proximal end of the surgical incision over the left knee, there is also about a 2 cm area of induration, redness, tenderness consistent with cellulitis.  No purulent drainage from the wound.  No dehiscence of the wound.  ED Results / Procedures / Treatments    Labs (all labs ordered are listed, but only abnormal results are displayed) Labs Reviewed  CBC WITH DIFFERENTIAL/PLATELET - Abnormal; Notable for the following components:      Result Value   MCV 78.2 (*)    MCH 23.6 (*)    RDW 19.1 (*)    All other components within normal limits  COMPREHENSIVE METABOLIC PANEL - Abnormal; Notable for the following components:   Glucose, Bld 194 (*)    Creatinine, Ser 1.36 (*)    GFR, Estimated 58 (*)    All other components within normal limits     EKG    RADIOLOGY    PROCEDURES:  Procedures   MEDICATIONS ORDERED IN ED: Medications  dexamethasone (DECADRON) injection 10 mg (10 mg Intramuscular Given 08/01/21 0612)  cephALEXin (KEFLEX) capsule 500 mg (500 mg Oral Given 08/01/21 0612)     IMPRESSION / MDM / ASSESSMENT AND PLAN / ED COURSE  I reviewed the triage vital signs and the nursing notes.  Differential diagnosis includes, but is not limited to, allergic reaction, recurrent chronic urticaria, surgical wound cellulitis.  Not consistent with anaphylaxis.  Doubt osteomyelitis, septic arthritis, abscess, Stevens-Johnson, vasculitis  Patient's presentation is most consistent with acute complicated illness / injury requiring diagnostic workup.  Patient comes ED complaining of generalized hives.  This appears to be histamine mediated, but not hypersensitivity.  Not consistent with anaphylaxis.  He does have a mild cellulitis of the proximal end of the surgical wound, will start Keflex and recommend follow-up with his surgeon.       FINAL CLINICAL IMPRESSION(S) / ED DIAGNOSES   Final diagnoses:  Urticaria  Postoperative cellulitis of surgical wound     Rx / DC Orders   ED Discharge Orders          Ordered    predniSONE (DELTASONE) 20 MG tablet  Daily with breakfast        08/01/21 0620    cephALEXin (KEFLEX) 500 MG capsule  3 times daily        08/01/21 0620             Note:   This document was prepared using Dragon voice recognition software and may include unintentional dictation errors.   Carrie Mew, MD 08/01/21 [phone]

## 2021-08-02 DIAGNOSIS — G894 Chronic pain syndrome: Secondary | ICD-10-CM | POA: Diagnosis not present

## 2021-08-02 DIAGNOSIS — N529 Male erectile dysfunction, unspecified: Secondary | ICD-10-CM | POA: Diagnosis not present

## 2021-08-02 DIAGNOSIS — E1169 Type 2 diabetes mellitus with other specified complication: Secondary | ICD-10-CM | POA: Diagnosis not present

## 2021-08-02 DIAGNOSIS — F1420 Cocaine dependence, uncomplicated: Secondary | ICD-10-CM | POA: Diagnosis not present

## 2021-08-02 DIAGNOSIS — E1151 Type 2 diabetes mellitus with diabetic peripheral angiopathy without gangrene: Secondary | ICD-10-CM | POA: Diagnosis not present

## 2021-08-02 DIAGNOSIS — F4310 Post-traumatic stress disorder, unspecified: Secondary | ICD-10-CM | POA: Diagnosis not present

## 2021-08-02 DIAGNOSIS — M1990 Unspecified osteoarthritis, unspecified site: Secondary | ICD-10-CM | POA: Diagnosis not present

## 2021-08-02 DIAGNOSIS — K760 Fatty (change of) liver, not elsewhere classified: Secondary | ICD-10-CM | POA: Diagnosis not present

## 2021-08-02 DIAGNOSIS — I2510 Atherosclerotic heart disease of native coronary artery without angina pectoris: Secondary | ICD-10-CM | POA: Diagnosis not present

## 2021-08-02 DIAGNOSIS — F5104 Psychophysiologic insomnia: Secondary | ICD-10-CM | POA: Diagnosis not present

## 2021-08-02 DIAGNOSIS — Z791 Long term (current) use of non-steroidal anti-inflammatories (NSAID): Secondary | ICD-10-CM | POA: Diagnosis not present

## 2021-08-02 DIAGNOSIS — J449 Chronic obstructive pulmonary disease, unspecified: Secondary | ICD-10-CM | POA: Diagnosis not present

## 2021-08-02 DIAGNOSIS — K219 Gastro-esophageal reflux disease without esophagitis: Secondary | ICD-10-CM | POA: Diagnosis not present

## 2021-08-02 DIAGNOSIS — T84033D Mechanical loosening of internal left knee prosthetic joint, subsequent encounter: Secondary | ICD-10-CM | POA: Diagnosis not present

## 2021-08-02 DIAGNOSIS — M5136 Other intervertebral disc degeneration, lumbar region: Secondary | ICD-10-CM | POA: Diagnosis not present

## 2021-08-02 DIAGNOSIS — Z96649 Presence of unspecified artificial hip joint: Secondary | ICD-10-CM | POA: Diagnosis not present

## 2021-08-02 DIAGNOSIS — E785 Hyperlipidemia, unspecified: Secondary | ICD-10-CM | POA: Diagnosis not present

## 2021-08-02 DIAGNOSIS — I70219 Atherosclerosis of native arteries of extremities with intermittent claudication, unspecified extremity: Secondary | ICD-10-CM | POA: Diagnosis not present

## 2021-08-02 DIAGNOSIS — E1140 Type 2 diabetes mellitus with diabetic neuropathy, unspecified: Secondary | ICD-10-CM | POA: Diagnosis not present

## 2021-08-02 DIAGNOSIS — F319 Bipolar disorder, unspecified: Secondary | ICD-10-CM | POA: Diagnosis not present

## 2021-08-02 DIAGNOSIS — M5030 Other cervical disc degeneration, unspecified cervical region: Secondary | ICD-10-CM | POA: Diagnosis not present

## 2021-08-02 DIAGNOSIS — Z7901 Long term (current) use of anticoagulants: Secondary | ICD-10-CM | POA: Diagnosis not present

## 2021-08-02 DIAGNOSIS — F411 Generalized anxiety disorder: Secondary | ICD-10-CM | POA: Diagnosis not present

## 2021-08-02 DIAGNOSIS — Z8673 Personal history of transient ischemic attack (TIA), and cerebral infarction without residual deficits: Secondary | ICD-10-CM | POA: Diagnosis not present

## 2021-08-02 NOTE — Telephone Encounter (Signed)
Agreed.  Patient's previous dose is above the recommended maximum limit.  Continue on current dosing 200 mg 3 times daily.  If he would like to consider using a higher dose for better pain control he will need to discuss with pain specialist.

## 2021-08-02 NOTE — Telephone Encounter (Signed)
Dr. Lorelei Pont sent in Lyrica 300 mg bid while you were out of the office.  Do I need to have him not take that and go back to 200 mg tid?  Please advise.

## 2021-08-03 ENCOUNTER — Encounter: Payer: Self-pay | Admitting: Family Medicine

## 2021-08-03 ENCOUNTER — Telehealth (INDEPENDENT_AMBULATORY_CARE_PROVIDER_SITE_OTHER): Payer: PPO | Admitting: Family Medicine

## 2021-08-03 DIAGNOSIS — L509 Urticaria, unspecified: Secondary | ICD-10-CM

## 2021-08-03 DIAGNOSIS — Z96652 Presence of left artificial knee joint: Secondary | ICD-10-CM | POA: Diagnosis not present

## 2021-08-03 MED ORDER — PREDNISONE 20 MG PO TABS: ORAL_TABLET | ORAL | 0 refills | Status: DC

## 2021-08-03 NOTE — Telephone Encounter (Signed)
300 BID is fine... same total as 200 mg TID

## 2021-08-03 NOTE — Patient Instructions (Addendum)
Get zyrtec over the counter and take 10 mg at bedtime daily to suppress your hives Take the prednisone as directed 40 mg daily for 5 days then 20 mg daily for 5 more days  If hives return please let us know and follow up   This may make you feel jittery and hungry and may also raise your blood sugar while you take it

## 2021-08-03 NOTE — Telephone Encounter (Signed)
Noted  

## 2021-08-03 NOTE — Telephone Encounter (Signed)
Mr. Osmun notified by telephone that the max dose for Lyrica is 600 mg a day so he can either take the 200 mg three time a day or the 300 mg two times a day.  Patient states understanding.  While on the phone,  patient states he just had a knee replacement on  07/19/21 and was seen in the ER on 08/01/21 due to he had whelps all over.  They diagnosed him with Urticaria.  He was treated with a dexamethasone injection and given Keflex 500 mg while in the ER, then sent home on prednisone and Keflex.  He states the whelps are coming back and was told to follow up with his PCP.  He is not having any SOB or is any distress.  Dr. Diona Browner has no available appointments today.  I did schedule him with Dr. Glori Bickers who only had virtual appointments available.   He is willing to come in if needed.  FYI to Dr. Glori Bickers and Dr. Diona Browner.
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
## 2021-08-03 NOTE — Assessment & Plan Note (Signed)
Acute on chronic (no anaphylaxis or angioedema) Recent knee replacement followed by mild infection of wound (better now) Seen for both in ER Reviewed hospital records, lab results and studies in detail  His hives started to return this am and then went away with the prednisone (only has one dose left) Per pt -never found out what the trigger was   Px prednisone 40 mg for 5 more days followed by 20 mg for 5 days  Disc poss side eff Also inst to get zyrtec 10 mg otc and take daily at bedtime If hives return again-will need to f/u with pcp and start a work up

## 2021-08-04 ENCOUNTER — Telehealth: Payer: Self-pay | Admitting: *Deleted

## 2021-08-04 NOTE — Telephone Encounter (Signed)
Patient is calling to request terbinafine prescription. Called pharmacy for status,said that patient never picked up,was put back in stock.  Called patient to explain that medication was never picked up but they can have ready for him today, verbalized understanding.

## 2021-08-06 ENCOUNTER — Ambulatory Visit (INDEPENDENT_AMBULATORY_CARE_PROVIDER_SITE_OTHER): Payer: PPO | Admitting: Nurse Practitioner

## 2021-08-06 ENCOUNTER — Telehealth: Payer: Self-pay | Admitting: Family Medicine

## 2021-08-06 VITALS — BP 132/78 | HR 93 | Temp 97.7°F | Resp 12 | Wt 192.0 lb

## 2021-08-06 DIAGNOSIS — Z96652 Presence of left artificial knee joint: Secondary | ICD-10-CM | POA: Diagnosis not present

## 2021-08-06 DIAGNOSIS — R21 Rash and other nonspecific skin eruption: Secondary | ICD-10-CM | POA: Insufficient documentation

## 2021-08-06 DIAGNOSIS — L299 Pruritus, unspecified: Secondary | ICD-10-CM | POA: Diagnosis not present

## 2021-08-06 MED ORDER — HYDROXYZINE HCL 10 MG PO TABS: 10.0000 mg | ORAL_TABLET | Freq: Two times a day (BID) | ORAL | 0 refills | Status: DC | PRN

## 2021-08-06 MED ORDER — FAMOTIDINE 20 MG PO TABS: 20.0000 mg | ORAL_TABLET | Freq: Two times a day (BID) | ORAL | 0 refills | Status: DC

## 2021-08-06 NOTE — Assessment & Plan Note (Addendum)
Unsure of rash origin.  Patient has had surgery recently been placed on due to antibiotics recently also.  No red flag signs or symptoms in office.  No angioedema or difficulty swallowing or speaking breathing.  Patient to continue prednisone 40 mg and taper down to prednisone 20 mg per previous provider.  Continue taking Zyrtec we will add on hydroxyzine 10 mg twice daily as needed.  Also start patient on Pepcid 20 mg twice daily.  Follow-up with primary care provider if no improvement  Did inform patient that he can split the 20 mg of prednisone twice a day as he felt like it was wearing off before the end of the day.  He wanted to know why can be placed on prednisone 80 mg.  I explained to patient is a large dose of prednisone and I do not personally believe this warranted currently patient continue the prednisone that he is prescribed

## 2021-08-06 NOTE — Progress Notes (Signed)
Acute Office Visit  Subjective:     Patient ID: Erik Martinez, male    DOB: October 16, 1955, 66 y.o.   MRN: [id number]  Chief Complaint  Patient presents with   Urticaria    Rash improved but is spreading again-present on right arm, under right arm, elbow and hand. He is currently on Prednisone and Zyrtec. This has been an issue since knee surgery on 07/19/21.     Patient is in today for rash  Rash for approx 1 week ago. States that he went to the ED and was given a steroid shot and pills. States that they also gave him so amoxicillin. States that he finished the amoxicillin and he has been on keflex.  She is currently on Prednisone 40 mg and zyrtec daily. Patient concerned because he takes a prednisone in the morning and feels a rash coming back in the evening.  He is taking 20 mg tablets twice a day.  He is also been taking Zyrtec daily, states he took Zyrtec twice yesterday.  States he has tried Benadryl but it does not do anything for him.  Review of Systems  Constitutional:  Negative for chills and fever.  Respiratory:  Negative for shortness of breath.   Cardiovascular:  Negative for chest pain and leg swelling.  Gastrointestinal:  Positive for constipation.  Skin:  Positive for itching and rash.        Objective:    BP 132/78   Pulse 93   Temp 97.7 F (36.5 C)   Resp 12   Wt 192 lb (87.1 kg)   SpO2 96%   BMI 27.55 kg/m    Physical Exam Constitutional:      Appearance: Normal appearance.  Cardiovascular:     Rate and Rhythm: Normal rate and regular rhythm.     Heart sounds: Normal heart sounds.  Pulmonary:     Effort: Pulmonary effort is normal.     Breath sounds: Normal breath sounds.  Skin:    Findings: Rash present.          Comments: Rashes under bilateral axilla.  And bilateral elbows.  Urticarial in nature  Neurological:     Mental Status: He is alert.     No results found for any visits on 08/06/21.      Assessment & Plan:   Problem List  Items Addressed This Visit       Musculoskeletal and Integument   Rash - Primary    Unsure of rash origin.  Patient has had surgery recently been placed on due to antibiotics recently also.  No red flag signs or symptoms in office.  No angioedema or difficulty swallowing or speaking breathing.  Patient to continue prednisone 40 mg and taper down to prednisone 20 mg per previous provider.  Continue taking Zyrtec we will add on hydroxyzine 10 mg twice daily as needed.  Also start patient on Pepcid 20 mg twice daily.  Follow-up with primary care provider if no improvement  Did inform patient that he can split the 20 mg of prednisone twice a day as he felt like it was wearing off before the end of the day.  He wanted to know why can be placed on prednisone 80 mg.  I explained to patient is a large dose of prednisone and I do not personally believe this warranted currently patient continue the prednisone that he is prescribed      Relevant Medications   famotidine (PEPCID) 20 MG tablet   hydrOXYzine (  ATARAX) 10 MG tablet     Other   Itching    Patient currently taking Zyrtec daily.  Will write patient hydroxyzine 10 mg twice daily as needed needed for itching and rash.      Relevant Medications   famotidine (PEPCID) 20 MG tablet   hydrOXYzine (ATARAX) 10 MG tablet    Meds ordered this encounter  Medications   famotidine (PEPCID) 20 MG tablet    Sig: Take 1 tablet (20 mg total) by mouth 2 (two) times daily.    Dispense:  15 tablet    Refill:  0    Order Specific Question:   Supervising Provider    Answer:   Glori Bickers, MARNE A [[id number]]   hydrOXYzine (ATARAX) 10 MG tablet    Sig: Take 1 tablet (10 mg total) by mouth 2 (two) times daily as needed for itching.    Dispense:  30 tablet    Refill:  0    Order Specific Question:   Supervising Provider    Answer:   Loura Pardon A [[id number]]    Return if symptoms worsen or fail to improve, for with Dr. Diona Browner.  Romilda Garret, NP

## 2021-08-06 NOTE — Telephone Encounter (Signed)
Evaluated in office  

## 2021-08-06 NOTE — Telephone Encounter (Signed)
I spoke with pt; pt said starting on 07/23 began with hives on neck, face and chest pt was seen at [facility] ED and given amoxicillin and prednisone. Pt had VV visit on 08/03/21 and restarted on prednisone and was advised if worsened or did not improve to have in office eval. Pt said rash did improve but worsening again. Pt said rash is spreading again and today rash on rt arm,elbow and hand. Pt is presently taking prednisone 40 mg and Zyrtec bid. No difficulty breathing and no swelling in mouth,lips, tongue, throat or neck. Pt scheduled appt with Romilda Garret NP 08/06/21 at 12 noon. UC & ED precautions given and pt voiced understanding. Sending note to Romilda Garret NP.

## 2021-08-06 NOTE — Assessment & Plan Note (Signed)
Patient currently taking Zyrtec daily.  Will write patient hydroxyzine 10 mg twice daily as needed needed for itching and rash.

## 2021-08-06 NOTE — Telephone Encounter (Signed)
Patient called this morning and spoke to the access nurse and said that he had hives under arm and on right arm, he said it started breaking back out this morning. He said they told him to call back when we opened and that they would send a note. He said the prednisone is not helping. Please callback at [phone]

## 2021-08-06 NOTE — Telephone Encounter (Signed)
[facility] - Client TELEPHONE ADVICE RECORD [facility] Patient Name: Erik Martinez Gender: Male DOB: 1955-08-16 Age: 66 Y 62 M 17 D Return Phone Number: [phone] (Primary) Address: City/ State/ Zip: [place]  [address] Client [place] - Client Client Site [place] Provider AA - PHYSICIAN, Verita Schneiders- MD Contact Type Call Who Is Calling Patient / Member / Family / Caregiver Call Type Triage / Clinical Relationship To Patient Self Return Phone Number [phone] (Primary) Chief Complaint Rash - Widespread Reason for Call Medication Question / Request Initial Comment Caller states he had a virtual visit on Wednesday as he had to go to ER. He was put back on steroids, he is taking same dose the ER prescribed him and they are not working. He has a widespread that is getting worse. He would like to know if the dose can be increased. Additional Comment Caller states he is going to be on therapy from 7:30-8 Translation No Disp. Time Eilene Ghazi Time) Disposition Final User 08/06/2021 7:10:57 AM Attempt made - message left Oren Bracket 08/06/2021 7:20:52 AM Clinical Call Yes Hardin Negus RN, Mardene Celeste Final Disposition 08/06/2021 7:20:52 AM Clinical Call Yes Hardin Negus, RN, Mardene Celeste Comments User: Jerrye Beavers Date/Time Eilene Ghazi Time): 08/06/2021 7:16:23 AM Caller states he accidentally disconnected the call from the nurse; Trans to nurse

## 2021-08-06 NOTE — Patient Instructions (Signed)
Nice to see you today I have sent in a different type of antihistamine if you need it for itching I have sent in Pepcid which helps with allergic reaction  Continue the steroids. Follow up with Dr. Diona Browner if you do not continue to improve

## 2021-08-09 DIAGNOSIS — Z96652 Presence of left artificial knee joint: Secondary | ICD-10-CM | POA: Diagnosis not present

## 2021-08-10 DIAGNOSIS — M25512 Pain in left shoulder: Secondary | ICD-10-CM | POA: Diagnosis not present

## 2021-08-10 DIAGNOSIS — M160 Bilateral primary osteoarthritis of hip: Secondary | ICD-10-CM | POA: Diagnosis not present

## 2021-08-10 DIAGNOSIS — M545 Low back pain, unspecified: Secondary | ICD-10-CM | POA: Diagnosis not present

## 2021-08-10 DIAGNOSIS — Z981 Arthrodesis status: Secondary | ICD-10-CM | POA: Diagnosis not present

## 2021-08-10 DIAGNOSIS — Z79891 Long term (current) use of opiate analgesic: Secondary | ICD-10-CM | POA: Diagnosis not present

## 2021-08-10 DIAGNOSIS — G894 Chronic pain syndrome: Secondary | ICD-10-CM | POA: Diagnosis not present

## 2021-08-10 DIAGNOSIS — M4802 Spinal stenosis, cervical region: Secondary | ICD-10-CM | POA: Diagnosis not present

## 2021-08-10 DIAGNOSIS — Z96652 Presence of left artificial knee joint: Secondary | ICD-10-CM | POA: Diagnosis not present

## 2021-08-10 DIAGNOSIS — M5136 Other intervertebral disc degeneration, lumbar region: Secondary | ICD-10-CM | POA: Diagnosis not present

## 2021-08-10 DIAGNOSIS — G890 Central pain syndrome: Secondary | ICD-10-CM | POA: Diagnosis not present

## 2021-08-12 DIAGNOSIS — M25562 Pain in left knee: Secondary | ICD-10-CM | POA: Diagnosis not present

## 2021-08-12 DIAGNOSIS — Z96652 Presence of left artificial knee joint: Secondary | ICD-10-CM | POA: Diagnosis not present
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
## 2021-08-17 ENCOUNTER — Encounter: Payer: Self-pay | Admitting: Psychiatry

## 2021-08-17 ENCOUNTER — Ambulatory Visit: Payer: PPO | Admitting: Psychiatry

## 2021-08-17 VITALS — BP 165/82 | HR 105 | Temp 98.5°F | Wt 201.8 lb

## 2021-08-17 DIAGNOSIS — Z96652 Presence of left artificial knee joint: Secondary | ICD-10-CM | POA: Diagnosis not present

## 2021-08-17 DIAGNOSIS — F331 Major depressive disorder, recurrent, moderate: Secondary | ICD-10-CM

## 2021-08-17 DIAGNOSIS — F4310 Post-traumatic stress disorder, unspecified: Secondary | ICD-10-CM

## 2021-08-17 DIAGNOSIS — M25562 Pain in left knee: Secondary | ICD-10-CM | POA: Diagnosis not present

## 2021-08-19 DIAGNOSIS — Z96652 Presence of left artificial knee joint: Secondary | ICD-10-CM | POA: Diagnosis not present

## 2021-08-20 ENCOUNTER — Other Ambulatory Visit: Payer: PPO

## 2021-08-20 DIAGNOSIS — Z96652 Presence of left artificial knee joint: Secondary | ICD-10-CM | POA: Diagnosis not present

## 2021-08-23 DIAGNOSIS — Z96652 Presence of left artificial knee joint: Secondary | ICD-10-CM | POA: Diagnosis not present

## 2021-08-25 DIAGNOSIS — Z96652 Presence of left artificial knee joint: Secondary | ICD-10-CM | POA: Diagnosis not present

## 2021-08-27 DIAGNOSIS — Z96652 Presence of left artificial knee joint: Secondary | ICD-10-CM | POA: Diagnosis not present

## 2021-08-27 DIAGNOSIS — M25562 Pain in left knee: Secondary | ICD-10-CM | POA: Diagnosis not present

## 2021-08-31 ENCOUNTER — Telehealth: Payer: Self-pay | Admitting: Family Medicine

## 2021-08-31 ENCOUNTER — Ambulatory Visit: Payer: PPO | Admitting: Podiatry

## 2021-08-31 DIAGNOSIS — Z96652 Presence of left artificial knee joint: Secondary | ICD-10-CM | POA: Diagnosis not present

## 2021-08-31 NOTE — Telephone Encounter (Signed)
[facility] called requesting a call back stated pt requested RX pregabalin (LYRICA) 300 MG capsule to be refilled . Pt also got RX pregabalin (LYRICA) 200 MG capsule on 07/29/21 and 08/16/21 from the [facility] . Wants to know should they refill RX as well  . Please Advise [phone]

## 2021-08-31 NOTE — Telephone Encounter (Signed)
Spoke with Erik Martinez.  He states the Lyrica 300 mg bid seems to help him better that the 200 mg tid.  He states he does have an unopened bottle of Lyrica 200 mg from the [facility] but he prefers the Lyrica 300 mg bid.  Okay to tell [facility] to refill the 300 mg capsules?

## 2021-08-31 NOTE — Telephone Encounter (Signed)
Yes.  Continue and refill 300 mg 3 times daily

## 2021-08-31 NOTE — Telephone Encounter (Signed)
Call patient to verify what dose he is taking of gabapentin.

## 2021-09-01 DIAGNOSIS — Z96652 Presence of left artificial knee joint: Secondary | ICD-10-CM | POA: Diagnosis not present

## 2021-09-01 NOTE — Telephone Encounter (Signed)
Left message at [facility] to fill the Lyrica 300 mg to take one capsule bid.  (600 mg max dose daily).

## 2021-09-02 ENCOUNTER — Telehealth: Payer: Self-pay

## 2021-09-02 DIAGNOSIS — Z96652 Presence of left artificial knee joint: Secondary | ICD-10-CM | POA: Diagnosis not present

## 2021-09-02 NOTE — Telephone Encounter (Signed)
Pt last OV with PCP 05/25/21 notes: Return in about 3 weeks (around 06/15/2021) for  Follow up  DM etc No f/u noted.  Pt notified of above & appt scheduled 09/28/21 at pt earliest convenience.

## 2021-09-03 DIAGNOSIS — Z96652 Presence of left artificial knee joint: Secondary | ICD-10-CM | POA: Diagnosis not present

## 2021-09-05 DIAGNOSIS — R7989 Other specified abnormal findings of blood chemistry: Secondary | ICD-10-CM | POA: Insufficient documentation

## 2021-09-05 DIAGNOSIS — G8929 Other chronic pain: Secondary | ICD-10-CM | POA: Insufficient documentation

## 2021-09-05 DIAGNOSIS — N319 Neuromuscular dysfunction of bladder, unspecified: Secondary | ICD-10-CM | POA: Insufficient documentation

## 2021-09-05 DIAGNOSIS — M87059 Idiopathic aseptic necrosis of unspecified femur: Secondary | ICD-10-CM | POA: Insufficient documentation

## 2021-09-07 DIAGNOSIS — M160 Bilateral primary osteoarthritis of hip: Secondary | ICD-10-CM | POA: Diagnosis not present

## 2021-09-07 DIAGNOSIS — Z981 Arthrodesis status: Secondary | ICD-10-CM | POA: Diagnosis not present

## 2021-09-07 DIAGNOSIS — M4802 Spinal stenosis, cervical region: Secondary | ICD-10-CM | POA: Diagnosis not present

## 2021-09-07 DIAGNOSIS — G894 Chronic pain syndrome: Secondary | ICD-10-CM | POA: Diagnosis not present

## 2021-09-07 DIAGNOSIS — M25512 Pain in left shoulder: Secondary | ICD-10-CM | POA: Diagnosis not present

## 2021-09-07 DIAGNOSIS — M545 Low back pain, unspecified: Secondary | ICD-10-CM | POA: Diagnosis not present

## 2021-09-07 DIAGNOSIS — M5136 Other intervertebral disc degeneration, lumbar region: Secondary | ICD-10-CM | POA: Diagnosis not present

## 2021-09-07 DIAGNOSIS — Z79891 Long term (current) use of opiate analgesic: Secondary | ICD-10-CM | POA: Diagnosis not present

## 2021-09-08 DIAGNOSIS — Z96652 Presence of left artificial knee joint: Secondary | ICD-10-CM | POA: Diagnosis not present

## 2021-09-09 ENCOUNTER — Ambulatory Visit: Payer: PPO

## 2021-09-09 ENCOUNTER — Telehealth (INDEPENDENT_AMBULATORY_CARE_PROVIDER_SITE_OTHER): Payer: Self-pay

## 2021-09-09 NOTE — Telephone Encounter (Signed)
Typically if there was something going on with his stent, it wouldn't cause a fever.  I suspect the swelling and discomfort is related to irritation from the knee injury.  As far as his hand going numb, in looking at his previous notes, he's been having hand numbness since we saw him in January, so I don't think that is a new finding. He was referred to neurosurgery in January by Dr. Delana Meyer.  We can have him come in with a dvt study and an art duplex to rule out a DVT as well as to see if the stent is blocked. We can try to do these as [place] only to work him in faster.  If it is abnormal, we can try to work him in. Because he has multiple areas of concern after his fall, he may want to consider presenting to the ED for immediate work up and evaluation.

## 2021-09-09 NOTE — Telephone Encounter (Signed)
Pt states that he had stent placed in left leg 6 months ago by Dr Delana Meyer.  Then he had a knee replacement 6 weeks ago.  Yesterday he took a one foot fall onto the left knee and immediately he started running a fever of 102.  Also his left hand feels as if it is asleep since the fall. He was seen in Dr [facility] office yesterday with x-rays and was told knee is fine.  The pt thinks there maybe something wrong with the stent in his leg.  He states his leg is swollen, he has a temp of 101.5 and he just can't keep going like this hurting and such.  Please advise

## 2021-09-09 NOTE — Telephone Encounter (Signed)
Called pt to tell him what Arna Medici brown said when I got to the part about the swelling and irration was related to the injury to the knee the pt said "I could bend my knee 120 degrees and straighten it completely out, my knee is fine" then he hung up the phone.  I called pt back and got his voicemail I left a message stating that we had referred him to neurosurgery in January and asked if he was seen there. Also I let him know that we could do a dvt study and Art duplex on Wednesday 08/15/2021 if he wants that appt he needs to call the office back ASAP.  I let him know that with all the multiple areas of concern he may want to be seen in the ED for immediate work up and evaluation.  Ask pt to give the office a call back.

## 2021-09-10 ENCOUNTER — Other Ambulatory Visit: Payer: Self-pay

## 2021-09-10 ENCOUNTER — Encounter: Payer: Self-pay | Admitting: Orthopedic Surgery

## 2021-09-10 ENCOUNTER — Inpatient Hospital Stay
Admission: RE | Admit: 2021-09-10 | Discharge: 2021-09-17 | DRG: 487 | Disposition: A | Discharge status: Home Health | Payer: PPO | Source: Ambulatory Visit | Attending: Orthopedic Surgery | Admitting: Orthopedic Surgery

## 2021-09-10 ENCOUNTER — Other Ambulatory Visit
Admission: RE | Admit: 2021-09-10 | Discharge: 2021-09-10 | Disposition: A | Discharge status: Home / Self Care | Payer: PPO | Source: Ambulatory Visit | Attending: Orthopedic Surgery | Admitting: Orthopedic Surgery

## 2021-09-10 ENCOUNTER — Encounter (HOSPITAL_COMMUNITY): Payer: Self-pay

## 2021-09-10 DIAGNOSIS — G894 Chronic pain syndrome: Secondary | ICD-10-CM | POA: Diagnosis not present

## 2021-09-10 DIAGNOSIS — T8454XA Infection and inflammatory reaction due to internal left knee prosthesis, initial encounter: Secondary | ICD-10-CM | POA: Diagnosis not present

## 2021-09-10 DIAGNOSIS — T148XXA Other injury of unspecified body region, initial encounter: Secondary | ICD-10-CM | POA: Diagnosis present

## 2021-09-10 DIAGNOSIS — Z7985 Long-term (current) use of injectable non-insulin antidiabetic drugs: Secondary | ICD-10-CM

## 2021-09-10 DIAGNOSIS — Z471 Aftercare following joint replacement surgery: Secondary | ICD-10-CM | POA: Diagnosis not present

## 2021-09-10 DIAGNOSIS — E1140 Type 2 diabetes mellitus with diabetic neuropathy, unspecified: Secondary | ICD-10-CM | POA: Diagnosis not present

## 2021-09-10 DIAGNOSIS — Y831 Surgical operation with implant of artificial internal device as the cause of abnormal reaction of the patient, or of later complication, without mention of misadventure at the time of the procedure: Secondary | ICD-10-CM | POA: Diagnosis present

## 2021-09-10 DIAGNOSIS — Z833 Family history of diabetes mellitus: Secondary | ICD-10-CM | POA: Diagnosis not present

## 2021-09-10 DIAGNOSIS — Z96652 Presence of left artificial knee joint: Secondary | ICD-10-CM | POA: Diagnosis not present

## 2021-09-10 DIAGNOSIS — K219 Gastro-esophageal reflux disease without esophagitis: Secondary | ICD-10-CM | POA: Diagnosis present

## 2021-09-10 DIAGNOSIS — Z885 Allergy status to narcotic agent status: Secondary | ICD-10-CM

## 2021-09-10 DIAGNOSIS — I2510 Atherosclerotic heart disease of native coronary artery without angina pectoris: Secondary | ICD-10-CM | POA: Diagnosis present

## 2021-09-10 DIAGNOSIS — Z96642 Presence of left artificial hip joint: Secondary | ICD-10-CM | POA: Diagnosis present

## 2021-09-10 DIAGNOSIS — J449 Chronic obstructive pulmonary disease, unspecified: Secondary | ICD-10-CM | POA: Diagnosis present

## 2021-09-10 DIAGNOSIS — B9561 Methicillin susceptible Staphylococcus aureus infection as the cause of diseases classified elsewhere: Secondary | ICD-10-CM | POA: Diagnosis present

## 2021-09-10 DIAGNOSIS — N319 Neuromuscular dysfunction of bladder, unspecified: Secondary | ICD-10-CM | POA: Diagnosis not present

## 2021-09-10 DIAGNOSIS — M009 Pyogenic arthritis, unspecified: Secondary | ICD-10-CM | POA: Diagnosis not present

## 2021-09-10 DIAGNOSIS — E785 Hyperlipidemia, unspecified: Secondary | ICD-10-CM | POA: Diagnosis not present

## 2021-09-10 DIAGNOSIS — T8454XD Infection and inflammatory reaction due to internal left knee prosthesis, subsequent encounter: Secondary | ICD-10-CM | POA: Diagnosis not present

## 2021-09-10 DIAGNOSIS — E119 Type 2 diabetes mellitus without complications: Secondary | ICD-10-CM | POA: Diagnosis not present

## 2021-09-10 DIAGNOSIS — E1169 Type 2 diabetes mellitus with other specified complication: Secondary | ICD-10-CM | POA: Diagnosis not present

## 2021-09-10 DIAGNOSIS — Z888 Allergy status to other drugs, medicaments and biological substances status: Secondary | ICD-10-CM

## 2021-09-10 DIAGNOSIS — Z981 Arthrodesis status: Secondary | ICD-10-CM

## 2021-09-10 DIAGNOSIS — T8459XA Infection and inflammatory reaction due to other internal joint prosthesis, initial encounter: Secondary | ICD-10-CM | POA: Diagnosis not present

## 2021-09-10 DIAGNOSIS — Z79899 Other long term (current) drug therapy: Secondary | ICD-10-CM

## 2021-09-10 DIAGNOSIS — Z91048 Other nonmedicinal substance allergy status: Secondary | ICD-10-CM

## 2021-09-10 DIAGNOSIS — I10 Essential (primary) hypertension: Secondary | ICD-10-CM | POA: Diagnosis present

## 2021-09-10 DIAGNOSIS — Z7982 Long term (current) use of aspirin: Secondary | ICD-10-CM | POA: Diagnosis not present

## 2021-09-10 DIAGNOSIS — G4700 Insomnia, unspecified: Secondary | ICD-10-CM | POA: Diagnosis not present

## 2021-09-10 DIAGNOSIS — Z8673 Personal history of transient ischemic attack (TIA), and cerebral infarction without residual deficits: Secondary | ICD-10-CM | POA: Diagnosis not present

## 2021-09-10 DIAGNOSIS — E1151 Type 2 diabetes mellitus with diabetic peripheral angiopathy without gangrene: Secondary | ICD-10-CM | POA: Diagnosis present

## 2021-09-10 DIAGNOSIS — Z96659 Presence of unspecified artificial knee joint: Secondary | ICD-10-CM | POA: Diagnosis not present

## 2021-09-10 DIAGNOSIS — L24A9 Irritant contact dermatitis due friction or contact with other specified body fluids: Principal | ICD-10-CM

## 2021-09-10 DIAGNOSIS — M87059 Idiopathic aseptic necrosis of unspecified femur: Secondary | ICD-10-CM | POA: Diagnosis not present

## 2021-09-10 DIAGNOSIS — Z7984 Long term (current) use of oral hypoglycemic drugs: Secondary | ICD-10-CM | POA: Diagnosis not present

## 2021-09-10 DIAGNOSIS — T8189XA Other complications of procedures, not elsewhere classified, initial encounter: Secondary | ICD-10-CM | POA: Diagnosis not present

## 2021-09-10 DIAGNOSIS — T8450XA Infection and inflammatory reaction due to unspecified internal joint prosthesis, initial encounter: Secondary | ICD-10-CM | POA: Diagnosis not present

## 2021-09-10 DIAGNOSIS — Z794 Long term (current) use of insulin: Secondary | ICD-10-CM | POA: Diagnosis not present

## 2021-09-10 LAB — URINALYSIS, ROUTINE W REFLEX MICROSCOPIC
Bilirubin Urine: NEGATIVE
Glucose, UA: >=500 mg/dL — AB
Hgb urine dipstick: NEGATIVE
Ketones, ur: 5 mg/dL — AB
Leukocytes,Ua: NEGATIVE
Nitrite: NEGATIVE
Protein, ur: 100 mg/dL — AB
Specific Gravity, Urine: 1.035 — ABNORMAL HIGH (ref 1.005–1.030)
pH: 6 (ref 5.0–8.0)

## 2021-09-10 LAB — CBC
HCT: 40.2 % (ref 39.0–52.0)
Hemoglobin: 13 g/dL (ref 13.0–17.0)
MCH: 25.4 pg — ABNORMAL LOW (ref 26.0–34.0)
MCHC: 32.3 g/dL (ref 30.0–36.0)
MCV: 78.7 fL — ABNORMAL LOW (ref 80.0–100.0)
Platelets: 165 10*3/uL (ref 150–400)
RBC: 5.11 MIL/uL (ref 4.22–5.81)
RDW: 18.6 % — ABNORMAL HIGH (ref 11.5–15.5)
WBC: 10.7 10*3/uL — ABNORMAL HIGH (ref 4.0–10.5)
nRBC: 0 % (ref 0.0–0.2)

## 2021-09-10 LAB — BASIC METABOLIC PANEL
Anion gap: 8 (ref 5–15)
BUN: 21 mg/dL (ref 8–23)
CO2: 24 mmol/L (ref 22–32)
Calcium: 8.9 mg/dL (ref 8.9–10.3)
Chloride: 101 mmol/L (ref 98–111)
Creatinine, Ser: 1.53 mg/dL — ABNORMAL HIGH (ref 0.61–1.24)
GFR, Estimated: 50 mL/min — ABNORMAL LOW (ref >60)
Glucose, Bld: 191 mg/dL — ABNORMAL HIGH (ref 70–99)
Potassium: 3.6 mmol/L (ref 3.5–5.1)
Sodium: 133 mmol/L — ABNORMAL LOW (ref 135–145)

## 2021-09-10 LAB — GLUCOSE, CAPILLARY: Glucose-Capillary: 132 mg/dL — ABNORMAL HIGH (ref 70–99)

## 2021-09-10 LAB — SEDIMENTATION RATE: Sed Rate: 69 mm/hr — ABNORMAL HIGH (ref 0–20)

## 2021-09-10 LAB — C-REACTIVE PROTEIN: CRP: 38.7 mg/dL — ABNORMAL HIGH (ref <1.0)

## 2021-09-10 MED ORDER — LIDOCAINE 5 % EX PTCH
1.0000 | MEDICATED_PATCH | CUTANEOUS | Status: DC
  Administered 2021-09-12 – 2021-09-16 (×4): 1 via TRANSDERMAL
  Filled 2021-09-10 (×5): qty 1

## 2021-09-10 MED ORDER — HYDROMORPHONE HCL 1 MG/ML IJ SOLN
0.5000 mg | INTRAMUSCULAR | Status: DC | PRN
  Administered 2021-09-11 (×2): 1 mg via INTRAVENOUS
  Filled 2021-09-10 (×2): qty 1

## 2021-09-10 MED ORDER — INSULIN ASPART 100 UNIT/ML IJ SOLN
0.0000 [IU] | Freq: Every day | INTRAMUSCULAR | Status: DC
  Administered 2021-09-13: 4 [IU] via SUBCUTANEOUS
  Filled 2021-09-10: qty 1

## 2021-09-10 MED ORDER — ADULT MULTIVITAMIN W/MINERALS CH
1.0000 | ORAL_TABLET | Freq: Every morning | ORAL | Status: DC
  Administered 2021-09-12 – 2021-09-17 (×6): 1 via ORAL
  Filled 2021-09-10 (×6): qty 1

## 2021-09-10 MED ORDER — OXYCODONE HCL 5 MG PO TABS: 5.0000 mg | ORAL_TABLET | ORAL | Status: DC | PRN

## 2021-09-10 MED ORDER — FERROUS SULFATE 325 (65 FE) MG PO TABS
325.0000 mg | ORAL_TABLET | Freq: Every day | ORAL | Status: DC
Start: 2021-09-11 — End: 2021-09-17
  Administered 2021-09-12 – 2021-09-17 (×6): 325 mg via ORAL
  Filled 2021-09-10 (×6): qty 1

## 2021-09-10 MED ORDER — METFORMIN HCL 500 MG PO TABS
500.0000 mg | ORAL_TABLET | Freq: Two times a day (BID) | ORAL | Status: DC
Start: 2021-09-11 — End: 2021-09-17
  Administered 2021-09-11 – 2021-09-17 (×12): 500 mg via ORAL
  Filled 2021-09-10 (×12): qty 1

## 2021-09-10 MED ORDER — INSULIN ASPART 100 UNIT/ML IJ SOLN
0.0000 [IU] | Freq: Three times a day (TID) | INTRAMUSCULAR | Status: DC
  Administered 2021-09-11 – 2021-09-15 (×8): 3 [IU] via SUBCUTANEOUS
  Administered 2021-09-15: 5 [IU] via SUBCUTANEOUS
  Administered 2021-09-15 – 2021-09-16 (×3): 3 [IU] via SUBCUTANEOUS
  Administered 2021-09-16: 2 [IU] via SUBCUTANEOUS
  Administered 2021-09-17: 3 [IU] via SUBCUTANEOUS
  Administered 2021-09-17: 5 [IU] via SUBCUTANEOUS
  Filled 2021-09-10 (×15): qty 1

## 2021-09-10 MED ORDER — OXYCODONE HCL 5 MG PO TABS: 10.0000 mg | ORAL_TABLET | ORAL | Status: DC | PRN

## 2021-09-10 MED ORDER — ACETAMINOPHEN 325 MG PO TABS: 325.0000 mg | ORAL_TABLET | Freq: Four times a day (QID) | ORAL | Status: DC | PRN

## 2021-09-10 MED ORDER — ZOLPIDEM TARTRATE 5 MG PO TABS
10.0000 mg | ORAL_TABLET | Freq: Every evening | ORAL | Status: DC | PRN
  Administered 2021-09-10 – 2021-09-16 (×7): 10 mg via ORAL
  Filled 2021-09-10 (×7): qty 2

## 2021-09-10 MED ORDER — TERBINAFINE HCL 250 MG PO TABS
250.0000 mg | ORAL_TABLET | Freq: Every day | ORAL | Status: DC
  Administered 2021-09-11 – 2021-09-14 (×4): 250 mg via ORAL
  Filled 2021-09-10 (×5): qty 1

## 2021-09-10 MED ORDER — PANTOPRAZOLE SODIUM 40 MG PO TBEC
40.0000 mg | DELAYED_RELEASE_TABLET | Freq: Every day | ORAL | Status: DC
  Administered 2021-09-10: 40 mg via ORAL
  Filled 2021-09-10: qty 1

## 2021-09-10 MED ORDER — HYDROMORPHONE HCL 1 MG/ML IJ SOLN
0.5000 mg | INTRAMUSCULAR | Status: DC | PRN
  Administered 2021-09-10: 0.5 mg via INTRAVENOUS
  Filled 2021-09-10: qty 1

## 2021-09-10 MED ORDER — ONDANSETRON HCL 4 MG/2ML IJ SOLN: 4.0000 mg | Freq: Four times a day (QID) | INTRAMUSCULAR | Status: DC | PRN

## 2021-09-10 MED ORDER — ATORVASTATIN CALCIUM 20 MG PO TABS
20.0000 mg | ORAL_TABLET | Freq: Every day | ORAL | Status: DC
  Administered 2021-09-10 – 2021-09-16 (×7): 20 mg via ORAL
  Filled 2021-09-10 (×7): qty 1

## 2021-09-10 MED ORDER — CYCLOBENZAPRINE HCL 10 MG PO TABS
10.0000 mg | ORAL_TABLET | Freq: Two times a day (BID) | ORAL | Status: DC
  Administered 2021-09-10 – 2021-09-17 (×13): 10 mg via ORAL
  Filled 2021-09-10 (×13): qty 1

## 2021-09-10 MED ORDER — SENNOSIDES-DOCUSATE SODIUM 8.6-50 MG PO TABS
1.0000 | ORAL_TABLET | Freq: Two times a day (BID) | ORAL | Status: DC
  Administered 2021-09-10 – 2021-09-17 (×13): 1 via ORAL
  Filled 2021-09-10 (×13): qty 1

## 2021-09-10 MED ORDER — OXYCODONE HCL 5 MG PO TABS
15.0000 mg | ORAL_TABLET | Freq: Four times a day (QID) | ORAL | Status: DC
  Administered 2021-09-11 – 2021-09-17 (×25): 15 mg via ORAL
  Filled 2021-09-10 (×25): qty 3

## 2021-09-10 MED ORDER — CELECOXIB 200 MG PO CAPS
200.0000 mg | ORAL_CAPSULE | Freq: Every day | ORAL | Status: DC
  Administered 2021-09-10: 200 mg via ORAL
  Filled 2021-09-10: qty 1

## 2021-09-10 MED ORDER — PREGABALIN 75 MG PO CAPS
300.0000 mg | ORAL_CAPSULE | Freq: Two times a day (BID) | ORAL | Status: DC
Start: 2021-09-10 — End: 2021-09-17
  Administered 2021-09-10 – 2021-09-17 (×13): 300 mg via ORAL
  Filled 2021-09-10 (×13): qty 4

## 2021-09-10 MED ORDER — SODIUM CHLORIDE 0.9 % IV SOLN: INTRAVENOUS | Status: DC

## 2021-09-10 NOTE — H&P (Signed)
ORTHOPAEDIC HISTORY & PHYSICAL Feliberto Gottron, [place] - 09/10/2021 1:45 PM EDT Formatting of this note is different from the original. Images from the original note were not included. Chief Complaint: Chief Complaint  Patient presents with  Post Operative Visit  Left knee revision 07/19/21 by Dr. Marry Guan, having drainage and running a fever   Erik Martinez is a 66 y.o. male who presents today status post left knee revision of femoral component, 07/19/2021 by Dr. Marry Guan. Patient had been doing well, 2 days ago patient tripped, fell landed on his left knee. He states he developed a fever up to 102. He had severe pain and swelling throughout the knee. Was seen in the office and had x-rays that were negative for any acute bony abnormality. White count 12.6, sed rate 26, CRP 297. Patient with no other complaints such as chest pain shortness of breath cough congestion runny nose or leg swelling. He states his knee was very swollen and inflamed, he states today he had sudden onset of drainage, purulent from the left anterior knee proximal incision site. Swelling has significantly improved throughout the knee. Patient ambulatory states he is able to bend the knee better than what he could.  Past Medical History: Past Medical History:  Diagnosis Date  CAD (coronary artery disease)  Chronic pain  COPD (chronic obstructive pulmonary disease) (CMS-HCC)  Depression  Diabetes mellitus type 2, uncomplicated (CMS-HCC)  Fatty liver  GERD (gastroesophageal reflux disease)  Hepatitis C  History of chicken pox  History of stroke  Insomnia  Vascular abnormality   Past Surgical History: Past Surgical History:  Procedure Laterality Date  ARTHROSCOPIC ROTATOR CUFF REPAIR 1999  Conversion of left hip hemiarthroplasty to total hip arthroplasty 12/18/2001  Dr. Gerrit Heck  Left knee unicompartmental arthroplasty 06/18/2002  Dr. Gerrit Heck  Manipulation under anesthesia - left knee 09/10/2002  Dr. Gerrit Heck   Left knee arthroscopy 2005  Revision of unicompartmental arthroplasty to left total knee arthroplasty replacement 04/08/2003  Dr. Gerrit Heck  INCISION TENDON SHEATH FOR TRIGGER FINGER Left 09/17/2003  Left long finger  INCISION TENDON SHEATH FOR TRIGGER FINGER Right 09/26/2003  Right long finger  COLONOSCOPY 02/16/2006  DR. SIEGEL - WNL REPEAT IN 10 YRS 2018  Open debridement of the left total knee including removal of old sutures and a significant scar debridement and debridement of bony overgrowth 05/01/2007  Dr Alvan Dame  Revision left total knee replacement, exchange all components 08/13/2009  Dr Lenox Ahr  Right index and ring trigger finger release Right 06/18/2014  Dr Marry Guan  Left knee revision arthroplasty (femoral and polyethylene components) Extensive synovectomy and excision of fibrotic tissue from the left knee 07/19/2021  Dr Marry Guan  bladder stimulator  Lumbar disc replacement at L5-S1  Dr. Gerrit Heck  Lumbar interbody fusion at L4-5  Dr. Gerrit Heck   Past Family History: Family History  Problem Relation Age of Onset  Cancer Mother  Diabetes type II Mother  Osteoporosis (Thinning of bones) Mother  Cancer Father   Medications: Current Outpatient Medications Ordered in Epic  Medication Sig Dispense Refill  aspirin 81 MG EC tablet Take 81 mg by mouth once daily  atorvastatin (LIPITOR) 20 MG tablet Take 20 mg by mouth once daily  ferrous sulfate 324 mg (65 mg iron) EC tablet TAKE ONE TABLET BY MOUTH MONDAY,WEDNESDAY,FRIDAY  JARDIANCE 25 mg tablet Take 25 mg by mouth once daily  metFORMIN (GLUCOPHAGE) 1000 MG tablet Take 1 tablet (1,000 mg total) by mouth 2 (two) times daily with meals. 180 tablet 1  methylnaltrexone (RELISTOR)  150 mg tablet Take 150 mg by mouth once daily  multivitamin tablet Take 1 tablet by mouth once daily  omeprazole (PRILOSEC) 20 MG DR capsule Take 20 mg by mouth once daily  oxyCODONE (ROXICODONE) 15 MG immediate release tablet Take 15 mg by mouth every 6  (six) hours as needed  OZEMPIC 0.25 mg or 0.5 mg(2 mg/1.5 mL) pen injector INJECT 0.'25MG'$  SUBCUTANEOUSLY ONCE A WEEK FOR 28 DAYS, THEN INCREASE TO 0.'5MG'$  ONCE A WEEK. FOR REFILLS CALL IN RX# [id number].  pregabalin (LYRICA) 300 MG capsule Take 300 mg by mouth 2 (two) times daily  tadalafiL (CIALIS) 20 MG tablet Take 20 mg by mouth once daily  terbinafine HCL (LAMISIL) 250 mg tablet Take 250 mg by mouth once daily  testosterone (TESTOPEL) 75 mg pellet Inject 75 mg subcutaneously every 3 (three) months  zolpidem (AMBIEN) 10 mg tablet Take 1 tablet (10 mg total) by mouth at bedtime   No current Epic-ordered facility-administered medications on file.   Allergies: Allergies  Allergen Reactions  Adhesive Tape-Silicones Rash  Whelps -*Paper Tape is ok   Buprenorphine-Naloxone Nausea And Vomiting  Codeine Itching, Nausea And Vomiting and Rash  Morphine Itching, Nausea And Vomiting and Rash    Review of Systems:  A comprehensive 14 point ROS was performed, reviewed by me today, and the pertinent orthopaedic findings are documented in the HPI.  Exam: BP (!) 160/82  Pulse (!) 126  Temp (!) 38.3 C (100.9 F)  SpO2 96%  General:  Well developed, well nourished, no apparent distress, normal affect, patient presents in a wheelchair, able to ambulate with a cane.  HEENT: Head normocephalic, atraumatic, PERRL.   Abdomen: Soft, non tender, non distended, Bowel sounds present.  Heart: Examination of the heart reveals rhythm but tachycardic. There is no murmur noted on ascultation. There is a normal apical pulse.  Lungs: Lungs are clear to auscultation. There is no wheeze, rhonchi, or crackles. There is normal expansion of bilateral chest walls.   Left knee: Left knee shows patient is able to maintain active extension. Incision is intact except for a small opening along the proximal third of the incision, 0.25 cm with watery yellow discharge. There is indurated swollen erythematous area along  the proximal incision measuring 3 x 3 cm that is tender to touch. There are some fluctuance along the suprapatellar region. Knee range of motion present to 90 degrees of flexion but with pain. No swelling edema or tenderness in the lower leg. He is neuro vas intact left lower extremity.  X-rays of the left knee reviewed by me today from 09/08/2021 show intact tibial and femoral components with normal tracking of the patella with no evidence of fracture or periprosthetic fracture or loosening.  Impression: Infection of total knee replacement, initial encounter (CMS-HCC) [T84.59XA, Z96.659] Infection of total knee replacement, initial encounter (CMS-HCC) (primary encounter diagnosis) Status post revision of total replacement of left knee 07/19/2021 Draining postoperative wound, initial encounter  Plan:  62. 66 year old male status post left total knee revision of the femoral component on 07/19/2021. He is suspected to have a septic joint. A culture was obtained today from the wound. The wound was cleansed with chlorhexidine and alcohol and with compression of the suprapatellar soft tissue, yellow watery discharge was expressed from the wound and cultured. Discussed case with Dr. Marry Guan, direct admitted patient to the hospital. Risks, benefits, complications of a left total knee irrigation and debridement, poly exchange have been discussed with the patient. Patient has agreed and consented  to procedure with Dr. Marry Guan today.  This note was generated in part with voice recognition software and I apologize for any typographical errors that were not detected and corrected.  Feliberto Gottron MPA-C   Electronically signed by Feliberto Gottron, [place] at 09/10/2021 3:36 PM EDT

## 2021-09-11 ENCOUNTER — Other Ambulatory Visit: Payer: Self-pay

## 2021-09-11 ENCOUNTER — Inpatient Hospital Stay: Payer: PPO

## 2021-09-11 ENCOUNTER — Inpatient Hospital Stay: Payer: PPO | Admitting: Anesthesiology

## 2021-09-11 ENCOUNTER — Inpatient Hospital Stay: Admission: RE | Admit: 2021-09-11 | Payer: PPO | Source: Home / Self Care | Admitting: Orthopedic Surgery

## 2021-09-11 ENCOUNTER — Encounter
Admission: RE | Disposition: A | Discharge status: Home / Self Care | Payer: Self-pay | Source: Ambulatory Visit | Attending: Orthopedic Surgery

## 2021-09-11 DIAGNOSIS — Z96652 Presence of left artificial knee joint: Secondary | ICD-10-CM | POA: Diagnosis not present

## 2021-09-11 DIAGNOSIS — T8459XA Infection and inflammatory reaction due to other internal joint prosthesis, initial encounter: Secondary | ICD-10-CM | POA: Diagnosis not present

## 2021-09-11 HISTORY — PX: IRRIGATION AND DEBRIDEMENT KNEE: SHX5185

## 2021-09-11 HISTORY — PX: APPLICATION OF WOUND VAC: SHX5189

## 2021-09-11 LAB — SURGICAL PCR SCREEN
MRSA, PCR: NEGATIVE
Staphylococcus aureus: POSITIVE — AB

## 2021-09-11 LAB — GLUCOSE, CAPILLARY
Glucose-Capillary: 186 mg/dL — ABNORMAL HIGH (ref 70–99)
Glucose-Capillary: 157 mg/dL — ABNORMAL HIGH (ref 70–99)
Glucose-Capillary: 157 mg/dL — ABNORMAL HIGH (ref 70–99)

## 2021-09-11 LAB — HIV ANTIBODY (ROUTINE TESTING W REFLEX): HIV Screen 4th Generation wRfx: NONREACTIVE

## 2021-09-11 SURGERY — IRRIGATION AND DEBRIDEMENT KNEE
Anesthesia: General | Site: Knee | Laterality: Left

## 2021-09-11 MED ORDER — HYDROMORPHONE HCL 1 MG/ML IJ SOLN
0.5000 mg | INTRAMUSCULAR | Status: DC | PRN
  Administered 2021-09-11: 1 mg via INTRAVENOUS
  Filled 2021-09-11 (×2): qty 1

## 2021-09-11 MED ORDER — KETAMINE HCL 50 MG/5ML IJ SOSY
PREFILLED_SYRINGE | INTRAMUSCULAR | Status: AC
  Filled 2021-09-11: qty 5

## 2021-09-11 MED ORDER — FENTANYL CITRATE (PF) 100 MCG/2ML IJ SOLN
INTRAMUSCULAR | Status: AC
  Filled 2021-09-11: qty 2

## 2021-09-11 MED ORDER — FLEET ENEMA 7-19 GM/118ML RE ENEM: 1.0000 | ENEMA | Freq: Once | RECTAL | Status: DC | PRN

## 2021-09-11 MED ORDER — MUPIROCIN 2 % EX OINT
1.0000 | TOPICAL_OINTMENT | Freq: Two times a day (BID) | CUTANEOUS | Status: AC
  Administered 2021-09-12 – 2021-09-15 (×6): 1 via NASAL
  Filled 2021-09-11: qty 22

## 2021-09-11 MED ORDER — MENTHOL 3 MG MT LOZG: 1.0000 | LOZENGE | OROMUCOSAL | Status: DC | PRN

## 2021-09-11 MED ORDER — CEFAZOLIN SODIUM 1 G IJ SOLR
INTRAMUSCULAR | Status: AC
  Filled 2021-09-11: qty 20

## 2021-09-11 MED ORDER — DROPERIDOL 2.5 MG/ML IJ SOLN: 0.6250 mg | Freq: Once | INTRAMUSCULAR | Status: DC | PRN

## 2021-09-11 MED ORDER — LIDOCAINE HCL (PF) 2 % IJ SOLN
INTRAMUSCULAR | Status: AC
  Filled 2021-09-11: qty 5

## 2021-09-11 MED ORDER — SODIUM CHLORIDE 0.9 % IR SOLN
Status: DC | PRN
  Administered 2021-09-11 (×3): 3000 mL

## 2021-09-11 MED ORDER — HYDROMORPHONE HCL 1 MG/ML IJ SOLN
INTRAMUSCULAR | Status: AC
  Filled 2021-09-11: qty 1

## 2021-09-11 MED ORDER — PANTOPRAZOLE SODIUM 40 MG PO TBEC
40.0000 mg | DELAYED_RELEASE_TABLET | Freq: Two times a day (BID) | ORAL | Status: DC
  Administered 2021-09-11 – 2021-09-17 (×12): 40 mg via ORAL
  Filled 2021-09-11 (×12): qty 1

## 2021-09-11 MED ORDER — PHENOL 1.4 % MT LIQD: 1.0000 | OROMUCOSAL | Status: DC | PRN

## 2021-09-11 MED ORDER — VANCOMYCIN HCL 1000 MG IV SOLR: 1000.0000 mg | Freq: Once | INTRAVENOUS | Status: DC

## 2021-09-11 MED ORDER — OXYCODONE HCL 5 MG PO TABS
ORAL_TABLET | ORAL | Status: AC
  Filled 2021-09-11: qty 2

## 2021-09-11 MED ORDER — PROPOFOL 1000 MG/100ML IV EMUL
INTRAVENOUS | Status: AC
Start: 2021-09-11 — End: ?
  Filled 2021-09-11: qty 100

## 2021-09-11 MED ORDER — SURGIPHOR WOUND IRRIGATION SYSTEM - OPTIME
TOPICAL | Status: DC | PRN
  Administered 2021-09-11: 450 mL via TOPICAL

## 2021-09-11 MED ORDER — ACETAMINOPHEN 10 MG/ML IV SOLN
1000.0000 mg | Freq: Once | INTRAVENOUS | Status: DC
  Filled 2021-09-11: qty 100

## 2021-09-11 MED ORDER — FENTANYL CITRATE (PF) 100 MCG/2ML IJ SOLN
INTRAMUSCULAR | Status: DC | PRN
  Administered 2021-09-11 (×2): 100 ug via INTRAVENOUS

## 2021-09-11 MED ORDER — ONDANSETRON HCL 4 MG/2ML IJ SOLN
INTRAMUSCULAR | Status: DC | PRN
  Administered 2021-09-11: 4 mg via INTRAVENOUS

## 2021-09-11 MED ORDER — OXYCODONE HCL ER 10 MG PO T12A
EXTENDED_RELEASE_TABLET | ORAL | Status: AC
  Filled 2021-09-11: qty 1

## 2021-09-11 MED ORDER — MAGNESIUM HYDROXIDE 400 MG/5ML PO SUSP
30.0000 mL | Freq: Every day | ORAL | Status: DC
Start: 2021-09-12 — End: 2021-09-17
  Administered 2021-09-12 – 2021-09-17 (×5): 30 mL via ORAL
  Filled 2021-09-11 (×6): qty 30

## 2021-09-11 MED ORDER — LABETALOL HCL 5 MG/ML IV SOLN
INTRAVENOUS | Status: DC | PRN
  Administered 2021-09-11 (×3): 5 mg via INTRAVENOUS

## 2021-09-11 MED ORDER — DIPHENHYDRAMINE HCL 50 MG/ML IJ SOLN
INTRAMUSCULAR | Status: DC | PRN
  Administered 2021-09-11: 25 mg via INTRAVENOUS

## 2021-09-11 MED ORDER — VANCOMYCIN HCL IN DEXTROSE 1-5 GM/200ML-% IV SOLN
1000.0000 mg | Freq: Once | INTRAVENOUS | Status: AC
  Administered 2021-09-11: 1000 mg via INTRAVENOUS
  Filled 2021-09-11: qty 200

## 2021-09-11 MED ORDER — SODIUM CHLORIDE 0.9 % IV SOLN: INTRAVENOUS | Status: DC

## 2021-09-11 MED ORDER — KETAMINE HCL 10 MG/ML IJ SOLN
INTRAMUSCULAR | Status: DC | PRN
  Administered 2021-09-11 (×2): 10 mg via INTRAVENOUS

## 2021-09-11 MED ORDER — ONDANSETRON HCL 4 MG/2ML IJ SOLN: 4.0000 mg | Freq: Four times a day (QID) | INTRAMUSCULAR | Status: DC | PRN

## 2021-09-11 MED ORDER — SUCCINYLCHOLINE CHLORIDE 200 MG/10ML IV SOSY
PREFILLED_SYRINGE | INTRAVENOUS | Status: DC | PRN
  Administered 2021-09-11: 100 mg via INTRAVENOUS

## 2021-09-11 MED ORDER — ROCURONIUM BROMIDE 100 MG/10ML IV SOLN
INTRAVENOUS | Status: DC | PRN
  Administered 2021-09-11 (×2): 20 mg via INTRAVENOUS

## 2021-09-11 MED ORDER — HYDROMORPHONE HCL 1 MG/ML IJ SOLN
INTRAMUSCULAR | Status: DC | PRN
  Administered 2021-09-11 (×2): 1 mg via INTRAVENOUS

## 2021-09-11 MED ORDER — ESMOLOL HCL 100 MG/10ML IV SOLN
INTRAVENOUS | Status: DC | PRN
  Administered 2021-09-11 (×2): 20 mg via INTRAVENOUS

## 2021-09-11 MED ORDER — DIPHENHYDRAMINE HCL 12.5 MG/5ML PO ELIX
12.5000 mg | ORAL_SOLUTION | ORAL | Status: DC | PRN
  Administered 2021-09-11 – 2021-09-15 (×6): 25 mg via ORAL
  Filled 2021-09-11 (×6): qty 10

## 2021-09-11 MED ORDER — ESMOLOL HCL 100 MG/10ML IV SOLN
INTRAVENOUS | Status: AC
Start: 2021-09-11 — End: ?
  Filled 2021-09-11: qty 10

## 2021-09-11 MED ORDER — NEOMYCIN-POLYMYXIN B GU 40-200000 IR SOLN
Status: DC | PRN
  Administered 2021-09-11: 14 mL

## 2021-09-11 MED ORDER — ONDANSETRON HCL 4 MG PO TABS: 4.0000 mg | ORAL_TABLET | Freq: Four times a day (QID) | ORAL | Status: DC | PRN

## 2021-09-11 MED ORDER — PHENYLEPHRINE HCL-NACL 20-0.9 MG/250ML-% IV SOLN
INTRAVENOUS | Status: DC | PRN
  Administered 2021-09-11: 10 ug/min via INTRAVENOUS

## 2021-09-11 MED ORDER — GENTAMICIN SULFATE 40 MG/ML IJ SOLN
INTRAMUSCULAR | Status: DC | PRN
  Administered 2021-09-11: 160 mg

## 2021-09-11 MED ORDER — ONDANSETRON HCL 4 MG/2ML IJ SOLN
INTRAMUSCULAR | Status: AC
  Filled 2021-09-11: qty 2

## 2021-09-11 MED ORDER — SEVOFLURANE IN SOLN
RESPIRATORY_TRACT | Status: AC
  Filled 2021-09-11: qty 250

## 2021-09-11 MED ORDER — VANCOMYCIN HCL 1000 MG IV SOLR
INTRAVENOUS | Status: DC | PRN
  Administered 2021-09-11: 1000 mg

## 2021-09-11 MED ORDER — LIDOCAINE HCL (CARDIAC) PF 100 MG/5ML IV SOSY
PREFILLED_SYRINGE | INTRAVENOUS | Status: DC | PRN
  Administered 2021-09-11: 100 mg via INTRAVENOUS

## 2021-09-11 MED ORDER — ALUM & MAG HYDROXIDE-SIMETH 200-200-20 MG/5ML PO SUSP: 30.0000 mL | ORAL | Status: DC | PRN

## 2021-09-11 MED ORDER — PROPOFOL 10 MG/ML IV BOLUS
INTRAVENOUS | Status: DC | PRN
  Administered 2021-09-11: 200 mg via INTRAVENOUS

## 2021-09-11 MED ORDER — CELECOXIB 200 MG PO CAPS
200.0000 mg | ORAL_CAPSULE | Freq: Two times a day (BID) | ORAL | Status: DC
  Administered 2021-09-11 – 2021-09-17 (×13): 200 mg via ORAL
  Filled 2021-09-11 (×16): qty 1

## 2021-09-11 MED ORDER — METOCLOPRAMIDE HCL 5 MG PO TABS
10.0000 mg | ORAL_TABLET | Freq: Three times a day (TID) | ORAL | Status: AC
  Administered 2021-09-11 – 2021-09-13 (×8): 10 mg via ORAL
  Filled 2021-09-11 (×8): qty 2

## 2021-09-11 MED ORDER — MIDAZOLAM HCL 2 MG/2ML IJ SOLN
INTRAMUSCULAR | Status: AC
  Filled 2021-09-11: qty 2

## 2021-09-11 MED ORDER — OXYCODONE HCL 5 MG PO TABS: 5.0000 mg | ORAL_TABLET | ORAL | Status: DC | PRN

## 2021-09-11 MED ORDER — VANCOMYCIN HCL 750 MG/150ML IV SOLN
750.0000 mg | Freq: Once | INTRAVENOUS | Status: AC
  Administered 2021-09-11: 750 mg via INTRAVENOUS
  Filled 2021-09-11: qty 150

## 2021-09-11 MED ORDER — PROPOFOL 1000 MG/100ML IV EMUL
INTRAVENOUS | Status: AC
  Filled 2021-09-11: qty 100

## 2021-09-11 MED ORDER — MIDAZOLAM HCL 2 MG/2ML IJ SOLN
INTRAMUSCULAR | Status: DC | PRN
  Administered 2021-09-11: 2 mg via INTRAVENOUS

## 2021-09-11 MED ORDER — ENOXAPARIN SODIUM 30 MG/0.3ML IJ SOSY
30.0000 mg | PREFILLED_SYRINGE | Freq: Two times a day (BID) | INTRAMUSCULAR | Status: DC
  Administered 2021-09-12 – 2021-09-17 (×11): 30 mg via SUBCUTANEOUS
  Filled 2021-09-11 (×11): qty 0.3

## 2021-09-11 MED ORDER — HYDROMORPHONE HCL 1 MG/ML IJ SOLN: 1.0000 mg | INTRAMUSCULAR | Status: DC | PRN

## 2021-09-11 MED ORDER — LACTATED RINGERS IV SOLN: INTRAVENOUS | Status: DC | PRN

## 2021-09-11 MED ORDER — PROPOFOL 500 MG/50ML IV EMUL
INTRAVENOUS | Status: DC | PRN
  Administered 2021-09-11 (×2): 125 ug/kg/min via INTRAVENOUS

## 2021-09-11 MED ORDER — OXYCODONE HCL 5 MG PO TABS
10.0000 mg | ORAL_TABLET | ORAL | Status: DC | PRN
  Administered 2021-09-11 – 2021-09-17 (×19): 10 mg via ORAL
  Filled 2021-09-11 (×18): qty 2

## 2021-09-11 MED ORDER — PROMETHAZINE HCL 25 MG/ML IJ SOLN: 6.2500 mg | INTRAMUSCULAR | Status: DC | PRN

## 2021-09-11 MED ORDER — ACETAMINOPHEN 10 MG/ML IV SOLN
INTRAVENOUS | Status: DC | PRN
  Administered 2021-09-11: 1000 mg via INTRAVENOUS

## 2021-09-11 MED ORDER — SUGAMMADEX SODIUM 200 MG/2ML IV SOLN
INTRAVENOUS | Status: DC | PRN
  Administered 2021-09-11: 200 mg via INTRAVENOUS

## 2021-09-11 MED ORDER — CEFAZOLIN SODIUM-DEXTROSE 2-4 GM/100ML-% IV SOLN
2.0000 g | Freq: Once | INTRAVENOUS | Status: DC
  Filled 2021-09-11 (×2): qty 100

## 2021-09-11 MED ORDER — 0.9 % SODIUM CHLORIDE (POUR BTL) OPTIME
TOPICAL | Status: DC | PRN
  Administered 2021-09-11: 1000 mL

## 2021-09-11 MED ORDER — CEFAZOLIN SODIUM-DEXTROSE 2-3 GM-%(50ML) IV SOLR
INTRAVENOUS | Status: DC | PRN
  Administered 2021-09-11: 2 g via INTRAVENOUS

## 2021-09-11 MED ORDER — ACETAMINOPHEN 325 MG PO TABS
325.0000 mg | ORAL_TABLET | Freq: Four times a day (QID) | ORAL | Status: DC | PRN
  Administered 2021-09-13 – 2021-09-14 (×3): 650 mg via ORAL
  Filled 2021-09-11 (×3): qty 2

## 2021-09-11 MED ORDER — ACETAMINOPHEN 10 MG/ML IV SOLN
1000.0000 mg | Freq: Four times a day (QID) | INTRAVENOUS | Status: AC
  Administered 2021-09-11 – 2021-09-12 (×4): 1000 mg via INTRAVENOUS
  Filled 2021-09-11 (×4): qty 100

## 2021-09-11 MED ORDER — LABETALOL HCL 5 MG/ML IV SOLN
INTRAVENOUS | Status: AC
  Filled 2021-09-11: qty 4

## 2021-09-11 MED ORDER — VANCOMYCIN HCL 1500 MG/300ML IV SOLN
1500.0000 mg | INTRAVENOUS | Status: DC
Start: 2021-09-12 — End: 2021-09-12
  Administered 2021-09-12: 1500 mg via INTRAVENOUS
  Filled 2021-09-11: qty 300

## 2021-09-11 MED ORDER — PROPOFOL 10 MG/ML IV BOLUS
INTRAVENOUS | Status: AC
  Filled 2021-09-11: qty 20

## 2021-09-11 MED ORDER — BISACODYL 10 MG RE SUPP
10.0000 mg | Freq: Every day | RECTAL | Status: DC | PRN
Start: 2021-09-11 — End: 2021-09-17
  Filled 2021-09-11: qty 1

## 2021-09-11 MED ORDER — CEFAZOLIN SODIUM-DEXTROSE 2-4 GM/100ML-% IV SOLN
2.0000 g | Freq: Four times a day (QID) | INTRAVENOUS | Status: DC
  Administered 2021-09-11 – 2021-09-12 (×5): 2 g via INTRAVENOUS
  Filled 2021-09-11 (×10): qty 100

## 2021-09-11 MED ORDER — DIPHENHYDRAMINE HCL 50 MG/ML IJ SOLN
INTRAMUSCULAR | Status: AC
Start: 2021-09-11 — End: ?
  Filled 2021-09-11: qty 1

## 2021-09-11 SURGICAL SUPPLY — 58 items
CANISTER PREVENA PLUS 150 (CANNISTER) IMPLANT
CANISTER WOUND CARE 500ML ATS (WOUND CARE) IMPLANT
COOLER POLAR GLACIER W/PUMP (MISCELLANEOUS) ×1 IMPLANT
COVER SURGICAL LIGHT HANDLE (MISCELLANEOUS) IMPLANT
CUFF TOURN SGL QUICK 24 (TOURNIQUET CUFF)
CUFF TOURN SGL QUICK 34 (TOURNIQUET CUFF)
CUFF TRNQT CYL 24X4X16.5-23 (TOURNIQUET CUFF) IMPLANT
CUFF TRNQT CYL 34X4.125X (TOURNIQUET CUFF) IMPLANT
DRESSING PEEL AND PLAC PRVNA20 (GAUZE/BANDAGES/DRESSINGS) IMPLANT
DRSG DERMACEA 8X12 NADH (GAUZE/BANDAGES/DRESSINGS) IMPLANT
DRSG DERMACEA NONADH 3X8 (GAUZE/BANDAGES/DRESSINGS) ×1 IMPLANT
DRSG OPSITE POSTOP 4X14 (GAUZE/BANDAGES/DRESSINGS) ×1 IMPLANT
DRSG PEEL AND PLACE PREVENA 20 (GAUZE/BANDAGES/DRESSINGS) ×1
DRSG TEGADERM 4.75X4.75 WINDOW (GAUZE/BANDAGES/DRESSINGS) IMPLANT
DRSG TEGADERM 4X4.75 (GAUZE/BANDAGES/DRESSINGS) IMPLANT
DURAPREP 26ML APPLICATOR (WOUND CARE) ×2 IMPLANT
ELECT REM PT RETURN 9FT ADLT (ELECTROSURGICAL) ×1
ELECTRODE REM PT RTRN 9FT ADLT (ELECTROSURGICAL) ×1 IMPLANT
GLOVE BIOGEL M STRL SZ7.5 (GLOVE) ×2 IMPLANT
GLOVE SURG UNDER LTX SZ8 (GLOVE) ×1 IMPLANT
GOWN STRL REUS W/ TWL LRG LVL3 (GOWN DISPOSABLE) ×2 IMPLANT
GOWN STRL REUS W/TWL LRG LVL3 (GOWN DISPOSABLE) ×2
HANDLE YANKAUER SUCT BULB TIP (MISCELLANEOUS) IMPLANT
HANDPIECE VERSAJET DEBRIDEMENT (MISCELLANEOUS) IMPLANT
HOLDER FOLEY CATH W/STRAP (MISCELLANEOUS) ×1 IMPLANT
INSERT RP TC3 4 10MM (Knees) IMPLANT
INSET RP TC3 4 10MM (Knees) ×1 IMPLANT
IV NS IRRIG 3000ML ARTHROMATIC (IV SOLUTION) ×1 IMPLANT
KIT PUMP PREVENA PLUS 14DAY (MISCELLANEOUS) IMPLANT
KIT STIMULAN RAPID CURE 5CC (Orthopedic Implant) IMPLANT
KIT TURNOVER KIT A (KITS) ×1 IMPLANT
MANIFOLD NEPTUNE II (INSTRUMENTS) ×2 IMPLANT
NDL SAFETY ECLIP 18X1.5 (MISCELLANEOUS) ×1 IMPLANT
NS IRRIG 500ML POUR BTL (IV SOLUTION) ×1 IMPLANT
PACK TOTAL KNEE (MISCELLANEOUS) ×1 IMPLANT
PAD WRAPON POLAR KNEE (MISCELLANEOUS) ×1 IMPLANT
PENCIL SMOKE EVACUATOR (MISCELLANEOUS) ×1 IMPLANT
PULSAVAC PLUS IRRIG FAN TIP (DISPOSABLE) ×1
SOL PREP PVP 2OZ (MISCELLANEOUS) ×1
SOLUTION PREP PVP 2OZ (MISCELLANEOUS) ×1 IMPLANT
SPONGE DRAIN TRACH 4X4 STRL 2S (GAUZE/BANDAGES/DRESSINGS) ×1 IMPLANT
STAPLER SKIN PROX 35W (STAPLE) ×1 IMPLANT
SUCTION FRAZIER HANDLE 10FR (MISCELLANEOUS) ×1
SUCTION TUBE FRAZIER 10FR DISP (MISCELLANEOUS) ×1 IMPLANT
SUT MNCRL AB 4-0 PS2 18 (SUTURE) ×1 IMPLANT
SUT MON AB 2-0 CT1 36 (SUTURE) ×1 IMPLANT
SUT PROLENE 1 CT 1 30 (SUTURE) ×1 IMPLANT
SUT VIC AB 0 CT1 36 (SUTURE) ×1 IMPLANT
SUT VIC AB 1 CT1 36 (SUTURE) ×2 IMPLANT
SUT VIC AB 2-0 CT2 27 (SUTURE) ×1 IMPLANT
SYR 20ML LL LF (SYRINGE) ×1 IMPLANT
SYR 30ML LL (SYRINGE) ×1 IMPLANT
TIP FAN IRRIG PULSAVAC PLUS (DISPOSABLE) ×1 IMPLANT
TOWEL OR 17X26 4PK STRL BLUE (TOWEL DISPOSABLE) ×1 IMPLANT
TRAP FLUID SMOKE EVACUATOR (MISCELLANEOUS) ×1 IMPLANT
TRAY FOLEY MTR SLVR 16FR STAT (SET/KITS/TRAYS/PACK) ×1 IMPLANT
WATER STERILE IRR 500ML POUR (IV SOLUTION) ×1 IMPLANT
WRAPON POLAR PAD KNEE (MISCELLANEOUS) ×1

## 2021-09-11 NOTE — Anesthesia Preprocedure Evaluation (Addendum)
Anesthesia Evaluation  Patient identified by MRN, date of birth, ID band Patient awake    Reviewed: Allergy & Precautions, NPO status , Patient's Chart, lab work & pertinent test results  History of Anesthesia Complications (+) PONV, POST - OP SPINAL HEADACHE and history of anesthetic complications  Airway Mallampati: III  TM Distance: >3 FB Neck ROM: Full    Dental  (+) Upper Dentures, Lower Dentures   Pulmonary neg sleep apnea, Patient abstained from smoking.Not current smoker,    Pulmonary exam normal breath sounds clear to auscultation       Cardiovascular Exercise Tolerance: Good METS(-) hypertension+ Peripheral Vascular Disease  (-) CAD and (-) Past MI (-) dysrhythmias  Rhythm:Regular Rate:Tachycardia + Systolic murmurs    Neuro/Psych  Headaches, PSYCHIATRIC DISORDERS Anxiety Depression Bipolar Disorder neuropathy  Neuromuscular disease    GI/Hepatic GERD  Controlled,(+)     (-) substance abuse  , Tx for hep c   Endo/Other  diabetes, Well Controlled, Type 2Ozempic, 8/28  Renal/GU Renal InsufficiencyRenal disease     Musculoskeletal  (+) Arthritis , On chronic opioids, about '70mg'$  daily oxycodone   Abdominal   Peds  Hematology   Anesthesia Other Findings Pt suspected to have a septic joint.  Past Medical History: No date: Anxiety No date: Arthritis No date: Avascular necrosis of bones of both hips (HCC) No date: Bipolar disorder (HCC) No date: Bladder spasms No date: Chronic pain of left knee No date: Chronic pain syndrome     Comment:  a.) followed by pain clinic 09/09/2015: Cocaine use disorder, moderate, dependence (HCC) No date: DDD (degenerative disc disease) No date: Depression No date: Erectile dysfunction     Comment:  a.) on PDE5i (sildenafil) No date: Frequency-urgency syndrome No date: GERD (gastroesophageal reflux disease) No date: History of hepatitis B     Comment:  1983  TX'D--  NO  ISSUES OR SYMPTOMS SINCE No date: Hypotestosteronemia 09/08/2015: 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[place]) No date: Nocturia No date: Polysubstance abuse ([place])     Comment:  a.) cocaine + marijuana + BZO + opioids No date: PONV (postoperative nausea and vomiting)     Comment:  "difficult to put me to sleep" No date: Spinal headache No date: T2DM (type 2 diabetes mellitus) ([place]) No date: Urine incontinence  Reproductive/Obstetrics                            Anesthesia Physical  Anesthesia Plan  ASA: 2  Anesthesia Plan: General   Post-op Pain Management: Ofirmev IV (intra-op)* and Toradol IV (intra-op)*   Induction: Intravenous and Rapid sequence  PONV Risk Score and Plan: 4 or greater and Ondansetron, Dexamethasone, Midazolam and Treatment may vary due to age or medical condition  Airway Management Planned: Oral ETT  Additional Equipment: None  Intra-op Plan:   Post-operative Plan: Extubation in OR  Informed Consent: I have reviewed the patients History and Physical, chart, labs and discussed the procedure including the risks, benefits and alternatives for the proposed anesthesia with the patient or authorized representative who has indicated his/her understanding and acceptance.     Dental advisory given  Plan Discussed with: CRNA and Surgeon  Anesthesia Plan Comments: (Discussed risks of anesthesia with patient, including PONV, sore throat, lip/dental/eye damage. Rare risks discussed as well, such as cardiorespiratory and neurological sequelae, and allergic reactions. Discussed the role of CRNA in patient's perioperative care. Patient understands.)       Anesthesia Quick Evaluation

## 2021-09-11 NOTE — Op Note (Signed)
OPERATIVE NOTE  DATE OF SURGERY:  09/11/2021  PATIENT NAME:  Erik Martinez   DOB: Sep 06, 1955  MRN: [id number]  PRE-OPERATIVE DIAGNOSIS: Left knee periprosthetic infection  POST-OPERATIVE DIAGNOSIS:  Same  PROCEDURE: Irrigation and debridement of the left knee, polyethylene exchange, and placement of antibiotic impregnated Stimulan beads  SURGEON:  [name], Jr. M.D.  ANESTHESIA: general  ESTIMATED BLOOD LOSS: 50 mL  FLUIDS REPLACED: 1200 mL of crystalloid  TOURNIQUET TIME: 75 minutes  DRAINS: 2 medium Hemovac drains, Prevena wound VAC  IMPLANTS UTILIZED: DePuy Sigma size 4 TC3 10 mm stabilized rotating platform polyethylene insert. 10 cc Stimulan beads mixed with 1000 mg of vancomycin and 240 mg of gentamicin  INDICATIONS FOR SURGERY: Erik Martinez is a 66 y.o. year old male who underwent left knee revision arthroplasty approximately 8 weeks ago.  He had done well and till approximately 3 days ago when he noted the onset of elevated fevers, significant swelling to the left knee, and pain.  Yesterday he had the spontaneous onset of drainage from the superior aspect of the wound.  He did recall some trauma to the knee that seem to correspond with the swelling in pain.  Initial Gram stain demonstrated gram-positive cocci.  After discussion of the risks and benefits of surgical intervention, the patient expressed understanding of the risks benefits and agree with plans for irrigation debridement of the left knee with polyethylene exchange and placement of Stimulan beads.   The risks, benefits, and alternatives were discussed at length including but not limited to the risks of infection, bleeding, nerve injury, stiffness, blood clots, the need for revision surgery, cardiopulmonary complications, among others, and they were willing to proceed.  PROCEDURE IN DETAIL: The patient was brought into the operating room and, after adequate general anesthesia was achieved, a tourniquet was  placed on the patient's upper thigh. The patient's knee and leg were cleaned and prepped with alcohol and DuraPrep and draped in the usual sterile fashion. A "timeout" was performed as per usual protocol. The lower extremity was exsanguinated by elevation, and the tourniquet was inflated to 300 mmHg. An anterior longitudinal incision was made in line with the previous surgical scar.  Swabs were obtained of the superficial lesion and the subcutaneous tissue and submitted for stat Gram stain and cultures.  Moderate amount of purulent fluid was encountered on the superior aspect of the incision.  Tissue planes were developed proximally and distally.  The previous medial parapatellar arthrotomy appeared to have a defect with communication of the material into the joint.  The subcutaneous tissue was debrided using the Versajet.  Next, a medial parapatellar incision was made for visualization of the joint.  Swabs were obtained from the suprapatellar pouch and the medial gutter.  The deep fibers medial collateral ligament were elevated in a subperiosteal fashion.  The patella was everted and the knee was flexed so as to remove the polyethylene.  Swabs were then obtained of the posterior recess and submitted for stat Gram stain and cultures.  A meticulous methodical debridement was performed beginning along the posterior recess and extending along the medial gutter and into the suprapatellar pouch and down the lateral gutter using the Versajet.  This allowed for adequate debridement of soft tissue.  Approximately 6 L of normal saline was utilized during the debridement of the Versajet.  Next, an additional 6 L of fluid was used to irrigate the wound with antibiotic solution using pulsatile lavage.  The fluid was suctioned dry and  the wound was then soaked with 450 mL of surgery for for approximately 10 minutes.  The tourniquet was deflated after total tourniquet time of 75 minutes.  Hemostasis was achieved using  electrocautery.  10 cc of Stimulan beads were mixed with 1000 mg of vancomycin and 240 mg of gentamicin.  The Stimulan beads were then placed in the posterior recess, along the medial lateral gutters, and into the superior subcutaneous recesses.  A size 4 Depuy Sigma TC 310 mm rotating platform polyethylene insert was then placed.  2 medium Hemovac drains were placed wound bed brought through separate stab incision.  The medial parapatellar arthrotomy was repaired using interrupted sutures of #1 Prolene.  Subcutaneous tissue was approximated layers using first #0 Monocryl followed by #2-0 Monocryl.  The skin was closed with skin staples.  A Prevena wound VAC was applied.      Gram stain results from all 4 locations demonstrated gram-positive cocci with 0-6 WBCs per high-power field.                 The patient tolerated the procedure well and was transported to the recovery room in stable condition.    Kaiyan Luczak P. Holley Bouche., M.D.

## 2021-09-11 NOTE — Evaluation (Signed)
Physical Therapy Evaluation Patient Details Name: Erik Martinez MRN: [id number] DOB: 1955-02-27 Today's Date: 09/11/2021  History of Present Illness  Pt is a 66 y/o M admitted on 09/10/21. Pt is s/p L knee revision 07/19/21 & 2 days prior to admission pt tripped & fell on L knee & developed severe pain & swelling followed by sudden onset of drainage. Pt underwent I&D of L knee with polyethylene exchnage & placement of antibiotic beads on 09/12/21 by Dr. Marry Guan.  Clinical Impression  Pt seen for PT evaluation with pt agreeable despite 8/10 pain in L knee. Pt reports he was independent without AD prior to this sx. On this date, pt is able to complete bed mobility with mod I with use of BUE to assist LLE to EOB PRN. Pt is able to complete STS with good awareness of safe hand placement & ambulate around nurses station with RW & CGA fade to supervision. Pt demonstrates decreased heel strike, decreased L knee flexion during gait, and step to pattern. PT provides cuing/education re: step through gait pattern & to decrease BUE shoulder elevation. Pt reports feeling good throughout session. Plan to negotiate stairs tomorrow in preparation for d/c home.        Recommendations for follow up therapy are one component of a multi-disciplinary discharge planning process, led by the attending physician.  Recommendations may be updated based on patient status, additional functional criteria and insurance authorization.  Follow Up Recommendations Home health PT      Assistance Recommended at Discharge PRN  Patient can return home with the following  A little help with walking and/or transfers;A little help with bathing/dressing/bathroom;Assistance with cooking/housework;Assist for transportation;Help with stairs or ramp for entrance    Equipment Recommendations None recommended by PT  Recommendations for Other Services       Functional Status Assessment Patient has had a recent decline in their functional status  and demonstrates the ability to make significant improvements in function in a reasonable and predictable amount of time.     Precautions / Restrictions Precautions Precautions: Fall Restrictions Weight Bearing Restrictions: Yes LLE Weight Bearing: Weight bearing as tolerated      Mobility  Bed Mobility Overal bed mobility: Modified Independent, Needs Assistance Bed Mobility: Supine to Sit     Supine to sit: Modified independent (Device/Increase time), HOB elevated     General bed mobility comments: uses UE to assist LLE to EOB PRN    Transfers Overall transfer level: Needs assistance Equipment used: Rolling walker (2 wheels) Transfers: Sit to/from Stand Sit to Stand: Supervision           General transfer comment: good awareness of safe hand placement    Ambulation/Gait Ambulation/Gait assistance: Supervision, Min guard (CGA fade to supervision) Gait Distance (Feet): 200 Feet Assistive device: Rolling walker (2 wheels) Gait Pattern/deviations: Decreased step length - right, Decreased step length - left, Decreased stride length, Step-to pattern, Decreased dorsiflexion - left Gait velocity: decreased; requires significantly extra time to ambulate 1 lap around nurses station     General Gait Details: minimal L knee flexion throughout gait, PT educates pt on step through pattern but pt continues to demonstrate step-to  Stairs            Wheelchair Mobility    Modified Rankin (Stroke Patients Only)       Balance Overall balance assessment: Needs assistance Sitting-balance support: Feet supported, Bilateral upper extremity supported Sitting balance-Leahy Scale: Good     Standing balance support: Bilateral upper extremity  supported, During functional activity, Single extremity supported Standing balance-Leahy Scale: Good                               Pertinent Vitals/Pain Pain Assessment Pain Assessment: 0-10 Pain Score: 8  Pain  Location: L knee Pain Descriptors / Indicators: Discomfort Pain Intervention(s): Monitored during session, RN gave pain meds during session    Home Living Family/patient expects to be discharged to:: Private residence   Available Help at Discharge: Friend(s);Available PRN/intermittently Type of Home:  (camper) Home Access: Stairs to enter Entrance Stairs-Rails: Left Entrance Stairs-Number of Steps: 1   Home Layout: One level        Prior Function Prior Level of Function : Independent/Modified Independent;Working/employed;Driving             Mobility Comments: Pt reports he had completed PT following last knee surgery, works on building hot rods.       Hand Dominance        Extremity/Trunk Assessment        Lower Extremity Assessment Lower Extremity Assessment: LLE deficits/detail LLE Deficits / Details: decreased flexion ROM 2/2 large dressing on knee required assistance to complete 1st SLR but then able to complete 2nd without assistance with effort, LLE sensation intact to light touch, proprioception intact.       Communication   Communication: No difficulties  Cognition Arousal/Alertness: Awake/alert Behavior During Therapy: WFL for tasks assessed/performed Overall Cognitive Status: Within Functional Limits for tasks assessed                                          General Comments      Exercises     Assessment/Plan    PT Assessment Patient needs continued PT services  PT Problem List Decreased strength;Pain;Decreased range of motion;Decreased activity tolerance;Decreased balance;Decreased mobility       PT Treatment Interventions DME instruction;Therapeutic exercise;Gait training;Balance training;Neuromuscular re-education;Stair training;Modalities;Manual techniques;Therapeutic activities;Patient/family education;Functional mobility training    PT Goals (Current goals can be found in the Care Plan section)  Acute Rehab PT  Goals Patient Stated Goal: return to independence PT Goal Formulation: With patient Time For Goal Achievement: 09/25/21 Potential to Achieve Goals: Good    Frequency BID     Co-evaluation               AM-PAC PT "6 Clicks" Mobility  Outcome Measure Help needed turning from your back to your side while in a flat bed without using bedrails?: None Help needed moving from lying on your back to sitting on the side of a flat bed without using bedrails?: A Little Help needed moving to and from a bed to a chair (including a wheelchair)?: A Little Help needed standing up from a chair using your arms (e.g., wheelchair or bedside chair)?: A Little Help needed to walk in hospital room?: A Little Help needed climbing 3-5 steps with a railing? : A Little 6 Click Score: 19    End of Session   Activity Tolerance: Patient tolerated treatment well Patient left: in chair;with chair alarm set;with call bell/phone within reach (LLE wound vac intact, L polar care donned) Nurse Communication: Mobility status PT Visit Diagnosis: Muscle weakness (generalized) (M62.81);Other abnormalities of gait and mobility (R26.89);Pain Pain - Right/Left: Left Pain - part of body: Knee    Time: 6962-9528 PT Time Calculation (  min) (ACUTE ONLY): 29 min   Charges:   PT Evaluation $PT Eval Low Complexity: 1 Low PT Treatments $Therapeutic Activity: 8-22 mins        Lavone Nian, PT, DPT 09/11/21, 3:05 PM   [name] 09/11/2021, 3:02 PM

## 2021-09-11 NOTE — Progress Notes (Signed)
Patient having purulent drainage from open wound in L knee. Changed kerlix and abd twice due to saturation. Pt maintaining temp 99.9-100.2. MD rounded on patient and made aware of drainage and temp at bedside. ABX on hold until able to obtain cultures in OR per MD. Will continue to medicate for pain and administer IVF per order.

## 2021-09-11 NOTE — Anesthesia Procedure Notes (Signed)
Procedure Name: Intubation Date/Time: 09/11/2021 9:18 AM  Performed by: Garner Nash, CRNAPre-anesthesia Checklist: Patient identified, Emergency Drugs available, Suction available and Patient being monitored Patient Re-evaluated:Patient Re-evaluated prior to induction Oxygen Delivery Method: Circle system utilized Preoxygenation: Pre-oxygenation with 100% oxygen Induction Type: IV induction Laryngoscope Size: Mac and 3 Grade View: Grade I Tube type: Oral Tube size: 7.5 mm Number of attempts: 1 Placement Confirmation: ETT inserted through vocal cords under direct vision, positive ETCO2 and breath sounds checked- equal and bilateral Secured at: 23 cm Tube secured with: Tape Dental Injury: Teeth and Oropharynx as per pre-operative assessment

## 2021-09-11 NOTE — Anesthesia Postprocedure Evaluation (Signed)
Anesthesia Post Note  Patient: Erik Martinez  Procedure(s) Performed: IRRIGATION AND DEBRIDEMENT WITH POLY EXCHANGE LEFT KNEE (Left: Knee) APPLICATION OF WOUND VAC (Left: Knee)  Patient location during evaluation: PACU Anesthesia Type: General Level of consciousness: awake and alert Pain management: pain level controlled Vital Signs Assessment: post-procedure vital signs reviewed and stable Respiratory status: spontaneous breathing, nonlabored ventilation and respiratory function stable Cardiovascular status: blood pressure returned to baseline and stable Postop Assessment: no apparent nausea or vomiting Anesthetic complications: no   No notable events documented.   Last Vitals:  Vitals:   09/11/21 1245 09/11/21 1300  BP: 135/76 133/77  Pulse: 98 98  Resp: 16 18  Temp:    SpO2: 96% 95%    Last Pain:  Vitals:   09/11/21 1235  TempSrc:   PainSc: 0-No pain                 [place] Ouch

## 2021-09-11 NOTE — Consult Note (Signed)
Pharmacy Antibiotic Note  Erik Martinez is a 66 y.o. male admitted on 09/10/2021 with left knee periprosthetic infection wound infection . Wound was irrigated, debrided, exchanged, and packed with antibiotic impregnated beads (VANC/GENT). Pharmacy has been consulted for subsequent vancomycin IV dosing.  Plan: Pt received vancomycin 1g (9/02 1100). Will complete loading dose with vancomycin '750mg'$  IV x1 now (totalling '1750mg'$ ). Followed by Vancomycin IV '1500mg'$  Q24H Goal AUC 400-600  Est AUC: 496.2; Cmax: 34.4; Cmin: 12 SCr 1.53; IBW; Vd 0.72  F/u MRSA PCR ordered & cultures.   Height: '5\' 10"'$  (177.8 cm) Weight: 89.8 kg (198 lb) IBW/kg (Calculated) : 73  Temp (24hrs), Avg:98.9 F (37.2 C), Min:97.6 F (36.4 C), Max:100.2 F (37.9 C)  Recent Labs  Lab 09/10/21 1557  WBC 10.7*  CREATININE 1.53*    Estimated Creatinine Clearance: 55 mL/min (A) (by C-G formula based on SCr of 1.53 mg/dL (H)).    Allergies  Allergen Reactions   Suboxone [Buprenorphine Hcl-Naloxone Hcl] Nausea And Vomiting   Tape Other (See Comments)    Whelps *Paper Tape is ok    Codeine Rash, Itching and Nausea Only   Morphine And Related Itching and Rash   Other Rash    Telemetry electrodes   Silicone Rash    Whelps -*Paper Tape is ok     Antimicrobials this admission: Ancef 2g x1 in OR (9/02) Vancomycin (9/2 >>   Dose adjustments this admission: CTM and adjust PRN.  Microbiology results: 9/02 surgical deep wound (knee posterior recess; left): GPC, rare WBC  9/02 surgical deep wound (knee posterior gutter; left): GPC, rare WBC  9/02 surgical deep wound (knee suprapatella pouch; left): GPC, rare WBC  9/02 surgical deep wound (knee left subcutaneous tissue; left): GPC, rare WBC  9/02 MRSA PCR: negative 9/02 Staphylococcus aureus PCR: positive  Thank you for allowing pharmacy to be a part of this patient's care.  Shanon Brow Shaynah Hund 09/11/2021 2:09 PM

## 2021-09-11 NOTE — Progress Notes (Signed)
   09/11/21 0804  Assess: MEWS Score  Temp 98.7 F (37.1 C)  BP (!) 147/71  MAP (mmHg) 93  Pulse Rate (!) 120  Resp 16  SpO2 95 %  O2 Device Room Air  Assess: MEWS Score  MEWS Temp 0  MEWS Systolic 0  MEWS Pulse 2  MEWS RR 0  MEWS LOC 0  MEWS Score 2  MEWS Score Color Yellow  Assess: if the MEWS score is Yellow or Red  Were vital signs taken at a resting state? Yes  Focused Assessment Change from prior assessment (see assessment flowsheet)  Does the patient meet 2 or more of the SIRS criteria? Yes  Does the patient have a confirmed or suspected source of infection? Yes  Provider and Rapid Response Notified? Yes  MEWS guidelines implemented *See Row Information* No, other (Comment) (Pt to OR. OR staff aware)  Notify: Charge Nurse/RN  Name of Charge Nurse/RN Notified Bre RN  Date Charge Nurse/RN Notified 09/11/21  Time Charge Nurse/RN Notified 1660  Notify: Provider  Provider Name/Title Tamala Julian PA  Date Provider Notified 09/11/21  Time Provider Notified [phone]  Method of Notification Page  Notification Reason Other (Comment) (elevated HR. Notified OR charge Nurse and Surgeon's PA)  Provider response Other (Comment) (Pending. OR charge Nurse and Ortho surgeon's PA aware.)  Date of Provider Response 09/11/21  Time of Provider Response [phone] (OR charge Nurse aware and notified ortho surgeon's PA)  Document  Patient Outcome Other (Comment) (Pt to the OR)  Progress note created (see row info) Yes  Assess: SIRS CRITERIA  SIRS Temperature  0  SIRS Pulse 1  SIRS Respirations  0  SIRS WBC 1  SIRS Score Sum  2

## 2021-09-11 NOTE — Plan of Care (Signed)

## 2021-09-11 NOTE — Transfer of Care (Addendum)
Immediate Anesthesia Transfer of Care Note  Patient: Erik Martinez  Procedure(s) Performed: IRRIGATION AND DEBRIDEMENT WITH POLY EXCHANGE LEFT KNEE (Left: Knee) APPLICATION OF WOUND VAC (Left: Knee)  Patient Location: PACU  Anesthesia Type:General  Level of Consciousness: awake  Airway & Oxygen Therapy: Patient connected to nasal cannula oxygen  Post-op Assessment: Report given to RN  Post vital signs: stable  Last Vitals:  Vitals Value Taken Time  BP 136/72 09/11/21 1235  Temp 37   Pulse 98 09/11/21 1241  Resp 12 09/11/21 1241  SpO2 96 % 09/11/21 1241  Vitals shown include unvalidated device data.  Last Pain:  Vitals:   09/11/21 0807  TempSrc:   PainSc: 6       Patients Stated Pain Goal: 4 (14/43/15 4008)  Complications: No notable events documented.

## 2021-09-12 LAB — CREATININE, SERUM
Creatinine, Ser: 1.38 mg/dL — ABNORMAL HIGH (ref 0.61–1.24)
GFR, Estimated: 57 mL/min — ABNORMAL LOW (ref >60)

## 2021-09-12 LAB — GLUCOSE, CAPILLARY
Glucose-Capillary: 189 mg/dL — ABNORMAL HIGH (ref 70–99)
Glucose-Capillary: 182 mg/dL — ABNORMAL HIGH (ref 70–99)
Glucose-Capillary: 178 mg/dL — ABNORMAL HIGH (ref 70–99)
Glucose-Capillary: 175 mg/dL — ABNORMAL HIGH (ref 70–99)

## 2021-09-12 MED ORDER — CEFAZOLIN SODIUM-DEXTROSE 2-4 GM/100ML-% IV SOLN: 2.0000 g | Freq: Three times a day (TID) | INTRAVENOUS | Status: DC

## 2021-09-12 MED ORDER — VANCOMYCIN HCL 1750 MG/350ML IV SOLN
1750.0000 mg | INTRAVENOUS | Status: DC
  Administered 2021-09-13: 1750 mg via INTRAVENOUS
  Filled 2021-09-12: qty 350

## 2021-09-12 NOTE — Progress Notes (Signed)
Physical Therapy Treatment Patient Details Name: Erik Martinez MRN: [id number] DOB: 27-Dec-1955 Today's Date: 09/12/2021   History of Present Illness Pt is a 66 y/o M admitted on 09/10/21. Pt is s/p L knee revision 07/19/21 & 2 days prior to admission pt tripped & fell on L knee & developed severe pain & swelling followed by sudden onset of drainage. Pt underwent I&D of L knee with polyethylene exchnage & placement of antibiotic beads on 09/12/21 by Dr. Marry Guan.    PT Comments    Pt seen for PT tx with pt agreeable. PA in room & removed L knee drain & pt with c/o pain afterwards - monitored during session & limited activity to pt's tolerance. Pt with decreased L knee flexion (52 degrees) as pt is limited by pain. Pt also requires min assist for supine>sit to move LLE to EOB 2/2 pain & weakness on this date. Pt is able to complete STS with supervision & ambulate 1 lap around nurses station with slightly increased gait speed compared to yesterday & progressing to step through pattern. Pt negotiates 4 steps with B rails & supervision although pt reports he does have a ramp to access his camper. Pt is making good progress towards PT goals. Will continue to follow pt acutely to address L knee ROM & strengthening, balance & gait with LRAD.    Recommendations for follow up therapy are one component of a multi-disciplinary discharge planning process, led by the attending physician.  Recommendations may be updated based on patient status, additional functional criteria and insurance authorization.  Follow Up Recommendations  Home health PT     Assistance Recommended at Discharge PRN  Patient can return home with the following A little help with walking and/or transfers;A little help with bathing/dressing/bathroom;Assistance with cooking/housework;Assist for transportation;Help with stairs or ramp for entrance   Equipment Recommendations  None recommended by PT    Recommendations for Other Services        Precautions / Restrictions Precautions Precautions: Fall Restrictions Weight Bearing Restrictions: Yes LLE Weight Bearing: Weight bearing as tolerated     Mobility  Bed Mobility Overal bed mobility: Needs Assistance Bed Mobility: Supine to Sit     Supine to sit: Min assist     General bed mobility comments: min assist to move LLE to EOB    Transfers Overall transfer level: Needs assistance Equipment used: Rolling walker (2 wheels) Transfers: Sit to/from Stand Sit to Stand: Supervision                Ambulation/Gait Ambulation/Gait assistance: Modified independent (Device/Increase time) Gait Distance (Feet): 200 Feet Assistive device: Rolling walker (2 wheels)   Gait velocity: decreased     General Gait Details: Pt with good demo of step through, initially requires education to push RW vs picking it up. Decreased L knee flexion during swing phase. Educated pt on need to focus on heel strike as well.   Stairs Stairs: Yes Stairs assistance: Supervision Stair Management: Two rails, Step to pattern Number of Stairs: 4 General stair comments: min educational cuing re: compensatory pattern   Wheelchair Mobility    Modified Rankin (Stroke Patients Only)       Balance Overall balance assessment: Needs assistance Sitting-balance support: Feet supported Sitting balance-Leahy Scale: Good     Standing balance support: During functional activity, Bilateral upper extremity supported Standing balance-Leahy Scale: Good  Cognition Arousal/Alertness: Awake/alert Behavior During Therapy: WFL for tasks assessed/performed Overall Cognitive Status: Within Functional Limits for tasks assessed                                          Exercises Total Joint Exercises Goniometric ROM: L knee flexion 52 degrees    General Comments        Pertinent Vitals/Pain Pain Assessment Pain Assessment:  Faces Faces Pain Scale: Hurts even more Pain Location: L knee Pain Descriptors / Indicators: Discomfort Pain Intervention(s): Monitored during session    Home Living                          Prior Function            PT Goals (current goals can now be found in the care plan section) Acute Rehab PT Goals Patient Stated Goal: return to independence PT Goal Formulation: With patient Time For Goal Achievement: 09/25/21 Potential to Achieve Goals: Good Progress towards PT goals: Progressing toward goals    Frequency    BID      PT Plan Current plan remains appropriate    Co-evaluation              AM-PAC PT "6 Clicks" Mobility   Outcome Measure  Help needed turning from your back to your side while in a flat bed without using bedrails?: None Help needed moving from lying on your back to sitting on the side of a flat bed without using bedrails?: A Little Help needed moving to and from a bed to a chair (including a wheelchair)?: A Little Help needed standing up from a chair using your arms (e.g., wheelchair or bedside chair)?: A Little Help needed to walk in hospital room?: None Help needed climbing 3-5 steps with a railing? : A Little 6 Click Score: 20    End of Session   Activity Tolerance: Patient tolerated treatment well Patient left: in chair;with call bell/phone within reach   PT Visit Diagnosis: Muscle weakness (generalized) (M62.81);Other abnormalities of gait and mobility (R26.89);Pain Pain - Right/Left: Left Pain - part of body: Knee     Time: 0935-1000 PT Time Calculation (min) (ACUTE ONLY): 25 min  Charges:  $Therapeutic Activity: 23-37 mins                     Erik Martinez, PT, DPT 09/12/21, 10:09 AM    [name] 09/12/2021, 10:07 AM

## 2021-09-12 NOTE — Evaluation (Signed)
Occupational Therapy Evaluation Patient Details Name: Erik Martinez MRN: [id number] DOB: 08-11-1955 Today's Date: 09/12/2021   History of Present Illness Pt is a 66 y/o M admitted on 09/10/21. Pt is s/p L knee revision 07/19/21 & 2 days prior to admission pt tripped & fell on L knee & developed severe pain & swelling followed by sudden onset of drainage. Pt underwent I&D of L knee with polyethylene exchnage & placement of antibiotic beads on 09/12/21 by Dr. Marry Guan.   Clinical Impression   Patient seen for OT evaluation. Patient presenting with L knee pain, decreased strength, and decreased endurance impacting safety and independence in ADLs. Patient was independent with ADLs/IADLs, working, and driving prior to admission. Patient currently functioning at supervision for STS from recliner and functional mobility within the room using RW. Patient currently requiring Max A for LB dressing and set up-supervision for standing grooming tasks. Patient was educated on LLE precautions including WBAT, no pillow under L knee, polar care system, and use of LB dressing AE (reacher, sock aid). Patient will benefit from acute OT to increase overall independence in the areas of ADLs and functional mobility in order to safely discharge home. Patient could benefit from [facility] following D/C to decrease falls risk, improve balance, and maximize independence in self-care within own home environment.     Recommendations for follow up therapy are one component of a multi-disciplinary discharge planning process, led by the attending physician.  Recommendations may be updated based on patient status, additional functional criteria and insurance authorization.   Follow Up Recommendations  Home health OT    Assistance Recommended at Discharge PRN  Patient can return home with the following A little help with walking and/or transfers;A little help with bathing/dressing/bathroom;Assistance with cooking/housework;Assist for  transportation;Help with stairs or ramp for entrance    Functional Status Assessment  Patient has had a recent decline in their functional status and demonstrates the ability to make significant improvements in function in a reasonable and predictable amount of time.  Equipment Recommendations  Other (comment) (discussed LB dressing AE - showed how to order reacher, sock aid)    Recommendations for Other Services       Precautions / Restrictions Precautions Precautions: Fall Restrictions Weight Bearing Restrictions: Yes LLE Weight Bearing: Weight bearing as tolerated      Mobility Bed Mobility               General bed mobility comments: received and left in recliner    Transfers Overall transfer level: Needs assistance Equipment used: Rolling walker (2 wheels) Transfers: Sit to/from Stand Sit to Stand: Supervision                  Balance Overall balance assessment: Needs assistance Sitting-balance support: Feet supported Sitting balance-Leahy Scale: Good     Standing balance support: During functional activity, Bilateral upper extremity supported Standing balance-Leahy Scale: Good                             ADL either performed or assessed with clinical judgement   ADL Overall ADL's : Needs assistance/impaired Eating/Feeding: Set up   Grooming: Oral care;Wash/dry hands;Standing;Supervision/safety;Set up Grooming Details (indicate cue type and reason): Pt completed sinkside grooming >5 minutes             Lower Body Dressing: Maximal assistance   Toilet Transfer: Supervision/safety;Rolling walker (2 wheels) Toilet Transfer Details (indicate cue type and reason): simulated with sit <>  stand from Midwife and Hygiene: Supervision/safety Toileting - Clothing Manipulation Details (indicate cue type and reason): use of urinal in standing with supervision     Functional mobility during ADLs:  Supervision/safety;Rolling walker (2 wheels) (at room level)       Vision Baseline Vision/History: 1 Wears glasses Patient Visual Report: No change from baseline       Perception     Praxis      Pertinent Vitals/Pain Pain Assessment Pain Assessment: 0-10 Pain Score: 8  Pain Location: L knee Pain Descriptors / Indicators: Discomfort, Grimacing Pain Intervention(s): Monitored during session, Ice applied, Repositioned     Hand Dominance Right   Extremity/Trunk Assessment Upper Extremity Assessment Upper Extremity Assessment: Overall WFL for tasks assessed   Lower Extremity Assessment Lower Extremity Assessment: Defer to PT evaluation       Communication Communication Communication: No difficulties   Cognition Arousal/Alertness: Awake/alert Behavior During Therapy: WFL for tasks assessed/performed Overall Cognitive Status: Within Functional Limits for tasks assessed                                       General Comments       Exercises     Shoulder Instructions      Home Living Family/patient expects to be discharged to:: Private residence Living Arrangements: Alone Available Help at Discharge: Friend(s);Available PRN/intermittently Type of Home:  (camper) Home Access: Stairs to enter Entrance Stairs-Number of Steps: 1 Entrance Stairs-Rails: Left Home Layout: One level     Bathroom Shower/Tub: Teacher, early years/pre: Handicapped height     Home Equipment: Toilet riser   Additional Comments: Pt is able to use shower with TTB in barn that is nearby      Prior Functioning/Environment Prior Level of Function : Independent/Modified Independent;Working/employed;Driving             Mobility Comments: Pt reports he had completed PT following last knee surgery, works on building hot rods. ADLs Comments: IND with ADLs/IADLs        OT Problem List: Decreased strength;Decreased range of motion;Decreased activity  tolerance;Impaired balance (sitting and/or standing);Pain;Decreased knowledge of use of DME or AE      OT Treatment/Interventions: Self-care/ADL training;Patient/family education;Therapeutic exercise;Balance training;Energy conservation;Therapeutic activities;DME and/or AE instruction    OT Goals(Current goals can be found in the care plan section) Acute Rehab OT Goals Patient Stated Goal: to get stronger OT Goal Formulation: With patient Time For Goal Achievement: 09/26/21 Potential to Achieve Goals: Good ADL Goals Pt Will Perform Lower Body Dressing: Independently;with adaptive equipment;sit to/from stand Pt Will Transfer to Toilet: Independently;ambulating;bedside commode Pt Will Perform Toileting - Clothing Manipulation and hygiene: Independently;sit to/from stand  OT Frequency: Min 2X/week    Co-evaluation              AM-PAC OT "6 Clicks" Daily Activity     Outcome Measure Help from another person eating meals?: None Help from another person taking care of personal grooming?: None Help from another person toileting, which includes using toliet, bedpan, or urinal?: A Little Help from another person bathing (including washing, rinsing, drying)?: A Lot Help from another person to put on and taking off regular upper body clothing?: None Help from another person to put on and taking off regular lower body clothing?: A Lot 6 Click Score: 19   End of Session Equipment Utilized During Treatment: Gait belt;Rolling walker (2  wheels) Nurse Communication: Mobility status  Activity Tolerance: Patient tolerated treatment well;Patient limited by pain Patient left: in chair;with call bell/phone within reach  OT Visit Diagnosis: Other abnormalities of gait and mobility (R26.89);Muscle weakness (generalized) (M62.81);Pain Pain - Right/Left: Left Pain - part of body: Knee                Time: 1001-1043 OT Time Calculation (min): 42 min Charges:  OT General Charges $OT Visit: 1  Visit OT Evaluation $OT Eval Low Complexity: 1 Low OT Treatments $Self Care/Home Management : 8-22 mins  [facility] MS, OTR/L ascom [phone]  09/12/21, 2:10 PM

## 2021-09-12 NOTE — Progress Notes (Addendum)
   Subjective: 1 Day Post-Op Procedure(s) (LRB): IRRIGATION AND DEBRIDEMENT WITH POLY EXCHANGE LEFT KNEE (Left) APPLICATION OF WOUND VAC (Left) Patient reports pain as moderate.   Patient is well, and has had no acute complaints or problems Denies any CP, SOB, ABD pain. We will continue therapy today.  Plan is to go Home after hospital stay.  Objective: Vital signs in last 24 hours: Temp:  [97.6 F (36.4 C)-98.9 F (37.2 C)] 98.5 F (36.9 C) (09/03 0843) Pulse Rate:  [95-99] 99 (09/03 0843) Resp:  [9-18] 17 (09/03 0843) BP: (130-151)/(70-85) 139/75 (09/03 0843) SpO2:  [94 %-100 %] 98 % (09/03 0843)  Intake/Output from previous day: 09/02 0701 - 09/03 0700 In: 1987.7 [P.O.:100; I.V.:1487.7; IV Piggyback:400] Out: 7341 [Urine:1400; Drains:120; Blood:50] Intake/Output this shift: No intake/output data recorded.  Recent Labs    09/10/21 1557  HGB 13.0   Recent Labs    09/10/21 1557  WBC 10.7*  RBC 5.11  HCT 40.2  PLT 165   Recent Labs    09/10/21 1557  NA 133*  K 3.6  CL 101  CO2 24  BUN 21  CREATININE 1.53*  GLUCOSE 191*  CALCIUM 8.9   No results for input(s): "LABPT", "INR" in the last 72 hours.  EXAM General - Patient is Alert, Appropriate, and Oriented Extremity - Neurovascular intact Sensation intact distally Intact pulses distally Dorsiflexion/Plantar flexion intact Dressing - dressing C/D/I, hemovac intact and removed. Provena intact with out drainage Motor Function - intact, moving foot and toes well on exam. 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   Weight as of this encounter: 89.8 kg. Advance diet Up with therapy VSS Pain controlled ID consult pending - Continue with IV abx. Cultures growing gram positive cocci. Sensitivities pending CM to assist with discharge to home with HHPT most likely Monday or Tuesday.   DVT Prophylaxis - Lovenox, TED hose, and SCDS Weight-Bearing as tolerated to left leg   T. Rachelle Hora, PA-C [place] 09/12/2021, 9:36 AM

## 2021-09-12 NOTE — Plan of Care (Signed)
  Problem: Education: Goal: Knowledge of General Education information will improve Description: Including pain rating scale, medication(s)/side effects and non-pharmacologic comfort measures Outcome: Progressing   Problem: Health Behavior/Discharge Planning: Goal: Ability to manage health-related needs will improve Outcome: Progressing   Problem: Clinical Measurements: Goal: Ability to maintain clinical measurements within normal limits will improve Outcome: Progressing Goal: Will remain free from infection Outcome: Progressing Goal: Diagnostic test results will improve Outcome: Progressing Goal: Respiratory complications will improve Outcome: Progressing Goal: Cardiovascular complication will be avoided Outcome: Progressing   Problem: Activity: Goal: Risk for activity intolerance will decrease Outcome: Progressing   Problem: Nutrition: Goal: Adequate nutrition will be maintained Outcome: Progressing   Problem: Coping: Goal: Level of anxiety will decrease Outcome: Progressing   Problem: Elimination: Goal: Will not experience complications related to bowel motility Outcome: Progressing Goal: Will not experience complications related to urinary retention Outcome: Progressing   Problem: Pain Managment: Goal: General experience of comfort will improve Outcome: Progressing   Problem: Safety: Goal: Ability to remain free from injury will improve Outcome: Progressing   Problem: Skin Integrity: Goal: Risk for impaired skin integrity will decrease Outcome: Progressing   Problem: Education: Goal: Ability to describe self-care measures that may prevent or decrease complications (Diabetes Survival Skills Education) will improve Outcome: Progressing Goal: Individualized Educational Video(s) Outcome: Progressing   Problem: Coping: Goal: Ability to adjust to condition or change in health will improve Outcome: Progressing   Problem: Fluid Volume: Goal: Ability to  maintain a balanced intake and output will improve Outcome: Progressing   Problem: Health Behavior/Discharge Planning: Goal: Ability to identify and utilize available resources and services will improve Outcome: Progressing Goal: Ability to manage health-related needs will improve Outcome: Progressing   Problem: Metabolic: Goal: Ability to maintain appropriate glucose levels will improve Outcome: Progressing   Problem: Nutritional: Goal: Maintenance of adequate nutrition will improve Outcome: Progressing Goal: Progress toward achieving an optimal weight will improve Outcome: Progressing   Problem: Skin Integrity: Goal: Risk for impaired skin integrity will decrease Outcome: Progressing   Problem: Tissue Perfusion: Goal: Adequacy of tissue perfusion will improve Outcome: Progressing   Problem: Education: Goal: Knowledge of the prescribed therapeutic regimen will improve Outcome: Progressing Goal: Individualized Educational Video(s) Outcome: Progressing   Problem: Activity: Goal: Ability to avoid complications of mobility impairment will improve Outcome: Progressing Goal: Range of joint motion will improve Outcome: Progressing   Problem: Clinical Measurements: Goal: Postoperative complications will be avoided or minimized Outcome: Progressing   Problem: Pain Management: Goal: Pain level will decrease with appropriate interventions Outcome: Progressing   Problem: Skin Integrity: Goal: Will show signs of wound healing Outcome: Progressing

## 2021-09-12 NOTE — Consult Note (Addendum)
Pharmacy Antibiotic Note  Erik Martinez is a 66 y.o. male admitted on 09/10/2021 with left knee periprosthetic infection wound infection . Wound was irrigated, debrided, exchanged, and packed with antibiotic impregnated beads (VANC/GENT). Pharmacy has been consulted for subsequent vancomycin IV dosing.  -knee wound cx: Staph aureus, susceptibilities pending   Plan: Scr improved 1.53>>1.38 Adjust Vancomycin IV from '1500mg'$  to '1750mg'$   Q24H Goal AUC 400-600  Est AUC: 533; Cmin: 12.6 SCr 1.38; IBW; Vd 0.72  F/u wound cx: Staph aureus   Height: '5\' 10"'$  (177.8 cm) Weight: 89.8 kg (198 lb) IBW/kg (Calculated) : 73  Temp (24hrs), Avg:98.4 F (36.9 C), Min:98.1 F (36.7 C), Max:98.9 F (37.2 C)  Recent Labs  Lab 09/10/21 1557 09/12/21 0925  WBC 10.7*  --   CREATININE 1.53* 1.38*     Estimated Creatinine Clearance: 61 mL/min (A) (by C-G formula based on SCr of 1.38 mg/dL (H)).    Allergies  Allergen Reactions   Suboxone [Buprenorphine Hcl-Naloxone Hcl] Nausea And Vomiting   Tape Other (See Comments)    Whelps *Paper Tape is ok    Codeine Rash, Itching and Nausea Only   Morphine And Related Itching and Rash   Other Rash    Telemetry electrodes   Silicone Rash    Whelps -*Paper Tape is ok     Antimicrobials this admission: Ancef  (9/02)>>9/3 Terbinafine 9/2>> Vancomycin (9/2 >>   Dose adjustments this admission: CTM and adjust PRN.  Microbiology results: 9/02 surgical deep wound (knee posterior recess; left): GPC, rare WBC  9/02 surgical deep wound (knee posterior gutter; left): GPC, rare WBC  9/02 surgical deep wound (knee suprapatella pouch; left): GPC, rare WBC  9/02 surgical deep wound (knee left subcutaneous tissue; left): GPC, rare WBC  9/02 MRSA PCR: negative 9/02 Staphylococcus aureus PCR: positive  Thank you for allowing pharmacy to be a part of this patient's care.  Milliana Reddoch A 09/12/2021 3:53 PM

## 2021-09-13 LAB — GLUCOSE, CAPILLARY
Glucose-Capillary: 174 mg/dL — ABNORMAL HIGH (ref 70–99)
Glucose-Capillary: 191 mg/dL — ABNORMAL HIGH (ref 70–99)
Glucose-Capillary: 134 mg/dL — ABNORMAL HIGH (ref 70–99)
Glucose-Capillary: 323 mg/dL — ABNORMAL HIGH (ref 70–99)

## 2021-09-13 LAB — CREATININE, SERUM
Creatinine, Ser: 1.48 mg/dL — ABNORMAL HIGH (ref 0.61–1.24)
GFR, Estimated: 53 mL/min — ABNORMAL LOW (ref >60)

## 2021-09-13 MED ORDER — ATENOLOL 25 MG PO TABS
50.0000 mg | ORAL_TABLET | Freq: Two times a day (BID) | ORAL | Status: DC
  Administered 2021-09-13 – 2021-09-17 (×8): 50 mg via ORAL
  Filled 2021-09-13 (×8): qty 2

## 2021-09-13 MED ORDER — CEFAZOLIN SODIUM-DEXTROSE 2-4 GM/100ML-% IV SOLN
2.0000 g | Freq: Three times a day (TID) | INTRAVENOUS | Status: DC
  Administered 2021-09-13 – 2021-09-14 (×4): 2 g via INTRAVENOUS
  Filled 2021-09-13 (×4): qty 100

## 2021-09-13 MED ORDER — CLONIDINE HCL 0.1 MG PO TABS
0.2000 mg | ORAL_TABLET | Freq: Two times a day (BID) | ORAL | Status: DC
  Administered 2021-09-13 – 2021-09-17 (×8): 0.2 mg via ORAL
  Filled 2021-09-13 (×8): qty 2

## 2021-09-13 MED ORDER — CEFAZOLIN SODIUM-DEXTROSE 2-4 GM/100ML-% IV SOLN: 2.0000 g | Freq: Three times a day (TID) | INTRAVENOUS | Status: DC

## 2021-09-13 NOTE — Progress Notes (Addendum)
Cultures back with sensitivities consistent with MSSA..  Dr. Delaine Lame not available until tomorrow. I communicated with Dr. Prudencio Pair who recommended changing to Ancef. Anticipate need for long term IV antibiotics.  Hanna Ra P. Holley Bouche M.D.

## 2021-09-13 NOTE — Progress Notes (Signed)
   09/13/21 0121  Assess: MEWS Score  Temp 99.2 F (37.3 C)  BP 123/69  MAP (mmHg) 84  Pulse Rate (!) 123  Resp 18  SpO2 97 %  O2 Device Room Air  Assess: MEWS Score  MEWS Temp 0  MEWS Systolic 0  MEWS Pulse 2  MEWS RR 0  MEWS LOC 0  MEWS Score 2  MEWS Score Color Yellow  Assess: if the MEWS score is Yellow or Red  Were vital signs taken at a resting state? Yes  Focused Assessment Change from prior assessment (see assessment flowsheet)  Does the patient meet 2 or more of the SIRS criteria? Yes  Does the patient have a confirmed or suspected source of infection? Yes  Provider and Rapid Response Notified? Yes  MEWS guidelines implemented *See Row Information* Yes  Notify: Charge Nurse/RN  Name of Charge Nurse/RN Notified Dorie  Date Charge Nurse/RN Notified 09/13/21  Time Charge Nurse/RN Notified 0130  Notify: Provider  Provider Name/Title Jon Billings  Date Provider Notified 09/13/21  Time Provider Notified 0132  Method of Notification Page  Notification Reason Other (Comment) (temp and HR)  Provider response No new orders  Document  Patient Outcome Stabilized after interventions  Progress note created (see row info) Yes  Assess: SIRS CRITERIA  SIRS Temperature  0  SIRS Pulse 1  SIRS Respirations  0  SIRS WBC 1  SIRS Score Sum  2

## 2021-09-13 NOTE — Progress Notes (Signed)
Physical Therapy Treatment Patient Details Name: Erik Martinez MRN: [id number] DOB: 1955/12/14 Today's Date: 09/13/2021   History of Present Illness Pt is a 66 y/o M admitted on 09/10/21. Pt is s/p L knee revision 07/19/21 & 2 days prior to admission pt tripped & fell on L knee & developed severe pain & swelling followed by sudden onset of drainage. Pt underwent I&D of L knee with polyethylene exchnage & placement of antibiotic beads on 09/12/21 by Dr. Marry Guan.    PT Comments    Pt tachy today per vitals in chart, 122bpm seated in chair on entry and 139spm after several minutes fo walking. Pt's pain remains high, hence Pryor Curia asks pt to prioritize how he will spend his time this session, pt elects to focus on walking. Antalgia dominates all movements leaving little room for alternate strategies or training. Pt having lots of soreness/pain in left quads area. Polar care noted to be not working, Chief Strategy Officer obtained a new wall adapter which fixed this problem. Also provided an additional ice pack for quads area not covered by polar care and called for additional pain meds pt in chair at end of session.   Recommendations for follow up therapy are one component of a multi-disciplinary discharge planning process, led by the attending physician.  Recommendations may be updated based on patient status, additional functional criteria and insurance authorization.  Follow Up Recommendations  Home health PT     Assistance Recommended at Discharge PRN  Patient can return home with the following A little help with walking and/or transfers;A little help with bathing/dressing/bathroom;Assistance with cooking/housework;Assist for transportation;Help with stairs or ramp for entrance   Equipment Recommendations  None recommended by PT    Recommendations for Other Services       Precautions / Restrictions Precautions Precautions: Fall Restrictions Weight Bearing Restrictions: Yes LLE Weight Bearing: Weight bearing  as tolerated     Mobility  Bed Mobility Overal bed mobility: Needs Assistance Bed Mobility: Supine to Sit     Supine to sit: Min assist     General bed mobility comments: received and left in recliner    Transfers Overall transfer level: Needs assistance Equipment used: Rolling walker (2 wheels) Transfers: Sit to/from Stand Sit to Stand: Supervision                Ambulation/Gait Ambulation/Gait assistance: Modified independent (Device/Increase time) Gait Distance (Feet): 240 Feet Assistive device: Rolling walker (2 wheels) Gait Pattern/deviations: Step-to pattern Gait velocity: 0.30ms     General Gait Details: 3-point step-to gait, pattern dominated by abntalgia with LLE locked knee swing phase.   Stairs             Wheelchair Mobility    Modified Rankin (Stroke Patients Only)       Balance                                            Cognition Arousal/Alertness: Awake/alert Behavior During Therapy: WFL for tasks assessed/performed Overall Cognitive Status: Within Functional Limits for tasks assessed                                          Exercises      General Comments        Pertinent Vitals/Pain Pain Assessment Pain  Assessment: 0-10 Pain Score: 8  Pain Location: Left quads/ mid thigh Pain Descriptors / Indicators: Discomfort, Grimacing Pain Intervention(s): Limited activity within patient's tolerance, Monitored during session, Premedicated before session, Patient requesting pain meds-RN notified, Ice applied, RN gave pain meds during session    Home Living                          Prior Function            PT Goals (current goals can now be found in the care plan section) Acute Rehab PT Goals Patient Stated Goal: return to independence PT Goal Formulation: With patient Time For Goal Achievement: 09/25/21 Potential to Achieve Goals: Good Progress towards PT goals: Progressing  toward goals    Frequency    BID      PT Plan Current plan remains appropriate    Co-evaluation              AM-PAC PT "6 Clicks" Mobility   Outcome Measure  Help needed turning from your back to your side while in a flat bed without using bedrails?: None Help needed moving from lying on your back to sitting on the side of a flat bed without using bedrails?: A Little Help needed moving to and from a bed to a chair (including a wheelchair)?: A Little Help needed standing up from a chair using your arms (e.g., wheelchair or bedside chair)?: A Little Help needed to walk in hospital room?: None Help needed climbing 3-5 steps with a railing? : A Little 6 Click Score: 20    End of Session Equipment Utilized During Treatment: Gait belt Activity Tolerance: Patient tolerated treatment well;Patient limited by pain Patient left: in chair;with call bell/phone within reach;with nursing/sitter in room Nurse Communication: Mobility status PT Visit Diagnosis: Muscle weakness (generalized) (M62.81);Other abnormalities of gait and mobility (R26.89);Pain Pain - Right/Left: Left     Time: 1610-9604 PT Time Calculation (min) (ACUTE ONLY): 35 min  Charges:  $Gait Training: 8-22 mins $Therapeutic Activity: 8-22 mins                    10:33 AM, 09/13/21 Etta Grandchild, PT, DPT Physical Therapist - [facility]  [phone] ([place])    Arrey C 09/13/2021, 10:30 AM

## 2021-09-13 NOTE — Plan of Care (Signed)
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

## 2021-09-13 NOTE — Progress Notes (Addendum)
Subjective: 2 Days Post-Op Procedure(s) (LRB): IRRIGATION AND DEBRIDEMENT WITH POLY EXCHANGE LEFT KNEE (Left) APPLICATION OF WOUND VAC (Left) Patient reports pain as mild to moderate.   Patient is well, and has had no acute complaints or problems Denies any CP, SOB, ABD pain. We will continue therapy today.  Plan is to go Home after hospital stay.  Objective: Vital signs in last 24 hours: Temp:  [98.2 F (36.8 C)-100.4 F (38 C)] 98.2 F (36.8 C) (09/04 0332) Pulse Rate:  [98-129] 98 (09/04 0332) Resp:  [17-20] 17 (09/04 0332) BP: (123-153)/(69-87) 132/76 (09/04 0332) SpO2:  [94 %-100 %] 98 % (09/04 0332)  Intake/Output from previous day: 09/03 0701 - 09/04 0700 In: 1361.4 [P.O.:360; I.V.:201.4; IV Piggyback:800] Out: 400 [Urine:400] Intake/Output this shift: No intake/output data recorded.  Recent Labs    09/10/21 1557  HGB 13.0   Recent Labs    09/10/21 1557  WBC 10.7*  RBC 5.11  HCT 40.2  PLT 165   Recent Labs    09/10/21 1557 09/12/21 0925 09/13/21 0530  NA 133*  --   --   K 3.6  --   --   CL 101  --   --   CO2 24  --   --   BUN 21  --   --   CREATININE 1.53* 1.38* 1.48*  GLUCOSE 191*  --   --   CALCIUM 8.9  --   --    No results for input(s): "LABPT", "INR" in the last 72 hours.  EXAM General - Patient is Alert, Appropriate, and Oriented Extremity - Neurovascular intact Sensation intact distally Intact pulses distally Dorsiflexion/Plantar flexion intact Dressing - dressing C/D/I. Provena intact with out drainage Motor Function - intact, moving foot and toes well on exam. + SLR.  Ambulated 200 feet with physical therapy.  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   Weight as of this encounter: 89.8 kg. Advance diet Up with therapy VSS Pain controlled ID consult pending - Continue with IV abx. Cultures growing gram positive cocci. Sensitivities pending CM to assist with discharge to home with HHPT most likely Tuesday.  DVT Prophylaxis - Lovenox, TED hose, and SCDS Weight-Bearing as tolerated to left leg   Reche Dixon PA-C [place] 09/13/2021, 7:11 AM   Some tachycardia this am, may be pain related.

## 2021-09-13 NOTE — Plan of Care (Signed)
  Problem: Education: Goal: Knowledge of General Education information will improve Description: Including pain rating scale, medication(s)/side effects and non-pharmacologic comfort measures Outcome: Progressing   Problem: Health Behavior/Discharge Planning: Goal: Ability to manage health-related needs will improve Outcome: Progressing   Problem: Clinical Measurements: Goal: Ability to maintain clinical measurements within normal limits will improve Outcome: Progressing Goal: Will remain free from infection Outcome: Progressing Goal: Diagnostic test results will improve Outcome: Progressing Goal: Respiratory complications will improve Outcome: Progressing Goal: Cardiovascular complication will be avoided Outcome: Progressing   Problem: Activity: Goal: Risk for activity intolerance will decrease Outcome: Progressing   Problem: Nutrition: Goal: Adequate nutrition will be maintained Outcome: Progressing   Problem: Coping: Goal: Level of anxiety will decrease Outcome: Progressing   Problem: Elimination: Goal: Will not experience complications related to bowel motility Outcome: Progressing Goal: Will not experience complications related to urinary retention Outcome: Progressing   Problem: Pain Managment: Goal: General experience of comfort will improve Outcome: Progressing   Problem: Safety: Goal: Ability to remain free from injury will improve Outcome: Progressing   Problem: Skin Integrity: Goal: Risk for impaired skin integrity will decrease Outcome: Progressing   Problem: Education: Goal: Ability to describe self-care measures that may prevent or decrease complications (Diabetes Survival Skills Education) will improve Outcome: Progressing Goal: Individualized Educational Video(s) Outcome: Progressing   Problem: Coping: Goal: Ability to adjust to condition or change in health will improve Outcome: Progressing   Problem: Fluid Volume: Goal: Ability to  maintain a balanced intake and output will improve Outcome: Progressing   Problem: Health Behavior/Discharge Planning: Goal: Ability to identify and utilize available resources and services will improve Outcome: Progressing Goal: Ability to manage health-related needs will improve Outcome: Progressing   Problem: Metabolic: Goal: Ability to maintain appropriate glucose levels will improve Outcome: Progressing   Problem: Nutritional: Goal: Maintenance of adequate nutrition will improve Outcome: Progressing Goal: Progress toward achieving an optimal weight will improve Outcome: Progressing   Problem: Skin Integrity: Goal: Risk for impaired skin integrity will decrease Outcome: Progressing   Problem: Tissue Perfusion: Goal: Adequacy of tissue perfusion will improve Outcome: Progressing   Problem: Education: Goal: Knowledge of the prescribed therapeutic regimen will improve Outcome: Progressing Goal: Individualized Educational Video(s) Outcome: Progressing   Problem: Activity: Goal: Ability to avoid complications of mobility impairment will improve Outcome: Progressing Goal: Range of joint motion will improve Outcome: Progressing   Problem: Clinical Measurements: Goal: Postoperative complications will be avoided or minimized Outcome: Progressing   Problem: Pain Management: Goal: Pain level will decrease with appropriate interventions Outcome: Progressing   Problem: Skin Integrity: Goal: Will show signs of wound healing Outcome: Progressing

## 2021-09-13 NOTE — TOC Progression Note (Signed)
Transition of Care [facility]) - Progression Note    Patient Details  Name: Erik Martinez MRN: [id number] Date of Birth: 1955-11-29  Transition [facility]) CM/SW [place], RN Phone Number: 09/13/2021, 8:49 AM  Clinical Narrative:    Anticipate that the patient will need IV ABX at DC, Pam with [employer] aware, I reached out to [place] with [place] to line up [facility],  He will have a prevena for a wound vac and not need a 69M wound vac, Sensitivity pending to determine the kind of ABX TOC to follow for DC planning        Expected Discharge Plan and Services                                                 Social Determinants of Health (SDOH) Interventions    Readmission Risk Interventions     No data to display

## 2021-09-13 NOTE — Care Management Important Message (Signed)
Important Message  Patient Details  Name: Erik Martinez MRN: [id number] Date of Birth: Apr 01, 1955   Medicare Important Message Given:  N/A - LOS <3 / Initial given by admissions     Juliann Pulse A [place] Hinkson 09/13/2021, 2:37 PM

## 2021-09-14 ENCOUNTER — Encounter: Payer: Self-pay | Admitting: Orthopedic Surgery

## 2021-09-14 DIAGNOSIS — Z794 Long term (current) use of insulin: Secondary | ICD-10-CM

## 2021-09-14 DIAGNOSIS — E119 Type 2 diabetes mellitus without complications: Secondary | ICD-10-CM | POA: Diagnosis not present

## 2021-09-14 DIAGNOSIS — T8454XD Infection and inflammatory reaction due to internal left knee prosthesis, subsequent encounter: Secondary | ICD-10-CM

## 2021-09-14 LAB — GLUCOSE, CAPILLARY
Glucose-Capillary: 171 mg/dL — ABNORMAL HIGH (ref 70–99)
Glucose-Capillary: 170 mg/dL — ABNORMAL HIGH (ref 70–99)
Glucose-Capillary: 188 mg/dL — ABNORMAL HIGH (ref 70–99)
Glucose-Capillary: 182 mg/dL — ABNORMAL HIGH (ref 70–99)
Glucose-Capillary: 195 mg/dL — ABNORMAL HIGH (ref 70–99)

## 2021-09-14 MED ORDER — SODIUM CHLORIDE 0.9 % IV SOLN
12.0000 g | INTRAVENOUS | Status: DC
Start: 2021-09-14 — End: 2021-09-17
  Administered 2021-09-14 – 2021-09-16 (×3): 12 g via INTRAVENOUS
  Filled 2021-09-14 (×4): qty 48

## 2021-09-14 MED ORDER — NAFCILLIN SODIUM 2 G IJ SOLR: 2.0000 g | Freq: Once | INTRAMUSCULAR | Status: DC

## 2021-09-14 MED ORDER — SODIUM CHLORIDE 0.9 % IV SOLN
2.0000 g | Freq: Once | INTRAVENOUS | Status: AC
  Administered 2021-09-14: 2 g via INTRAVENOUS
  Filled 2021-09-14: qty 8

## 2021-09-14 NOTE — Progress Notes (Signed)
RN notified of RED MEWS at 2220 by technician. RN started protocol and paged on call for [place]. On call MD was notified of the vitals. Blood pressure (!) 142/65, pulse 94, temperature (!) 101.2 F (38.4 C), resp. rate 16, height '5\' 10"'$  (1.778 m), weight 89.8 kg, SpO2 99 %. MD stated they would start patient on HR and BP medications since patient was already on post-op ABX. Orders for medications were placed and administered by RN.

## 2021-09-14 NOTE — TOC Progression Note (Signed)
Transition of Care [facility]) - Progression Note    Patient Details  Name: Erik Martinez MRN: [id number] Date of Birth: [birth year]  Transition of Care [facility]) CM/SW [place], RN Phone Number: 09/14/2021, 2:06 PM  Clinical Narrative:     ID to see the patient, TOC to continue to follow up and assist with DC Planning       Expected Discharge Plan and Services                                                 Social Determinants of Health (SDOH) Interventions    Readmission Risk Interventions     No data to display

## 2021-09-14 NOTE — Plan of Care (Signed)
  Problem: Education: Goal: Knowledge of General Education information will improve Description: Including pain rating scale, medication(s)/side effects and non-pharmacologic comfort measures Outcome: Progressing   Problem: Health Behavior/Discharge Planning: Goal: Ability to manage health-related needs will improve Outcome: Progressing   Problem: Clinical Measurements: Goal: Ability to maintain clinical measurements within normal limits will improve Outcome: Progressing Goal: Will remain free from infection Outcome: Progressing Goal: Diagnostic test results will improve Outcome: Progressing Goal: Respiratory complications will improve Outcome: Progressing Goal: Cardiovascular complication will be avoided Outcome: Progressing   Problem: Activity: Goal: Risk for activity intolerance will decrease Outcome: Progressing   Problem: Nutrition: Goal: Adequate nutrition will be maintained Outcome: Progressing   Problem: Coping: Goal: Level of anxiety will decrease Outcome: Progressing   Problem: Elimination: Goal: Will not experience complications related to bowel motility Outcome: Progressing Goal: Will not experience complications related to urinary retention Outcome: Progressing   Problem: Pain Managment: Goal: General experience of comfort will improve Outcome: Progressing   Problem: Safety: Goal: Ability to remain free from injury will improve Outcome: Progressing   Problem: Skin Integrity: Goal: Risk for impaired skin integrity will decrease Outcome: Progressing   Problem: Education: Goal: Ability to describe self-care measures that may prevent or decrease complications (Diabetes Survival Skills Education) will improve Outcome: Progressing Goal: Individualized Educational Video(s) Outcome: Progressing   Problem: Coping: Goal: Ability to adjust to condition or change in health will improve Outcome: Progressing   Problem: Fluid Volume: Goal: Ability to  maintain a balanced intake and output will improve Outcome: Progressing   Problem: Health Behavior/Discharge Planning: Goal: Ability to identify and utilize available resources and services will improve Outcome: Progressing Goal: Ability to manage health-related needs will improve Outcome: Progressing   Problem: Metabolic: Goal: Ability to maintain appropriate glucose levels will improve Outcome: Progressing   Problem: Nutritional: Goal: Maintenance of adequate nutrition will improve Outcome: Progressing Goal: Progress toward achieving an optimal weight will improve Outcome: Progressing   Problem: Skin Integrity: Goal: Risk for impaired skin integrity will decrease Outcome: Progressing   Problem: Tissue Perfusion: Goal: Adequacy of tissue perfusion will improve Outcome: Progressing   Problem: Education: Goal: Knowledge of the prescribed therapeutic regimen will improve Outcome: Progressing Goal: Individualized Educational Video(s) Outcome: Progressing   Problem: Activity: Goal: Ability to avoid complications of mobility impairment will improve Outcome: Progressing Goal: Range of joint motion will improve Outcome: Progressing   Problem: Clinical Measurements: Goal: Postoperative complications will be avoided or minimized Outcome: Progressing   Problem: Pain Management: Goal: Pain level will decrease with appropriate interventions Outcome: Progressing   Problem: Skin Integrity: Goal: Will show signs of wound healing Outcome: Progressing

## 2021-09-14 NOTE — Consult Note (Signed)
NAME: Erik Martinez  DOB: 1955/10/31  MRN: [id number]  Date/Time: 09/14/2021 2:32 PM  REQUESTING PROVIDER: Dr.Hooten Subjective:  REASON FOR CONSULT: MSSA PJI ? Erik Martinez is a 66 y.o. 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 He had some swelling and went to see ortho on 09/08/21- asked to take NSAID and labs ordered. On 9/1 patient called ortho as he had severe drainage of yellow, purulent stuff from the surgical site. A culture was taken and he was asked to get admitted On 09/11/21 he underwent I/D of left knee with polyethylene exchange and placement of antibiotic beads Culture positive for MSSa and I am seeing the patient for the same Has been clean of any substance use in 5 years    Past Medical History:  Diagnosis Date   Anxiety    Arthritis    Avascular necrosis of bones of both hips (HCC)    Bipolar disorder (HCC)    Bladder spasms    Chronic pain of left knee    Chronic pain syndrome    a.) followed by pain clinic   Cocaine use disorder, moderate, dependence ([place]) 09/09/2015   DDD (degenerative disc disease)    Depression    Erectile dysfunction    a.) on PDE5i (sildenafil)   Frequency-urgency syndrome    GERD (gastroesophageal reflux disease)    History of hepatitis B    1983  TX'D--  NO ISSUES OR SYMPTOMS SINCE   Hypotestosteronemia    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   Nocturia    Polysubstance abuse ([place])    a.) cocaine + marijuana + BZO + opioids   PONV (postoperative nausea and vomiting)    "difficult to put me to sleep"   Spinal headache    T2DM (type 2 diabetes mellitus) ([place])    Urine incontinence      Past Surgical History:  Procedure Laterality Date   APPLICATION OF WOUND VAC Left 09/11/2021   Procedure: APPLICATION OF WOUND VAC;  Surgeon: [name], MD;  Location: [facility];  Service: Orthopedics;  Laterality: Left;  [id number]    BACK SURGERY     2 rods and 4 screws artificial disc   INTERSTIM IMPLANT PLACEMENT  2007   INTERSTIM IMPLANT REVISION N/A 08/17/2012   Procedure: REPLACMENT OF IPG PLUS REPLACE LEAD OF INTERSTIM IMPLANT ;  Surgeon: [name], MD;  Location: [place];  Service: Urology;  Laterality: N/A;   IRRIGATION AND DEBRIDEMENT KNEE Left 09/11/2021   Procedure: IRRIGATION AND DEBRIDEMENT WITH POLY EXCHANGE LEFT KNEE;  Surgeon: [name], MD;  Location: [facility];  Service: Orthopedics;  Laterality: Left;   JOINT REPLACEMENT     Lt knee and Lt hip   LOWER EXTREMITY ANGIOGRAPHY Left 11/17/2020   Procedure: LOWER EXTREMITY ANGIOGRAPHY;  Surgeon: Katha Cabal, MD;  Location: [place] [facility];  Service: Cardiovascular;  Laterality: Left;   LOWER EXTREMITY ANGIOGRAPHY Right 12/22/2020   Procedure: LOWER EXTREMITY ANGIOGRAPHY;  Surgeon: Katha Cabal, MD;  Location: [place] [facility];  Service: Cardiovascular;  Laterality: Right;   [place] SURGERY  2005  L5   LUMBAR FUSION  X2  2006  &  2007   OPEN DEBRIDEMENT LEFT TOTAL KNEE (SCAR, BONEY GRAOWTH)/ REMOVAL OLD SUTURES  05-01-2007   PARTIAL HIP ARTHROPLASTY Left 2001   x2   SHOULDER OPEN ROTATOR CUFF REPAIR Left 2000   TOTAL HIP ARTHROPLASTY Left 2002   TOTAL KNEE ARTHROPLASTY Left    PARTIAL LEFT KNEE REPLACEMENT PRIOR TO THIS   TOTAL KNEE REVISION Left 2008   TOTAL KNEE REVISION Left 07/19/2021   Procedure: TOTAL KNEE REVISION;  Surgeon: [name], MD;  Location: [facility];  Service: Orthopedics;  Laterality: Left;   TRIGGER FINGER RELEASE Bilateral    SEVERAL FINGERS   TRIGGER FINGER RELEASE Right 06/18/2014   Procedure: RELEASE TRIGGER FINGER/A-1 PULLEY;   Surgeon: [name], MD;  Location: [facility];  Service: Orthopedics;  Laterality: Right;    Social History   Socioeconomic History   Marital status: Divorced    Spouse name: Not on file   Number of children: 3   Years of education: Not on file   Highest education level: Some college, no degree  Occupational History   Not on file  Tobacco Use   Smoking status: Never   Smokeless tobacco: Never  Vaping Use   Vaping Use: Never used  Substance and Sexual Activity   Alcohol use: Not Currently    Alcohol/week: 0.0 - 2.0 standard drinks of alcohol   Drug use: Not Currently    Types: Cocaine    Comment: pt states its been years since he used cocaine 6 yrs ago   Sexual activity: Not Currently  Other Topics Concern   Not on file  Social History Narrative    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, partime   Social Determinants of Health   Financial Resource Strain: [place]  (06/08/2021)   Overall Financial Resource Strain (CARDIA)    Difficulty of Paying Living Expenses: Not very hard  Food Insecurity: No Food Insecurity (06/08/2021)   Hunger Vital Sign    Worried About Running Out of Food in the Last Year: Never true    Ran Out of Food in the Last Year: Never true  Transportation Needs: Not on file  Physical Activity: Not on file  Stress: Not on file  Social Connections: Not on file  Intimate Partner Violence: Not on file    Family History  Problem Relation Age of Onset   Diabetes Mother    Arthritis Mother    Cancer Mother        lung cancer   Hyperlipidemia Mother    Stroke Mother    Alcohol abuse Father    Cancer Father        lung   Arthritis Father    Diabetes Brother    Allergies  Allergen Reactions   Suboxone [Buprenorphine Hcl-Naloxone Hcl] Nausea And Vomiting   Tape Other (See Comments)    [place] *Paper Tape is ok    Codeine Rash, Itching and Nausea Only   Morphine And Related Itching and Rash   Other Rash    Telemetry electrodes   Silicone Rash    [place] -*Paper  Tape is ok    I? Current Facility-Administered Medications  Medication Dose Route Frequency Provider Last Rate Last Admin   0.9 %  sodium chloride infusion   Intravenous Continuous Hooten, Laurice Record, MD 10 mL/hr at 09/13/21 1511 Infusion Verify at 09/13/21 1511   acetaminophen (TYLENOL) tablet 325-650 mg  325-650 mg Oral Q6H PRN Hooten,  Laurice Record, MD   650 mg at 09/14/21 0631   alum & mag hydroxide-simeth (MAALOX/MYLANTA) 200-200-20 MG/5ML suspension 30 mL  30 mL Oral Q4H PRN Hooten, Laurice Record, MD       atenolol (TENORMIN) tablet 50 mg  50 mg Oral BID Hessie Knows, MD   50 mg at 09/14/21 1009   atorvastatin (LIPITOR) tablet 20 mg  20 mg Oral QHS Hooten, Laurice Record, MD   20 mg at 09/13/21 2157   bisacodyl (DULCOLAX) suppository 10 mg  10 mg Rectal Daily PRN Hooten, Laurice Record, MD       ceFAZolin (ANCEF) IVPB 2g/100 mL premix  2 g Intravenous Q8H Hessie Knows, MD 200 mL/hr at 09/14/21 1354 2 g at 09/14/21 1354   celecoxib (CELEBREX) capsule 200 mg  200 mg Oral BID [name], MD   200 mg at 09/14/21 1010   cloNIDine (CATAPRES) tablet 0.2 mg  0.2 mg Oral BID Hessie Knows, MD   0.2 mg at 09/14/21 1010   cyclobenzaprine (FLEXERIL) tablet 10 mg  10 mg Oral Q12H Hooten, Laurice Record, MD   10 mg at 09/14/21 1010   diphenhydrAMINE (BENADRYL) 12.5 MG/5ML elixir 12.5-25 mg  12.5-25 mg Oral Q4H PRN [name], MD   25 mg at 09/13/21 1804   enoxaparin (LOVENOX) injection 30 mg  30 mg Subcutaneous Q12H Hooten, Laurice Record, MD   30 mg at 09/14/21 1008   ferrous sulfate tablet 325 mg  325 mg Oral Q breakfast Hooten, Laurice Record, MD   325 mg at 09/14/21 1010   insulin aspart (novoLOG) injection 0-15 Units  0-15 Units Subcutaneous TID WC Hooten, Laurice Record, MD   3 Units at 09/13/21 1255   insulin aspart (novoLOG) injection 0-5 Units  0-5 Units Subcutaneous QHS [name], MD   4 Units at 09/13/21 2206   lidocaine (LIDODERM) 5 % 1 patch  1 patch Transdermal Q24H Hooten, Laurice Record, MD   1 patch at 09/14/21 0018   magnesium  hydroxide (MILK OF MAGNESIA) suspension 30 mL  30 mL Oral Daily Hooten, Laurice Record, MD   30 mL at 09/14/21 1011   menthol-cetylpyridinium (CEPACOL) lozenge 3 mg  1 lozenge Oral PRN Hooten, Laurice Record, MD       Or   phenol (CHLORASEPTIC) mouth spray 1 spray  1 spray Mouth/Throat PRN Hooten, Laurice Record, MD       metFORMIN (GLUCOPHAGE) tablet 500 mg  500 mg Oral BID WC Hooten, Laurice Record, MD   500 mg at 09/14/21 1010   multivitamin with minerals tablet 1 tablet  1 tablet Oral q AM Hooten, Laurice Record, MD   1 tablet at 09/14/21 0622   mupirocin ointment (BACTROBAN) 2 % 1 Application  1 Application Nasal BID Hooten, Laurice Record, MD   1 Application at 66/44/03 1011   ondansetron (ZOFRAN) tablet 4 mg  4 mg Oral Q6H PRN Hooten, Laurice Record, MD       Or   ondansetron (ZOFRAN) injection 4 mg  4 mg Intravenous Q6H PRN Hooten, Laurice Record, MD       oxyCODONE (Oxy IR/ROXICODONE) immediate release tablet 10 mg  10 mg Oral Q4H PRN Hooten, Laurice Record, MD   10 mg at 09/14/21 1350   oxyCODONE (Oxy IR/ROXICODONE) immediate release tablet 15 mg  15 mg Oral Q6H Hooten, Laurice Record, MD   15 mg at 09/14/21 1231   oxyCODONE (Oxy IR/ROXICODONE) immediate release tablet 5 mg  5 mg Oral Q4H  PRN Hooten, Laurice Record, MD       pantoprazole (PROTONIX) EC tablet 40 mg  40 mg Oral BID [name], MD   40 mg at 09/14/21 1010   pregabalin (LYRICA) capsule 300 mg  300 mg Oral BID [name], MD   300 mg at 09/14/21 1009   senna-docusate (Senokot-S) tablet 1 tablet  1 tablet Oral BID [name], MD   1 tablet at 09/14/21 1009   sodium phosphate (FLEET) 7-19 GM/118ML enema 1 enema  1 enema Rectal Once PRN Hooten, Laurice Record, MD       terbinafine (LAMISIL) tablet 250 mg  250 mg Oral Daily Hooten, Laurice Record, MD   250 mg at 09/14/21 1011   zolpidem (AMBIEN) tablet 10 mg  10 mg Oral QHS PRN [name], MD   10 mg at 09/14/21 0018     Abtx:  Anti-infectives (From admission, onward)    Start     Dose/Rate Route Frequency Ordered Stop   09/13/21 2200   ceFAZolin (ANCEF) IVPB 2g/100 mL premix  Status:  Discontinued        2 g 200 mL/hr over 30 Minutes Intravenous Every 8 hours 09/13/21 1040 09/13/21 1042   09/13/21 1400  ceFAZolin (ANCEF) IVPB 2g/100 mL premix        2 g 200 mL/hr over 30 Minutes Intravenous Every 8 hours 09/13/21 1042     09/13/21 1000  vancomycin (VANCOREADY) IVPB 1750 mg/350 mL  Status:  Discontinued        1,750 mg 175 mL/hr over 120 Minutes Intravenous Every 24 hours 09/12/21 1553 09/13/21 1039   09/12/21 2200  ceFAZolin (ANCEF) IVPB 2g/100 mL premix  Status:  Discontinued        2 g 200 mL/hr over 30 Minutes Intravenous Every 8 hours 09/12/21 1428 09/12/21 1555   09/12/21 1000  vancomycin (VANCOREADY) IVPB 1500 mg/300 mL  Status:  Discontinued        1,500 mg 150 mL/hr over 120 Minutes Intravenous Every 24 hours 09/11/21 1452 09/12/21 1553   09/11/21 1545  vancomycin (VANCOREADY) IVPB 750 mg/150 mL        750 mg 150 mL/hr over 60 Minutes Intravenous  Once 09/11/21 1452 09/11/21 1900   09/11/21 1330  ceFAZolin (ANCEF) IVPB 2g/100 mL premix  Status:  Discontinued        2 g 200 mL/hr over 30 Minutes Intravenous Every 6 hours 09/11/21 1317 09/12/21 1428   09/11/21 1045  vancomycin (VANCOCIN) 1,000 mg in sodium chloride 0.9 % 250 mL IVPB  Status:  Discontinued       Note to Pharmacy: OR 1   1,000 mg 250 mL/hr over 60 Minutes Intravenous  Once 09/11/21 1037 09/11/21 1039   09/11/21 1045  ceFAZolin (ANCEF) IVPB 2g/100 mL premix  Status:  Discontinued        2 g 200 mL/hr over 30 Minutes Intravenous  Once 09/11/21 1037 09/11/21 1401   09/11/21 1045  vancomycin (VANCOCIN) IVPB 1000 mg/200 mL premix        1,000 mg 200 mL/hr over 60 Minutes Intravenous  Once 09/11/21 1039 09/11/21 1109   09/11/21 1044  gentamicin (GARAMYCIN) injection  Status:  Discontinued          As needed 09/11/21 1052 09/11/21 1231   09/11/21 1043  vancomycin (VANCOCIN) powder  Status:  Discontinued          As needed 09/11/21 1043 09/11/21 1231    09/10/21 2300  terbinafine (LAMISIL) tablet 250 mg        250 mg Oral Daily 09/10/21 2237         REVIEW OF SYSTEMS:  Const: negative fever, negative chills, negative weight loss Eyes: negative diplopia or visual changes, negative eye pain ENT: negative coryza, negative sore throat Resp: negative cough, hemoptysis, dyspnea Cards: negative for chest pain, palpitations, lower extremity edema GU: negative for frequency, dysuria and hematuria GI: Negative for abdominal pain, diarrhea, bleeding, constipation Skin: negative for rash and pruritus Heme: negative for easy bruising and gum/nose bleeding MS: left knee pain and discharge Neurolo:negative for headaches, dizziness, vertigo, memory problems  Psych: negative for feelings of anxiety, depression  Endocrine: , diabetes Allergy/Immunology- as above ? Objective:  VITALS:  BP (!) 129/54 (BP Location: Left Arm)   Pulse 85   Temp 98.2 F (36.8 C)   Resp 16   Ht '5\' 10"'$  (1.778 m)   Wt 89.8 kg   SpO2 97%   BMI 28.41 kg/m   PHYSICAL EXAM:  General: Alert, cooperative, no distress, appears stated age.  Head: Normocephalic, without obvious abnormality, atraumatic. Eyes: Conjunctivae clear, anicteric sclerae. Pupils are equal ENT Nares normal. No drainage or sinus tenderness. Lips, mucosa, and tongue normal. No Thrush Neck: Supple, symmetrical, no adenopathy, thyroid: non tender no carotid bruit and no JVD. Back: No CVA tenderness. Lungs: Clear to auscultation bilaterally. No Wheezing or Rhonchi. No rales. Heart: Regular rate and rhythm, no murmur, rub or gallop. Abdomen: Soft, non-tender,not distended. Bowel sounds normal. No masses Extremities: left knee - dressing not removed Skin: No rashes or lesions. Or bruising Lymph: Cervical, supraclavicular normal. Neurologic: Grossly non-focal Pertinent Labs Lab Results CBC    Component Value Date/Time   WBC 10.7 (H) 09/10/2021 1557   RBC 5.11 09/10/2021 1557   HGB 13.0  09/10/2021 1557   HGB 13.4 05/23/2013 0329   HCT 40.2 09/10/2021 1557   HCT 38.5 (L) 05/23/2013 0329   PLT 165 09/10/2021 1557   PLT 153 05/23/2013 0329   MCV 78.7 (L) 09/10/2021 1557   MCV 86 05/23/2013 0329   MCH 25.4 (L) 09/10/2021 1557   MCHC 32.3 09/10/2021 1557   RDW 18.6 (H) 09/10/2021 1557   RDW 12.7 05/23/2013 0329   LYMPHSABS 1.5 08/01/2021 0513   LYMPHSABS 1.7 05/23/2013 0329   MONOABS 0.5 08/01/2021 0513   MONOABS 0.4 05/23/2013 0329   EOSABS 0.3 08/01/2021 0513   EOSABS 0.2 05/23/2013 0329   BASOSABS 0.0 08/01/2021 0513   BASOSABS 0.0 05/23/2013 0329       Latest Ref Rng & Units 09/13/2021    5:30 AM 09/12/2021    9:25 AM 09/10/2021    3:57 PM  CMP  Glucose 70 - 99 mg/dL   191   BUN 8 - 23 mg/dL   21   Creatinine 0.61 - 1.24 mg/dL 1.48  1.38  1.53   Sodium 135 - 145 mmol/L   133   Potassium 3.5 - 5.1 mmol/L   3.6   Chloride 98 - 111 mmol/L   101   CO2 22 - 32 mmol/L   24   Calcium 8.9 - 10.3 mg/dL   8.9       Microbiology: Recent Results (from the past 240 hour(s))  Aerobic/Anaerobic Culture w Gram Stain (surgical/deep wound)     Status: None (Preliminary result)   Collection Time: 09/10/21  2:40 PM   Specimen: KNEE; Abscess  Result Value Ref Range Status   Specimen Description  Final    KNEE Performed at [facility], [address] [place], [place] [address]    Special Requests   Final    PAIN IN LEFT KNEE AFTER KNEE REPLACEMENT Performed at [facility], [address] [place], [place] [address]    Gram Stain   Final    RARE WBC PRESENT, PREDOMINANTLY MONONUCLEAR RARE GRAM POSITIVE COCCI IN PAIRS IN TETRADS Performed at [facility], [place] [address] [place], [place] [address]    Culture   Final    FEW STAPHYLOCOCCUS AUREUS NO ANAEROBES ISOLATED; CULTURE IN PROGRESS FOR 5 DAYS    Report Status PENDING  Incomplete   Organism ID, Bacteria STAPHYLOCOCCUS AUREUS  Final      Susceptibility   Staphylococcus aureus -  MIC*    CIPROFLOXACIN <=0.5 SENSITIVE Sensitive     ERYTHROMYCIN <=0.25 SENSITIVE Sensitive     GENTAMICIN <=0.5 SENSITIVE Sensitive     OXACILLIN 0.5 SENSITIVE Sensitive     TETRACYCLINE <=1 SENSITIVE Sensitive     VANCOMYCIN <=0.5 SENSITIVE Sensitive     TRIMETH/SULFA <=10 SENSITIVE Sensitive     CLINDAMYCIN <=0.25 SENSITIVE Sensitive     RIFAMPIN <=0.5 SENSITIVE Sensitive     Inducible Clindamycin NEGATIVE Sensitive     * FEW STAPHYLOCOCCUS AUREUS  Surgical PCR screen     Status: Abnormal   Collection Time: 09/11/21  2:58 AM   Specimen: Nasal Mucosa; Nasal Swab  Result Value Ref Range Status   MRSA, PCR NEGATIVE NEGATIVE Final   Staphylococcus aureus POSITIVE (A) NEGATIVE Final    Comment: (NOTE) The Xpert SA Assay (FDA approved for NASAL specimens in patients 28 years of age and older), is one component of a comprehensive surveillance program. It is not intended to diagnose infection nor to guide or monitor treatment. Performed at [facility], [address] [place], [place] [address]   Aerobic/Anaerobic Culture w Gram Stain (surgical/deep wound)     Status: None (Preliminary result)   Collection Time: 09/11/21  9:57 AM   Specimen: PATH Other; Tissue  Result Value Ref Range Status   Specimen Description   Final    KNEE LEFT SUBCUTANEOUS TISSUE Performed at [facility], [address] [place], [place] [address]    Special Requests   Final    NONE Performed at [facility], [address] [place], [place] [address]    Gram Stain   Final    GRAM POSITIVE COCCI RARE WBC SEEN RESULT CALLED TO, READ BACK BY AND VERIFIED WITH: KATHY SOMERS AT 1050 09/11/21.PMF Performed at [facility], [address] [place], [place] [address]    Culture   Final    FEW STAPHYLOCOCCUS AUREUS NO ANAEROBES ISOLATED; CULTURE IN PROGRESS FOR 5 DAYS    Report Status PENDING  Incomplete   Organism ID, Bacteria STAPHYLOCOCCUS AUREUS  Final       Susceptibility   Staphylococcus aureus - MIC*    CIPROFLOXACIN <=0.5 SENSITIVE Sensitive     ERYTHROMYCIN <=0.25 SENSITIVE Sensitive     GENTAMICIN <=0.5 SENSITIVE Sensitive     OXACILLIN 0.5 SENSITIVE Sensitive     TETRACYCLINE <=1 SENSITIVE Sensitive     VANCOMYCIN <=0.5 SENSITIVE Sensitive     TRIMETH/SULFA <=10 SENSITIVE Sensitive     CLINDAMYCIN <=0.25 SENSITIVE Sensitive     RIFAMPIN <=0.5 SENSITIVE Sensitive     Inducible Clindamycin NEGATIVE Sensitive     * FEW STAPHYLOCOCCUS AUREUS  Aerobic/Anaerobic Culture w Gram Stain (surgical/deep  wound)     Status: None (Preliminary result)   Collection Time: 09/11/21 10:15 AM   Specimen: PATH Other; Tissue  Result Value Ref Range Status   Specimen Description   Final    KNEE Performed at [facility], [address], [place], [place] [address]    Special Requests   Final    NONE LEFT [facility] POUCH Performed at [facility], [address] [place], [place] [address]    Gram Stain   Final    GRAM POSITIVE COCCI FEW WBC SEEN RESULT CALLED TO, READ BACK BY AND VERIFIED WITH: KATHY SOMERS AT 1050 09/11/21.PMF Performed at [facility], [address] [place], [place] [address]    Culture   Final    FEW STAPHYLOCOCCUS AUREUS NO ANAEROBES ISOLATED; CULTURE IN PROGRESS FOR 5 DAYS    Report Status PENDING  Incomplete   Organism ID, Bacteria STAPHYLOCOCCUS AUREUS  Final      Susceptibility   Staphylococcus aureus - MIC*    CIPROFLOXACIN <=0.5 SENSITIVE Sensitive     ERYTHROMYCIN <=0.25 SENSITIVE Sensitive     GENTAMICIN <=0.5 SENSITIVE Sensitive     OXACILLIN 0.5 SENSITIVE Sensitive     TETRACYCLINE <=1 SENSITIVE Sensitive     VANCOMYCIN <=0.5 SENSITIVE Sensitive     TRIMETH/SULFA <=10 SENSITIVE Sensitive     CLINDAMYCIN <=0.25 SENSITIVE Sensitive     RIFAMPIN <=0.5 SENSITIVE Sensitive     Inducible Clindamycin NEGATIVE Sensitive     * FEW STAPHYLOCOCCUS AUREUS  Aerobic/Anaerobic Culture w  Gram Stain (surgical/deep wound)     Status: None (Preliminary result)   Collection Time: 09/11/21 10:30 AM   Specimen: PATH Other; Tissue  Result Value Ref Range Status   Specimen Description   Final    KNEE POSTERIOR RECESS, LEFT Performed at [facility], [place] [address], [place], [place] [address]    Special Requests   Final    NONE Performed at [facility], [place], [place] [address]    Gram Stain   Final    GRAM POSITIVE COCCI RARE WBC SEEN RESULT CALLED TO, READ BACK BY AND VERIFIED WITH: KATHY SOMERS AT 1112 09/11/21.PMF Performed at [facility], [address] [place],  [address]    Culture   Final    MODERATE STAPHYLOCOCCUS AUREUS NO ANAEROBES ISOLATED; CULTURE IN PROGRESS FOR 5 DAYS    Report Status PENDING  Incomplete   Organism ID, Bacteria STAPHYLOCOCCUS AUREUS  Final      Susceptibility   Staphylococcus aureus - MIC*    CIPROFLOXACIN <=0.5 SENSITIVE Sensitive     ERYTHROMYCIN <=0.25 SENSITIVE Sensitive     GENTAMICIN <=0.5 SENSITIVE Sensitive     OXACILLIN 0.5 SENSITIVE Sensitive     TETRACYCLINE <=1 SENSITIVE Sensitive     VANCOMYCIN <=0.5 SENSITIVE Sensitive     TRIMETH/SULFA <=10 SENSITIVE Sensitive     CLINDAMYCIN <=0.25 SENSITIVE Sensitive     RIFAMPIN <=0.5 SENSITIVE Sensitive     Inducible Clindamycin NEGATIVE Sensitive     * MODERATE STAPHYLOCOCCUS AUREUS  Aerobic/Anaerobic Culture w Gram Stain (surgical/deep wound)     Status: None (Preliminary result)   Collection Time: 09/11/21 10:33 AM   Specimen: PATH Other; Tissue  Result Value Ref Range Status   Specimen Description   Final    KNEE LEFT POSTERIOR GUTTER Performed at [facility], [address] [place],  [address]    Special Requests   Final    NONE  Performed at [facility], [address] [place], [place] [address]    Gram Stain   Final    GRAM POSITIVE COCCI RARE WBC SEEN CRITICAL RESULT CALLED TO, READ  BACK BY AND VERIFIED WITH: KATHY SOMERS AT 1112 09/11/21.PMF Performed at [facility], [address] [place], [place] [address]    Culture   Final    FEW STAPHYLOCOCCUS AUREUS NO ANAEROBES ISOLATED; CULTURE IN PROGRESS FOR 5 DAYS    Report Status PENDING  Incomplete   Organism ID, Bacteria STAPHYLOCOCCUS AUREUS  Final      Susceptibility   Staphylococcus aureus - MIC*    CIPROFLOXACIN <=0.5 SENSITIVE Sensitive     ERYTHROMYCIN <=0.25 SENSITIVE Sensitive     GENTAMICIN <=0.5 SENSITIVE Sensitive     OXACILLIN 0.5 SENSITIVE Sensitive     TETRACYCLINE <=1 SENSITIVE Sensitive     VANCOMYCIN <=0.5 SENSITIVE Sensitive     TRIMETH/SULFA <=10 SENSITIVE Sensitive     CLINDAMYCIN <=0.25 SENSITIVE Sensitive     RIFAMPIN <=0.5 SENSITIVE Sensitive     Inducible Clindamycin NEGATIVE Sensitive     * FEW STAPHYLOCOCCUS AUREUS    IMAGING RESULTS:  Left knee Xray- hardware in palce I have personally reviewed the films ? Impression/Recommendation ?Left knee PJI with MSSA Currently on cefazolin Switch to nafcillin. Once afebrile/blood culture neg will add rifampin Will need IV antibiotic for 6 weeks and then PO for a total of 6 months While on nafcillin and rifampin Discontinue terbinafine to reduce the risk of hepatotoxicity  Treated HEPC- H/o substance use - clean for 5 years Will check UTOX   DM on metformin and ozempic  HTN on atenolol and clonidine  HLD on atorvastatin  Discussed management with patient ?will discuss with Dr.Hooten ? ___________________________________________________ Discussed with patient in detail Note:  This document was prepared using Dragon voice recognition software and may include unintentional dictation errors.

## 2021-09-14 NOTE — Care Management Important Message (Signed)
Important Message  Patient Details  Name: Erik Martinez MRN: [id number] Date of Birth: 09/15/1955   Medicare Important Message Given:  N/A - LOS <3 / Initial given by admissions     [name] 09/14/2021, 10:27 AM

## 2021-09-14 NOTE — Progress Notes (Signed)
  Subjective: 3 Days Post-Op Procedure(s) (LRB): IRRIGATION AND DEBRIDEMENT WITH POLY EXCHANGE LEFT KNEE (Left) APPLICATION OF WOUND VAC (Left) Patient reports pain as moderate.   Patient did have fever and tachycardia overnight and on-call MD was paged.  Plan is to go Home after hospital stay. Negative for chest pain and shortness of breath Fever: yes Gastrointestinal: negative for nausea and vomiting.    Objective: Vital signs in last 24 hours: Temp:  [97.6 F (36.4 C)-101.8 F (38.8 C)] 98.2 F (36.8 C) (09/05 0829) Pulse Rate:  [85-131] 85 (09/05 0829) Resp:  [14-17] 16 (09/05 0629) BP: (117-175)/(54-98) 129/54 (09/05 0829) SpO2:  [93 %-100 %] 97 % (09/05 0829)  Intake/Output from previous day:  Intake/Output Summary (Last 24 hours) at 09/14/2021 1222 Last data filed at 09/14/2021 0636 Gross per 24 hour  Intake 344.78 ml  Output 600 ml  Net -255.22 ml    Intake/Output this shift: No intake/output data recorded.  Labs: No results for input(s): "HGB" in the last 72 hours. No results for input(s): "WBC", "RBC", "HCT", "PLT" in the last 72 hours. Recent Labs    09/12/21 0925 09/13/21 0530  CREATININE 1.38* 1.48*   No results for input(s): "LABPT", "INR" in the last 72 hours.   EXAM General - Patient is Alert, Appropriate, and Oriented Extremity - Neurovascular intact Dorsiflexion/Plantar flexion intact Compartment soft Dressing/Incision -Polar Care in place and working, Prevena in place approx ~20 cc noted in cannister Motor Function - intact, moving foot and toes well on exam. Able to perform SLR with assistance.   Cardiovascular- Regular rate and rhythm, no murmurs/rubs/gallops Respiratory- Lungs clear to auscultation bilaterally Gastrointestinal- soft and nontender   Assessment/Plan: 3 Days Post-Op Procedure(s) (LRB): IRRIGATION AND DEBRIDEMENT WITH POLY EXCHANGE LEFT KNEE (Left) APPLICATION OF WOUND VAC (Left) Principal Problem:   Wound  drainage  Estimated body mass index is 28.41 kg/m as calculated from the following:   Height as of this encounter: '5\' 10"'$  (1.778 m).   Weight as of this encounter: 89.8 kg. Advance diet Up with therapy  -fever -tachycardia On-call MD ordered atenolol and clonidine. Blood cultures ordered by Dr. Marry Guan.  Continue IV ancef per ID. Awaiting PICC line placement but will likely need results from blood cultures prior to placement.    DVT Prophylaxis - Lovenox, Ted hose, and SCDs Weight-Bearing as tolerated to left leg  Cassell Smiles, PA-C [facility] Orthopaedic Surgery 09/14/2021, 12:22 PM

## 2021-09-14 NOTE — Progress Notes (Addendum)
Physical Therapy Treatment Patient Details Name: Erik Martinez MRN: [id number] DOB: [birth year] Today's Date: 09/14/2021   History of Present Illness Pt is a 66 y/o M admitted on 09/10/21. Pt is s/p L knee revision 07/19/21 & 2 days prior to admission pt tripped & fell on L knee & developed severe pain & swelling followed by sudden onset of drainage. Pt underwent I&D of L knee with polyethylene exchnage & placement of antibiotic beads on 09/12/21 by Dr. Marry Guan.    PT Comments    Session held this am due to fever./HR/work up.  Cleared for therapy this pm by RN.  HR 65 at rest, 76 after completing lap on unit with RW with 3-point step to gait with RW.  Pt reports general weakness in LE's.  Overall doing well with mobility and feels comfortable with discharge home.  Stated he will have help as needed and a ramp to get in/out of home.  RN in with pain meds at end of session.   Recommendations for follow up therapy are one component of a multi-disciplinary discharge planning process, led by the attending physician.  Recommendations may be updated based on patient status, additional functional criteria and insurance authorization.  Follow Up Recommendations  Home health PT     Assistance Recommended at Discharge PRN  Patient can return home with the following A little help with walking and/or transfers;A little help with bathing/dressing/bathroom;Assistance with cooking/housework;Assist for transportation;Help with stairs or ramp for entrance   Equipment Recommendations  None recommended by PT    Recommendations for Other Services       Precautions / Restrictions Precautions Precautions: Fall Restrictions Weight Bearing Restrictions: Yes LLE Weight Bearing: Weight bearing as tolerated     Mobility  Bed Mobility               General bed mobility comments: received and left in recliner    Transfers Overall transfer level: Needs assistance Equipment used: Rolling walker (2  wheels) Transfers: Sit to/from Stand Sit to Stand: Supervision                Ambulation/Gait Ambulation/Gait assistance: Supervision Gait Distance (Feet): 240 Feet Assistive device: Rolling walker (2 wheels) Gait Pattern/deviations: Step-to pattern       General Gait Details: 3-point step-to gait, pattern dominated by abntalgia with LLE locked knee swing phase.   Stairs         General stair comments: has ramp at home   Wheelchair Mobility    Modified Rankin (Stroke Patients Only)       Balance Overall balance assessment: Needs assistance Sitting-balance support: Feet supported Sitting balance-Leahy Scale: Good     Standing balance support: During functional activity, Bilateral upper extremity supported Standing balance-Leahy Scale: Good                              Cognition Arousal/Alertness: Awake/alert Behavior During Therapy: WFL for tasks assessed/performed Overall Cognitive Status: Within Functional Limits for tasks assessed                                          Exercises      General Comments        Pertinent Vitals/Pain Pain Assessment Pain Assessment: Faces Faces Pain Scale: Hurts even more Pain Location: Left quads/ mid thigh Pain Descriptors / Indicators: Discomfort,  Grimacing Pain Intervention(s): Limited activity within patient's tolerance, Monitored during session, Repositioned, RN gave pain meds during session, Ice applied    Home Living                          Prior Function            PT Goals (current goals can now be found in the care plan section) Progress towards PT goals: Progressing toward goals    Frequency    BID      PT Plan Current plan remains appropriate    Co-evaluation              AM-PAC PT "6 Clicks" Mobility   Outcome Measure  Help needed turning from your back to your side while in a flat bed without using bedrails?: None Help needed  moving from lying on your back to sitting on the side of a flat bed without using bedrails?: A Little Help needed moving to and from a bed to a chair (including a wheelchair)?: A Little Help needed standing up from a chair using your arms (e.g., wheelchair or bedside chair)?: A Little Help needed to walk in hospital room?: None Help needed climbing 3-5 steps with a railing? : A Little 6 Click Score: 20    End of Session Equipment Utilized During Treatment: Gait belt Activity Tolerance: Patient tolerated treatment well;Patient limited by pain Patient left: in chair;with call bell/phone within reach;with nursing/sitter in room Nurse Communication: Mobility status PT Visit Diagnosis: Muscle weakness (generalized) (M62.81);Other abnormalities of gait and mobility (R26.89);Pain Pain - Right/Left: Left     Time: 1335-1350 PT Time Calculation (min) (ACUTE ONLY): 15 min  Charges:  $Gait Training: 8-22 mins                   Chesley Noon, PTA 09/14/21, 2:00 PM

## 2021-09-15 DIAGNOSIS — T8454XD Infection and inflammatory reaction due to internal left knee prosthesis, subsequent encounter: Secondary | ICD-10-CM | POA: Diagnosis not present

## 2021-09-15 DIAGNOSIS — Z794 Long term (current) use of insulin: Secondary | ICD-10-CM | POA: Diagnosis not present

## 2021-09-15 DIAGNOSIS — E119 Type 2 diabetes mellitus without complications: Secondary | ICD-10-CM | POA: Diagnosis not present

## 2021-09-15 LAB — URINE DRUG SCREEN, QUALITATIVE (ARMC ONLY)
Amphetamines, Ur Screen: NOT DETECTED
Barbiturates, Ur Screen: NOT DETECTED
Benzodiazepine, Ur Scrn: NOT DETECTED
Cannabinoid 50 Ng, Ur ~~LOC~~: NOT DETECTED
Cocaine Metabolite,Ur ~~LOC~~: NOT DETECTED
MDMA (Ecstasy)Ur Screen: NOT DETECTED
Methadone Scn, Ur: NOT DETECTED
Opiate, Ur Screen: POSITIVE — AB
Phencyclidine (PCP) Ur S: NOT DETECTED
Tricyclic, Ur Screen: POSITIVE — AB

## 2021-09-15 LAB — HEPATIC FUNCTION PANEL
ALT: 13 U/L (ref 0–44)
AST: 30 U/L (ref 15–41)
Albumin: 2.3 g/dL — ABNORMAL LOW (ref 3.5–5.0)
Alkaline Phosphatase: 109 U/L (ref 38–126)
Bilirubin, Direct: 0.5 mg/dL — ABNORMAL HIGH (ref 0.0–0.2)
Indirect Bilirubin: 1 mg/dL — ABNORMAL HIGH (ref 0.3–0.9)
Total Bilirubin: 1.5 mg/dL — ABNORMAL HIGH (ref 0.3–1.2)
Total Protein: 6.5 g/dL (ref 6.5–8.1)

## 2021-09-15 LAB — AEROBIC/ANAEROBIC CULTURE W GRAM STAIN (SURGICAL/DEEP WOUND)

## 2021-09-15 LAB — GLUCOSE, CAPILLARY
Glucose-Capillary: 247 mg/dL — ABNORMAL HIGH (ref 70–99)
Glucose-Capillary: 173 mg/dL — ABNORMAL HIGH (ref 70–99)
Glucose-Capillary: 183 mg/dL — ABNORMAL HIGH (ref 70–99)
Glucose-Capillary: 164 mg/dL — ABNORMAL HIGH (ref 70–99)

## 2021-09-15 MED ORDER — RIFAMPIN 300 MG PO CAPS
300.0000 mg | ORAL_CAPSULE | Freq: Two times a day (BID) | ORAL | Status: DC
  Administered 2021-09-16 – 2021-09-17 (×3): 300 mg via ORAL
  Filled 2021-09-15 (×3): qty 1

## 2021-09-15 NOTE — Progress Notes (Signed)
ID Pt is doing better Standing near the sink and cleaning himself Pain knee better Had fever o 100.9 this morning O/e awake and alert Patient Vitals for the past 24 hrs:  BP Temp Temp src Pulse Resp SpO2  09/15/21 0841 130/62 98.3 F (36.8 C) Oral 72 -- 96 %  09/15/21 0537 (!) 130/53 (!) 100.9 F (38.3 C) -- 88 20 90 %   Chest b/l air entry [id number]  Left knee dresisng not removed  Labs    Latest Ref Rng & Units 09/10/2021    3:57 PM 08/01/2021    5:13 AM 07/07/2021   11:03 AM  CBC  WBC 4.0 - 10.5 K/uL 10.7  6.4  5.1   Hemoglobin 13.0 - 17.0 g/dL 13.0  13.1  13.2   Hematocrit 39.0 - 52.0 % 40.2  43.4  44.4   Platelets 150 - 400 K/uL 165  242  170        Latest Ref Rng & Units 09/15/2021    6:39 AM 09/13/2021    5:30 AM 09/12/2021    9:25 AM  CMP  Creatinine 0.61 - 1.24 mg/dL  1.48  1.38   Total Protein 6.5 - 8.1 g/dL 6.5     Total Bilirubin 0.3 - 1.2 mg/dL 1.5     Alkaline Phos 38 - 126 U/L 109     AST 15 - 41 U/L 30     ALT 0 - 44 U/L 13       Impression/recommendation  Left knee PJI with MSSA Currently on nafcillin Need to follow closely for any side effects and tolerating the med If unable to tolerate nafcillin will switch back to cefazolin Once afebrile/blood culture neg will add rifampin Will need IV antibiotic for 6 weeks and then PO for a total of 6 months While on nafcillin and rifampin Discontinue terbinafine to reduce the risk of hepatotoxicity Pt is not keen on going home on IV antibiotic- told him that is the first choice- he will think about it   Treated HEPC- H/o substance use - clean for 5 years UTOX- OK     DM on metformin and ozempic   HTN on atenolol and clonidine   HLD on atorvastatin   Discussed management with patient and care team

## 2021-09-15 NOTE — Progress Notes (Signed)
  Subjective: 4 Days Post-Op Procedure(s) (LRB): IRRIGATION AND DEBRIDEMENT WITH POLY EXCHANGE LEFT KNEE (Left) APPLICATION OF WOUND VAC (Left) Patient reports pain as moderate.  Complains of difficulty performing straight leg raise and some bloating.  Patient is well, and has had no acute complaints or problems Negative for chest pain and shortness of breath Fever: no Gastrointestinal: negative for nausea and vomiting.   Patient has not had a bowel movement.  Objective: Vital signs in last 24 hours: Temp:  [98.3 F (36.8 C)-100.9 F (38.3 C)] 98.3 F (36.8 C) (09/06 0841) Pulse Rate:  [72-88] 72 (09/06 0841) Resp:  [20] 20 (09/06 0537) BP: (130-144)/(53-66) 130/62 (09/06 0841) SpO2:  [90 %-96 %] 96 % (09/06 0841)  Intake/Output from previous day:  Intake/Output Summary (Last 24 hours) at 09/15/2021 1803 Last data filed at 09/15/2021 1051 Gross per 24 hour  Intake 151.81 ml  Output 680 ml  Net -528.19 ml    Intake/Output this shift: Total I/O In: 120 [P.O.:120] Out: -   Labs: No results for input(s): "HGB" in the last 72 hours. No results for input(s): "WBC", "RBC", "HCT", "PLT" in the last 72 hours. Recent Labs    09/13/21 0530  CREATININE 1.48*   No results for input(s): "LABPT", "INR" in the last 72 hours.   EXAM General - Patient is Alert, Appropriate, and Oriented Extremity - Neurovascular intact Dorsiflexion/Plantar flexion intact Compartment soft Dressing/Incision -clean, Prevena in place and working, ~50 cc serosanguinous fluid in canister Motor Function - intact, moving foot and toes well on exam. Able to perform SLR with assistance.   Cardiovascular- Regular rate and rhythm, no murmurs/rubs/gallops Respiratory- Lungs clear to auscultation bilaterally Gastrointestinal- soft, nontender, and active bowel sounds   Assessment/Plan: 4 Days Post-Op Procedure(s) (LRB): IRRIGATION AND DEBRIDEMENT WITH POLY EXCHANGE LEFT KNEE (Left) APPLICATION OF WOUND VAC  (Left) Principal Problem:   Wound drainage  Estimated body mass index is 28.41 kg/m as calculated from the following:   Height as of this encounter: '5\' 10"'$  (1.778 m).   Weight as of this encounter: 89.8 kg. Advance diet Up with therapy  RN instructed to place TED hose on non-op leg.   Blood cultures show no growth at this time. Patient currently on nafcillin.  Appreciate continued ID input.    DVT Prophylaxis - Lovenox, TED hose  Weight-Bearing as tolerated to left leg  Cassell Smiles, PA-C [facility] Orthopaedic Surgery 09/15/2021, 6:03 PM

## 2021-09-15 NOTE — Plan of Care (Signed)
  Problem: Education: Goal: Knowledge of General Education information will improve Description: Including pain rating scale, medication(s)/side effects and non-pharmacologic comfort measures Outcome: Progressing   Problem: Health Behavior/Discharge Planning: Goal: Ability to manage health-related needs will improve Outcome: Progressing   Problem: Clinical Measurements: Goal: Ability to maintain clinical measurements within normal limits will improve Outcome: Progressing Goal: Will remain free from infection Outcome: Progressing Goal: Diagnostic test results will improve Outcome: Progressing Goal: Respiratory complications will improve Outcome: Progressing Goal: Cardiovascular complication will be avoided Outcome: Progressing   Problem: Activity: Goal: Risk for activity intolerance will decrease Outcome: Progressing   Problem: Nutrition: Goal: Adequate nutrition will be maintained Outcome: Progressing   Problem: Coping: Goal: Level of anxiety will decrease Outcome: Progressing   Problem: Elimination: Goal: Will not experience complications related to bowel motility Outcome: Progressing Goal: Will not experience complications related to urinary retention Outcome: Progressing   Problem: Pain Managment: Goal: General experience of comfort will improve Outcome: Progressing   Problem: Safety: Goal: Ability to remain free from injury will improve Outcome: Progressing   Problem: Skin Integrity: Goal: Risk for impaired skin integrity will decrease Outcome: Progressing   Problem: Education: Goal: Ability to describe self-care measures that may prevent or decrease complications (Diabetes Survival Skills Education) will improve Outcome: Progressing Goal: Individualized Educational Video(s) Outcome: Progressing   Problem: Coping: Goal: Ability to adjust to condition or change in health will improve Outcome: Progressing   Problem: Fluid Volume: Goal: Ability to  maintain a balanced intake and output will improve Outcome: Progressing   Problem: Health Behavior/Discharge Planning: Goal: Ability to identify and utilize available resources and services will improve Outcome: Progressing Goal: Ability to manage health-related needs will improve Outcome: Progressing   Problem: Metabolic: Goal: Ability to maintain appropriate glucose levels will improve Outcome: Progressing   Problem: Nutritional: Goal: Maintenance of adequate nutrition will improve Outcome: Progressing Goal: Progress toward achieving an optimal weight will improve Outcome: Progressing   Problem: Skin Integrity: Goal: Risk for impaired skin integrity will decrease Outcome: Progressing   Problem: Tissue Perfusion: Goal: Adequacy of tissue perfusion will improve Outcome: Progressing   Problem: Education: Goal: Knowledge of the prescribed therapeutic regimen will improve Outcome: Progressing Goal: Individualized Educational Video(s) Outcome: Progressing   Problem: Activity: Goal: Ability to avoid complications of mobility impairment will improve Outcome: Progressing Goal: Range of joint motion will improve Outcome: Progressing   Problem: Clinical Measurements: Goal: Postoperative complications will be avoided or minimized Outcome: Progressing   Problem: Pain Management: Goal: Pain level will decrease with appropriate interventions Outcome: Progressing   Problem: Skin Integrity: Goal: Will show signs of wound healing Outcome: Progressing

## 2021-09-15 NOTE — Progress Notes (Signed)
Physical Therapy Treatment Patient Details Name: Erik Martinez MRN: [id number] DOB: 1955-03-18 Today's Date: 09/15/2021   History of Present Illness Pt is a 66 y/o M admitted on 09/10/21. Pt is s/p L knee revision 07/19/21 & 2 days prior to admission pt tripped & fell on L knee & developed severe pain & swelling followed by sudden onset of drainage. Pt underwent I&D of L knee with polyethylene exchnage & placement of antibiotic beads on 09/12/21 by Dr. Marry Guan.    PT Comments    Pt was long sitting in bed upon arrival. He agrees to session and remained cooperative throughout. A and O x 4. Endorses 7/10 pain however able to complete all desired task. Pt was able to exit R side of bed, stand to RW, and tolerate ambulation ~ 200 ft. No LOB or safety concerns. Will return this afternoon to focus on improving ROM and strength of LLE. PT recommends DC home with HHPT once deemed medically stable.    Recommendations for follow up therapy are one component of a multi-disciplinary discharge planning process, led by the attending physician.  Recommendations may be updated based on patient status, additional functional criteria and insurance authorization.  Follow Up Recommendations  Home health PT     Assistance Recommended at Discharge PRN  Patient can return home with the following A little help with walking and/or transfers;A little help with bathing/dressing/bathroom;Assistance with cooking/housework;Assist for transportation;Help with stairs or ramp for entrance   Equipment Recommendations  None recommended by PT       Precautions / Restrictions Precautions Precautions: Fall Restrictions Weight Bearing Restrictions: Yes LLE Weight Bearing: Weight bearing as tolerated     Mobility  Bed Mobility Overal bed mobility: Needs Assistance Bed Mobility: Supine to Sit  Supine to sit: Supervision  General bed mobility comments: Increased time to exit bed due to pain, however able to achieve EOB short  sit without physical assistance    Transfers Overall transfer level: Needs assistance Equipment used: Rolling walker (2 wheels) Transfers: Sit to/from Stand Sit to Stand: Min guard  General transfer comment: CGA for safety however able to stand without lifting assistance.    Ambulation/Gait Ambulation/Gait assistance: Supervision Gait Distance (Feet): 200 Feet Assistive device: Rolling walker (2 wheels) Gait Pattern/deviations: Step-through pattern, Antalgic, Trunk flexed Gait velocity: decrease     General Gait Details: Pt was able to ambulate 200 ft with RW without LOB or safety concern. antalgic gait but overall tolerated well.   Stairs  General stair comments: pt declined stair training. does have ramp entry per pt   Balance Overall balance assessment: Needs assistance Sitting-balance support: Feet supported Sitting balance-Leahy Scale: Good     Standing balance support: During functional activity, Bilateral upper extremity supported Standing balance-Leahy Scale: Good       Cognition Arousal/Alertness: Awake/alert Behavior During Therapy: WFL for tasks assessed/performed Overall Cognitive Status: Within Functional Limits for tasks assessed      General Comments: pt is A and O x 4           General Comments General comments (skin integrity, edema, etc.): will address ROM and strengthening exercises in PM session      Pertinent Vitals/Pain Pain Assessment Pain Assessment: 0-10 Pain Score: 7  Pain Location: Knee Pain Descriptors / Indicators: Discomfort, Grimacing Pain Intervention(s): Limited activity within patient's tolerance, Monitored during session, Premedicated before session, Repositioned, Ice applied     PT Goals (current goals can now be found in the care plan section) Acute Rehab  PT Goals Patient Stated Goal: home at DC Progress towards PT goals: Progressing toward goals    Frequency    BID      PT Plan Current plan remains  appropriate       AM-PAC PT "6 Clicks" Mobility   Outcome Measure  Help needed turning from your back to your side while in a flat bed without using bedrails?: None Help needed moving from lying on your back to sitting on the side of a flat bed without using bedrails?: A Little Help needed moving to and from a bed to a chair (including a wheelchair)?: A Little Help needed standing up from a chair using your arms (e.g., wheelchair or bedside chair)?: A Little Help needed to walk in hospital room?: A Little Help needed climbing 3-5 steps with a railing? : A Little 6 Click Score: 19    End of Session   Activity Tolerance: Patient tolerated treatment well Patient left: in chair;with call bell/phone within reach;with nursing/sitter in room Nurse Communication: Mobility status PT Visit Diagnosis: Muscle weakness (generalized) (M62.81);Other abnormalities of gait and mobility (R26.89);Pain Pain - Right/Left: Left Pain - part of body: Knee     Time: 0733-0803 PT Time Calculation (min) (ACUTE ONLY): 30 min  Charges:  $Gait Training: 8-22 mins $Therapeutic Activity: 8-22 mins                     Julaine Fusi PTA 09/15/21, 8:59 AM

## 2021-09-15 NOTE — Progress Notes (Signed)
Physical Therapy Treatment Patient Details Name: Erik Martinez MRN: [id number] DOB: 1955-04-11 Today's Date: 09/15/2021   History of Present Illness Pt is a 66 y/o M admitted on 09/10/21. Pt is s/p L knee revision 07/19/21 & 2 days prior to admission pt tripped & fell on L knee & developed severe pain & swelling followed by sudden onset of drainage. Pt underwent I&D of L knee with polyethylene exchnage & placement of antibiotic beads on 09/12/21 by Dr. Marry Guan.    PT Comments    Pt was asleep in supine upon arriving. He easily awakes and is cooperative throughout. Does continue to endorse 7/10 pain. This session focused on improving ROM and strength however pt did ambulate out to RN station and return prior to doing exercises (~ 172f with RW). Pt has AAROM L knee 5-82 after performing several exercises. He required slightly more assistance to exit bed than earlier in the day. Reports he will have available assistance at DC from friends." [place] Author encouraged pt to perform there ex and stretching more often to promote return in function. He states understanding and demonstrated exercises without assistance. Acute PT will continue to follow and progress as able per current POC. Recommend home with HHPT + assistance as needed.    Recommendations for follow up therapy are one component of a multi-disciplinary discharge planning process, led by the attending physician.  Recommendations may be updated based on patient status, additional functional criteria and insurance authorization.  Follow Up Recommendations  Home health PT     Assistance Recommended at Discharge PRN  Patient can return home with the following A little help with walking and/or transfers;A little help with bathing/dressing/bathroom;Assistance with cooking/housework;Assist for transportation;Help with stairs or ramp for entrance   Equipment Recommendations  None recommended by PT        Precautions / Restrictions Precautions Precautions: Fall Restrictions Weight Bearing Restrictions: Yes LLE Weight Bearing: Weight bearing as tolerated     Mobility  Bed Mobility Overal bed mobility: Needs Assistance Bed Mobility: Supine to Sit  Supine to sit: Min assist  General bed mobility comments: pt required more asistance this afternoon to exit bed. due to increased pain? Pt required min assist to progress LLE to EOB.    Transfers Overall transfer level: Needs assistance Equipment used: Rolling walker (2 wheels) Transfers: Sit to/from Stand Sit to Stand: Min guard    General transfer comment: CGA for safety form lowest bed height. Vcs for increased fwd wt shift and to have BLE(knees) flexed prior to standing.    Ambulation/Gait Ambulation/Gait assistance: Supervision Gait Distance (Feet): 100 Feet Assistive device: Rolling walker (2 wheels) Gait Pattern/deviations: Step-through pattern, Antalgic, Trunk flexed Gait velocity: decrease     General Gait Details: Auithor had pt ambulate ~ 100 ft in hopes to get pt less"stiff" prior to performing ROM/ther ex.   Stairs  General stair comments: pt declined stair training. does have ramp entry per pt      Balance Overall balance assessment: Needs assistance Sitting-balance support: Feet supported Sitting balance-Leahy Scale: Good     Standing balance support: During functional activity, Bilateral upper extremity supported Standing balance-Leahy Scale: Good Standing balance comment: reliant on RW during dynamic activity         Cognition Arousal/Alertness: Awake/alert Behavior During Therapy: WFL for tasks assessed/performed Overall Cognitive Status: Within Functional Limits for tasks assessed          General Comments: pt is A  and O x 4        Exercises Total Joint Exercises Ankle Circles/Pumps: AROM, 20 reps Knee Flexion: AROM, AAROM, 5 reps, Seated Goniometric ROM: 5-82 degrees AAROM after  stretching    General Comments General comments (skin integrity, edema, etc.): Chief Strategy Officer issued HEP handout and had pt -perform ROM exercises until OT session began.      Pertinent Vitals/Pain Pain Assessment Pain Assessment: 0-10 Pain Score: 7  Pain Location: Knee Pain Descriptors / Indicators: Discomfort, Grimacing Pain Intervention(s): Limited activity within patient's tolerance, Monitored during session, Premedicated before session, Repositioned (OT in room aat conclusion of PT session)     PT Goals (current goals can now be found in the care plan section) Acute Rehab PT Goals Patient Stated Goal: home at DC Progress towards PT goals: Progressing toward goals    Frequency    BID      PT Plan Current plan remains appropriate       AM-PAC PT "6 Clicks" Mobility   Outcome Measure  Help needed turning from your back to your side while in a flat bed without using bedrails?: None Help needed moving from lying on your back to sitting on the side of a flat bed without using bedrails?: A Little Help needed moving to and from a bed to a chair (including a wheelchair)?: A Little Help needed standing up from a chair using your arms (e.g., wheelchair or bedside chair)?: A Little Help needed to walk in hospital room?: A Little Help needed climbing 3-5 steps with a railing? : A Little 6 Click Score: 19    End of Session   Activity Tolerance: Patient tolerated treatment well;Patient limited by pain Patient left: in chair;with call bell/phone within reach;with chair alarm set (OT in room at conclusion of PT session) Nurse Communication: Mobility status PT Visit Diagnosis: Muscle weakness (generalized) (M62.81);Other abnormalities of gait and mobility (R26.89);Pain Pain - Right/Left: Left Pain - part of body: Knee     Time: 3832-9191 PT Time Calculation (min) (ACUTE ONLY): 26 min  Charges:  $Gait Training: 8-22 mins $Therapeutic Exercise: 8-22 mins                      Julaine Fusi PTA 09/15/21, 3:28 PM

## 2021-09-15 NOTE — Progress Notes (Signed)
Occupational Therapy Treatment Patient Details Name: Erik Martinez MRN: [id number] DOB: [birth year] Today's Date: 09/15/2021   History of present illness Pt is a 66 y/o M admitted on 09/10/21. Pt is s/p L knee revision 07/19/21 & 2 days prior to admission pt tripped & fell on L knee & developed severe pain & swelling followed by sudden onset of drainage. Pt underwent I&D of L knee with polyethylene exchnage & placement of antibiotic beads on 09/12/21 by Dr. Marry Guan.   OT comments  Erik Martinez was seen for OT treatment on this date. Upon arrival to room pt reclined in chair, finishing PT session. Pt agreeable to ADL session, defers further mobility. Educated on compression sock mgmt. MOD I don/doff R sock, MAX A don/doff L sock reclined in chair. Reviewed polar care and adapted dressing strategies. Pt making good progress toward goals, will continue to follow POC. Discharge recommendation remains appropriate.     Recommendations for follow up therapy are one component of a multi-disciplinary discharge planning process, led by the attending physician.  Recommendations may be updated based on patient status, additional functional criteria and insurance authorization.    Follow Up Recommendations  Home health OT    Assistance Recommended at Discharge PRN  Patient can return home with the following  A little help with walking and/or transfers;A little help with bathing/dressing/bathroom;Assistance with cooking/housework;Assist for transportation;Help with stairs or ramp for entrance   Equipment Recommendations  None recommended by OT    Recommendations for Other Services      Precautions / Restrictions Precautions Precautions: Fall Restrictions Weight Bearing Restrictions: Yes LLE Weight Bearing: Weight bearing as tolerated       Mobility Bed Mobility               General bed mobility comments: received and left sitting    Transfers                   General transfer  comment: defered, pt just completed PT session         ADL either performed or assessed with clinical judgement   ADL Overall ADL's : Needs assistance/impaired                                       General ADL Comments: MOD I don/doff R sock, MAX A don/doff L sock reclined in chair.      Cognition Arousal/Alertness: Awake/alert Behavior During Therapy: WFL for tasks assessed/performed Overall Cognitive Status: Within Functional Limits for tasks assessed                                                     Pertinent Vitals/ Pain       Pain Assessment Pain Assessment: 0-10 Pain Score: 7  Pain Location: Knee Pain Descriptors / Indicators: Discomfort, Grimacing Pain Intervention(s): Limited activity within patient's tolerance, Repositioned, Ice applied   Frequency  Min 2X/week        Progress Toward Goals  OT Goals(current goals can now be found in the care plan section)  Progress towards OT goals: Progressing toward goals  Acute Rehab OT Goals Patient Stated Goal: to go home OT Goal Formulation: With patient Time For Goal Achievement: 09/26/21 Potential to Achieve Goals: Good ADL Goals  Pt Will Perform Lower Body Dressing: Independently;with adaptive equipment;sit to/from stand Pt Will Transfer to Toilet: Independently;ambulating;bedside commode Pt Will Perform Toileting - Clothing Manipulation and hygiene: Independently;sit to/from stand  Plan Discharge plan remains appropriate;Frequency remains appropriate    Co-evaluation                 AM-PAC OT "6 Clicks" Daily Activity     Outcome Measure   Help from another person eating meals?: None Help from another person taking care of personal grooming?: None Help from another person toileting, which includes using toliet, bedpan, or urinal?: A Little Help from another person bathing (including washing, rinsing, drying)?: A Lot Help from another person to put on and  taking off regular upper body clothing?: None Help from another person to put on and taking off regular lower body clothing?: A Lot 6 Click Score: 19    End of Session    OT Visit Diagnosis: Other abnormalities of gait and mobility (R26.89);Muscle weakness (generalized) (M62.81);Pain Pain - Right/Left: Left Pain - part of body: Knee   Activity Tolerance Patient tolerated treatment well   Patient Left in chair;with call bell/phone within reach   Nurse Communication Mobility status        Time: 2449-7530 OT Time Calculation (min): 16 min  Charges: OT General Charges $OT Visit: 1 Visit OT Treatments $Self Care/Home Management : 8-22 mins  Dessie Coma, M.S. OTR/L  09/15/21, 4:02 PM  ascom [phone]

## 2021-09-16 DIAGNOSIS — T8450XA Infection and inflammatory reaction due to unspecified internal joint prosthesis, initial encounter: Secondary | ICD-10-CM | POA: Diagnosis not present

## 2021-09-16 LAB — GLUCOSE, CAPILLARY
Glucose-Capillary: 158 mg/dL — ABNORMAL HIGH (ref 70–99)
Glucose-Capillary: 180 mg/dL — ABNORMAL HIGH (ref 70–99)
Glucose-Capillary: 148 mg/dL — ABNORMAL HIGH (ref 70–99)
Glucose-Capillary: 173 mg/dL — ABNORMAL HIGH (ref 70–99)

## 2021-09-16 LAB — COMPREHENSIVE METABOLIC PANEL
ALT: 13 U/L (ref 0–44)
AST: 23 U/L (ref 15–41)
Albumin: 2.3 g/dL — ABNORMAL LOW (ref 3.5–5.0)
Alkaline Phosphatase: 97 U/L (ref 38–126)
Anion gap: 14 (ref 5–15)
BUN: 33 mg/dL — ABNORMAL HIGH (ref 8–23)
CO2: 20 mmol/L — ABNORMAL LOW (ref 22–32)
Calcium: 9.3 mg/dL (ref 8.9–10.3)
Chloride: 102 mmol/L (ref 98–111)
Creatinine, Ser: 1.35 mg/dL — ABNORMAL HIGH (ref 0.61–1.24)
GFR, Estimated: 59 mL/min — ABNORMAL LOW (ref >60)
Glucose, Bld: 176 mg/dL — ABNORMAL HIGH (ref 70–99)
Potassium: 2.9 mmol/L — ABNORMAL LOW (ref 3.5–5.1)
Sodium: 136 mmol/L (ref 135–145)
Total Bilirubin: 1.5 mg/dL — ABNORMAL HIGH (ref 0.3–1.2)
Total Protein: 6.8 g/dL (ref 6.5–8.1)

## 2021-09-16 LAB — AEROBIC/ANAEROBIC CULTURE W GRAM STAIN (SURGICAL/DEEP WOUND)

## 2021-09-16 LAB — CBC WITH DIFFERENTIAL/PLATELET
Abs Immature Granulocytes: 0.36 10*3/uL — ABNORMAL HIGH (ref 0.00–0.07)
Basophils Absolute: 0.1 10*3/uL (ref 0.0–0.1)
Basophils Relative: 1 %
Eosinophils Absolute: 0.1 10*3/uL (ref 0.0–0.5)
Eosinophils Relative: 1 %
HCT: 32.3 % — ABNORMAL LOW (ref 39.0–52.0)
Hemoglobin: 10.5 g/dL — ABNORMAL LOW (ref 13.0–17.0)
Immature Granulocytes: 3 %
Lymphocytes Relative: 8 %
Lymphs Abs: 1 10*3/uL (ref 0.7–4.0)
MCH: 25.1 pg — ABNORMAL LOW (ref 26.0–34.0)
MCHC: 32.5 g/dL (ref 30.0–36.0)
MCV: 77.3 fL — ABNORMAL LOW (ref 80.0–100.0)
Monocytes Absolute: 0.7 10*3/uL (ref 0.1–1.0)
Monocytes Relative: 5 %
Neutro Abs: 10.5 10*3/uL — ABNORMAL HIGH (ref 1.7–7.7)
Neutrophils Relative %: 82 %
Platelets: 285 10*3/uL (ref 150–400)
RBC: 4.18 MIL/uL — ABNORMAL LOW (ref 4.22–5.81)
RDW: 18.7 % — ABNORMAL HIGH (ref 11.5–15.5)
WBC: 12.8 10*3/uL — ABNORMAL HIGH (ref 4.0–10.5)
nRBC: 0 % (ref 0.0–0.2)

## 2021-09-16 LAB — SEDIMENTATION RATE: Sed Rate: >140 mm/hr — ABNORMAL HIGH (ref 0–20)

## 2021-09-16 LAB — POTASSIUM: Potassium: 3.7 mmol/L (ref 3.5–5.1)

## 2021-09-16 LAB — C-REACTIVE PROTEIN: CRP: 23.4 mg/dL — ABNORMAL HIGH (ref <1.0)

## 2021-09-16 MED ORDER — POTASSIUM CHLORIDE CRYS ER 10 MEQ PO TBCR
30.0000 meq | EXTENDED_RELEASE_TABLET | Freq: Three times a day (TID) | ORAL | Status: AC
  Administered 2021-09-16 (×3): 30 meq via ORAL
  Filled 2021-09-16 (×3): qty 1

## 2021-09-16 NOTE — Plan of Care (Signed)
  Problem: Nutrition: Goal: Adequate nutrition will be maintained Outcome: Progressing   Problem: Elimination: Goal: Will not experience complications related to urinary retention Outcome: Progressing   Problem: Safety: Goal: Ability to remain free from injury will improve Outcome: Progressing   

## 2021-09-16 NOTE — Progress Notes (Signed)
PT Cancellation Note  Patient Details Name: Erik Martinez MRN: [id number] DOB: [birth year]   Cancelled Treatment:     PT attempt. Dr Delaine Lame in with pt discussing PICC/antibiotics/DC plan. Will return in the morning and continue to follow per current POC. Pt is cleared from an acute PT standpoint for safe DC home once cleared medically.    Willette Pa 09/16/2021, 4:07 PM

## 2021-09-16 NOTE — Care Management Important Message (Signed)
Important Message  Patient Details  Name: Erik Martinez MRN: [id number] Date of Birth: [birth year]   Medicare Important Message Given:  Yes     Juliann Pulse A Keiandre Cygan 09/16/2021, 2:20 PM

## 2021-09-16 NOTE — Progress Notes (Signed)
  Subjective: 5 Days Post-Op Procedure(s) (LRB): IRRIGATION AND DEBRIDEMENT WITH POLY EXCHANGE LEFT KNEE (Left) APPLICATION OF WOUND VAC (Left) Neighbors at bedside.  Patient reports pain as moderate.  States he has requested pain medicine but has not received any yet. Reports some soreness in the heels yesterday and nursing was made aware. Plan is to go Home after hospital stay. Negative for chest pain and shortness of breath Fever: no Gastrointestinal: negative for nausea and vomiting.   Patient has not had a bowel movement.  Objective: Vital signs in last 24 hours: Temp:  [98.2 F (36.8 C)-98.7 F (37.1 C)] 98.7 F (37.1 C) (09/07 0852) Pulse Rate:  [74-81] 74 (09/07 0852) Resp:  [16-18] 17 (09/07 0852) BP: (137-143)/(61-68) 143/61 (09/07 0852) SpO2:  [93 %-98 %] 97 % (09/07 0852)  Intake/Output from previous day:  Intake/Output Summary (Last 24 hours) at 09/16/2021 0909 Last data filed at 09/16/2021 0851 Gross per 24 hour  Intake 432.35 ml  Output 700 ml  Net -267.65 ml    Intake/Output this shift: Total I/O In: 192.4 [IV Piggyback:192.4] Out: -   Labs: Recent Labs    09/16/21 0627  HGB 10.5*   Recent Labs    09/16/21 0627  WBC 12.8*  RBC 4.18*  HCT 32.3*  PLT 285   Recent Labs    09/16/21 0627  NA 136  K 2.9*  CL 102  CO2 20*  BUN 33*  CREATININE 1.35*  GLUCOSE 176*  CALCIUM 9.3   No results for input(s): "LABPT", "INR" in the last 72 hours.   EXAM General - Patient is Alert, Appropriate, and Oriented Extremity - Neurovascular intact Dorsiflexion/Plantar flexion intact Compartment soft; heel pads in paklce over bilateral heels Dressing/Incision -Prevena in place and working, ~50 cc in canister Motor Function - intact, moving foot and toes well on exam.  Cardiovascular- Regular rate and rhythm, no murmurs/rubs/gallops Respiratory- Lungs clear to auscultation bilaterally Gastrointestinal- soft, nontender, and active bowel  sounds   Assessment/Plan: 5 Days Post-Op Procedure(s) (LRB): IRRIGATION AND DEBRIDEMENT WITH POLY EXCHANGE LEFT KNEE (Left) APPLICATION OF WOUND VAC (Left) Principal Problem:   Wound drainage  Estimated body mass index is 28.41 kg/m as calculated from the following:   Height as of this encounter: '5\' 10"'$  (1.778 m).   Weight as of this encounter: 89.8 kg. Advance diet Up with therapy  Left heel examined no sign of skin breakdown.   Labs reviewed, K 2.9. Patient asymptomatic. Order placed for KlorCon, will recheck K this evening.  Spoke with RN about pain medicine request.  Continue to appreciate ID input. Did stress to patient that IV abx were more potent and provided the best chance for eradication of the total knee infection.  DVT Prophylaxis - Lovenox, Ted hose, and SCDs Weight-Bearing as tolerated to right leg  Cassell Smiles, PA-C [facility] 09/16/2021, 9:09 AM

## 2021-09-16 NOTE — TOC Progression Note (Signed)
Transition of Care [facility]) - Progression Note    Patient Details  Name: Erik Martinez MRN: [id number] Date of Birth: [birth year]  [facility] CM/SW [place], RN Phone Number: 09/16/2021, 2:01 PM  Clinical Narrative:     The patient lives alone in a camper and is able to use the shower in a barn nearby He has walked 200 feet with PT He is "thinking about " going home with IV ABX even tho he does not want to [place] agree to accept the patient for [facility] services TPC to continue to monitor for needs and assist  Expected Discharge Plan: [place] Barriers to Discharge: Continued Medical Work up  Expected Discharge Plan and Services Expected Discharge Plan: [place]                                               Social Determinants of Health (SDOH) Interventions    Readmission Risk Interventions     No data to display

## 2021-09-16 NOTE — Progress Notes (Addendum)
ID  No fever C/o pain left knee says it is not well controlled He goes to pain management clinic as OP  O/e awake and alert Patient Vitals for the past 24 hrs:  BP Temp Pulse Resp SpO2  09/16/21 0852 (!) 143/61 98.7 F (37.1 C) 74 17 97 %  09/16/21 0558 137/68 98.5 F (36.9 C) 77 16 93 %  09/15/21 2214 (!) 141/67 98.2 F (36.8 C) 81 18 98 %   Chest b/l air entry [id number] Left knee ice pack  Labs    Latest Ref Rng & Units 09/16/2021    6:27 AM 09/10/2021    3:57 PM 08/01/2021    5:13 AM  CBC  WBC 4.0 - 10.5 K/uL 12.8  10.7  6.4   Hemoglobin 13.0 - 17.0 g/dL 10.5  13.0  13.1   Hematocrit 39.0 - 52.0 % 32.3  40.2  43.4   Platelets 150 - 400 K/uL 285  165  242        Latest Ref Rng & Units 09/16/2021    6:27 AM 09/15/2021    6:39 AM 09/13/2021    5:30 AM  CMP  Glucose 70 - 99 mg/dL 176     BUN 8 - 23 mg/dL 33     Creatinine 0.61 - 1.24 mg/dL 1.35   1.48   Sodium 135 - 145 mmol/L 136     Potassium 3.5 - 5.1 mmol/L 2.9     Chloride 98 - 111 mmol/L 102     CO2 22 - 32 mmol/L 20     Calcium 8.9 - 10.3 mg/dL 9.3     Total Protein 6.5 - 8.1 g/dL 6.8  6.5    Total Bilirubin 0.3 - 1.2 mg/dL 1.5  1.5    Alkaline Phos 38 - 126 U/L 97  109    AST 15 - 41 U/L 23  30    ALT 0 - 44 U/L 13  13      Impression/recommendation  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

## 2021-09-16 NOTE — Progress Notes (Signed)
Physical Therapy Treatment Patient Details Name: Erik Martinez MRN: [id number] DOB: 16-Jun-1955 Today's Date: 09/16/2021   History of Present Illness Pt is a 66 y/o M admitted on 09/10/21. Pt is s/p L knee revision 07/19/21 & 2 days prior to admission pt tripped & fell on L knee & developed severe pain & swelling followed by sudden onset of drainage. Pt underwent I&D of L knee with polyethylene exchnage & placement of antibiotic beads on 09/12/21 by Dr. Marry Guan.    PT Comments    Pt was A and O x 4 sitting in recliner upon arriving. He was pre-medicated prior to session but continues to endorse pain. 6/10. Pain did not limit pt's abilities to fully participate in session. He demonstrates improved abilities to stand from recliner to RW. Tolerated ambulation ~ 200 ft without LOB or safety concern. Due to severity of pain, will address ROM and strength deficits in 2nd session this afternoon. He will greatly benefit from continued skilled PT (HHPT) at DC  to progress strength, ROM, and overall safety/Independence with ADLs.    Recommendations for follow up therapy are one component of a multi-disciplinary discharge planning process, led by the attending physician.  Recommendations may be updated based on patient status, additional functional criteria and insurance authorization.  Follow Up Recommendations  Home health PT     Assistance Recommended at Discharge PRN  Patient can return home with the following A little help with walking and/or transfers;A little help with bathing/dressing/bathroom;Assistance with cooking/housework;Assist for transportation;Help with stairs or ramp for entrance   Equipment Recommendations  None recommended by PT       Precautions / Restrictions Precautions Precautions: Fall Restrictions Weight Bearing Restrictions: Yes LLE Weight Bearing: Weight bearing as tolerated     Mobility  Bed Mobility    General bed mobility comments: in recliner pre/post session     Transfers Overall transfer level: Needs assistance Equipment used: Rolling walker (2 wheels) Transfers: Sit to/from Stand Sit to Stand: Supervision    General transfer comment: pt was able to stand from standard height chair with armrest to RW without physical assistance. INcreased time to perform due to pain but overall demonstrated safe abilities    Ambulation/Gait Ambulation/Gait assistance: Supervision Gait Distance (Feet): 200 Feet Assistive device: Rolling walker (2 wheels) Gait Pattern/deviations: Step-through pattern, Antalgic Gait velocity: decrease     General Gait Details: pt continues to present with safe, steady gait kinematics. no LOB or safety concerns during gait with use of RW   Stairs  General stair comments: pt declined stair training. Does have ramp entry to his home    Balance Overall balance assessment: Needs assistance Sitting-balance support: Feet supported Sitting balance-Leahy Scale: Good     Standing balance support: During functional activity, Bilateral upper extremity supported Standing balance-Leahy Scale: Good Standing balance comment: reliant on RW during dynamic activity      Cognition Arousal/Alertness: Awake/alert Behavior During Therapy: WFL for tasks assessed/performed Overall Cognitive Status: Within Functional Limits for tasks assessed      General Comments: pt is A and O x 4. Cooperative           General Comments General comments (skin integrity, edema, etc.): Pt continues to endorse severe pain however able to perform all desired task requested of him. Recommending HHPT to continue to progress ROM, strength, and safety with ADLs. 2nd/PM session to focus on ROM and strengthening      Pertinent Vitals/Pain Pain Assessment Pain Assessment: 0-10 Pain Score: 6  Faces  Pain Scale: Hurts a little bit Pain Location: Knee Pain Descriptors / Indicators: Discomfort, Grimacing Pain Intervention(s): Limited activity within  patient's tolerance, Monitored during session, Premedicated before session, Repositioned, Ice applied     PT Goals (current goals can now be found in the care plan section) Acute Rehab PT Goals Patient Stated Goal: home at DC Progress towards PT goals: Progressing toward goals    Frequency    BID      PT Plan Current plan remains appropriate       AM-PAC PT "6 Clicks" Mobility   Outcome Measure  Help needed turning from your back to your side while in a flat bed without using bedrails?: None Help needed moving from lying on your back to sitting on the side of a flat bed without using bedrails?: A Little Help needed moving to and from a bed to a chair (including a wheelchair)?: A Little Help needed standing up from a chair using your arms (e.g., wheelchair or bedside chair)?: A Little Help needed to walk in hospital room?: A Little Help needed climbing 3-5 steps with a railing? : A Little 6 Click Score: 19    End of Session   Activity Tolerance: Patient tolerated treatment well Patient left: in chair;with call bell/phone within reach;with chair alarm set Nurse Communication: Mobility status PT Visit Diagnosis: Muscle weakness (generalized) (M62.81);Other abnormalities of gait and mobility (R26.89);Pain Pain - Right/Left: Left Pain - part of body: Knee     Time: 8466-5993 PT Time Calculation (min) (ACUTE ONLY): 15 min  Charges:  $Gait Training: 8-22 mins                     Julaine Fusi PTA 09/16/21, 10:16 AM

## 2021-09-16 NOTE — Plan of Care (Signed)
  Problem: Education: Goal: Knowledge of General Education information will improve Description: Including pain rating scale, medication(s)/side effects and non-pharmacologic comfort measures Outcome: Progressing   Problem: Health Behavior/Discharge Planning: Goal: Ability to manage health-related needs will improve Outcome: Progressing   Problem: Clinical Measurements: Goal: Ability to maintain clinical measurements within normal limits will improve Outcome: Progressing Goal: Will remain free from infection Outcome: Progressing Goal: Diagnostic test results will improve Outcome: Progressing Goal: Respiratory complications will improve Outcome: Progressing Goal: Cardiovascular complication will be avoided Outcome: Progressing   Problem: Activity: Goal: Risk for activity intolerance will decrease Outcome: Progressing   Problem: Nutrition: Goal: Adequate nutrition will be maintained Outcome: Progressing   Problem: Coping: Goal: Level of anxiety will decrease Outcome: Progressing   Problem: Elimination: Goal: Will not experience complications related to bowel motility Outcome: Progressing Goal: Will not experience complications related to urinary retention Outcome: Progressing   Problem: Pain Managment: Goal: General experience of comfort will improve Outcome: Progressing   Problem: Safety: Goal: Ability to remain free from injury will improve Outcome: Progressing   Problem: Skin Integrity: Goal: Risk for impaired skin integrity will decrease Outcome: Progressing   Problem: Education: Goal: Ability to describe self-care measures that may prevent or decrease complications (Diabetes Survival Skills Education) will improve Outcome: Progressing Goal: Individualized Educational Video(s) Outcome: Progressing   Problem: Coping: Goal: Ability to adjust to condition or change in health will improve Outcome: Progressing   Problem: Fluid Volume: Goal: Ability to  maintain a balanced intake and output will improve Outcome: Progressing   Problem: Health Behavior/Discharge Planning: Goal: Ability to identify and utilize available resources and services will improve Outcome: Progressing Goal: Ability to manage health-related needs will improve Outcome: Progressing   Problem: Metabolic: Goal: Ability to maintain appropriate glucose levels will improve Outcome: Progressing   Problem: Nutritional: Goal: Maintenance of adequate nutrition will improve Outcome: Progressing Goal: Progress toward achieving an optimal weight will improve Outcome: Progressing   Problem: Skin Integrity: Goal: Risk for impaired skin integrity will decrease Outcome: Progressing   Problem: Tissue Perfusion: Goal: Adequacy of tissue perfusion will improve Outcome: Progressing   Problem: Education: Goal: Knowledge of the prescribed therapeutic regimen will improve Outcome: Progressing Goal: Individualized Educational Video(s) Outcome: Progressing   Problem: Activity: Goal: Ability to avoid complications of mobility impairment will improve Outcome: Progressing Goal: Range of joint motion will improve Outcome: Progressing   Problem: Clinical Measurements: Goal: Postoperative complications will be avoided or minimized Outcome: Progressing   Problem: Pain Management: Goal: Pain level will decrease with appropriate interventions Outcome: Progressing   Problem: Skin Integrity: Goal: Will show signs of wound healing Outcome: Progressing

## 2021-09-16 NOTE — Treatment Plan (Addendum)
Diagnosis: MSSA PJI Baseline Creatinine <1    Allergies  Allergen Reactions   Suboxone [Buprenorphine Hcl-Naloxone Hcl] Nausea And Vomiting   Tape Other (See Comments)    Whelps *Paper Tape is ok    Codeine Rash, Itching and Nausea Only   Morphine And Related Itching and Rash   Other Rash    Telemetry electrodes   Silicone Rash    Whelps -*Paper Tape is ok     OPAT Orders Discharge antibiotics: Cefazolin 2 grams IV every 8 hours or  6 grams as a continuous infusion in 24 hrs ( to be decided by patient and infusion nurse based on price and convenience) And Rifampin 334m Po BID  Duration: 6 weeks  End Date:10/22/21   PIC Care Per Protocol:  Labs weekly while on IV antibiotics: _X_ CBC with differential  X__ CMP _X_ CRP _X_ ESR   _X_ Please pull PIC at completion of IV antibiotics   Fax weekly lab results  promptly to [phone] Clinic Follow Up Appt:10/07/21 at 11.30 am   Call [phone] any concerns or questions

## 2021-09-16 NOTE — Progress Notes (Signed)
Discussed with Dr Marry Guan, patient is ok to discharge from an orthopedic standpoint once he has had PICC line placed and is cleared by ID.

## 2021-09-17 ENCOUNTER — Inpatient Hospital Stay: Payer: Self-pay

## 2021-09-17 LAB — GLUCOSE, CAPILLARY
Glucose-Capillary: 175 mg/dL — ABNORMAL HIGH (ref 70–99)
Glucose-Capillary: 208 mg/dL — ABNORMAL HIGH (ref 70–99)

## 2021-09-17 MED ORDER — CEFAZOLIN IV (FOR PTA / DISCHARGE USE ONLY): 6.0000 g | INTRAVENOUS | 0 refills | Status: AC

## 2021-09-17 MED ORDER — SODIUM CHLORIDE 0.9% FLUSH: 10.0000 mL | INTRAVENOUS | Status: DC | PRN

## 2021-09-17 MED ORDER — CHLORHEXIDINE GLUCONATE CLOTH 2 % EX PADS
6.0000 | MEDICATED_PAD | Freq: Every day | CUTANEOUS | Status: DC
  Administered 2021-09-17: 6 via TOPICAL

## 2021-09-17 MED ORDER — RIFAMPIN 300 MG PO CAPS: 300.0000 mg | ORAL_CAPSULE | Freq: Two times a day (BID) | ORAL | 0 refills | Status: AC

## 2021-09-17 MED ORDER — SODIUM CHLORIDE 0.9% FLUSH
10.0000 mL | Freq: Two times a day (BID) | INTRAVENOUS | Status: DC
  Administered 2021-09-17: 10 mL

## 2021-09-17 MED ORDER — SODIUM CHLORIDE 0.9 % IV BOLUS
500.0000 mL | Freq: Once | INTRAVENOUS | Status: AC
  Administered 2021-09-17: 500 mL via INTRAVENOUS

## 2021-09-17 MED ORDER — ENOXAPARIN SODIUM 40 MG/0.4ML IJ SOSY: 40.0000 mg | PREFILLED_SYRINGE | INTRAMUSCULAR | 0 refills | Status: DC

## 2021-09-17 NOTE — TOC Progression Note (Signed)
Transition of Care [facility]) - Progression Note    Patient Details  Name: Erik Martinez MRN: [id number] Date of Birth: 06-06-1955  Transition of Care [facility]) CM/SW [place], RN Phone Number: 09/17/2021, 10:04 AM  Clinical Narrative:    [facility] will accept the patient for IV ABX with [place] as early as Sat, 09/18/21, I notified Pam at [facility] of the change in the ABX to Cefazolin    Expected Discharge Plan: [place] Barriers to Discharge: Continued Medical Work up  Expected Discharge Plan and Services Expected Discharge Plan: [place]                                               Social Determinants of Health (SDOH) Interventions    Readmission Risk Interventions     No data to display

## 2021-09-17 NOTE — Plan of Care (Signed)
Patient discharged per MD orders at this time.All dc instructions,education and medications reviewed with the patient.Pt expressed understanding and will comply with dc instructions.follow up appointments was also communicated to the patient.no verbal c/o or any ssx of distress.Pt was discharged home with HH/PT/Nursing infusion per order.Pt was transported home by wife in a privately owned vehicle.

## 2021-09-17 NOTE — Progress Notes (Signed)
PT Cancellation Note  Patient Details Name: Erik Martinez MRN: [id number] DOB: Mar 28, 1955   Cancelled Treatment:     PT attempt. Pt getting PICC. Will return shortly and continue to follow per current POC.    Willette Pa 09/17/2021, 10:10 AM

## 2021-09-17 NOTE — Progress Notes (Signed)
Physical Therapy Treatment Patient Details Name: Erik Martinez MRN: [id number] DOB: [birth year] Today's Date: 09/17/2021   History of Present Illness Pt is a 66 y/o M admitted on 09/10/21. Pt is s/p L knee revision 07/19/21 & 2 days prior to admission pt tripped & fell on L knee & developed severe pain & swelling followed by sudden onset of drainage. Pt underwent I&D of L knee with polyethylene exchnage & placement of antibiotic beads on 09/12/21 by Dr. Marry Guan.    PT Comments    Pt received PICC earlier this morning and PA switched pt to home wound vac. He continues to demonstrate improving strength and ROM but was encouraged to perform all prescribed exercises at DC. HHPT will be coming to work with pt at [place]. He required min assist to exit bed, but once seated EOB, only required supervision to stand and ambulate. " I am planning to sleep in my recliner at home." Overall pt continues to demonstrate safe steady gait and is cleared from an acute PT standpoint for safe DC home with HHPT + IV antibiotics.    Recommendations for follow up therapy are one component of a multi-disciplinary discharge planning process, led by the attending physician.  Recommendations may be updated based on patient status, additional functional criteria and insurance authorization.  Follow Up Recommendations  Home health PT     Assistance Recommended at Discharge PRN  Patient can return home with the following A little help with walking and/or transfers;A little help with bathing/dressing/bathroom;Assistance with cooking/housework;Assist for transportation;Help with stairs or ramp for entrance   Equipment Recommendations  None recommended by PT       Precautions / Restrictions Precautions Precautions: Fall Restrictions Weight Bearing Restrictions: Yes LLE Weight Bearing: Weight bearing as tolerated     Mobility  Bed Mobility Overal bed mobility: Needs Assistance Bed Mobility: Supine to Sit  Supine to sit: Min  assist    General bed mobility comments: pt required min assist to exit R side of bed. He states he will have someone help him at home but is planning to sleep in a recliner. Reviewed importance of positioning and keep knee in extension to promote HS stretch/ promote full ROM    Transfers Overall transfer level: Needs assistance Equipment used: Rolling walker (2 wheels) Transfers: Sit to/from Stand Sit to Stand: Supervision     Ambulation/Gait Ambulation/Gait assistance: Supervision Gait Distance (Feet): 200 Feet Assistive device: Rolling walker (2 wheels) Gait Pattern/deviations: Step-through pattern, Antalgic Gait velocity: decrease     General Gait Details: no LOB or safety concerns during ambulation ~ 200 ft with RW   Stairs    General stair comments: pt has ramp entry to home. Reviewed proper stair sequencing and pt able to state understanding    Balance Overall balance assessment: Needs assistance Sitting-balance support: Feet supported Sitting balance-Leahy Scale: Good     Standing balance support: During functional activity, Bilateral upper extremity supported Standing balance-Leahy Scale: Good       Cognition Arousal/Alertness: Awake/alert Behavior During Therapy: WFL for tasks assessed/performed Overall Cognitive Status: Within Functional Limits for tasks assessed      General Comments: pt is A and O x 4. Cooperative           General Comments General comments (skin integrity, edema, etc.): author reviewed positioning, importance of polar care+ wound vac, and need to continue to improve ROM/stregth but performing HEP handout. He states and demonstrates understanding throughout.      Pertinent Vitals/Pain Pain Assessment  Pain Assessment: 0-10 Pain Score: 6  Pain Location: Knee Pain Descriptors / Indicators: Discomfort, Grimacing Pain Intervention(s): Limited activity within patient's tolerance, Monitored during session, Premedicated before session,  Repositioned, Ice applied     PT Goals (current goals can now be found in the care plan section) Acute Rehab PT Goals Patient Stated Goal: home at DC Progress towards PT goals: Progressing toward goals    Frequency    BID      PT Plan Current plan remains appropriate       AM-PAC PT "6 Clicks" Mobility   Outcome Measure  Help needed turning from your back to your side while in a flat bed without using bedrails?: None Help needed moving from lying on your back to sitting on the side of a flat bed without using bedrails?: A Little Help needed moving to and from a bed to a chair (including a wheelchair)?: A Little Help needed standing up from a chair using your arms (e.g., wheelchair or bedside chair)?: A Little Help needed to walk in hospital room?: A Little Help needed climbing 3-5 steps with a railing? : A Little 6 Click Score: 19    End of Session   Activity Tolerance: Patient tolerated treatment well Patient left: in chair;with call bell/phone within reach;with chair alarm set Nurse Communication: Mobility status PT Visit Diagnosis: Muscle weakness (generalized) (M62.81);Other abnormalities of gait and mobility (R26.89);Pain Pain - Right/Left: Left Pain - part of body: Knee     Time: 2706-2376 PT Time Calculation (min) (ACUTE ONLY): 24 min  Charges:  $Gait Training: 8-22 mins $Therapeutic Exercise: 8-22 mins                     Julaine Fusi PTA 09/17/21, 11:21 AM
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
## 2021-09-17 NOTE — Progress Notes (Signed)
Peripherally Inserted Central Catheter Placement  The IV Nurse has discussed with the patient and/or persons authorized to consent for the patient, the purpose of this procedure and the potential benefits and risks involved with this procedure.  The benefits include less needle sticks, lab draws from the catheter, and the patient may be discharged home with the catheter. Risks include, but not limited to, infection, bleeding, blood clot (thrombus formation), and puncture of an artery; nerve damage and irregular heartbeat and possibility to perform a PICC exchange if needed/ordered by physician.  Alternatives to this procedure were also discussed.  Bard Power PICC patient education guide, fact sheet on infection prevention and patient information card has been provided to patient /or left at bedside.    PICC Placement Documentation  PICC Single Lumen 57/90/38 Right Basilic 38 cm 0 cm (Active)  Indication for Insertion or Continuance of Line Home intravenous therapies (PICC only) 09/17/21 1002  Exposed Catheter (cm) 0 cm 09/17/21 1002  Site Assessment Clean, Dry, Intact 09/17/21 1002  Line Status Flushed;Saline locked;Blood return noted 09/17/21 1002  Dressing Type Transparent;Securing device 09/17/21 1002  Dressing Status Antimicrobial disc in place;Clean, Dry, Intact 09/17/21 1002  Safety Lock Not Applicable 33/38/32 9191  Dressing Intervention New dressing;Other (Comment) 09/17/21 1002  Dressing Change Due 09/24/21 09/17/21 1002       Enos Fling 09/17/2021, 10:03 AM

## 2021-09-17 NOTE — Progress Notes (Signed)
  Subjective: 6 Days Post-Op Procedure(s) (LRB): IRRIGATION AND DEBRIDEMENT WITH POLY EXCHANGE LEFT KNEE (Left) APPLICATION OF WOUND VAC (Left) Patient reports pain as moderate.  States he has been able to get pain medicine when requested.  Patient is well, and has had no acute complaints or problems Plan is to go Home after hospital stay. Negative for chest pain and shortness of breath Fever: no Gastrointestinal: negative for nausea and vomiting.   Patient has had a bowel movement.  Objective: Vital signs in last 24 hours: Temp:  [98.4 F (36.9 C)-98.7 F (37.1 C)] 98.4 F (36.9 C) (09/07 2337) Pulse Rate:  [72-74] 72 (09/07 2337) Resp:  [17-20] 20 (09/07 2337) BP: (142-143)/(61-65) 142/65 (09/07 2337) SpO2:  [97 %-98 %] 98 % (09/07 2337)  Intake/Output from previous day:  Intake/Output Summary (Last 24 hours) at 09/17/2021 0832 Last data filed at 09/17/2021 0227 Gross per 24 hour  Intake 908.33 ml  Output 1450 ml  Net -541.67 ml    Intake/Output this shift: No intake/output data recorded.  Labs: Recent Labs    09/16/21 0627  HGB 10.5*   Recent Labs    09/16/21 0627  WBC 12.8*  RBC 4.18*  HCT 32.3*  PLT 285   Recent Labs    09/16/21 0627 09/16/21 2008  NA 136  --   K 2.9* 3.7  CL 102  --   CO2 20*  --   BUN 33*  --   CREATININE 1.35*  --   GLUCOSE 176*  --   CALCIUM 9.3  --    No results for input(s): "LABPT", "INR" in the last 72 hours.   EXAM General - Patient is Alert, Appropriate, and Oriented Extremity - Neurovascular intact Dorsiflexion/Plantar flexion intact Compartment soft Dressing/Incision -Prevena intact, ~50 cc drainage in canister, unchanged from previous Motor Function - intact, moving foot and toes well on exam.   Cardiovascular- Regular rate and rhythm, no murmurs/rubs/gallops Respiratory- Lungs clear to auscultation bilaterally Gastrointestinal- soft and nontender  Bilateral heels evaluated for skin breakdown, no pressure  injury to heels noted   Assessment/Plan: 6 Days Post-Op Procedure(s) (LRB): IRRIGATION AND DEBRIDEMENT WITH POLY EXCHANGE LEFT KNEE (Left) APPLICATION OF WOUND VAC (Left) Principal Problem:   Wound drainage  Estimated body mass index is 28.41 kg/m as calculated from the following:   Height as of this encounter: '5\' 10"'$  (1.778 m).   Weight as of this encounter: 89.8 kg. Advance diet Up with therapy  Order placed for PICC line by Dr. Marry Guan.   Prevena changed over to home unit. Patient educated on use. Patient advised I had spoken with pain management office and he is aware he is to contact them today and notify them of his impending discharge. Patient will be ready for d/c after PICC placement.      DVT Prophylaxis - Lovenox, Ted hose, and SCDs Weight-Bearing as tolerated to left leg  Cassell Smiles, PA-C [facility] 09/17/2021, 8:32 AM

## 2021-09-17 NOTE — Progress Notes (Signed)
PHARMACY CONSULT NOTE FOR:  OUTPATIENT  PARENTERAL ANTIBIOTIC THERAPY (OPAT)  Indication: MSSA prosthetic knee infection Regimen:  cefazolin 6gm daily as continuous infusion over 24hr or may give cefazolin 2gm IV q8h  Also oral therapy - Rifampin 31m PO BID End date: 10/22/2021  Labs - Once weekly:  CBC/D, CMP, ESR and CRP Please pull PIC at completion of IV antibiotics Fax weekly lab results  promptly to [phone] IV antibiotic discharge orders are pended. To discharging provider:  please sign these orders via discharge navigator,  Select New Orders & click on the button choice - Manage This Unsigned Work.     Thank you for allowing pharmacy to be a part of this patient's care.  DDoreene Eland PharmD, BCPS, BCIDP Work Cell: [phone]/08/2021 10:01 AM

## 2021-09-19 DIAGNOSIS — G4709 Other insomnia: Secondary | ICD-10-CM | POA: Diagnosis not present

## 2021-09-19 DIAGNOSIS — Z7952 Long term (current) use of systemic steroids: Secondary | ICD-10-CM | POA: Diagnosis not present

## 2021-09-19 DIAGNOSIS — M545 Low back pain, unspecified: Secondary | ICD-10-CM | POA: Diagnosis not present

## 2021-09-19 DIAGNOSIS — I2510 Atherosclerotic heart disease of native coronary artery without angina pectoris: Secondary | ICD-10-CM | POA: Diagnosis not present

## 2021-09-19 DIAGNOSIS — T8454XA Infection and inflammatory reaction due to internal left knee prosthesis, initial encounter: Secondary | ICD-10-CM | POA: Diagnosis not present

## 2021-09-19 DIAGNOSIS — F4310 Post-traumatic stress disorder, unspecified: Secondary | ICD-10-CM | POA: Diagnosis not present

## 2021-09-19 DIAGNOSIS — M5136 Other intervertebral disc degeneration, lumbar region: Secondary | ICD-10-CM | POA: Diagnosis not present

## 2021-09-19 DIAGNOSIS — E1169 Type 2 diabetes mellitus with other specified complication: Secondary | ICD-10-CM | POA: Diagnosis not present

## 2021-09-19 DIAGNOSIS — Z792 Long term (current) use of antibiotics: Secondary | ICD-10-CM | POA: Diagnosis not present

## 2021-09-19 DIAGNOSIS — E1142 Type 2 diabetes mellitus with diabetic polyneuropathy: Secondary | ICD-10-CM | POA: Diagnosis not present

## 2021-09-19 DIAGNOSIS — E7849 Other hyperlipidemia: Secondary | ICD-10-CM | POA: Diagnosis not present

## 2021-09-19 DIAGNOSIS — M5030 Other cervical disc degeneration, unspecified cervical region: Secondary | ICD-10-CM | POA: Diagnosis not present

## 2021-09-19 DIAGNOSIS — R3915 Urgency of urination: Secondary | ICD-10-CM | POA: Diagnosis not present

## 2021-09-19 DIAGNOSIS — N529 Male erectile dysfunction, unspecified: Secondary | ICD-10-CM | POA: Diagnosis not present

## 2021-09-19 DIAGNOSIS — F411 Generalized anxiety disorder: Secondary | ICD-10-CM | POA: Diagnosis not present

## 2021-09-19 DIAGNOSIS — M1990 Unspecified osteoarthritis, unspecified site: Secondary | ICD-10-CM | POA: Diagnosis not present

## 2021-09-19 DIAGNOSIS — Z7984 Long term (current) use of oral hypoglycemic drugs: Secondary | ICD-10-CM | POA: Diagnosis not present

## 2021-09-19 DIAGNOSIS — E1151 Type 2 diabetes mellitus with diabetic peripheral angiopathy without gangrene: Secondary | ICD-10-CM | POA: Diagnosis not present

## 2021-09-19 DIAGNOSIS — J449 Chronic obstructive pulmonary disease, unspecified: Secondary | ICD-10-CM | POA: Diagnosis not present

## 2021-09-19 DIAGNOSIS — G894 Chronic pain syndrome: Secondary | ICD-10-CM | POA: Diagnosis not present

## 2021-09-19 DIAGNOSIS — N319 Neuromuscular dysfunction of bladder, unspecified: Secondary | ICD-10-CM | POA: Diagnosis not present

## 2021-09-19 DIAGNOSIS — K760 Fatty (change of) liver, not elsewhere classified: Secondary | ICD-10-CM | POA: Diagnosis not present

## 2021-09-19 DIAGNOSIS — K219 Gastro-esophageal reflux disease without esophagitis: Secondary | ICD-10-CM | POA: Diagnosis not present

## 2021-09-19 DIAGNOSIS — F319 Bipolar disorder, unspecified: Secondary | ICD-10-CM | POA: Diagnosis not present

## 2021-09-19 LAB — CULTURE, BLOOD (ROUTINE X 2)
Culture: NO GROWTH
Culture: NO GROWTH
Special Requests: ADEQUATE
Special Requests: ADEQUATE

## 2021-09-20 DIAGNOSIS — M25512 Pain in left shoulder: Secondary | ICD-10-CM | POA: Diagnosis not present

## 2021-09-20 DIAGNOSIS — M545 Low back pain, unspecified: Secondary | ICD-10-CM | POA: Diagnosis not present

## 2021-09-20 DIAGNOSIS — M160 Bilateral primary osteoarthritis of hip: Secondary | ICD-10-CM | POA: Diagnosis not present

## 2021-09-20 DIAGNOSIS — G890 Central pain syndrome: Secondary | ICD-10-CM | POA: Diagnosis not present

## 2021-09-20 DIAGNOSIS — M4802 Spinal stenosis, cervical region: Secondary | ICD-10-CM | POA: Diagnosis not present

## 2021-09-20 DIAGNOSIS — M5136 Other intervertebral disc degeneration, lumbar region: Secondary | ICD-10-CM | POA: Diagnosis not present

## 2021-09-20 DIAGNOSIS — Z981 Arthrodesis status: Secondary | ICD-10-CM | POA: Diagnosis not present

## 2021-09-22 ENCOUNTER — Other Ambulatory Visit: Payer: Self-pay | Admitting: Family Medicine

## 2021-09-22 DIAGNOSIS — G4700 Insomnia, unspecified: Secondary | ICD-10-CM

## 2021-09-22 MED ORDER — ZOLPIDEM TARTRATE 10 MG PO TABS: 10.0000 mg | ORAL_TABLET | Freq: Every evening | ORAL | 2 refills | Status: DC | PRN

## 2021-09-22 NOTE — Telephone Encounter (Signed)
Last office visit 08/06/21 with Erik Martinez for rash.  Last refilled 07/05/21 for #30 with 2 refills.  Next appt: 09/28/21 for DM.

## 2021-09-22 NOTE — Telephone Encounter (Signed)
  Encourage patient to contact the pharmacy for refills or they can request refills through [place]:  Please schedule appointment if longer than 1 year  NEXT APPOINTMENT DATE:  MEDICATION:zolpidem (AMBIEN) 10 MG tablet  Is the patient out of medication?   [place]  Let patient know to contact pharmacy at the end of the day to make sure medication is ready.  Please notify patient to allow 48-72 hours to process  CLINICAL FILLS OUT ALL BELOW:   LAST REFILL:  QTY:  REFILL DATE:    OTHER COMMENTS:    Okay for refill?  Please advise

## 2021-09-25 DIAGNOSIS — M87059 Idiopathic aseptic necrosis of unspecified femur: Secondary | ICD-10-CM | POA: Diagnosis not present

## 2021-09-27 DIAGNOSIS — M545 Low back pain, unspecified: Secondary | ICD-10-CM | POA: Diagnosis not present

## 2021-09-27 DIAGNOSIS — Z792 Long term (current) use of antibiotics: Secondary | ICD-10-CM | POA: Diagnosis not present

## 2021-09-27 DIAGNOSIS — G4709 Other insomnia: Secondary | ICD-10-CM | POA: Diagnosis not present

## 2021-09-27 DIAGNOSIS — E1169 Type 2 diabetes mellitus with other specified complication: Secondary | ICD-10-CM | POA: Diagnosis not present

## 2021-09-27 DIAGNOSIS — E1151 Type 2 diabetes mellitus with diabetic peripheral angiopathy without gangrene: Secondary | ICD-10-CM | POA: Diagnosis not present

## 2021-09-27 DIAGNOSIS — M5136 Other intervertebral disc degeneration, lumbar region: Secondary | ICD-10-CM | POA: Diagnosis not present

## 2021-09-27 DIAGNOSIS — J449 Chronic obstructive pulmonary disease, unspecified: Secondary | ICD-10-CM | POA: Diagnosis not present

## 2021-09-27 DIAGNOSIS — E7849 Other hyperlipidemia: Secondary | ICD-10-CM | POA: Diagnosis not present

## 2021-09-27 DIAGNOSIS — N529 Male erectile dysfunction, unspecified: Secondary | ICD-10-CM | POA: Diagnosis not present

## 2021-09-27 DIAGNOSIS — K760 Fatty (change of) liver, not elsewhere classified: Secondary | ICD-10-CM | POA: Diagnosis not present

## 2021-09-27 DIAGNOSIS — G894 Chronic pain syndrome: Secondary | ICD-10-CM | POA: Diagnosis not present

## 2021-09-27 DIAGNOSIS — M1990 Unspecified osteoarthritis, unspecified site: Secondary | ICD-10-CM | POA: Diagnosis not present

## 2021-09-27 DIAGNOSIS — R3915 Urgency of urination: Secondary | ICD-10-CM | POA: Diagnosis not present

## 2021-09-27 DIAGNOSIS — F319 Bipolar disorder, unspecified: Secondary | ICD-10-CM | POA: Diagnosis not present

## 2021-09-27 DIAGNOSIS — E1142 Type 2 diabetes mellitus with diabetic polyneuropathy: Secondary | ICD-10-CM | POA: Diagnosis not present

## 2021-09-27 DIAGNOSIS — Z7952 Long term (current) use of systemic steroids: Secondary | ICD-10-CM | POA: Diagnosis not present

## 2021-09-27 DIAGNOSIS — M5030 Other cervical disc degeneration, unspecified cervical region: Secondary | ICD-10-CM | POA: Diagnosis not present

## 2021-09-27 DIAGNOSIS — I2510 Atherosclerotic heart disease of native coronary artery without angina pectoris: Secondary | ICD-10-CM | POA: Diagnosis not present

## 2021-09-27 DIAGNOSIS — Z7984 Long term (current) use of oral hypoglycemic drugs: Secondary | ICD-10-CM | POA: Diagnosis not present

## 2021-09-27 DIAGNOSIS — F411 Generalized anxiety disorder: Secondary | ICD-10-CM | POA: Diagnosis not present

## 2021-09-27 DIAGNOSIS — F4310 Post-traumatic stress disorder, unspecified: Secondary | ICD-10-CM | POA: Diagnosis not present

## 2021-09-27 DIAGNOSIS — T8454XA Infection and inflammatory reaction due to internal left knee prosthesis, initial encounter: Secondary | ICD-10-CM | POA: Diagnosis not present

## 2021-09-27 DIAGNOSIS — K219 Gastro-esophageal reflux disease without esophagitis: Secondary | ICD-10-CM | POA: Diagnosis not present

## 2021-09-27 DIAGNOSIS — N319 Neuromuscular dysfunction of bladder, unspecified: Secondary | ICD-10-CM | POA: Diagnosis not present

## 2021-09-28 ENCOUNTER — Ambulatory Visit: Payer: PPO | Admitting: Family Medicine

## 2021-09-28 DIAGNOSIS — J449 Chronic obstructive pulmonary disease, unspecified: Secondary | ICD-10-CM | POA: Diagnosis not present

## 2021-09-28 DIAGNOSIS — Z7952 Long term (current) use of systemic steroids: Secondary | ICD-10-CM | POA: Diagnosis not present

## 2021-09-28 DIAGNOSIS — E1169 Type 2 diabetes mellitus with other specified complication: Secondary | ICD-10-CM | POA: Diagnosis not present

## 2021-09-28 DIAGNOSIS — M545 Low back pain, unspecified: Secondary | ICD-10-CM | POA: Diagnosis not present

## 2021-09-28 DIAGNOSIS — K760 Fatty (change of) liver, not elsewhere classified: Secondary | ICD-10-CM | POA: Diagnosis not present

## 2021-09-28 DIAGNOSIS — K219 Gastro-esophageal reflux disease without esophagitis: Secondary | ICD-10-CM | POA: Diagnosis not present

## 2021-09-28 DIAGNOSIS — T8454XA Infection and inflammatory reaction due to internal left knee prosthesis, initial encounter: Secondary | ICD-10-CM | POA: Diagnosis not present

## 2021-09-28 DIAGNOSIS — F319 Bipolar disorder, unspecified: Secondary | ICD-10-CM | POA: Diagnosis not present

## 2021-09-28 DIAGNOSIS — M5030 Other cervical disc degeneration, unspecified cervical region: Secondary | ICD-10-CM | POA: Diagnosis not present

## 2021-09-28 DIAGNOSIS — Z7984 Long term (current) use of oral hypoglycemic drugs: Secondary | ICD-10-CM | POA: Diagnosis not present

## 2021-09-28 DIAGNOSIS — R3915 Urgency of urination: Secondary | ICD-10-CM | POA: Diagnosis not present

## 2021-09-28 DIAGNOSIS — E7849 Other hyperlipidemia: Secondary | ICD-10-CM | POA: Diagnosis not present

## 2021-09-28 DIAGNOSIS — N319 Neuromuscular dysfunction of bladder, unspecified: Secondary | ICD-10-CM | POA: Diagnosis not present

## 2021-09-28 DIAGNOSIS — I2510 Atherosclerotic heart disease of native coronary artery without angina pectoris: Secondary | ICD-10-CM | POA: Diagnosis not present

## 2021-09-28 DIAGNOSIS — E1151 Type 2 diabetes mellitus with diabetic peripheral angiopathy without gangrene: Secondary | ICD-10-CM | POA: Diagnosis not present

## 2021-09-28 DIAGNOSIS — Z792 Long term (current) use of antibiotics: Secondary | ICD-10-CM | POA: Diagnosis not present

## 2021-09-28 DIAGNOSIS — F4310 Post-traumatic stress disorder, unspecified: Secondary | ICD-10-CM | POA: Diagnosis not present

## 2021-09-28 DIAGNOSIS — N529 Male erectile dysfunction, unspecified: Secondary | ICD-10-CM | POA: Diagnosis not present

## 2021-09-28 DIAGNOSIS — M5136 Other intervertebral disc degeneration, lumbar region: Secondary | ICD-10-CM | POA: Diagnosis not present

## 2021-09-28 DIAGNOSIS — G894 Chronic pain syndrome: Secondary | ICD-10-CM | POA: Diagnosis not present

## 2021-09-28 DIAGNOSIS — G4709 Other insomnia: Secondary | ICD-10-CM | POA: Diagnosis not present

## 2021-09-28 DIAGNOSIS — E1142 Type 2 diabetes mellitus with diabetic polyneuropathy: Secondary | ICD-10-CM | POA: Diagnosis not present

## 2021-09-28 DIAGNOSIS — M1990 Unspecified osteoarthritis, unspecified site: Secondary | ICD-10-CM | POA: Diagnosis not present

## 2021-09-28 DIAGNOSIS — F411 Generalized anxiety disorder: Secondary | ICD-10-CM | POA: Diagnosis not present

## 2021-09-29 DIAGNOSIS — M5030 Other cervical disc degeneration, unspecified cervical region: Secondary | ICD-10-CM | POA: Diagnosis not present

## 2021-09-29 DIAGNOSIS — T8454XA Infection and inflammatory reaction due to internal left knee prosthesis, initial encounter: Secondary | ICD-10-CM | POA: Diagnosis not present

## 2021-09-29 DIAGNOSIS — Z7984 Long term (current) use of oral hypoglycemic drugs: Secondary | ICD-10-CM | POA: Diagnosis not present

## 2021-09-29 DIAGNOSIS — E7849 Other hyperlipidemia: Secondary | ICD-10-CM | POA: Diagnosis not present

## 2021-09-29 DIAGNOSIS — J449 Chronic obstructive pulmonary disease, unspecified: Secondary | ICD-10-CM | POA: Diagnosis not present

## 2021-09-29 DIAGNOSIS — I2510 Atherosclerotic heart disease of native coronary artery without angina pectoris: Secondary | ICD-10-CM | POA: Diagnosis not present

## 2021-09-29 DIAGNOSIS — M5136 Other intervertebral disc degeneration, lumbar region: Secondary | ICD-10-CM | POA: Diagnosis not present

## 2021-09-29 DIAGNOSIS — N319 Neuromuscular dysfunction of bladder, unspecified: Secondary | ICD-10-CM | POA: Diagnosis not present

## 2021-09-29 DIAGNOSIS — N529 Male erectile dysfunction, unspecified: Secondary | ICD-10-CM | POA: Diagnosis not present

## 2021-09-29 DIAGNOSIS — Z7952 Long term (current) use of systemic steroids: Secondary | ICD-10-CM | POA: Diagnosis not present

## 2021-09-29 DIAGNOSIS — E1169 Type 2 diabetes mellitus with other specified complication: Secondary | ICD-10-CM | POA: Diagnosis not present

## 2021-09-29 DIAGNOSIS — M545 Low back pain, unspecified: Secondary | ICD-10-CM | POA: Diagnosis not present

## 2021-09-29 DIAGNOSIS — K219 Gastro-esophageal reflux disease without esophagitis: Secondary | ICD-10-CM | POA: Diagnosis not present

## 2021-09-29 DIAGNOSIS — F4310 Post-traumatic stress disorder, unspecified: Secondary | ICD-10-CM | POA: Diagnosis not present

## 2021-09-29 DIAGNOSIS — G894 Chronic pain syndrome: Secondary | ICD-10-CM | POA: Diagnosis not present

## 2021-09-29 DIAGNOSIS — E1142 Type 2 diabetes mellitus with diabetic polyneuropathy: Secondary | ICD-10-CM | POA: Diagnosis not present

## 2021-09-29 DIAGNOSIS — R3915 Urgency of urination: Secondary | ICD-10-CM | POA: Diagnosis not present

## 2021-09-29 DIAGNOSIS — E1151 Type 2 diabetes mellitus with diabetic peripheral angiopathy without gangrene: Secondary | ICD-10-CM | POA: Diagnosis not present

## 2021-09-29 DIAGNOSIS — K760 Fatty (change of) liver, not elsewhere classified: Secondary | ICD-10-CM | POA: Diagnosis not present

## 2021-09-29 DIAGNOSIS — Z792 Long term (current) use of antibiotics: Secondary | ICD-10-CM | POA: Diagnosis not present

## 2021-09-29 DIAGNOSIS — M1990 Unspecified osteoarthritis, unspecified site: Secondary | ICD-10-CM | POA: Diagnosis not present

## 2021-09-29 DIAGNOSIS — G4709 Other insomnia: Secondary | ICD-10-CM | POA: Diagnosis not present

## 2021-09-29 DIAGNOSIS — F319 Bipolar disorder, unspecified: Secondary | ICD-10-CM | POA: Diagnosis not present

## 2021-09-29 DIAGNOSIS — F411 Generalized anxiety disorder: Secondary | ICD-10-CM | POA: Diagnosis not present

## 2021-09-30 ENCOUNTER — Telehealth: Payer: Self-pay | Admitting: Family Medicine

## 2021-09-30 ENCOUNTER — Encounter: Payer: Self-pay | Admitting: Family Medicine

## 2021-09-30 DIAGNOSIS — F1990 Other psychoactive substance use, unspecified, uncomplicated: Secondary | ICD-10-CM | POA: Insufficient documentation

## 2021-09-30 NOTE — Telephone Encounter (Signed)
Wells Guiles from [facility] called to let Dr Diona Browner know that she will continue to  fill medications Lyrica and Ambien for patient.

## 2021-09-30 NOTE — Telephone Encounter (Signed)
Wells Guiles from [facility] on [address] called in and stated that the patient is double dipping. She stated that he is getting pregabalin (LYRICA) 300 MG capsule from here and from the [place]. She informed patient he will have to stop but didn't so he is no longer able to refill at there location. She stated that she has deactivated the Pregabalin that was ordered by Dr. Lorelei Pont and the Ambien. She stated that she completed a refusal to fill form. Thank you!

## 2021-09-30 NOTE — Telephone Encounter (Signed)
FYI  Concerns on PDMP.  Notified by pharmacy that he has received Lyrica from both our office and the [place].  I have notified him via MyChart that no further controlled substances will be prescribed from our office.  I welcomed him to make an appointment to discuss this and for further continued care of his other health issues.

## 2021-10-01 DIAGNOSIS — F319 Bipolar disorder, unspecified: Secondary | ICD-10-CM | POA: Diagnosis not present

## 2021-10-01 DIAGNOSIS — M545 Low back pain, unspecified: Secondary | ICD-10-CM | POA: Diagnosis not present

## 2021-10-01 DIAGNOSIS — E1142 Type 2 diabetes mellitus with diabetic polyneuropathy: Secondary | ICD-10-CM | POA: Diagnosis not present

## 2021-10-01 DIAGNOSIS — M5136 Other intervertebral disc degeneration, lumbar region: Secondary | ICD-10-CM | POA: Diagnosis not present

## 2021-10-01 DIAGNOSIS — M5030 Other cervical disc degeneration, unspecified cervical region: Secondary | ICD-10-CM | POA: Diagnosis not present

## 2021-10-01 DIAGNOSIS — K760 Fatty (change of) liver, not elsewhere classified: Secondary | ICD-10-CM | POA: Diagnosis not present

## 2021-10-01 DIAGNOSIS — F411 Generalized anxiety disorder: Secondary | ICD-10-CM | POA: Diagnosis not present

## 2021-10-01 DIAGNOSIS — G894 Chronic pain syndrome: Secondary | ICD-10-CM | POA: Diagnosis not present

## 2021-10-01 DIAGNOSIS — J449 Chronic obstructive pulmonary disease, unspecified: Secondary | ICD-10-CM | POA: Diagnosis not present

## 2021-10-01 DIAGNOSIS — M1990 Unspecified osteoarthritis, unspecified site: Secondary | ICD-10-CM | POA: Diagnosis not present

## 2021-10-01 DIAGNOSIS — E7849 Other hyperlipidemia: Secondary | ICD-10-CM | POA: Diagnosis not present

## 2021-10-01 DIAGNOSIS — R3915 Urgency of urination: Secondary | ICD-10-CM | POA: Diagnosis not present

## 2021-10-01 DIAGNOSIS — K219 Gastro-esophageal reflux disease without esophagitis: Secondary | ICD-10-CM | POA: Diagnosis not present

## 2021-10-01 DIAGNOSIS — E1151 Type 2 diabetes mellitus with diabetic peripheral angiopathy without gangrene: Secondary | ICD-10-CM | POA: Diagnosis not present

## 2021-10-01 DIAGNOSIS — Z7952 Long term (current) use of systemic steroids: Secondary | ICD-10-CM | POA: Diagnosis not present

## 2021-10-01 DIAGNOSIS — N319 Neuromuscular dysfunction of bladder, unspecified: Secondary | ICD-10-CM | POA: Diagnosis not present

## 2021-10-01 DIAGNOSIS — Z792 Long term (current) use of antibiotics: Secondary | ICD-10-CM | POA: Diagnosis not present

## 2021-10-01 DIAGNOSIS — I2510 Atherosclerotic heart disease of native coronary artery without angina pectoris: Secondary | ICD-10-CM | POA: Diagnosis not present

## 2021-10-01 DIAGNOSIS — Z7984 Long term (current) use of oral hypoglycemic drugs: Secondary | ICD-10-CM | POA: Diagnosis not present

## 2021-10-01 DIAGNOSIS — E1169 Type 2 diabetes mellitus with other specified complication: Secondary | ICD-10-CM | POA: Diagnosis not present

## 2021-10-01 DIAGNOSIS — T8454XA Infection and inflammatory reaction due to internal left knee prosthesis, initial encounter: Secondary | ICD-10-CM | POA: Diagnosis not present

## 2021-10-01 DIAGNOSIS — G4709 Other insomnia: Secondary | ICD-10-CM | POA: Diagnosis not present

## 2021-10-01 DIAGNOSIS — N529 Male erectile dysfunction, unspecified: Secondary | ICD-10-CM | POA: Diagnosis not present

## 2021-10-01 DIAGNOSIS — F4310 Post-traumatic stress disorder, unspecified: Secondary | ICD-10-CM | POA: Diagnosis not present

## 2021-10-01 NOTE — Telephone Encounter (Signed)
Let her Pharmacy know given  he is getting both controlled substances from here and [place]... [facility] out.

## 2021-10-01 NOTE — Telephone Encounter (Signed)
Left message for Erik Martinez at [facility] that given Erik Martinez is getting both controlled substances from here and the [place]... Dr[facility] Diona Browner [facility] out.  I ask Erik Martinez to call me back if she has any questions.

## 2021-10-02 DIAGNOSIS — F4310 Post-traumatic stress disorder, unspecified: Secondary | ICD-10-CM | POA: Diagnosis not present

## 2021-10-02 DIAGNOSIS — E663 Overweight: Secondary | ICD-10-CM | POA: Diagnosis not present

## 2021-10-02 DIAGNOSIS — I10 Essential (primary) hypertension: Secondary | ICD-10-CM | POA: Diagnosis not present

## 2021-10-02 DIAGNOSIS — Z7902 Long term (current) use of antithrombotics/antiplatelets: Secondary | ICD-10-CM | POA: Diagnosis not present

## 2021-10-02 DIAGNOSIS — F1411 Cocaine abuse, in remission: Secondary | ICD-10-CM | POA: Diagnosis not present

## 2021-10-02 DIAGNOSIS — E1142 Type 2 diabetes mellitus with diabetic polyneuropathy: Secondary | ICD-10-CM | POA: Diagnosis not present

## 2021-10-02 DIAGNOSIS — K219 Gastro-esophageal reflux disease without esophagitis: Secondary | ICD-10-CM | POA: Diagnosis not present

## 2021-10-02 DIAGNOSIS — E785 Hyperlipidemia, unspecified: Secondary | ICD-10-CM | POA: Diagnosis not present

## 2021-10-02 DIAGNOSIS — G8929 Other chronic pain: Secondary | ICD-10-CM | POA: Diagnosis not present

## 2021-10-02 DIAGNOSIS — M87059 Idiopathic aseptic necrosis of unspecified femur: Secondary | ICD-10-CM | POA: Diagnosis not present

## 2021-10-02 DIAGNOSIS — G4700 Insomnia, unspecified: Secondary | ICD-10-CM | POA: Diagnosis not present

## 2021-10-02 DIAGNOSIS — E1169 Type 2 diabetes mellitus with other specified complication: Secondary | ICD-10-CM | POA: Diagnosis not present

## 2021-10-02 DIAGNOSIS — M1990 Unspecified osteoarthritis, unspecified site: Secondary | ICD-10-CM | POA: Diagnosis not present

## 2021-10-04 DIAGNOSIS — Z792 Long term (current) use of antibiotics: Secondary | ICD-10-CM | POA: Diagnosis not present

## 2021-10-04 DIAGNOSIS — M5030 Other cervical disc degeneration, unspecified cervical region: Secondary | ICD-10-CM | POA: Diagnosis not present

## 2021-10-04 DIAGNOSIS — E1142 Type 2 diabetes mellitus with diabetic polyneuropathy: Secondary | ICD-10-CM | POA: Diagnosis not present

## 2021-10-04 DIAGNOSIS — E1169 Type 2 diabetes mellitus with other specified complication: Secondary | ICD-10-CM | POA: Diagnosis not present

## 2021-10-04 DIAGNOSIS — T8454XA Infection and inflammatory reaction due to internal left knee prosthesis, initial encounter: Secondary | ICD-10-CM | POA: Diagnosis not present

## 2021-10-04 DIAGNOSIS — F411 Generalized anxiety disorder: Secondary | ICD-10-CM | POA: Diagnosis not present

## 2021-10-04 DIAGNOSIS — G4709 Other insomnia: Secondary | ICD-10-CM | POA: Diagnosis not present

## 2021-10-04 DIAGNOSIS — F4310 Post-traumatic stress disorder, unspecified: Secondary | ICD-10-CM | POA: Diagnosis not present

## 2021-10-04 DIAGNOSIS — M5136 Other intervertebral disc degeneration, lumbar region: Secondary | ICD-10-CM | POA: Diagnosis not present

## 2021-10-04 DIAGNOSIS — N319 Neuromuscular dysfunction of bladder, unspecified: Secondary | ICD-10-CM | POA: Diagnosis not present

## 2021-10-04 DIAGNOSIS — G894 Chronic pain syndrome: Secondary | ICD-10-CM | POA: Diagnosis not present

## 2021-10-04 DIAGNOSIS — I2510 Atherosclerotic heart disease of native coronary artery without angina pectoris: Secondary | ICD-10-CM | POA: Diagnosis not present

## 2021-10-04 DIAGNOSIS — Z7952 Long term (current) use of systemic steroids: Secondary | ICD-10-CM | POA: Diagnosis not present

## 2021-10-04 DIAGNOSIS — E1151 Type 2 diabetes mellitus with diabetic peripheral angiopathy without gangrene: Secondary | ICD-10-CM | POA: Diagnosis not present

## 2021-10-04 DIAGNOSIS — K760 Fatty (change of) liver, not elsewhere classified: Secondary | ICD-10-CM | POA: Diagnosis not present

## 2021-10-04 DIAGNOSIS — M545 Low back pain, unspecified: Secondary | ICD-10-CM | POA: Diagnosis not present

## 2021-10-04 DIAGNOSIS — E7849 Other hyperlipidemia: Secondary | ICD-10-CM | POA: Diagnosis not present

## 2021-10-04 DIAGNOSIS — N529 Male erectile dysfunction, unspecified: Secondary | ICD-10-CM | POA: Diagnosis not present

## 2021-10-04 DIAGNOSIS — R3915 Urgency of urination: Secondary | ICD-10-CM | POA: Diagnosis not present

## 2021-10-04 DIAGNOSIS — K219 Gastro-esophageal reflux disease without esophagitis: Secondary | ICD-10-CM | POA: Diagnosis not present

## 2021-10-04 DIAGNOSIS — J449 Chronic obstructive pulmonary disease, unspecified: Secondary | ICD-10-CM | POA: Diagnosis not present

## 2021-10-04 DIAGNOSIS — Z7984 Long term (current) use of oral hypoglycemic drugs: Secondary | ICD-10-CM | POA: Diagnosis not present

## 2021-10-04 DIAGNOSIS — F319 Bipolar disorder, unspecified: Secondary | ICD-10-CM | POA: Diagnosis not present

## 2021-10-04 DIAGNOSIS — M1990 Unspecified osteoarthritis, unspecified site: Secondary | ICD-10-CM | POA: Diagnosis not present

## 2021-10-07 ENCOUNTER — Inpatient Hospital Stay: Payer: PPO | Admitting: Infectious Diseases

## 2021-10-08 ENCOUNTER — Telehealth: Payer: Self-pay | Admitting: *Deleted

## 2021-10-08 DIAGNOSIS — T8454XA Infection and inflammatory reaction due to internal left knee prosthesis, initial encounter: Secondary | ICD-10-CM | POA: Diagnosis not present

## 2021-10-08 DIAGNOSIS — E7849 Other hyperlipidemia: Secondary | ICD-10-CM | POA: Diagnosis not present

## 2021-10-08 DIAGNOSIS — M545 Low back pain, unspecified: Secondary | ICD-10-CM | POA: Diagnosis not present

## 2021-10-08 DIAGNOSIS — R3915 Urgency of urination: Secondary | ICD-10-CM | POA: Diagnosis not present

## 2021-10-08 DIAGNOSIS — E1151 Type 2 diabetes mellitus with diabetic peripheral angiopathy without gangrene: Secondary | ICD-10-CM | POA: Diagnosis not present

## 2021-10-08 DIAGNOSIS — F319 Bipolar disorder, unspecified: Secondary | ICD-10-CM | POA: Diagnosis not present

## 2021-10-08 DIAGNOSIS — J449 Chronic obstructive pulmonary disease, unspecified: Secondary | ICD-10-CM | POA: Diagnosis not present

## 2021-10-08 DIAGNOSIS — K219 Gastro-esophageal reflux disease without esophagitis: Secondary | ICD-10-CM | POA: Diagnosis not present

## 2021-10-08 DIAGNOSIS — G894 Chronic pain syndrome: Secondary | ICD-10-CM | POA: Diagnosis not present

## 2021-10-08 DIAGNOSIS — G4709 Other insomnia: Secondary | ICD-10-CM | POA: Diagnosis not present

## 2021-10-08 DIAGNOSIS — N319 Neuromuscular dysfunction of bladder, unspecified: Secondary | ICD-10-CM | POA: Diagnosis not present

## 2021-10-08 DIAGNOSIS — Z7984 Long term (current) use of oral hypoglycemic drugs: Secondary | ICD-10-CM | POA: Diagnosis not present

## 2021-10-08 DIAGNOSIS — Z792 Long term (current) use of antibiotics: Secondary | ICD-10-CM | POA: Diagnosis not present

## 2021-10-08 DIAGNOSIS — N529 Male erectile dysfunction, unspecified: Secondary | ICD-10-CM | POA: Diagnosis not present

## 2021-10-08 DIAGNOSIS — M5136 Other intervertebral disc degeneration, lumbar region: Secondary | ICD-10-CM | POA: Diagnosis not present

## 2021-10-08 DIAGNOSIS — M1990 Unspecified osteoarthritis, unspecified site: Secondary | ICD-10-CM | POA: Diagnosis not present

## 2021-10-08 DIAGNOSIS — M5030 Other cervical disc degeneration, unspecified cervical region: Secondary | ICD-10-CM | POA: Diagnosis not present

## 2021-10-08 DIAGNOSIS — K760 Fatty (change of) liver, not elsewhere classified: Secondary | ICD-10-CM | POA: Diagnosis not present

## 2021-10-08 DIAGNOSIS — E1169 Type 2 diabetes mellitus with other specified complication: Secondary | ICD-10-CM | POA: Diagnosis not present

## 2021-10-08 DIAGNOSIS — I2510 Atherosclerotic heart disease of native coronary artery without angina pectoris: Secondary | ICD-10-CM | POA: Diagnosis not present

## 2021-10-08 DIAGNOSIS — F411 Generalized anxiety disorder: Secondary | ICD-10-CM | POA: Diagnosis not present

## 2021-10-08 DIAGNOSIS — E1142 Type 2 diabetes mellitus with diabetic polyneuropathy: Secondary | ICD-10-CM | POA: Diagnosis not present

## 2021-10-08 DIAGNOSIS — Z7952 Long term (current) use of systemic steroids: Secondary | ICD-10-CM | POA: Diagnosis not present

## 2021-10-08 DIAGNOSIS — F4310 Post-traumatic stress disorder, unspecified: Secondary | ICD-10-CM | POA: Diagnosis not present

## 2021-10-08 NOTE — Telephone Encounter (Signed)
Received from [employer] PAP:  Ozempic '2mg'$ /64m 1 pen x 5 boxes.  Lot [id number]: 07/10/2023.  [name] by telephone that medication is ready to be picked up at our office.

## 2021-10-09 DIAGNOSIS — M87059 Idiopathic aseptic necrosis of unspecified femur: Secondary | ICD-10-CM | POA: Diagnosis not present

## 2021-10-11 ENCOUNTER — Ambulatory Visit
Admission: RE | Admit: 2021-10-11 | Discharge: 2021-10-11 | Disposition: A | Discharge status: Home / Self Care | Payer: PPO | Source: Ambulatory Visit | Attending: Infectious Diseases | Admitting: Infectious Diseases

## 2021-10-11 VITALS — BP 140/82 | HR 108 | Temp 97.7°F | Resp 17 | Ht 70.0 in | Wt 180.0 lb

## 2021-10-11 DIAGNOSIS — T8454XA Infection and inflammatory reaction due to internal left knee prosthesis, initial encounter: Secondary | ICD-10-CM | POA: Diagnosis not present

## 2021-10-11 DIAGNOSIS — Z96652 Presence of left artificial knee joint: Secondary | ICD-10-CM | POA: Diagnosis not present

## 2021-10-11 DIAGNOSIS — Y792 Prosthetic and other implants, materials and accessory orthopedic devices associated with adverse incidents: Secondary | ICD-10-CM | POA: Diagnosis not present

## 2021-10-11 DIAGNOSIS — E11621 Type 2 diabetes mellitus with foot ulcer: Secondary | ICD-10-CM

## 2021-10-11 LAB — COMPREHENSIVE METABOLIC PANEL
ALT: 6 U/L (ref 0–44)
AST: 19 U/L (ref 15–41)
Albumin: 3.2 g/dL — ABNORMAL LOW (ref 3.5–5.0)
Alkaline Phosphatase: 71 U/L (ref 38–126)
Anion gap: 8 (ref 5–15)
BUN: 24 mg/dL — ABNORMAL HIGH (ref 8–23)
CO2: 28 mmol/L (ref 22–32)
Calcium: 9 mg/dL (ref 8.9–10.3)
Chloride: 103 mmol/L (ref 98–111)
Creatinine, Ser: 1.09 mg/dL (ref 0.61–1.24)
GFR, Estimated: >60 mL/min (ref >60)
Glucose, Bld: 185 mg/dL — ABNORMAL HIGH (ref 70–99)
Potassium: 4.3 mmol/L (ref 3.5–5.1)
Sodium: 139 mmol/L (ref 135–145)
Total Bilirubin: 0.4 mg/dL (ref 0.3–1.2)
Total Protein: 8.7 g/dL — ABNORMAL HIGH (ref 6.5–8.1)

## 2021-10-11 LAB — CBC WITH DIFFERENTIAL/PLATELET
Abs Immature Granulocytes: 0.02 10*3/uL (ref 0.00–0.07)
Basophils Absolute: 0 10*3/uL (ref 0.0–0.1)
Basophils Relative: 1 %
Eosinophils Absolute: 0.2 10*3/uL (ref 0.0–0.5)
Eosinophils Relative: 3 %
HCT: 37.3 % — ABNORMAL LOW (ref 39.0–52.0)
Hemoglobin: 11.6 g/dL — ABNORMAL LOW (ref 13.0–17.0)
Immature Granulocytes: 0 %
Lymphocytes Relative: 28 %
Lymphs Abs: 1.6 10*3/uL (ref 0.7–4.0)
MCH: 26.4 pg (ref 26.0–34.0)
MCHC: 31.1 g/dL (ref 30.0–36.0)
MCV: 84.8 fL (ref 80.0–100.0)
Monocytes Absolute: 0.4 10*3/uL (ref 0.1–1.0)
Monocytes Relative: 7 %
Neutro Abs: 3.4 10*3/uL (ref 1.7–7.7)
Neutrophils Relative %: 61 %
Platelets: 212 10*3/uL (ref 150–400)
RBC: 4.4 MIL/uL (ref 4.22–5.81)
RDW: 18.6 % — ABNORMAL HIGH (ref 11.5–15.5)
WBC: 5.6 10*3/uL (ref 4.0–10.5)
nRBC: 0 % (ref 0.0–0.2)

## 2021-10-11 LAB — SEDIMENTATION RATE: Sed Rate: 82 mm/hr — ABNORMAL HIGH (ref 0–20)

## 2021-10-11 LAB — C-REACTIVE PROTEIN: CRP: 3 mg/dL — ABNORMAL HIGH (ref <1.0)

## 2021-10-11 MED ORDER — HEPARIN SOD (PORK) LOCK FLUSH 100 UNIT/ML IV SOLN
INTRAVENOUS | Status: DC
Start: 2021-10-11 — End: 2021-10-11
  Filled 2021-10-11: qty 5

## 2021-10-11 MED ORDER — SODIUM CHLORIDE FLUSH 0.9 % IV SOLN
INTRAVENOUS | Status: AC
  Filled 2021-10-11: qty 10

## 2021-10-12 DIAGNOSIS — Z96652 Presence of left artificial knee joint: Secondary | ICD-10-CM | POA: Diagnosis not present

## 2021-10-14 DIAGNOSIS — Z96652 Presence of left artificial knee joint: Secondary | ICD-10-CM | POA: Diagnosis not present

## 2021-10-15 ENCOUNTER — Encounter: Payer: Self-pay | Admitting: Family Medicine

## 2021-10-15 ENCOUNTER — Ambulatory Visit (INDEPENDENT_AMBULATORY_CARE_PROVIDER_SITE_OTHER): Payer: PPO | Admitting: Family Medicine

## 2021-10-15 VITALS — BP 140/70 | HR 111 | Temp 98.5°F | Ht 68.75 in | Wt 187.4 lb

## 2021-10-15 DIAGNOSIS — F5104 Psychophysiologic insomnia: Secondary | ICD-10-CM | POA: Diagnosis not present

## 2021-10-15 DIAGNOSIS — M009 Pyogenic arthritis, unspecified: Secondary | ICD-10-CM | POA: Diagnosis not present

## 2021-10-15 DIAGNOSIS — E1142 Type 2 diabetes mellitus with diabetic polyneuropathy: Secondary | ICD-10-CM | POA: Diagnosis not present

## 2021-10-15 LAB — POCT GLYCOSYLATED HEMOGLOBIN (HGB A1C): Hemoglobin A1C: 6.5 % — AB (ref 4.0–5.6)

## 2021-10-15 NOTE — Progress Notes (Signed)
Patient ID: Erik Martinez, male    DOB: 1955-07-02, 66 y.o.   MRN: [id number]  This visit was conducted in person.  BP (!) 140/70   Pulse (!) 111   Temp 98.5 F (36.9 C) (Oral)   Ht 5' 8.75" (1.746 m)   Wt 187 lb 6 oz (85 kg)   SpO2 97%   BMI 27.87 kg/m    CC:  Chief Complaint  Patient presents with   Diabetes   Peripheral Neuropathy    Subjective:   HPI: Erik Martinez is a 66 y.o. male presenting on 10/15/2021 for Diabetes and Peripheral Neuropathy   Recent TKR on  07/2021, had joint infection... revision 10/12/2021  Started rehab.  HAs PICC line for antibiotic.. has 8 more days.  Diabetes: Well-controlled on metformin 1000 mg twice daily, ozempic and jardiance Lab Results  Component Value Date   HGBA1C 6.5 (A) 10/15/2021  Using medications without difficulties: Hypoglycemic episodes: none Hyperglycemic episodes:none Feet problems: yes neuropathy Blood Sugars averaging: eye exam within last year: FBS 97-180   Wt Readings from Last 3 Encounters:  10/15/21 187 lb 6 oz (85 kg)  09/10/21 198 lb (89.8 kg)  08/06/21 192 lb (87.1 kg)     Associated with peripheral neuropathy.. he state symptoms are severe bilaterally, feet are burning.  On lyrica.Marland Kitchen works best as 200 mg three times a day. Recent concern for pt getting multiple prescription from multiple prescribers.    He states he did take extra lyrica from the [place].  Using muscle relaxant and ambien to help sleep.  SE amitryptiline, caused grogginess.  Felt felt  sedation/flat affect with Cymbalta in past.   Seen by neurology in past.   Potasssium low on recent labs.  Relevant past medical, surgical, family and social history reviewed and updated as indicated. Interim medical history since our last visit reviewed. Allergies and medications reviewed and updated. Outpatient Medications Prior to Visit  Medication Sig Dispense Refill   acetaminophen (TYLENOL) 500 MG tablet Take 1,500 mg by mouth 2 (two) times  daily as needed for moderate pain or mild pain.     atorvastatin (LIPITOR) 20 MG tablet Take 20 mg by mouth at bedtime.     ceFAZolin (ANCEF) IVPB Inject 6 g into the vein continuous. cefazolin 6gm daily as continuous infusion over 24hr or may give cefazolin 2gm IV q8h  Indication:  MSSA prosthetic knee infection First Dose: Yes Last Day of Therapy:  10/22/2021 Labs - Once weekly:  CBC/D, CMP, ESR and CRP Please pull PIC at completion of IV antibiotics Fax weekly lab results  promptly to [phone] Method of administration: IV Push (or elastomeric if continuous infusion) Method of administration may be changed at the discretion of home infusion pharmacist based upon assessment of the patient and/or caregiver's ability to self-administer the medication ordered. 35 Units 0   cyclobenzaprine (FLEXERIL) 10 MG tablet SMARTSIG:1 Tablet(s) By Mouth Every 12 Hours     diclofenac (VOLTAREN) 75 MG EC tablet Take 75 mg by mouth 2 (two) times daily.     empagliflozin (JARDIANCE) 25 MG TABS tablet Take 1 tablet (25 mg total) by mouth daily. 90 tablet 1   famotidine (PEPCID) 20 MG tablet Take 1 tablet (20 mg total) by mouth 2 (two) times daily. 15 tablet 0   ferrous sulfate 324 MG TBEC TAKE ONE TABLET BY MOUTH MONDAY,WEDNESDAY,FRIDAY     lidocaine (LIDODERM) 5 % Place 1 patch onto the skin daily. Remove & Discard patch  within 12 hours or as directed by MD 30 patch 0   metFORMIN (GLUCOPHAGE) 1000 MG tablet Take 500 mg by mouth 2 (two) times daily.     Multiple Vitamin (MULTIVITAMIN WITH MINERALS) TABS tablet Take 1 tablet by mouth in the morning.     oxyCODONE (ROXICODONE) 15 MG immediate release tablet Take 15 mg by mouth every 6 (six) hours.     OZEMPIC, 0.25 OR 0.5 MG/DOSE, 2 MG/3ML SOPN Inject 0.5 mg into the skin once a week.     pantoprazole (PROTONIX) 40 MG tablet Take 1 tablet (40 mg total) by mouth daily. 90 tablet 3   pregabalin (LYRICA) 300 MG capsule Take 1 capsule (300 mg total) by mouth 2  (two) times daily. 60 capsule 2   rifampin (RIFADIN) 300 MG capsule Take 1 capsule (300 mg total) by mouth 2 (two) times daily. 70 capsule 0   terbinafine (LAMISIL) 250 MG tablet Take 1 tablet (250 mg total) by mouth daily. 90 tablet 0   zolpidem (AMBIEN) 10 MG tablet Take 1 tablet (10 mg total) by mouth at bedtime as needed. for sleep 30 tablet 2   celecoxib (CELEBREX) 200 MG capsule Take by mouth.     enoxaparin (LOVENOX) 40 MG/0.4ML injection Inject 0.4 mLs (40 mg total) into the skin daily for 14 days. 5.6 mL 0   No facility-administered medications prior to visit.     Per HPI unless specifically indicated in ROS section below Review of Systems Objective:  BP (!) 140/70   Pulse (!) 111   Temp 98.5 F (36.9 C) (Oral)   Ht 5' 8.75" (1.746 m)   Wt 187 lb 6 oz (85 kg)   SpO2 97%   BMI 27.87 kg/m   Wt Readings from Last 3 Encounters:  10/15/21 187 lb 6 oz (85 kg)  09/10/21 198 lb (89.8 kg)  08/06/21 192 lb (87.1 kg)      Physical Exam    Results for orders placed or performed in visit on 10/15/21  POCT glycosylated hemoglobin (Hb A1C)  Result Value Ref Range   Hemoglobin A1C 6.5 (A) 4.0 - 5.6 %   HbA1c POC (<> result, manual entry)     HbA1c, POC (prediabetic range)     HbA1c, POC (controlled diabetic range)       COVID 19 screen:  No recent travel or known exposure to COVID19 The patient denies respiratory symptoms of COVID 19 at this time. The importance of social distancing was discussed today.   Assessment and Plan Problem List Items Addressed This Visit     Type 2 diabetes mellitus with diabetic polyneuropathy, without long-term current use of insulin (HCC) - Primary (Chronic)    Well-controlled on metformin 1000 mg twice daily, ozempic and jardiance      Relevant Orders   POCT glycosylated hemoglobin (Hb A1C) (Completed)   Chronic insomnia    Using muscle relaxant and ambien to hekp sleep.  SE amitryptiline, caused grogginess.  Felt felt  sedation/flat  affect with Cymbalta in past.      Diabetic peripheral neuropathy ([place])     On lyrica.Marland Kitchen works best as 200 mg three times a day. Recent concern for pt getting multiple prescription from multiple prescribers.    He states he did take extra lyrica from the [place] prescriptions in.  He states he will no longer.  He understands he needs to get this from provide.  We may be agreement that he will only get it from me.  Follow PDMP very closely. PDMP reviewed during this encounter.         Joint infection ([place])    Recent TKR on  07/2021, had joint infection... revision 10/12/2021  Started rehab.  HAs PICC line for antibiotic.. has 8 more days.  He is currently receiving oxycodone from his orthopedic doctor given recent surgery by joint infection and revision.          Eliezer Lofts, MD

## 2021-10-16 DIAGNOSIS — M87059 Idiopathic aseptic necrosis of unspecified femur: Secondary | ICD-10-CM | POA: Diagnosis not present

## 2021-10-18 ENCOUNTER — Telehealth (INDEPENDENT_AMBULATORY_CARE_PROVIDER_SITE_OTHER): Payer: Self-pay | Admitting: Vascular Surgery

## 2021-10-18 ENCOUNTER — Ambulatory Visit
Admission: RE | Admit: 2021-10-18 | Discharge: 2021-10-18 | Disposition: A | Discharge status: Home / Self Care | Payer: PPO | Source: Ambulatory Visit | Attending: Infectious Diseases | Admitting: Infectious Diseases

## 2021-10-18 VITALS — BP 142/80 | HR 104 | Temp 99.4°F | Resp 18 | Ht 70.0 in | Wt 187.0 lb

## 2021-10-18 DIAGNOSIS — Z01818 Encounter for other preprocedural examination: Secondary | ICD-10-CM | POA: Diagnosis not present

## 2021-10-18 DIAGNOSIS — Z01812 Encounter for preprocedural laboratory examination: Secondary | ICD-10-CM | POA: Diagnosis present

## 2021-10-18 LAB — CBC WITH DIFFERENTIAL/PLATELET
Abs Immature Granulocytes: 0.02 10*3/uL (ref 0.00–0.07)
Basophils Absolute: 0 10*3/uL (ref 0.0–0.1)
Basophils Relative: 1 %
Eosinophils Absolute: 0.2 10*3/uL (ref 0.0–0.5)
Eosinophils Relative: 3 %
HCT: 36.8 % — ABNORMAL LOW (ref 39.0–52.0)
Hemoglobin: 11.7 g/dL — ABNORMAL LOW (ref 13.0–17.0)
Immature Granulocytes: 0 %
Lymphocytes Relative: 25 %
Lymphs Abs: 1.6 10*3/uL (ref 0.7–4.0)
MCH: 27 pg (ref 26.0–34.0)
MCHC: 31.8 g/dL (ref 30.0–36.0)
MCV: 84.8 fL (ref 80.0–100.0)
Monocytes Absolute: 0.5 10*3/uL (ref 0.1–1.0)
Monocytes Relative: 8 %
Neutro Abs: 4.1 10*3/uL (ref 1.7–7.7)
Neutrophils Relative %: 63 %
Platelets: 224 10*3/uL (ref 150–400)
RBC: 4.34 MIL/uL (ref 4.22–5.81)
RDW: 18.1 % — ABNORMAL HIGH (ref 11.5–15.5)
WBC: 6.5 10*3/uL (ref 4.0–10.5)
nRBC: 0 % (ref 0.0–0.2)

## 2021-10-18 LAB — COMPREHENSIVE METABOLIC PANEL
ALT: <5 U/L (ref 0–44)
AST: 18 U/L (ref 15–41)
Albumin: 3.3 g/dL — ABNORMAL LOW (ref 3.5–5.0)
Alkaline Phosphatase: 70 U/L (ref 38–126)
Anion gap: 10 (ref 5–15)
BUN: 23 mg/dL (ref 8–23)
CO2: 23 mmol/L (ref 22–32)
Calcium: 9 mg/dL (ref 8.9–10.3)
Chloride: 103 mmol/L (ref 98–111)
Creatinine, Ser: 1.1 mg/dL (ref 0.61–1.24)
GFR, Estimated: >60 mL/min (ref >60)
Glucose, Bld: 99 mg/dL (ref 70–99)
Potassium: 3.9 mmol/L (ref 3.5–5.1)
Sodium: 136 mmol/L (ref 135–145)
Total Bilirubin: 0.3 mg/dL (ref 0.3–1.2)
Total Protein: 8.3 g/dL — ABNORMAL HIGH (ref 6.5–8.1)

## 2021-10-18 LAB — SEDIMENTATION RATE: Sed Rate: 68 mm/hr — ABNORMAL HIGH (ref 0–20)

## 2021-10-18 LAB — C-REACTIVE PROTEIN: CRP: 4.4 mg/dL — ABNORMAL HIGH (ref <1.0)

## 2021-10-18 MED ORDER — HEPARIN SOD (PORK) LOCK FLUSH 100 UNIT/ML IV SOLN
INTRAVENOUS | Status: AC
  Filled 2021-10-18: qty 5

## 2021-10-18 MED ORDER — HEPARIN SOD (PORK) LOCK FLUSH 100 UNIT/ML IV SOLN: 250.0000 [IU] | INTRAVENOUS | Status: DC | PRN

## 2021-10-18 NOTE — Telephone Encounter (Signed)
Patient had a stint placed behind knee and he had a knee replacement on the same knee on 07/19/21.  He states his knee got infected and he had to go back on 09/10/21 to have it cleaned out from the infection and he has been on 24 hours IV for the last 6 weeks that he has to change once a day  He would like to come in for appointment because his knee does not feel right.  Please advise.

## 2021-10-19 ENCOUNTER — Encounter: Payer: Self-pay | Admitting: Infectious Diseases

## 2021-10-19 ENCOUNTER — Ambulatory Visit: Payer: PPO | Attending: Infectious Diseases | Admitting: Infectious Diseases

## 2021-10-19 VITALS — BP 144/80 | HR 100 | Temp 98.1°F | Resp 16 | Ht 70.0 in | Wt 187.0 lb

## 2021-10-19 DIAGNOSIS — Z7984 Long term (current) use of oral hypoglycemic drugs: Secondary | ICD-10-CM | POA: Insufficient documentation

## 2021-10-19 DIAGNOSIS — Z96652 Presence of left artificial knee joint: Secondary | ICD-10-CM | POA: Insufficient documentation

## 2021-10-19 DIAGNOSIS — T8450XD Infection and inflammatory reaction due to unspecified internal joint prosthesis, subsequent encounter: Secondary | ICD-10-CM

## 2021-10-19 DIAGNOSIS — T8454XD Infection and inflammatory reaction due to internal left knee prosthesis, subsequent encounter: Secondary | ICD-10-CM

## 2021-10-19 DIAGNOSIS — E785 Hyperlipidemia, unspecified: Secondary | ICD-10-CM | POA: Diagnosis not present

## 2021-10-19 DIAGNOSIS — M25562 Pain in left knee: Secondary | ICD-10-CM | POA: Diagnosis not present

## 2021-10-19 DIAGNOSIS — I10 Essential (primary) hypertension: Secondary | ICD-10-CM | POA: Insufficient documentation

## 2021-10-19 DIAGNOSIS — B9561 Methicillin susceptible Staphylococcus aureus infection as the cause of diseases classified elsewhere: Secondary | ICD-10-CM | POA: Insufficient documentation

## 2021-10-19 DIAGNOSIS — E119 Type 2 diabetes mellitus without complications: Secondary | ICD-10-CM | POA: Diagnosis not present

## 2021-10-19 DIAGNOSIS — T8454XS Infection and inflammatory reaction due to internal left knee prosthesis, sequela: Secondary | ICD-10-CM | POA: Insufficient documentation

## 2021-10-19 DIAGNOSIS — Z792 Long term (current) use of antibiotics: Secondary | ICD-10-CM | POA: Diagnosis not present

## 2021-10-19 DIAGNOSIS — Z79899 Other long term (current) drug therapy: Secondary | ICD-10-CM | POA: Insufficient documentation

## 2021-10-19 DIAGNOSIS — Y838 Other surgical procedures as the cause of abnormal reaction of the patient, or of later complication, without mention of misadventure at the time of the procedure: Secondary | ICD-10-CM | POA: Diagnosis not present

## 2021-10-19 MED ORDER — RIFAMPIN 300 MG PO CAPS: 300.0000 mg | ORAL_CAPSULE | Freq: Two times a day (BID) | ORAL | 4 refills | Status: DC

## 2021-10-19 MED ORDER — CEFADROXIL 1 G PO TABS: 1.0000 g | ORAL_TABLET | Freq: Two times a day (BID) | ORAL | 4 refills | Status: DC

## 2021-10-19 NOTE — Patient Instructions (Signed)
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 Will do labs in 1 month, follow up 4 months

## 2021-10-19 NOTE — Progress Notes (Signed)
NAME: Erik Martinez  DOB: 10-28-1955  MRN: [id number]  Date/Time: 10/19/2021 10:39 AM   Subjective:  Follow up after recent hospitalization for left Prosthetic joint infection with MSSA He underwent washout and exchange of the femoral component and liner- HE was sent on Iv cefazolin for a total of [redacted] weeks along with rifampin 357m PO BID because of reatined hardware HE will be finishing 6 weeks on 10/22/21 and will go on Po antibiotics He is doing much better Pain and swelling much improved Getting PT twice a week ? Erik Martinez a 66y.o. 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 He had some swelling and went to see ortho on 09/08/21- asked to take NSAID and labs ordered. On 9/1 patient called ortho as he had severe drainage of yellow, purulent stuff from the surgical site. A culture was taken and he was asked to get admitted On 09/11/21 he underwent I/D of left knee with polyethylene exchange and placement of antibiotic beads Culture positive for MSSA I Past Medical History:  Diagnosis Date   Anxiety    Arthritis    Avascular necrosis of bones of both hips (HCC)    Bipolar disorder (HCC)    Bladder spasms    Chronic pain of left knee    Chronic pain syndrome    a.) followed by pain clinic   Cocaine use disorder, moderate, dependence ([place] 09/09/2015   DDD (degenerative disc disease)    Depression    Erectile dysfunction    a.) on PDE5i (sildenafil)   Frequency-urgency syndrome    GERD (gastroesophageal reflux disease)    History of hepatitis B    1983  TX'D--  NO ISSUES OR SYMPTOMS SINCE   Hypotestosteronemia    Narcotic  psychosis ([facility] 09/08/2015   Nocturia    Polysubstance abuse (HCC)    a.) cocaine + marijuana + BZO + opioids   PONV (postoperative nausea and vomiting)    "difficult to put me to sleep"   Spinal headache    T2DM (type 2 diabetes mellitus) ([facility]    Urine incontinence     Past Surgical History:  Procedure Laterality Date   APPLICATION OF WOUND VAC Left 09/11/2021   Procedure: APPLICATION OF WOUND VAC;  Surgeon: HDereck Leep MD;  Location: [facility];  Service: Orthopedics;  Laterality: Left;  [id number]   BACK SURGERY     2 rods and 4 screws artificial disc   INTERSTIM IMPLANT PLACEMENT  2007   INTERSTIM IMPLANT REVISION N/A 08/17/2012   Procedure: REPLACMENT OF IPG PLUS REPLACE LEAD OF INTERSTIM IMPLANT ;  Surgeon: SReece Packer MD;  [place]  Service: Urology;  Laterality: N/A;   IRRIGATION AND DEBRIDEMENT KNEE Left 09/11/2021   Procedure: IRRIGATION AND DEBRIDEMENT WITH POLY EXCHANGE LEFT KNEE;  Surgeon: HDereck Leep MD;  Location: [facility];  Service: Orthopedics;  Laterality: Left;   JOINT REPLACEMENT     Lt knee and Lt hip   LOWER EXTREMITY ANGIOGRAPHY Left 11/17/2020   Procedure: LOWER EXTREMITY ANGIOGRAPHY;  Surgeon: SKatha Cabal MD;  [facility];  Service: Cardiovascular;  Laterality: Left;   LOWER EXTREMITY ANGIOGRAPHY Right 12/22/2020   Procedure: LOWER EXTREMITY ANGIOGRAPHY;  Surgeon: Katha Cabal, MD;  Location: [place] [facility];  Service: Cardiovascular;  Laterality: Right;   LUMBAR DISC SURGERY  2005   L5   LUMBAR FUSION  X2  2006  &  2007   OPEN DEBRIDEMENT LEFT TOTAL KNEE (SCAR, BONEY GRAOWTH)/ REMOVAL OLD SUTURES  05-01-2007   PARTIAL HIP ARTHROPLASTY Left 2001   x2   SHOULDER OPEN ROTATOR CUFF REPAIR Left 2000   TOTAL HIP ARTHROPLASTY Left 2002   TOTAL KNEE ARTHROPLASTY Left    PARTIAL LEFT KNEE REPLACEMENT PRIOR TO THIS   TOTAL KNEE REVISION Left 2008   TOTAL KNEE REVISION Left 07/19/2021    Procedure: TOTAL KNEE REVISION;  Surgeon: [name], MD;  Location: [facility];  Service: Orthopedics;  Laterality: Left;   TRIGGER FINGER RELEASE Bilateral    SEVERAL FINGERS   TRIGGER FINGER RELEASE Right 06/18/2014   Procedure: RELEASE TRIGGER FINGER/A-1 PULLEY;  Surgeon: [name], MD;  Location: [facility];  Service: Orthopedics;  Laterality: Right;    Social History   Socioeconomic History   Marital status: Divorced    Spouse name: Not on file   Number of children: 3   Years of education: Not on file   Highest education level: Some college, no degree  Occupational History   Not on file  Tobacco Use   Smoking status: Never   Smokeless tobacco: Never  Vaping Use   Vaping Use: Never used  Substance and Sexual Activity   Alcohol use: Not Currently    Alcohol/week: 0.0 - 2.0 standard drinks of alcohol   Drug use: Not Currently    Types: Cocaine    Comment: pt states its been years since he used cocaine 6 yrs ago   Sexual activity: Not Currently  Other Topics Concern   Not on file  Social History Narrative    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, partime   Social Determinants of Health   Financial Resource Strain: [place]  (06/08/2021)   Overall Financial Resource Strain (CARDIA)    Difficulty of Paying Living Expenses: Not very hard  Food Insecurity: No Food Insecurity (06/08/2021)   Hunger Vital Sign    Worried About Running Out of Food in the Last Year: Never true    Ran Out of Food in the Last Year: Never true  Transportation Needs: Not on file  Physical Activity: Not on file  Stress: Not on file  Social Connections: Not on file  Intimate Partner Violence: Not on file    Family History  Problem Relation Age of Onset   Diabetes Mother    Arthritis Mother    Cancer Mother        lung cancer   Hyperlipidemia Mother    Stroke Mother    Alcohol abuse Father    Cancer Father        lung   Arthritis Father    Diabetes Brother    Allergies  Allergen Reactions   Suboxone  [Buprenorphine Hcl-Naloxone Hcl] Nausea And Vomiting   Tape Other (See Comments)    [place] *Paper Tape is ok    Codeine Rash, Itching and Nausea Only   Morphine And Related Itching and Rash   Other Rash    Telemetry electrodes   Silicone Rash    [place] -*Paper Tape is ok    I? Current Outpatient Medications  Medication Sig  Dispense Refill   acetaminophen (TYLENOL) 500 MG tablet Take 1,500 mg by mouth 2 (two) times daily as needed for moderate pain or mild pain.     atorvastatin (LIPITOR) 20 MG tablet Take 20 mg by mouth at bedtime.     ceFAZolin (ANCEF) IVPB Inject 6 g into the vein continuous. cefazolin 6gm daily as continuous infusion over 24hr or may give cefazolin 2gm IV q8h  Indication:  MSSA prosthetic knee infection First Dose: Yes Last Day of Therapy:  10/22/2021 Labs - Once weekly:  CBC/D, CMP, ESR and CRP Please pull PIC at completion of IV antibiotics Fax weekly lab results  promptly to [phone] Method of administration: IV Push (or elastomeric if continuous infusion) Method of administration may be changed at the discretion of home infusion pharmacist based upon assessment of the patient and/or caregiver's ability to self-administer the medication ordered. 35 Units 0   cyclobenzaprine (FLEXERIL) 10 MG tablet SMARTSIG:1 Tablet(s) By Mouth Every 12 Hours     diclofenac (VOLTAREN) 75 MG EC tablet Take 75 mg by mouth 2 (two) times daily.     empagliflozin (JARDIANCE) 25 MG TABS tablet Take 1 tablet (25 mg total) by mouth daily. 90 tablet 1   famotidine (PEPCID) 20 MG tablet Take 1 tablet (20 mg total) by mouth 2 (two) times daily. 15 tablet 0   ferrous sulfate 324 MG TBEC TAKE ONE TABLET BY MOUTH MONDAY,WEDNESDAY,FRIDAY     lidocaine (LIDODERM) 5 % Place 1 patch onto the skin daily. Remove & Discard patch within 12 hours or as directed by MD 30 patch 0   metFORMIN (GLUCOPHAGE) 1000 MG tablet Take 500 mg by mouth 2 (two) times daily.     Multiple Vitamin (MULTIVITAMIN  WITH MINERALS) TABS tablet Take 1 tablet by mouth in the morning.     oxyCODONE (ROXICODONE) 15 MG immediate release tablet Take 15 mg by mouth every 6 (six) hours.     OZEMPIC, 0.25 OR 0.5 MG/DOSE, 2 MG/3ML SOPN Inject 0.5 mg into the skin once a week.     pantoprazole (PROTONIX) 40 MG tablet Take 1 tablet (40 mg total) by mouth daily. 90 tablet 3   pregabalin (LYRICA) 300 MG capsule Take 1 capsule (300 mg total) by mouth 2 (two) times daily. 60 capsule 2   rifampin (RIFADIN) 300 MG capsule Take 1 capsule (300 mg total) by mouth 2 (two) times daily. 70 capsule 0   terbinafine (LAMISIL) 250 MG tablet Take 1 tablet (250 mg total) by mouth daily. 90 tablet 0   zolpidem (AMBIEN) 10 MG tablet Take 1 tablet (10 mg total) by mouth at bedtime as needed. for sleep 30 tablet 2   No current facility-administered medications for this visit.     Abtx:  Anti-infectives (From admission, onward)    None       REVIEW OF SYSTEMS:  Const: negative fever, negative chills, negative weight loss Eyes: negative diplopia or visual changes, negative eye pain ENT: negative coryza, negative sore throat Resp: negative cough, hemoptysis, dyspnea Cards: negative for chest pain, palpitations, lower extremity edema GU: negative for frequency, dysuria and hematuria GI: Negative for abdominal pain, diarrhea, bleeding, constipation Skin: negative for rash and pruritus Heme: negative for easy bruising and gum/nose bleeding MS: stiffness left knee Neurolo:negative for headaches, dizziness, vertigo, memory problems  Psych: negative for feelings of anxiety, depression  Endocrine: negative for thyroid, diabetes Allergy/Immunology- as above ? Objective:  VITALS:  BP (!) 144/80   Pulse 100  Temp 98.1 F (36.7 C)   Resp 16   Ht '5\' 10"'  (1.778 m)   Wt 187 lb (84.8 kg)   SpO2 97%   BMI 26.83 kg/m  LDA Foley Central line Other drainage tubes PHYSICAL EXAM:  General: Alert, cooperative, no distress, appears  stated age.  Head: Normocephalic, without obvious abnormality, atraumatic. Eyes: Conjunctivae clear, anicteric sclerae. Pupils are equal ENT Nares normal. No drainage or sinus tenderness. Lips, mucosa, and tongue normal. No Thrush Neck: Supple, symmetrical, no adenopathy, thyroid: non tender no carotid bruit and no JVD. Back: No CVA tenderness. Lungs: Clear to auscultation bilaterally. No Wheezing or Rhonchi. No rales. Heart: Regular rate and rhythm, no murmur, rub or gallop. Abdomen: Soft, non-tender,not distended. Bowel sounds normal. No masses Extremities: left knee surgical scar healed well Skin: No rashes or lesions. Or bruising Lymph: Cervical, supraclavicular normal. Neurologic: Grossly non-focal Pertinent Labs Lab Results CBC    Component Value Date/Time   WBC 6.5 10/18/2021 1349   RBC 4.34 10/18/2021 1349   HGB 11.7 (L) 10/18/2021 1349   HGB 13.4 05/23/2013 0329   HCT 36.8 (L) 10/18/2021 1349   HCT 38.5 (L) 05/23/2013 0329   PLT 224 10/18/2021 1349   PLT 153 05/23/2013 0329   MCV 84.8 10/18/2021 1349   MCV 86 05/23/2013 0329   MCH 27.0 10/18/2021 1349   MCHC 31.8 10/18/2021 1349   RDW 18.1 (H) 10/18/2021 1349   RDW 12.7 05/23/2013 0329   LYMPHSABS 1.6 10/18/2021 1349   LYMPHSABS 1.7 05/23/2013 0329   MONOABS 0.5 10/18/2021 1349   MONOABS 0.4 05/23/2013 0329   EOSABS 0.2 10/18/2021 1349   EOSABS 0.2 05/23/2013 0329   BASOSABS 0.0 10/18/2021 1349   BASOSABS 0.0 05/23/2013 0329       Latest Ref Rng & Units 10/18/2021    1:49 PM 10/11/2021    1:13 PM 09/16/2021    8:08 PM  CMP  Glucose 70 - 99 mg/dL 99  185    BUN 8 - 23 mg/dL 23  24    Creatinine 0.61 - 1.24 mg/dL 1.10  1.09    Sodium 135 - 145 mmol/L 136  139    Potassium 3.5 - 5.1 mmol/L 3.9  4.3  3.7   Chloride 98 - 111 mmol/L 103  103    CO2 22 - 32 mmol/L 23  28    Calcium 8.9 - 10.3 mg/dL 9.0  9.0    Total Protein 6.5 - 8.1 g/dL 8.3  8.7    Total Bilirubin 0.3 - 1.2 mg/dL 0.3  0.4    Alkaline Phos  38 - 126 U/L 70  71    AST 15 - 41 U/L 18  19    ALT 0 - 44 U/L <5  6     ? Impression/Recommendation ? ?Left knee PJI with MSSA- s/p I?D and excahnge of femoral componenet and liner On cefazolin IV and PO rifampin- will complete 6 weeks on 10/13-14 And will go on cefadroxil 1 gram PO BID + rifampin 371m Po BID for 4 more months If cefadroxil is too expensive then he will go on keflex 5061mPo Q6  Labs from 10/9 N lfts Cr 1,10 CRP 4.4 ESR 68 ( was > 140)   Treated HEPC- H/o substance use - clean for 5 years Will check UTOX     DM on metformin and ozempic   HTN on atenolol and clonidine   HLD on atorvastatin   ? ___________________________________________________ Discussed with patient, in  detail Follow up 4 months Note:  This document was prepared using Dragon voice recognition software and may include unintentional dictation errors.

## 2021-10-21 ENCOUNTER — Telehealth: Payer: Self-pay

## 2021-10-21 ENCOUNTER — Other Ambulatory Visit: Payer: Self-pay

## 2021-10-21 DIAGNOSIS — Z96652 Presence of left artificial knee joint: Secondary | ICD-10-CM | POA: Diagnosis not present

## 2021-10-21 DIAGNOSIS — M25562 Pain in left knee: Secondary | ICD-10-CM | POA: Diagnosis not present

## 2021-10-21 MED ORDER — CEPHALEXIN 500 MG PO CAPS: 500.0000 mg | ORAL_CAPSULE | Freq: Four times a day (QID) | ORAL | 4 refills | Status: DC

## 2021-10-21 NOTE — Telephone Encounter (Signed)
Thank you for the update Diminique!

## 2021-10-21 NOTE — Telephone Encounter (Signed)
Patient called requesting to have picc removed by [facility] day surgery before 10/25/21. Per Dr. Delaine Lame okay for patient to get last dose of IV antibiotic today and picc removed tomorrow at day surgery. I have communicated with Millissa with [facility] and they will contact the patient to get him scheduled with their office tomorrow to have picc removed. Patient has been updated of all information. Patient also advised that oral antibiotic treatment has been changed from cefadroxil to keflex due to cost. Patient verbalized understanding and will pick it up today. Kaydin Labo T Brooks Sailors

## 2021-10-22 ENCOUNTER — Ambulatory Visit
Admission: RE | Admit: 2021-10-22 | Discharge: 2021-10-22 | Disposition: A | Discharge status: Home / Self Care | Payer: PPO | Source: Ambulatory Visit | Attending: Infectious Diseases | Admitting: Infectious Diseases

## 2021-10-22 VITALS — BP 146/81 | HR 102 | Temp 98.3°F | Resp 16

## 2021-10-22 DIAGNOSIS — B999 Unspecified infectious disease: Secondary | ICD-10-CM | POA: Insufficient documentation

## 2021-10-22 LAB — COMPREHENSIVE METABOLIC PANEL
ALT: 8 U/L (ref 0–44)
AST: 19 U/L (ref 15–41)
Albumin: 3.4 g/dL — ABNORMAL LOW (ref 3.5–5.0)
Alkaline Phosphatase: 70 U/L (ref 38–126)
Anion gap: 9 (ref 5–15)
BUN: 23 mg/dL (ref 8–23)
CO2: 28 mmol/L (ref 22–32)
Calcium: 8.9 mg/dL (ref 8.9–10.3)
Chloride: 101 mmol/L (ref 98–111)
Creatinine, Ser: 1.07 mg/dL (ref 0.61–1.24)
GFR, Estimated: >60 mL/min (ref >60)
Glucose, Bld: 144 mg/dL — ABNORMAL HIGH (ref 70–99)
Potassium: 4.2 mmol/L (ref 3.5–5.1)
Sodium: 138 mmol/L (ref 135–145)
Total Bilirubin: 0.7 mg/dL (ref 0.3–1.2)
Total Protein: 8.4 g/dL — ABNORMAL HIGH (ref 6.5–8.1)

## 2021-10-22 LAB — CBC WITH DIFFERENTIAL/PLATELET
Abs Immature Granulocytes: 0.02 10*3/uL (ref 0.00–0.07)
Basophils Absolute: 0 10*3/uL (ref 0.0–0.1)
Basophils Relative: 1 %
Eosinophils Absolute: 0.2 10*3/uL (ref 0.0–0.5)
Eosinophils Relative: 3 %
HCT: 36.3 % — ABNORMAL LOW (ref 39.0–52.0)
Hemoglobin: 11.8 g/dL — ABNORMAL LOW (ref 13.0–17.0)
Immature Granulocytes: 0 %
Lymphocytes Relative: 23 %
Lymphs Abs: 1.3 10*3/uL (ref 0.7–4.0)
MCH: 27.3 pg (ref 26.0–34.0)
MCHC: 32.5 g/dL (ref 30.0–36.0)
MCV: 83.8 fL (ref 80.0–100.0)
Monocytes Absolute: 0.4 10*3/uL (ref 0.1–1.0)
Monocytes Relative: 7 %
Neutro Abs: 3.8 10*3/uL (ref 1.7–7.7)
Neutrophils Relative %: 66 %
Platelets: 197 10*3/uL (ref 150–400)
RBC: 4.33 MIL/uL (ref 4.22–5.81)
RDW: 17.3 % — ABNORMAL HIGH (ref 11.5–15.5)
WBC: 5.7 10*3/uL (ref 4.0–10.5)
nRBC: 0 % (ref 0.0–0.2)

## 2021-10-22 LAB — C-REACTIVE PROTEIN: CRP: 4.2 mg/dL — ABNORMAL HIGH (ref <1.0)

## 2021-10-22 LAB — SEDIMENTATION RATE: Sed Rate: 82 mm/hr — ABNORMAL HIGH (ref 0–20)

## 2021-10-22 MED ORDER — SODIUM CHLORIDE FLUSH 0.9 % IV SOLN
INTRAVENOUS | Status: AC
  Filled 2021-10-22: qty 20

## 2021-10-22 NOTE — Progress Notes (Signed)
Pt arrived via ambulation from home. PICC line in place, there was no cap in place on the PICC line upon arrival. Labs drawn from PICC line before PICC line removal, the PICC line measured at 38 cm. Pt tolerated removal of line well. Vaseline gauze applied to PICC line insertion area, tegaderm applied over gauze.

## 2021-10-25 ENCOUNTER — Other Ambulatory Visit: Payer: Self-pay | Admitting: Family Medicine

## 2021-10-25 DIAGNOSIS — G4700 Insomnia, unspecified: Secondary | ICD-10-CM

## 2021-10-25 NOTE — Telephone Encounter (Signed)
Spoke with Erik Martinez and told him to call his pharmacy because he should have available refills on file.  Last refilled 09/22/21 for #30 with 2 refills.  Patient states understanding.

## 2021-10-25 NOTE — Telephone Encounter (Signed)
Caller Name: Yuji Call back phone #: [phone]  MEDICATION(S):  zolpidem (AMBIEN) 10 MG tablet  Days of Med Remaining:   Has the patient contacted their pharmacy (YES/NO)? NO What did pharmacy advise?   Preferred Pharmacy:  [facility] [address],  ~~~Please advise patient/caregiver to allow 2-3 business days to process RX refills.   Pt is requesting could there be more than 1 refill?

## 2021-10-25 NOTE — Addendum Note (Signed)
Addended by: Carter Kitten on: 10/25/2021 03:07 PM   Modules accepted: Orders

## 2021-10-25 NOTE — Telephone Encounter (Signed)
Spoke with Pharmacist at [facility].  They state Rx was deactivated because our office had call them and said that the [place] would be handling Erik Martinez controlled substances so we would need to send a new refill if it is okay for them to refill.

## 2021-10-25 NOTE — Telephone Encounter (Signed)
Patient called and stated that he has no refills at the pharmacy and would like a call back.

## 2021-10-26 ENCOUNTER — Other Ambulatory Visit: Payer: Self-pay | Admitting: Family Medicine

## 2021-10-26 DIAGNOSIS — Z96652 Presence of left artificial knee joint: Secondary | ICD-10-CM | POA: Diagnosis not present

## 2021-10-26 MED ORDER — ZOLPIDEM TARTRATE 10 MG PO TABS: 10.0000 mg | ORAL_TABLET | Freq: Every evening | ORAL | 2 refills | Status: DC | PRN

## 2021-10-26 MED ORDER — PREGABALIN 300 MG PO CAPS: 300.0000 mg | ORAL_CAPSULE | Freq: Two times a day (BID) | ORAL | 0 refills | Status: DC

## 2021-10-26 NOTE — Telephone Encounter (Signed)
[name] notified by telephone that his previous Ambien refills were cancelled but Dr. Diona Browner has resent new refills today so he should be able to pick up Rx now at his pharmacy.  While one the phone,  [name] states at last appointment they discussed him going back on Lyrica 200 mg tid but [facility] did get that Rx . I do not see that a new Rx was sent on that day.  Office note not completed.  Please advise.

## 2021-10-26 NOTE — Telephone Encounter (Signed)
PDMP reviewed during this encounter.  I will only prescribe lyrica and ambien.  On PDMP he is receiving oxycodone through a different MD... 10/04/2021 as he is having acute issues post op... this was sent before we spoke about controlled substances.

## 2021-10-26 NOTE — Telephone Encounter (Signed)
Refilled. PDMP reviewed during this encounter.

## 2021-10-28 ENCOUNTER — Other Ambulatory Visit (INDEPENDENT_AMBULATORY_CARE_PROVIDER_SITE_OTHER): Payer: Self-pay | Admitting: Vascular Surgery

## 2021-10-28 DIAGNOSIS — Z9889 Other specified postprocedural states: Secondary | ICD-10-CM

## 2021-10-29 ENCOUNTER — Encounter (INDEPENDENT_AMBULATORY_CARE_PROVIDER_SITE_OTHER): Payer: Self-pay | Admitting: Nurse Practitioner

## 2021-10-29 ENCOUNTER — Ambulatory Visit (INDEPENDENT_AMBULATORY_CARE_PROVIDER_SITE_OTHER): Payer: PPO | Admitting: Nurse Practitioner

## 2021-10-29 ENCOUNTER — Ambulatory Visit (INDEPENDENT_AMBULATORY_CARE_PROVIDER_SITE_OTHER): Payer: PPO

## 2021-10-29 ENCOUNTER — Other Ambulatory Visit: Payer: Self-pay | Admitting: Podiatry

## 2021-10-29 VITALS — BP 152/76 | HR 88 | Resp 16 | Wt 188.0 lb

## 2021-10-29 DIAGNOSIS — G629 Polyneuropathy, unspecified: Secondary | ICD-10-CM | POA: Diagnosis not present

## 2021-10-29 DIAGNOSIS — I739 Peripheral vascular disease, unspecified: Secondary | ICD-10-CM

## 2021-10-29 DIAGNOSIS — Z9889 Other specified postprocedural states: Secondary | ICD-10-CM | POA: Diagnosis not present

## 2021-10-29 DIAGNOSIS — E1142 Type 2 diabetes mellitus with diabetic polyneuropathy: Secondary | ICD-10-CM

## 2021-10-29 DIAGNOSIS — I70213 Atherosclerosis of native arteries of extremities with intermittent claudication, bilateral legs: Secondary | ICD-10-CM | POA: Diagnosis not present

## 2021-10-29 DIAGNOSIS — Z96652 Presence of left artificial knee joint: Secondary | ICD-10-CM | POA: Diagnosis not present

## 2021-11-02 DIAGNOSIS — Z96652 Presence of left artificial knee joint: Secondary | ICD-10-CM | POA: Diagnosis not present

## 2021-11-03 DIAGNOSIS — M009 Pyogenic arthritis, unspecified: Secondary | ICD-10-CM | POA: Insufficient documentation

## 2021-11-03 NOTE — Assessment & Plan Note (Signed)
Well-controlled on metformin 1000 mg twice daily, ozempic and jardiance

## 2021-11-03 NOTE — Assessment & Plan Note (Signed)
  On lyrica.Marland Kitchen works best as 200 mg three times a day. Recent concern for pt getting multiple prescription from multiple prescribers.    He states he did take extra lyrica from the [place] prescriptions in.  He states he will no longer.  He understands he needs to get this from provide.  We may be agreement that he will only get it from me.  Follow PDMP very closely. PDMP reviewed during this encounter.

## 2021-11-03 NOTE — Assessment & Plan Note (Signed)
Using muscle relaxant and ambien to hekp sleep.  SE amitryptiline, caused grogginess.  Felt felt  sedation/flat affect with Cymbalta in past.

## 2021-11-03 NOTE — Assessment & Plan Note (Addendum)
Recent TKR on  07/2021, had joint infection... revision 10/12/2021  Started rehab.  HAs PICC line for antibiotic.. has 8 more days.  He is currently receiving oxycodone from his orthopedic doctor given recent surgery by joint infection and revision.

## 2021-11-07 ENCOUNTER — Encounter (INDEPENDENT_AMBULATORY_CARE_PROVIDER_SITE_OTHER): Payer: Self-pay | Admitting: Nurse Practitioner

## 2021-11-07 NOTE — Progress Notes (Signed)
Subjective:    Patient ID: Erik Martinez, male    DOB: September 13, 1955, 66 y.o.   MRN: [id number] Chief Complaint  Patient presents with   Follow-up    Ultrasound follow up    The patient returns to the office for followup and review of the noninvasive studies.   The patient notes that there has been a significant deterioration in the lower extremity symptoms.  The patient notes interval shortening of their claudication distance and development of mild rest pain symptoms. No new ulcers or wounds have occurred since the last visit.  The patient notes that his claudication has been much worse following his recent revision of his left knee.  The patient also has notable neuropathy.  There have been no significant changes to the patient's overall health care.  The patient denies amaurosis fugax or recent TIA symptoms. There are no recent neurological changes noted. There is no history of DVT, PE or superficial thrombophlebitis. The patient denies recent episodes of angina or shortness of breath.   The patient has noncompressible ABIs bilaterally with a TBI of 1.04 on the right and 0.54 on the left.  Additional lower extremity arterial duplex shows an occluded left popliteal artery stenosis.    Review of Systems  Neurological:  Positive for numbness.  All other systems reviewed and are negative.      Objective:   Physical Exam Vitals reviewed.  HENT:     Head: Normocephalic.  Cardiovascular:     Rate and Rhythm: Normal rate.     Pulses:          Dorsalis pedis pulses are detected w/ Doppler on the right side and detected w/ Doppler on the left side.       Posterior tibial pulses are detected w/ Doppler on the right side and detected w/ Doppler on the left side.  Pulmonary:     Effort: Pulmonary effort is normal.  Skin:    General: Skin is warm and dry.  Neurological:     Mental Status: He is alert and oriented to person, place, and time.  Psychiatric:        Mood and Affect:  Mood normal.        Behavior: Behavior normal.        Thought Content: Thought content normal.        Judgment: Judgment normal.     BP (!) 152/76 (BP Location: Right Arm)   Pulse 88   Resp 16   Wt 188 lb (85.3 kg)   BMI 26.98 kg/m   Past Medical History:  Diagnosis Date   Anxiety    Arthritis    Avascular necrosis of bones of both hips (HCC)    Bipolar disorder (HCC)    Bladder spasms    Chronic pain of left knee    Chronic pain syndrome    a.) followed by pain clinic   Cocaine use disorder, moderate, dependence ([place]) 09/09/2015   DDD (degenerative disc disease)    Depression    Erectile dysfunction    a.) on PDE5i (sildenafil)   Frequency-urgency syndrome    GERD (gastroesophageal reflux disease)    History of hepatitis B    1983  TX'D--  NO ISSUES OR SYMPTOMS SINCE   Hypotestosteronemia    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   Nocturia    Polysubstance abuse (HCC)    a.) cocaine + marijuana + BZO + opioids   PONV (postoperative nausea and vomiting)    "difficult to put  me to sleep"   Spinal headache    T2DM (type 2 diabetes mellitus) ([place])    Urine incontinence     Social History   Socioeconomic History   Marital status: Divorced    Spouse name: Not on file   Number of children: 3   Years of education: Not on file   Highest education level: Some college, no degree  Occupational History   Not on file  Tobacco Use   Smoking status: Never   Smokeless tobacco: Never  Vaping Use   Vaping Use: Never used  Substance and Sexual Activity   Alcohol use: Not Currently    Alcohol/week: 0.0 - 2.0 standard drinks of alcohol   Drug use: Not Currently    Types: Cocaine    Comment: pt states its been years since he used cocaine 6 yrs ago   Sexual activity: Not Currently  Other Topics Concern   Not on file  Social History Narrative    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, partime   Social Determinants of Health   Financial Resource Strain: Pace  (06/08/2021)   Overall  Financial Resource Strain (CARDIA)    Difficulty of Paying Living Expenses: Not very hard  Food Insecurity: No Food Insecurity (06/08/2021)   Hunger Vital Sign    Worried About Running Out of Food in the Last Year: Never true    Ran Out of Food in the Last Year: Never true  Transportation Needs: Not on file  Physical Activity: Not on file  Stress: Not on file  Social Connections: Not on file  Intimate Partner Violence: Not on file    Past Surgical History:  Procedure Laterality Date   APPLICATION OF WOUND VAC Left 09/11/2021   Procedure: APPLICATION OF WOUND VAC;  Surgeon: [name], MD;  Location: [facility];  Service: Orthopedics;  Laterality: Left;  [id number]    BACK SURGERY     2 rods and 4 screws artificial disc   INTERSTIM IMPLANT PLACEMENT  2007   INTERSTIM IMPLANT REVISION N/A 08/17/2012   Procedure: REPLACMENT OF IPG PLUS REPLACE LEAD OF INTERSTIM IMPLANT ;  Surgeon: [name], MD;  Location: [facility];  Service: Urology;  Laterality: N/A;   IRRIGATION AND DEBRIDEMENT KNEE Left 09/11/2021   Procedure: IRRIGATION AND DEBRIDEMENT WITH POLY EXCHANGE LEFT KNEE;  Surgeon: [name], MD;  Location: [facility];  Service: Orthopedics;  Laterality: Left;   JOINT REPLACEMENT     Lt knee and Lt hip   LOWER EXTREMITY ANGIOGRAPHY Left 11/17/2020   Procedure: LOWER EXTREMITY ANGIOGRAPHY;  Surgeon: Katha Cabal, MD;  Location: [facility];  Service: Cardiovascular;  Laterality: Left;   LOWER EXTREMITY ANGIOGRAPHY Right 12/22/2020   Procedure: LOWER EXTREMITY ANGIOGRAPHY;  Surgeon: Katha Cabal, MD;  Location: [facility];  Service: Cardiovascular;  Laterality: Right;   LUMBAR DISC SURGERY  2005   L5   LUMBAR FUSION  X2  2006  &  2007   OPEN DEBRIDEMENT LEFT TOTAL KNEE (SCAR, BONEY GRAOWTH)/ REMOVAL OLD SUTURES  05-01-2007   PARTIAL HIP ARTHROPLASTY Left 2001   x2   SHOULDER OPEN ROTATOR CUFF REPAIR Left 2000   TOTAL HIP  ARTHROPLASTY Left 2002   TOTAL KNEE ARTHROPLASTY Left    PARTIAL LEFT KNEE REPLACEMENT PRIOR TO THIS   TOTAL KNEE REVISION Left 2008   TOTAL KNEE REVISION Left 07/19/2021   Procedure: TOTAL KNEE REVISION;  Surgeon: [name], MD;  Location:  [facility];  Service: Orthopedics;  Laterality: Left;   TRIGGER FINGER RELEASE Bilateral    SEVERAL FINGERS   TRIGGER FINGER RELEASE Right 06/18/2014   Procedure: RELEASE TRIGGER FINGER/A-1 PULLEY;  Surgeon: [name], MD;  Location: [facility];  Service: Orthopedics;  Laterality: Right;    Family History  Problem Relation Age of Onset   Diabetes Mother    Arthritis Mother    Cancer Mother        lung cancer   Hyperlipidemia Mother    Stroke Mother    Alcohol abuse Father    Cancer Father        lung   Arthritis Father    Diabetes Brother     Allergies  Allergen Reactions   Suboxone [Buprenorphine Hcl-Naloxone Hcl] Nausea And Vomiting   Tape Other (See Comments)    [place]ps *Paper Tape is ok    Codeine Rash, Itching and Nausea Only   Morphine And Related Itching and Rash   Other Rash    Telemetry electrodes   Silicone Rash    [place] -*Paper Tape is ok        Latest Ref Rng & Units 10/22/2021    1:13 PM 10/18/2021    1:49 PM 10/11/2021    1:13 PM  CBC  WBC 4.0 - 10.5 K/uL 5.7  6.5  5.6   Hemoglobin 13.0 - 17.0 g/dL 11.8  11.7  11.6   Hematocrit 39.0 - 52.0 % 36.3  36.8  37.3   Platelets 150 - 400 K/uL 197  224  212       CMP     Component Value Date/Time   NA 138 10/22/2021 1313   NA 138 11/19/2020 0000   NA 142 05/23/2013 0329   K 4.2 10/22/2021 1313   K 3.8 05/23/2013 0329   CL 101 10/22/2021 1313   CL 109 (H) 05/23/2013 0329   CO2 28 10/22/2021 1313   CO2 27 05/23/2013 0329   GLUCOSE 144 (H) 10/22/2021 1313   GLUCOSE 151 (H) 05/23/2013 0329   BUN 23 10/22/2021 1313   BUN 19 11/19/2020 0000   BUN 5 (L) 05/23/2013 0329   CREATININE 1.07 10/22/2021 1313   CREATININE 0.98 05/27/2013 0301   CALCIUM 8.9  10/22/2021 1313   CALCIUM 8.5 05/23/2013 0329   PROT 8.4 (H) 10/22/2021 1313   PROT 6.2 (L) 05/21/2013 0530   ALBUMIN 3.4 (L) 10/22/2021 1313   ALBUMIN 2.9 (L) 05/21/2013 0530   AST 19 10/22/2021 1313   AST 25 05/21/2013 0530   ALT 8 10/22/2021 1313   ALT 32 05/21/2013 0530   ALKPHOS 70 10/22/2021 1313   ALKPHOS 51 05/21/2013 0530   BILITOT 0.7 10/22/2021 1313   BILITOT 0.7 05/21/2013 0530   GFRNONAA >60 10/22/2021 1313   GFRNONAA >60 05/27/2013 0301   GFRAA >60 12/04/2016 0052   GFRAA >60 05/27/2013 0301     VAS [place] ABI WITH/WO TBI  Result Date: 11/01/2021  LOWER EXTREMITY DOPPLER STUDY Patient Name:  YARDEN HILLIS  Date of Exam:   10/29/2021 Medical Rec #: [id number]       Accession #:    [id number] Date of Birth: December 31, 1955      Patient Gender: M Patient Age:   51 years Exam Location:  [facility] Procedure:      VAS [place] ABI WITH/WO TBI Referring Phys: [facility] --------------------------------------------------------------------------------  Indications: Peripheral artery disease. High Risk Factors: Hyperlipidemia, Diabetes, no history of smoking.  Vascular Interventions:  11/17/2020 Left popliteal artery stent. Performing Technologist: Delorise Shiner RVT  Examination Guidelines: A complete evaluation includes at minimum, Doppler waveform signals and systolic blood pressure reading at the level of bilateral brachial, anterior tibial, and posterior tibial arteries, when vessel segments are accessible. Bilateral testing is considered an integral part of a complete examination. Photoelectric Plethysmograph (PPG) waveforms and toe systolic pressure readings are included as required and additional duplex testing as needed. Limited examinations for reoccurring indications may be performed as noted.  ABI Findings: +---------+------------------+-----+--------+--------+ Right    Rt Pressure (mmHg)IndexWaveformComment  +---------+------------------+-----+--------+--------+  Brachial 137                                     +---------+------------------+-----+--------+--------+ ATA      255               1.86                  +---------+------------------+-----+--------+--------+ PTA      255               1.86                  +---------+------------------+-----+--------+--------+ Great Toe143               1.04                  +---------+------------------+-----+--------+--------+ +---------+------------------+-----+--------+-------+ Left     Lt Pressure (mmHg)IndexWaveformComment +---------+------------------+-----+--------+-------+ Brachial 133                                    +---------+------------------+-----+--------+-------+ ATA      255               1.86                 +---------+------------------+-----+--------+-------+ PTA      123               0.90                 +---------+------------------+-----+--------+-------+ Great Toe74                0.54                 +---------+------------------+-----+--------+-------+ +-------+----------------+-----------+----------------+------------+ ABI/TBIToday's ABI     Today's TBIPrevious ABI    Previous TBI +-------+----------------+-----------+----------------+------------+ Right  Non compressible1.04       Non compressible1.15         +-------+----------------+-----------+----------------+------------+ Left   Non compressible0.54       Non compressible0.92         +-------+----------------+-----------+----------------+------------+  Arterial wall calcification precludes accurate ankle pressures and ABIs.  Summary: Right: Resting right ankle-brachial index indicates noncompressible right lower extremity arteries. The right toe-brachial index is normal. Left: Resting left ankle-brachial index indicates noncompressible left lower extremity arteries. The left toe-brachial index is abnormal. *See table(s) above for measurements and observations.  Electronically  signed by [name] MD on 11/01/2021 at 1:19:51 PM.    Final        Assessment & Plan:   1. Atherosclerosis of native artery of both lower extremities with intermittent claudication (HCC) Recommend:  The patient has experienced increased claudication symptoms and is now describing lifestyle limiting claudication and appears to be having mild rest pain symptroms.  Given the severity of the patient's severe  left lower extremity symptoms the patient should undergo angiography with the hope for intervention.  Risk and benefits were reviewed the patient.  Indications for the procedure were reviewed.  All questions were answered, the patient agrees to proceed with left lower extremity angiography and possible intervention.   The patient should continue walking and begin a more formal exercise program.  The patient should continue antiplatelet therapy and aggressive treatment of the lipid abnormalities  The patient will follow up with me after the angiogram.    2. Neuropathy The patient has profound neuropathy and he notes that has been worsening bilaterally.  We will refer the patient to neurology for further treatment and evaluation of his longstanding neuropathy. - Ambulatory referral to Neurology  3. Type 2 diabetes mellitus with diabetic polyneuropathy, without long-term current use of insulin (HCC) Continue hypoglycemic medications as already ordered, these medications have been reviewed and there are no changes at this time.  Hgb A1C to be monitored as already arranged by primary service    Current Outpatient Medications on File Prior to Visit  Medication Sig Dispense Refill   acetaminophen (TYLENOL) 500 MG tablet Take 1,500 mg by mouth 2 (two) times daily as needed for moderate pain or mild pain.     atorvastatin (LIPITOR) 20 MG tablet Take 20 mg by mouth at bedtime.     cephALEXin (KEFLEX) 500 MG capsule Take 1 capsule (500 mg total) by mouth 4 (four) times daily. 120 capsule  4   cyclobenzaprine (FLEXERIL) 10 MG tablet SMARTSIG:1 Tablet(s) By Mouth Every 12 Hours     diclofenac (VOLTAREN) 75 MG EC tablet Take 75 mg by mouth 2 (two) times daily.     empagliflozin (JARDIANCE) 25 MG TABS tablet Take 1 tablet (25 mg total) by mouth daily. 90 tablet 1   famotidine (PEPCID) 20 MG tablet Take 1 tablet (20 mg total) by mouth 2 (two) times daily. 15 tablet 0   ferrous sulfate 324 MG TBEC TAKE ONE TABLET BY MOUTH MONDAY,WEDNESDAY,FRIDAY     lidocaine (LIDODERM) 5 % Place 1 patch onto the skin daily. Remove & Discard patch within 12 hours or as directed by MD 30 patch 0   metFORMIN (GLUCOPHAGE) 1000 MG tablet Take 500 mg by mouth 2 (two) times daily.     Multiple Vitamin (MULTIVITAMIN WITH MINERALS) TABS tablet Take 1 tablet by mouth in the morning.     oxyCODONE (ROXICODONE) 15 MG immediate release tablet Take 15 mg by mouth every 6 (six) hours.     OZEMPIC, 0.25 OR 0.5 MG/DOSE, 2 MG/3ML SOPN Inject 0.5 mg into the skin once a week.     pantoprazole (PROTONIX) 40 MG tablet Take 1 tablet (40 mg total) by mouth daily. 90 tablet 3   pregabalin (LYRICA) 300 MG capsule Take 1 capsule (300 mg total) by mouth 2 (two) times daily. 60 capsule 0   rifampin (RIFADIN) 300 MG capsule Take 1 capsule (300 mg total) by mouth 2 (two) times daily. 60 capsule 4   terbinafine (LAMISIL) 250 MG tablet Take 1 tablet (250 mg total) by mouth daily. 90 tablet 0   zolpidem (AMBIEN) 10 MG tablet Take 1 tablet (10 mg total) by mouth at bedtime as needed. for sleep 30 tablet 2   No current facility-administered medications on file prior to visit.    There are no Patient Instructions on file for this visit. No follow-ups on file.   Kris Hartmann, NP

## 2021-11-08 ENCOUNTER — Encounter (INDEPENDENT_AMBULATORY_CARE_PROVIDER_SITE_OTHER): Payer: Self-pay

## 2021-11-09 DIAGNOSIS — M4802 Spinal stenosis, cervical region: Secondary | ICD-10-CM | POA: Diagnosis not present

## 2021-11-09 DIAGNOSIS — M5136 Other intervertebral disc degeneration, lumbar region: Secondary | ICD-10-CM | POA: Diagnosis not present

## 2021-11-09 DIAGNOSIS — G890 Central pain syndrome: Secondary | ICD-10-CM | POA: Diagnosis not present

## 2021-11-09 DIAGNOSIS — Z981 Arthrodesis status: Secondary | ICD-10-CM | POA: Diagnosis not present

## 2021-11-09 DIAGNOSIS — M545 Low back pain, unspecified: Secondary | ICD-10-CM | POA: Diagnosis not present

## 2021-11-09 DIAGNOSIS — G894 Chronic pain syndrome: Secondary | ICD-10-CM | POA: Diagnosis not present

## 2021-11-09 DIAGNOSIS — Z79891 Long term (current) use of opiate analgesic: Secondary | ICD-10-CM | POA: Diagnosis not present

## 2021-11-09 DIAGNOSIS — M160 Bilateral primary osteoarthritis of hip: Secondary | ICD-10-CM | POA: Diagnosis not present

## 2021-11-09 DIAGNOSIS — M25512 Pain in left shoulder: Secondary | ICD-10-CM | POA: Diagnosis not present

## 2021-11-10 ENCOUNTER — Telehealth (INDEPENDENT_AMBULATORY_CARE_PROVIDER_SITE_OTHER): Payer: Self-pay

## 2021-11-10 NOTE — Telephone Encounter (Signed)
I attempted to contact the patient to schedule him for a LLE angio with Dr. Delana Meyer. A message was left for a return call.

## 2021-11-23 ENCOUNTER — Other Ambulatory Visit: Payer: Self-pay | Admitting: Family Medicine

## 2021-11-24 ENCOUNTER — Telehealth (INDEPENDENT_AMBULATORY_CARE_PROVIDER_SITE_OTHER): Payer: Self-pay

## 2021-11-24 MED ORDER — PREGABALIN 200 MG PO CAPS: 200.0000 mg | ORAL_CAPSULE | Freq: Three times a day (TID) | ORAL | 0 refills | Status: DC

## 2021-11-24 NOTE — Telephone Encounter (Signed)
Patient called in to follow up on the refill request. He stated it was suppose to be changed to 200 MG 3 times a day instead of 300 MG 2 times. He stated he will be out on the 12/01/2021.

## 2021-11-24 NOTE — Telephone Encounter (Signed)
I attempted to contact the patient to give him the information regarding his upcoming LLE angio with Dr. Delana Meyer where he agreed to having his procedure on 12/15/21. [facility] Pre-procedure instructions were discussed earlier and will be mailed.

## 2021-11-24 NOTE — Telephone Encounter (Signed)
Last office visit 10/15/21 for DM/Peripheral Neuropathy.  Last refilled 10/26/21 for #60 with no refills.  Next Appt: 04/19/2022 for DM.

## 2021-11-24 NOTE — Telephone Encounter (Signed)
Erik Martinez notified that Rx for Lyrica 200 mg to take tid has been sent to his pharmacy.

## 2021-11-25 ENCOUNTER — Telehealth: Payer: Self-pay | Admitting: Family Medicine

## 2021-11-25 DIAGNOSIS — R7989 Other specified abnormal findings of blood chemistry: Secondary | ICD-10-CM

## 2021-11-25 NOTE — Telephone Encounter (Signed)
Spoke with Mr. Kirtz.  He states it is Dr. Ronnald Collum at  [place], [place]

## 2021-11-25 NOTE — Telephone Encounter (Signed)
Given persistent and difficult to treat I would recommend following up with podiatrist.  In meantime I would be happy to refill the Lamisil.  Please enter refill request for me to review and sign.

## 2021-11-25 NOTE — Telephone Encounter (Signed)
Please contact patient.  Is this a referral to a urologist?  That is usually usually does testosterone injections for hypogonadism/low testosterone.

## 2021-11-25 NOTE — Telephone Encounter (Signed)
Erik Martinez notified by telephone that Referral has been ordered as requested. While one the phone he states he has seen a foot doctor for toe nail fungus..  He has had laser treatment for it , then was placed on Lamisil.  He has been on that for about 3 months. He states one foot looks great but the other one is not looking that good.   He is having a hard time getting a refill from the podiatrist.  He is asking is there anything better that he could be using?  He is also asking if it is better to see his PCP for this or continue seeing the Podiatrist.  Please advise.

## 2021-11-25 NOTE — Telephone Encounter (Signed)
Let pt know referral sent.

## 2021-11-25 NOTE — Addendum Note (Signed)
Addended by: Eliezer Lofts E on: 11/25/2021 04:51 PM   Modules accepted: Orders

## 2021-11-25 NOTE — Telephone Encounter (Signed)
Patient called in and stated he would like for a referral to be sent over to [facility] to start back on Testosterone shots. He stated that the pellets don't work. Please advise. Thank you!

## 2021-11-26 ENCOUNTER — Telehealth: Payer: Self-pay | Admitting: Podiatry

## 2021-11-26 ENCOUNTER — Other Ambulatory Visit: Payer: Self-pay | Admitting: Podiatry

## 2021-11-26 MED ORDER — TERBINAFINE HCL 250 MG PO TABS: 250.0000 mg | ORAL_TABLET | Freq: Every day | ORAL | 0 refills | Status: DC

## 2021-11-26 NOTE — Telephone Encounter (Signed)
Hepatic function panel 10/12/2021 WNL.  Refill Lamisil 250 mg #90 daily.  Follow-up in office 3 months.  Thanks milligrams

## 2021-11-26 NOTE — Addendum Note (Signed)
Addended by: Eliezer Lofts E on: 11/26/2021 08:36 AM   Modules accepted: Orders

## 2021-11-26 NOTE — Telephone Encounter (Signed)
Erik Martinez notified as instructed by telephone.  Patient states understanding.

## 2021-11-26 NOTE — Progress Notes (Signed)
Hepatic function panel 10/12/2021 was WNL.  Refill for Lamisil 250 mg #90 daily.

## 2021-11-26 NOTE — Telephone Encounter (Signed)
Pt called in requesting a Rx refill for Lamisil  Please advise

## 2021-11-26 NOTE — Addendum Note (Signed)
Addended by: Carter Kitten on: 11/26/2021 08:10 AM   Modules accepted: Orders

## 2021-11-30 NOTE — Telephone Encounter (Signed)
Error in opening

## 2021-12-07 DIAGNOSIS — G894 Chronic pain syndrome: Secondary | ICD-10-CM | POA: Diagnosis not present

## 2021-12-07 DIAGNOSIS — M4802 Spinal stenosis, cervical region: Secondary | ICD-10-CM | POA: Diagnosis not present

## 2021-12-07 DIAGNOSIS — M545 Low back pain, unspecified: Secondary | ICD-10-CM | POA: Diagnosis not present

## 2021-12-07 DIAGNOSIS — M160 Bilateral primary osteoarthritis of hip: Secondary | ICD-10-CM | POA: Diagnosis not present

## 2021-12-07 DIAGNOSIS — G890 Central pain syndrome: Secondary | ICD-10-CM | POA: Diagnosis not present

## 2021-12-07 DIAGNOSIS — Z981 Arthrodesis status: Secondary | ICD-10-CM | POA: Diagnosis not present

## 2021-12-07 DIAGNOSIS — M5136 Other intervertebral disc degeneration, lumbar region: Secondary | ICD-10-CM | POA: Diagnosis not present

## 2021-12-07 DIAGNOSIS — Z79891 Long term (current) use of opiate analgesic: Secondary | ICD-10-CM | POA: Diagnosis not present

## 2021-12-07 DIAGNOSIS — M25512 Pain in left shoulder: Secondary | ICD-10-CM | POA: Diagnosis not present

## 2021-12-08 ENCOUNTER — Telehealth: Payer: Self-pay | Admitting: Family Medicine

## 2021-12-08 NOTE — Telephone Encounter (Signed)
Erik Martinez from [facility] called stating a referra was sent in for the pt from [place] but there wasn't any office note attached. Erik Martinez is asking can the office note be faxed over to their office @ [phone].

## 2021-12-08 NOTE — Telephone Encounter (Signed)
Last office visit faxed to [facility] at [phone].

## 2021-12-14 ENCOUNTER — Encounter: Payer: Self-pay | Admitting: Vascular Surgery

## 2021-12-14 ENCOUNTER — Ambulatory Visit: Payer: PPO | Admitting: Certified Registered"

## 2021-12-14 ENCOUNTER — Ambulatory Visit
Admission: RE | Admit: 2021-12-14 | Discharge: 2021-12-14 | Disposition: A | Discharge status: Home / Self Care | Payer: PPO | Attending: Vascular Surgery | Admitting: Vascular Surgery

## 2021-12-14 ENCOUNTER — Encounter
Admission: RE | Disposition: A | Discharge status: Home / Self Care | Payer: Self-pay | Source: Home / Self Care | Attending: Vascular Surgery

## 2021-12-14 ENCOUNTER — Other Ambulatory Visit: Payer: Self-pay

## 2021-12-14 DIAGNOSIS — E1142 Type 2 diabetes mellitus with diabetic polyneuropathy: Secondary | ICD-10-CM | POA: Insufficient documentation

## 2021-12-14 DIAGNOSIS — I700 Atherosclerosis of aorta: Secondary | ICD-10-CM

## 2021-12-14 DIAGNOSIS — K219 Gastro-esophageal reflux disease without esophagitis: Secondary | ICD-10-CM | POA: Diagnosis not present

## 2021-12-14 DIAGNOSIS — Z8619 Personal history of other infectious and parasitic diseases: Secondary | ICD-10-CM | POA: Diagnosis not present

## 2021-12-14 DIAGNOSIS — T82868A Thrombosis of vascular prosthetic devices, implants and grafts, initial encounter: Secondary | ICD-10-CM | POA: Diagnosis not present

## 2021-12-14 DIAGNOSIS — T82856A Stenosis of peripheral vascular stent, initial encounter: Secondary | ICD-10-CM

## 2021-12-14 DIAGNOSIS — E119 Type 2 diabetes mellitus without complications: Secondary | ICD-10-CM | POA: Diagnosis not present

## 2021-12-14 DIAGNOSIS — I70222 Atherosclerosis of native arteries of extremities with rest pain, left leg: Secondary | ICD-10-CM | POA: Diagnosis not present

## 2021-12-14 DIAGNOSIS — E1151 Type 2 diabetes mellitus with diabetic peripheral angiopathy without gangrene: Secondary | ICD-10-CM | POA: Diagnosis not present

## 2021-12-14 DIAGNOSIS — I70229 Atherosclerosis of native arteries of extremities with rest pain, unspecified extremity: Secondary | ICD-10-CM

## 2021-12-14 DIAGNOSIS — Z95828 Presence of other vascular implants and grafts: Secondary | ICD-10-CM | POA: Diagnosis not present

## 2021-12-14 DIAGNOSIS — F418 Other specified anxiety disorders: Secondary | ICD-10-CM | POA: Diagnosis not present

## 2021-12-14 HISTORY — PX: LOWER EXTREMITY ANGIOGRAPHY: CATH118251

## 2021-12-14 LAB — BUN: BUN: 26 mg/dL — ABNORMAL HIGH (ref 8–23)

## 2021-12-14 LAB — GLUCOSE, CAPILLARY: Glucose-Capillary: 117 mg/dL — ABNORMAL HIGH (ref 70–99)

## 2021-12-14 LAB — CREATININE, SERUM
Creatinine, Ser: 0.9 mg/dL (ref 0.61–1.24)
GFR, Estimated: >60 mL/min (ref >60)

## 2021-12-14 SURGERY — LOWER EXTREMITY ANGIOGRAPHY
Anesthesia: General | Site: Leg Lower | Laterality: Left

## 2021-12-14 MED ORDER — HYDROMORPHONE HCL 1 MG/ML IJ SOLN: 1.0000 mg | Freq: Once | INTRAMUSCULAR | Status: DC | PRN

## 2021-12-14 MED ORDER — ASPIRIN 81 MG PO TBEC: 81.0000 mg | DELAYED_RELEASE_TABLET | Freq: Every day | ORAL | 1 refills | Status: DC

## 2021-12-14 MED ORDER — ROCURONIUM BROMIDE 10 MG/ML (PF) SYRINGE
PREFILLED_SYRINGE | INTRAVENOUS | Status: AC
  Filled 2021-12-14: qty 10

## 2021-12-14 MED ORDER — SODIUM CHLORIDE 0.9 % IV SOLN: INTRAVENOUS | Status: DC

## 2021-12-14 MED ORDER — DIPHENHYDRAMINE HCL 50 MG/ML IJ SOLN: 50.0000 mg | Freq: Once | INTRAMUSCULAR | Status: DC | PRN

## 2021-12-14 MED ORDER — ONDANSETRON HCL 4 MG/2ML IJ SOLN: 4.0000 mg | Freq: Four times a day (QID) | INTRAMUSCULAR | Status: DC | PRN

## 2021-12-14 MED ORDER — CEFAZOLIN SODIUM-DEXTROSE 2-4 GM/100ML-% IV SOLN
2.0000 g | INTRAVENOUS | Status: AC
  Administered 2021-12-14: 2 g via INTRAVENOUS

## 2021-12-14 MED ORDER — PHENYLEPHRINE HCL (PRESSORS) 10 MG/ML IV SOLN
INTRAVENOUS | Status: AC
  Filled 2021-12-14: qty 1

## 2021-12-14 MED ORDER — OXYCODONE HCL 5 MG PO TABS: 5.0000 mg | ORAL_TABLET | ORAL | Status: DC | PRN

## 2021-12-14 MED ORDER — METHYLPREDNISOLONE SODIUM SUCC 125 MG IJ SOLR: 125.0000 mg | Freq: Once | INTRAMUSCULAR | Status: DC | PRN

## 2021-12-14 MED ORDER — SODIUM CHLORIDE 0.9 % IV SOLN: 250.0000 mL | INTRAVENOUS | Status: DC | PRN

## 2021-12-14 MED ORDER — DEXAMETHASONE SODIUM PHOSPHATE 10 MG/ML IJ SOLN
INTRAMUSCULAR | Status: AC
  Filled 2021-12-14: qty 1

## 2021-12-14 MED ORDER — FENTANYL CITRATE (PF) 100 MCG/2ML IJ SOLN
INTRAMUSCULAR | Status: AC
  Filled 2021-12-14: qty 2

## 2021-12-14 MED ORDER — ACETAMINOPHEN 10 MG/ML IV SOLN
INTRAVENOUS | Status: DC | PRN
  Administered 2021-12-14: 1000 mg via INTRAVENOUS

## 2021-12-14 MED ORDER — ONDANSETRON HCL 4 MG/2ML IJ SOLN
INTRAMUSCULAR | Status: DC | PRN
  Administered 2021-12-14 (×2): 4 mg via INTRAVENOUS

## 2021-12-14 MED ORDER — SUCCINYLCHOLINE CHLORIDE 200 MG/10ML IV SOSY
PREFILLED_SYRINGE | INTRAVENOUS | Status: DC | PRN
  Administered 2021-12-14: 100 mg via INTRAVENOUS

## 2021-12-14 MED ORDER — GLYCOPYRROLATE 0.2 MG/ML IJ SOLN
INTRAMUSCULAR | Status: AC
  Filled 2021-12-14: qty 1

## 2021-12-14 MED ORDER — IODIXANOL 320 MG/ML IV SOLN
INTRAVENOUS | Status: DC | PRN
  Administered 2021-12-14: 65 mL

## 2021-12-14 MED ORDER — CEFAZOLIN SODIUM-DEXTROSE 2-4 GM/100ML-% IV SOLN
INTRAVENOUS | Status: AC
  Filled 2021-12-14: qty 100

## 2021-12-14 MED ORDER — LIDOCAINE HCL (CARDIAC) PF 100 MG/5ML IV SOSY
PREFILLED_SYRINGE | INTRAVENOUS | Status: DC | PRN
  Administered 2021-12-14: 100 mg via INTRAVENOUS

## 2021-12-14 MED ORDER — GLYCOPYRROLATE 0.2 MG/ML IJ SOLN
INTRAMUSCULAR | Status: DC | PRN
  Administered 2021-12-14: .2 mg via INTRAVENOUS

## 2021-12-14 MED ORDER — HYDRALAZINE HCL 20 MG/ML IJ SOLN: 5.0000 mg | INTRAMUSCULAR | Status: DC | PRN

## 2021-12-14 MED ORDER — SODIUM CHLORIDE 0.9% FLUSH: 3.0000 mL | INTRAVENOUS | Status: DC | PRN

## 2021-12-14 MED ORDER — ONDANSETRON HCL 4 MG/2ML IJ SOLN
INTRAMUSCULAR | Status: AC
  Filled 2021-12-14: qty 4

## 2021-12-14 MED ORDER — SUCCINYLCHOLINE CHLORIDE 200 MG/10ML IV SOSY
PREFILLED_SYRINGE | INTRAVENOUS | Status: AC
  Filled 2021-12-14: qty 10

## 2021-12-14 MED ORDER — PHENYLEPHRINE HCL-NACL 20-0.9 MG/250ML-% IV SOLN
INTRAVENOUS | Status: AC
  Filled 2021-12-14: qty 250

## 2021-12-14 MED ORDER — DEXAMETHASONE SODIUM PHOSPHATE 10 MG/ML IJ SOLN
INTRAMUSCULAR | Status: DC | PRN
  Administered 2021-12-14: 5 mg via INTRAVENOUS

## 2021-12-14 MED ORDER — PROPOFOL 10 MG/ML IV BOLUS
INTRAVENOUS | Status: DC | PRN
  Administered 2021-12-14: 200 mg via INTRAVENOUS

## 2021-12-14 MED ORDER — FENTANYL CITRATE (PF) 100 MCG/2ML IJ SOLN
INTRAMUSCULAR | Status: DC | PRN
  Administered 2021-12-14: 100 ug via INTRAVENOUS

## 2021-12-14 MED ORDER — HYDROMORPHONE HCL 1 MG/ML IJ SOLN: 0.5000 mg | INTRAMUSCULAR | Status: DC | PRN

## 2021-12-14 MED ORDER — ACETAMINOPHEN 10 MG/ML IV SOLN
INTRAVENOUS | Status: AC
  Filled 2021-12-14: qty 100

## 2021-12-14 MED ORDER — CLOPIDOGREL BISULFATE 75 MG PO TABS: 75.0000 mg | ORAL_TABLET | Freq: Every day | ORAL | 5 refills | Status: DC

## 2021-12-14 MED ORDER — PHENYLEPHRINE 80 MCG/ML (10ML) SYRINGE FOR IV PUSH (FOR BLOOD PRESSURE SUPPORT)
PREFILLED_SYRINGE | INTRAVENOUS | Status: DC | PRN
  Administered 2021-12-14: 160 ug via INTRAVENOUS
  Administered 2021-12-14: 80 ug via INTRAVENOUS
  Administered 2021-12-14: 160 ug via INTRAVENOUS

## 2021-12-14 MED ORDER — CEFAZOLIN SODIUM 1 G IJ SOLR
INTRAMUSCULAR | Status: AC
  Filled 2021-12-14: qty 10

## 2021-12-14 MED ORDER — MIDAZOLAM HCL 2 MG/ML PO SYRP: 8.0000 mg | ORAL_SOLUTION | Freq: Once | ORAL | Status: DC | PRN

## 2021-12-14 MED ORDER — ACETAMINOPHEN 325 MG PO TABS: 650.0000 mg | ORAL_TABLET | ORAL | Status: DC | PRN

## 2021-12-14 MED ORDER — ROCURONIUM BROMIDE 100 MG/10ML IV SOLN
INTRAVENOUS | Status: DC | PRN
  Administered 2021-12-14: 20 mg via INTRAVENOUS
  Administered 2021-12-14: 40 mg via INTRAVENOUS
  Administered 2021-12-14: 30 mg via INTRAVENOUS

## 2021-12-14 MED ORDER — SUGAMMADEX SODIUM 200 MG/2ML IV SOLN
INTRAVENOUS | Status: DC | PRN
  Administered 2021-12-14: 200 mg via INTRAVENOUS

## 2021-12-14 MED ORDER — SODIUM CHLORIDE 0.9% FLUSH: 3.0000 mL | Freq: Two times a day (BID) | INTRAVENOUS | Status: DC

## 2021-12-14 MED ORDER — FAMOTIDINE 20 MG PO TABS: 40.0000 mg | ORAL_TABLET | Freq: Once | ORAL | Status: DC | PRN

## 2021-12-14 MED ORDER — PHENYLEPHRINE HCL-NACL 20-0.9 MG/250ML-% IV SOLN
INTRAVENOUS | Status: DC | PRN
  Administered 2021-12-14: 25 ug/min via INTRAVENOUS

## 2021-12-14 MED ORDER — HEPARIN SODIUM (PORCINE) 1000 UNIT/ML IJ SOLN
INTRAMUSCULAR | Status: DC | PRN
  Administered 2021-12-14: 7000 [IU] via INTRAVENOUS

## 2021-12-14 MED ORDER — LABETALOL HCL 5 MG/ML IV SOLN: 10.0000 mg | INTRAVENOUS | Status: DC | PRN

## 2021-12-14 SURGICAL SUPPLY — 25 items
BALLN LUTONIX 018 5X60X130 (BALLOONS) ×1
BALLN LUTONIX 018 6X60X130 (BALLOONS) ×1
BALLOON LUTONIX 018 5X60X130 (BALLOONS) IMPLANT
BALLOON LUTONIX 018 6X60X130 (BALLOONS) IMPLANT
CATH ANGIO 5F PIGTAIL 100CM (CATHETERS) IMPLANT
CATH ROTAREX 135 6FR (CATHETERS) IMPLANT
CATH VERT 5FR 125CM (CATHETERS) IMPLANT
COVER DRAPE FLUORO 36X44 (DRAPES) IMPLANT
DEVICE STARCLOSE SE CLOSURE (Vascular Products) IMPLANT
GLIDEWIRE ADV .035X260CM (WIRE) IMPLANT
GOWN STRL REUS W/ TWL LRG LVL3 (GOWN DISPOSABLE) ×1 IMPLANT
GOWN STRL REUS W/TWL LRG LVL3 (GOWN DISPOSABLE) ×1
KIT ENCORE 26 ADVANTAGE (KITS) IMPLANT
NDL ENTRY 21GA 7CM ECHOTIP (NEEDLE) IMPLANT
NEEDLE ENTRY 21GA 7CM ECHOTIP (NEEDLE) ×1 IMPLANT
PACK ANGIOGRAPHY (CUSTOM PROCEDURE TRAY) ×1 IMPLANT
SET INTRO CAPELLA COAXIAL (SET/KITS/TRAYS/PACK) IMPLANT
SHEATH BRITE TIP 5FRX11 (SHEATH) IMPLANT
SHEATH SHUTTLE 7FR (SHEATH) IMPLANT
STENT VIABAHN 7X7.5X120 (Permanent Stent) IMPLANT
SYR MEDRAD MARK 7 150ML (SYRINGE) IMPLANT
TUBING CONTRAST HIGH PRESS 72 (TUBING) IMPLANT
VALVE CHECKFLO PERFORMER (SHEATH) IMPLANT
WIRE G V18X300CM (WIRE) IMPLANT
WIRE GUIDERIGHT .035X150 (WIRE) IMPLANT

## 2021-12-14 NOTE — Transfer of Care (Signed)
Immediate Anesthesia Transfer of Care Note  Patient: Erik Martinez  Procedure(s) Performed: Lower Extremity Angiography (Left: Leg Lower)  Patient Location: PACU  Anesthesia Type:General  Level of Consciousness: awake, drowsy, and patient cooperative  Airway & Oxygen Therapy: Patient Spontanous Breathing and Patient connected to face mask oxygen  Post-op Assessment: Report given to RN and Post -op Vital signs reviewed and stable  Post vital signs: Reviewed and stable  Last Vitals:  Vitals Value Taken Time  BP 131/78 12/14/21 1401  Temp    Pulse 82 12/14/21 1406  Resp 19 12/14/21 1406  SpO2 100 % 12/14/21 1406  Vitals shown include unvalidated device data.  Last Pain:  Vitals:   12/14/21 1206  TempSrc: Oral  PainSc: 5          Complications: No notable events documented.

## 2021-12-14 NOTE — H&P (View-Only) (Signed)
MRN : [id number]  Erik Martinez is a 66 y.o. (08-25-1955) male who presents with chief complaint of check circulation.  History of Present Illness:  Patient presents to [facility] for angiography and intervention of his left lower extremity  The patient notes that there has been a significant deterioration in the lower extremity symptoms.  The patient notes interval shortening of their claudication distance and development of mild rest pain symptoms. No new ulcers or wounds have occurred since the last visit.  The patient notes that his claudication has been much worse following his recent revision of his left knee.  The patient also has notable neuropathy.   There have been no significant changes to the patient's overall health care.   The patient denies amaurosis fugax or recent TIA symptoms. There are no recent neurological changes noted. There is no history of DVT, PE or superficial thrombophlebitis. The patient denies recent episodes of angina or shortness of breath.    The patient has noncompressible ABIs bilaterally with a TBI of 1.04 on the right and 0.54 on the left.  Additional lower extremity arterial duplex shows an occluded left popliteal artery stenosis.    No outpatient medications have been marked as taking for the 12/14/21 encounter [facility] Encounter).    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   Nocturia    Polysubstance abuse (HCC)    a.) cocaine + marijuana + BZO + opioids   PONV (postoperative nausea and vomiting)    "difficult to put me to sleep"   Spinal headache    T2DM (type 2 diabetes mellitus) ([place])    Urine incontinence     Past Surgical History:  Procedure Laterality Date   APPLICATION OF WOUND VAC Left 09/11/2021   Procedure: APPLICATION OF WOUND VAC;  Surgeon: [name], MD;  Location: [facility];  Service: Orthopedics;  Laterality: Left;  [id number]    BACK SURGERY     2 rods and 4 screws artificial disc   INTERSTIM IMPLANT PLACEMENT  2007   INTERSTIM IMPLANT REVISION N/A 08/17/2012   Procedure: REPLACMENT OF IPG PLUS REPLACE LEAD OF INTERSTIM IMPLANT ;  Surgeon: [name], MD;  Location: [place];  Service: Urology;  Laterality: N/A;   IRRIGATION AND DEBRIDEMENT KNEE Left 09/11/2021   Procedure: IRRIGATION AND DEBRIDEMENT WITH POLY EXCHANGE LEFT KNEE;  Surgeon: [name], MD;  Location: [facility];  Service: Orthopedics;  Laterality: Left;   JOINT REPLACEMENT     Lt knee and Lt hip   LOWER EXTREMITY ANGIOGRAPHY Left 11/17/2020   Procedure: LOWER EXTREMITY ANGIOGRAPHY;  Surgeon: Katha Cabal, MD;  Location: [place] [facility];  Service: Cardiovascular;  Laterality: Left;   LOWER EXTREMITY ANGIOGRAPHY Right 12/22/2020   Procedure: LOWER EXTREMITY ANGIOGRAPHY;  Surgeon: Katha Cabal, MD;  Location: [place] [facility];  Service: Cardiovascular;  Laterality: Right;   LUMBAR DISC SURGERY  2005   L5   LUMBAR FUSION  X2  2006  &  2007   OPEN DEBRIDEMENT LEFT TOTAL KNEE (SCAR, BONEY GRAOWTH)/ REMOVAL OLD SUTURES  05-01-2007   PARTIAL HIP ARTHROPLASTY Left 2001   x2   SHOULDER OPEN ROTATOR CUFF REPAIR Left 2000   TOTAL HIP ARTHROPLASTY Left 2002   TOTAL KNEE ARTHROPLASTY Left    PARTIAL LEFT KNEE REPLACEMENT PRIOR TO THIS   TOTAL KNEE REVISION Left 2008   TOTAL KNEE REVISION Left  07/19/2021   Procedure: TOTAL KNEE REVISION;  Surgeon: [name], MD;  Location: [facility];  Service: Orthopedics;  Laterality: Left;   TRIGGER FINGER RELEASE Bilateral    SEVERAL FINGERS   TRIGGER FINGER RELEASE Right 06/18/2014   Procedure: RELEASE TRIGGER FINGER/A-1 PULLEY;  Surgeon: [name], MD;  Location: [facility];  Service: Orthopedics;  Laterality: Right;    Social History Social History   Tobacco Use   Smoking status: Never   Smokeless tobacco: Never  Vaping Use   Vaping Use: Never used  Substance Use Topics   Alcohol use: Not Currently    Alcohol/week: 0.0 - 2.0 standard drinks of alcohol   Drug use: Not Currently    Types: Cocaine    Comment: pt states its been years since he used cocaine 6 yrs ago    Family History Family History  Problem Relation Age of Onset   Diabetes Mother    Arthritis Mother    Cancer Mother        lung cancer   Hyperlipidemia Mother    Stroke Mother    Alcohol abuse Father    Cancer Father        lung   Arthritis Father    Diabetes Brother     Allergies  Allergen Reactions   Suboxone [Buprenorphine Hcl-Naloxone Hcl] Nausea And Vomiting   Tape Other (See Comments)    [place] *Paper Tape is ok    Codeine Rash, Itching and Nausea Only   Morphine And Related Itching and Rash   Other Rash    Telemetry electrodes   Silicone Rash    [place] -*Paper Tape is ok      REVIEW OF SYSTEMS (Negative unless checked)  Constitutional: '[]'$ Weight loss  '[]'$ Fever  '[]'$ Chills Cardiac: '[]'$ Chest pain   '[]'$ Chest pressure   '[]'$ Palpitations   '[]'$ Shortness of breath when laying flat   '[]'$ Shortness of breath with exertion. Vascular:  '[x]'$ Pain in legs with walking   '[]'$ Pain in legs at rest  '[]'$ History of DVT   '[]'$ Phlebitis   '[]'$ Swelling in legs   '[]'$ Varicose veins   '[]'$ Non-healing ulcers Pulmonary:   '[]'$ Uses home oxygen   '[]'$ Productive cough   '[]'$ Hemoptysis   '[]'$ Wheeze  '[]'$ COPD   '[]'$ Asthma Neurologic:  '[]'$ Dizziness   '[]'$ Seizures   '[]'$ History of stroke   '[]'$ History of TIA   '[]'$ Aphasia   '[]'$ Vissual changes   '[]'$ Weakness or numbness in arm   '[]'$ Weakness or numbness in leg Musculoskeletal:   '[]'$ Joint swelling   '[]'$ Joint pain   '[]'$ Low back pain Hematologic:  '[]'$ Easy bruising  '[]'$ Easy bleeding   '[]'$ Hypercoagulable state   '[]'$ Anemic Gastrointestinal:  '[]'$ Diarrhea   '[]'$ Vomiting  '[]'$ Gastroesophageal reflux/heartburn   '[]'$ Difficulty swallowing. Genitourinary:  '[]'$ Chronic kidney disease   '[]'$ Difficult urination  '[]'$ Frequent urination   '[]'$   Blood in urine Skin:  '[]'$ Rashes   '[]'$ Ulcers  Psychological:  '[]'$ History of anxiety   '[]'$  History of major depression.  Physical Examination  There were no vitals filed for this visit. There is no height or weight on file to calculate BMI. Gen: WD/WN, NAD Head: [place]/AT, No temporalis wasting.  Ear/Nose/Throat: Hearing grossly intact, nares w/o erythema or drainage Eyes: PER, EOMI, sclera nonicteric.  Neck: Supple, no masses.  No bruit or JVD.  Pulmonary:  Good air movement, no audible wheezing, no use of accessory muscles.  Cardiac: RRR, normal S1, S2, no Murmurs. Vascular:  mild trophic changes, no open wounds Vessel Right Left  Radial Palpable Palpable  PT Not Palpable Not Palpable  DP Not Palpable Not Palpable  Gastrointestinal: soft, non-distended. No guarding/no peritoneal signs.  Musculoskeletal: M/S 5/5 throughout.  No visible deformity.  Neurologic: CN 2-12 intact. Pain and light touch intact in extremities.  Symmetrical.  Speech is fluent. Motor exam as listed above. Psychiatric: Judgment intact, Mood & affect appropriate for pt's clinical situation. Dermatologic: No rashes or ulcers noted.  No changes consistent with cellulitis.   CBC Lab Results  Component Value Date   WBC 5.7 10/22/2021   HGB 11.8 (L) 10/22/2021   HCT 36.3 (L) 10/22/2021   MCV 83.8 10/22/2021   PLT 197 10/22/2021    BMET    Component Value Date/Time   NA 138 10/22/2021 1313   NA 138 11/19/2020 0000   NA 142 05/23/2013 0329   K 4.2 10/22/2021 1313   K 3.8  05/23/2013 0329   CL 101 10/22/2021 1313   CL 109 (H) 05/23/2013 0329   CO2 28 10/22/2021 1313   CO2 27 05/23/2013 0329   GLUCOSE 144 (H) 10/22/2021 1313   GLUCOSE 151 (H) 05/23/2013 0329   BUN 23 10/22/2021 1313   BUN 19 11/19/2020 0000   BUN 5 (L) 05/23/2013 0329   CREATININE 1.07 10/22/2021 1313   CREATININE 0.98 05/27/2013 0301   CALCIUM 8.9 10/22/2021 1313   CALCIUM 8.5 05/23/2013 0329   GFRNONAA >60 10/22/2021 1313   GFRNONAA >60 05/27/2013 0301   GFRAA >60 12/04/2016 0052   GFRAA >60 05/27/2013 0301   CrCl cannot be calculated (Patient's most recent lab result is older than the maximum 21 days allowed.).  COAG Lab Results  Component Value Date   INR 0.95 10/17/2016   INR 1.04 07/15/2016   INR 1.0 04/30/2007    Radiology No results found.   Assessment/Plan 1. Atherosclerosis of native artery of both lower extremities with intermittent claudication (HCC) Recommend:   The patient has experienced increased claudication symptoms and is now describing lifestyle limiting claudication and appears to be having mild rest pain symptroms.   Given the severity of the patient's severe left lower extremity symptoms the patient should undergo angiography with the hope for intervention.  Risk and benefits were reviewed the patient.  Indications for the procedure were reviewed.  All questions were answered, the patient agrees to proceed with left lower extremity angiography and possible intervention.    The patient should continue walking and begin a more formal exercise program.  The patient should continue antiplatelet therapy and aggressive treatment of the lipid abnormalities   The patient will follow up with me after the angiogram.     2. Neuropathy The patient has profound neuropathy and he notes that has been worsening bilaterally.  We will refer the patient to neurology for further treatment and evaluation of his longstanding neuropathy. - Ambulatory referral  to  Neurology   3. Type 2 diabetes mellitus with diabetic polyneuropathy, without long-term current use of insulin (HCC) Continue hypoglycemic medications as already ordered, these medications have been reviewed and there are no changes at this time.   Hgb A1C to be monitored as already arranged by primary service      [name], MD  12/14/2021 8:28 AM

## 2021-12-14 NOTE — Op Note (Signed)
[facility]  Percutaneous Study/Intervention Procedural Note   Date of Surgery: 12/14/2021  Surgeon:  Gregory G. Schnier, MD.  Pre-operative Diagnosis: Atherosclerotic occlusive disease of the left lower extremity with rest pain; occlusion previously placed popliteal stent  Post-operative diagnosis:  Same  Procedure(s) Performed:             1.  Introduction catheter into left lower extremity 3rd order catheter placement  Contrast injection left lower extremity for distal runoff             2.   Rota Rex mechanical thrombectomy left popliteal artery             3.  Percutaneous transluminal angioplasty and stent placement left popliteal artery             4.  Star close closure right common femoral arteriotomy  Anesthesia: General  Sheath: 7 French 90 cm Rabie right common femoral retrograde  Contrast: 65 cc  Fluoroscopy Time: 10.7 minutes  Indications:  Erik Martinez presents with increasing pain in his left lower extremity.  Noninvasive studies as well as physical examination suggest occlusion of the previously placed popliteal stent.  This places him at increased risk for limb loss.  The risks and benefits are reviewed all questions answered patient agrees to proceed angiography with the hope for intervention for distal revascularization.  Procedure:  Erik Martinez is a 66 y.o. y.o. male who was identified and appropriate procedural time out was performed.  The patient was then placed supine on the table and prepped and draped in the usual sterile fashion.    Ultrasound was placed in the sterile sleeve and the right groin was evaluated the right common femoral artery was echolucent and pulsatile indicating patency.  Image was recorded for the permanent record and under real-time visualization a microneedle was inserted into the common femoral artery microwire followed by a micro-sheath.  A J-wire was then advanced through the micro-sheath and a  5 French  sheath was then inserted over a J-wire. J-wire was then advanced and a 5 French pigtail catheter was positioned at the level of T12. AP projection of the aorta was then obtained. Pigtail catheter was repositioned to above the bifurcation and a RAO view of the pelvis was obtained.  Subsequently a pigtail catheter with the stiff angle Glidewire was used to cross the aortic bifurcation the catheter wire were advanced down into the left distal external iliac artery. Oblique view of the femoral bifurcation was then obtained and subsequently the wire was reintroduced and the pigtail catheter negotiated into the SFA representing third order catheter placement. Distal runoff was then performed.  5000 units of heparin was then given and allowed to circulate and a 7 French 90 cm Rabie sheath was advanced up and over the bifurcation and positioned in the distal superficial femoral artery.  KMP  catheter and advantage Glidewire were then negotiated down into the distal popliteal.  Hand-injection of contrast verified intraluminal positioning.  A V18 wire was then reintroduced and a Rota Rex thrombectomy catheter was prepped on the field advanced over the wire and mechanical thrombectomy was performed across the entire close occluded portion of the popliteal artery.  Follow-up imaging demonstrated successful recanalization but there was still greater than 60% residual stenosis diffusely throughout the stent.  The length of this lesion measured approximately 70 mm.  A 7 mm x 75 mm Viabahn stent was then deployed across the lesion.  A 5 mm x   60 mm Lutonix drug-eluting balloon was used to angioplasty the distal portion of the stent and popliteal artery. Inflations were to 10 atmospheres for 1 minute.  Next a 6 mm x 60 mm Lutonix drug-eluting balloon was used to treat the proximal portion of the stent inflation was to 10 to 12 atm for 1 minute.. Distal runoff was then reassessed and found to be unchanged.  After review of these  images the sheath is pulled into the right external iliac oblique of the common femoral is obtained and a Star close device deployed. There no immediate complications.   Findings:  The abdominal aorta is opacified with a bolus injection contrast. Renal arteries are single and widely patent. The aorta itself has diffuse disease but no hemodynamically significant lesions. The common and external iliac arteries are widely patent bilaterally.  The left common femoral is widely patent as is the profunda femoris.  The SFA is widely patent.  The mid to distal popliteal demonstrates occlusion previously-placed life stent is noted.  There is reconstitution of the last 10 to 15 mm of the popliteal artery which appears to be patent without hemodynamically significant stenosis.  The trifurcation is patent and there appears to be three-vessel runoff to the foot.  Following [place] Rex mechanical thrombectomy there is successful reestablishment of a channel through the occlusion. Following angioplasty and stent placement across the popliteal there is now wide patency with flow into the trifurcation and three-vessel runoff is preserved.     Summary: Successful recanalization left lower extremity for limb salvage                        Disposition: Patient was taken to the recovery room in stable condition having tolerated the procedure well.  Erik Martinez, Erik Martinez 12/14/2021,2:15 PM

## 2021-12-14 NOTE — Discharge Instructions (Signed)

## 2021-12-14 NOTE — Anesthesia Preprocedure Evaluation (Signed)
Anesthesia Evaluation  Patient identified by MRN, date of birth, ID band Patient awake    Reviewed: Allergy & Precautions, NPO status , Patient's Chart, lab work & pertinent test results  History of Anesthesia Complications (+) PONV, POST - OP SPINAL HEADACHE and history of anesthetic complications  Airway Mallampati: III  TM Distance: >3 FB Neck ROM: Full    Dental  (+) Upper Dentures, Lower Dentures   Pulmonary neg pulmonary ROS, neg shortness of breath, neg sleep apnea, neg recent URI, Patient abstained from smoking.Not current smoker   Pulmonary exam normal breath sounds clear to auscultation       Cardiovascular Exercise Tolerance: Good METS(-) hypertension(-) angina + Peripheral Vascular Disease  (-) CAD and (-) Past MI (-) dysrhythmias  Rhythm:Regular Rate:Tachycardia + Systolic murmurs    Neuro/Psych  Headaches PSYCHIATRIC DISORDERS Anxiety Depression Bipolar Disorder   neuropathy  Neuromuscular disease    GI/Hepatic ,GERD  Controlled,,(+)     (-) substance abuse  Tx for hep c   Endo/Other  diabetes, Well Controlled, Type 2  Ozempic, 8/28  Renal/GU Renal InsufficiencyRenal disease     Musculoskeletal  (+) Arthritis ,  On chronic opioids, about '70mg'$  daily oxycodone   Abdominal   Peds  Hematology   Anesthesia Other Findings Pt suspected to have a septic joint.  Past Medical History: No date: Anxiety No date: Arthritis No date: Avascular necrosis of bones of both hips (HCC) No date: Bipolar disorder (HCC) No date: Bladder spasms No date: Chronic pain of left knee No date: Chronic pain syndrome     Comment:  a.) followed by pain clinic 09/09/2015: Cocaine use disorder, moderate, dependence (HCC) No date: DDD (degenerative disc disease) No date: Depression No date: Erectile dysfunction     Comment:  a.) on PDE5i (sildenafil) No date: Frequency-urgency syndrome No date: GERD (gastroesophageal reflux  disease) No date: History of hepatitis B     Comment:  1983  TX'D--  NO ISSUES OR SYMPTOMS SINCE No date: Hypotestosteronemia 09/08/2015: 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[place]) No date: Nocturia No date: Polysubstance abuse ([place])     Comment:  a.) cocaine + marijuana + BZO + opioids No date: PONV (postoperative nausea and vomiting)     Comment:  "difficult to put me to sleep" No date: Spinal headache No date: T2DM (type 2 diabetes mellitus) ([place]) No date: Urine incontinence  Reproductive/Obstetrics                             Anesthesia Physical Anesthesia Plan  ASA: 3  Anesthesia Plan: General   Post-op Pain Management: Ofirmev IV (intra-op)* and Toradol IV (intra-op)*   Induction: Intravenous and Rapid sequence  PONV Risk Score and Plan: 4 or greater and Ondansetron, Dexamethasone, Midazolam and Treatment may vary due to age or medical condition  Airway Management Planned: Oral ETT and LMA  Additional Equipment: None  Intra-op Plan:   Post-operative Plan: Extubation in OR  Informed Consent: I have reviewed the patients History and Physical, chart, labs and discussed the procedure including the risks, benefits and alternatives for the proposed anesthesia with the patient or authorized representative who has indicated his/her understanding and acceptance.     Dental advisory given  Plan Discussed with: CRNA and Surgeon  Anesthesia Plan Comments: (Discussed risks of anesthesia with patient, including PONV, sore throat, lip/dental/eye damage. Rare risks discussed as well, such as cardiorespiratory and neurological sequelae, and allergic reactions. Discussed the role of CRNA in patient's  perioperative care. Patient understands.)        Anesthesia Quick Evaluation

## 2021-12-14 NOTE — Anesthesia Postprocedure Evaluation (Signed)
Anesthesia Post Note  Patient: Erik Martinez  Procedure(s) Performed: Lower Extremity Angiography (Left: Leg Lower)  Patient location during evaluation: PACU Anesthesia Type: General Level of consciousness: awake and alert Pain management: pain level controlled Vital Signs Assessment: post-procedure vital signs reviewed and stable Respiratory status: spontaneous breathing, nonlabored ventilation, respiratory function stable and patient connected to nasal cannula oxygen Cardiovascular status: blood pressure returned to baseline and stable Postop Assessment: no apparent nausea or vomiting Anesthetic complications: no  No notable events documented.   Last Vitals:  Vitals:   12/14/21 1415 12/14/21 1430  BP: 130/74 122/78  Pulse: 82 81  Resp: 14 13  Temp:    SpO2: 96% 93%    Last Pain:  Vitals:   12/14/21 1430  TempSrc:   PainSc: 0-No pain                 Dimas Millin

## 2021-12-14 NOTE — Interval H&P Note (Signed)
History and Physical Interval Note:  12/14/2021 1:54 PM  Erik Martinez  has presented today for surgery, with the diagnosis of LLE Angio   ANESTHESIA    ASO w rest pain.  The various methods of treatment have been discussed with the patient and family. After consideration of risks, benefits and other options for treatment, the patient has consented to  Procedure(s): Lower Extremity Angiography (Left) as a surgical intervention.  The patient's history has been reviewed, patient examined, no change in status, stable for surgery.  I have reviewed the patient's chart and labs.  Questions were answered to the patient's satisfaction.     [name]

## 2021-12-14 NOTE — Anesthesia Procedure Notes (Signed)
Procedure Name: Intubation Date/Time: 12/14/2021 12:41 PM  Performed by: Kelton Pillar, CRNAPre-anesthesia Checklist: Patient identified, Emergency Drugs available, Suction available and Patient being monitored Patient Re-evaluated:Patient Re-evaluated prior to induction Oxygen Delivery Method: Circle system utilized Preoxygenation: Pre-oxygenation with 100% oxygen Induction Type: IV induction Ventilation: Mask ventilation without difficulty Laryngoscope Size: McGraph and 3 Grade View: Grade I Tube type: Oral Tube size: 7.0 mm Number of attempts: 1 Airway Equipment and Method: Stylet and Oral airway Placement Confirmation: ETT inserted through vocal cords under direct vision, positive ETCO2, breath sounds checked- equal and bilateral and CO2 detector Secured at: 21 cm Tube secured with: Tape Dental Injury: Teeth and Oropharynx as per pre-operative assessment

## 2021-12-14 NOTE — Progress Notes (Signed)
MRN : [id number]  Erik Martinez is a 66 y.o. ([birth year]) male who presents with chief complaint of check circulation.  History of Present Illness:  Patient presents to [facility] for angiography and intervention of his left lower extremity  The patient notes that there has been a significant deterioration in the lower extremity symptoms.  The patient notes interval shortening of their claudication distance and development of mild rest pain symptoms. No new ulcers or wounds have occurred since the last visit.  The patient notes that his claudication has been much worse following his recent revision of his left knee.  The patient also has notable neuropathy.   There have been no significant changes to the patient's overall health care.   The patient denies amaurosis fugax or recent TIA symptoms. There are no recent neurological changes noted. There is no history of DVT, PE or superficial thrombophlebitis. The patient denies recent episodes of angina or shortness of breath.    The patient has noncompressible ABIs bilaterally with a TBI of 1.04 on the right and 0.54 on the left.  Additional lower extremity arterial duplex shows an occluded left popliteal artery stenosis.    No outpatient medications have been marked as taking for the 12/14/21 encounter [facility] Encounter).    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   Nocturia    Polysubstance abuse (HCC)    a.) cocaine + marijuana + BZO + opioids   PONV (postoperative nausea and vomiting)    "difficult to put me to sleep"   Spinal headache    T2DM (type 2 diabetes mellitus) ([place])    Urine incontinence     Past Surgical History:  Procedure Laterality Date   APPLICATION OF WOUND VAC Left 09/11/2021   Procedure: APPLICATION OF WOUND VAC;  Surgeon: [name], MD;  Location: [facility] ORS;  Service: Orthopedics;  Laterality: Left;  [id number]    BACK SURGERY     2 rods and 4 screws artificial disc   INTERSTIM IMPLANT PLACEMENT  2007   INTERSTIM IMPLANT REVISION N/A 08/17/2012   Procedure: REPLACMENT OF IPG PLUS REPLACE LEAD OF INTERSTIM IMPLANT ;  Surgeon: [name], MD;  Location: [place];  Service: Urology;  Laterality: N/A;   IRRIGATION AND DEBRIDEMENT KNEE Left 09/11/2021   Procedure: IRRIGATION AND DEBRIDEMENT WITH POLY EXCHANGE LEFT KNEE;  Surgeon: [name], MD;  Location: [facility] ORS;  Service: Orthopedics;  Laterality: Left;   JOINT REPLACEMENT     Lt knee and Lt hip   LOWER EXTREMITY ANGIOGRAPHY Left 11/17/2020   Procedure: LOWER EXTREMITY ANGIOGRAPHY;  Surgeon: Katha Cabal, MD;  Location: [place] [facility];  Service: Cardiovascular;  Laterality: Left;   LOWER EXTREMITY ANGIOGRAPHY Right 12/22/2020   Procedure: LOWER EXTREMITY ANGIOGRAPHY;  Surgeon: Katha Cabal, MD;  Location: [place] [facility];  Service: Cardiovascular;  Laterality: Right;   LUMBAR DISC SURGERY  2005   L5   LUMBAR FUSION  X2  2006  &  2007   OPEN DEBRIDEMENT LEFT TOTAL KNEE (SCAR, BONEY GRAOWTH)/ REMOVAL OLD SUTURES  05-01-2007   PARTIAL HIP ARTHROPLASTY Left 2001   x2   SHOULDER OPEN ROTATOR CUFF REPAIR Left 2000   TOTAL HIP ARTHROPLASTY Left 2002   TOTAL KNEE ARTHROPLASTY Left    PARTIAL LEFT KNEE REPLACEMENT PRIOR TO THIS   TOTAL KNEE REVISION Left 2008   TOTAL KNEE REVISION Left  07/19/2021   Procedure: TOTAL KNEE REVISION;  Surgeon: [name], MD;  Location: [facility];  Service: Orthopedics;  Laterality: Left;   TRIGGER FINGER RELEASE Bilateral    SEVERAL FINGERS   TRIGGER FINGER RELEASE Right 06/18/2014   Procedure: RELEASE TRIGGER FINGER/A-1 PULLEY;  Surgeon: [name], MD;  Location: [facility];  Service: Orthopedics;  Laterality: Right;    Social History Social History   Tobacco Use   Smoking status: Never   Smokeless tobacco: Never  Vaping Use   Vaping Use: Never used  Substance Use Topics   Alcohol use: Not Currently    Alcohol/week: 0.0 - 2.0 standard drinks of alcohol   Drug use: Not Currently    Types: Cocaine    Comment: pt states its been years since he used cocaine 6 yrs ago    Family History Family History  Problem Relation Age of Onset   Diabetes Mother    Arthritis Mother    Cancer Mother        lung cancer   Hyperlipidemia Mother    Stroke Mother    Alcohol abuse Father    Cancer Father        lung   Arthritis Father    Diabetes Brother     Allergies  Allergen Reactions   Suboxone [Buprenorphine Hcl-Naloxone Hcl] Nausea And Vomiting   Tape Other (See Comments)    Whelps *Paper Tape is ok    Codeine Rash, Itching and Nausea Only   Morphine And Related Itching and Rash   Other Rash    Telemetry electrodes   Silicone Rash    Whelps -*Paper Tape is ok      REVIEW OF SYSTEMS (Negative unless checked)  Constitutional: '[]'$ Weight loss  '[]'$ Fever  '[]'$ Chills Cardiac: '[]'$ Chest pain   '[]'$ Chest pressure   '[]'$ Palpitations   '[]'$ Shortness of breath when laying flat   '[]'$ Shortness of breath with exertion. Vascular:  '[x]'$ Pain in legs with walking   '[]'$ Pain in legs at rest  '[]'$ History of DVT   '[]'$ Phlebitis   '[]'$ Swelling in legs   '[]'$ Varicose veins   '[]'$ Non-healing ulcers Pulmonary:   '[]'$ Uses home oxygen   '[]'$ Productive cough   '[]'$ Hemoptysis   '[]'$ Wheeze  '[]'$ COPD   '[]'$ Asthma Neurologic:  '[]'$ Dizziness   '[]'$ Seizures   '[]'$ History of stroke   '[]'$ History of TIA   '[]'$ Aphasia   '[]'$ Vissual changes   '[]'$ Weakness or numbness in arm   '[]'$ Weakness or numbness in leg Musculoskeletal:   '[]'$ Joint swelling   '[]'$ Joint pain   '[]'$ Low back pain Hematologic:  '[]'$ Easy bruising  '[]'$ Easy bleeding   '[]'$ Hypercoagulable state   '[]'$ Anemic Gastrointestinal:  '[]'$ Diarrhea   '[]'$ Vomiting  '[]'$ Gastroesophageal reflux/heartburn   '[]'$ Difficulty swallowing. Genitourinary:  '[]'$ Chronic kidney disease   '[]'$ Difficult urination  '[]'$ Frequent urination   '[]'$   Blood in urine Skin:  '[]'$ Rashes   '[]'$ Ulcers  Psychological:  '[]'$ History of anxiety   '[]'$  History of major depression.  Physical Examination  There were no vitals filed for this visit. There is no height or weight on file to calculate BMI. Gen: WD/WN, NAD Head: [employer]/AT, No temporalis wasting.  Ear/Nose/Throat: Hearing grossly intact, nares w/o erythema or drainage Eyes: PER, EOMI, sclera nonicteric.  Neck: Supple, no masses.  No bruit or JVD.  Pulmonary:  Good air movement, no audible wheezing, no use of accessory muscles.  Cardiac: RRR, normal S1, S2, no Murmurs. Vascular:  mild trophic changes, no open wounds Vessel Right Left  Radial Palpable Palpable  PT Not Palpable Not Palpable  DP Not Palpable Not Palpable  Gastrointestinal: soft, non-distended. No guarding/no peritoneal signs.  Musculoskeletal: M/S 5/5 throughout.  No visible deformity.  Neurologic: CN 2-12 intact. Pain and light touch intact in extremities.  Symmetrical.  Speech is fluent. Motor exam as listed above. Psychiatric: Judgment intact, Mood & affect appropriate for pt's clinical situation. Dermatologic: No rashes or ulcers noted.  No changes consistent with cellulitis.   CBC Lab Results  Component Value Date   WBC 5.7 10/22/2021   HGB 11.8 (L) 10/22/2021   HCT 36.3 (L) 10/22/2021   MCV 83.8 10/22/2021   PLT 197 10/22/2021    BMET    Component Value Date/Time   NA 138 10/22/2021 1313   NA 138 11/19/2020 0000   NA 142 05/23/2013 0329   K 4.2 10/22/2021 1313   K 3.8  05/23/2013 0329   CL 101 10/22/2021 1313   CL 109 (H) 05/23/2013 0329   CO2 28 10/22/2021 1313   CO2 27 05/23/2013 0329   GLUCOSE 144 (H) 10/22/2021 1313   GLUCOSE 151 (H) 05/23/2013 0329   BUN 23 10/22/2021 1313   BUN 19 11/19/2020 0000   BUN 5 (L) 05/23/2013 0329   CREATININE 1.07 10/22/2021 1313   CREATININE 0.98 05/27/2013 0301   CALCIUM 8.9 10/22/2021 1313   CALCIUM 8.5 05/23/2013 0329   GFRNONAA >60 10/22/2021 1313   GFRNONAA >60 05/27/2013 0301   GFRAA >60 12/04/2016 0052   GFRAA >60 05/27/2013 0301   CrCl cannot be calculated (Patient's most recent lab result is older than the maximum 21 days allowed.).  COAG Lab Results  Component Value Date   INR 0.95 10/17/2016   INR 1.04 07/15/2016   INR 1.0 04/30/2007    Radiology No results found.   Assessment/Plan 1. Atherosclerosis of native artery of both lower extremities with intermittent claudication (HCC) Recommend:   The patient has experienced increased claudication symptoms and is now describing lifestyle limiting claudication and appears to be having mild rest pain symptroms.   Given the severity of the patient's severe left lower extremity symptoms the patient should undergo angiography with the hope for intervention.  Risk and benefits were reviewed the patient.  Indications for the procedure were reviewed.  All questions were answered, the patient agrees to proceed with left lower extremity angiography and possible intervention.    The patient should continue walking and begin a more formal exercise program.  The patient should continue antiplatelet therapy and aggressive treatment of the lipid abnormalities   The patient will follow up with me after the angiogram.     2. Neuropathy The patient has profound neuropathy and he notes that has been worsening bilaterally.  We will refer the patient to neurology for further treatment and evaluation of his longstanding neuropathy. - Ambulatory referral  to  Neurology   3. Type 2 diabetes mellitus with diabetic polyneuropathy, without long-term current use of insulin (HCC) Continue hypoglycemic medications as already ordered, these medications have been reviewed and there are no changes at this time.   Hgb A1C to be monitored as already arranged by primary service      [name], MD  12/14/2021 8:28 AM

## 2021-12-15 LAB — GLUCOSE, CAPILLARY: Glucose-Capillary: 137 mg/dL — ABNORMAL HIGH (ref 70–99)

## 2021-12-20 ENCOUNTER — Encounter: Payer: Self-pay | Admitting: Vascular Surgery

## 2021-12-23 DIAGNOSIS — Z96652 Presence of left artificial knee joint: Secondary | ICD-10-CM | POA: Diagnosis not present

## 2021-12-27 DIAGNOSIS — E291 Testicular hypofunction: Secondary | ICD-10-CM | POA: Diagnosis not present

## 2021-12-27 DIAGNOSIS — K429 Umbilical hernia without obstruction or gangrene: Secondary | ICD-10-CM | POA: Diagnosis not present

## 2021-12-27 DIAGNOSIS — E1165 Type 2 diabetes mellitus with hyperglycemia: Secondary | ICD-10-CM | POA: Diagnosis not present

## 2021-12-27 DIAGNOSIS — E7800 Pure hypercholesterolemia, unspecified: Secondary | ICD-10-CM | POA: Diagnosis not present

## 2021-12-27 DIAGNOSIS — N529 Male erectile dysfunction, unspecified: Secondary | ICD-10-CM | POA: Diagnosis not present

## 2022-01-06 ENCOUNTER — Other Ambulatory Visit (INDEPENDENT_AMBULATORY_CARE_PROVIDER_SITE_OTHER): Payer: Self-pay | Admitting: Vascular Surgery

## 2022-01-06 DIAGNOSIS — Z79891 Long term (current) use of opiate analgesic: Secondary | ICD-10-CM | POA: Diagnosis not present

## 2022-01-06 DIAGNOSIS — G894 Chronic pain syndrome: Secondary | ICD-10-CM | POA: Diagnosis not present

## 2022-01-06 DIAGNOSIS — I739 Peripheral vascular disease, unspecified: Secondary | ICD-10-CM

## 2022-01-06 DIAGNOSIS — Z95820 Peripheral vascular angioplasty status with implants and grafts: Secondary | ICD-10-CM

## 2022-01-11 ENCOUNTER — Other Ambulatory Visit (INDEPENDENT_AMBULATORY_CARE_PROVIDER_SITE_OTHER): Payer: Self-pay | Admitting: Nurse Practitioner

## 2022-01-11 ENCOUNTER — Encounter (INDEPENDENT_AMBULATORY_CARE_PROVIDER_SITE_OTHER): Payer: Self-pay | Admitting: Nurse Practitioner

## 2022-01-11 ENCOUNTER — Ambulatory Visit (INDEPENDENT_AMBULATORY_CARE_PROVIDER_SITE_OTHER): Payer: PPO

## 2022-01-11 ENCOUNTER — Ambulatory Visit (INDEPENDENT_AMBULATORY_CARE_PROVIDER_SITE_OTHER): Payer: PPO | Admitting: Nurse Practitioner

## 2022-01-11 VITALS — BP 146/88 | HR 105 | Resp 16 | Wt 182.4 lb

## 2022-01-11 DIAGNOSIS — E1142 Type 2 diabetes mellitus with diabetic polyneuropathy: Secondary | ICD-10-CM

## 2022-01-11 DIAGNOSIS — Z95820 Peripheral vascular angioplasty status with implants and grafts: Secondary | ICD-10-CM

## 2022-01-11 DIAGNOSIS — I739 Peripheral vascular disease, unspecified: Secondary | ICD-10-CM

## 2022-01-11 DIAGNOSIS — M7989 Other specified soft tissue disorders: Secondary | ICD-10-CM | POA: Diagnosis not present

## 2022-01-11 DIAGNOSIS — M25462 Effusion, left knee: Secondary | ICD-10-CM | POA: Diagnosis not present

## 2022-01-11 DIAGNOSIS — M25562 Pain in left knee: Secondary | ICD-10-CM

## 2022-01-11 NOTE — Progress Notes (Signed)
Subjective:    Patient ID: Erik Martinez, male    DOB: 1955-11-12, 67 y.o.   MRN: [id number] Chief Complaint  Patient presents with   Follow-up    [facility] 3 week follow up    The patient returns to the office for followup and review status post angiogram with intervention on 12/14/2021.   Procedure: Procedure(s) Performed:             1.  Introduction catheter into left lower extremity 3rd order catheter placement  Contrast injection left lower extremity for distal runoff             2.   [place] mechanical thrombectomy left popliteal artery             3.  Percutaneous transluminal angioplasty and stent placement left popliteal artery             4.  Star close closure right common femoral arteriotomy   The patient notes improvement in the lower extremity symptoms. No interval shortening of the patient's claudication distance or rest pain symptoms. No new ulcers or wounds have occurred since the last visit.  Largest complaint has since his last visit is about his left knee.  He has been having worsening swelling and decreased range of motion over the last several weeks that is causing him significant discomfort and pain.  He notes that the swelling is largely concentrated in the knee itself.  He notes that it does not radiate much down his leg much above the knee itself.  This swelling has been ongoing since his most recent knee surgery.  No documented history of amaurosis fugax or recent TIA symptoms. There are no recent neurological changes noted. No documented history of DVT, PE or superficial thrombophlebitis. The patient denies recent episodes of angina or shortness of breath.   ABIs are noncompressible bilaterally with a TBI 0.99 on the right and 0.78 on the left.  Previous ABIs were also noncompressible however there was a TBI of 1.04 on the right and 0.54 on the left on 10/29/2021.  Additional duplex of the left lower extremity shows that the left popliteal artery is patent with  no evidence of restenosis.    Review of Systems  Cardiovascular:  Positive for leg swelling.  Musculoskeletal:  Positive for joint swelling.  All other systems reviewed and are negative.      Objective:   Physical Exam Vitals reviewed.  HENT:     Head: Normocephalic.  Cardiovascular:     Rate and Rhythm: Normal rate.     Pulses:          Dorsalis pedis pulses are detected w/ Doppler on the right side and detected w/ Doppler on the left side.       Posterior tibial pulses are detected w/ Doppler on the right side and detected w/ Doppler on the left side.  Pulmonary:     Effort: Pulmonary effort is normal.  Musculoskeletal:     Left lower leg: Edema present.  Skin:    General: Skin is warm and dry.  Neurological:     Mental Status: He is alert and oriented to person, place, and time.  Psychiatric:        Mood and Affect: Mood normal.        Behavior: Behavior normal.        Thought Content: Thought content normal.        Judgment: Judgment normal.     BP (!) 146/88 (BP  Location: Left Arm)   Pulse (!) 105   Resp 16   Wt 182 lb 6.4 oz (82.7 kg)   BMI 26.17 kg/m   Past Medical History:  Diagnosis Date   Anxiety    Arthritis    Avascular necrosis of bones of both hips (HCC)    Bipolar disorder (HCC)    Bladder spasms    Chronic pain of left knee    Chronic pain syndrome    a.) followed by pain clinic   Cocaine use disorder, moderate, dependence ([place]) 09/09/2015   DDD (degenerative disc disease)    Depression    Erectile dysfunction    a.) on PDE5i (sildenafil)   Frequency-urgency syndrome    GERD (gastroesophageal reflux disease)    History of hepatitis B    1983  TX'D--  NO ISSUES OR SYMPTOMS SINCE   Hypotestosteronemia    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   Nocturia    Polysubstance abuse (HCC)    a.) cocaine + marijuana + BZO + opioids   PONV (postoperative nausea and vomiting)    "difficult to put me to sleep"   Spinal headache    T2DM (type 2  diabetes mellitus) ([place])    Urine incontinence     Social History   Socioeconomic History   Marital status: Divorced    Spouse name: Not on file   Number of children: 3   Years of education: Not on file   Highest education level: Some college, no degree  Occupational History   Not on file  Tobacco Use   Smoking status: Never   Smokeless tobacco: Never  Vaping Use   Vaping Use: Never used  Substance and Sexual Activity   Alcohol use: Not Currently    Alcohol/week: 0.0 - 2.0 standard drinks of alcohol   Drug use: Not Currently    Types: Cocaine    Comment: pt states its been years since he used cocaine 6 yrs ago   Sexual activity: Not Currently  Other Topics Concern   Not on file  Social History Narrative    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, partime   Social Determinants of Health   Financial Resource Strain: [place]  (06/08/2021)   Overall Financial Resource Strain (CARDIA)    Difficulty of Paying Living Expenses: Not very hard  Food Insecurity: No Food Insecurity (06/08/2021)   Hunger Vital Sign    Worried About Running Out of Food in the Last Year: Never true    Ran Out of Food in the Last Year: Never true  Transportation Needs: Not on file  Physical Activity: Not on file  Stress: Not on file  Social Connections: Not on file  Intimate Partner Violence: Not on file    Past Surgical History:  Procedure Laterality Date   APPLICATION OF WOUND VAC Left 09/11/2021   Procedure: APPLICATION OF WOUND VAC;  Surgeon: [name], MD;  Location: [facility];  Service: Orthopedics;  Laterality: Left;  [id number]    BACK SURGERY     2 rods and 4 screws artificial disc   INTERSTIM IMPLANT PLACEMENT  2007   INTERSTIM IMPLANT REVISION N/A 08/17/2012   Procedure: REPLACMENT OF IPG PLUS REPLACE LEAD OF INTERSTIM IMPLANT ;  Surgeon: [name], MD;  Location: [place];  Service: Urology;  Laterality: N/A;   IRRIGATION AND DEBRIDEMENT KNEE Left 09/11/2021   Procedure:  IRRIGATION AND DEBRIDEMENT WITH POLY EXCHANGE LEFT KNEE;  Surgeon: [name], MD;  Location: [facility];  Service: Orthopedics;  Laterality: Left;   JOINT REPLACEMENT     Lt knee and Lt hip   LOWER EXTREMITY ANGIOGRAPHY Left 11/17/2020   Procedure: LOWER EXTREMITY ANGIOGRAPHY;  Surgeon: Katha Cabal, MD;  Location: [place] [facility];  Service: Cardiovascular;  Laterality: Left;   LOWER EXTREMITY ANGIOGRAPHY Right 12/22/2020   Procedure: LOWER EXTREMITY ANGIOGRAPHY;  Surgeon: Katha Cabal, MD;  Location: [place] [facility];  Service: Cardiovascular;  Laterality: Right;   LOWER EXTREMITY ANGIOGRAPHY Left 12/14/2021   Procedure: Lower Extremity Angiography;  Surgeon: Katha Cabal, MD;  Location: [place] [facility];  Service: Cardiovascular;  Laterality: Left;   LUMBAR DISC SURGERY  2005   L5   LUMBAR FUSION  X2  2006  &  2007   OPEN DEBRIDEMENT LEFT TOTAL KNEE (SCAR, BONEY GRAOWTH)/ REMOVAL OLD SUTURES  05-01-2007   PARTIAL HIP ARTHROPLASTY Left 2001   x2   SHOULDER OPEN ROTATOR CUFF REPAIR Left 2000   TOTAL HIP ARTHROPLASTY Left 2002   TOTAL KNEE ARTHROPLASTY Left    PARTIAL LEFT KNEE REPLACEMENT PRIOR TO THIS   TOTAL KNEE REVISION Left 2008   TOTAL KNEE REVISION Left 07/19/2021   Procedure: TOTAL KNEE REVISION;  Surgeon: [name], MD;  Location: [facility];  Service: Orthopedics;  Laterality: Left;   TRIGGER FINGER RELEASE Bilateral    SEVERAL FINGERS   TRIGGER FINGER RELEASE Right 06/18/2014   Procedure: RELEASE TRIGGER FINGER/A-1 PULLEY;  Surgeon: [name], MD;  Location: [facility];  Service: Orthopedics;  Laterality: Right;    Family History  Problem Relation Age of Onset   Diabetes Mother    Arthritis Mother    Cancer Mother        lung cancer   Hyperlipidemia Mother    Stroke Mother    Alcohol abuse Father    Cancer Father        lung   Arthritis Father    Diabetes Brother     Allergies  Allergen Reactions   Suboxone [Buprenorphine  Hcl-Naloxone Hcl] Nausea And Vomiting   Tape Other (See Comments)    Whelps *Paper Tape is ok    Codeine Rash, Itching and Nausea Only   Morphine And Related Itching and Rash   Other Rash    Telemetry electrodes   Silicone Rash    Whelps -*Paper Tape is ok        Latest Ref Rng & Units 10/22/2021    1:13 PM 10/18/2021    1:49 PM 10/11/2021    1:13 PM  CBC  WBC 4.0 - 10.5 K/uL 5.7  6.5  5.6   Hemoglobin 13.0 - 17.0 g/dL 11.8  11.7  11.6   Hematocrit 39.0 - 52.0 % 36.3  36.8  37.3   Platelets 150 - 400 K/uL 197  224  212       CMP     Component Value Date/Time   NA 138 10/22/2021 1313   NA 138 11/19/2020 0000   NA 142 05/23/2013 0329   K 4.2 10/22/2021 1313   K 3.8 05/23/2013 0329   CL 101 10/22/2021 1313   CL 109 (H) 05/23/2013 0329   CO2 28 10/22/2021 1313   CO2 27 05/23/2013 0329   GLUCOSE 144 (H) 10/22/2021 1313   GLUCOSE 151 (H) 05/23/2013 0329   BUN 26 (H) 12/14/2021 1216   BUN 19 11/19/2020 0000   BUN 5 (L) 05/23/2013 0329   CREATININE 0.90 12/14/2021 1216  CREATININE 0.98 05/27/2013 0301   CALCIUM 8.9 10/22/2021 1313   CALCIUM 8.5 05/23/2013 0329   PROT 8.4 (H) 10/22/2021 1313   PROT 6.2 (L) 05/21/2013 0530   ALBUMIN 3.4 (L) 10/22/2021 1313   ALBUMIN 2.9 (L) 05/21/2013 0530   AST 19 10/22/2021 1313   AST 25 05/21/2013 0530   ALT 8 10/22/2021 1313   ALT 32 05/21/2013 0530   ALKPHOS 70 10/22/2021 1313   ALKPHOS 51 05/21/2013 0530   BILITOT 0.7 10/22/2021 1313   BILITOT 0.7 05/21/2013 0530   GFRNONAA >60 12/14/2021 1216   GFRNONAA >60 05/27/2013 0301   GFRAA >60 12/04/2016 0052   GFRAA >60 05/27/2013 0301     No results found.     Assessment & Plan:   1. PAD (peripheral artery disease) (HCC) Recommend:  The patient is status post successful angiogram with intervention.  The patient reports that the claudication symptoms and leg pain has improved.   The patient denies lifestyle limiting changes at this point in time.  No further invasive  studies, angiography or surgery at this time The patient should continue walking and begin a more formal exercise program.  The patient should continue antiplatelet therapy and aggressive treatment of the lipid abnormalities  Continued surveillance is indicated as atherosclerosis is likely to progress with time.    Patient should undergo noninvasive studies as ordered. The patient will follow up with me to review the studies.   Patient will follow-up in 3 months with noninvasive studies.  2. Type 2 diabetes mellitus with diabetic polyneuropathy, without long-term current use of insulin (HCC) Continue hypoglycemic medications as already ordered, these medications have been reviewed and there are no changes at this time.  Hgb A1C to be monitored as already arranged by primary service  3. Pain and swelling of knee, left Today the patient does have evidence of pain and swelling of his left knee.  Largely suspect this is related to his most recent knee intervention given that the swelling is located only in the knee itself.  The patient is advised to follow-up with his orthopedic surgeon.  However, out of an abundance of caution we will have the patient return with DVT studies to ensure there is no evidence or presence of DVT.   Current Outpatient Medications on File Prior to Visit  Medication Sig Dispense Refill   acetaminophen (TYLENOL) 500 MG tablet Take 1,500 mg by mouth 2 (two) times daily as needed for moderate pain or mild pain.     aspirin EC 81 MG tablet Take 1 tablet (81 mg total) by mouth daily. Swallow whole. 150 tablet 1   aspirin EC 81 MG tablet Take 81 mg by mouth daily. Swallow whole.     atorvastatin (LIPITOR) 20 MG tablet Take 20 mg by mouth at bedtime.     cephALEXin (KEFLEX) 500 MG capsule Take 1 capsule (500 mg total) by mouth 4 (four) times daily. 120 capsule 4   clopidogrel (PLAVIX) 75 MG tablet Take 1 tablet (75 mg total) by mouth daily. 30 tablet 5   cyclobenzaprine  (FLEXERIL) 10 MG tablet SMARTSIG:1 Tablet(s) By Mouth Every 12 Hours     empagliflozin (JARDIANCE) 25 MG TABS tablet Take 1 tablet (25 mg total) by mouth daily. 90 tablet 1   famotidine (PEPCID) 20 MG tablet Take 1 tablet (20 mg total) by mouth 2 (two) times daily. 15 tablet 0   ferrous sulfate 324 MG TBEC TAKE ONE TABLET BY MOUTH MONDAY,WEDNESDAY,FRIDAY  lidocaine (LIDODERM) 5 % Place 1 patch onto the skin daily. Remove & Discard patch within 12 hours or as directed by MD 30 patch 0   metFORMIN (GLUCOPHAGE) 1000 MG tablet Take 500 mg by mouth 2 (two) times daily.     oxyCODONE (ROXICODONE) 15 MG immediate release tablet Take 15 mg by mouth every 6 (six) hours.     OZEMPIC, 0.25 OR 0.5 MG/DOSE, 2 MG/3ML SOPN Inject 0.5 mg into the skin once a week.     pregabalin (LYRICA) 200 MG capsule Take 1 capsule (200 mg total) by mouth in the morning, at noon, and at bedtime. 90 capsule 0   pregabalin (LYRICA) 300 MG capsule Take 1 capsule (300 mg total) by mouth 2 (two) times daily. 60 capsule 0   rifampin (RIFADIN) 300 MG capsule Take 1 capsule (300 mg total) by mouth 2 (two) times daily. 60 capsule 4   terbinafine (LAMISIL) 250 MG tablet Take 1 tablet (250 mg total) by mouth daily. 90 tablet 0   zolpidem (AMBIEN) 10 MG tablet Take 1 tablet (10 mg total) by mouth at bedtime as needed. for sleep 30 tablet 2   diclofenac (VOLTAREN) 75 MG EC tablet Take 75 mg by mouth 2 (two) times daily. (Patient not taking: Reported on 12/14/2021)     Multiple Vitamin (MULTIVITAMIN WITH MINERALS) TABS tablet Take 1 tablet by mouth in the morning. (Patient not taking: Reported on 12/14/2021)     pantoprazole (PROTONIX) 40 MG tablet Take 1 tablet (40 mg total) by mouth daily. 90 tablet 3   No current facility-administered medications on file prior to visit.    There are no Patient Instructions on file for this visit. No follow-ups on file.   Kris Hartmann, NP

## 2022-01-12 DIAGNOSIS — Z96652 Presence of left artificial knee joint: Secondary | ICD-10-CM | POA: Diagnosis not present

## 2022-01-14 ENCOUNTER — Telehealth: Payer: Self-pay | Admitting: Family Medicine

## 2022-01-14 ENCOUNTER — Other Ambulatory Visit: Payer: Self-pay

## 2022-01-14 DIAGNOSIS — G890 Central pain syndrome: Secondary | ICD-10-CM | POA: Diagnosis not present

## 2022-01-14 DIAGNOSIS — G4700 Insomnia, unspecified: Secondary | ICD-10-CM

## 2022-01-14 DIAGNOSIS — M160 Bilateral primary osteoarthritis of hip: Secondary | ICD-10-CM | POA: Diagnosis not present

## 2022-01-14 DIAGNOSIS — M5136 Other intervertebral disc degeneration, lumbar region: Secondary | ICD-10-CM | POA: Diagnosis not present

## 2022-01-14 DIAGNOSIS — M25512 Pain in left shoulder: Secondary | ICD-10-CM | POA: Diagnosis not present

## 2022-01-14 DIAGNOSIS — Z981 Arthrodesis status: Secondary | ICD-10-CM | POA: Diagnosis not present

## 2022-01-14 DIAGNOSIS — M545 Low back pain, unspecified: Secondary | ICD-10-CM | POA: Diagnosis not present

## 2022-01-14 DIAGNOSIS — M4802 Spinal stenosis, cervical region: Secondary | ICD-10-CM | POA: Diagnosis not present

## 2022-01-14 MED ORDER — ZOLPIDEM TARTRATE 10 MG PO TABS: 10.0000 mg | ORAL_TABLET | Freq: Every evening | ORAL | 2 refills | Status: DC | PRN

## 2022-01-14 NOTE — Telephone Encounter (Signed)
Rx sent to provider 

## 2022-01-14 NOTE — Telephone Encounter (Signed)
Prescription Request  01/14/2022  Is this a "Controlled Substance" medicine? No  LOV: 10/15/2021  What is the name of the medication or equipment? zolpidem (AMBIEN) 10 MG tablet   Have you contacted your pharmacy to request a refill? Yes   Which pharmacy would you like this sent to?  [facility] [place] - [address] [place] [address] [place] [address] Phone: [phone] Fax: [phone]    Patient notified that their request is being sent to the clinical staff for review and that they should receive a response within 2 business days.   Please advise at Mobile [phone] (mobile)

## 2022-01-14 NOTE — Telephone Encounter (Signed)
Last visit 10/15/2021 Next  04/19/2022

## 2022-01-18 ENCOUNTER — Encounter: Payer: Self-pay | Admitting: Infectious Diseases

## 2022-01-18 ENCOUNTER — Ambulatory Visit: Payer: PPO | Admitting: Infectious Diseases

## 2022-01-18 ENCOUNTER — Ambulatory Visit: Payer: PPO | Attending: Infectious Diseases | Admitting: Infectious Diseases

## 2022-01-18 VITALS — BP 168/85 | HR 101 | Temp 98.1°F | Ht 70.0 in | Wt 188.0 lb

## 2022-01-18 DIAGNOSIS — Z96642 Presence of left artificial hip joint: Secondary | ICD-10-CM | POA: Diagnosis not present

## 2022-01-18 DIAGNOSIS — E785 Hyperlipidemia, unspecified: Secondary | ICD-10-CM | POA: Insufficient documentation

## 2022-01-18 DIAGNOSIS — Z7984 Long term (current) use of oral hypoglycemic drugs: Secondary | ICD-10-CM | POA: Insufficient documentation

## 2022-01-18 DIAGNOSIS — Z8619 Personal history of other infectious and parasitic diseases: Secondary | ICD-10-CM | POA: Diagnosis not present

## 2022-01-18 DIAGNOSIS — T8450XD Infection and inflammatory reaction due to unspecified internal joint prosthesis, subsequent encounter: Secondary | ICD-10-CM | POA: Insufficient documentation

## 2022-01-18 DIAGNOSIS — I10 Essential (primary) hypertension: Secondary | ICD-10-CM | POA: Insufficient documentation

## 2022-01-18 DIAGNOSIS — T8454XA Infection and inflammatory reaction due to internal left knee prosthesis, initial encounter: Secondary | ICD-10-CM | POA: Diagnosis not present

## 2022-01-18 DIAGNOSIS — Z96653 Presence of artificial knee joint, bilateral: Secondary | ICD-10-CM | POA: Insufficient documentation

## 2022-01-18 DIAGNOSIS — Z79899 Other long term (current) drug therapy: Secondary | ICD-10-CM | POA: Insufficient documentation

## 2022-01-18 DIAGNOSIS — M25562 Pain in left knee: Secondary | ICD-10-CM | POA: Insufficient documentation

## 2022-01-18 DIAGNOSIS — Y828 Other medical devices associated with adverse incidents: Secondary | ICD-10-CM | POA: Diagnosis not present

## 2022-01-18 DIAGNOSIS — E119 Type 2 diabetes mellitus without complications: Secondary | ICD-10-CM | POA: Diagnosis not present

## 2022-01-18 MED ORDER — CEPHALEXIN 500 MG PO CAPS: 500.0000 mg | ORAL_CAPSULE | Freq: Four times a day (QID) | ORAL | 1 refills | Status: DC

## 2022-01-18 MED ORDER — ONDANSETRON HCL 4 MG PO TABS: 4.0000 mg | ORAL_TABLET | Freq: Three times a day (TID) | ORAL | 1 refills | Status: DC | PRN

## 2022-01-18 MED ORDER — RIFAMPIN 300 MG PO CAPS: 300.0000 mg | ORAL_CAPSULE | Freq: Two times a day (BID) | ORAL | 1 refills | Status: DC

## 2022-01-18 NOTE — Patient Instructions (Addendum)
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 You need to check CMP

## 2022-01-18 NOTE — Progress Notes (Signed)
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 JEREMIE GIANGRANDE is a 67 y.o. 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 He had some swelling and went to see ortho on 09/08/21- asked to take NSAID and labs ordered. On 9/1 patient called ortho as he had severe drainage of yellow, purulent stuff from the surgical site. A culture was taken and he was asked to get admitted On 09/11/21 he underwent I/D of left knee with polyethylene exchange and placement of antibiotic beads Culture positive for MSSA He underwent washout and exchange of the femoral component and liner- HE was sent on Iv cefazolin for a total of [redacted] weeks along with rifampin '300mg'$  PO BID because of retained hardware HE finished 6 weeks on 10/22/21 and went on  Po antibiotics On a follow up visit he was doing well but of late not so good I Past Medical History:  Diagnosis Date   Anxiety    Arthritis    Avascular necrosis of bones of both hips (HCC)    Bipolar disorder (HCC)    Bladder spasms    Chronic pain of left knee    Chronic pain syndrome    a.) followed by pain clinic   Cocaine use disorder, moderate, dependence ([place]) 09/09/2015   DDD (degenerative disc disease)    Depression     Erectile dysfunction    a.) on PDE5i (sildenafil)   Frequency-urgency syndrome    GERD (gastroesophageal reflux disease)    History of hepatitis B    1983  TX'D--  NO ISSUES OR SYMPTOMS SINCE   Hypotestosteronemia    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   Nocturia    Polysubstance abuse ([place])    a.) cocaine + marijuana + BZO + opioids   PONV (postoperative nausea and vomiting)    "difficult to put me to sleep"   Spinal headache    T2DM (type 2 diabetes mellitus) ([place])    Urine incontinence     Past Surgical History:  Procedure Laterality Date   APPLICATION OF WOUND VAC Left 09/11/2021   Procedure: APPLICATION OF WOUND VAC;  Surgeon: [name], MD;  Location: [facility];  Service: Orthopedics;  Laterality: Left;  [id number]    BACK SURGERY     2 rods and 4 screws artificial disc   INTERSTIM IMPLANT PLACEMENT  2007   INTERSTIM IMPLANT REVISION N/A 08/17/2012   Procedure: REPLACMENT OF IPG PLUS REPLACE LEAD OF INTERSTIM IMPLANT ;  Surgeon: [name], MD;  Location: [facility];  Service: Urology;  Laterality: N/A;   IRRIGATION AND DEBRIDEMENT KNEE Left 09/11/2021   Procedure: IRRIGATION AND DEBRIDEMENT WITH POLY EXCHANGE LEFT  KNEE;  Surgeon: [name], MD;  Location: [facility];  Service: Orthopedics;  Laterality: Left;   JOINT REPLACEMENT     Lt knee and Lt hip   LOWER EXTREMITY ANGIOGRAPHY Left 11/17/2020   Procedure: LOWER EXTREMITY ANGIOGRAPHY;  Surgeon: Katha Cabal, MD;  Location: [facility];  Service: Cardiovascular;  Laterality: Left;   LOWER EXTREMITY ANGIOGRAPHY Right 12/22/2020   Procedure: LOWER EXTREMITY ANGIOGRAPHY;  Surgeon: Katha Cabal, MD;  Location: [facility];  Service: Cardiovascular;  Laterality: Right;   LOWER EXTREMITY ANGIOGRAPHY Left 12/14/2021   Procedure: Lower Extremity Angiography;  Surgeon: Katha Cabal, MD;  Location: [facility];  Service: Cardiovascular;  Laterality: Left;   LUMBAR  DISC SURGERY  2005   L5   LUMBAR FUSION  X2  2006  &  2007   OPEN DEBRIDEMENT LEFT TOTAL KNEE (SCAR, BONEY GRAOWTH)/ REMOVAL OLD SUTURES  05-01-2007   PARTIAL HIP ARTHROPLASTY Left 2001   x2   SHOULDER OPEN ROTATOR CUFF REPAIR Left 2000   TOTAL HIP ARTHROPLASTY Left 2002   TOTAL KNEE ARTHROPLASTY Left    PARTIAL LEFT KNEE REPLACEMENT PRIOR TO THIS   TOTAL KNEE REVISION Left 2008   TOTAL KNEE REVISION Left 07/19/2021   Procedure: TOTAL KNEE REVISION;  Surgeon: [name], MD;  Location: [facility];  Service: Orthopedics;  Laterality: Left;   TRIGGER FINGER RELEASE Bilateral    SEVERAL FINGERS   TRIGGER FINGER RELEASE Right 06/18/2014   Procedure: RELEASE TRIGGER FINGER/A-1 PULLEY;  Surgeon: [name], MD;  Location: [facility];  Service: Orthopedics;  Laterality: Right;    Social History   Socioeconomic History   Marital status: Divorced    Spouse name: Not on file   Number of children: 3   Years of education: Not on file   Highest education level: Some college, no degree  Occupational History   Not on file  Tobacco Use   Smoking status: Never   Smokeless tobacco: Never  Vaping Use   Vaping Use: Never used  Substance and Sexual Activity   Alcohol use: Not Currently    Alcohol/week: 0.0 - 2.0 standard drinks of alcohol   Drug use: Not Currently    Types: Cocaine    Comment: pt states its been years since he used cocaine 6 yrs ago   Sexual activity: Not Currently  Other Topics Concern   Not on file  Social History Narrative    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, partime   Social Determinants of Health   Financial Resource Strain: [place]  (06/08/2021)   Overall Financial Resource Strain (CARDIA)    Difficulty of Paying Living Expenses: Not very hard  Food Insecurity: No Food Insecurity (06/08/2021)   Hunger Vital Sign    Worried About Running Out of Food in the Last Year: Never true    Ran Out of Food in the Last Year: Never true  Transportation Needs: Not on file  Physical  Activity: Not on file  Stress: Not on file  Social Connections: Not on file  Intimate Partner Violence: Not on file    Family History  Problem Relation Age of Onset   Diabetes Mother    Arthritis Mother    Cancer Mother        lung cancer   Hyperlipidemia Mother    Stroke Mother    Alcohol abuse Father    Cancer Father        lung  Arthritis Father    Diabetes Brother    Allergies  Allergen Reactions   Suboxone [Buprenorphine Hcl-Naloxone Hcl] Nausea And Vomiting   Tape Other (See Comments)    [place] *Paper Tape is ok    Codeine Rash, Itching and Nausea Only   Morphine And Related Itching and Rash   Other Rash    Telemetry electrodes   Silicone Rash    [place] -*Paper Tape is ok    I? Current Outpatient Medications  Medication Sig Dispense Refill   acetaminophen (TYLENOL) 500 MG tablet Take 1,500 mg by mouth 2 (two) times daily as needed for moderate pain or mild pain.     aspirin EC 81 MG tablet Take 81 mg by mouth daily. Swallow whole.     atorvastatin (LIPITOR) 20 MG tablet Take 20 mg by mouth at bedtime.     celecoxib (CELEBREX) 200 MG capsule Take 200 mg by mouth 2 (two) times daily.     cephALEXin (KEFLEX) 500 MG capsule Take 1 capsule (500 mg total) by mouth 4 (four) times daily. 120 capsule 4   cyclobenzaprine (FLEXERIL) 10 MG tablet SMARTSIG:1 Tablet(s) By Mouth Every 12 Hours     diclofenac (VOLTAREN) 75 MG EC tablet Take 75 mg by mouth 2 (two) times daily.     empagliflozin (JARDIANCE) 25 MG TABS tablet Take 1 tablet (25 mg total) by mouth daily. 90 tablet 1   famotidine (PEPCID) 20 MG tablet Take 1 tablet (20 mg total) by mouth 2 (two) times daily. 15 tablet 0   ferrous sulfate 324 MG TBEC TAKE ONE TABLET BY MOUTH MONDAY,WEDNESDAY,FRIDAY     lidocaine (LIDODERM) 5 % Place 1 patch onto the skin daily. Remove & Discard patch within 12 hours or as directed by MD 30 patch 0   metFORMIN (GLUCOPHAGE) 1000 MG tablet Take 500 mg by mouth 2 (two) times daily.      Multiple Vitamin (MULTIVITAMIN WITH MINERALS) TABS tablet Take 1 tablet by mouth in the morning.     oxyCODONE (ROXICODONE) 15 MG immediate release tablet Take 15 mg by mouth every 6 (six) hours.     OZEMPIC, 0.25 OR 0.5 MG/DOSE, 2 MG/3ML SOPN Inject 0.5 mg into the skin once a week.     pregabalin (LYRICA) 200 MG capsule Take 1 capsule (200 mg total) by mouth in the morning, at noon, and at bedtime. 90 capsule 0   rifampin (RIFADIN) 300 MG capsule Take 1 capsule (300 mg total) by mouth 2 (two) times daily. 60 capsule 4   terbinafine (LAMISIL) 250 MG tablet Take 1 tablet (250 mg total) by mouth daily. 90 tablet 0   tiZANidine (ZANAFLEX) 4 MG tablet Take 4 mg by mouth 3 (three) times daily as needed.     zolpidem (AMBIEN) 10 MG tablet Take 1 tablet (10 mg total) by mouth at bedtime as needed. for sleep 30 tablet 2   pantoprazole (PROTONIX) 40 MG tablet Take 1 tablet (40 mg total) by mouth daily. 90 tablet 3   No current facility-administered medications for this visit.     Abtx:  Anti-infectives (From admission, onward)    None       REVIEW OF SYSTEMS:  Const: negative fever, negative chills, negative weight loss Eyes: negative diplopia or visual changes, negative eye pain ENT: negative coryza, negative sore throat Resp: negative cough, hemoptysis, dyspnea Cards: negative for chest pain, palpitations, lower extremity edema GU: negative for frequency, dysuria and hematuria GI: Negative for abdominal pain, diarrhea, bleeding, constipation  Skin: negative for rash and pruritus Heme: negative for easy bruising and gum/nose bleeding MS: pain, swelling and stiffness left knee Neurolo:negative for headaches, dizziness, vertigo, memory problems  Psych: negative for feelings of anxiety, depression  Endocrine: negative for thyroid, diabetes Allergy/Immunology- as above ? Objective:  VITALS:  BP (!) 168/85   Pulse (!) 101   Temp 98.1 F (36.7 C) (Temporal)   Ht '5\' 10"'$  (1.778 m)   Wt  188 lb (85.3 kg)   BMI 26.98 kg/m   PHYSICAL EXAM:  General: Alert, cooperative, no distress, appears stated age.  Head: Normocephalic, without obvious abnormality, atraumatic. Eyes: Conjunctivae clear, anicteric sclerae. Pupils are equal ENT Nares normal. No drainage or sinus tenderness. Lips, mucosa, and tongue normal. No Thrush Neck: Supple, symmetrical, no adenopathy, thyroid: non tender no carotid bruit and no JVD. Back: No CVA tenderness. Lungs: Clear to auscultation bilaterally. No Wheezing or Rhonchi. No rales. Heart: Regular rate and rhythm, no murmur, rub or gallop. Abdomen: Soft, non-tender,not distended. Bowel sounds normal. No masses Extremities: left knee surgical scar healed well Knee is swollen, warmer than rt knee and some tenderness lateral side Skin: No rashes or lesions. Or bruising Lymph: Cervical, supraclavicular normal. Neurologic: Grossly non-focal Pertinent Labs  ESR 67>93 CRP also increased  ? Impression/Recommendation ? ?Left knee PJI with MSSA- s/p ID and exchange of femoral componenet and liner Completed  6 weeks of IV cefazin + rifampin on 10/13, followed by ongoing cephalexin '500mg'$  Q6 + rifampin '300mg'$  BID for a total of 6 weeks.. was initially doing very well but now has more pain and swelling- He also has only been taking 50% of prescribed dose for the past few weeks Labs from 01/12/22 is showing worsenign ESR and CRP  ESR 140> 68 > 94 CRP 4.4> 7.4 ( equivalent- 74)  Will discuss with Dr.Hooten regarding aspirating the knee and sending for cell count and culture He will restart full dose keflex ( '500mg'$  Q6 ) and Rifampin ( '300mg'$  BID) along with PRN zofran   Treated HEPC- H/o substance use - clean for 5 years      DM on metformin and ozempic   HTN on atenolol and clonidine   HLD on atorvastatin   ? ___________________________________________________ Discussed with patient, in detail Follow up 4 weeks Note:  This document was prepared  using Dragon voice recognition software and may include unintentional dictation errors.

## 2022-01-21 ENCOUNTER — Telehealth: Payer: Self-pay

## 2022-01-21 NOTE — Telephone Encounter (Signed)
Received voicemail from Erik Martinez with [employer] requesting additional information regarding ondansetron prescription for [place] appeal request.   Called her back to discuss, no answer. Left voicemail requesting call back.   Claudine Mouton: [phone]  Beryle Flock, RN

## 2022-01-21 NOTE — Telephone Encounter (Signed)
Received call back from [facility], relayed diagnosis for ondansetron. No other information needed at this time.   Beryle Flock, RN

## 2022-01-27 ENCOUNTER — Other Ambulatory Visit
Admission: RE | Admit: 2022-01-27 | Discharge: 2022-01-27 | Disposition: A | Discharge status: Home / Self Care | Payer: PPO | Source: Ambulatory Visit | Attending: Sports Medicine | Admitting: Sports Medicine

## 2022-01-27 DIAGNOSIS — T8459XD Infection and inflammatory reaction due to other internal joint prosthesis, subsequent encounter: Secondary | ICD-10-CM | POA: Insufficient documentation

## 2022-01-27 DIAGNOSIS — M25562 Pain in left knee: Secondary | ICD-10-CM | POA: Diagnosis not present

## 2022-01-27 DIAGNOSIS — Z96652 Presence of left artificial knee joint: Secondary | ICD-10-CM | POA: Diagnosis not present

## 2022-01-27 DIAGNOSIS — Z96659 Presence of unspecified artificial knee joint: Secondary | ICD-10-CM | POA: Diagnosis not present

## 2022-01-27 DIAGNOSIS — G8929 Other chronic pain: Secondary | ICD-10-CM | POA: Diagnosis not present

## 2022-01-27 DIAGNOSIS — T84033D Mechanical loosening of internal left knee prosthetic joint, subsequent encounter: Secondary | ICD-10-CM | POA: Insufficient documentation

## 2022-01-27 LAB — SYNOVIAL CELL COUNT + DIFF, W/ CRYSTALS
Crystals, Fluid: NONE SEEN
Eosinophils-Synovial: 0 % (ref 0–1)
Lymphocytes-Synovial Fld: 0 % (ref 0–20)
Monocyte-Macrophage-Synovial Fluid: 0 % — ABNORMAL LOW (ref 50–90)
Neutrophil, Synovial: 100 % — ABNORMAL HIGH (ref 0–25)
WBC, Synovial: 29089 /mm3 — ABNORMAL HIGH (ref 0–200)

## 2022-01-28 ENCOUNTER — Ambulatory Visit (INDEPENDENT_AMBULATORY_CARE_PROVIDER_SITE_OTHER): Payer: PPO

## 2022-01-28 VITALS — Wt 188.0 lb

## 2022-01-28 DIAGNOSIS — Z0000 Encounter for general adult medical examination without abnormal findings: Secondary | ICD-10-CM | POA: Diagnosis not present

## 2022-01-28 NOTE — Progress Notes (Signed)
I connected with  Erik Martinez on 01/28/22 by a audio enabled telemedicine application and verified that I am speaking with the correct person using two identifiers.  Patient Location: Home  Provider Location: Office/Clinic  I discussed the limitations of evaluation and management by telemedicine. The patient expressed understanding and agreed to proceed.   Subjective:   Erik Martinez is a 67 y.o. male who presents for an Initial Medicare Annual Wellness Visit.  Review of Systems     Cardiac Risk Factors include: advanced age (>59mn, >>7women);male gender     Objective:    Today's Vitals   01/28/22 1430  Weight: 188 lb (85.3 kg)   Body mass index is 26.98 kg/m.     01/28/2022    2:34 PM 09/10/2021    4:19 PM 09/10/2021    4:04 PM 08/01/2021    5:09 AM 07/20/2021    9:00 PM 07/07/2021   11:27 AM 12/22/2020    7:19 AM  Advanced Directives  Does Patient Have a Medical Advance Directive? No  No No No No No  Would patient like information on creating a medical advance directive? No - Patient declined No - Patient declined  No - Patient declined No - Patient declined      Current Medications (verified) Outpatient Encounter Medications as of 01/28/2022  Medication Sig   acetaminophen (TYLENOL) 500 MG tablet Take 1,500 mg by mouth 2 (two) times daily as needed for moderate pain or mild pain.   aspirin EC 81 MG tablet Take 81 mg by mouth daily. Swallow whole.   celecoxib (CELEBREX) 200 MG capsule Take 200 mg by mouth 2 (two) times daily.   cephALEXin (KEFLEX) 500 MG capsule Take 1 capsule (500 mg total) by mouth 4 (four) times daily.   cyclobenzaprine (FLEXERIL) 10 MG tablet SMARTSIG:1 Tablet(s) By Mouth Every 12 Hours   diclofenac (VOLTAREN) 75 MG EC tablet Take 75 mg by mouth 2 (two) times daily.   empagliflozin (JARDIANCE) 25 MG TABS tablet Take 1 tablet (25 mg total) by mouth daily.   famotidine (PEPCID) 20 MG tablet Take 1 tablet (20 mg total) by mouth 2 (two) times daily.    ferrous sulfate 324 MG TBEC TAKE ONE TABLET BY MOUTH MONDAY,WEDNESDAY,FRIDAY   lidocaine (LIDODERM) 5 % Place 1 patch onto the skin daily. Remove & Discard patch within 12 hours or as directed by MD   metFORMIN (GLUCOPHAGE) 1000 MG tablet Take 500 mg by mouth 2 (two) times daily.   Multiple Vitamin (MULTIVITAMIN WITH MINERALS) TABS tablet Take 1 tablet by mouth in the morning.   ondansetron (ZOFRAN) 4 MG tablet Take 1 tablet (4 mg total) by mouth every 8 (eight) hours as needed for nausea or vomiting.   oxyCODONE (ROXICODONE) 15 MG immediate release tablet Take 15 mg by mouth every 6 (six) hours.   OZEMPIC, 0.25 OR 0.5 MG/DOSE, 2 MG/3ML SOPN Inject 0.5 mg into the skin once a week.   pregabalin (LYRICA) 200 MG capsule Take 1 capsule (200 mg total) by mouth in the morning, at noon, and at bedtime.   rifampin (RIFADIN) 300 MG capsule Take 1 capsule (300 mg total) by mouth 2 (two) times daily.   terbinafine (LAMISIL) 250 MG tablet Take 1 tablet (250 mg total) by mouth daily.   tiZANidine (ZANAFLEX) 4 MG tablet Take 4 mg by mouth 3 (three) times daily as needed.   zolpidem (AMBIEN) 10 MG tablet Take 1 tablet (10 mg total) by mouth at bedtime as  needed. for sleep   atorvastatin (LIPITOR) 20 MG tablet Take 20 mg by mouth at bedtime. (Patient not taking: Reported on 01/28/2022)   pantoprazole (PROTONIX) 40 MG tablet Take 1 tablet (40 mg total) by mouth daily.   No facility-administered encounter medications on file as of 01/28/2022.    Allergies (verified) Suboxone [buprenorphine hcl-naloxone hcl], Tape, Codeine, Morphine and related, Other, and Silicone   History: Past Medical History:  Diagnosis Date   Anxiety    Arthritis    Avascular necrosis of bones of both hips (HCC)    Bipolar disorder (HCC)    Bladder spasms    Chronic pain of left knee    Chronic pain syndrome    a.) followed by pain clinic   Cocaine use disorder, moderate, dependence ([place]) 09/09/2015   DDD (degenerative disc  disease)    Depression    Erectile dysfunction    a.) on PDE5i (sildenafil)   Frequency-urgency syndrome    GERD (gastroesophageal reflux disease)    History of hepatitis B    1983  TX'D--  NO ISSUES OR SYMPTOMS SINCE   Hypotestosteronemia    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   Nocturia    Polysubstance abuse (HCC)    a.) cocaine + marijuana + BZO + opioids   PONV (postoperative nausea and vomiting)    "difficult to put me to sleep"   Spinal headache    T2DM (type 2 diabetes mellitus) ([place])    Urine incontinence    Past Surgical History:  Procedure Laterality Date   APPLICATION OF WOUND VAC Left 09/11/2021   Procedure: APPLICATION OF WOUND VAC;  Surgeon: [name], MD;  Location: [facility];  Service: Orthopedics;  Laterality: Left;  [id number]    BACK SURGERY     2 rods and 4 screws artificial disc   INTERSTIM IMPLANT PLACEMENT  2007   INTERSTIM IMPLANT REVISION N/A 08/17/2012   Procedure: REPLACMENT OF IPG PLUS REPLACE LEAD OF INTERSTIM IMPLANT ;  Surgeon: [name], MD;  Location: [facility];  Service: Urology;  Laterality: N/A;   IRRIGATION AND DEBRIDEMENT KNEE Left 09/11/2021   Procedure: IRRIGATION AND DEBRIDEMENT WITH POLY EXCHANGE LEFT KNEE;  Surgeon: [name], MD;  Location: [facility];  Service: Orthopedics;  Laterality: Left;   JOINT REPLACEMENT     Lt knee and Lt hip   LOWER EXTREMITY ANGIOGRAPHY Left 11/17/2020   Procedure: LOWER EXTREMITY ANGIOGRAPHY;  Surgeon: Katha Cabal, MD;  Location: [facility];  Service: Cardiovascular;  Laterality: Left;   LOWER EXTREMITY ANGIOGRAPHY Right 12/22/2020   Procedure: LOWER EXTREMITY ANGIOGRAPHY;  Surgeon: Katha Cabal, MD;  Location: [facility];  Service: Cardiovascular;  Laterality: Right;   LOWER EXTREMITY ANGIOGRAPHY Left 12/14/2021   Procedure: Lower Extremity Angiography;  Surgeon: Katha Cabal, MD;  Location: [facility];  Service: Cardiovascular;   Laterality: Left;   LUMBAR DISC SURGERY  2005   L5   LUMBAR FUSION  X2  2006  &  2007   OPEN DEBRIDEMENT LEFT TOTAL KNEE (SCAR, BONEY GRAOWTH)/ REMOVAL OLD SUTURES  05-01-2007   PARTIAL HIP ARTHROPLASTY Left 2001   x2   SHOULDER OPEN ROTATOR CUFF REPAIR Left 2000   TOTAL HIP ARTHROPLASTY Left 2002   TOTAL KNEE ARTHROPLASTY Left    PARTIAL LEFT KNEE REPLACEMENT PRIOR TO THIS   TOTAL KNEE REVISION Left 2008   TOTAL KNEE REVISION Left 07/19/2021   Procedure: TOTAL KNEE REVISION;  Surgeon:  Hooten, Laurice Record, MD;  Location: [facility];  Service: Orthopedics;  Laterality: Left;   TRIGGER FINGER RELEASE Bilateral    SEVERAL FINGERS   TRIGGER FINGER RELEASE Right 06/18/2014   Procedure: RELEASE TRIGGER FINGER/A-1 PULLEY;  Surgeon: [name], MD;  Location: [facility];  Service: Orthopedics;  Laterality: Right;   Family History  Problem Relation Age of Onset   Diabetes Mother    Arthritis Mother    Cancer Mother        lung cancer   Hyperlipidemia Mother    Stroke Mother    Alcohol abuse Father    Cancer Father        lung   Arthritis Father    Diabetes Brother    Social History   Socioeconomic History   Marital status: Divorced    Spouse name: Not on file   Number of children: 3   Years of education: Not on file   Highest education level: Some college, no degree  Occupational History   Not on file  Tobacco Use   Smoking status: Never   Smokeless tobacco: Never  Vaping Use   Vaping Use: Never used  Substance and Sexual Activity   Alcohol use: Not Currently    Alcohol/week: 0.0 - 2.0 standard drinks of alcohol   Drug use: Not Currently    Types: Cocaine    Comment: pt states its been years since he used cocaine 6 yrs ago   Sexual activity: Not Currently  Other Topics Concern   Not on file  Social History Narrative    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, partime   Social Determinants of Health   Financial Resource Strain: [place]  (01/28/2022)   Overall Financial Resource Strain  (CARDIA)    Difficulty of Paying Living Expenses: Not hard at all  Food Insecurity: No Food Insecurity (01/28/2022)   Hunger Vital Sign    Worried About Running Out of Food in the Last Year: Never true    [place] in the Last Year: Never true  Transportation Needs: No Transportation Needs (01/28/2022)   PRAPARE - Hydrologist (Medical): No    Lack of Transportation (Non-Medical): No  Physical Activity: Inactive (01/28/2022)   Exercise Vital Sign    Days of Exercise per Week: 0 days    Minutes of Exercise per Session: 0 min  Stress: No Stress Concern Present (01/28/2022)   [place]    Feeling of Stress : Not at all  Social Connections: Moderately Isolated (01/28/2022)   Social Connection and Isolation Panel [NHANES]    Frequency of Communication with Friends and Family: More than three times a week    Frequency of Social Gatherings with Friends and Family: More than three times a week    Attends Religious Services: More than 4 times per year    Active Member of [employer] or Organizations: No    Attends Music therapist: Never    Marital Status: Divorced    Tobacco Counseling Counseling given: Not Answered   Clinical Intake:  Pre-visit preparation completed: Yes  Pain : No/denies pain     BMI - recorded: 26.98 Nutritional Status: BMI 25 -29 Overweight Nutritional Risks: None Diabetes: Yes (122 per pt) CBG done?: No Did pt. bring in CBG monitor from home?: No  How often do you need to have someone help you when you read instructions, pamphlets, or other written materials from  your doctor or pharmacy?: 1 - Never  Diabetic?Nutrition Risk Assessment:  Has the patient had any N/V/D within the last 2 months?  No  Does the patient have any non-healing wounds?  No  Has the patient had any unintentional weight loss or weight gain?  No   Diabetes:  Is the patient  diabetic?  Yes  If diabetic, was a CBG obtained today?  Yes  Did the patient bring in their glucometer from home?  No  How often do you monitor your CBG's? Twice a day .   Financial Strains and Diabetes Management:  Are you having any financial strains with the device, your supplies or your medication? No .  Does the patient want to be seen by Chronic Care Management for management of their diabetes?  No  Would the patient like to be referred to a Nutritionist or for Diabetic Management?  No   Diabetic Exams:  Diabetic Eye Exam: Overdue for diabetic eye exam. Pt has been advised about the importance in completing this exam. Patient advised to call and schedule an eye exam. Diabetic Foot Exam: Overdue, Pt has been advised about the importance in completing this exam. Pt is scheduled for diabetic foot exam on next appt .   Interpreter Needed?: No  Information entered by :: Charlott Rakes, LPN   Activities of Daily Living    01/28/2022    2:36 PM 12/14/2021   12:05 PM  In your present state of health, do you have any difficulty performing the following activities:  Hearing? 0 0  Vision? 0 0  Difficulty concentrating or making decisions? 0 0  Walking or climbing stairs? 0 0  Dressing or bathing? 0 0  Doing errands, shopping? 0   Preparing Food and eating ? N   Using the Toilet? N   In the past six months, have you accidently leaked urine? N   Do you have problems with loss of bowel control? N   Managing your Medications? N   Managing your Finances? N   Housekeeping or managing your Housekeeping? N     Patient Care Team: Jinny Sanders, MD as PCP - General (Family Medicine) Charlton Haws, [facility] as Pharmacist (Pharmacist)  Indicate any recent Medical Services you may have received from other than [facility] providers in the past year (date may be approximate).     Assessment:   This is a routine wellness examination for Erik Martinez.  Hearing/Vision screen Hearing Screening -  Comments:: Pt denies any hearing issues  Vision Screening - Comments:: [facility] will need new provider   Dietary issues and exercise activities discussed: Current Exercise Habits: The patient does not participate in regular exercise at present   Goals Addressed             This Visit's Progress    Patient Stated       None at this time        Depression Screen    01/28/2022    2:34 PM 10/19/2021    9:58 AM 08/17/2021    9:49 AM 11/11/2020    3:21 PM 08/11/2020    8:16 AM 06/02/2020    1:56 PM  PHQ 2/9 Scores  PHQ - 2 Score 0 0  0 0 0  PHQ- 9 Score           Information is confidential and restricted. Go to Review Flowsheets to unlock data.    Fall Risk    01/28/2022  2:36 PM 10/19/2021    9:58 AM 08/03/2021    3:45 PM 11/11/2020    3:21 PM 08/11/2020    8:16 AM  Fall Risk   Falls in the past year? 0 0 0 0 0  Number falls in past yr: 0 0  0 0  Injury with Fall? 0 0  0   Risk for fall due to : Impaired vision   No Fall Risks   Follow up Falls prevention discussed  Falls evaluation completed Falls evaluation completed Falls evaluation completed    [place]:  Any stairs in or around the home? Yes  If so, are there any without handrails? No  Home free of loose throw rugs in walkways, pet beds, electrical cords, etc? Yes  Adequate lighting in your home to reduce risk of falls? Yes   ASSISTIVE DEVICES UTILIZED TO PREVENT FALLS:  Life alert? No  Use of a cane, walker or w/c? No  Grab bars in the bathroom? No  Shower chair or bench in shower? No  Elevated toilet seat or a handicapped toilet? No   TIMED UP AND GO:  Was the test performed? No .   Cognitive Function:        Immunizations Immunization History  Administered Date(s) Administered   Moderna Sars-Covid-2 Vaccination 03/28/2019, 05/01/2019, 01/09/2020    TDAP status: Due, Education has been provided regarding the importance of this vaccine.  Advised may receive this vaccine at local pharmacy or Health Dept. Aware to provide a copy of the vaccination record if obtained from local pharmacy or Health Dept. Verbalized acceptance and understanding.  Flu Vaccine status: Declined, Education has been provided regarding the importance of this vaccine but patient still declined. Advised may receive this vaccine at local pharmacy or Health Dept. Aware to provide a copy of the vaccination record if obtained from local pharmacy or Health Dept. Verbalized acceptance and understanding.  Pneumococcal vaccine status: Declined,  Education has been provided regarding the importance of this vaccine but patient still declined. Advised may receive this vaccine at local pharmacy or Health Dept. Aware to provide a copy of the vaccination record if obtained from local pharmacy or Health Dept. Verbalized acceptance and understanding.   Covid-19 vaccine status: Completed vaccines  Qualifies for Shingles Vaccine? Yes   Zostavax completed No   Shingrix Completed?: No.    Education has been provided regarding the importance of this vaccine. Patient has been advised to call insurance company to determine out of pocket expense if they have not yet received this vaccine. Advised may also receive vaccine at local pharmacy or Health Dept. Verbalized acceptance and understanding.  Screening Tests Health Maintenance  Topic Date Due   FOOT EXAM  Never done   Diabetic kidney evaluation - Urine ACR  Never done   DTaP/Tdap/Td (1 - Tdap) Never done   COLONOSCOPY (Pts 45-32yr Insurance coverage will need to be confirmed)  Never done   OPHTHALMOLOGY EXAM  07/23/2021   COVID-19 Vaccine (4 - 2023-24 season) 09/10/2021   INFLUENZA VACCINE  04/10/2022 (Originally 08/10/2021)   Zoster Vaccines- Shingrix (1 of 2) 04/29/2022 (Originally 11/21/1974)   Pneumonia Vaccine 66 Years old (1 - PCV) 01/29/2023 (Originally 11/20/2021)   HEMOGLOBIN A1C  04/16/2022   Diabetic kidney  evaluation - eGFR measurement  12/15/2022   Medicare Annual Wellness (AWV)  01/29/2023   Hepatitis C Screening  Completed   HIV Screening  Completed   HPV VACCINES  Aged Out  Health Maintenance  Health Maintenance Due  Topic Date Due   FOOT EXAM  Never done   Diabetic kidney evaluation - Urine ACR  Never done   DTaP/Tdap/Td (1 - Tdap) Never done   COLONOSCOPY (Pts 45-23yr Insurance coverage will need to be confirmed)  Never done   OPHTHALMOLOGY EXAM  07/23/2021   COVID-19 Vaccine (4 - 2023-24 season) 09/10/2021    Pt stated he completed colonoscopy 1  ago   Additional Screening:  Hepatitis C Screening:  Completed 06/2821  Vision Screening: Recommended annual ophthalmology exams for early detection of glaucoma and other disorders of the eye. Is the patient up to date with their annual eye exam?  No  Who is the provider or what is the name of the office in which the patient attends annual eye exams? [facility]  If pt is not established with a provider, would they like to be referred to a provider to establish care? No .   Dental Screening: Recommended annual dental exams for proper oral hygiene  Community Resource Referral / Chronic Care Management: CRR required this visit?  No   CCM required this visit?  No      Plan:     I have personally reviewed and noted the following in the patient's chart:   Medical and social history Use of alcohol, tobacco or illicit drugs  Current medications and supplements including opioid prescriptions. Patient is currently taking opioid prescriptions. Information provided to patient regarding non-opioid alternatives. Patient advised to discuss non-opioid treatment plan with their provider. Functional ability and status Nutritional status Physical activity Advanced directives List of other physicians Hospitalizations, surgeries, and ER visits in previous 12 months Vitals Screenings to include cognitive, depression, and  falls Referrals and appointments  In addition, I have reviewed and discussed with patient certain preventive protocols, quality metrics, and best practice recommendations. A written personalized care plan for preventive services as well as general preventive health recommendations were provided to patient.     TWillette Brace LPN   16/01/930  Nurse Notes: pt declined 6 CIT stating he had too many distractions with his dogs to concentrate, pt was knowledgeable to questions asked.

## 2022-01-28 NOTE — Patient Instructions (Signed)
[name] , Thank you for taking time to come for your Medicare Wellness Visit. I appreciate your ongoing commitment to your health goals. Please review the following plan we discussed and let me know if I can assist you in the future.   These are the goals we discussed:  Goals   None     This is a list of the screening recommended for you and due dates:  Health Maintenance  Topic Date Due   Complete foot exam   Never done   Yearly kidney health urinalysis for diabetes  Never done   DTaP/Tdap/Td vaccine (1 - Tdap) Never done   Colon Cancer Screening  Never done   Eye exam for diabetics  07/23/2021   COVID-19 Vaccine (4 - 2023-24 season) 09/10/2021   Flu Shot  04/10/2022*   Zoster (Shingles) Vaccine (1 of 2) 04/29/2022*   Pneumonia Vaccine (1 - PCV) 01/29/2023*   Hemoglobin A1C  04/16/2022   Yearly kidney function blood test for diabetes  12/15/2022   Medicare Annual Wellness Visit  01/29/2023   Hepatitis C Screening: USPSTF Recommendation to screen - Ages 18-79 yo.  Completed   HIV Screening  Completed   HPV Vaccine  Aged Out  *Topic was postponed. The date shown is not the original due date.    Advanced directives: Advance directive discussed with you today. Even though you declined this today please call our office should you change your mind and we can give you the proper paperwork for you to fill out.  Conditions/risks identified: none at this time   Next appointment: Follow up in one year for your annual wellness visit.   Preventive Care 14 Years and Older, Male  Preventive care refers to lifestyle choices and visits with your health care provider that can promote health and wellness. What does preventive care include? A yearly physical exam. This is also called an annual well check. Dental exams once or twice a year. Routine eye exams. Ask your health care provider how often you should have your eyes checked. Personal lifestyle choices, including: Daily care of your  teeth and gums. Regular physical activity. Eating a healthy diet. Avoiding tobacco and drug use. Limiting alcohol use. Practicing safe sex. Taking low doses of aspirin every day. Taking vitamin and mineral supplements as recommended by your health care provider. What happens during an annual well check? The services and screenings done by your health care provider during your annual well check will depend on your age, overall health, lifestyle risk factors, and family history of disease. Counseling  Your health care provider may ask you questions about your: Alcohol use. Tobacco use. Drug use. Emotional well-being. Home and relationship well-being. Sexual activity. Eating habits. History of falls. Memory and ability to understand (cognition). Work and work Statistician. Screening  You may have the following tests or measurements: Height, weight, and BMI. Blood pressure. Lipid and cholesterol levels. These may be checked every 5 years, or more frequently if you are over 4 years old. Skin check. Lung cancer screening. You may have this screening every year starting at age 18 if you have a 30-pack-year history of smoking and currently smoke or have quit within the past 15 years. Fecal occult blood test (FOBT) of the stool. You may have this test every year starting at age 46. Flexible sigmoidoscopy or colonoscopy. You may have a sigmoidoscopy every 5 years or a colonoscopy every 10 years starting at age 12. Prostate cancer screening. Recommendations will vary depending on  your family history and other risks. Hepatitis C blood test. Hepatitis B blood test. Sexually transmitted disease (STD) testing. Diabetes screening. This is done by checking your blood sugar (glucose) after you have not eaten for a while (fasting). You may have this done every 1-3 years. Abdominal aortic aneurysm (AAA) screening. You may need this if you are a current or former smoker. Osteoporosis. You may be  screened starting at age 4 if you are at high risk. Talk with your health care provider about your test results, treatment options, and if necessary, the need for more tests. Vaccines  Your health care provider may recommend certain vaccines, such as: Influenza vaccine. This is recommended every year. Tetanus, diphtheria, and acellular pertussis (Tdap, Td) vaccine. You may need a Td booster every 10 years. Zoster vaccine. You may need this after age 29. Pneumococcal 13-valent conjugate (PCV13) vaccine. One dose is recommended after age 104. Pneumococcal polysaccharide (PPSV23) vaccine. One dose is recommended after age 64. Talk to your health care provider about which screenings and vaccines you need and how often you need them. This information is not intended to replace advice given to you by your health care provider. Make sure you discuss any questions you have with your health care provider. Document Released: 01/23/2015 Document Revised: 09/16/2015 Document Reviewed: 10/28/2014 Elsevier Interactive Patient Education  2017 [place] Prevention in the Home Falls can cause injuries. They can happen to people of all ages. There are many things you can do to make your home safe and to help prevent falls. What can I do on the outside of my home? Regularly fix the edges of walkways and driveways and fix any cracks. Remove anything that might make you trip as you walk through a door, such as a raised step or threshold. Trim any bushes or trees on the path to your home. Use bright outdoor lighting. Clear any walking paths of anything that might make someone trip, such as rocks or tools. Regularly check to see if handrails are loose or broken. Make sure that both sides of any steps have handrails. Any raised decks and porches should have guardrails on the edges. Have any leaves, snow, or ice cleared regularly. Use sand or salt on walking paths during winter. Clean up any spills in  your garage right away. This includes oil or grease spills. What can I do in the bathroom? Use night lights. Install grab bars by the toilet and in the tub and shower. Do not use towel bars as grab bars. Use non-skid mats or decals in the tub or shower. If you need to sit down in the shower, use a plastic, non-slip stool. Keep the floor dry. Clean up any water that spills on the floor as soon as it happens. Remove soap buildup in the tub or shower regularly. Attach bath mats securely with double-sided non-slip rug tape. Do not have throw rugs and other things on the floor that can make you trip. What can I do in the bedroom? Use night lights. Make sure that you have a light by your bed that is easy to reach. Do not use any sheets or blankets that are too big for your bed. They should not hang down onto the floor. Have a firm chair that has side arms. You can use this for support while you get dressed. Do not have throw rugs and other things on the floor that can make you trip. What can I do in the kitchen? Clean  up any spills right away. Avoid walking on wet floors. Keep items that you use a lot in easy-to-reach places. If you need to reach something above you, use a strong step stool that has a grab bar. Keep electrical cords out of the way. Do not use floor polish or wax that makes floors slippery. If you must use wax, use non-skid floor wax. Do not have throw rugs and other things on the floor that can make you trip. What can I do with my stairs? Do not leave any items on the stairs. Make sure that there are handrails on both sides of the stairs and use them. Fix handrails that are broken or loose. Make sure that handrails are as long as the stairways. Check any carpeting to make sure that it is firmly attached to the stairs. Fix any carpet that is loose or worn. Avoid having throw rugs at the top or bottom of the stairs. If you do have throw rugs, attach them to the floor with carpet  tape. Make sure that you have a light switch at the top of the stairs and the bottom of the stairs. If you do not have them, ask someone to add them for you. What else can I do to help prevent falls? Wear shoes that: Do not have high heels. Have rubber bottoms. Are comfortable and fit you well. Are closed at the toe. Do not wear sandals. If you use a stepladder: Make sure that it is fully opened. Do not climb a closed stepladder. Make sure that both sides of the stepladder are locked into place. Ask someone to hold it for you, if possible. Clearly mark and make sure that you can see: Any grab bars or handrails. First and last steps. Where the edge of each step is. Use tools that help you move around (mobility aids) if they are needed. These include: Canes. Walkers. Scooters. Crutches. Turn on the lights when you go into a dark area. Replace any light bulbs as soon as they burn out. Set up your furniture so you have a clear path. Avoid moving your furniture around. If any of your floors are uneven, fix them. If there are any pets around you, be aware of where they are. Review your medicines with your doctor. Some medicines can make you feel dizzy. This can increase your chance of falling. Ask your doctor what other things that you can do to help prevent falls. This information is not intended to replace advice given to you by your health care provider. Make sure you discuss any questions you have with your health care provider. Document Released: 10/23/2008 Document Revised: 06/04/2015 Document Reviewed: 01/31/2014 Elsevier Interactive Patient Education  2017 [name].

## 2022-02-03 DIAGNOSIS — Z79891 Long term (current) use of opiate analgesic: Secondary | ICD-10-CM | POA: Diagnosis not present

## 2022-02-03 DIAGNOSIS — G890 Central pain syndrome: Secondary | ICD-10-CM | POA: Diagnosis not present

## 2022-02-03 DIAGNOSIS — M160 Bilateral primary osteoarthritis of hip: Secondary | ICD-10-CM | POA: Diagnosis not present

## 2022-02-03 DIAGNOSIS — Z981 Arthrodesis status: Secondary | ICD-10-CM | POA: Diagnosis not present

## 2022-02-03 DIAGNOSIS — M25512 Pain in left shoulder: Secondary | ICD-10-CM | POA: Diagnosis not present

## 2022-02-03 DIAGNOSIS — M5136 Other intervertebral disc degeneration, lumbar region: Secondary | ICD-10-CM | POA: Diagnosis not present

## 2022-02-03 DIAGNOSIS — M545 Low back pain, unspecified: Secondary | ICD-10-CM | POA: Diagnosis not present

## 2022-02-03 DIAGNOSIS — G894 Chronic pain syndrome: Secondary | ICD-10-CM | POA: Diagnosis not present

## 2022-02-03 DIAGNOSIS — M4802 Spinal stenosis, cervical region: Secondary | ICD-10-CM | POA: Diagnosis not present

## 2022-02-08 ENCOUNTER — Telehealth: Payer: Self-pay

## 2022-02-08 DIAGNOSIS — Z96652 Presence of left artificial knee joint: Secondary | ICD-10-CM | POA: Diagnosis not present

## 2022-02-08 DIAGNOSIS — T8459XD Infection and inflammatory reaction due to other internal joint prosthesis, subsequent encounter: Secondary | ICD-10-CM | POA: Diagnosis not present

## 2022-02-08 NOTE — Telephone Encounter (Signed)
Dr. Marry Guan with ortho called to let Dr. Delaine Lame know that he aspirated patient knee and did a culture. The culture came back positive for staph areus and sensitive to most antibiotics.  He will be reach out to the patient to discuss further treatment plan as well. Dr. Marry Guan stated patient will need surgery, but patient may decline surgery and Dr. Marry Guan would like to have a back up treatment plan if patient declines.  He would like Dr. Linus Salmons to give him a call at [phone] to also discuss the patient since Dr.Ravishankar is away.  Dallis Czaja T Brooks Sailors

## 2022-02-15 DIAGNOSIS — Z96652 Presence of left artificial knee joint: Secondary | ICD-10-CM | POA: Diagnosis not present

## 2022-02-17 DIAGNOSIS — Z96652 Presence of left artificial knee joint: Secondary | ICD-10-CM | POA: Diagnosis not present

## 2022-02-22 ENCOUNTER — Ambulatory Visit: Payer: PPO | Attending: Infectious Diseases | Admitting: Infectious Diseases

## 2022-02-22 DIAGNOSIS — Z791 Long term (current) use of non-steroidal anti-inflammatories (NSAID): Secondary | ICD-10-CM | POA: Insufficient documentation

## 2022-02-22 DIAGNOSIS — E785 Hyperlipidemia, unspecified: Secondary | ICD-10-CM | POA: Diagnosis not present

## 2022-02-22 DIAGNOSIS — B9561 Methicillin susceptible Staphylococcus aureus infection as the cause of diseases classified elsewhere: Secondary | ICD-10-CM | POA: Diagnosis not present

## 2022-02-22 DIAGNOSIS — M00062 Staphylococcal arthritis, left knee: Secondary | ICD-10-CM | POA: Diagnosis not present

## 2022-02-22 DIAGNOSIS — Z96653 Presence of artificial knee joint, bilateral: Secondary | ICD-10-CM | POA: Diagnosis not present

## 2022-02-22 DIAGNOSIS — B1920 Unspecified viral hepatitis C without hepatic coma: Secondary | ICD-10-CM | POA: Insufficient documentation

## 2022-02-22 DIAGNOSIS — I10 Essential (primary) hypertension: Secondary | ICD-10-CM | POA: Insufficient documentation

## 2022-02-22 DIAGNOSIS — E119 Type 2 diabetes mellitus without complications: Secondary | ICD-10-CM | POA: Insufficient documentation

## 2022-02-22 DIAGNOSIS — G894 Chronic pain syndrome: Secondary | ICD-10-CM | POA: Diagnosis not present

## 2022-02-22 DIAGNOSIS — T8450XD Infection and inflammatory reaction due to unspecified internal joint prosthesis, subsequent encounter: Secondary | ICD-10-CM

## 2022-02-22 DIAGNOSIS — Z79899 Other long term (current) drug therapy: Secondary | ICD-10-CM | POA: Insufficient documentation

## 2022-02-22 DIAGNOSIS — Z91199 Patient's noncompliance with other medical treatment and regimen due to unspecified reason: Secondary | ICD-10-CM | POA: Diagnosis not present

## 2022-02-22 DIAGNOSIS — T8454XD Infection and inflammatory reaction due to internal left knee prosthesis, subsequent encounter: Secondary | ICD-10-CM | POA: Diagnosis not present

## 2022-02-22 DIAGNOSIS — Z7984 Long term (current) use of oral hypoglycemic drugs: Secondary | ICD-10-CM | POA: Insufficient documentation

## 2022-02-22 DIAGNOSIS — Z7985 Long-term (current) use of injectable non-insulin antidiabetic drugs: Secondary | ICD-10-CM | POA: Insufficient documentation

## 2022-02-22 DIAGNOSIS — Z794 Long term (current) use of insulin: Secondary | ICD-10-CM | POA: Diagnosis not present

## 2022-02-22 MED ORDER — RIFAMPIN 300 MG PO CAPS: 300.0000 mg | ORAL_CAPSULE | Freq: Two times a day (BID) | ORAL | 1 refills | Status: DC

## 2022-02-22 MED ORDER — CEPHALEXIN 500 MG PO CAPS: 1000.0000 mg | ORAL_CAPSULE | Freq: Four times a day (QID) | ORAL | 1 refills | Status: DC

## 2022-02-22 NOTE — Progress Notes (Signed)
The purpose of this virtual visit is to provide medical care while limiting exposure to the novel coronavirus (COVID19) for both patient and office staff.   Consent was obtained for Video visit:  Yes.   Answered questions that patient had about telehealth interaction:  Yes.   I discussed the limitations, risks, security and privacy concerns of performing an evaluation and management service by telephone. I also discussed with the patient that there may be a patient responsible charge related to this service. The patient expressed understanding and agreed to proceed.   Patient Location: work Academic librarian , [place]   Follow up visit for left knee PJI due to [place] is a 67 y.o. 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, has bladder stimulator,spine fusion,  [facility] , complicated left knee replacement X 5 surgeries last he had revision TKA on 07/19/21 for loosening of prosthesis was doing well for 6 weeks until 09/02/21  On 09/11/21 he underwent I/D of left knee with polyethylene exchange and placement of antibiotic beads Culture positive for MSSA He underwent washout and exchange of the femoral component and liner- HE took 6 weeks of  Iv cefazolin  along with rifampin 345m PO BID because of retained hardware HE finished 6 weeks on 10/22/21 and went on  Po antibiotics Keflex + rifampin the plan was to give him for a total of 6 months- [facility] and then came to see me in hjan 2024 with swollen left knee with worsening pain- He was partially adherent to the antibiotic regimen for insignificant  GI symptoms- Pt was asked to go back  on his proper regimen since Jan 10th- keflex 5031mPO q6  + rifampin 30058mID . I spoke with Dr.Hooten and he aspirated the knee on 01/27/22  Latest Reference Range & Units 01/27/22 09:57  Color, Synovial YELLOW  AMBER !  Appearance-Synovial CLEAR  TURBID !   Crystals, Fluid  NO CRYSTALS SEEN  WBC, Synovial 0 - 200 /cu mm 29,089 (H)  Neutrophil, Synovial 0 - 25 % 100 (H)  Lymphocytes-Synovial Fld 0 - 20 % 0  Monocyte-Macrophage-Synovial Fluid 50 - 90 % 0 (L)  Eosinophils-Synovial 0 - 1 % 0   Culture MSSA [facility]- pt wants ons stage procedure and says cannot afford to have 2 stage procedure as he need to work  Pt is seeing me for follow up He is concerned that the knee is fine in the morning but by the end of the day more swollen and painful-   Past Medical History:  Diagnosis Date   Anxiety    Arthritis    Avascular necrosis of bones of both hips (HCC)    Bipolar disorder (HCC)    Bladder spasms    Chronic pain of left knee    Chronic pain syndrome    a.) followed by pain clinic   Cocaine use disorder, moderate, dependence ([facility]CC[facility]8/30/2017   DDD (degenerative disc disease)    Depression    Erectile dysfunction    a.) on PDE5i (sildenafil)   Frequency-urgency syndrome    GERD (gastroesophageal reflux disease)    History of hepatitis B    1983  TX'D--  NO ISSUES OR SYMPTOMS SINCE   Hypotestosteronemia    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[facility]CC[facility]8/29/2017   Nocturia    Polysubstance abuse ([facility]CC[place]  a.) cocaine +  marijuana + BZO + opioids   PONV (postoperative nausea and vomiting)    "difficult to put me to sleep"   Spinal headache    T2DM (type 2 diabetes mellitus) ([place])    Urine incontinence     Past Surgical History:  Procedure Laterality Date   APPLICATION OF WOUND VAC Left 09/11/2021   Procedure: APPLICATION OF WOUND VAC;  Surgeon: [name], MD;  Location: [facility];  Service: Orthopedics;  Laterality: Left;  [id number]    BACK SURGERY     2 rods and 4 screws artificial disc   INTERSTIM IMPLANT PLACEMENT  2007   INTERSTIM IMPLANT REVISION N/A 08/17/2012   Procedure: REPLACMENT OF IPG PLUS REPLACE LEAD OF INTERSTIM IMPLANT ;  Surgeon: [name], MD;  Location: [place];   Service: Urology;  Laterality: N/A;   IRRIGATION AND DEBRIDEMENT KNEE Left 09/11/2021   Procedure: IRRIGATION AND DEBRIDEMENT WITH POLY EXCHANGE LEFT KNEE;  Surgeon: [name], MD;  Location: [facility];  Service: Orthopedics;  Laterality: Left;   JOINT REPLACEMENT     Lt knee and Lt hip   LOWER EXTREMITY ANGIOGRAPHY Left 11/17/2020   Procedure: LOWER EXTREMITY ANGIOGRAPHY;  Surgeon: Katha Cabal, MD;  Location: [place] [facility];  Service: Cardiovascular;  Laterality: Left;   LOWER EXTREMITY ANGIOGRAPHY Right 12/22/2020   Procedure: LOWER EXTREMITY ANGIOGRAPHY;  Surgeon: Katha Cabal, MD;  Location: [place] [facility];  Service: Cardiovascular;  Laterality: Right;   LOWER EXTREMITY ANGIOGRAPHY Left 12/14/2021   Procedure: Lower Extremity Angiography;  Surgeon: Katha Cabal, MD;  Location: [place] [facility];  Service: Cardiovascular;  Laterality: Left;   LUMBAR DISC SURGERY  2005   L5   LUMBAR FUSION  X2  2006  &  2007   OPEN DEBRIDEMENT LEFT TOTAL KNEE (SCAR, BONEY GRAOWTH)/ REMOVAL OLD SUTURES  05-01-2007   PARTIAL HIP ARTHROPLASTY Left 2001   x2   SHOULDER OPEN ROTATOR CUFF REPAIR Left 2000   TOTAL HIP ARTHROPLASTY Left 2002   TOTAL KNEE ARTHROPLASTY Left    PARTIAL LEFT KNEE REPLACEMENT PRIOR TO THIS   TOTAL KNEE REVISION Left 2008   TOTAL KNEE REVISION Left 07/19/2021   Procedure: TOTAL KNEE REVISION;  Surgeon: [name], MD;  Location: [facility];  Service: Orthopedics;  Laterality: Left;   TRIGGER FINGER RELEASE Bilateral    SEVERAL FINGERS   TRIGGER FINGER RELEASE Right 06/18/2014   Procedure: RELEASE TRIGGER FINGER/A-1 PULLEY;  Surgeon: [name], MD;  Location: [facility];  Service: Orthopedics;  Laterality: Right;    Social History   Socioeconomic History   Marital status: Divorced    Spouse name: Not on file   Number of children: 3   Years of education: Not on file   Highest education level: Some college, no degree  Occupational History    Not on file  Tobacco Use   Smoking status: Never   Smokeless tobacco: Never  Vaping Use   Vaping Use: Never used  Substance and Sexual Activity   Alcohol use: Not Currently    Alcohol/week: 0.0 - 2.0 standard drinks of alcohol   Drug use: Not Currently    Types: Cocaine    Comment: pt states its been years since he used cocaine 6 yrs ago   Sexual activity: Not Currently  Other Topics Concern   Not on file  Social History Narrative    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, partime   Social Determinants of  Health   Financial Resource Strain: Low Risk  (01/28/2022)   Overall Financial Resource Strain (CARDIA)    Difficulty of Paying Living Expenses: Not hard at all  Food Insecurity: No Food Insecurity (01/28/2022)   Hunger Vital Sign    Worried About Running Out of Food in the Last Year: Never true    Ran Out of Food in the Last Year: Never true  Transportation Needs: No Transportation Needs (01/28/2022)   PRAPARE - Hydrologist (Medical): No    Lack of Transportation (Non-Medical): No  Physical Activity: Inactive (01/28/2022)   Exercise Vital Sign    Days of Exercise per Week: 0 days    Minutes of Exercise per Session: 0 min  Stress: No Stress Concern Present (01/28/2022)   [place]    Feeling of Stress : Not at all  Social Connections: Moderately Isolated (01/28/2022)   Social Connection and Isolation Panel [NHANES]    Frequency of Communication with Friends and Family: More than three times a week    Frequency of Social Gatherings with Friends and Family: More than three times a week    Attends Religious Services: More than 4 times per year    Active Member of [employer] or Organizations: No    Attends Archivist Meetings: Never    Marital Status: Divorced  Human resources officer Violence: Not At Risk (01/28/2022)   Humiliation, Afraid, Rape, and Kick questionnaire    Fear of Current or Ex-Partner:  No    Emotionally Abused: No    Physically Abused: No    Sexually Abused: No    Family History  Problem Relation Age of Onset   Diabetes Mother    Arthritis Mother    Cancer Mother        lung cancer   Hyperlipidemia Mother    Stroke Mother    Alcohol abuse Father    Cancer Father        lung   Arthritis Father    Diabetes Brother    Allergies  Allergen Reactions   Suboxone [Buprenorphine Hcl-Naloxone Hcl] Nausea And Vomiting   Tape Other (See Comments)    [place] *Paper Tape is ok    Codeine Rash, Itching and Nausea Only   Morphine And Related Itching and Rash   Other Rash    Telemetry electrodes   Silicone Rash    [place] -*Paper Tape is ok    I? Current Outpatient Medications  Medication Sig Dispense Refill   acetaminophen (TYLENOL) 500 MG tablet Take 1,500 mg by mouth 2 (two) times daily as needed for moderate pain or mild pain.     aspirin EC 81 MG tablet Take 81 mg by mouth daily. Swallow whole.     atorvastatin (LIPITOR) 20 MG tablet Take 20 mg by mouth at bedtime. (Patient not taking: Reported on 01/28/2022)     cephALEXin (KEFLEX) 500 MG capsule Take 1 capsule (500 mg total) by mouth 4 (four) times daily. 120 capsule 1   cyclobenzaprine (FLEXERIL) 10 MG tablet SMARTSIG:1 Tablet(s) By Mouth Every 12 Hours     diclofenac (VOLTAREN) 75 MG EC tablet Take 75 mg by mouth 2 (two) times daily.     empagliflozin (JARDIANCE) 25 MG TABS tablet Take 1 tablet (25 mg total) by mouth daily. 90 tablet 1   famotidine (PEPCID) 20 MG tablet Take 1 tablet (20 mg total) by mouth 2 (two) times daily. 15 tablet 0  ferrous sulfate 324 MG TBEC TAKE ONE TABLET BY MOUTH MONDAY,WEDNESDAY,FRIDAY     lidocaine (LIDODERM) 5 % Place 1 patch onto the skin daily. Remove & Discard patch within 12 hours or as directed by MD 30 patch 0   metFORMIN (GLUCOPHAGE) 1000 MG tablet Take 500 mg by mouth 2 (two) times daily.     Multiple Vitamin (MULTIVITAMIN WITH MINERALS) TABS tablet Take 1 tablet by mouth  in the morning.     ondansetron (ZOFRAN) 4 MG tablet Take 1 tablet (4 mg total) by mouth every 8 (eight) hours as needed for nausea or vomiting. 20 tablet 1   oxyCODONE (ROXICODONE) 15 MG immediate release tablet Take 15 mg by mouth every 6 (six) hours.     OZEMPIC, 0.25 OR 0.5 MG/DOSE, 2 MG/3ML SOPN Inject 0.5 mg into the skin once a week.     pantoprazole (PROTONIX) 40 MG tablet Take 1 tablet (40 mg total) by mouth daily. 90 tablet 3   pregabalin (LYRICA) 200 MG capsule Take 1 capsule (200 mg total) by mouth in the morning, at noon, and at bedtime. 90 capsule 0   rifampin (RIFADIN) 300 MG capsule Take 1 capsule (300 mg total) by mouth 2 (two) times daily. 60 capsule 1   terbinafine (LAMISIL) 250 MG tablet Take 1 tablet (250 mg total) by mouth daily. 90 tablet 0   tiZANidine (ZANAFLEX) 4 MG tablet Take 4 mg by mouth 3 (three) times daily as needed.     zolpidem (AMBIEN) 10 MG tablet Take 1 tablet (10 mg total) by mouth at bedtime as needed. for sleep 30 tablet 2   No current facility-administered medications for this visit.     Abtx:  Anti-infectives (From admission, onward)    None       REVIEW OF SYSTEMS:  Const: negative fever, negative chills, negative weight loss Eyes: negative diplopia or visual changes, negative eye pain ENT: negative coryza, negative sore throat Resp: negative cough, hemoptysis, dyspnea Cards: negative for chest pain, palpitations, lower extremity edema GU: negative for frequency, dysuria and hematuria GI: Negative for abdominal pain, diarrhea, bleeding, constipation Skin: negative for rash and pruritus Heme: negative for easy bruising and gum/nose bleeding MS: pain, swelling and stiffness left knee Neurolo:negative for headaches, dizziness, vertigo, memory problems  Psych: negative for feelings of anxiety, depression  Endocrine: negative for thyroid, diabetes Allergy/Immunology- as above ? Objective:  VITALS:  There were no vitals taken for this  visit.  PHYSICAL EXAM: thru video General: Alert, cooperative, no distress,  Head: Normocephalic, without obvious abnormality, atraumatic. Neurologic: Grossly non-focal ? Impression/Recommendation ? ?Left knee PJI with MSSA- s/p ID and exchange of femoral componenet and liner in Sept 2023 Completed  6 weeks of IV cefazin + rifampin on 10/13, followed by  cephalexin 532m Q6 + rifampin 3046mBID for a total of 6 months.. but was non compliant and now with recurrence of MSSA in the knee joint-   worsenign ESR and CRP [facility] I gave the option of going back on IV cefazolin, which he says he cannot as he works So will increase keflex to 1 gram PO Q6 and continue rifampin 30093mID He has an appt with [facility] March 1 st week and then will get back to me reagrding the surgical plan HE will need IV then   ESR 140> 68 > 94 CRP 4.4> 7.4 ( equivalent- 74) Follow ESR/CRP   Treated HEPC- H/o substance use - clean  for 5 years      DM on metformin and ozempic   HTN on atenolol and clonidine   HLD on atorvastatin   ? ___________________________________________________ Discussed with patient, in detail Discussed with Dr.Hooten Follow up after [facility] appt Ttal time spent on this visit 15 min

## 2022-02-23 ENCOUNTER — Telehealth: Payer: Self-pay | Admitting: Family Medicine

## 2022-02-23 NOTE — Telephone Encounter (Signed)
We will have to renew patient assistance for 2024. Amy - please work on Fish farm manager for [employer] and let patient know we are working on it.

## 2022-02-23 NOTE — Telephone Encounter (Signed)
Patient would like to know since it is a new year do he have to fill out any paper work to receive his ozempic this year? He said that he have 1 month left and would like to know will it automatically be sent to [place] for him to pick up or would he have to do the paperwork?

## 2022-03-02 ENCOUNTER — Telehealth: Payer: Self-pay

## 2022-03-02 NOTE — Progress Notes (Signed)
Care Management & Coordination Services Pharmacy Team  Reason for Encounter: Patient assistance renewal   Patient is currently enrolled in [place] patient assistance program for the medication Ozempic. Patient would like to re-enroll in patient assistance for the next calendar year.  The application was downloaded on behalf of the patient. Forms were forwarded to clinical pharmacist for review and to determine next steps.  Charlene Brooke, PharmD notified  Marijean Niemann, [place] Clinical Pharmacy Assistant [phone]

## 2022-03-02 NOTE — Telephone Encounter (Signed)
Completed online application for Ozempic renewal. Will await determination from [employer].

## 2022-03-02 NOTE — Telephone Encounter (Cosign Needed)
Ozempic renewal PAP has been completed.  Charlene Brooke, PharmD notified  Marijean Niemann, [place] Clinical Pharmacy Assistant [phone]

## 2022-03-03 DIAGNOSIS — G890 Central pain syndrome: Secondary | ICD-10-CM | POA: Diagnosis not present

## 2022-03-03 DIAGNOSIS — M4802 Spinal stenosis, cervical region: Secondary | ICD-10-CM | POA: Diagnosis not present

## 2022-03-03 DIAGNOSIS — M160 Bilateral primary osteoarthritis of hip: Secondary | ICD-10-CM | POA: Diagnosis not present

## 2022-03-03 DIAGNOSIS — Z981 Arthrodesis status: Secondary | ICD-10-CM | POA: Diagnosis not present

## 2022-03-03 DIAGNOSIS — G894 Chronic pain syndrome: Secondary | ICD-10-CM | POA: Diagnosis not present

## 2022-03-03 DIAGNOSIS — M25512 Pain in left shoulder: Secondary | ICD-10-CM | POA: Diagnosis not present

## 2022-03-03 DIAGNOSIS — M5136 Other intervertebral disc degeneration, lumbar region: Secondary | ICD-10-CM | POA: Diagnosis not present

## 2022-03-03 DIAGNOSIS — Z79891 Long term (current) use of opiate analgesic: Secondary | ICD-10-CM | POA: Diagnosis not present

## 2022-03-03 DIAGNOSIS — M545 Low back pain, unspecified: Secondary | ICD-10-CM | POA: Diagnosis not present

## 2022-03-03 NOTE — Telephone Encounter (Cosign Needed)
Spoke with [employer]. They have received patient's application. It is in the benefit verification process now. [employer] will call patient within 48 hours for verification and approval/denial.  I called patient to inform him and to advise him to answer the call.   Charlene Brooke, PharmD notified  Marijean Niemann, [place] Clinical Pharmacy Assistant [phone]

## 2022-03-04 ENCOUNTER — Telehealth: Payer: Self-pay

## 2022-03-04 DIAGNOSIS — M00062 Staphylococcal arthritis, left knee: Secondary | ICD-10-CM

## 2022-03-04 MED ORDER — RIFAMPIN 300 MG PO CAPS: 300.0000 mg | ORAL_CAPSULE | Freq: Two times a day (BID) | ORAL | 1 refills | Status: DC

## 2022-03-04 MED ORDER — CEPHALEXIN 500 MG PO CAPS: 1000.0000 mg | ORAL_CAPSULE | Freq: Four times a day (QID) | ORAL | 1 refills | Status: DC

## 2022-03-04 NOTE — Telephone Encounter (Signed)
Received voicemail from patient requesting that [facility] [address]. Called patient to let him know they were sent to [facility], but that we will [facility], no answer. Left HIPAA compliant voicemail stating medications would be sent to preferred pharmacy.   Called [facility] and cancelled rifampin and cephalexin. [facility]'s request.   Beryle Flock, RN

## 2022-03-11 DIAGNOSIS — Z1321 Encounter for screening for nutritional disorder: Secondary | ICD-10-CM | POA: Diagnosis not present

## 2022-03-11 DIAGNOSIS — I739 Peripheral vascular disease, unspecified: Secondary | ICD-10-CM | POA: Diagnosis not present

## 2022-03-11 DIAGNOSIS — T8459XD Infection and inflammatory reaction due to other internal joint prosthesis, subsequent encounter: Secondary | ICD-10-CM | POA: Diagnosis not present

## 2022-03-11 DIAGNOSIS — Z96642 Presence of left artificial hip joint: Secondary | ICD-10-CM | POA: Diagnosis not present

## 2022-03-11 DIAGNOSIS — Z96659 Presence of unspecified artificial knee joint: Secondary | ICD-10-CM | POA: Diagnosis not present

## 2022-03-11 DIAGNOSIS — A4901 Methicillin susceptible Staphylococcus aureus infection, unspecified site: Secondary | ICD-10-CM | POA: Diagnosis not present

## 2022-03-11 DIAGNOSIS — Z96652 Presence of left artificial knee joint: Secondary | ICD-10-CM | POA: Diagnosis not present

## 2022-03-11 DIAGNOSIS — Z79899 Other long term (current) drug therapy: Secondary | ICD-10-CM | POA: Diagnosis not present

## 2022-03-11 DIAGNOSIS — Z7982 Long term (current) use of aspirin: Secondary | ICD-10-CM | POA: Diagnosis not present

## 2022-03-11 DIAGNOSIS — E119 Type 2 diabetes mellitus without complications: Secondary | ICD-10-CM | POA: Diagnosis not present

## 2022-03-11 DIAGNOSIS — T8454XA Infection and inflammatory reaction due to internal left knee prosthesis, initial encounter: Secondary | ICD-10-CM | POA: Diagnosis not present

## 2022-03-11 DIAGNOSIS — Z7984 Long term (current) use of oral hypoglycemic drugs: Secondary | ICD-10-CM | POA: Diagnosis not present

## 2022-03-11 DIAGNOSIS — R5081 Fever presenting with conditions classified elsewhere: Secondary | ICD-10-CM | POA: Diagnosis not present

## 2022-03-11 DIAGNOSIS — M25851 Other specified joint disorders, right hip: Secondary | ICD-10-CM | POA: Diagnosis not present

## 2022-03-11 DIAGNOSIS — Z95820 Peripheral vascular angioplasty status with implants and grafts: Secondary | ICD-10-CM | POA: Diagnosis not present

## 2022-03-16 ENCOUNTER — Ambulatory Visit
Admit: 2022-03-16 | Discharge: 2022-03-16 | Disposition: A | Discharge status: Home / Self Care | Payer: PRIVATE HEALTH INSURANCE

## 2022-03-16 ENCOUNTER — Telehealth: Payer: Self-pay | Admitting: *Deleted

## 2022-03-16 DIAGNOSIS — M25462 Effusion, left knee: Principal | ICD-10-CM

## 2022-03-16 DIAGNOSIS — Z96652 Presence of left artificial knee joint: Principal | ICD-10-CM

## 2022-03-16 DIAGNOSIS — M25562 Pain in left knee: Principal | ICD-10-CM

## 2022-03-16 DIAGNOSIS — M25519 Pain in unspecified shoulder: Secondary | ICD-10-CM | POA: Diagnosis not present

## 2022-03-16 DIAGNOSIS — Z981 Arthrodesis status: Secondary | ICD-10-CM | POA: Diagnosis not present

## 2022-03-16 DIAGNOSIS — J449 Chronic obstructive pulmonary disease, unspecified: Secondary | ICD-10-CM | POA: Diagnosis not present

## 2022-03-16 DIAGNOSIS — Z95820 Peripheral vascular angioplasty status with implants and grafts: Secondary | ICD-10-CM | POA: Diagnosis not present

## 2022-03-16 DIAGNOSIS — T8454XA Infection and inflammatory reaction due to internal left knee prosthesis, initial encounter: Secondary | ICD-10-CM | POA: Diagnosis not present

## 2022-03-16 DIAGNOSIS — A4901 Methicillin susceptible Staphylococcus aureus infection, unspecified site: Secondary | ICD-10-CM | POA: Diagnosis not present

## 2022-03-16 DIAGNOSIS — M7989 Other specified soft tissue disorders: Secondary | ICD-10-CM | POA: Diagnosis not present

## 2022-03-16 DIAGNOSIS — E785 Hyperlipidemia, unspecified: Secondary | ICD-10-CM | POA: Diagnosis not present

## 2022-03-16 DIAGNOSIS — Z79899 Other long term (current) drug therapy: Secondary | ICD-10-CM | POA: Diagnosis not present

## 2022-03-16 DIAGNOSIS — Z8619 Personal history of other infectious and parasitic diseases: Secondary | ICD-10-CM | POA: Diagnosis not present

## 2022-03-16 DIAGNOSIS — F319 Bipolar disorder, unspecified: Secondary | ICD-10-CM | POA: Diagnosis not present

## 2022-03-16 DIAGNOSIS — T8459XD Infection and inflammatory reaction due to other internal joint prosthesis, subsequent encounter: Secondary | ICD-10-CM | POA: Diagnosis not present

## 2022-03-16 DIAGNOSIS — Z96642 Presence of left artificial hip joint: Secondary | ICD-10-CM | POA: Diagnosis not present

## 2022-03-16 DIAGNOSIS — I10 Essential (primary) hypertension: Secondary | ICD-10-CM | POA: Diagnosis not present

## 2022-03-16 DIAGNOSIS — Z885 Allergy status to narcotic agent status: Secondary | ICD-10-CM | POA: Diagnosis not present

## 2022-03-16 DIAGNOSIS — E1151 Type 2 diabetes mellitus with diabetic peripheral angiopathy without gangrene: Secondary | ICD-10-CM | POA: Diagnosis not present

## 2022-03-16 DIAGNOSIS — Z7984 Long term (current) use of oral hypoglycemic drugs: Secondary | ICD-10-CM | POA: Diagnosis not present

## 2022-03-16 DIAGNOSIS — Z9682 Presence of neurostimulator: Secondary | ICD-10-CM | POA: Diagnosis not present

## 2022-03-16 DIAGNOSIS — Z96659 Presence of unspecified artificial knee joint: Secondary | ICD-10-CM | POA: Diagnosis not present

## 2022-03-16 DIAGNOSIS — I2510 Atherosclerotic heart disease of native coronary artery without angina pectoris: Secondary | ICD-10-CM | POA: Diagnosis not present

## 2022-03-16 DIAGNOSIS — G8929 Other chronic pain: Secondary | ICD-10-CM | POA: Diagnosis not present

## 2022-03-16 NOTE — Telephone Encounter (Signed)
Received from [employer] PAP:  Ozempic 0.25/0.5 mg/3 ml x 5 pens.  Lot# [id number] Exp: 10/10/2023.  Left message for Erik Martinez via telephone that medication is here at the office ready to be picked up.

## 2022-03-17 ENCOUNTER — Telehealth: Payer: Self-pay

## 2022-03-17 NOTE — Transitions of Care (Post Inpatient/ED Visit) (Signed)
   03/17/2022  Name: Erik Martinez MRN: [id number] DOB: [birth year]  Today's TOC FU Call Status: Today's TOC FU Call Status:: Unsuccessul Call (1st Attempt) Unsuccessful Call (1st Attempt) Date: 03/17/22  Attempted to reach the patient regarding the most recent Inpatient/ED visit.  Follow Up Plan: Additional outreach attempts will be made to reach the patient to complete the Transitions of Care (Post Inpatient/ED visit) call.   [place] LPN [place] Advisor Direct Dial [phone]

## 2022-03-18 ENCOUNTER — Other Ambulatory Visit: Payer: Self-pay | Admitting: Family Medicine

## 2022-03-18 DIAGNOSIS — G4700 Insomnia, unspecified: Secondary | ICD-10-CM

## 2022-03-18 MED ORDER — TERBINAFINE HCL 250 MG PO TABS: 250.0000 mg | ORAL_TABLET | Freq: Every day | ORAL | 0 refills | Status: DC

## 2022-03-18 MED ORDER — ZOLPIDEM TARTRATE 10 MG PO TABS: 10.0000 mg | ORAL_TABLET | Freq: Every evening | ORAL | 2 refills | Status: DC | PRN

## 2022-03-18 NOTE — Telephone Encounter (Signed)
Last office visit 10/15/2021 for DM and Peripheral Neuropathy.  Last refilled zolpidem 01/14/22 for #30 with 2 refills.  Lamisil 11/26/2021 for #90 with no refills.  Next Appt: 04/19/22 for DM.

## 2022-03-18 NOTE — Telephone Encounter (Signed)
Prescription Request  03/18/2022  LOV: 10/15/2021  What is the name of the medication or equipment? zolpidem (AMBIEN) 10 MG tablet  terbinafine (LAMISIL) 250 MG tablet   Have you contacted your pharmacy to request a refill? No   Which pharmacy would you like this sent to?  [facility] ([address]), [place] - [address] [place] [address] [place] [address] Phone: [phone] Fax: [phone]      Patient notified that their request is being sent to the clinical staff for review and that they should receive a response within 2 business days.   Please advise at [place]

## 2022-03-18 NOTE — Transitions of Care (Post Inpatient/ED Visit) (Unsigned)
   03/18/2022  Name: Erik Martinez MRN: [id number] DOB: [birth year] Patient aware if no call from Dr. Marry Guan will let us know and we can help with that.  Today's TOC FU Call Status: Today's TOC FU Call Status:: Successful TOC FU Call Competed Unsuccessful Call (1st Attempt) Date: 03/17/22 [facility] FU Call Complete Date: 03/18/22  Transition Care Management Follow-up Telephone Call Date of Discharge: 03/16/22 Discharge Facility: Other [facility], [facility] buildings) Name of Other ([facility]) Discharge Facility: [facility] Type of Discharge: Emergency Department Reason for ED Visit: Other: (Knee Swelling) How have you been since you were released from the hospital?: Worse Any questions or concerns?: No  Items Reviewed: Did you receive and understand the discharge instructions provided?: Yes Medications obtained and verified?: Yes (Medications Reviewed) Any new allergies since your discharge?: No Dietary orders reviewed?: No Do you have support at home?: Yes People in Home: friend(s)  Home Care and Equipment/Supplies: [place] Ordered?: NA Any new equipment or medical supplies ordered?: NA  Functional Questionnaire: Do you need assistance with bathing/showering or dressing?: No Do you need assistance with meal preparation?: No Do you need assistance with eating?: No Do you have difficulty maintaining continence: No Do you need assistance with getting out of bed/getting out of a chair/moving?: No Do you have difficulty managing or taking your medications?: No  Folllow up appointments reviewed: PCP Follow-up appointment confirmed?: Yes Date of PCP follow-up appointment?: 04/19/22 Follow-up Provider: [facility] Follow-up appointment confirmed?: No Date of Specialist follow-up appointment?:  (Patient will be getting call today to set up. If no call will let us know) Follow-Up Specialty Provider:: Dr. Marry Guan Reason Specialist Follow-Up Not Confirmed: Patient has Specialist  Provider Number and will Call for Appointment Do you need transportation to your follow-up appointment?: No Do you understand care options if your condition(s) worsen?: Yes-patient verbalized understanding    [place], CMA

## 2022-03-18 NOTE — Telephone Encounter (Signed)
Called patient regarding TCM call. He was made aware that meds are here and will come by today to pick up.

## 2022-03-18 NOTE — Telephone Encounter (Signed)
Noted  

## 2022-03-21 NOTE — Transitions of Care (Post Inpatient/ED Visit) (Signed)
   03/21/2022  Name: Erik Martinez MRN: [id number] DOB: May 12, 1955  Today's TOC FU Call Status: Today's TOC FU Call Status:: Successful TOC FU Call Competed Unsuccessful Call (1st Attempt) Date: 03/17/22 [facility] FU Call Complete Date: 03/21/22  Transition Care Management Follow-up Telephone Call Date of Discharge: 03/16/22 Discharge Facility: Other ([place]) Name of Other ([facility]) Discharge Facility: [facility] Type of Discharge: Emergency Department Reason for ED Visit: Other: How have you been since you were released from the hospital?: Better Any questions or concerns?: No  Items Reviewed: Did you receive and understand the discharge instructions provided?: Yes Medications obtained and verified?: Yes (Medications Reviewed) Any new allergies since your discharge?: No Dietary orders reviewed?: NA Do you have support at home?: Yes People in Home: friend(s)  Home Care and Equipment/Supplies: [place] Ordered?: No Any new equipment or medical supplies ordered?: No  Functional Questionnaire: Do you need assistance with bathing/showering or dressing?: No Do you need assistance with meal preparation?: No Do you need assistance with eating?: No Do you have difficulty maintaining continence: No Do you need assistance with getting out of bed/getting out of a chair/moving?: No Do you have difficulty managing or taking your medications?: No  Folllow up appointments reviewed: PCP Follow-up appointment confirmed?: No (refused) MD Provider Line Number:[phone] Given: Yes Date of PCP follow-up appointment?: 04/19/22 Follow-up Provider: [facility] Follow-up appointment confirmed?: No Date of Specialist follow-up appointment?:  (Patient will be getting call today to set up. If no call will let us know) Follow-Up Specialty Provider:: Dr. Marry Guan Reason Specialist Follow-Up Not Confirmed: Patient has Specialist Provider Number and will Call for  Appointment Do you need transportation to your follow-up appointment?: No Do you understand care options if your condition(s) worsen?: Yes-patient verbalized understanding    [place] Direct Dial [phone]

## 2022-03-25 DIAGNOSIS — Z96652 Presence of left artificial knee joint: Secondary | ICD-10-CM | POA: Diagnosis not present

## 2022-03-25 DIAGNOSIS — T8459XD Infection and inflammatory reaction due to other internal joint prosthesis, subsequent encounter: Secondary | ICD-10-CM | POA: Diagnosis not present

## 2022-03-27 NOTE — H&P (Signed)
ORTHOPAEDIC HISTORY & PHYSICAL Oluwaferanmi Wain, Florinda Marker., MD - 03/25/2022 3:30 PM EDT Formatting of this note is different from the original. Images from the original note were not included. 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 male who returns today for reevaluation of his left knee. He is almost 8 months status post left total knee revision arthroplasty and 6 months status post irrigation & debridement with polyethylene exchange. Cultures demonstrated MSSA. He was treated with 6 weeks of IV cefazolin and rifampin. He was to continue keflex as per Dr. Delaine Lame but interrupted the antibiotics due to GI upset. He subsequently developed some pain and swelling of the knee. The Keflex was restarted but his symptoms have persisted. The knee was aspirated by Dr. Candelaria Stagers on 01/27/2022 with cultures again consistent with MSSA. We previously had a discussion about a 2 stage procedure, but the patient wanted to investigate the possibility of a 1 stage procedure. I contacted [facility] and was informed that he would not be a candidate for 1 stage procedure based on their criteria. I subsequently referred him to the [facility] where he underwent evaluation last week. His case was presented at the [facility] Conference and recommendations were made for a two-stage procedure. Unfortunately, there has been miscommunication between [facility] [employer] and the patient with regards to scheduling surgery. He was under the impression that he would be scheduled for surgery at [facility] next Wednesday. However, he was told that the surgeon and OR suite was not available for that day. I have received communication that no one from the Orthopedic Department is available at [facility] for the next 2 to 3 weeks.  The patient has noted  some progressive swelling to the medial aspect of the knee with some apparent weeping. He denies any fevers or chills. He denies any gross purulent drainage. He does report significant pain to the knee. He has an appointment with his pain management physician at [facility] (phone [phone]) in [place] on 3/19..  Medications: Current Outpatient Medications Medication Sig Dispense Refill aspirin 81 MG EC tablet Take 81 mg by mouth once daily atorvastatin (LIPITOR) 40 MG tablet Take 40 mg by mouth once daily AT BEDTIME FOR HIGH CHOLESTEROL celecoxib (CELEBREX) 200 MG capsule Take 1 capsule (200 mg total) by mouth 2 (two) times daily 60 capsule 0 cephalexin (KEFLEX) 500 MG capsule Take 1,000 mg by mouth 4 (four) times daily cyclobenzaprine (FLEXERIL) 10 MG tablet Take 10 mg by mouth every 12 (twelve) hours ferrous sulfate 324 mg (65 mg iron) EC tablet TAKE ONE TABLET BY MOUTH MONDAY,WEDNESDAY,FRIDAY JARDIANCE 25 mg tablet Take 25 mg by mouth once daily metFORMIN (GLUCOPHAGE) 1000 MG tablet Take 1 tablet (1,000 mg total) by mouth 2 (two) times daily with meals. 180 tablet 1 omeprazole (PRILOSEC) 20 MG DR capsule Take 20 mg by mouth once daily oxyCODONE (ROXICODONE) 15 MG immediate release tablet Take 15 mg by mouth every 6 (six) hours as needed OZEMPIC 0.25 mg or 0.5 mg(2 mg/1.5 mL) pen injector INJECT 0.25MG  SUBCUTANEOUSLY ONCE A WEEK FOR 28 DAYS, THEN INCREASE TO 0.5MG  ONCE A WEEK. FOR REFILLS CALL IN RX# [id number]. pantoprazole (PROTONIX) 40 MG DR tablet Take 40 mg by mouth once daily FOR HEARTBURN pregabalin (LYRICA) 200 MG capsule Take 200 mg by mouth 3 (three) times daily tadalafiL (CIALIS) 20 MG  tablet Take 20 mg by mouth once daily terbinafine HCL (LAMISIL) 250 mg tablet Take 250 mg by mouth once daily zolpidem (AMBIEN) 10 mg tablet Take 1 tablet (10 mg total) by mouth at bedtime  No current facility-administered medications for this visit.  Allergies: Allergies Allergen  Reactions Buprenorphine-Naloxone Hives and Nausea And Vomiting Adhesive Tape-Silicones Rash Whelps -*Paper Tape is ok  Codeine Itching, Nausea And Vomiting and Rash Morphine Itching, Nausea And Vomiting and Rash Silicone Rash Whelps -*Paper Tape is ok  Past Medical History: Past Medical History: Diagnosis Date CAD (coronary artery disease) Chronic pain COPD (chronic obstructive pulmonary disease) (CMS-HCC) Depression Diabetes mellitus type 2, uncomplicated (CMS-HCC) Fatty liver GERD (gastroesophageal reflux disease) Hepatitis C History of chicken pox History of stroke Insomnia Vascular abnormality  Past Surgical History: Past Surgical History: Procedure Laterality Date ARTHROSCOPIC ROTATOR CUFF REPAIR 1999 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 Gerrit Heck Left knee unicompartmental arthroplasty 06/18/2002 Dr. Gerrit Heck Manipulation under anesthesia - left knee 09/10/2002 Dr. Gerrit Heck Left knee arthroscopy 2005 Revision of unicompartmental arthroplasty to left total knee arthroplasty replacement 04/08/2003 Dr. Gerrit Heck INCISION TENDON SHEATH FOR TRIGGER FINGER Left 09/17/2003 Left long finger INCISION TENDON SHEATH FOR TRIGGER FINGER Right 09/26/2003 Right long finger COLONOSCOPY 02/16/2006 DR. SIEGEL - WNL REPEAT IN 10 YRS 2018 Open debridement of the left total knee including removal of old sutures and a significant scar debridement and debridement of bony overgrowth 05/01/2007 Dr Alvan Dame Revision left total knee replacement, exchange all components 08/13/2009 Dr Lenox Ahr Right index and ring trigger finger release Right 06/18/2014 Dr Marry Guan Left knee revision arthroplasty (femoral and polyethylene components) Extensive synovectomy and excision of fibrotic tissue from the left knee 07/19/2021 Dr Marry Guan Irrigation and debridement of the left knee, polyethylene exchange, and placement of antibiotic impregnated Stimulan beads  09/11/2021 Dr Marry Guan bladder stimulator Lumbar disc replacement at L5-S1 Dr. Gerrit Heck Lumbar interbody fusion at L4-5 Dr. 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  Exam BP 126/78  Ht 177.8 cm (5\' 10" )  Wt 84.6 kg (186 lb 6.4 oz)  BMI 26.75 kg/m  General: Well-developed, well-nourished male seen in no acute distress. Antalgic gait.  HEENT: Atraumatic, normocephalic. Pupils are equal and reactive to light. Extraocular motion is intact. Sclera are clear. Oropharynx is clear with moist mucosa.  Neck: Supple, nontender, and with good ROM. No thyromegaly, adenopathy, JVD, or carotid bruits.  Lungs: Clear to auscultation bilaterally.  Cardiovascular: Regular rate and rhythm. Normal S1, S2. No murmur . No appreciable gallops or rubs. Peripheral pulses are palpable. No lower extremity edema. Homan`s test is negative.  Abdomen: Soft, nontender, nondistended. Bowel sounds are present.  Extremities: Good strength, stability, and range of motion of the upper extremities. Good range of motion of the hips and ankles.  Left Knee: Soft tissue swelling: moderate Effusion: moderate Erythema: none Crepitance:  none Tenderness: Diffuse tenderness  to the left knee Alignment: normal Mediolateral laxity: stable Posterior sag: negative Patellar tracking: Good tracking without evidence of subluxation or tilt Atrophy: No significant atrophy. Quadriceps tone was fair to good. Range of motion: 0/20/95 degrees  Neurologic: Awake, alert, and oriented. Sensory function is intact to pinprick and light touch. Motor strength is judged to be 5/5. Motor coordination is within normal limits. No apparent clonus. No tremor.  X-rays: I ordered and interpreted standing AP, lateral, and sunrise views of the left knee that were obtained in the office today. Good position of the total knee implants. The femoral component consists of a stem and sleeve. Good alignment is noted on the AP view. Good cement mantle is appreciated without gross evidence of loosening. No evidence of polyethylene wear or osteolysis. No evidence of fracture or dislocation. A vascular stent is present posteriorly.  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 I again had a lengthy discussion with the patient regarding the details of a two-stage procedure. The initial stage would involve removal of the implants, debridement, and placement of antibiotic impregnated cement spacers. This will be followed by a 6-week course of IV antibiotics followed by a 2-week holiday from the antibiotics prior to the second stage. The second stage will involve removal of the cement spacer and repeat intraoperative debridement with an attempt to reimplant the total knee components. I will be coordinating his care with Dr. Delaine Lame (Infectious Disease). We discussed the risks and benefits of surgical intervention. The usual perioperative course was also discussed in detail. The patient expressed understanding of the risks and benefits of surgical  intervention and would like to proceed with plans for the first stage.  I spent a total of 45 minutes in both face-to-face and non-face-to-face activities, excluding procedures performed, for this visit on the date of this encounter.  MEDICAL CLEARANCE: Per anesthesiology. ACTIVITY: As tolerated. WORK STATUS: I anticipate the patient will be out of work for a minimum of 8 to 12 weeks. THERAPY: Preoperative physical therapy evaluation. MEDICATIONS: Requested Prescriptions  No prescriptions requested or ordered in this encounter  FOLLOW-UP: Return for postoperative follow-up.  Solveig Fangman P. Holley Bouche., M.D.  This note was generated in part with voice recognition software and I apologize for any typographical errors that were not detected and corrected.  Electronically signed by Lamar Benes., MD at 03/27/2022 4:37 PM EDT

## 2022-03-27 NOTE — Discharge Instructions (Signed)
  Instructions after Total Knee Replacement   Rajesh Wyss P. Holley Bouche., M.D.     Dept. of [facility]  [address], [place]  [address]  Phone: [phone]   Fax: [phone]    DIET: Drink plenty of non-alcoholic fluids. Resume your normal diet. Include foods high in fiber.  ACTIVITY:  You may use crutches or a walker with touch down weight-bearing, unless instructed otherwise. You may be weaned off of the walker or crutches by your Physical Therapist.  Do NOT place pillows under the knee. Anything placed under the knee could limit your ability to straighten the knee.   Continue doing gentle exercises. Exercising will reduce the pain and swelling, increase motion, and prevent muscle weakness.   Please continue to use the TED compression stockings for 6 weeks. You may remove the stockings at night, but should reapply them in the morning. Do not drive or operate any equipment until instructed.  WOUND CARE:  Continue to use the PolarCare or ice packs periodically to reduce pain and swelling. You may bathe or shower after the staples are removed at the first office visit following surgery.  MEDICATIONS: You may resume your regular medications. Please take the pain medication as prescribed on the medication. Do not take pain medication on an empty stomach. You have been given a prescription for a blood thinner (Lovenox or Coumadin). Please take the medication as instructed. (NOTE: After completing a 2 week course of Lovenox, take one Enteric-coated aspirin once a day. This along with elevation will help reduce the possibility of phlebitis in your operated leg.) Do not drive or drink alcoholic beverages when taking pain medications.  CALL THE OFFICE FOR: Temperature above 101 degrees Excessive bleeding or drainage on the dressing. Excessive swelling, coldness, or paleness of the toes. Persistent nausea and vomiting.  FOLLOW-UP:  You  should have an appointment to return to the office in 10-14 days after surgery. Arrangements have been made for continuation of Physical Therapy (either home therapy or outpatient therapy).

## 2022-03-28 ENCOUNTER — Encounter
Admission: RE | Admit: 2022-03-28 | Discharge: 2022-03-28 | Disposition: A | Discharge status: Home / Self Care | Payer: PPO | Source: Ambulatory Visit | Attending: Orthopedic Surgery | Admitting: Orthopedic Surgery

## 2022-03-28 VITALS — Ht 70.0 in | Wt 190.0 lb

## 2022-03-28 DIAGNOSIS — L24A9 Irritant contact dermatitis due friction or contact with other specified body fluids: Secondary | ICD-10-CM | POA: Diagnosis not present

## 2022-03-28 DIAGNOSIS — Z01812 Encounter for preprocedural laboratory examination: Secondary | ICD-10-CM

## 2022-03-28 DIAGNOSIS — M009 Pyogenic arthritis, unspecified: Secondary | ICD-10-CM | POA: Insufficient documentation

## 2022-03-28 DIAGNOSIS — Z01818 Encounter for other preprocedural examination: Secondary | ICD-10-CM | POA: Insufficient documentation

## 2022-03-28 DIAGNOSIS — E1142 Type 2 diabetes mellitus with diabetic polyneuropathy: Secondary | ICD-10-CM | POA: Diagnosis not present

## 2022-03-28 HISTORY — DX: Hyperlipidemia, unspecified: E78.5

## 2022-03-28 HISTORY — DX: Peripheral vascular disease, unspecified: I73.9

## 2022-03-28 HISTORY — DX: Neuromuscular dysfunction of bladder, unspecified: N31.9

## 2022-03-28 LAB — COMPREHENSIVE METABOLIC PANEL
ALT: 15 U/L (ref 0–44)
AST: 17 U/L (ref 15–41)
Albumin: 3.3 g/dL — ABNORMAL LOW (ref 3.5–5.0)
Alkaline Phosphatase: 87 U/L (ref 38–126)
Anion gap: 12 (ref 5–15)
BUN: 17 mg/dL (ref 8–23)
CO2: 25 mmol/L (ref 22–32)
Calcium: 9 mg/dL (ref 8.9–10.3)
Chloride: 101 mmol/L (ref 98–111)
Creatinine, Ser: 1.19 mg/dL (ref 0.61–1.24)
GFR, Estimated: >60 mL/min (ref >60)
Glucose, Bld: 114 mg/dL — ABNORMAL HIGH (ref 70–99)
Potassium: 4.4 mmol/L (ref 3.5–5.1)
Sodium: 138 mmol/L (ref 135–145)
Total Bilirubin: 0.4 mg/dL (ref 0.3–1.2)
Total Protein: 8.1 g/dL (ref 6.5–8.1)

## 2022-03-28 LAB — URINALYSIS, ROUTINE W REFLEX MICROSCOPIC
Bacteria, UA: NONE SEEN
Bilirubin Urine: NEGATIVE
Glucose, UA: >=500 mg/dL — AB
Hgb urine dipstick: NEGATIVE
Ketones, ur: NEGATIVE mg/dL
Leukocytes,Ua: NEGATIVE
Nitrite: NEGATIVE
Protein, ur: 30 mg/dL — AB
Specific Gravity, Urine: 1.026 (ref 1.005–1.030)
pH: 5 (ref 5.0–8.0)

## 2022-03-28 LAB — SEDIMENTATION RATE: Sed Rate: 124 mm/hr — ABNORMAL HIGH (ref 0–20)

## 2022-03-28 LAB — CBC WITH DIFFERENTIAL/PLATELET
Abs Immature Granulocytes: 0.02 10*3/uL (ref 0.00–0.07)
Basophils Absolute: 0 10*3/uL (ref 0.0–0.1)
Basophils Relative: 0 %
Eosinophils Absolute: 0.2 10*3/uL (ref 0.0–0.5)
Eosinophils Relative: 3 %
HCT: 33.3 % — ABNORMAL LOW (ref 39.0–52.0)
Hemoglobin: 10.5 g/dL — ABNORMAL LOW (ref 13.0–17.0)
Immature Granulocytes: 0 %
Lymphocytes Relative: 23 %
Lymphs Abs: 1.2 10*3/uL (ref 0.7–4.0)
MCH: 24.6 pg — ABNORMAL LOW (ref 26.0–34.0)
MCHC: 31.5 g/dL (ref 30.0–36.0)
MCV: 78 fL — ABNORMAL LOW (ref 80.0–100.0)
Monocytes Absolute: 0.4 10*3/uL (ref 0.1–1.0)
Monocytes Relative: 8 %
Neutro Abs: 3.4 10*3/uL (ref 1.7–7.7)
Neutrophils Relative %: 66 %
Platelets: 268 10*3/uL (ref 150–400)
RBC: 4.27 MIL/uL (ref 4.22–5.81)
RDW: 15.9 % — ABNORMAL HIGH (ref 11.5–15.5)
WBC: 5.2 10*3/uL (ref 4.0–10.5)
nRBC: 0 % (ref 0.0–0.2)

## 2022-03-28 LAB — C-REACTIVE PROTEIN: CRP: 11.1 mg/dL — ABNORMAL HIGH (ref <1.0)

## 2022-03-28 LAB — HEMOGLOBIN A1C
Hgb A1c MFr Bld: 7.1 % — ABNORMAL HIGH (ref 4.8–5.6)
Mean Plasma Glucose: 157 mg/dL

## 2022-03-28 LAB — SURGICAL PCR SCREEN
MRSA, PCR: NEGATIVE
Staphylococcus aureus: NEGATIVE

## 2022-03-28 NOTE — Patient Instructions (Signed)
Your procedure is scheduled on: Friday, March 22 Report to the Registration Desk on the 1st floor of the [facility]. To find out your arrival time, please call [phone] between 1PM - 3PM on: Thursday, March 21 If your arrival time is 6:00 am, do not arrive before that time as the [facility] entrance doors do not open until 6:00 am.  REMEMBER: Instructions that are not followed completely may result in serious medical risk, up to and including death; or upon the discretion of your surgeon and anesthesiologist your surgery may need to be rescheduled.  Do not eat food after midnight the night before surgery.  No gum chewing or hard candies.  You may however, drink water up to 2 hours before you are scheduled to arrive for your surgery. Do not drink anything within 2 hours of your scheduled arrival time.  In addition, your doctor has ordered for you to drink the provided:  Gatorade G2 Drinking this carbohydrate drink up to two hours before surgery helps to reduce insulin resistance and improve patient outcomes. Please complete drinking 2 hours before scheduled arrival time.  One week prior to surgery: starting today, March 18 Stop aspirin and Anti-inflammatories (NSAIDS) such as Advil, Aleve, Ibuprofen, Motrin, Naproxen, Naprosyn and Aspirin based products such as Excedrin, Goody's Powder, BC Powder. Stop ANY OVER THE COUNTER supplements until after surgery. Stop multiple vitamin,  You may however, continue to take Tylenol if needed for pain up until the day of surgery.  Continue taking all prescribed medications with the exception of the following:  Metformin - hold 2 days before surgery. Last day to take metformin is Tuesday, March 19. Resume AFTER surgery.  Jardiance - hold 3 days before surgery. Last day to take jardiance is Monday, March 18. Resume AFTER surgery.  Ozempic - hold 7 days before surgery. Do not take Ozempic on Monday, March 18. Resume on your regular day Monday,  March 25.  TAKE ONLY THESE MEDICATIONS THE MORNING OF SURGERY WITH A SIP OF WATER:  Pantoprazole (Protonix) - (take one the night before and one on the morning of surgery - helps to prevent nausea after surgery.) Pregabalin (Lyrica) Oxycodone if needed for pain  No Alcohol for 24 hours before or after surgery.  No Smoking including e-cigarettes for 24 hours before surgery.  No chewable tobacco products for at least 6 hours before surgery.  No nicotine patches on the day of surgery.  Do not use any "recreational" drugs for at least a week (preferably 2 weeks) before your surgery.  Please be advised that the combination of cocaine and anesthesia may have negative outcomes, up to and including death. If you test positive for cocaine, your surgery will be cancelled.  On the morning of surgery brush your teeth with toothpaste and water, you may rinse your mouth with mouthwash if you wish. Do not swallow any toothpaste or mouthwash.  Use CHG Soap as directed on instruction sheet.  Do not wear jewelry, make-up, hairpins, clips or nail polish.  Do not wear lotions, powders, or perfumes.   Do not shave body hair from the neck down 48 hours before surgery.  Contact lenses, hearing aids and dentures may not be worn into surgery.  Do not bring valuables to the hospital. [facility] is not responsible for any missing/lost belongings or valuables.   Notify your doctor if there is any change in your medical condition (cold, fever, infection).  Wear comfortable clothing (specific to your surgery type) to the  hospital.  After surgery, you can help prevent lung complications by doing breathing exercises.  Take deep breaths and cough every 1-2 hours. Your doctor may order a device called an Incentive Spirometer to help you take deep breaths.  If you are being admitted to the hospital overnight, leave your suitcase in the car. After surgery it may be brought to your room.  In case of increased  patient census, it may be necessary for you, the patient, to continue your postoperative care in the Same Day Surgery department.  If you are being discharged the day of surgery, you will not be allowed to drive home. You will need a responsible individual to drive you home and stay with you for 24 hours after surgery.   If you are taking public transportation, you will need to have a responsible individual with you.  Please call the [facility] [place] Dept. at [phone] if you have any questions about these instructions.  Surgery Visitation Policy:  Patients undergoing a surgery or procedure may have two family members or support persons with them as long as the person is not COVID-19 positive or experiencing its symptoms.   Inpatient Visitation:    Visiting hours are 7 a.m. to 8 p.m. Up to four visitors are allowed at one time in a patient room. The visitors may rotate out with other people during the day. One designated support person (adult) may remain overnight.  Due to an increase in RSV and influenza rates and associated hospitalizations, children ages 2 and under will not be able to visit patients in [facility]. Masks continue to be strongly recommended.     Preparing for Surgery with CHLORHEXIDINE GLUCONATE (CHG) Soap  Chlorhexidine Gluconate (CHG) Soap  o An antiseptic cleaner that kills germs and bonds with the skin to continue killing germs even after washing  o Used for showering the night before surgery and morning of surgery  Before surgery, you can play an important role by reducing the number of germs on your skin.  CHG (Chlorhexidine gluconate) soap is an antiseptic cleanser which kills germs and bonds with the skin to continue killing germs even after washing.  Please do not use if you have an allergy to CHG or antibacterial soaps. If your skin becomes reddened/irritated stop using the CHG.  1. Shower the NIGHT BEFORE SURGERY and the  MORNING OF SURGERY with CHG soap.  2. If you choose to wash your hair, wash your hair first as usual with your normal shampoo.  3. After shampooing, rinse your hair and body thoroughly to remove the shampoo.  4. Use CHG as you would any other liquid soap. You can apply CHG directly to the skin and wash gently with a scrungie or a clean washcloth.  5. Apply the CHG soap to your body only from the neck down. Do not use on open wounds or open sores. Avoid contact with your eyes, ears, mouth, and genitals (private parts). Wash face and genitals (private parts) with your normal soap.  6. Wash thoroughly, paying special attention to the area where your surgery will be performed.  7. Thoroughly rinse your body with warm water.  8. Do not shower/wash with your normal soap after using and rinsing off the CHG soap.  9. Pat yourself dry with a clean towel.  10. Wear clean pajamas to bed the night before surgery.  12. Place clean sheets on your bed the night of your first shower and do not sleep with pets.  70. Shower again with the CHG soap on the day of surgery prior to arriving at the hospital.  14. Do not apply any deodorants/lotions/powders.  15. Please wear clean clothes to the hospital.

## 2022-03-29 ENCOUNTER — Inpatient Hospital Stay: Admission: RE | Admit: 2022-03-29 | Payer: PPO | Source: Ambulatory Visit

## 2022-03-29 DIAGNOSIS — M5136 Other intervertebral disc degeneration, lumbar region: Secondary | ICD-10-CM | POA: Diagnosis not present

## 2022-03-29 DIAGNOSIS — M25512 Pain in left shoulder: Secondary | ICD-10-CM | POA: Diagnosis not present

## 2022-03-29 DIAGNOSIS — M4802 Spinal stenosis, cervical region: Secondary | ICD-10-CM | POA: Diagnosis not present

## 2022-03-29 DIAGNOSIS — G894 Chronic pain syndrome: Secondary | ICD-10-CM | POA: Diagnosis not present

## 2022-03-29 DIAGNOSIS — Z981 Arthrodesis status: Secondary | ICD-10-CM | POA: Diagnosis not present

## 2022-03-29 DIAGNOSIS — Z79891 Long term (current) use of opiate analgesic: Secondary | ICD-10-CM | POA: Diagnosis not present

## 2022-03-29 DIAGNOSIS — M545 Low back pain, unspecified: Secondary | ICD-10-CM | POA: Diagnosis not present

## 2022-03-29 DIAGNOSIS — M160 Bilateral primary osteoarthritis of hip: Secondary | ICD-10-CM | POA: Diagnosis not present

## 2022-03-29 DIAGNOSIS — G890 Central pain syndrome: Secondary | ICD-10-CM | POA: Diagnosis not present

## 2022-03-29 NOTE — Progress Notes (Signed)
  Perioperative Services Pre-Admission/Anesthesia Testing    Date: 03/29/22  Name: Erik Martinez MRN:   [id number]  Re: GLP-1 clearance and provider recommendations   Planned Surgical Procedure(s):    Case: [id number] Date/Time: 04/01/22 1058   Procedure: Removal of left total knee implants with insertion of spacer. 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 In review of his medication reconciliation it was noted that patient is on a prescribed GLP-1 medication. Per guidelines issued by the American Society of Anesthesiologists (ASA), it is recommended that these medications be held for 7 days prior to the patient undergoing any type of elective surgical procedure. The patient is taking the following GLP-1 medication:  [x]  SEMAGLUTIDE   []  EXENATIDE  []  LIRAGLUTIDE   []  LIXISENATIDE  []  DULAGLUTIDE     []  OTHER GLP-1 medication: _______________  Reached out to prescribing provider Diona Browner, MD) to make them aware of the guidelines from anesthesia. Given that this patient takes the prescribed GLP-1 medication for his  diabetes diagnosis, rather than for weight loss, recommendations from the prescribing provider were solicited. Prescribing provider made aware of the following so that informed decision/POC can be developed for this patient that may be taking medications belonging to these drug classes:  Oral GLP-1 medications will be held 1 day prior to surgery.  Injectable GLP-1 medications will be held 7 days prior to surgery.  Metformin is routinely held 48 hours prior to surgery due  to renal concerns, potential need for contrasted imaging perioperatively, and the potential for tissue hypoxia leading to drug induced lactic acidosis.  All SGLT2i medications are held 72 hours prior to surgery as they can be associated with the increased potential for developing euglycemic diabetic ketoacidosis (EDKA).   Impression and Plan:  Erik Martinez is on a prescribed GLP-1 medication, which induces the known side effect of decreased gastric emptying. Efforts are bring made to mitigate the risk of perioperative hyperglycemic events, as elevated blood glucose levels have been found to contribute to intra/postoperative complications. Additionally, hyperglycemic extremes can potentially necessitate the postponing of a patient's elective case in order to better optimize perioperative glycemic control, again with the aforementioned guidelines in place. With this in mind, recommendations have been sought from the prescribing provider, who has cleared patient to proceed with holding the prescribed GLP-1 as per the guidelines from the ASA.   Provider recommending: no further recommendations received from the prescribing provider.  Copy of signed clearance and recommendations placed on patient's chart for inclusion in their medical record and for review by the surgical/anesthetic team on the day of his procedure.   Honor Loh, MSN, APRN, FNP-C, CEN [facility]  Peri-operative Services Nurse Practitioner Phone: [phone] 03/29/22 11:47 AM  NOTE: This note has been prepared using Dragon dictation software. Despite my best ability to proofread, there is always the potential that unintentional transcriptional errors may still occur from this process.

## 2022-03-31 ENCOUNTER — Encounter: Payer: Self-pay | Admitting: Orthopedic Surgery

## 2022-04-01 ENCOUNTER — Inpatient Hospital Stay: Payer: PPO | Admitting: Urgent Care

## 2022-04-01 ENCOUNTER — Other Ambulatory Visit: Payer: Self-pay

## 2022-04-01 ENCOUNTER — Inpatient Hospital Stay
Admission: RE | Admit: 2022-04-01 | Discharge: 2022-04-04 | DRG: 467 | Disposition: A | Discharge status: Home Health | Payer: PPO | Attending: Orthopedic Surgery | Admitting: Orthopedic Surgery

## 2022-04-01 ENCOUNTER — Encounter: Payer: Self-pay | Admitting: Orthopedic Surgery

## 2022-04-01 ENCOUNTER — Inpatient Hospital Stay: Payer: PPO

## 2022-04-01 ENCOUNTER — Encounter
Admission: RE | Disposition: A | Discharge status: Home Health | Payer: Self-pay | Source: Home / Self Care | Attending: Orthopedic Surgery

## 2022-04-01 DIAGNOSIS — Z801 Family history of malignant neoplasm of trachea, bronchus and lung: Secondary | ICD-10-CM

## 2022-04-01 DIAGNOSIS — B9561 Methicillin susceptible Staphylococcus aureus infection as the cause of diseases classified elsewhere: Secondary | ICD-10-CM | POA: Diagnosis not present

## 2022-04-01 DIAGNOSIS — Z96652 Presence of left artificial knee joint: Secondary | ICD-10-CM | POA: Diagnosis not present

## 2022-04-01 DIAGNOSIS — Z471 Aftercare following joint replacement surgery: Secondary | ICD-10-CM | POA: Diagnosis not present

## 2022-04-01 DIAGNOSIS — N179 Acute kidney failure, unspecified: Secondary | ICD-10-CM | POA: Diagnosis not present

## 2022-04-01 DIAGNOSIS — N319 Neuromuscular dysfunction of bladder, unspecified: Secondary | ICD-10-CM | POA: Diagnosis present

## 2022-04-01 DIAGNOSIS — Z8261 Family history of arthritis: Secondary | ICD-10-CM

## 2022-04-01 DIAGNOSIS — E7800 Pure hypercholesterolemia, unspecified: Secondary | ICD-10-CM | POA: Diagnosis not present

## 2022-04-01 DIAGNOSIS — Z8619 Personal history of other infectious and parasitic diseases: Secondary | ICD-10-CM | POA: Diagnosis not present

## 2022-04-01 DIAGNOSIS — E1142 Type 2 diabetes mellitus with diabetic polyneuropathy: Secondary | ICD-10-CM

## 2022-04-01 DIAGNOSIS — Z79899 Other long term (current) drug therapy: Secondary | ICD-10-CM

## 2022-04-01 DIAGNOSIS — L24A9 Irritant contact dermatitis due friction or contact with other specified body fluids: Secondary | ICD-10-CM

## 2022-04-01 DIAGNOSIS — M1990 Unspecified osteoarthritis, unspecified site: Secondary | ICD-10-CM | POA: Diagnosis present

## 2022-04-01 DIAGNOSIS — D62 Acute posthemorrhagic anemia: Secondary | ICD-10-CM | POA: Diagnosis not present

## 2022-04-01 DIAGNOSIS — T84033A Mechanical loosening of internal left knee prosthetic joint, initial encounter: Secondary | ICD-10-CM | POA: Diagnosis not present

## 2022-04-01 DIAGNOSIS — J449 Chronic obstructive pulmonary disease, unspecified: Secondary | ICD-10-CM | POA: Diagnosis present

## 2022-04-01 DIAGNOSIS — M009 Pyogenic arthritis, unspecified: Secondary | ICD-10-CM

## 2022-04-01 DIAGNOSIS — F319 Bipolar disorder, unspecified: Secondary | ICD-10-CM | POA: Diagnosis present

## 2022-04-01 DIAGNOSIS — Z833 Family history of diabetes mellitus: Secondary | ICD-10-CM

## 2022-04-01 DIAGNOSIS — M24662 Ankylosis, left knee: Secondary | ICD-10-CM | POA: Diagnosis not present

## 2022-04-01 DIAGNOSIS — F419 Anxiety disorder, unspecified: Secondary | ICD-10-CM | POA: Diagnosis present

## 2022-04-01 DIAGNOSIS — Z7982 Long term (current) use of aspirin: Secondary | ICD-10-CM

## 2022-04-01 DIAGNOSIS — T8454XA Infection and inflammatory reaction due to internal left knee prosthesis, initial encounter: Principal | ICD-10-CM | POA: Diagnosis present

## 2022-04-01 DIAGNOSIS — K219 Gastro-esophageal reflux disease without esophagitis: Secondary | ICD-10-CM | POA: Diagnosis not present

## 2022-04-01 DIAGNOSIS — Z7984 Long term (current) use of oral hypoglycemic drugs: Secondary | ICD-10-CM | POA: Diagnosis not present

## 2022-04-01 DIAGNOSIS — N3289 Other specified disorders of bladder: Secondary | ICD-10-CM | POA: Diagnosis not present

## 2022-04-01 DIAGNOSIS — G894 Chronic pain syndrome: Secondary | ICD-10-CM | POA: Diagnosis not present

## 2022-04-01 DIAGNOSIS — I2510 Atherosclerotic heart disease of native coronary artery without angina pectoris: Secondary | ICD-10-CM | POA: Diagnosis not present

## 2022-04-01 DIAGNOSIS — Z96642 Presence of left artificial hip joint: Secondary | ICD-10-CM | POA: Diagnosis present

## 2022-04-01 DIAGNOSIS — E1151 Type 2 diabetes mellitus with diabetic peripheral angiopathy without gangrene: Secondary | ICD-10-CM | POA: Diagnosis present

## 2022-04-01 DIAGNOSIS — S348XXS Injury of other nerves at abdomen, lower back and pelvis level, sequela: Secondary | ICD-10-CM

## 2022-04-01 DIAGNOSIS — K760 Fatty (change of) liver, not elsewhere classified: Secondary | ICD-10-CM | POA: Diagnosis present

## 2022-04-01 DIAGNOSIS — I70202 Unspecified atherosclerosis of native arteries of extremities, left leg: Secondary | ICD-10-CM | POA: Diagnosis present

## 2022-04-01 DIAGNOSIS — Y792 Prosthetic and other implants, materials and accessory orthopedic devices associated with adverse incidents: Secondary | ICD-10-CM | POA: Diagnosis present

## 2022-04-01 DIAGNOSIS — Z888 Allergy status to other drugs, medicaments and biological substances status: Secondary | ICD-10-CM

## 2022-04-01 DIAGNOSIS — Z8262 Family history of osteoporosis: Secondary | ICD-10-CM

## 2022-04-01 DIAGNOSIS — Z8673 Personal history of transient ischemic attack (TIA), and cerebral infarction without residual deficits: Secondary | ICD-10-CM

## 2022-04-01 DIAGNOSIS — Z9889 Other specified postprocedural states: Secondary | ICD-10-CM | POA: Diagnosis not present

## 2022-04-01 DIAGNOSIS — G4700 Insomnia, unspecified: Secondary | ICD-10-CM | POA: Diagnosis present

## 2022-04-01 DIAGNOSIS — Z9682 Presence of neurostimulator: Secondary | ICD-10-CM

## 2022-04-01 DIAGNOSIS — Z95820 Peripheral vascular angioplasty status with implants and grafts: Secondary | ICD-10-CM | POA: Diagnosis not present

## 2022-04-01 DIAGNOSIS — X58XXXS Exposure to other specified factors, sequela: Secondary | ICD-10-CM | POA: Diagnosis present

## 2022-04-01 DIAGNOSIS — N39498 Other specified urinary incontinence: Secondary | ICD-10-CM | POA: Diagnosis present

## 2022-04-01 DIAGNOSIS — T8459XD Infection and inflammatory reaction due to other internal joint prosthesis, subsequent encounter: Secondary | ICD-10-CM | POA: Diagnosis not present

## 2022-04-01 DIAGNOSIS — Z01812 Encounter for preprocedural laboratory examination: Secondary | ICD-10-CM

## 2022-04-01 DIAGNOSIS — Z91199 Patient's noncompliance with other medical treatment and regimen due to unspecified reason: Secondary | ICD-10-CM

## 2022-04-01 DIAGNOSIS — Y831 Surgical operation with implant of artificial internal device as the cause of abnormal reaction of the patient, or of later complication, without mention of misadventure at the time of the procedure: Secondary | ICD-10-CM | POA: Diagnosis present

## 2022-04-01 DIAGNOSIS — E1165 Type 2 diabetes mellitus with hyperglycemia: Secondary | ICD-10-CM | POA: Diagnosis present

## 2022-04-01 DIAGNOSIS — Z823 Family history of stroke: Secondary | ICD-10-CM

## 2022-04-01 DIAGNOSIS — Z885 Allergy status to narcotic agent status: Secondary | ICD-10-CM

## 2022-04-01 DIAGNOSIS — Z981 Arthrodesis status: Secondary | ICD-10-CM

## 2022-04-01 DIAGNOSIS — Z83438 Family history of other disorder of lipoprotein metabolism and other lipidemia: Secondary | ICD-10-CM

## 2022-04-01 DIAGNOSIS — F418 Other specified anxiety disorders: Secondary | ICD-10-CM | POA: Diagnosis not present

## 2022-04-01 HISTORY — PX: TOTAL KNEE REVISION: SHX996

## 2022-04-01 LAB — GLUCOSE, CAPILLARY
Glucose-Capillary: 141 mg/dL — ABNORMAL HIGH (ref 70–99)
Glucose-Capillary: 151 mg/dL — ABNORMAL HIGH (ref 70–99)
Glucose-Capillary: 152 mg/dL — ABNORMAL HIGH (ref 70–99)

## 2022-04-01 SURGERY — TOTAL KNEE REVISION
Anesthesia: General | Site: Knee | Laterality: Left

## 2022-04-01 MED ORDER — ONDANSETRON HCL 4 MG/2ML IJ SOLN
INTRAMUSCULAR | Status: AC
  Filled 2022-04-01: qty 2

## 2022-04-01 MED ORDER — LIDOCAINE HCL (PF) 2 % IJ SOLN
INTRAMUSCULAR | Status: AC
  Filled 2022-04-01: qty 5

## 2022-04-01 MED ORDER — NAFCILLIN SODIUM 2 G IJ SOLR
2.0000 g | INTRAMUSCULAR | Status: DC
  Filled 2022-04-01 (×11): qty 8

## 2022-04-01 MED ORDER — STERILE WATER FOR IRRIGATION IR SOLN
Status: DC | PRN
  Administered 2022-04-01: 100 mL

## 2022-04-01 MED ORDER — OXYCODONE HCL 5 MG PO TABS
5.0000 mg | ORAL_TABLET | Freq: Once | ORAL | Status: AC
  Administered 2022-04-01: 5 mg via ORAL

## 2022-04-01 MED ORDER — OXYCODONE HCL 5 MG PO TABS
15.0000 mg | ORAL_TABLET | Freq: Four times a day (QID) | ORAL | Status: DC
  Administered 2022-04-02 – 2022-04-04 (×11): 15 mg via ORAL
  Filled 2022-04-01 (×11): qty 3

## 2022-04-01 MED ORDER — METOPROLOL TARTRATE 5 MG/5ML IV SOLN
INTRAVENOUS | Status: DC | PRN
  Administered 2022-04-01: 2 mg via INTRAVENOUS
  Administered 2022-04-01: 3 mg via INTRAVENOUS

## 2022-04-01 MED ORDER — TOBRAMYCIN SULFATE 1.2 G IJ SOLR
INTRAMUSCULAR | Status: AC
  Filled 2022-04-01: qty 3.6

## 2022-04-01 MED ORDER — NEOMYCIN-POLYMYXIN B GU 40-200000 IR SOLN
Status: AC
  Filled 2022-04-01: qty 20

## 2022-04-01 MED ORDER — HYDROMORPHONE HCL 1 MG/ML IJ SOLN
INTRAMUSCULAR | Status: AC
  Administered 2022-04-02: 1 mg via INTRAVENOUS
  Filled 2022-04-01: qty 1

## 2022-04-01 MED ORDER — METHYLENE BLUE 1 % INJ SOLN
INTRAVENOUS | Status: AC
  Filled 2022-04-01: qty 10

## 2022-04-01 MED ORDER — ONDANSETRON HCL 4 MG/2ML IJ SOLN: 4.0000 mg | Freq: Four times a day (QID) | INTRAMUSCULAR | Status: DC | PRN

## 2022-04-01 MED ORDER — FENTANYL CITRATE (PF) 100 MCG/2ML IJ SOLN
INTRAMUSCULAR | Status: AC
  Filled 2022-04-01: qty 2

## 2022-04-01 MED ORDER — CHLORHEXIDINE GLUCONATE 4 % EX LIQD
60.0000 mL | Freq: Once | CUTANEOUS | Status: AC
  Administered 2022-04-01: 4 via TOPICAL

## 2022-04-01 MED ORDER — SODIUM CHLORIDE 0.9 % IV SOLN: INTRAVENOUS | Status: DC

## 2022-04-01 MED ORDER — CEFAZOLIN SODIUM-DEXTROSE 2-3 GM-%(50ML) IV SOLR
INTRAVENOUS | Status: DC | PRN
  Administered 2022-04-01 (×3): 2 g via INTRAVENOUS

## 2022-04-01 MED ORDER — TOBRAMYCIN SULFATE 1.2 G IJ SOLR
INTRAMUSCULAR | Status: DC | PRN
  Administered 2022-04-01: 10.8 g
  Administered 2022-04-01: 1.2 g

## 2022-04-01 MED ORDER — TRAMADOL HCL 50 MG PO TABS
50.0000 mg | ORAL_TABLET | ORAL | Status: DC | PRN
  Administered 2022-04-01 – 2022-04-02 (×2): 100 mg via ORAL
  Filled 2022-04-01 (×2): qty 2

## 2022-04-01 MED ORDER — TRANEXAMIC ACID-NACL 1000-0.7 MG/100ML-% IV SOLN
1000.0000 mg | INTRAVENOUS | Status: AC
  Administered 2022-04-01: 1000 mg via INTRAVENOUS

## 2022-04-01 MED ORDER — INSULIN ASPART 100 UNIT/ML IJ SOLN: 0.0000 [IU] | Freq: Every day | INTRAMUSCULAR | Status: DC

## 2022-04-01 MED ORDER — DEXAMETHASONE SODIUM PHOSPHATE 10 MG/ML IJ SOLN: 8.0000 mg | Freq: Once | INTRAMUSCULAR | Status: AC

## 2022-04-01 MED ORDER — ATORVASTATIN CALCIUM 20 MG PO TABS
20.0000 mg | ORAL_TABLET | Freq: Every day | ORAL | Status: DC
  Administered 2022-04-02 – 2022-04-03 (×2): 20 mg via ORAL
  Filled 2022-04-01 (×2): qty 1

## 2022-04-01 MED ORDER — SODIUM CHLORIDE 0.9 % IR SOLN
Status: DC | PRN
  Administered 2022-04-01 (×3): 3000 mL

## 2022-04-01 MED ORDER — OXYCODONE HCL 5 MG PO TABS
10.0000 mg | ORAL_TABLET | ORAL | Status: DC | PRN
  Administered 2022-04-02 – 2022-04-03 (×5): 10 mg via ORAL
  Filled 2022-04-01 (×4): qty 2

## 2022-04-01 MED ORDER — MINERAL OIL LIGHT OIL
TOPICAL_OIL | Status: DC | PRN
  Administered 2022-04-01: 1 via TOPICAL

## 2022-04-01 MED ORDER — CHLORHEXIDINE GLUCONATE 0.12 % MT SOLN
OROMUCOSAL | Status: AC
  Administered 2022-04-01: 15 mL via OROMUCOSAL
  Filled 2022-04-01: qty 15

## 2022-04-01 MED ORDER — MENTHOL 3 MG MT LOZG: 1.0000 | LOZENGE | OROMUCOSAL | Status: DC | PRN

## 2022-04-01 MED ORDER — CELECOXIB 200 MG PO CAPS
ORAL_CAPSULE | ORAL | Status: AC
  Administered 2022-04-01: 400 mg via ORAL
  Filled 2022-04-01: qty 2

## 2022-04-01 MED ORDER — HYDROMORPHONE HCL 1 MG/ML IJ SOLN
INTRAMUSCULAR | Status: AC
  Filled 2022-04-01: qty 1

## 2022-04-01 MED ORDER — CEFAZOLIN SODIUM 1 G IJ SOLR
INTRAMUSCULAR | Status: AC
  Filled 2022-04-01: qty 10

## 2022-04-01 MED ORDER — GABAPENTIN 300 MG PO CAPS
ORAL_CAPSULE | ORAL | Status: AC
  Administered 2022-04-01: 300 mg via ORAL
  Filled 2022-04-01: qty 1

## 2022-04-01 MED ORDER — MINERAL OIL LIGHT OIL
TOPICAL_OIL | Status: AC
  Filled 2022-04-01: qty 20

## 2022-04-01 MED ORDER — ACETAMINOPHEN 325 MG PO TABS: 325.0000 mg | ORAL_TABLET | Freq: Four times a day (QID) | ORAL | Status: DC | PRN

## 2022-04-01 MED ORDER — ALUM & MAG HYDROXIDE-SIMETH 200-200-20 MG/5ML PO SUSP
30.0000 mL | ORAL | Status: DC | PRN
  Administered 2022-04-02: 30 mL via ORAL
  Filled 2022-04-01: qty 30

## 2022-04-01 MED ORDER — ENOXAPARIN SODIUM 30 MG/0.3ML IJ SOSY
30.0000 mg | PREFILLED_SYRINGE | Freq: Two times a day (BID) | INTRAMUSCULAR | Status: DC
  Administered 2022-04-02 – 2022-04-04 (×5): 30 mg via SUBCUTANEOUS
  Filled 2022-04-01 (×5): qty 0.3

## 2022-04-01 MED ORDER — DIPHENHYDRAMINE HCL 12.5 MG/5ML PO ELIX: 12.5000 mg | ORAL_SOLUTION | ORAL | Status: DC | PRN

## 2022-04-01 MED ORDER — GABAPENTIN 300 MG PO CAPS: 300.0000 mg | ORAL_CAPSULE | Freq: Once | ORAL | Status: AC

## 2022-04-01 MED ORDER — METOCLOPRAMIDE HCL 5 MG PO TABS
10.0000 mg | ORAL_TABLET | Freq: Three times a day (TID) | ORAL | Status: AC
  Administered 2022-04-02 – 2022-04-03 (×8): 10 mg via ORAL
  Filled 2022-04-01 (×8): qty 2

## 2022-04-01 MED ORDER — KETAMINE HCL 10 MG/ML IJ SOLN
INTRAMUSCULAR | Status: DC | PRN
  Administered 2022-04-01 (×3): 20 mg via INTRAVENOUS
  Administered 2022-04-01: 10 mg via INTRAVENOUS
  Administered 2022-04-01: 20 mg via INTRAVENOUS

## 2022-04-01 MED ORDER — SURGIPHOR WOUND IRRIGATION SYSTEM - OPTIME: TOPICAL | Status: DC | PRN

## 2022-04-01 MED ORDER — HYDROMORPHONE HCL 1 MG/ML IJ SOLN
0.5000 mg | INTRAMUSCULAR | Status: DC | PRN
  Administered 2022-04-02 – 2022-04-03 (×3): 1 mg via INTRAVENOUS
  Filled 2022-04-01 (×5): qty 1

## 2022-04-01 MED ORDER — TRANEXAMIC ACID-NACL 1000-0.7 MG/100ML-% IV SOLN
INTRAVENOUS | Status: AC
  Administered 2022-04-01: 1000 mg
  Filled 2022-04-01: qty 100

## 2022-04-01 MED ORDER — HYDROMORPHONE HCL 1 MG/ML IJ SOLN
INTRAMUSCULAR | Status: DC | PRN
  Administered 2022-04-01: .5 mg via INTRAVENOUS
  Administered 2022-04-01: 1 mg via INTRAVENOUS
  Administered 2022-04-01: .5 mg via INTRAVENOUS

## 2022-04-01 MED ORDER — TRANEXAMIC ACID-NACL 1000-0.7 MG/100ML-% IV SOLN: 1000.0000 mg | Freq: Once | INTRAVENOUS | Status: DC

## 2022-04-01 MED ORDER — VANCOMYCIN HCL 1000 MG IV SOLR
INTRAVENOUS | Status: AC
  Filled 2022-04-01: qty 120

## 2022-04-01 MED ORDER — CEFAZOLIN SODIUM 1 G IJ SOLR
INTRAMUSCULAR | Status: AC
  Filled 2022-04-01: qty 20

## 2022-04-01 MED ORDER — SENNOSIDES-DOCUSATE SODIUM 8.6-50 MG PO TABS
1.0000 | ORAL_TABLET | Freq: Two times a day (BID) | ORAL | Status: DC
  Administered 2022-04-01 – 2022-04-04 (×6): 1 via ORAL
  Filled 2022-04-01 (×6): qty 1

## 2022-04-01 MED ORDER — MIDAZOLAM HCL 2 MG/2ML IJ SOLN
INTRAMUSCULAR | Status: DC | PRN
  Administered 2022-04-01: 2 mg via INTRAVENOUS

## 2022-04-01 MED ORDER — ACETAMINOPHEN 10 MG/ML IV SOLN
INTRAVENOUS | Status: AC
  Filled 2022-04-01: qty 100

## 2022-04-01 MED ORDER — ADULT MULTIVITAMIN W/MINERALS CH
1.0000 | ORAL_TABLET | Freq: Every day | ORAL | Status: DC
  Administered 2022-04-02 – 2022-04-04 (×3): 1 via ORAL
  Filled 2022-04-01 (×3): qty 1

## 2022-04-01 MED ORDER — ORAL CARE MOUTH RINSE: 15.0000 mL | Freq: Once | OROMUCOSAL | Status: AC

## 2022-04-01 MED ORDER — HYDROMORPHONE HCL 1 MG/ML IJ SOLN
0.5000 mg | INTRAMUSCULAR | Status: DC | PRN
  Administered 2022-04-01 (×2): 0.5 mg via INTRAVENOUS

## 2022-04-01 MED ORDER — KETAMINE HCL 50 MG/5ML IJ SOSY
PREFILLED_SYRINGE | INTRAMUSCULAR | Status: AC
  Filled 2022-04-01: qty 5

## 2022-04-01 MED ORDER — SODIUM CHLORIDE 0.9 % IR SOLN: Status: DC | PRN

## 2022-04-01 MED ORDER — LABETALOL HCL 5 MG/ML IV SOLN
INTRAVENOUS | Status: DC | PRN
  Administered 2022-04-01: 10 mg via INTRAVENOUS

## 2022-04-01 MED ORDER — ONDANSETRON HCL 4 MG/2ML IJ SOLN
INTRAMUSCULAR | Status: DC | PRN
  Administered 2022-04-01: 4 mg via INTRAVENOUS

## 2022-04-01 MED ORDER — METFORMIN HCL 500 MG PO TABS
1000.0000 mg | ORAL_TABLET | Freq: Two times a day (BID) | ORAL | Status: DC
  Administered 2022-04-02 – 2022-04-04 (×5): 1000 mg via ORAL
  Filled 2022-04-01 (×5): qty 2

## 2022-04-01 MED ORDER — LACTATED RINGERS IV SOLN: INTRAVENOUS | Status: DC | PRN

## 2022-04-01 MED ORDER — BUPIVACAINE HCL (PF) 0.5 % IJ SOLN
INTRAMUSCULAR | Status: AC
  Filled 2022-04-01: qty 10

## 2022-04-01 MED ORDER — METHYLENE BLUE 1 % INJ SOLN
INTRAVENOUS | Status: DC | PRN
  Administered 2022-04-01: 20 mL

## 2022-04-01 MED ORDER — LIDOCAINE HCL (CARDIAC) PF 100 MG/5ML IV SOSY
PREFILLED_SYRINGE | INTRAVENOUS | Status: DC | PRN
  Administered 2022-04-01: 50 mg via INTRAVENOUS
  Administered 2022-04-01: 100 mg via INTRAVENOUS

## 2022-04-01 MED ORDER — TRANEXAMIC ACID-NACL 1000-0.7 MG/100ML-% IV SOLN
INTRAVENOUS | Status: AC
  Filled 2022-04-01: qty 100

## 2022-04-01 MED ORDER — PHENYLEPHRINE HCL (PRESSORS) 10 MG/ML IV SOLN
INTRAVENOUS | Status: DC | PRN
  Administered 2022-04-01: 120 ug via INTRAVENOUS
  Administered 2022-04-01: 240 ug via INTRAVENOUS
  Administered 2022-04-01: 160 ug via INTRAVENOUS
  Administered 2022-04-01: 120 ug via INTRAVENOUS
  Administered 2022-04-01 (×2): 160 ug via INTRAVENOUS
  Administered 2022-04-01: 120 ug via INTRAVENOUS
  Administered 2022-04-01: 160 ug via INTRAVENOUS
  Administered 2022-04-01: 120 ug via INTRAVENOUS
  Administered 2022-04-01: 80 ug via INTRAVENOUS

## 2022-04-01 MED ORDER — MIDAZOLAM HCL 2 MG/2ML IJ SOLN
INTRAMUSCULAR | Status: AC
  Filled 2022-04-01: qty 2

## 2022-04-01 MED ORDER — PANTOPRAZOLE SODIUM 40 MG PO TBEC
40.0000 mg | DELAYED_RELEASE_TABLET | Freq: Two times a day (BID) | ORAL | Status: DC
  Administered 2022-04-01 – 2022-04-04 (×6): 40 mg via ORAL
  Filled 2022-04-01 (×6): qty 1

## 2022-04-01 MED ORDER — OXYCODONE HCL 5 MG PO TABS
5.0000 mg | ORAL_TABLET | ORAL | Status: DC | PRN
  Filled 2022-04-01 (×2): qty 1

## 2022-04-01 MED ORDER — CHLORHEXIDINE GLUCONATE 0.12 % MT SOLN: 15.0000 mL | Freq: Once | OROMUCOSAL | Status: AC

## 2022-04-01 MED ORDER — FENTANYL CITRATE (PF) 100 MCG/2ML IJ SOLN
25.0000 ug | INTRAMUSCULAR | Status: DC | PRN
  Administered 2022-04-01 (×2): 25 ug via INTRAVENOUS
  Administered 2022-04-01: 50 ug via INTRAVENOUS

## 2022-04-01 MED ORDER — TERBINAFINE HCL 250 MG PO TABS
250.0000 mg | ORAL_TABLET | Freq: Every day | ORAL | Status: DC
  Administered 2022-04-02 – 2022-04-04 (×3): 250 mg via ORAL
  Filled 2022-04-01 (×3): qty 1

## 2022-04-01 MED ORDER — PHENYLEPHRINE 80 MCG/ML (10ML) SYRINGE FOR IV PUSH (FOR BLOOD PRESSURE SUPPORT)
PREFILLED_SYRINGE | INTRAVENOUS | Status: AC
  Filled 2022-04-01: qty 20

## 2022-04-01 MED ORDER — FAMOTIDINE 20 MG PO TABS
ORAL_TABLET | ORAL | Status: AC
  Administered 2022-04-01: 20 mg
  Filled 2022-04-01: qty 1

## 2022-04-01 MED ORDER — INSULIN ASPART 100 UNIT/ML IJ SOLN
0.0000 [IU] | Freq: Three times a day (TID) | INTRAMUSCULAR | Status: DC
  Administered 2022-04-02: 2 [IU] via SUBCUTANEOUS
  Administered 2022-04-02: 5 [IU] via SUBCUTANEOUS
  Administered 2022-04-02: 3 [IU] via SUBCUTANEOUS
  Administered 2022-04-03: 2 [IU] via SUBCUTANEOUS
  Administered 2022-04-03: 3 [IU] via SUBCUTANEOUS
  Administered 2022-04-03 – 2022-04-04 (×3): 2 [IU] via SUBCUTANEOUS
  Filled 2022-04-01 (×8): qty 1

## 2022-04-01 MED ORDER — BISACODYL 10 MG RE SUPP: 10.0000 mg | Freq: Every day | RECTAL | Status: DC | PRN

## 2022-04-01 MED ORDER — CELECOXIB 200 MG PO CAPS
200.0000 mg | ORAL_CAPSULE | Freq: Two times a day (BID) | ORAL | Status: DC
  Administered 2022-04-01 – 2022-04-04 (×6): 200 mg via ORAL
  Filled 2022-04-01 (×6): qty 1

## 2022-04-01 MED ORDER — PROPOFOL 10 MG/ML IV BOLUS
INTRAVENOUS | Status: DC | PRN
  Administered 2022-04-01: 160 mg via INTRAVENOUS

## 2022-04-01 MED ORDER — ZOLPIDEM TARTRATE 5 MG PO TABS
10.0000 mg | ORAL_TABLET | Freq: Every evening | ORAL | Status: DC | PRN
  Administered 2022-04-02 – 2022-04-03 (×3): 10 mg via ORAL
  Filled 2022-04-01 (×3): qty 2

## 2022-04-01 MED ORDER — 0.9 % SODIUM CHLORIDE (POUR BTL) OPTIME
TOPICAL | Status: DC | PRN
  Administered 2022-04-01: 1000 mL

## 2022-04-01 MED ORDER — ACETAMINOPHEN 10 MG/ML IV SOLN
1000.0000 mg | Freq: Four times a day (QID) | INTRAVENOUS | Status: AC
  Administered 2022-04-02 (×3): 1000 mg via INTRAVENOUS
  Filled 2022-04-01 (×3): qty 100

## 2022-04-01 MED ORDER — PROPOFOL 10 MG/ML IV BOLUS
INTRAVENOUS | Status: AC
  Filled 2022-04-01: qty 20

## 2022-04-01 MED ORDER — VANCOMYCIN HCL 1000 MG IV SOLR
INTRAVENOUS | Status: DC | PRN
  Administered 2022-04-01: 2 g
  Administered 2022-04-01: 4 g

## 2022-04-01 MED ORDER — PHENOL 1.4 % MT LIQD: 1.0000 | OROMUCOSAL | Status: DC | PRN

## 2022-04-01 MED ORDER — TIZANIDINE HCL 4 MG PO TABS
4.0000 mg | ORAL_TABLET | Freq: Three times a day (TID) | ORAL | Status: DC | PRN
  Administered 2022-04-02: 4 mg via ORAL
  Filled 2022-04-01: qty 1

## 2022-04-01 MED ORDER — FENTANYL CITRATE (PF) 100 MCG/2ML IJ SOLN
INTRAMUSCULAR | Status: DC | PRN
  Administered 2022-04-01 (×4): 50 ug via INTRAVENOUS

## 2022-04-01 MED ORDER — SUGAMMADEX SODIUM 200 MG/2ML IV SOLN
INTRAVENOUS | Status: DC | PRN
  Administered 2022-04-01: 200 mg via INTRAVENOUS

## 2022-04-01 MED ORDER — CYCLOBENZAPRINE HCL 10 MG PO TABS
10.0000 mg | ORAL_TABLET | Freq: Two times a day (BID) | ORAL | Status: DC
  Administered 2022-04-02 – 2022-04-04 (×5): 10 mg via ORAL
  Filled 2022-04-01 (×5): qty 1

## 2022-04-01 MED ORDER — PREGABALIN 75 MG PO CAPS
200.0000 mg | ORAL_CAPSULE | Freq: Three times a day (TID) | ORAL | Status: DC
  Administered 2022-04-01 – 2022-04-04 (×8): 200 mg via ORAL
  Filled 2022-04-01 (×8): qty 1

## 2022-04-01 MED ORDER — PROMETHAZINE HCL 25 MG/ML IJ SOLN: 6.2500 mg | INTRAMUSCULAR | Status: DC | PRN

## 2022-04-01 MED ORDER — ACETAMINOPHEN 10 MG/ML IV SOLN
INTRAVENOUS | Status: DC | PRN
  Administered 2022-04-01: 1000 mg via INTRAVENOUS

## 2022-04-01 MED ORDER — ONDANSETRON HCL 4 MG PO TABS: 4.0000 mg | ORAL_TABLET | Freq: Four times a day (QID) | ORAL | Status: DC | PRN

## 2022-04-01 MED ORDER — DEXAMETHASONE SODIUM PHOSPHATE 10 MG/ML IJ SOLN
INTRAMUSCULAR | Status: AC
  Administered 2022-04-01: 8 mg via INTRAVENOUS
  Filled 2022-04-01: qty 1

## 2022-04-01 MED ORDER — OXYCODONE HCL 5 MG PO TABS
ORAL_TABLET | ORAL | Status: AC
  Administered 2022-04-02: 15 mg via ORAL
  Filled 2022-04-01: qty 1

## 2022-04-01 MED ORDER — FLEET ENEMA 7-19 GM/118ML RE ENEM: 1.0000 | ENEMA | Freq: Once | RECTAL | Status: DC | PRN

## 2022-04-01 MED ORDER — MAGNESIUM HYDROXIDE 400 MG/5ML PO SUSP
30.0000 mL | Freq: Every day | ORAL | Status: DC
  Administered 2022-04-02 – 2022-04-04 (×3): 30 mL via ORAL
  Filled 2022-04-01 (×3): qty 30

## 2022-04-01 MED ORDER — DEXMEDETOMIDINE HCL IN NACL 200 MCG/50ML IV SOLN
INTRAVENOUS | Status: DC | PRN
  Administered 2022-04-01: 8 ug via INTRAVENOUS
  Administered 2022-04-01: 4 ug via INTRAVENOUS
  Administered 2022-04-01: 16 ug via INTRAVENOUS
  Administered 2022-04-01: 20 ug via INTRAVENOUS
  Administered 2022-04-01 (×2): 8 ug via INTRAVENOUS
  Administered 2022-04-01: 4 ug via INTRAVENOUS

## 2022-04-01 MED ORDER — SODIUM CHLORIDE 0.9 % IV SOLN
2.0000 g | INTRAVENOUS | Status: DC
  Administered 2022-04-01 – 2022-04-04 (×14): 2 g via INTRAVENOUS
  Filled 2022-04-01 (×4): qty 8
  Filled 2022-04-01: qty 2
  Filled 2022-04-01 (×14): qty 8

## 2022-04-01 MED ORDER — FERROUS SULFATE 325 (65 FE) MG PO TABS
324.0000 mg | ORAL_TABLET | Freq: Every day | ORAL | Status: DC
  Administered 2022-04-02 – 2022-04-04 (×3): 324 mg via ORAL
  Filled 2022-04-01 (×3): qty 1

## 2022-04-01 MED ORDER — ROCURONIUM BROMIDE 100 MG/10ML IV SOLN
INTRAVENOUS | Status: DC | PRN
  Administered 2022-04-01: 20 mg via INTRAVENOUS
  Administered 2022-04-01: 60 mg via INTRAVENOUS

## 2022-04-01 MED ORDER — CELECOXIB 200 MG PO CAPS: 400.0000 mg | ORAL_CAPSULE | Freq: Once | ORAL | Status: AC

## 2022-04-01 MED ORDER — DEXAMETHASONE SODIUM PHOSPHATE 10 MG/ML IJ SOLN
INTRAMUSCULAR | Status: AC
  Filled 2022-04-01: qty 1

## 2022-04-01 SURGICAL SUPPLY — 106 items
6mm flexible osteotome IMPLANT
8mm curved flexible osteotome IMPLANT
8mm flexible osteotome IMPLANT
BLADE OSCILLATING/SAGITTAL (BLADE) ×2
BLADE OSTEOTOME CRVD 12X16 (BLADE) ×1
BLADE OSTEOTOME FLAT 6X3 (BLADE) IMPLANT
BLADE OSTEOTOME FLAT 8X5 (BLADE) IMPLANT
BLADE OSTM CVD HIP 16X12 (BLADE) IMPLANT
BLADE SAGITTAL 25.0X1.19X90 (BLADE) ×1 IMPLANT
BLADE SAW 70X12.5 (BLADE) ×1 IMPLANT
BLADE SAW 90X13X1.19 OSCILLAT (BLADE) ×1 IMPLANT
BLADE SAW 90X25X1.19 OSCILLAT (BLADE) ×1 IMPLANT
BLADE SAW SGTL 13X75X1.27 (BLADE) ×1 IMPLANT
BLADE SW THK.38XMED LNG THN (BLADE) ×1 IMPLANT
BONE CEMENT GENTAMICIN (Cement) IMPLANT
BOWL CEMENT MIX W/ADAPTER (MISCELLANEOUS) IMPLANT
BRACE KNEE POST OP SHORT (BRACE) IMPLANT
CANISTER WOUND CARE 500ML ATS (WOUND CARE) ×1 IMPLANT
CARTRIDGE OIL MAESTRO DRILL (MISCELLANEOUS) IMPLANT
CEMENT HV SMART SET (Cement) IMPLANT
CNTNR URN SCR LID CUP LEK RST (MISCELLANEOUS) IMPLANT
CONT SPEC 4OZ STRL OR WHT (MISCELLANEOUS) ×5
COOLER POLAR GLACIER W/PUMP (MISCELLANEOUS) ×1 IMPLANT
COVER BACK TABLE REUSABLE LG (DRAPES) ×1 IMPLANT
CUFF TOURN SGL QUICK 24 (TOURNIQUET CUFF)
CUFF TOURN SGL QUICK 34 (TOURNIQUET CUFF)
CUFF TRNQT CYL 24X4X16.5-23 (TOURNIQUET CUFF) IMPLANT
CUFF TRNQT CYL 34X4.125X (TOURNIQUET CUFF) IMPLANT
DRAPE 3/4 80X56 (DRAPES) ×2 IMPLANT
DRESSING PEEL AND PLAC PRVNA20 (GAUZE/BANDAGES/DRESSINGS) IMPLANT
DRSG MEPILEX SACRM 8.7X9.8 (GAUZE/BANDAGES/DRESSINGS) ×1 IMPLANT
DRSG NON-ADHERENT DERMACEA 3X4 (GAUZE/BANDAGES/DRESSINGS) ×1 IMPLANT
DRSG OPSITE POSTOP 4X14 (GAUZE/BANDAGES/DRESSINGS) ×1 IMPLANT
DRSG PEEL AND PLACE PREVENA 20 (GAUZE/BANDAGES/DRESSINGS) ×1
DRSG TEGADERM 4X4.75 (GAUZE/BANDAGES/DRESSINGS) ×1 IMPLANT
DURAPREP 26ML APPLICATOR (WOUND CARE) ×2 IMPLANT
ELECT CAUTERY BLADE 6.4 (BLADE) ×1 IMPLANT
ELECT REM PT RETURN 9FT ADLT (ELECTROSURGICAL) ×1
ELECTRODE REM PT RTRN 9FT ADLT (ELECTROSURGICAL) ×1 IMPLANT
GAUZE SPONGE 4X4 12PLY STRL (GAUZE/BANDAGES/DRESSINGS) ×1 IMPLANT
GLOVE BIOGEL M STRL SZ7.5 (GLOVE) ×5 IMPLANT
GLOVE BIOGEL PI IND STRL 7.5 (GLOVE) ×1 IMPLANT
GOWN STRL REUS W/ TWL LRG LVL3 (GOWN DISPOSABLE) ×2 IMPLANT
GOWN STRL REUS W/ TWL XL LVL3 (GOWN DISPOSABLE) ×1 IMPLANT
GOWN STRL REUS W/TWL LRG LVL3 (GOWN DISPOSABLE) ×1
GOWN STRL REUS W/TWL XL LVL3 (GOWN DISPOSABLE) ×1
GOWN TOGA ZIPPER T7+ PEEL AWAY (MISCELLANEOUS) ×1 IMPLANT
GRADUATE 1200CC STRL 31836 (MISCELLANEOUS) IMPLANT
HANDLE YANKAUER SUCT OPEN TIP (MISCELLANEOUS) ×1 IMPLANT
HANDPIECE VERSAJET DEBRIDEMENT (MISCELLANEOUS) IMPLANT
HEMOVAC 400CC 10FR (MISCELLANEOUS) ×1 IMPLANT
HOLDER FOLEY CATH W/STRAP (MISCELLANEOUS) ×1 IMPLANT
HOOD PEEL AWAY T7 (MISCELLANEOUS) IMPLANT
HYDROGEN PEROXIDE 16OZ (MISCELLANEOUS) IMPLANT
INSERT TIB LCS RP STD+ 10 (Knees) IMPLANT
INTRODUCER BULLET MAT STIMULAN (INTRODUCER) IMPLANT
IV NS IRRIG 3000ML ARTHROMATIC (IV SOLUTION) ×1 IMPLANT
KIT TURNOVER KIT A (KITS) ×1 IMPLANT
KNIFE SCULPS 14X20 (INSTRUMENTS) ×1 IMPLANT
MANIFOLD NEPTUNE II (INSTRUMENTS) ×2 IMPLANT
MAT ABSORB  FLUID 56X50 GRAY (MISCELLANEOUS) ×1
MAT ABSORB FLUID 56X50 GRAY (MISCELLANEOUS) IMPLANT
NDL SAFETY ECLIP 18X1.5 (MISCELLANEOUS) ×1 IMPLANT
NDL SPNL 18GX3.5 QUINCKE PK (NEEDLE) ×1 IMPLANT
NDL SPNL 20GX3.5 QUINCKE YW (NEEDLE) ×2 IMPLANT
NEEDLE SPNL 18GX3.5 QUINCKE PK (NEEDLE) IMPLANT
NEEDLE SPNL 20GX3.5 QUINCKE YW (NEEDLE) IMPLANT
NS IRRIG 1000ML POUR BTL (IV SOLUTION) ×1 IMPLANT
OIL CARTRIDGE MAESTRO DRILL (MISCELLANEOUS) ×1
PACK COLON CLEAN CLOSURE (MISCELLANEOUS) IMPLANT
PACK TOTAL KNEE (MISCELLANEOUS) ×1 IMPLANT
PAD ABD DERMACEA PRESS 5X9 (GAUZE/BANDAGES/DRESSINGS) ×2 IMPLANT
PAD WRAPON POLAR KNEE (MISCELLANEOUS) ×1 IMPLANT
PASTE BIOCOMPOSIT RPD CUR 20CC (Putty) IMPLANT
PENCIL SMOKE EVACUATOR COATED (MISCELLANEOUS) IMPLANT
PULSAVAC PLUS IRRIG FAN TIP (DISPOSABLE) ×1
RAPID CURE BIOCOMPOSITE 20CC (Putty) ×1 IMPLANT
RASP 3.0MM (RASP) IMPLANT
SOL PREP PVP 2OZ (MISCELLANEOUS)
SOLUTION IRRIG SURGIPHOR (IV SOLUTION) ×1 IMPLANT
SOLUTION PREP PVP 2OZ (MISCELLANEOUS) ×1 IMPLANT
SPONGE DRAIN TRACH 4X4 STRL 2S (GAUZE/BANDAGES/DRESSINGS) ×1 IMPLANT
SPONGE T-LAP 18X18 ~~LOC~~+RFID (SPONGE) IMPLANT
STAPLER SKIN PROX 35W (STAPLE) ×1 IMPLANT
STIMULAN BULLET MAT INTRODUCER (INTRODUCER) ×1
SUCTION FRAZIER HANDLE 10FR (MISCELLANEOUS) ×1
SUCTION TUBE FRAZIER 10FR DISP (MISCELLANEOUS) ×1 IMPLANT
SUT MNCRL 0 1X36 CT-1 (SUTURE) ×2 IMPLANT
SUT MON AB 2-0 CT1 36 (SUTURE) ×2 IMPLANT
SUT MONOCRYL 0 (SUTURE) ×1
SUT PROLENE 1 CT (SUTURE) ×2 IMPLANT
SUT PROLENE 1 CT 1 30 (SUTURE) ×1 IMPLANT
SUT VIC AB 0 CT1 36 (SUTURE) IMPLANT
SUT VIC AB 1 CT1 36 (SUTURE) IMPLANT
SUT VIC AB 2-0 CT1 (SUTURE) ×1 IMPLANT
SWAB CULTURE AMIES ANAERIB BLU (MISCELLANEOUS) IMPLANT
SYR 20ML LL LF (SYRINGE) ×1 IMPLANT
SYR 30ML LL (SYRINGE) ×2 IMPLANT
TIP BRUSH PULSAVAC PLUS 24.33 (MISCELLANEOUS) ×1 IMPLANT
TIP FAN IRRIG PULSAVAC PLUS (DISPOSABLE) ×1 IMPLANT
TOWEL OR 17X26 4PK STRL BLUE (TOWEL DISPOSABLE) ×1 IMPLANT
TOWER CARTRIDGE SMART MIX (DISPOSABLE) ×1 IMPLANT
TRAP FLUID SMOKE EVACUATOR (MISCELLANEOUS) ×1 IMPLANT
TRAY FOLEY MTR SLVR 16FR STAT (SET/KITS/TRAYS/PACK) ×1 IMPLANT
WATER STERILE IRR 1000ML POUR (IV SOLUTION) ×1 IMPLANT
WRAPON POLAR PAD KNEE (MISCELLANEOUS) ×1

## 2022-04-01 NOTE — Op Note (Signed)
OPERATIVE NOTE  DATE OF SURGERY:  04/01/2022  PATIENT NAME:  Erik Martinez   DOB: [birth year]  MRN: [id number]   PRE-OPERATIVE DIAGNOSIS: Left knee periprosthetic infection  POST-OPERATIVE DIAGNOSIS:  Same  PROCEDURE: Extensive left knee synovectomy, removal of total knee implants, placement of antibiotic impregnated methylmethacrylate articulating spacer, placement of Stimulan antibiotic beads/bullets  SURGEON:  Marciano Sequin., M.D.   ANESTHESIA: general  ESTIMATED BLOOD LOSS: 200 mL  FLUIDS REPLACED: 2600 mL of crystalloid  TOURNIQUET TIME: 120   DRAINS: Prevena wound VAC  IMPLANTS UTILIZED: DePuy 10 mm standard plus LCS RP polyethylene insert  INDICATIONS FOR SURGERY: Erik Martinez is a 67 y.o. year old male who has a history of multiple left knee procedures.  He underwent a left knee revision approximately 6 months ago and developed left knee periprosthetic infection.  Attempt at debridement and retention of the implants was not successful and the patient had recurrent infection to the knee.  After discussion of the risks and benefits of surgical intervention, the patient expressed understanding of the risks benefits and agree with plans for a two-stage procedure beginning with removal of implants and placement of an antibiotic impregnated cement spacer.Marland Kitchen   PROCEDURE IN DETAIL: The patient was brought into the operating room and, after adequate general endotracheal anesthesia was achieved, a tourniquet was placed on the patient's upper left thigh.  The patient's left knee and leg were cleaned and prepped with alcohol and DuraPrep and draped in the usual sterile fashion.  A "timeout" was performed as per usual protocol.  The left lower extremity was elevated and then the tourniquet was inflated to 300 mmHg.  An anterior curvilinear incision was made in line with the previous surgical scar.  A medial parapatellar approach was utilized with expression of copious amounts of cloudy  fluid.  Swabs were submitted for stat Gram stain and cultures.  The deep fibers of the medial collateral ligament were elevated in subperiosteal fashion off of the medial flare of the tibia so as to maintain a continuous soft tissue sleeve.  There was a pocket of purulent appearing material to the medial aspect of the knee and this was evacuated.  An extensive synovectomy was performed so as to recreate the medial and lateral gutters as well as suprapatellar pouch.  The polyethylene insert was subsequent removed and debridement of the posterior recess was also undertaken.  Attention was then directed to removal of the implants.  The implant cement bone interface was inspected and debrided of soft tissue using curettes and rongeurs.  A TPS saw was used to initially disrupt the implant cement interface of the femoral component.  Then flexible osteotomes were subsequently used such that the interface was cleared both anteriorly as well as medial and lateral aspect and posteriorly.  A clamp was applied to the femoral implant and the associated slaphammer was used to disrupt the femoral component.  Unfortunately, there was dissociation of the femoral component from the femoral sleeve and stem.  Curved thin osteotomes were used to mobilize the sleeve along its periphery.  A carbide tipped reamer was used to create a hole in the anterior aspect of the femoral sleeve and a hook was engaged and attached to the Erik Martinez.  The femoral sleeve and stem were removed with minimal bone loss.  Debridement of the bony interface was performed so as to remove any cement remnant.  The tourniquet was deflated after initial tourniquet time of 120 minutes.  Attention was then directed  to the proximal tibia.  The TPS saw was used to disrupt the implant cement interface along the tibial tray.  There were erosions along the medial aspect of the proximal tibia.  That cutting osteotomes were used to further disrupt the interface  posteriorly.  The clamp was applied to the tibial tray and tibial tray was removed without significant bone loss.  Cement splinting osteotomes were then used to disrupt the cement mantle and cement fragments were removed using pituitary rongeurs.  Both the femoral canal and the tibial canal were debrided using the back cutting scrapers.  Finally, the TPS saw was used to disrupt the bone cement interface of the patellar component.  The 4 polyethylene studs were subsequently removed without difficulty.  Residual cement was also removed.  The knee was then debrided using the Versajet starting in the medial aspect of the knee and extending into the suprapatellar pouch and down the lateral gutter.  Posterior recess was also debrided using the Versajet.  Good bleeding tissue was encountered with apparent removal of compromised tissue.  The knee was irrigated with 3 L of normal saline with antibiotic solution.  Next, 100 mL of 3% hydrogen peroxide with 100 mL of water were used to soak the knee for approximately 3 minutes.  This was followed by a second irrigation of 3 L using pulsatile lavage.  450 mL of surgery for was then used to soak the knee for approximately 5 minutes.  This was followed by a final 3 L of normal saline via pulsatile lavage.  The knee was suctioned dry.  Good hemostasis was appreciated.  20 cc of Stimulan was mixed with 1.2 g of tobramycin and 2 g of vancomycin to create 9 mm bullets that were introduced into the femoral and tibial canals.  240 g bags of polymethylmethacrylate cement were mixed with 4 g of vancomycin and 7.2 g of tobramycin and methylene blue.  The cement was placed in the femoral mold and applied to the distal femur.  The cement was allowed to cure and the mold was removed.  Margins were trimmed accordingly.  Next, a 40 g batch of polymethylmethacrylate was mixed with 2 g of vancomycin and 3.6 g of tobramycin and placed in the proximal tibia and across the tibial plateau.  A 10 mm  LCS standard plus rotating platform polyethylene was inserted and the knee was brought into full extension to allow for curing of the second batch of cement.  This allowed for good alignment.  Remaining 6 mm beads of Stimulan with antibiotics were placed in the medial and lateral gutters.  The medial parapatellar arthrotomy was repaired using interrupted sutures of #1 Prolene.  Subcutaneous tissue was approximated in layers using first #0 Monocryl followed by #2-0 Monocryl.  The skin was closed with skin staples.  A Prevena wound VAC was applied followed by application of a bulky dressing and a knee range of motion brace locked in full extension.  The patient tolerated the procedure well.  He was transported to the recovery room in stable condition.   Crystallee Werden P. Holley Bouche M.D.

## 2022-04-01 NOTE — Anesthesia Postprocedure Evaluation (Signed)
Anesthesia Post Note  Patient: Erik Martinez  Procedure(s) Performed: Removal of left total knee implants with insertion of spacer. (Left: Knee)  Patient location during evaluation: PACU Anesthesia Type: General Level of consciousness: awake and alert Pain management: pain level controlled Vital Signs Assessment: post-procedure vital signs reviewed and stable Respiratory status: spontaneous breathing, nonlabored ventilation, respiratory function stable and patient connected to nasal cannula oxygen Cardiovascular status: blood pressure returned to baseline and stable Postop Assessment: no apparent nausea or vomiting Anesthetic complications: no  No notable events documented.   Last Vitals:  Vitals:   04/01/22 2135 04/01/22 2215  BP:  127/68  Pulse: (!) 105 (!) 101  Resp: 12 18  Temp:  36.6 C  SpO2: 97% 95%    Last Pain:  Vitals:   04/01/22 2135  TempSrc:   PainSc: [place]

## 2022-04-01 NOTE — Anesthesia Preprocedure Evaluation (Signed)
Anesthesia Evaluation  Patient identified by MRN, date of birth, ID band Patient awake    Reviewed: Allergy & Precautions, NPO status , Patient's Chart, lab work & pertinent test results  History of Anesthesia Complications (+) PONV, POST - OP SPINAL HEADACHE and history of anesthetic complications  Airway Mallampati: III  TM Distance: >3 FB Neck ROM: Full    Dental  (+) Upper Dentures, Lower Dentures, Dental Advidsory Given   Pulmonary neg pulmonary ROS, neg shortness of breath, neg sleep apnea, neg recent URI, Patient abstained from smoking.Not current smoker   Pulmonary exam normal breath sounds clear to auscultation       Cardiovascular Exercise Tolerance: Good METS(-) hypertension(-) angina + Peripheral Vascular Disease  (-) CAD and (-) Past MI (-) dysrhythmias  Rhythm:Regular Rate:Tachycardia + Systolic murmurs    Neuro/Psych  Headaches PSYCHIATRIC DISORDERS Anxiety Depression Bipolar Disorder   neuropathy  Neuromuscular disease    GI/Hepatic ,GERD  Controlled,,(+)     (-) substance abuse  Tx for hep c   Endo/Other  diabetes, Well Controlled, Type 2  Ozempic, 8/28  Renal/GU Renal InsufficiencyRenal disease     Musculoskeletal  (+) Arthritis ,  On chronic opioids, about 70mg  daily oxycodone   Abdominal   Peds  Hematology   Anesthesia Other Findings Pt suspected to have a septic joint.  Past Medical History: No date: Anxiety No date: Arthritis No date: Avascular necrosis of bones of both hips (HCC) No date: Bipolar disorder (HCC) No date: Bladder spasms No date: Chronic pain of left knee No date: Chronic pain syndrome     Comment:  a.) followed by pain clinic 09/09/2015: Cocaine use disorder, moderate, dependence (HCC) No date: DDD (degenerative disc disease) No date: Depression No date: Erectile dysfunction     Comment:  a.) on PDE5i (sildenafil) No date: Frequency-urgency syndrome No date: GERD  (gastroesophageal reflux disease) No date: History of hepatitis B     Comment:  1983  TX'D--  NO ISSUES OR SYMPTOMS SINCE No date: Hypotestosteronemia 09/08/2015: 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[place]) No date: Nocturia No date: Polysubstance abuse ([place])     Comment:  a.) cocaine + marijuana + BZO + opioids No date: PONV (postoperative nausea and vomiting)     Comment:  "difficult to put me to sleep" No date: Spinal headache No date: T2DM (type 2 diabetes mellitus) ([place]) No date: Urine incontinence  Reproductive/Obstetrics                             Anesthesia Physical Anesthesia Plan  ASA: 3  Anesthesia Plan: General   Post-op Pain Management: Ofirmev IV (intra-op)* and Toradol IV (intra-op)*   Induction: Intravenous  PONV Risk Score and Plan: 4 or greater and Ondansetron, Dexamethasone, Midazolam and Treatment may vary due to age or medical condition  Airway Management Planned: Oral ETT  Additional Equipment: None  Intra-op Plan:   Post-operative Plan: Extubation in OR  Informed Consent: I have reviewed the patients History and Physical, chart, labs and discussed the procedure including the risks, benefits and alternatives for the proposed anesthesia with the patient or authorized representative who has indicated his/her understanding and acceptance.     Dental advisory given  Plan Discussed with: CRNA and Surgeon  Anesthesia Plan Comments: (Discussed risks of anesthesia with patient, including PONV, sore throat, lip/dental/eye damage. Rare risks discussed as well, such as cardiorespiratory and neurological sequelae, and allergic reactions. Discussed the role of CRNA in patient's perioperative care.  Patient understands.)        Anesthesia Quick Evaluation

## 2022-04-01 NOTE — Interval H&P Note (Signed)
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  The various methods of treatment have been discussed with the patient and family. After consideration of risks, benefits and other options for treatment, the patient has consented to  Procedure(s) with comments: Removal of left total knee implants with insertion of spacer. (Left) - RNFA as a surgical intervention.  The patient's history has been reviewed, patient examined, no change in status, stable for surgery.  I have reviewed the patient's chart and labs.  Questions were answered to the patient's satisfaction.     [place]

## 2022-04-01 NOTE — Plan of Care (Signed)

## 2022-04-01 NOTE — Anesthesia Procedure Notes (Signed)
Procedure Name: Intubation Date/Time: 04/01/2022 11:09 AM  Performed by: Fredderick Phenix, CRNAPre-anesthesia Checklist: Patient identified, Emergency Drugs available, Suction available and Patient being monitored Patient Re-evaluated:Patient Re-evaluated prior to induction Oxygen Delivery Method: Circle system utilized Preoxygenation: Pre-oxygenation with 100% oxygen Induction Type: IV induction Ventilation: Mask ventilation without difficulty Laryngoscope Size: Mac and 4 Grade View: Grade I Tube type: Oral Tube size: 7.5 mm Number of attempts: 1 Airway Equipment and Method: Stylet and Oral airway Placement Confirmation: ETT inserted through vocal cords under direct vision, positive ETCO2 and breath sounds checked- equal and bilateral Tube secured with: Tape Dental Injury: Teeth and Oropharynx as per pre-operative assessment

## 2022-04-01 NOTE — Progress Notes (Signed)
ID Pt with prosthetic left knee with MSSA infection in 2023 and was treated with IV antibiotics for 6 weeks followed by PO which he was not compliant- recurrence of infection Today he underwent prosthesis explantation and placement  of antibiotic spacer Will start nafcillin 2 grams iv q4. Communicated with Dr.Hooten Would avoid lamisil while on nafcillin to avoid any hepatic issues. Follow cbc/cmp

## 2022-04-01 NOTE — Transfer of Care (Signed)
Immediate Anesthesia Transfer of Care Note  Patient: Erik Martinez  Procedure(s) Performed: Removal of left total knee implants with insertion of spacer. (Left: Knee)  Patient Location: PACU  Anesthesia Type:General  Level of Consciousness: awake, alert , and oriented  Airway & Oxygen Therapy: Patient Spontanous Breathing and Patient connected to face mask oxygen  Post-op Assessment: Report given to RN, Post -op Vital signs reviewed and stable, and Patient moving all extremities  Post vital signs: Reviewed and stable  Last Vitals:  Vitals Value Taken Time  BP    Temp    Pulse 116 04/01/22 2050  Resp 14 04/01/22 2050  SpO2 100 % 04/01/22 2050  Vitals shown include unvalidated device data.  Last Pain:  Vitals:   04/01/22 0912  TempSrc: Temporal  PainSc: 8          Complications: No notable events documented.

## 2022-04-02 ENCOUNTER — Other Ambulatory Visit: Payer: Self-pay

## 2022-04-02 LAB — GLUCOSE, CAPILLARY
Glucose-Capillary: 214 mg/dL — ABNORMAL HIGH (ref 70–99)
Glucose-Capillary: 125 mg/dL — ABNORMAL HIGH (ref 70–99)
Glucose-Capillary: 185 mg/dL — ABNORMAL HIGH (ref 70–99)
Glucose-Capillary: 114 mg/dL — ABNORMAL HIGH (ref 70–99)

## 2022-04-02 MED ORDER — CHLORHEXIDINE GLUCONATE CLOTH 2 % EX PADS
6.0000 | MEDICATED_PAD | Freq: Every day | CUTANEOUS | Status: DC
  Administered 2022-04-02 – 2022-04-04 (×3): 6 via TOPICAL

## 2022-04-02 MED ORDER — SODIUM CHLORIDE 0.9% FLUSH: 10.0000 mL | INTRAVENOUS | Status: DC | PRN

## 2022-04-02 MED ORDER — SODIUM CHLORIDE 0.9% FLUSH
10.0000 mL | Freq: Two times a day (BID) | INTRAVENOUS | Status: DC
  Administered 2022-04-02 – 2022-04-03 (×2): 10 mL

## 2022-04-02 NOTE — Progress Notes (Signed)
Physical Therapy Treatment Patient Details Name: Erik Martinez MRN: [id number] DOB: 11/02/1955 Today's Date: 04/02/2022   History of Present Illness Pt admitted for L TKR infection and is s/p antibiotic spacer. Pt is POD 1 at time of evaluation.    PT Comments    Stands and walks 120' self selected gait distance with RW and supervision.  Some cues not to step too close into walker box but overall does well.  Returns to bed and needs in reach.  Recommendations for follow up therapy are one component of a multi-disciplinary discharge planning process, led by the attending physician.  Recommendations may be updated based on patient status, additional functional criteria and insurance authorization.  Follow Up Recommendations  Outpatient PT     Assistance Recommended at Discharge PRN  Patient can return home with the following A little help with walking and/or transfers;Help with stairs or ramp for entrance   Equipment Recommendations  Rolling walker (2 wheels)    Recommendations for Other Services       Precautions / Restrictions Precautions Precautions: Knee;Fall Precaution Booklet Issued: No Required Braces or Orthoses: Knee Immobilizer - Left Knee Immobilizer - Left: On at all times Restrictions Weight Bearing Restrictions: Yes LLE Weight Bearing: Partial weight bearing     Mobility  Bed Mobility Overal bed mobility: Independent               Patient Response: Cooperative  Transfers Overall transfer level: Independent Equipment used: Rolling walker (2 wheels)                    Ambulation/Gait Ambulation/Gait assistance: Supervision Gait Distance (Feet): 120 Feet Assistive device: Rolling walker (2 wheels) Gait Pattern/deviations: Step-to pattern Gait velocity: dec     General Gait Details: PWB with KI donned   Stairs             Wheelchair Mobility    Modified Rankin (Stroke Patients Only)       Balance Overall balance  assessment: Modified Independent                                          Cognition Arousal/Alertness: Awake/alert Behavior During Therapy: WFL for tasks assessed/performed Overall Cognitive Status: Within Functional Limits for tasks assessed                                          Exercises      General Comments        Pertinent Vitals/Pain Pain Assessment Pain Assessment: 0-10 Pain Score: 7  Pain Location: L leg Pain Descriptors / Indicators: Operative site guarding Pain Intervention(s): Limited activity within patient's tolerance, Monitored during session, Premedicated before session, Repositioned, Ice applied    Home Living Family/patient expects to be discharged to:: Private residence Living Arrangements: Alone Available Help at Discharge: Friend(s);Available PRN/intermittently Type of Home:  (large RV) Home Access: Ramped entrance       Home Layout: One level Home Equipment: Toilet riser      Prior Function            PT Goals (current goals can now be found in the care plan section) Acute Rehab PT Goals Patient Stated Goal: to go home PT Goal Formulation: With patient Time For Goal Achievement: 04/16/22 Potential to Achieve  Goals: Good Progress towards PT goals: Progressing toward goals    Frequency    BID      PT Plan      Co-evaluation              AM-PAC PT "6 Clicks" Mobility   Outcome Measure  Help needed turning from your back to your side while in a flat bed without using bedrails?: None Help needed moving from lying on your back to sitting on the side of a flat bed without using bedrails?: None Help needed moving to and from a bed to a chair (including a wheelchair)?: A Little Help needed standing up from a chair using your arms (e.g., wheelchair or bedside chair)?: A Little Help needed to walk in hospital room?: A Little Help needed climbing 3-5 steps with a railing? : A Little 6 Click  Score: 20    End of Session Equipment Utilized During Treatment: Gait belt Activity Tolerance: Patient tolerated treatment well Patient left: in chair Nurse Communication: Mobility status PT Visit Diagnosis: Muscle weakness (generalized) (M62.81);Difficulty in walking, not elsewhere classified (R26.2);Pain Pain - Right/Left: Left Pain - part of body: Knee     Time: 1300-1317 PT Time Calculation (min) (ACUTE ONLY): 17 min  Charges:  $Gait Training: 8-22 mins                    Chesley Noon, PTA 04/02/22, 1:21 PM

## 2022-04-02 NOTE — Progress Notes (Signed)
ORTHOPAEDICS: Patient up with PT. Pain under good control. I reviewed the specifics of yesterday's procedure. Questions answered.  Nafcillin started as per Dr. Vern Claude. Metabolic panel ordered for the morning. PICC line ordered for extended IV antibiotics.  Continue PT.  Laurice Record. Holley Bouche M.D.

## 2022-04-02 NOTE — Progress Notes (Signed)
Peripherally Inserted Central Catheter Placement  The IV Nurse has discussed with the patient and/or persons authorized to consent for the patient, the purpose of this procedure and the potential benefits and risks involved with this procedure.  The benefits include less needle sticks, lab draws from the catheter, and the patient may be discharged home with the catheter. Risks include, but not limited to, infection, bleeding, blood clot (thrombus formation), and puncture of an artery; nerve damage and irregular heartbeat and possibility to perform a PICC exchange if needed/ordered by physician.  Alternatives to this procedure were also discussed.  Bard Power PICC patient education guide, fact sheet on infection prevention and patient information card has been provided to patient /or left at bedside.    PICC Placement Documentation  PICC Single Lumen 04/02/22 Right Brachial 39 cm 0 cm (Active)  Indication for Insertion or Continuance of Line Home intravenous therapies (PICC only) 04/02/22 1611  Exposed Catheter (cm) 0 cm 04/02/22 1611  Site Assessment Clean, Dry, Intact 04/02/22 1611  Line Status Saline locked;Flushed;Blood return noted 04/02/22 1611  Dressing Type Transparent;Securing device 04/02/22 1611  Dressing Status Antimicrobial disc in place;Clean, Dry, Intact 04/02/22 1611  Safety Lock Not Applicable [id number]  Line Care Connections checked and tightened 04/02/22 1611  Line Adjustment (NICU/IV Team Only) No 04/02/22 1611  Dressing Intervention New dressing 04/02/22 1611  Dressing Change Due 04/09/22 04/02/22 1611       Rosalio Macadamia Chenice 04/02/2022, 4:12 PM

## 2022-04-02 NOTE — Plan of Care (Signed)
  Problem: Education: Goal: Knowledge of the prescribed therapeutic regimen will improve Outcome: Progressing Goal: Individualized Educational Video(s) Outcome: Progressing   Problem: Activity: Goal: Ability to avoid complications of mobility impairment will improve Outcome: Progressing Goal: Range of joint motion will improve Outcome: Progressing   Problem: Clinical Measurements: Goal: Postoperative complications will be avoided or minimized Outcome: Progressing   Problem: Pain Management: Goal: Pain level will decrease with appropriate interventions Outcome: Progressing   Problem: Skin Integrity: Goal: Will show signs of wound healing Outcome: Progressing   Problem: Education: Goal: Knowledge of General Education information will improve Description: Including pain rating scale, medication(s)/side effects and non-pharmacologic comfort measures Outcome: Progressing   Problem: Health Behavior/Discharge Planning: Goal: Ability to manage health-related needs will improve Outcome: Progressing   Problem: Clinical Measurements: Goal: Ability to maintain clinical measurements within normal limits will improve Outcome: Progressing Goal: Will remain free from infection Outcome: Progressing Goal: Diagnostic test results will improve Outcome: Progressing Goal: Respiratory complications will improve Outcome: Progressing Goal: Cardiovascular complication will be avoided Outcome: Progressing   Problem: Activity: Goal: Risk for activity intolerance will decrease Outcome: Progressing   Problem: Nutrition: Goal: Adequate nutrition will be maintained Outcome: Progressing   Problem: Coping: Goal: Level of anxiety will decrease Outcome: Progressing   Problem: Elimination: Goal: Will not experience complications related to bowel motility Outcome: Progressing Goal: Will not experience complications related to urinary retention Outcome: Progressing   Problem: Pain  Managment: Goal: General experience of comfort will improve Outcome: Progressing   Problem: Safety: Goal: Ability to remain free from injury will improve Outcome: Progressing   

## 2022-04-02 NOTE — Progress Notes (Signed)
OT Cancellation Note  Patient Details Name: Erik Martinez MRN: [id number] DOB: 25-May-1955   Cancelled Treatment:    Reason Eval/Treat Not Completed: OT screened, no needs identified, will sign off. Pt states he has had several knee surgeries before, has moved with PT and feels good about mobility (pending RW), has BSC and shower chair for use at home. Pt's wife in the room and stating she can assist intermittently; pt in agreement. Identified no OT needs.   Vania Rea 04/02/2022, 11:39 AM

## 2022-04-02 NOTE — Evaluation (Signed)
Physical Therapy Evaluation Patient Details Name: CORLISS HARVAN MRN: [id number] DOB: 02/16/1955 Today's Date: 04/02/2022  History of Present Illness  Pt admitted for L TKR infection and is s/p antibiotic spacer. Pt is POD 1 at time of evaluation.  Clinical Impression  Pt is a pleasant 67 year old male who was admitted for L TKR infection and is POD 1 s/p antibiotic spacer with antibiotic bead placement. Pt performs bed mobility/transfers with independence and ambulation with supervision and RW. Secondary to [facility] restrictions, will need to use RW for all mobility. Educated on technique. KI donned and locked in extension. Pt demonstrates deficits with strength/mobility/pain. Would benefit from skilled PT to address above deficits and promote optimal return to PLOF. Currently recommending OP PT for further follow up.      Recommendations for follow up therapy are one component of a multi-disciplinary discharge planning process, led by the attending physician.  Recommendations may be updated based on patient status, additional functional criteria and insurance authorization.  Follow Up Recommendations Outpatient PT      Assistance Recommended at Discharge PRN  Patient can return home with the following  A little help with walking and/or transfers;Help with stairs or ramp for entrance    Equipment Recommendations Rolling walker (2 wheels)  Recommendations for Other Services       Functional Status Assessment Patient has had a recent decline in their functional status and demonstrates the ability to make significant improvements in function in a reasonable and predictable amount of time.     Precautions / Restrictions Precautions Precautions: Knee;Fall Precaution Booklet Issued: No Required Braces or Orthoses: Knee Immobilizer - Left Knee Immobilizer - Left: On at all times Restrictions Weight Bearing Restrictions: Yes LLE Weight Bearing: Partial weight bearing      Mobility  Bed  Mobility Overal bed mobility: Independent             General bed mobility comments: safe technique with upright posture    Transfers Overall transfer level: Independent Equipment used: Rolling walker (2 wheels)               General transfer comment: safe technique and educated on [place] precautions. UPright posture noted and RW height adjusted    Ambulation/Gait Ambulation/Gait assistance: Supervision Gait Distance (Feet): 120 Feet Assistive device: Rolling walker (2 wheels) Gait Pattern/deviations: Step-to pattern       General Gait Details: educated on [place] and demonstrates safe technique. RW used. Complains of fatigue on B UE.  Stairs            Wheelchair Mobility    Modified Rankin (Stroke Patients Only)       Balance Overall balance assessment: Modified Independent                                           Pertinent Vitals/Pain Pain Assessment Pain Assessment: 0-10 Pain Score: 6  Pain Location: L leg Pain Descriptors / Indicators: Operative site guarding Pain Intervention(s): Limited activity within patient's tolerance, Repositioned, Premedicated before session, Ice applied    Home Living Family/patient expects to be discharged to:: Private residence Living Arrangements: Alone Available Help at Discharge: Friend(s);Available PRN/intermittently Type of Home:  (large RV) Home Access: Ramped entrance       Home Layout: One level Home Equipment: Toilet riser      Prior Function Prior Level of Function : Independent/Modified Independent;Working/employed;Driving  Mobility Comments: reports he has been indep previously. Reports no recent falls ADLs Comments: IND with ADLs/IADLs     Hand Dominance        Extremity/Trunk Assessment   Upper Extremity Assessment Upper Extremity Assessment: Overall WFL for tasks assessed    Lower Extremity Assessment Lower Extremity Assessment: Generalized  weakness (B LE grossly 4/5)       Communication   Communication: No difficulties  Cognition Arousal/Alertness: Awake/alert Behavior During Therapy: Impulsive Overall Cognitive Status: Within Functional Limits for tasks assessed                                 General Comments: slightly impulsive        General Comments      Exercises     Assessment/Plan    PT Assessment Patient needs continued PT services  PT Problem List Decreased strength;Decreased balance;Decreased mobility;Pain       PT Treatment Interventions DME instruction;Gait training;Stair training;Therapeutic exercise;Balance training    PT Goals (Current goals can be found in the Care Plan section)  Acute Rehab PT Goals Patient Stated Goal: to go home PT Goal Formulation: With patient Time For Goal Achievement: 04/16/22 Potential to Achieve Goals: Good    Frequency BID     Co-evaluation               AM-PAC PT "6 Clicks" Mobility  Outcome Measure Help needed turning from your back to your side while in a flat bed without using bedrails?: None Help needed moving from lying on your back to sitting on the side of a flat bed without using bedrails?: None Help needed moving to and from a bed to a chair (including a wheelchair)?: A Little Help needed standing up from a chair using your arms (e.g., wheelchair or bedside chair)?: A Little Help needed to walk in hospital room?: A Little Help needed climbing 3-5 steps with a railing? : A Little 6 Click Score: 20    End of Session Equipment Utilized During Treatment: Gait belt Activity Tolerance: Patient tolerated treatment well Patient left: in chair Nurse Communication: Mobility status PT Visit Diagnosis: Muscle weakness (generalized) (M62.81);Difficulty in walking, not elsewhere classified (R26.2);Pain Pain - Right/Left: Left Pain - part of body: Knee    Time: [id number] PT Time Calculation (min) (ACUTE ONLY): 32 min   Charges:    PT Evaluation $PT Eval Low Complexity: 1 Low PT Treatments $Gait Training: 8-22 mins        Greggory Stallion, PT, DPT, GCS [phone]   Maxximus Gotay 04/02/2022, 12:44 PM

## 2022-04-02 NOTE — TOC Progression Note (Signed)
Transition of Care [facility]) - Progression Note    Patient Details  Name: Erik Martinez MRN: [id number] Date of Birth: 1955/12/22  [facility] CM/SW Contact  Izola Price, RN Phone Number: 04/02/2022, 3:10 PM  Clinical Narrative:  04/02/22: 3/23: Left TKR infection and s/p antibiotic spacer. Pt is POD #1. On IV Abx per ID. Was on [facility] list prior to surgery.  PT recommendation is now for outpatient therapy. However, PICC Line placed for extended Abx therapy per ID. Wound vac in place as well. TOC will continue to monitor for discharge plan. [name] RN CM         Expected Discharge Plan and Services                                               Social Determinants of Health (SDOH) Interventions SDOH Screenings   Food Insecurity: No Food Insecurity (04/01/2022)  Housing: Low Risk  (04/01/2022)  Transportation Needs: No Transportation Needs (04/01/2022)  Utilities: Not At Risk (04/01/2022)  Depression (PHQ2-9): Low Risk  (01/28/2022)  Financial Resource Strain: Low Risk  (01/28/2022)  Physical Activity: Inactive (01/28/2022)  Social Connections: Moderately Isolated (01/28/2022)  Stress: No Stress Concern Present (01/28/2022)  Tobacco Use: Low Risk  (04/01/2022)    Readmission Risk Interventions     No data to display

## 2022-04-02 NOTE — Progress Notes (Signed)
  Subjective: 1 Day Post-Op Procedure(s) (LRB): Removal of left total knee implants with insertion of spacer. (Left) Patient reports pain as mild.   Patient is well, and has had no acute complaints or problems Plan is to go Home versus rehab after hospital stay. Negative for chest pain and shortness of breath Fever: no Gastrointestinal: Negative for nausea and vomiting  Objective: Vital signs in last 24 hours: Temp:  [97.1 F (36.2 C)-98.3 F (36.8 C)] 98.3 F (36.8 C) (03/23 0446) Pulse Rate:  [91-116] 96 (03/23 0446) Resp:  [12-18] 18 (03/23 0446) BP: (124-136)/(62-78) 129/62 (03/23 0446) SpO2:  [95 %-100 %] 98 % (03/23 0446) Weight:  [83.9 kg] 83.9 kg (03/22 0912)  Intake/Output from previous day:  Intake/Output Summary (Last 24 hours) at 04/02/2022 0733 Last data filed at 04/02/2022 0630 Gross per 24 hour  Intake 3384.24 ml  Output 2075 ml  Net 1309.24 ml    Intake/Output this shift: No intake/output data recorded.  Labs: No results for input(s): "HGB" in the last 72 hours. No results for input(s): "WBC", "RBC", "HCT", "PLT" in the last 72 hours. No results for input(s): "NA", "K", "CL", "CO2", "BUN", "CREATININE", "GLUCOSE", "CALCIUM" in the last 72 hours. No results for input(s): "LABPT", "INR" in the last 72 hours.   EXAM General - Patient is Alert and Oriented Extremity - Neurovascular intact Dorsiflexion/Plantar flexion intact Compartment soft Dressing/Incision - clean, dry, with the wound VAC in place.  The knee immobilizer was locked in extension. Motor Function - intact, moving foot and toes well on exam.   Past Medical History:  Diagnosis Date   Anxiety    Arthritis    Avascular necrosis of bones of both hips (HCC)    Bipolar disorder (HCC)    Bladder spasms    Chronic pain of left knee    Chronic pain syndrome    a.) followed by pain clinic   Cocaine use disorder, moderate, dependence ([place]) 09/09/2015   DDD (degenerative disc disease)     Depression    Erectile dysfunction    a.) on PDE5i (sildenafil)   Frequency-urgency syndrome    GERD (gastroesophageal reflux disease)    Hepatitis C 2018   History of hepatitis B    1983  TX'D--  NO ISSUES OR SYMPTOMS SINCE   Hyperlipidemia    Hypotestosteronemia    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   Weight as of this encounter: 83.9 kg. Advance diet Up with therapy  DVT Prophylaxis - Lovenox, Foot Pumps, and TED hose Partial Weight-Bearing to left leg  Reche Dixon, PA-C Orthopaedic Surgery 04/02/2022, 7:33 AM

## 2022-04-03 LAB — BASIC METABOLIC PANEL
Anion gap: 4 — ABNORMAL LOW (ref 5–15)
BUN: 32 mg/dL — ABNORMAL HIGH (ref 8–23)
CO2: 27 mmol/L (ref 22–32)
Calcium: 8.2 mg/dL — ABNORMAL LOW (ref 8.9–10.3)
Chloride: 108 mmol/L (ref 98–111)
Creatinine, Ser: 1.64 mg/dL — ABNORMAL HIGH (ref 0.61–1.24)
GFR, Estimated: 46 mL/min — ABNORMAL LOW (ref >60)
Glucose, Bld: 127 mg/dL — ABNORMAL HIGH (ref 70–99)
Potassium: 4.4 mmol/L (ref 3.5–5.1)
Sodium: 139 mmol/L (ref 135–145)

## 2022-04-03 LAB — PREPARE RBC (CROSSMATCH)

## 2022-04-03 LAB — GLUCOSE, CAPILLARY
Glucose-Capillary: 130 mg/dL — ABNORMAL HIGH (ref 70–99)
Glucose-Capillary: 167 mg/dL — ABNORMAL HIGH (ref 70–99)
Glucose-Capillary: 142 mg/dL — ABNORMAL HIGH (ref 70–99)
Glucose-Capillary: 98 mg/dL (ref 70–99)

## 2022-04-03 LAB — CBC
HCT: 22.6 % — ABNORMAL LOW (ref 39.0–52.0)
Hemoglobin: 7 g/dL — ABNORMAL LOW (ref 13.0–17.0)
MCH: 24.3 pg — ABNORMAL LOW (ref 26.0–34.0)
MCHC: 31 g/dL (ref 30.0–36.0)
MCV: 78.5 fL — ABNORMAL LOW (ref 80.0–100.0)
Platelets: 191 10*3/uL (ref 150–400)
RBC: 2.88 MIL/uL — ABNORMAL LOW (ref 4.22–5.81)
RDW: 16.2 % — ABNORMAL HIGH (ref 11.5–15.5)
WBC: 4.7 10*3/uL (ref 4.0–10.5)
nRBC: 0 % (ref 0.0–0.2)

## 2022-04-03 MED ORDER — SODIUM CHLORIDE 0.9% IV SOLUTION: Freq: Once | INTRAVENOUS | Status: AC

## 2022-04-03 NOTE — Plan of Care (Signed)
  Problem: Education: Goal: Knowledge of the prescribed therapeutic regimen will improve Outcome: Progressing   Problem: Activity: Goal: Ability to avoid complications of mobility impairment will improve Outcome: Progressing Goal: Range of joint motion will improve Outcome: Progressing   Problem: Clinical Measurements: Goal: Postoperative complications will be avoided or minimized Outcome: Progressing   Problem: Pain Management: Goal: Pain level will decrease with appropriate interventions Outcome: Progressing   Problem: Skin Integrity: Goal: Will show signs of wound healing Outcome: Progressing   Problem: Education: Goal: Knowledge of General Education information will improve Description: Including pain rating scale, medication(s)/side effects and non-pharmacologic comfort measures Outcome: Progressing   Problem: Health Behavior/Discharge Planning: Goal: Ability to manage health-related needs will improve Outcome: Progressing   Problem: Clinical Measurements: Goal: Ability to maintain clinical measurements within normal limits will improve Outcome: Progressing Goal: Will remain free from infection Outcome: Progressing Goal: Diagnostic test results will improve Outcome: Progressing Goal: Respiratory complications will improve Outcome: Progressing Goal: Cardiovascular complication will be avoided Outcome: Progressing   Problem: Activity: Goal: Risk for activity intolerance will decrease Outcome: Progressing   Problem: Nutrition: Goal: Adequate nutrition will be maintained Outcome: Progressing   Problem: Coping: Goal: Level of anxiety will decrease Outcome: Progressing   Problem: Elimination: Goal: Will not experience complications related to bowel motility Outcome: Progressing Goal: Will not experience complications related to urinary retention Outcome: Progressing   Problem: Pain Managment: Goal: General experience of comfort will improve Outcome:  Progressing   Problem: Safety: Goal: Ability to remain free from injury will improve Outcome: Progressing   Problem: Skin Integrity: Goal: Risk for impaired skin integrity will decrease Outcome: Progressing   

## 2022-04-03 NOTE — Plan of Care (Signed)

## 2022-04-03 NOTE — Discharge Summary (Signed)
Physician Discharge Summary  Subjective: 3 Days Post-Op Procedure(s) (LRB): Removal of left total knee implants with insertion of spacer. (Left) Patient reports pain as mild to moderate.  Patient is well, and has had no acute complaints or problems Patient is ready to go home with IV antibiotics  Physician Discharge Summary  Patient ID: Erik Martinez MRN: [id number] DOB/AGE: 02-27-1955 67 y.o.  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  The patient received a PICC line on Saturday for IV antibiotic therapy when he goes home.  The patient has a wound VAC currently on the left lower extremity without any drainage noted.  The patient ambulated 200 feet on Sunday with physical therapy.  The patient had a hemoglobin of 7.0 on Sunday and received 1 unit of transfused blood, Hg 8.4 on 04/04/22.  The patient will be ready to go home after infectious disease sets of the IV antibiotic therapy at home.  The patient will receive nafcillin IV antibiotics.  Treatments: surgery:   Extensive left knee synovectomy, removal of total knee implants, placement of antibiotic impregnated methylmethacrylate articulating spacer, placement of Stimulan antibiotic beads/bullets   SURGEON:  Marciano Sequin., M.D.           ANESTHESIA: general   ESTIMATED BLOOD LOSS: 200 mL   FLUIDS REPLACED: 2600 mL of crystalloid   TOURNIQUET TIME: 120    DRAINS: Prevena wound VAC   IMPLANTS UTILIZED: DePuy 10 mm standard plus LCS RP polyethylene insert  Discharge Exam: Blood pressure (!) 186/82, pulse 98, temperature 98.3 F (36.8 C), resp. rate 18, height 5\' 10"  (1.778 m), weight 83.9 kg, SpO2 99 %.   Disposition: Plan for discharge home  today with IV antibiotics per ID.  Discharge Instructions     [facility] pharmacist to adjust dose for Vancomycin, Aminoglycosides and other anti-infective therapies as requested by physician.   Complete by: As directed    [facility] to provide Cath Flo 2mg    Complete by: As directed    Administer for PICC line occlusion and as ordered by physician for other access device issues.   Anaphylaxis Kit: Provided to treat any anaphylactic reaction to the medication being provided to the patient if First Dose or when requested by physician   Complete by: As directed    Epinephrine 1mg /ml vial / amp: Administer 0.3mg  (0.88ml) subcutaneously once for moderate to severe anaphylaxis, nurse to call physician and pharmacy when reaction occurs and call 911 if needed for immediate care   Diphenhydramine 50mg /ml IV vial: Administer 25-50mg  IV/IM PRN for first dose reaction, rash, itching, mild reaction, nurse to call physician and pharmacy when reaction occurs   Sodium Chloride 0.9% NS 561ml IV: Administer if needed for hypovolemic blood pressure drop or as ordered by physician after call to physician with anaphylactic reaction   Change dressing on IV access line weekly and PRN   Complete by: As directed    Flush IV access with Sodium Chloride 0.9% and Heparin 10 units/ml or 100 units/ml   Complete by: As directed    Home infusion instructions - [facility]   Complete by: As directed    Instructions: Flush IV access with Sodium Chloride 0.9% and Heparin 10units/ml or 100units/ml  Change dressing on IV access line: Weekly and PRN   Instructions Cath Flo 2mg : Administer for PICC Line occlusion and as ordered by physician for other access device   [facility] pharmacist to adjust dose for: Vancomycin, Aminoglycosides and other anti-infective therapies as requested by physician   Method of administration may be changed at the discretion of home infusion pharmacist  based upon assessment of the patient and/or caregiver's ability to self-administer the medication ordered   Complete by: As directed       Allergies as of 04/04/2022       Reactions   Suboxone [buprenorphine Hcl-naloxone Hcl] Nausea And Vomiting   Tape Other (See Comments)   Whelps *Paper Tape is ok    Codeine Rash, Itching, Nausea Only   Morphine And Related Itching, Rash   Other Rash   Telemetry electrodes   Silicone Rash   Whelps -*Paper Tape is ok         Medication List     STOP taking these medications    cephALEXin 500 MG capsule Commonly known as: Keflex   terbinafine 250 MG tablet Commonly known as: LamISIL       TAKE these medications    acetaminophen 500 MG tablet Commonly known as: TYLENOL Take 1,500 mg by mouth 2 (two) times daily as needed for moderate pain or mild pain.   aspirin EC 81 MG tablet Take 81 mg by mouth daily. Swallow whole.   atorvastatin 20 MG tablet Commonly known as: LIPITOR Take 20 mg by mouth at bedtime.   celecoxib 200 MG capsule Commonly known as: CELEBREX Take 200 mg by mouth 2 (two) times daily.   cyclobenzaprine 10 MG tablet Commonly known as: FLEXERIL SMARTSIG:1 Tablet(s) By Mouth Every 12 Hours   empagliflozin 25 MG Tabs tablet Commonly known as: JARDIANCE Take 1 tablet (25 mg total) by mouth daily.   enoxaparin 40 MG/0.4ML injection Commonly known as: LOVENOX Inject 0.4 mLs (40 mg total) into the skin daily.   ferrous sulfate 324 MG Tbec TAKE ONE TABLET BY MOUTH MONDAY,WEDNESDAY,FRIDAY   lidocaine 5 % Commonly known as: Lidoderm Place 1 patch onto the skin daily. Remove & Discard patch within 12 hours or as directed by MD   metFORMIN 1000 MG tablet Commonly known as: GLUCOPHAGE Take 1,000 mg by mouth 2 (two) times daily.   multivitamin with minerals Tabs tablet Take 1 tablet by mouth in the morning.   nafcillin  IVPB Inject 12 g into the vein continuous. Infuse Nafcillin 12gm daily as continuous  infusion over 24 hours.   Indication:  prosthetic joint infection of left knee First Dose: Yes Last Day of Therapy:  05/13/2022 Labs - Once weekly:  CBC/D, CMP, ESR and CRP Please pull PIC at completion of IV antibiotics Fax weekly lab results  promptly to [phone] Method of administration: Elastomeric (Continuous infusion) Method of administration may be changed at the discretion of home infusion pharmacist based upon assessment of the patient and/or caregiver's ability to self-administer the medication ordered.   ondansetron 4 MG tablet Commonly known as: Zofran Take 1 tablet (4 mg total) by mouth every 8 (eight) hours as needed for nausea or vomiting.   oxyCODONE 15 MG immediate release tablet Commonly known as: ROXICODONE Take 15 mg by mouth every 6 (six) hours. What changed: Another medication with the same name was added. Make sure you understand how and when to take each.   oxyCODONE 5 MG immediate release tablet Commonly known as: Oxy  IR/ROXICODONE Take 1-2 tablets (5-10 mg total) by mouth every 4 (four) hours as needed for breakthrough pain. What changed: You were already taking a medication with the same name, and this prescription was added. Make sure you understand how and when to take each.   Ozempic (0.25 or 0.5 MG/DOSE) 2 MG/3ML Sopn Generic drug: Semaglutide(0.25 or 0.5MG /DOS) Inject 0.5 mg into the skin once a week. Monday   pantoprazole 40 MG tablet Commonly known as: Protonix Take 1 tablet (40 mg total) by mouth daily.   pregabalin 200 MG capsule Commonly known as: Lyrica Take 1 capsule (200 mg total) by mouth in the morning, at noon, and at bedtime.   tadalafil 20 MG tablet Commonly known as: CIALIS Take 20 mg by mouth daily as needed for erectile dysfunction.   tiZANidine 4 MG tablet Commonly known as: ZANAFLEX Take 4 mg by mouth 3 (three) times daily as needed.   zolpidem 10 MG tablet Commonly known as: AMBIEN Take 1 tablet (10 mg total) by  mouth at bedtime as needed. for sleep               Durable Medical Equipment  (From admission, onward)           Start     Ordered   04/01/22 2225  DME Walker rolling  Once       Question:  Patient needs a walker to treat with the following condition  Answer:  Total knee replacement status   04/01/22 2224   04/01/22 2225  DME Bedside commode  Once       Comments: Patient is not able to walk the distance required to go the bathroom, or he/she is unable to safely negotiate stairs required to access the bathroom.  A 3in1 BSC will alleviate this problem  Question:  Patient needs a bedside commode to treat with the following condition  Answer:  Total knee replacement status   04/01/22 2224              Discharge Care Instructions  (From admission, onward)           Start     Ordered   04/04/22 0000  Change dressing on IV access line weekly and PRN  (Home infusion instructions - [facility] )        04/04/22 1204            Follow-up Information     Watt Climes, PA Follow up on 04/18/2022.   Specialty: Physician Assistant Why: at 9:15am Contact information: [place] [address] [phone]         [name], MD Follow up on 05/12/2022.   Specialty: Orthopedic Surgery Why: at 2:45pm Contact information: [place] [address] [phone]                Signed: Judson Roch PA-C 04/04/2022, 12:04 PM   Objective: Vital signs in last 24 hours: Temp:  [97.6 F (36.4 C)-98.8 F (37.1 C)] 98.3 F (36.8 C) (03/25 0749) Pulse Rate:  [96-106] 98 (03/25 0749) Resp:  [16-18] 18 (03/25 0749) BP: (139-186)/(82-91) 186/82 (03/25 0749) SpO2:  [98 %-100 %] 99 % (03/25 0749)  Intake/Output from previous day:  Intake/Output Summary (Last 24 hours) at 04/04/2022 1204 Last data filed at 04/04/2022 1048 Gross per 24 hour  Intake 540 ml  Output 2400 ml  Net -1860 ml     Intake/Output this shift: Total  I/O In: -  Out: 300 [Urine:300]  Labs: Recent Labs    04/03/22 0430 04/04/22 0600 04/04/22 0744  HGB 7.0* 8.4* 8.5*   Recent Labs    04/03/22 0430 04/04/22 0744  WBC 4.7 4.5  RBC 2.88* 3.42*  HCT 22.6* 26.8*  PLT 191 208   Recent Labs    04/03/22 0430 04/04/22 0744  NA 139 139  K 4.4 4.1  CL 108 99  CO2 27 27  BUN 32* 22  CREATININE 1.64* 1.25*  GLUCOSE 127* 182*  CALCIUM 8.2* 9.4   No results for input(s): "LABPT", "INR" in the last 72 hours.  EXAM: General - Patient is Alert and Oriented Extremity - Neurovascular intact Sensation intact distally Dorsiflexion/Plantar flexion intact Compartment soft Incision - clean, dry, with the wound VAC in place Motor Function -plantarflexion and dorsiflexion are intact.  Ambulated 200 feet with his brace locked in extension.  Assessment/Plan: 3 Days Post-Op Procedure(s) (LRB): Removal of left total knee implants with insertion of spacer. (Left) Procedure(s) (LRB): Removal of left total knee implants with insertion of spacer. (Left) Past Medical History:  Diagnosis Date   Anxiety    Arthritis    Avascular necrosis of bones of both hips (HCC)    Bipolar disorder (HCC)    Bladder spasms    Chronic pain of left knee    Chronic pain syndrome    a.) followed by pain clinic   Cocaine use disorder, moderate, dependence ([place]) 09/09/2015   DDD (degenerative disc disease)    Depression    Erectile dysfunction    a.) on PDE5i (sildenafil)   Frequency-urgency syndrome    GERD (gastroesophageal reflux disease)    Hepatitis C 2018   History of hepatitis B    1983  TX'D--  NO ISSUES OR SYMPTOMS SINCE   Hyperlipidemia    Hypotestosteronemia    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   Weight as of this encounter: 83.9 kg. Advance diet Up with therapy Diet - Regular diet Follow up - in 2 weeks Activity - PWB Disposition - Home Condition Upon Discharge - Fair DVT Prophylaxis - Lovenox and TED hose  J. Cameron Proud, PA-C Orthopaedic Surgery 04/04/2022, 12:04 PM

## 2022-04-03 NOTE — Progress Notes (Addendum)
Subjective: 2 Days Post-Op Procedure(s) (LRB): Removal of left total knee implants with insertion of spacer. (Left) Patient reports pain as mild to moderate.  The patient had a PICC line placed yesterday.  Receiving IV antibiotics. Patient is well, and has had no acute complaints or problems Plan is to go Home after hospital stay. Negative for chest pain and shortness of breath Fever: no Gastrointestinal: Negative for nausea and vomiting  Objective: Vital signs in last 24 hours: Temp:  [97.5 F (36.4 C)-97.9 F (36.6 C)] 97.9 F (36.6 C) (03/23 2348) Pulse Rate:  [71-86] 86 (03/23 2348) Resp:  [18] 18 (03/23 2348) BP: (111-144)/(62-75) 144/75 (03/23 2348) SpO2:  [100 %] 100 % (03/23 2348)  Intake/Output from previous day:  Intake/Output Summary (Last 24 hours) at 04/03/2022 0739 Last data filed at 04/03/2022 0649 Gross per 24 hour  Intake --  Output 1775 ml  Net -1775 ml    Intake/Output this shift: No intake/output data recorded.  Labs: Recent Labs    04/03/22 0430  HGB 7.0*   Recent Labs    04/03/22 0430  WBC 4.7  RBC 2.88*  HCT 22.6*  PLT 191   Recent Labs    04/03/22 0430  NA 139  K 4.4  CL 108  CO2 27  BUN 32*  CREATININE 1.64*  GLUCOSE 127*  CALCIUM 8.2*   No results for input(s): "LABPT", "INR" in the last 72 hours.   EXAM General - Patient is Alert and Oriented Extremity - Neurovascular intact Dorsiflexion/Plantar flexion intact Compartment soft Dressing/Incision - clean, dry, with the wound VAC in place.  The knee immobilizer was locked in extension.  The external bandage was removed with no complication. Motor Function - intact, moving foot and toes well on exam.  Patient ambulated 120 feet.  Past Medical History:  Diagnosis Date   Anxiety    Arthritis    Avascular necrosis of bones of both hips (HCC)    Bipolar disorder (HCC)    Bladder spasms    Chronic pain of left knee    Chronic pain syndrome    a.) followed by pain clinic    Cocaine use disorder, moderate, dependence ([place]) 09/09/2015   DDD (degenerative disc disease)    Depression    Erectile dysfunction    a.) on PDE5i (sildenafil)   Frequency-urgency syndrome    GERD (gastroesophageal reflux disease)    Hepatitis C 2018   History of hepatitis B    1983  TX'D--  NO ISSUES OR SYMPTOMS SINCE   Hyperlipidemia    Hypotestosteronemia    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   Weight as of this encounter: 83.9 kg. Advance diet Up with therapy  Acute blood loss anemia.  Postsurgical.  Hemoglobin 7.0 this morning.  Originally 10.5 on March 18.  Will transfuse 1 unit of transfused blood today.  Discharge plan: Patient will receive 1 unit of transfused blood today with a recheck tomorrow morning.  Plan to discuss IV antibiotic therapy with infectious disease tomorrow morning.  Patient will be discharged home with PICC line and IV antibiotic therapy.  Patient will be discharged with Prevena wound VAC.  DVT Prophylaxis - Lovenox, Foot Pumps, and TED hose Partial Weight-Bearing to left leg  Reche Dixon, PA-C Orthopaedic Surgery 04/03/2022, 7:39 AM  Patient seen and examined, agree with above plan.  The patient is doing well status post left knee explant and antibiotic spacer placement, some post operative anemia  discussed blood transfusion with patient who agrees with plan for transfusion. Vitals currently stable. Will re-check labs in the morning. no other concerns at this time.  Pain is controlled.  Discussed DVT prophylaxis, pain medication use, and safe transition to home. Plan for antibiotics per ID, currently on nafcillin pending additional culture results. PICC line successfully placed yesterday. All questions answered the patient agrees with above plan will go home after home infusion services setup by ID.    Steffanie Rainwater MD

## 2022-04-03 NOTE — Progress Notes (Signed)
Physical Therapy Treatment Patient Details Name: Erik Martinez MRN: [id number] DOB: 1955-09-10 Today's Date: 04/03/2022   History of Present Illness Pt admitted for L TKR infection and is s/p antibiotic spacer. Pt is POD 1 at time of evaluation.    PT Comments    Blood completed this am.  Ready for session and asymptomatic,  he is able to transition OOB and complete x 1 lap on unit with RW and PWB to NWB - self selected with no LOB and steady gait.  Pt does c/o pressure medial knee at brace.  It is removed and skin observed.  He does have a pocket of fluid where hinge of brace sets and irritating.  Pt pulls up picture of prior fluid filled blister and while it is improved he stated it is "filling back up" and causing pain from brace.  No redness from brace noted today.  Brace is adjusted and foam cuffs are quite large for him and he is unable to don/doff brace on his own.  They are cut back so they are more manageable for pt as he will not have help at home.  Discussed probable need to adjust brace daily due to edema and movement that will cause brace to slide.  Brace is refitted to pt for best fit and a small foam donut is left to help pad area of soreness.  Pt reports brace does feel better.  Will continue to monitor and will discuss with MD tomorrow if it is still causing issues.  Pt encouraged to talk with MD also upon visit if problems remain.   Recommendations for follow up therapy are one component of a multi-disciplinary discharge planning process, led by the attending physician.  Recommendations may be updated based on patient status, additional functional criteria and insurance authorization.  Follow Up Recommendations  Outpatient PT     Assistance Recommended at Discharge PRN  Patient can return home with the following A little help with walking and/or transfers;Help with stairs or ramp for entrance   Equipment Recommendations  Rolling walker (2 wheels)    Recommendations for  Other Services       Precautions / Restrictions Precautions Precautions: Knee;Fall Precaution Booklet Issued: No Required Braces or Orthoses: Knee Immobilizer - Left Knee Immobilizer - Left: On at all times Restrictions Weight Bearing Restrictions: Yes LLE Weight Bearing: Partial weight bearing     Mobility  Bed Mobility Overal bed mobility: Independent               Patient Response: Cooperative  Transfers Overall transfer level: Independent Equipment used: Rolling walker (2 wheels)                    Ambulation/Gait Ambulation/Gait assistance: Supervision Gait Distance (Feet): 200 Feet Assistive device: Rolling walker (2 wheels) Gait Pattern/deviations: Step-to pattern Gait velocity: dec     General Gait Details: PWB with KI donned   Stairs             Wheelchair Mobility    Modified Rankin (Stroke Patients Only)       Balance Overall balance assessment: Modified Independent                                          Cognition Arousal/Alertness: Awake/alert Behavior During Therapy: WFL for tasks assessed/performed Overall Cognitive Status: Within Functional Limits for tasks assessed  Exercises      General Comments        Pertinent Vitals/Pain Pain Assessment Pain Assessment: Faces Faces Pain Scale: Hurts little more Pain Location: L leg Pain Descriptors / Indicators: Operative site guarding Pain Intervention(s): Limited activity within patient's tolerance, Repositioned, Monitored during session, Ice applied    Home Living                          Prior Function            PT Goals (current goals can now be found in the care plan section) Progress towards PT goals: Progressing toward goals    Frequency    BID      PT Plan      Co-evaluation              AM-PAC PT "6 Clicks" Mobility   Outcome Measure  Help needed  turning from your back to your side while in a flat bed without using bedrails?: None Help needed moving from lying on your back to sitting on the side of a flat bed without using bedrails?: None Help needed moving to and from a bed to a chair (including a wheelchair)?: A Little Help needed standing up from a chair using your arms (e.g., wheelchair or bedside chair)?: None Help needed to walk in hospital room?: A Little Help needed climbing 3-5 steps with a railing? : A Little 6 Click Score: 21    End of Session Equipment Utilized During Treatment: Gait belt Activity Tolerance: Patient tolerated treatment well Patient left: in chair Nurse Communication: Mobility status PT Visit Diagnosis: Muscle weakness (generalized) (M62.81);Difficulty in walking, not elsewhere classified (R26.2);Pain Pain - Right/Left: Left Pain - part of body: Knee     Time: 1329-1415 PT Time Calculation (min) (ACUTE ONLY): 46 min  Charges:  $Gait Training: 8-22 mins $Therapeutic Activity: 23-37 mins                   Chesley Noon, PTA 04/03/22, 2:22 PM

## 2022-04-04 ENCOUNTER — Telehealth: Payer: Self-pay | Admitting: Family Medicine

## 2022-04-04 ENCOUNTER — Encounter: Payer: Self-pay | Admitting: Orthopedic Surgery

## 2022-04-04 ENCOUNTER — Ambulatory Visit: Payer: Self-pay

## 2022-04-04 DIAGNOSIS — Z9889 Other specified postprocedural states: Secondary | ICD-10-CM | POA: Diagnosis not present

## 2022-04-04 DIAGNOSIS — T8454XA Infection and inflammatory reaction due to internal left knee prosthesis, initial encounter: Secondary | ICD-10-CM | POA: Diagnosis not present

## 2022-04-04 DIAGNOSIS — Z8619 Personal history of other infectious and parasitic diseases: Secondary | ICD-10-CM

## 2022-04-04 LAB — CBC WITH DIFFERENTIAL/PLATELET
Abs Immature Granulocytes: 0.02 10*3/uL (ref 0.00–0.07)
Basophils Absolute: 0 10*3/uL (ref 0.0–0.1)
Basophils Relative: 0 %
Eosinophils Absolute: 0.1 10*3/uL (ref 0.0–0.5)
Eosinophils Relative: 3 %
HCT: 26.8 % — ABNORMAL LOW (ref 39.0–52.0)
Hemoglobin: 8.5 g/dL — ABNORMAL LOW (ref 13.0–17.0)
Immature Granulocytes: 0 %
Lymphocytes Relative: 19 %
Lymphs Abs: 0.9 10*3/uL (ref 0.7–4.0)
MCH: 24.9 pg — ABNORMAL LOW (ref 26.0–34.0)
MCHC: 31.7 g/dL (ref 30.0–36.0)
MCV: 78.4 fL — ABNORMAL LOW (ref 80.0–100.0)
Monocytes Absolute: 0.3 10*3/uL (ref 0.1–1.0)
Monocytes Relative: 7 %
Neutro Abs: 3.2 10*3/uL (ref 1.7–7.7)
Neutrophils Relative %: 71 %
Platelets: 208 10*3/uL (ref 150–400)
RBC: 3.42 MIL/uL — ABNORMAL LOW (ref 4.22–5.81)
RDW: 16.3 % — ABNORMAL HIGH (ref 11.5–15.5)
WBC: 4.5 10*3/uL (ref 4.0–10.5)
nRBC: 0 % (ref 0.0–0.2)

## 2022-04-04 LAB — COMPREHENSIVE METABOLIC PANEL
ALT: 10 U/L (ref 0–44)
AST: 21 U/L (ref 15–41)
Albumin: 3 g/dL — ABNORMAL LOW (ref 3.5–5.0)
Alkaline Phosphatase: 71 U/L (ref 38–126)
Anion gap: 13 (ref 5–15)
BUN: 22 mg/dL (ref 8–23)
CO2: 27 mmol/L (ref 22–32)
Calcium: 9.4 mg/dL (ref 8.9–10.3)
Chloride: 99 mmol/L (ref 98–111)
Creatinine, Ser: 1.25 mg/dL — ABNORMAL HIGH (ref 0.61–1.24)
GFR, Estimated: >60 mL/min (ref >60)
Glucose, Bld: 182 mg/dL — ABNORMAL HIGH (ref 70–99)
Potassium: 4.1 mmol/L (ref 3.5–5.1)
Sodium: 139 mmol/L (ref 135–145)
Total Bilirubin: 0.9 mg/dL (ref 0.3–1.2)
Total Protein: 7.4 g/dL (ref 6.5–8.1)

## 2022-04-04 LAB — GLUCOSE, CAPILLARY
Glucose-Capillary: 130 mg/dL — ABNORMAL HIGH (ref 70–99)
Glucose-Capillary: 132 mg/dL — ABNORMAL HIGH (ref 70–99)

## 2022-04-04 LAB — TYPE AND SCREEN
ABO/RH(D): A POS
Antibody Screen: NEGATIVE
Unit division: 0

## 2022-04-04 LAB — BPAM RBC
Blood Product Expiration Date: 202404182359
ISSUE DATE / TIME: 202403240850
Unit Type and Rh: 6200

## 2022-04-04 LAB — HEMOGLOBIN: Hemoglobin: 8.4 g/dL — ABNORMAL LOW (ref 13.0–17.0)

## 2022-04-04 MED ORDER — OXYCODONE HCL 5 MG PO TABS: 5.0000 mg | ORAL_TABLET | ORAL | 0 refills | Status: DC | PRN

## 2022-04-04 MED ORDER — ENOXAPARIN SODIUM 40 MG/0.4ML IJ SOSY: 40.0000 mg | PREFILLED_SYRINGE | INTRAMUSCULAR | 0 refills | Status: DC

## 2022-04-04 MED ORDER — SODIUM CHLORIDE 0.9 % IV SOLN
2.0000 g | INTRAVENOUS | Status: DC
  Administered 2022-04-04 (×3): 2 g via INTRAVENOUS
  Filled 2022-04-04: qty 2
  Filled 2022-04-04 (×2): qty 8
  Filled 2022-04-04: qty 2
  Filled 2022-04-04: qty 8
  Filled 2022-04-04: qty 2

## 2022-04-04 MED ORDER — NAFCILLIN IV (FOR PTA / DISCHARGE USE ONLY): 12.0000 g | INTRAVENOUS | 0 refills | Status: AC

## 2022-04-04 NOTE — Chronic Care Management (AMB) (Signed)
   04/04/2022  Erik Martinez February 05, 1955 [id number]   Reason for Encounter: Change CCM status to previously enrolled.   Horris Latino RN Care Manager/Chronic Care Management [phone]

## 2022-04-04 NOTE — TOC Progression Note (Signed)
Transition of Care [facility]) - CM/SW Discharge Note   Patient Details  Name: Erik Martinez MRN: [id number] Date of Birth: 1955/11/25  [facility] CM/SW Contact:  Conception Oms, RN Phone Number: 04/04/2022, 12:17 PM   Clinical Narrative:    Jeannene Patella with [employer] will come and do teaching for IV Infuison, I reached out to [facility] with [facility] to see if they can accept for [facility] RN, waiting on response         Patient Goals and CMS Choice      Discharge Placement                         Discharge Plan and Services Additional resources added to the After Visit Summary for                                       Social Determinants of Health (SDOH) Interventions SDOH Screenings   Food Insecurity: No Food Insecurity (04/01/2022)  Housing: Low Risk  (04/01/2022)  Transportation Needs: No Transportation Needs (04/01/2022)  Utilities: Not At Risk (04/01/2022)  Depression (PHQ2-9): Low Risk  (01/28/2022)  Financial Resource Strain: Low Risk  (01/28/2022)  Physical Activity: Inactive (01/28/2022)  Social Connections: Moderately Isolated (01/28/2022)  Stress: No Stress Concern Present (01/28/2022)  Tobacco Use: Low Risk  (04/01/2022)     Readmission Risk Interventions     No data to display

## 2022-04-04 NOTE — Progress Notes (Signed)
Physical Therapy Treatment Patient Details Name: Erik Martinez MRN: [id number] DOB: 05/08/1955 Today's Date: 04/04/2022   History of Present Illness Pt admitted for L TKR infection and is s/p antibiotic spacer. Pt is POD 1 at time of evaluation.    PT Comments    OOB and completes x 1 lap with RW PWB with ease.  Pt stated brace feels "OK" today.  Stated PA did check skin today so brace was not removed to check skin during session.     Recommendations for follow up therapy are one component of a multi-disciplinary discharge planning process, led by the attending physician.  Recommendations may be updated based on patient status, additional functional criteria and insurance authorization.  Follow Up Recommendations       Assistance Recommended at Discharge PRN  Patient can return home with the following A little help with walking and/or transfers;Help with stairs or ramp for entrance   Equipment Recommendations  Rolling walker (2 wheels)    Recommendations for Other Services       Precautions / Restrictions Precautions Precautions: Knee;Fall Precaution Booklet Issued: No Required Braces or Orthoses: Knee Immobilizer - Left Knee Immobilizer - Left: On at all times Restrictions Weight Bearing Restrictions: Yes LLE Weight Bearing: Partial weight bearing     Mobility  Bed Mobility Overal bed mobility: Independent               Patient Response: Cooperative  Transfers Overall transfer level: Independent Equipment used: Rolling walker (2 wheels)                    Ambulation/Gait Ambulation/Gait assistance: Supervision   Assistive device: Rolling walker (2 wheels) Gait Pattern/deviations: Step-to pattern Gait velocity: dec     General Gait Details: PWB with KI donned   Stairs             Wheelchair Mobility    Modified Rankin (Stroke Patients Only)       Balance Overall balance assessment: Modified Independent                                           Cognition Arousal/Alertness: Awake/alert Behavior During Therapy: WFL for tasks assessed/performed Overall Cognitive Status: Within Functional Limits for tasks assessed                                          Exercises      General Comments        Pertinent Vitals/Pain Pain Assessment Pain Assessment: Faces Faces Pain Scale: Hurts little more Pain Location: L leg Pain Descriptors / Indicators: Operative site guarding Pain Intervention(s): Limited activity within patient's tolerance, Monitored during session, Repositioned, Ice applied    Home Living                          Prior Function            PT Goals (current goals can now be found in the care plan section) Progress towards PT goals: Progressing toward goals    Frequency    BID      PT Plan      Co-evaluation              AM-PAC PT "6 Clicks" Mobility  Outcome Measure  Help needed turning from your back to your side while in a flat bed without using bedrails?: None Help needed moving from lying on your back to sitting on the side of a flat bed without using bedrails?: None Help needed moving to and from a bed to a chair (including a wheelchair)?: None Help needed standing up from a chair using your arms (e.g., wheelchair or bedside chair)?: None Help needed to walk in hospital room?: None Help needed climbing 3-5 steps with a railing? : A Little 6 Click Score: 23    End of Session Equipment Utilized During Treatment: Gait belt Activity Tolerance: Patient tolerated treatment well Patient left: in chair Nurse Communication: Mobility status PT Visit Diagnosis: Muscle weakness (generalized) (M62.81);Difficulty in walking, not elsewhere classified (R26.2);Pain Pain - Right/Left: Left Pain - part of body: Knee     Time: 0912-0925 PT Time Calculation (min) (ACUTE ONLY): 13 min  Charges:  $Gait Training: 8-22 mins                    Chesley Noon, PTA 04/04/22, 9:59 AM

## 2022-04-04 NOTE — Telephone Encounter (Signed)
Home Health verbal orders [place] Name: [place] number: [phone]  Requesting OT/PT/Skilled nursing/Social Work/Speech:   Reason: want to know if DR Diona Browner will follow orders that she will send over,but she did not tell me wheat the orders were. Just said that the patient will be getting discharged form hospital today.  Frequency:  Please forward to [facility] pool or providers CMA

## 2022-04-04 NOTE — Progress Notes (Signed)
Subjective: 3 Days Post-Op Procedure(s) (LRB): Removal of left total knee implants with insertion of spacer. (Left) Patient reports pain as mild to moderate.  PICC Line placed over the weekend. Patient is well, and has had no acute complaints or problems Plan is to go Home after hospital stay. Negative for chest pain and shortness of breath Fever: no Gastrointestinal: Negative for nausea and vomiting Patient has had a BM since surgery.  Objective: Vital signs in last 24 hours: Temp:  [97.6 F (36.4 C)-98.8 F (37.1 C)] 98.8 F (37.1 C) (03/25 0008) Pulse Rate:  [92-106] 106 (03/25 0008) Resp:  [16-20] 16 (03/25 0008) BP: (133-176)/(68-91) 176/91 (03/25 0008) SpO2:  [97 %-100 %] 98 % (03/25 0008)  Intake/Output from previous day:  Intake/Output Summary (Last 24 hours) at 04/04/2022 0737 Last data filed at 04/04/2022 0441 Gross per 24 hour  Intake 1258 ml  Output 3020 ml  Net -1762 ml    Intake/Output this shift: No intake/output data recorded.  Labs: Recent Labs    04/03/22 0430 04/04/22 0600  HGB 7.0* 8.4*   Recent Labs    04/03/22 0430  WBC 4.7  RBC 2.88*  HCT 22.6*  PLT 191   Recent Labs    04/03/22 0430  NA 139  K 4.4  CL 108  CO2 27  BUN 32*  CREATININE 1.64*  GLUCOSE 127*  CALCIUM 8.2*   No results for input(s): "LABPT", "INR" in the last 72 hours.   EXAM General - Patient is Alert and Oriented Extremity - Neurovascular intact Dorsiflexion/Plantar flexion intact No cellulitis present Compartment soft Dressing/Incision - clean, dry, with the wound VAC in place.  The knee immobilizer was locked in extension.  No erythema noted to the knee.  No purulent drainage.  Woundvac without any drainage noted. Motor Function - intact, moving foot and toes well on exam.  Patient ambulated 120 feet yesterday with PT. Abdomen soft with intact bowel sounds this morning.  Past Medical History:  Diagnosis Date   Anxiety    Arthritis    Avascular  necrosis of bones of both hips (HCC)    Bipolar disorder (HCC)    Bladder spasms    Chronic pain of left knee    Chronic pain syndrome    a.) followed by pain clinic   Cocaine use disorder, moderate, dependence ([place]) 09/09/2015   DDD (degenerative disc disease)    Depression    Erectile dysfunction    a.) on PDE5i (sildenafil)   Frequency-urgency syndrome    GERD (gastroesophageal reflux disease)    Hepatitis C 2018   History of hepatitis B    1983  TX'D--  NO ISSUES OR SYMPTOMS SINCE   Hyperlipidemia    Hypotestosteronemia    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   Weight as of this encounter: 83.9 kg. Advance diet Up with therapy  Acute blood loss anemia.  Postsurgical.  Hg 8.4 this morning s/p transfusion of 1 unit PRBC yesterday. Patient has had a BM since surgery. PICC Line in placed, knee ROM brace locked in extension. Plan for discharge home today pending final decision for IV Abx. Cultures without growth at this time.  Currently on IV Nafcillin. Switch to Prevena woundvac prior to discharge home. Follow-up with [facility] in 04/18/22.  DVT Prophylaxis - Lovenox, Foot Pumps, and TED hose Partial Weight-Bearing to left leg  J. Cameron Proud, PA-C [facility] Orthopaedic Surgery 04/04/2022, 7:37 AM

## 2022-04-04 NOTE — Progress Notes (Signed)
DISCHARGE NOTE:     Pt discharged with belongings and discharge instructions provided. Pt does not voice any concerns or questions at this time. Pt discharged with [facility] line in place and education provided. Medications returned to pt from pharmacy by nurse. Prevena also in place and switched over to portable carrier. Transportation provided via family friend.

## 2022-04-04 NOTE — TOC Progression Note (Signed)
Transition of Care [facility]) - Progression Note    Patient Details  Name: JAZAVION YOHO MRN: [id number] Date of Birth: 06-11-1955  Transition of Care [facility]) CM/SW [place], RN Phone Number: 04/04/2022, 1:33 PM  Clinical Narrative:     Damaris Schooner with Merleen Nicely at [facility] they will accept the patient for [facility] he paid for a rolling yesterday He lives alone but has support His friend will provide transport home, I called [employer] to check on the Rolling walker, the patient reports that he was billed yesterday for it but never got it,    Expected Discharge Plan: [place]    Expected Discharge Plan and Services   Discharge Planning Services: CM Consult     Expected Discharge Date: 04/04/22                         HH Arranged: RN [place] Agency: [facility] Date [place]: 04/04/22 Time [place]: [id number] Representative spoke with at [place]: [place] Determinants of Health (SDOH) Interventions SDOH Screenings   Food Insecurity: No Food Insecurity (04/01/2022)  Housing: Low Risk  (04/01/2022)  Transportation Needs: No Transportation Needs (04/01/2022)  Utilities: Not At Risk (04/01/2022)  Depression (PHQ2-9): Low Risk  (01/28/2022)  Financial Resource Strain: Low Risk  (01/28/2022)  Physical Activity: Inactive (01/28/2022)  Social Connections: Moderately Isolated (01/28/2022)  Stress: No Stress Concern Present (01/28/2022)  Tobacco Use: Low Risk  (04/04/2022)    Readmission Risk Interventions     No data to display

## 2022-04-04 NOTE — Plan of Care (Signed)
  Problem: Education: Goal: Ability to describe self-care measures that may prevent or decrease complications (Diabetes Survival Skills Education) will improve Outcome: Progressing   Problem: Coping: Goal: Ability to adjust to condition or change in health will improve Outcome: Progressing   Problem: Fluid Volume: Goal: Ability to maintain a balanced intake and output will improve Outcome: Progressing   Problem: Metabolic: Goal: Ability to maintain appropriate glucose levels will improve Outcome: Progressing   Problem: Nutritional: Goal: Maintenance of adequate nutrition will improve Outcome: Progressing   Problem: Skin Integrity: Goal: Risk for impaired skin integrity will decrease Outcome: Progressing   Problem: Tissue Perfusion: Goal: Adequacy of tissue perfusion will improve Outcome: Progressing

## 2022-04-04 NOTE — Care Management Important Message (Signed)
Important Message  Patient Details  Name: DIAGO CARIS MRN: [id number] Date of Birth: [birth year]   Medicare Important Message Given:  N/A - LOS <3 / Initial given by admissions     Dannette Barbara 04/04/2022, 9:01 AM

## 2022-04-04 NOTE — Consult Note (Signed)
NAME: Erik Martinez  DOB: 09/07/1955  MRN: [id number]  Date/Time: 04/04/2022 10:53 AM  REQUESTING PROVIDER: Dr.Hooten Subjective:  REASON FOR CONSULT: Left knee PJI- s/p explantation ? Erik Martinez is a 67 y.o. with a history of DM. 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 In Jan 2024 he was having more pain and swelling of the left knee and restarted  Keflex 1 gram PO Q6 and rifampin 300mg  PO BID . Dr.Hooten aspirated the knee on 01/27/22 and the culture was MSSA. He was referred to [facility] ortho by Dr.Hooten to discuss feasibility of 1 stage procedure. He saw Dr.Goel on 03/11/22 ESR done that day was 49, CRP 17, cr 1.1, hb 11.4. blood cultures x 2 from 03/16/22 when he had been to [facility] ED for pain and swelling left knee was negative [facility] team decided on 03/18/22 that 1 stage procedure was not possible and recommended 2 stage. So pateint underwent on 04/01/22 explantation of the hardware and placement of antibiotic spacer and beads Multiple cultures were sent and they are negative so far I am seeing patient for the management of the infection HE is feeling better No fever or chills Says he ambulated in his room. He live son his own Has done Iv cefazolin continuous infusion last time He takes oxycodone Been clean of other illegal substance for 6 yrs   Past Medical History:  Diagnosis Date   Anxiety    Arthritis    Avascular necrosis of bones of both hips (HCC)    Bipolar disorder (HCC)    Bladder spasms    Chronic pain  of left knee    Chronic pain syndrome    a.) followed by pain clinic   Cocaine use disorder, moderate, dependence ([place]) 09/09/2015   DDD (degenerative disc disease)    Depression    Erectile dysfunction    a.) on PDE5i (sildenafil)   Frequency-urgency syndrome    GERD (gastroesophageal reflux disease)    Hepatitis C 2018   History of hepatitis B    1983  TX'D--  NO ISSUES OR SYMPTOMS SINCE   Hyperlipidemia    Hypotestosteronemia    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   Neurogenic bladder    Nocturia    PAD (peripheral artery disease) (HCC)    Polysubstance abuse (HCC)    a.) cocaine + marijuana + BZO + opioids   PONV (postoperative nausea and vomiting)    "difficult to put me to sleep"   Spinal headache    T2DM (type 2 diabetes mellitus) ([place])    Urine incontinence     Past Surgical History:  Procedure Laterality Date   APPLICATION OF WOUND VAC Left 09/11/2021   Procedure: APPLICATION OF WOUND VAC;  Surgeon: [name], MD;  Location: [facility];  Service: Orthopedics;  Laterality: Left;  [id number]    BACK SURGERY     2 rods and 4 screws artificial disc   COLONOSCOPY  2008   INTERSTIM IMPLANT PLACEMENT  2007  INTERSTIM IMPLANT REVISION N/A 08/17/2012   Procedure: REPLACMENT OF IPG PLUS REPLACE LEAD OF INTERSTIM IMPLANT ;  Surgeon: [name], MD;  Location: [place];  Service: Urology;  Laterality: N/A;   IRRIGATION AND DEBRIDEMENT KNEE Left 09/11/2021   Procedure: IRRIGATION AND DEBRIDEMENT WITH POLY EXCHANGE LEFT KNEE;  Surgeon: [name], MD;  Location: [facility];  Service: Orthopedics;  Laterality: Left;   LOWER EXTREMITY ANGIOGRAPHY Left 11/17/2020   Procedure: LOWER EXTREMITY ANGIOGRAPHY;  Surgeon: Katha Cabal, MD;  Location: [place] [facility];  Service: Cardiovascular;  Laterality: Left;   LOWER EXTREMITY ANGIOGRAPHY Right 12/22/2020   Procedure: LOWER EXTREMITY ANGIOGRAPHY;  Surgeon: Katha Cabal, MD;  Location:  [place] [facility];  Service: Cardiovascular;  Laterality: Right;   LOWER EXTREMITY ANGIOGRAPHY Left 12/14/2021   Procedure: Lower Extremity Angiography;  Surgeon: Katha Cabal, MD;  Location: [place] [facility];  Service: Cardiovascular;  Laterality: Left;   LUMBAR DISC SURGERY  2005   L5   LUMBAR FUSION  X2  2006  &  2007   MANIPULATION KNEE JOINT Left 08/2002   OPEN DEBRIDEMENT LEFT TOTAL KNEE (SCAR, BONEY GRAOWTH)/ REMOVAL OLD SUTURES  05/01/2007   PARTIAL HIP ARTHROPLASTY Left 2001   x2   SHOULDER OPEN ROTATOR CUFF REPAIR Left 2000   TOTAL HIP ARTHROPLASTY Left 2002   TOTAL KNEE ARTHROPLASTY Left 06/2002   PARTIAL LEFT KNEE REPLACEMENT PRIOR TO THIS   TOTAL KNEE REVISION Left 2008   TOTAL KNEE REVISION Left 07/19/2021   Procedure: TOTAL KNEE REVISION;  Surgeon: [name], MD;  Location: [facility];  Service: Orthopedics;  Laterality: Left;   TOTAL KNEE REVISION Left 2005   TRIGGER FINGER RELEASE Bilateral    SEVERAL FINGERS   TRIGGER FINGER RELEASE Right 06/18/2014   Procedure: RELEASE TRIGGER FINGER/A-1 PULLEY;  Surgeon: [name], MD;  Location: [facility];  Service: Orthopedics;  Laterality: Right;    Social History   Socioeconomic History   Marital status: Divorced    Spouse name: Not on file   Number of children: 3   Years of education: Not on file   Highest education level: Some college, no degree  Occupational History   Not on file  Tobacco Use   Smoking status: Never   Smokeless tobacco: Never  Vaping Use   Vaping Use: Never used  Substance and Sexual Activity   Alcohol use: Not Currently    Alcohol/week: 0.0 - 2.0 standard drinks of alcohol   Drug use: Not Currently    Types: Cocaine    Comment: last + cocaine UDS 07/2016   Sexual activity: Not Currently  Other Topics Concern   Not on file  Social History Narrative    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, partime   Social Determinants of Health   Financial Resource Strain: [place]  (01/28/2022)    Overall Financial Resource Strain (CARDIA)    Difficulty of Paying Living Expenses: Not hard at all  Food Insecurity: No Food Insecurity (04/01/2022)   Hunger Vital Sign    Worried About Running Out of Food in the Last Year: Never true    [place] in the Last Year: Never true  Transportation Needs: No Transportation Needs (04/01/2022)   PRAPARE - Hydrologist (Medical): No    Lack of Transportation (Non-Medical): No  Physical Activity: Inactive (01/28/2022)   Exercise Vital Sign    Days of Exercise per  Week: 0 days    Minutes of Exercise per Session: 0 min  Stress: No Stress Concern Present (01/28/2022)   [place]    Feeling of Stress : Not at all  Social Connections: Moderately Isolated (01/28/2022)   Social Connection and Isolation Panel [NHANES]    Frequency of Communication with Friends and Family: More than three times a week    Frequency of Social Gatherings with Friends and Family: More than three times a week    Attends Religious Services: More than 4 times per year    Active Member of [employer] or Organizations: No    Attends Archivist Meetings: Never    Marital Status: Divorced  Human resources officer Violence: Not At Risk (04/01/2022)   Humiliation, Afraid, Rape, and Kick questionnaire    Fear of Current or Ex-Partner: No    Emotionally Abused: No    Physically Abused: No    Sexually Abused: No    Family History  Problem Relation Age of Onset   Diabetes Mother    Arthritis Mother    Cancer Mother        lung cancer   Hyperlipidemia Mother    Stroke Mother    Alcohol abuse Father    Cancer Father        lung   Arthritis Father    Diabetes Brother    Allergies  Allergen Reactions   Suboxone [Buprenorphine Hcl-Naloxone Hcl] Nausea And Vomiting   Tape Other (See Comments)    Whelps *Paper Tape is ok    Codeine Rash, Itching and Nausea Only   Morphine And  Related Itching and Rash   Other Rash    Telemetry electrodes   Silicone Rash    Whelps -*Paper Tape is ok    I? Current Facility-Administered Medications  Medication Dose Route Frequency Provider Last Rate Last Admin   0.9 %  sodium chloride infusion   Intravenous Continuous Hooten, Laurice Record, MD 100 mL/hr at 04/01/22 2255 New Bag at 04/01/22 2255   acetaminophen (TYLENOL) tablet 325-650 mg  325-650 mg Oral Q6H PRN Hooten, Laurice Record, MD       alum & mag hydroxide-simeth (MAALOX/MYLANTA) 200-200-20 MG/5ML suspension 30 mL  30 mL Oral Q4H PRN [name], MD   30 mL at 04/02/22 2147   atorvastatin (LIPITOR) tablet 20 mg  20 mg Oral QHS Hooten, Laurice Record, MD   20 mg at 04/03/22 2117   bisacodyl (DULCOLAX) suppository 10 mg  10 mg Rectal Daily PRN Hooten, Laurice Record, MD       celecoxib (CELEBREX) capsule 200 mg  200 mg Oral BID [name], MD   200 mg at 04/04/22 [id number]   Chlorhexidine Gluconate Cloth 2 % PADS 6 each  6 each Topical Daily Hooten, Laurice Record, MD   6 each at 04/04/22 0935   cyclobenzaprine (FLEXERIL) tablet 10 mg  10 mg Oral Q12H Hooten, Laurice Record, MD   10 mg at 04/04/22 0934   diphenhydrAMINE (BENADRYL) 12.5 MG/5ML elixir 12.5-25 mg  12.5-25 mg Oral Q4H PRN Hooten, Laurice Record, MD       enoxaparin (LOVENOX) injection 30 mg  30 mg Subcutaneous Q12H Hooten, Laurice Record, MD   30 mg at 04/04/22 0827   ferrous sulfate tablet 324 mg  324 mg Oral Q lunch [name], MD   324 mg at 04/03/22 1208   HYDROmorphone (DILAUDID) injection 0.5-1 mg  0.5-1 mg Intravenous Q4H PRN  [name], MD   1 mg at 04/03/22 [id number]   insulin aspart (novoLOG) injection 0-15 Units  0-15 Units Subcutaneous TID WC Hooten, Laurice Record, MD   2 Units at 04/04/22 0825   insulin aspart (novoLOG) injection 0-5 Units  0-5 Units Subcutaneous QHS Hooten, Laurice Record, MD       magnesium hydroxide (MILK OF MAGNESIA) suspension 30 mL  30 mL Oral Daily Hooten, Laurice Record, MD   30 mL at 04/04/22 0933   menthol-cetylpyridinium (CEPACOL) lozenge  3 mg  1 lozenge Oral PRN Hooten, Laurice Record, MD       Or   phenol (CHLORASEPTIC) mouth spray 1 spray  1 spray Mouth/Throat PRN Hooten, Laurice Record, MD       metFORMIN (GLUCOPHAGE) tablet 1,000 mg  1,000 mg Oral BID WC Hooten, Laurice Record, MD   1,000 mg at 04/04/22 [id number]   multivitamin with minerals tablet 1 tablet  1 tablet Oral Q lunch Hooten, Laurice Record, MD   1 tablet at 04/03/22 1208   nafcillin 2 g in sodium chloride 0.9 % 100 mL IVPB  2 g Intravenous Q4H Hooten, Laurice Record, MD 200 mL/hr at 04/04/22 0841 2 g at 04/04/22 0841   ondansetron (ZOFRAN) tablet 4 mg  4 mg Oral Q6H PRN Hooten, Laurice Record, MD       Or   ondansetron (ZOFRAN) injection 4 mg  4 mg Intravenous Q6H PRN Hooten, Laurice Record, MD       oxyCODONE (Oxy IR/ROXICODONE) immediate release tablet 10 mg  10 mg Oral Q4H PRN [name], MD   10 mg at 04/03/22 2123   oxyCODONE (Oxy IR/ROXICODONE) immediate release tablet 15 mg  15 mg Oral Q6H Hooten, Laurice Record, MD   15 mg at 04/04/22 [id number]   oxyCODONE (Oxy IR/ROXICODONE) immediate release tablet 5 mg  5 mg Oral Q4H PRN Hooten, Laurice Record, MD       pantoprazole (PROTONIX) EC tablet 40 mg  40 mg Oral BID [name], MD   40 mg at 04/04/22 0934   pregabalin (LYRICA) capsule 200 mg  200 mg Oral TID [name], MD   200 mg at 04/04/22 [id number]   senna-docusate (Senokot-S) tablet 1 tablet  1 tablet Oral BID [name], MD   1 tablet at 04/04/22 0934   sodium chloride flush (NS) 0.9 % injection 10-40 mL  10-40 mL Intracatheter Q12H Hooten, Laurice Record, MD   10 mL at 04/03/22 1056   sodium chloride flush (NS) 0.9 % injection 10-40 mL  10-40 mL Intracatheter PRN Hooten, Laurice Record, MD       sodium phosphate (FLEET) 7-19 GM/118ML enema 1 enema  1 enema Rectal Once PRN Hooten, Laurice Record, MD       terbinafine (LAMISIL) tablet 250 mg  250 mg Oral Daily Hooten, Laurice Record, MD   250 mg at 04/04/22 0934   tiZANidine (ZANAFLEX) tablet 4 mg  4 mg Oral TID PRN [name], MD   4 mg at 04/02/22 0321   traMADol (ULTRAM) tablet  50-100 mg  50-100 mg Oral Q4H PRN [name], MD   100 mg at 04/02/22 0321   tranexamic acid (CYKLOKAPRON) IVPB 1,000 mg  1,000 mg Intravenous Once Hooten, Laurice Record, MD       zolpidem (AMBIEN) tablet 10 mg  10 mg Oral QHS PRN [name], MD   10 mg at 04/03/22 2123     Abtx:  Anti-infectives (  From admission, onward)    Start     Dose/Rate Route Frequency Ordered Stop   04/04/22 0800  nafcillin 2 g in sodium chloride 0.9 % 100 mL IVPB        2 g 200 mL/hr over 30 Minutes Intravenous Every 4 hours 04/04/22 0506     04/02/22 1000  terbinafine (LAMISIL) tablet 250 mg        250 mg Oral Daily 04/01/22 2224     04/01/22 2200  nafcillin injection 2 g  Status:  Discontinued        2 g Intravenous Every 4 hours 04/01/22 2117 04/01/22 2138   04/01/22 2200  nafcillin 2 g in sodium chloride 0.9 % 100 mL IVPB  Status:  Discontinued        2 g 200 mL/hr over 30 Minutes Intravenous Every 4 hours 04/01/22 2138 04/04/22 0506   04/01/22 1221  tobramycin (NEBCIN) powder  Status:  Discontinued          As needed 04/01/22 1221 04/01/22 2041   04/01/22 1221  vancomycin (VANCOCIN) powder  Status:  Discontinued          As needed 04/01/22 1222 04/01/22 2041       REVIEW OF SYSTEMS:  Const: negative fever, negative chills, negative weight loss Eyes: negative diplopia or visual changes, negative eye pain ENT: negative coryza, negative sore throat Resp: negative cough, hemoptysis, dyspnea Cards: negative for chest pain, palpitations, lower extremity edema GU: as above GI: Negative for abdominal pain, diarrhea, bleeding, constipation Skin: negative for rash and pruritus Heme: negative for easy bruising and gum/nose bleeding MS: left knee pain Neurolo:negative for headaches, dizziness, vertigo, memory problems  Psych: anxiety, Endocrine:, diabetes Allergy/Immunology-as above, but to no antibiotics ?  Objective:  VITALS:  BP (!) 186/82 (BP Location: Left Arm)   Pulse 98   Temp 98.3 F  (36.8 C)   Resp 18   Ht 5\' 10"  (1.778 m)   Wt 83.9 kg   SpO2 99%   BMI 26.54 kg/m  LDA Rt PICC PHYSICAL EXAM:  General: Alert, cooperative, no distress, appears stated age.  Head: Normocephalic, without obvious abnormality, atraumatic. Eyes: Conjunctivae clear, anicteric sclerae. Pupils are equal ENT Nares normal. No drainage or sinus tenderness. Lips, mucosa, and tongue normal. No Thrush Neck: Supple, symmetrical, no adenopathy, thyroid: non tender no carotid bruit and no JVD. Back: No CVA tenderness. Lungs: Clear to auscultation bilaterally. No Wheezing or Rhonchi. No rales. Heart: Regular rate and rhythm, no murmur, rub or gallop. Abdomen: Soft, non-tender,not distended. Bowel sounds normal. No masses Extremities: left knee- in brace Feet healthy- DP felt Skin: No rashes or lesions. Or bruising Lymph: Cervical, supraclavicular normal. Neurologic: Grossly non-focal Pertinent Labs Lab Results CBC    Component Value Date/Time   WBC 4.5 04/04/2022 0744   RBC 3.42 (L) 04/04/2022 0744   HGB 8.5 (L) 04/04/2022 0744   HGB 13.4 05/23/2013 0329   HCT 26.8 (L) 04/04/2022 0744   HCT 38.5 (L) 05/23/2013 0329   PLT 208 04/04/2022 0744   PLT 153 05/23/2013 0329   MCV 78.4 (L) 04/04/2022 0744   MCV 86 05/23/2013 0329   MCH 24.9 (L) 04/04/2022 0744   MCHC 31.7 04/04/2022 0744   RDW 16.3 (H) 04/04/2022 0744   RDW 12.7 05/23/2013 0329   LYMPHSABS 0.9 04/04/2022 0744   LYMPHSABS 1.7 05/23/2013 0329   MONOABS 0.3 04/04/2022 0744   MONOABS 0.4 05/23/2013 0329   EOSABS 0.1 04/04/2022 0744   EOSABS  0.2 05/23/2013 0329   BASOSABS 0.0 04/04/2022 0744   BASOSABS 0.0 05/23/2013 0329       Latest Ref Rng & Units 04/04/2022    7:44 AM 04/03/2022    4:30 AM 03/28/2022    1:45 PM  CMP  Glucose 70 - 99 mg/dL 182  127  114   BUN 8 - 23 mg/dL 22  32  17   Creatinine 0.61 - 1.24 mg/dL 1.25  1.64  1.19   Sodium 135 - 145 mmol/L 139  139  138   Potassium 3.5 - 5.1 mmol/L 4.1  4.4  4.4    Chloride 98 - 111 mmol/L 99  108  101   CO2 22 - 32 mmol/L 27  27  25    Calcium 8.9 - 10.3 mg/dL 9.4  8.2  9.0   Total Protein 6.5 - 8.1 g/dL 7.4   8.1   Total Bilirubin 0.3 - 1.2 mg/dL 0.9   0.4   Alkaline Phos 38 - 126 U/L 71   87   AST 15 - 41 U/L 21   17   ALT 0 - 44 U/L 10   15       Microbiology: Recent Results (from the past 240 hour(s))  Surgical pcr screen     Status: None   Collection Time: 03/28/22  1:45 PM   Specimen: Nasal Mucosa; Nasal Swab  Result Value Ref Range Status   MRSA, PCR NEGATIVE NEGATIVE Final   Staphylococcus aureus NEGATIVE NEGATIVE Final    Comment: (NOTE) The Xpert SA Assay (FDA approved for NASAL specimens in patients 77 years of age and older), is one component of a comprehensive surveillance program. It is not intended to diagnose infection nor to guide or monitor treatment. Performed at [facility], [address] [place], [place] [address]   Aerobic/Anaerobic Culture w Gram Stain (surgical/deep wound)     Status: None (Preliminary result)   Collection Time: 04/01/22 11:49 AM   Specimen: PATH Other; Body Fluid  Result Value Ref Range Status   Specimen Description   Final    KNEE Performed at [facility], [address], [place], [place] [address]    Special Requests   Final    LEFT SUPRA [facility] Performed at [facility], [address] [place], [place] [address]    Gram Stain   Final    NO ORGANISMS SEEN FEW WBC PRESENT, PREDOMINANTLY PMN Performed at [facility], [address], [place], [place] [address]    Culture   Final    NO GROWTH 3 DAYS Performed at [facility], [place] [address], [place], [place] [address]    Report Status PENDING  Incomplete  Aerobic/Anaerobic Culture w Gram Stain (surgical/deep wound)     Status: None (Preliminary result)   Collection Time: 04/01/22 11:52 AM   Specimen: PATH Other; Body Fluid  Result Value Ref Range Status   Specimen  Description   Final    KNEE Performed at [facility], [address] [place], [place] [address]    Special Requests   Final    LEFT KNEE MEDIAL ASPECT Performed at [facility], [address] [place], [place] [address]    Gram Stain   Final    NO ORGANISMS SEEN FEW WBC PRESENT, PREDOMINANTLY PMN Performed at [facility], [address], [place],  [address]    Culture   Final    CULTURE REINCUBATED FOR BETTER GROWTH  Performed at [facility], [place] [address], [place], [place] [address]    Report Status PENDING  Incomplete  Aerobic/Anaerobic Culture w Gram Stain (surgical/deep wound)     Status: None (Preliminary result)   Collection Time: 04/01/22  4:24 PM   Specimen: PATH Other; Tissue  Result Value Ref Range Status   Specimen Description   Final    WOUND LEFT FEMORAL CANAL Performed at [facility], [place] [address] [place], [place] [address]    Special Requests   Final    NONE Performed at [facility], [address] [place], [place] [address]    Gram Stain   Final    NO ORGANISMS SEEN WBC SEEN RED BLOOD CELLS Performed at [facility], [address] [place], [place] [address]    Culture   Final    NO GROWTH 3 DAYS NO ANAEROBES ISOLATED; CULTURE IN PROGRESS FOR 5 DAYS Performed at [facility], [place] [address], [place], [place] [address]    Report Status PENDING  Incomplete    IMAGING RESULTS: Xray knee I have personally reviewed the films ? 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 DAIR in Sept 2023 and received 6 weeks of IV cefazolin + oral rifampin follwoed by PO JKEflex 1 gram 6h+ rifampin which he was non compliant , and aspiration on 01/27/22 of the left knee joint was positive for MSSA Underwent Stage 1 of the 2 stage-- procedure had explantation of the hardware on 04/01/22 Cultures sent- day 3 no growth  so far Will send him on MSSA antibiotic- either nafcillin or cefazolin IV depending on the cost and insurance copay If naficillin it will be 12 grams Q 24 Cefazolin it will be 6 grams IV q 24 As no hardware present rifampin is not required Cr is 1.25- ( baseline < 1) ESR on 3/6 > 120 CRP 80 Will monitor weekly labs Avoid celebrex, other NSAIDs and Dc terbinafine   DM on metformin, empagliflozin ozempic  HLD- lipitor ? ?AKI- likely prerenal- improving   Anemia- multifactorial  blood loss, chronic infection ___________________________________________________ Discussed with patient in detail, requesting provider and pharmacist Will follow him as OP OPAT placed  Note:  This document was prepared using Dragon voice recognition software and may include unintentional dictation errors.

## 2022-04-04 NOTE — Progress Notes (Signed)
PHARMACY CONSULT NOTE FOR:  OUTPATIENT  PARENTERAL ANTIBIOTIC THERAPY (OPAT)  Indication: prosthetic joint infection of left knee Regimen: Nafcillin 12gm IV daily as continuous infusion over 24 hours.   End date: 05/13/2022  Labs - Once weekly:  CBC/D, CMP, ESR and CRP Please pull PIC at completion of IV antibiotics Fax weekly lab results  promptly to [phone]  IV antibiotic discharge orders are pended. To discharging provider:  please sign these orders via discharge navigator,  Select New Orders & click on the button choice - Manage This Unsigned Work.     Thank you for allowing pharmacy to be a part of this patient's care.  Doreene Eland, PharmD, BCPS, BCIDP Work Cell: [phone] 04/04/2022 11:06 AM

## 2022-04-04 NOTE — Treatment Plan (Addendum)
Diagnosis: Prosthetic Joint infection - left Knee Due to Staph aureus S/p explantation of hardware on 04/01/22 Baseline Creatinine 1.25    Allergies  Allergen Reactions   Suboxone [Buprenorphine Hcl-Naloxone Hcl] Nausea And Vomiting   Tape Other (See Comments)    Whelps *Paper Tape is ok    Codeine Rash, Itching and Nausea Only   Morphine And Related Itching and Rash   Other Rash    Telemetry electrodes   Silicone Rash    Whelps -*Paper Tape is ok     OPAT Orders Discharge antibiotics: Nafcillin as continuous infusion 12 grams every 24 hours Duration: 6 weeks End Date: 05/13/22  [facility] Care Per Protocol:  Labs weekly while on IV antibiotics: _X_ CBC with differential  _X_ CMP _X_ CRP _X_ ESR   X__ Please pull PIC at completion of IV antibiotics   Fax weekly lab results  promptly to [phone]  Clinic Follow Up Appt: 04/28/22 at 10.30AM   Call [phone] with any concerns or questions

## 2022-04-05 ENCOUNTER — Telehealth: Payer: Self-pay

## 2022-04-05 NOTE — Telephone Encounter (Signed)
Returned [employer] call in regards to orders. Merleen Nicely states the orthopedist will be taking care of Mr. [facility] orders so nothing further is needed from Dr. Diona Browner.

## 2022-04-05 NOTE — Telephone Encounter (Signed)
Okay to provide verbal orders as requested.  

## 2022-04-05 NOTE — Transitions of Care (Post Inpatient/ED Visit) (Signed)
   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 How have you been since you were released from the hospital?: Better Any questions or concerns?: No  Items Reviewed: Did you receive and understand the discharge instructions provided?: Yes Medications obtained and verified?: Yes (Medications Reviewed) Any new allergies since your discharge?: No Dietary orders reviewed?: Yes Do you have support at home?: Yes  Home Care and Equipment/Supplies: [place] Ordered?: Yes Name of [place]:: [place] Has Agency set up a time to come to your home?: No EMR reviewed for [place] Orders: Orders present/patient has not received call (refer to CM for follow-up) Any new equipment or medical supplies ordered?: Yes Name of Medical supply agency?: [facility]- pt did refused [facility]- hospital Were you able to get the equipment/medical supplies?: Yes Do you have any questions related to the use of the equipment/supplies?: No  Functional Questionnaire: Do you need assistance with bathing/showering or dressing?: No Do you need assistance with meal preparation?: Yes Do you need assistance with eating?: No Do you have difficulty maintaining continence: No Do you need assistance with getting out of bed/getting out of a chair/moving?: No Do you have difficulty managing or taking your medications?: No  Follow up appointments reviewed: PCP Follow-up appointment confirmed?: Yes MD Provider Line [phone] Given: Yes Date of PCP follow-up  appointment?: 04/19/22 Follow-up Provider: [facility] Follow-up appointment confirmed?: Yes Date of Specialist follow-up appointment?: 04/18/22 Follow-Up Specialty Provider:: Vance Peper PA- and Dr Marry Guan 05-12-22 Do you need transportation to your follow-up appointment?: No Do you understand care options if your condition(s) worsen?: Yes-patient verbalized understanding    [place] LPN [place] Direct Dial [phone]

## 2022-04-05 NOTE — Telephone Encounter (Signed)
Home Health verbal orders [place] Name: [place] number: [id number]  Requesting OT/PT/Skilled nursing/Social Work/Speech: nursing and PT  Reason:IV INFUSION  Frequency:   Please forward to [facility] pool or providers CMA

## 2022-04-06 LAB — AEROBIC/ANAEROBIC CULTURE W GRAM STAIN (SURGICAL/DEEP WOUND)
Culture: NO GROWTH
Gram Stain: NONE SEEN

## 2022-04-07 DIAGNOSIS — E1142 Type 2 diabetes mellitus with diabetic polyneuropathy: Secondary | ICD-10-CM | POA: Diagnosis not present

## 2022-04-07 DIAGNOSIS — E785 Hyperlipidemia, unspecified: Secondary | ICD-10-CM | POA: Diagnosis not present

## 2022-04-07 DIAGNOSIS — M5136 Other intervertebral disc degeneration, lumbar region: Secondary | ICD-10-CM | POA: Diagnosis not present

## 2022-04-07 DIAGNOSIS — M87051 Idiopathic aseptic necrosis of right femur: Secondary | ICD-10-CM | POA: Diagnosis not present

## 2022-04-07 DIAGNOSIS — F4310 Post-traumatic stress disorder, unspecified: Secondary | ICD-10-CM | POA: Diagnosis not present

## 2022-04-07 DIAGNOSIS — N529 Male erectile dysfunction, unspecified: Secondary | ICD-10-CM | POA: Diagnosis not present

## 2022-04-07 DIAGNOSIS — F5104 Psychophysiologic insomnia: Secondary | ICD-10-CM | POA: Diagnosis not present

## 2022-04-07 DIAGNOSIS — M87052 Idiopathic aseptic necrosis of left femur: Secondary | ICD-10-CM | POA: Diagnosis not present

## 2022-04-07 DIAGNOSIS — D62 Acute posthemorrhagic anemia: Secondary | ICD-10-CM | POA: Diagnosis not present

## 2022-04-07 DIAGNOSIS — E291 Testicular hypofunction: Secondary | ICD-10-CM | POA: Diagnosis not present

## 2022-04-07 DIAGNOSIS — E1151 Type 2 diabetes mellitus with diabetic peripheral angiopathy without gangrene: Secondary | ICD-10-CM | POA: Diagnosis not present

## 2022-04-07 DIAGNOSIS — K219 Gastro-esophageal reflux disease without esophagitis: Secondary | ICD-10-CM | POA: Diagnosis not present

## 2022-04-07 DIAGNOSIS — F1910 Other psychoactive substance abuse, uncomplicated: Secondary | ICD-10-CM | POA: Diagnosis not present

## 2022-04-07 DIAGNOSIS — Z4789 Encounter for other orthopedic aftercare: Secondary | ICD-10-CM | POA: Diagnosis not present

## 2022-04-07 DIAGNOSIS — N319 Neuromuscular dysfunction of bladder, unspecified: Secondary | ICD-10-CM | POA: Diagnosis not present

## 2022-04-07 DIAGNOSIS — G894 Chronic pain syndrome: Secondary | ICD-10-CM | POA: Diagnosis not present

## 2022-04-07 DIAGNOSIS — T8454XD Infection and inflammatory reaction due to internal left knee prosthesis, subsequent encounter: Secondary | ICD-10-CM | POA: Diagnosis not present

## 2022-04-07 DIAGNOSIS — M25562 Pain in left knee: Secondary | ICD-10-CM | POA: Diagnosis not present

## 2022-04-07 DIAGNOSIS — F411 Generalized anxiety disorder: Secondary | ICD-10-CM | POA: Diagnosis not present

## 2022-04-07 DIAGNOSIS — F1420 Cocaine dependence, uncomplicated: Secondary | ICD-10-CM | POA: Diagnosis not present

## 2022-04-07 DIAGNOSIS — I70219 Atherosclerosis of native arteries of extremities with intermittent claudication, unspecified extremity: Secondary | ICD-10-CM | POA: Diagnosis not present

## 2022-04-07 DIAGNOSIS — M47812 Spondylosis without myelopathy or radiculopathy, cervical region: Secondary | ICD-10-CM | POA: Diagnosis not present

## 2022-04-07 DIAGNOSIS — F11159 Opioid abuse with opioid-induced psychotic disorder, unspecified: Secondary | ICD-10-CM | POA: Diagnosis not present

## 2022-04-07 DIAGNOSIS — E1169 Type 2 diabetes mellitus with other specified complication: Secondary | ICD-10-CM | POA: Diagnosis not present

## 2022-04-07 DIAGNOSIS — F319 Bipolar disorder, unspecified: Secondary | ICD-10-CM | POA: Diagnosis not present

## 2022-04-07 LAB — AEROBIC/ANAEROBIC CULTURE W GRAM STAIN (SURGICAL/DEEP WOUND): Gram Stain: NONE SEEN

## 2022-04-09 LAB — AEROBIC/ANAEROBIC CULTURE W GRAM STAIN (SURGICAL/DEEP WOUND): Gram Stain: NONE SEEN

## 2022-04-11 ENCOUNTER — Encounter (INDEPENDENT_AMBULATORY_CARE_PROVIDER_SITE_OTHER): Payer: PPO

## 2022-04-11 ENCOUNTER — Ambulatory Visit (INDEPENDENT_AMBULATORY_CARE_PROVIDER_SITE_OTHER): Payer: PPO | Admitting: Vascular Surgery

## 2022-04-12 ENCOUNTER — Other Ambulatory Visit (INDEPENDENT_AMBULATORY_CARE_PROVIDER_SITE_OTHER): Payer: Self-pay | Admitting: Nurse Practitioner

## 2022-04-12 DIAGNOSIS — I739 Peripheral vascular disease, unspecified: Secondary | ICD-10-CM

## 2022-04-13 DIAGNOSIS — M25562 Pain in left knee: Secondary | ICD-10-CM | POA: Diagnosis not present

## 2022-04-13 DIAGNOSIS — E1142 Type 2 diabetes mellitus with diabetic polyneuropathy: Secondary | ICD-10-CM | POA: Diagnosis not present

## 2022-04-13 DIAGNOSIS — N319 Neuromuscular dysfunction of bladder, unspecified: Secondary | ICD-10-CM | POA: Diagnosis not present

## 2022-04-13 DIAGNOSIS — M87052 Idiopathic aseptic necrosis of left femur: Secondary | ICD-10-CM | POA: Diagnosis not present

## 2022-04-13 DIAGNOSIS — N529 Male erectile dysfunction, unspecified: Secondary | ICD-10-CM | POA: Diagnosis not present

## 2022-04-13 DIAGNOSIS — Z8619 Personal history of other infectious and parasitic diseases: Secondary | ICD-10-CM | POA: Diagnosis not present

## 2022-04-13 DIAGNOSIS — M5136 Other intervertebral disc degeneration, lumbar region: Secondary | ICD-10-CM | POA: Diagnosis not present

## 2022-04-13 DIAGNOSIS — E1169 Type 2 diabetes mellitus with other specified complication: Secondary | ICD-10-CM | POA: Diagnosis not present

## 2022-04-13 DIAGNOSIS — T8454XD Infection and inflammatory reaction due to internal left knee prosthesis, subsequent encounter: Secondary | ICD-10-CM | POA: Diagnosis not present

## 2022-04-13 DIAGNOSIS — M47812 Spondylosis without myelopathy or radiculopathy, cervical region: Secondary | ICD-10-CM | POA: Diagnosis not present

## 2022-04-13 DIAGNOSIS — G894 Chronic pain syndrome: Secondary | ICD-10-CM | POA: Diagnosis not present

## 2022-04-13 DIAGNOSIS — E785 Hyperlipidemia, unspecified: Secondary | ICD-10-CM | POA: Diagnosis not present

## 2022-04-13 DIAGNOSIS — D62 Acute posthemorrhagic anemia: Secondary | ICD-10-CM | POA: Diagnosis not present

## 2022-04-13 DIAGNOSIS — F1910 Other psychoactive substance abuse, uncomplicated: Secondary | ICD-10-CM | POA: Diagnosis not present

## 2022-04-13 DIAGNOSIS — F319 Bipolar disorder, unspecified: Secondary | ICD-10-CM | POA: Diagnosis not present

## 2022-04-13 DIAGNOSIS — F5104 Psychophysiologic insomnia: Secondary | ICD-10-CM | POA: Diagnosis not present

## 2022-04-13 DIAGNOSIS — F411 Generalized anxiety disorder: Secondary | ICD-10-CM | POA: Diagnosis not present

## 2022-04-13 DIAGNOSIS — K219 Gastro-esophageal reflux disease without esophagitis: Secondary | ICD-10-CM | POA: Diagnosis not present

## 2022-04-13 DIAGNOSIS — E1151 Type 2 diabetes mellitus with diabetic peripheral angiopathy without gangrene: Secondary | ICD-10-CM | POA: Diagnosis not present

## 2022-04-13 DIAGNOSIS — M87051 Idiopathic aseptic necrosis of right femur: Secondary | ICD-10-CM | POA: Diagnosis not present

## 2022-04-13 DIAGNOSIS — F4310 Post-traumatic stress disorder, unspecified: Secondary | ICD-10-CM | POA: Diagnosis not present

## 2022-04-13 DIAGNOSIS — Z9889 Other specified postprocedural states: Secondary | ICD-10-CM | POA: Diagnosis not present

## 2022-04-13 DIAGNOSIS — F1420 Cocaine dependence, uncomplicated: Secondary | ICD-10-CM | POA: Diagnosis not present

## 2022-04-13 DIAGNOSIS — I70219 Atherosclerosis of native arteries of extremities with intermittent claudication, unspecified extremity: Secondary | ICD-10-CM | POA: Diagnosis not present

## 2022-04-13 DIAGNOSIS — E291 Testicular hypofunction: Secondary | ICD-10-CM | POA: Diagnosis not present

## 2022-04-13 DIAGNOSIS — F11159 Opioid abuse with opioid-induced psychotic disorder, unspecified: Secondary | ICD-10-CM | POA: Diagnosis not present

## 2022-04-13 DIAGNOSIS — M009 Pyogenic arthritis, unspecified: Secondary | ICD-10-CM | POA: Diagnosis not present

## 2022-04-14 ENCOUNTER — Telehealth: Payer: Self-pay | Admitting: Family Medicine

## 2022-04-14 NOTE — Telephone Encounter (Signed)
Prescription Request  04/14/2022  LOV: 10/15/2021  What is the name of the medication or equipment? zolpidem (AMBIEN) 10 MG tablet   Have you contacted your pharmacy to request a refill? No   Which pharmacy would you like this sent to?  [id number] - [place], [place] - [address] [address] Phone: [phone] Fax: [phone]    Patient notified that their request is being sent to the clinical staff for review and that they should receive a response within 2 business days.   Please advise at Mobile [phone] (mobile)

## 2022-04-14 NOTE — Telephone Encounter (Signed)
Spoke with Erik Martinez and advised he has available refills at [facility].  Patient will call [facility] for refill.

## 2022-04-15 ENCOUNTER — Emergency Department
Admission: EM | Admit: 2022-04-15 | Discharge: 2022-04-15 | Disposition: A | Discharge status: Home / Self Care | Payer: PPO | Attending: Emergency Medicine | Admitting: Emergency Medicine

## 2022-04-15 DIAGNOSIS — M25562 Pain in left knee: Secondary | ICD-10-CM | POA: Diagnosis not present

## 2022-04-15 DIAGNOSIS — M47812 Spondylosis without myelopathy or radiculopathy, cervical region: Secondary | ICD-10-CM | POA: Diagnosis not present

## 2022-04-15 DIAGNOSIS — F1910 Other psychoactive substance abuse, uncomplicated: Secondary | ICD-10-CM | POA: Diagnosis not present

## 2022-04-15 DIAGNOSIS — T82898A Other specified complication of vascular prosthetic devices, implants and grafts, initial encounter: Secondary | ICD-10-CM | POA: Diagnosis not present

## 2022-04-15 DIAGNOSIS — Z452 Encounter for adjustment and management of vascular access device: Secondary | ICD-10-CM | POA: Insufficient documentation

## 2022-04-15 DIAGNOSIS — M5136 Other intervertebral disc degeneration, lumbar region: Secondary | ICD-10-CM | POA: Diagnosis not present

## 2022-04-15 DIAGNOSIS — M87052 Idiopathic aseptic necrosis of left femur: Secondary | ICD-10-CM | POA: Diagnosis not present

## 2022-04-15 DIAGNOSIS — Z95828 Presence of other vascular implants and grafts: Secondary | ICD-10-CM

## 2022-04-15 DIAGNOSIS — M87051 Idiopathic aseptic necrosis of right femur: Secondary | ICD-10-CM | POA: Diagnosis not present

## 2022-04-15 DIAGNOSIS — F1420 Cocaine dependence, uncomplicated: Secondary | ICD-10-CM | POA: Diagnosis not present

## 2022-04-15 DIAGNOSIS — D62 Acute posthemorrhagic anemia: Secondary | ICD-10-CM | POA: Diagnosis not present

## 2022-04-15 DIAGNOSIS — E1151 Type 2 diabetes mellitus with diabetic peripheral angiopathy without gangrene: Secondary | ICD-10-CM | POA: Diagnosis not present

## 2022-04-15 DIAGNOSIS — T8454XD Infection and inflammatory reaction due to internal left knee prosthesis, subsequent encounter: Secondary | ICD-10-CM | POA: Diagnosis not present

## 2022-04-15 DIAGNOSIS — I70219 Atherosclerosis of native arteries of extremities with intermittent claudication, unspecified extremity: Secondary | ICD-10-CM | POA: Diagnosis not present

## 2022-04-15 DIAGNOSIS — F11159 Opioid abuse with opioid-induced psychotic disorder, unspecified: Secondary | ICD-10-CM | POA: Diagnosis not present

## 2022-04-15 DIAGNOSIS — E1142 Type 2 diabetes mellitus with diabetic polyneuropathy: Secondary | ICD-10-CM | POA: Diagnosis not present

## 2022-04-15 DIAGNOSIS — K219 Gastro-esophageal reflux disease without esophagitis: Secondary | ICD-10-CM | POA: Diagnosis not present

## 2022-04-15 DIAGNOSIS — E785 Hyperlipidemia, unspecified: Secondary | ICD-10-CM | POA: Diagnosis not present

## 2022-04-15 DIAGNOSIS — F319 Bipolar disorder, unspecified: Secondary | ICD-10-CM | POA: Diagnosis not present

## 2022-04-15 DIAGNOSIS — N529 Male erectile dysfunction, unspecified: Secondary | ICD-10-CM | POA: Diagnosis not present

## 2022-04-15 DIAGNOSIS — F4310 Post-traumatic stress disorder, unspecified: Secondary | ICD-10-CM | POA: Diagnosis not present

## 2022-04-15 DIAGNOSIS — E291 Testicular hypofunction: Secondary | ICD-10-CM | POA: Diagnosis not present

## 2022-04-15 DIAGNOSIS — F411 Generalized anxiety disorder: Secondary | ICD-10-CM | POA: Diagnosis not present

## 2022-04-15 DIAGNOSIS — F5104 Psychophysiologic insomnia: Secondary | ICD-10-CM | POA: Diagnosis not present

## 2022-04-15 DIAGNOSIS — G894 Chronic pain syndrome: Secondary | ICD-10-CM | POA: Diagnosis not present

## 2022-04-15 DIAGNOSIS — E1169 Type 2 diabetes mellitus with other specified complication: Secondary | ICD-10-CM | POA: Diagnosis not present

## 2022-04-15 DIAGNOSIS — N319 Neuromuscular dysfunction of bladder, unspecified: Secondary | ICD-10-CM | POA: Diagnosis not present

## 2022-04-15 NOTE — ED Triage Notes (Signed)
Pt states he had PICC line placed 14 days ago and noticed today that he had some drainage under the dressing. Pt states he has a 24 hours infusion going through picc line. Pt denies pain to the area. Pt denies fever or feeling ill.

## 2022-04-15 NOTE — ED Notes (Signed)
PICC line dressing changed using sterile technique. No inflammation/drainage/swelling noted on removing old dressing

## 2022-04-15 NOTE — ED Provider Notes (Signed)
[facility] Provider Note    Event Date/Time   First MD Initiated Contact with Patient 04/15/22 2027     (approximate)   History   Chief Complaint Vascular Access Problem   HPI  Erik Martinez is a 67 y.o. male with past medical history of infection of total knee arthroplasty who presents to the ED complaining of PICC line problem.  Patient reports that earlier today he noticed some leaking around his right upper extremity PICC line site.  He states that it seems to be leaking clear fluid, which he thinks could be his antibiotic.  He has not noticed any redness, swelling, or pain at the site.  He thinks it may have rubbed up against his crutches, causing the leak.  His states his knee has been healing well with no worsening in pain, swelling, or fever.     Physical Exam   Triage Vital Signs: ED Triage Vitals  Enc Vitals Group     BP 04/15/22 1849 (!) 163/72     Pulse Rate 04/15/22 1849 (!) 109     Resp 04/15/22 1849 18     Temp 04/15/22 1849 98.2 F (36.8 C)     Temp Source 04/15/22 1849 Oral     SpO2 04/15/22 1849 98 %     Weight 04/15/22 1902 180 lb (81.6 kg)     Height 04/15/22 1902 5\' 9"  (1.753 m)     Head Circumference --      Peak Flow --      Pain Score 04/15/22 1902 0     Pain Loc --      Pain Edu? --      Excl. in GC? --     Most recent vital signs: Vitals:   04/15/22 1849  BP: (!) 163/72  Pulse: (!) 109  Resp: 18  Temp: 98.2 F (36.8 C)  SpO2: 98%    Constitutional: Alert and oriented. Eyes: Conjunctivae are normal. Head: Atraumatic. Nose: No congestion/rhinnorhea. Mouth/Throat: Mucous membranes are moist.  Cardiovascular: Normal rate, regular rhythm. Grossly normal heart sounds.  2+ radial pulses bilaterally. Respiratory: Normal respiratory effort.  No retractions. Lungs CTAB. Gastrointestinal: Soft and nontender. No distention. Musculoskeletal: Left lower extremity with wound VAC and immobilizer in place to left knee.   Right upper extremity PICC line site with small amount of clear drainage underneath the dressing, no associated erythema, edema, warmth, or tenderness. Neurologic:  Normal speech and language. No gross focal neurologic deficits are appreciated.    ED Results / Procedures / Treatments   Labs (all labs ordered are listed, but only abnormal results are displayed) Labs Reviewed - No data to display   PROCEDURES:  Critical Care performed: No  Procedures   MEDICATIONS ORDERED IN ED: Medications - No data to display   IMPRESSION / MDM / ASSESSMENT AND PLAN / ED COURSE  I reviewed the triage vital signs and the nursing notes.                              67 y.o. male with past medical history of infection of total knee arthroplasty, currently with PICC line and nafcillin infusion who presents to the ED complaining of small amount of clear drainage from his PICC line site since earlier today.  Patient's presentation is most consistent with acute, uncomplicated illness.  Differential diagnosis includes, but is not limited to, PICC line disconnection, displaced PICC line, cellulitis.  Patient well-appearing and in no acute distress, vital signs are unremarkable.  He has no signs of infection to his PICC line site, does have a small amount of clear drainage underneath the dressing.  His knee appears to be healing well with no signs of worsening infection.  We will replace dressing to his PICC line site and assess for any break in the connection.  No ongoing drainage from PICC site with replacement of dressing and there are no signs of infection whatsoever.  Antibiotic continues to infuse without difficulty and patient is appropriate for discharge home with orthopedic and infectious disease follow-up.  He was counseled to return to the ED for new or worsening symptoms, patient agrees with plan.      FINAL CLINICAL IMPRESSION(S) / ED DIAGNOSES   Final diagnoses:  S/P PICC central line  placement     Rx / DC Orders   ED Discharge Orders     None        Note:  This document was prepared using Dragon voice recognition software and may include unintentional dictation errors.   Chesley NoonJessup, Reginal Wojcicki, MD 04/15/22 2228

## 2022-04-17 ENCOUNTER — Encounter: Payer: Self-pay | Admitting: Infectious Diseases

## 2022-04-18 ENCOUNTER — Encounter: Payer: Self-pay | Admitting: Orthopedic Surgery

## 2022-04-18 ENCOUNTER — Ambulatory Visit
Admission: RE | Admit: 2022-04-18 | Discharge: 2022-04-18 | Disposition: A | Discharge status: Home / Self Care | Payer: Self-pay | Source: Ambulatory Visit | Attending: Infectious Diseases | Admitting: Infectious Diseases

## 2022-04-18 ENCOUNTER — Ambulatory Visit
Admission: RE | Admit: 2022-04-18 | Discharge: 2022-04-18 | Disposition: A | Discharge status: Home / Self Care | Payer: PPO | Source: Ambulatory Visit | Attending: Infectious Diseases | Admitting: Infectious Diseases

## 2022-04-18 VITALS — BP 133/77 | HR 92 | Temp 98.0°F | Resp 18

## 2022-04-18 DIAGNOSIS — E1151 Type 2 diabetes mellitus with diabetic peripheral angiopathy without gangrene: Secondary | ICD-10-CM | POA: Diagnosis not present

## 2022-04-18 DIAGNOSIS — T8454XD Infection and inflammatory reaction due to internal left knee prosthesis, subsequent encounter: Secondary | ICD-10-CM | POA: Diagnosis not present

## 2022-04-18 DIAGNOSIS — Z96659 Presence of unspecified artificial knee joint: Secondary | ICD-10-CM | POA: Insufficient documentation

## 2022-04-18 DIAGNOSIS — G894 Chronic pain syndrome: Secondary | ICD-10-CM | POA: Diagnosis not present

## 2022-04-18 DIAGNOSIS — M25562 Pain in left knee: Secondary | ICD-10-CM | POA: Diagnosis not present

## 2022-04-18 DIAGNOSIS — E1142 Type 2 diabetes mellitus with diabetic polyneuropathy: Secondary | ICD-10-CM | POA: Diagnosis not present

## 2022-04-18 DIAGNOSIS — D62 Acute posthemorrhagic anemia: Secondary | ICD-10-CM | POA: Diagnosis not present

## 2022-04-18 DIAGNOSIS — I70219 Atherosclerosis of native arteries of extremities with intermittent claudication, unspecified extremity: Secondary | ICD-10-CM | POA: Diagnosis not present

## 2022-04-18 DIAGNOSIS — Z452 Encounter for adjustment and management of vascular access device: Secondary | ICD-10-CM | POA: Insufficient documentation

## 2022-04-18 NOTE — Progress Notes (Signed)
Patient to room 24 in same day surgery for PICC assessment by IV team RN Shelly.  Patient in no acute distress;

## 2022-04-18 NOTE — Progress Notes (Addendum)
VAST consulted to assess "leaking" PICC. Once at bedside asked patient to describe issue. Patient reported he came to the ED on Friday because his PICC was leaking yellow fluid beneath his dressing and his antibiotic is yellow.   The dressing was changed and he was discharged home. Over the weekend, his PICC dressing continued to become saturated with yellow fluid. He also reported his skin is very irritated and itchy beneath dressing. He verbalized that he might have damaged his PICC due to the use of crutches. He came to same day surgery for PICC assessment.  Upon visualization, PICC dressing and Biopatch saturated with yellow fluid although dressing remained intact due to multiple pieces of transpore tape around edges of dressing. Paused antibiotic infusion by clamping line. VAST RN removed old dressing and cleaned site well with Chlorhexadine. Noted irritation and bumpy rash to skin. Attempted to flush PICC with NS but met resistance and unable to obtain blood return. Pulled PICC back to 2cm mark; blood return obtained and easy to flush. Flushed line using "power flush method" with NS and no leaking noted. Applied SecurePort IV to insertion site prior to Biopatch application and new dressing. Flushed line with another NS without any leaking noted. Restarted antibiotic infusion. PICC RN notified of assessment. Patient instructed to call same day surgery immediately if leaking noted, and to request appointment for PICC line replacement. Same day surgery nurse, Arline Asp notified as well.

## 2022-04-19 ENCOUNTER — Telehealth: Payer: Self-pay

## 2022-04-19 ENCOUNTER — Ambulatory Visit
Admission: RE | Admit: 2022-04-19 | Discharge: 2022-04-19 | Disposition: A | Discharge status: Home / Self Care | Payer: Self-pay | Source: Ambulatory Visit | Attending: Infectious Diseases | Admitting: Infectious Diseases

## 2022-04-19 ENCOUNTER — Ambulatory Visit: Payer: PPO | Admitting: Family Medicine

## 2022-04-19 ENCOUNTER — Ambulatory Visit
Admission: RE | Admit: 2022-04-19 | Discharge: 2022-04-19 | Disposition: A | Discharge status: Home / Self Care | Payer: PPO | Source: Ambulatory Visit | Attending: Infectious Diseases | Admitting: Infectious Diseases

## 2022-04-19 VITALS — BP 155/83 | HR 88 | Temp 97.6°F | Resp 17

## 2022-04-19 DIAGNOSIS — Z96652 Presence of left artificial knee joint: Secondary | ICD-10-CM | POA: Diagnosis not present

## 2022-04-19 NOTE — Progress Notes (Signed)
Peripherally Inserted Central Catheter Placement  The IV Nurse has discussed with the patient and/or persons authorized to consent for the patient, the purpose of this procedure and the potential benefits and risks involved with this procedure.  The benefits include less needle sticks, lab draws from the catheter, and the patient may be discharged home with the catheter. Risks include, but not limited to, infection, bleeding, blood clot (thrombus formation), and puncture of an artery; nerve damage and irregular heartbeat and possibility to perform a PICC exchange if needed/ordered by physician.  Alternatives to this procedure were also discussed.  Bard Power PICC patient education guide, fact sheet on infection prevention and patient information card has been provided to patient /or left at bedside.    PICC Placement Documentation  PICC Single Lumen 04/19/22 Left Brachial 41 cm 0 cm (Active)  Indication for Insertion or Continuance of Line Home intravenous therapies (PICC only) 04/19/22 1400  Exposed Catheter (cm) 0 cm 04/19/22 1400  Site Assessment Clean, Dry, Intact 04/19/22 1400  Line Status Flushed;Saline locked;Blood return noted 04/19/22 1400  Dressing Type Transparent;Securing device 04/19/22 1400  Dressing Status Allergy to antimicrobial disc 04/19/22 1400  Safety Lock Not Applicable 04/19/22 1400  Line Care Connections checked and tightened 04/19/22 1400  Dressing Intervention New dressing 04/19/22 1400  Dressing Change Due 04/26/22 04/19/22 1400       Vernona Rieger  Dim Meisinger 04/19/2022, 3:19 PM

## 2022-04-19 NOTE — Telephone Encounter (Signed)
        Patient  visited [facility] on 04/15/2022  for vascular access program.   Telephone encounter attempt :  1st  A HIPAA compliant voice message was left requesting a return call.  Instructed patient to call back at [phone].   Khair Chasteen [facility]  [facility] Population Health Community Resource Care Guide   [email or web address]  ?? [phone]   Website: [email or web address]  [email or web address]

## 2022-04-20 ENCOUNTER — Encounter (INDEPENDENT_AMBULATORY_CARE_PROVIDER_SITE_OTHER): Payer: PPO

## 2022-04-20 ENCOUNTER — Ambulatory Visit (INDEPENDENT_AMBULATORY_CARE_PROVIDER_SITE_OTHER): Payer: PPO | Admitting: Nurse Practitioner

## 2022-04-20 ENCOUNTER — Telehealth: Payer: Self-pay

## 2022-04-20 ENCOUNTER — Other Ambulatory Visit: Payer: Self-pay

## 2022-04-20 DIAGNOSIS — M009 Pyogenic arthritis, unspecified: Secondary | ICD-10-CM | POA: Diagnosis not present

## 2022-04-20 DIAGNOSIS — Z8619 Personal history of other infectious and parasitic diseases: Secondary | ICD-10-CM | POA: Diagnosis not present

## 2022-04-20 DIAGNOSIS — Z9889 Other specified postprocedural states: Secondary | ICD-10-CM | POA: Diagnosis not present

## 2022-04-20 NOTE — Progress Notes (Signed)
This patient is appearing on the insurance-provided list for being at risk of failing the adherence measure for diabetes medications this calendar year. Patient is also appearing on list for failing statin use in persons with diabetes and statin use in persons with cardiovascular disease.   Medication:  -metformin (last dispensed in 2022) -Jardiance (last dispensed April 2023) -Ozempic (last dispensed June 2023)  Will contact PCP and request patient be started on statin therapy and restarted on the above medications if appropriate.   Erik Martinez, Pharm.D. PGY-2 Ambulatory Care Pharmacy Resident

## 2022-04-20 NOTE — Telephone Encounter (Signed)
     Patient  visit on 04/15/2022  at [facility] was for vascular access problem.  Have you been able to follow up with your primary care physician? Patient had to have [facility]  The patient was or was not able to obtain any needed medicine or equipment. No medication prescribed.  Are there diet recommendations that you are having difficulty following? No  Patient expresses understanding of discharge instructions and education provided has no other needs at this time. Yes   Erik Martinez [facility]  [facility] Population Health Community Resource Care Guide   [email or web address]  ?? [phone]   Website: [email or web address]  [email or web address]

## 2022-04-21 DIAGNOSIS — M869 Osteomyelitis, unspecified: Secondary | ICD-10-CM | POA: Diagnosis not present

## 2022-04-22 ENCOUNTER — Encounter: Payer: Self-pay | Admitting: Family Medicine

## 2022-04-22 ENCOUNTER — Ambulatory Visit (INDEPENDENT_AMBULATORY_CARE_PROVIDER_SITE_OTHER): Payer: PPO | Admitting: Family Medicine

## 2022-04-22 VITALS — BP 130/80 | HR 93 | Temp 98.1°F | Ht 68.75 in | Wt 193.5 lb

## 2022-04-22 DIAGNOSIS — E1169 Type 2 diabetes mellitus with other specified complication: Secondary | ICD-10-CM

## 2022-04-22 DIAGNOSIS — E1142 Type 2 diabetes mellitus with diabetic polyneuropathy: Secondary | ICD-10-CM | POA: Diagnosis not present

## 2022-04-22 DIAGNOSIS — E782 Mixed hyperlipidemia: Secondary | ICD-10-CM

## 2022-04-22 DIAGNOSIS — Z9889 Other specified postprocedural states: Secondary | ICD-10-CM | POA: Diagnosis not present

## 2022-04-22 DIAGNOSIS — L239 Allergic contact dermatitis, unspecified cause: Secondary | ICD-10-CM | POA: Insufficient documentation

## 2022-04-22 DIAGNOSIS — I70213 Atherosclerosis of native arteries of extremities with intermittent claudication, bilateral legs: Secondary | ICD-10-CM | POA: Diagnosis not present

## 2022-04-22 DIAGNOSIS — M009 Pyogenic arthritis, unspecified: Secondary | ICD-10-CM

## 2022-04-22 DIAGNOSIS — E785 Hyperlipidemia, unspecified: Secondary | ICD-10-CM | POA: Diagnosis not present

## 2022-04-22 DIAGNOSIS — Z8619 Personal history of other infectious and parasitic diseases: Secondary | ICD-10-CM

## 2022-04-22 LAB — HM DIABETES FOOT EXAM

## 2022-04-22 LAB — LAB REPORT - SCANNED: EGFR: 53

## 2022-04-22 MED ORDER — TRIAMCINOLONE ACETONIDE 0.5 % EX CREA: 1.0000 | TOPICAL_CREAM | Freq: Two times a day (BID) | CUTANEOUS | 0 refills | Status: DC

## 2022-04-22 NOTE — Progress Notes (Signed)
Patient ID: Erik Martinez, male    DOB: 07-19-1955, 67 y.o.   MRN: [id number]  This visit was conducted in person.  BP 130/80   Pulse 93   Temp 98.1 F (36.7 C) (Temporal)   Ht 5' 8.75" (1.746 m)   Wt 193 lb 8 oz (87.8 kg)   SpO2 98%   BMI 28.78 kg/m    CC:  Chief Complaint  Patient presents with   Hospitalization Follow-up    Subjective:   HPI: Erik Martinez is a 67 y.o. male presenting on 04/22/2022 for Hospitalization Follow-up  Of note he gets most his medications at the [place], so medication compliance looks incorrect.  He is compliant with all his medications.   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 Most recently removal of prosthesis on April 01, 2022 Dr. Ernest Pine: extensive left knee synovectomy, removal of total knee implants, placement of antibiotic impregnated methylmethacrylate articulating spacer, placement of Stimulan antibiotic beads/bullets    On 6 weeks of antibiotic thorough PICC lien.. has itching around  Antihistamine and benadryl not helping.   Diabetes:   On metformin 100 mg BID, ozempic 0.5 mg weekly  Lab Results  Component Value Date   HGBA1C 7.1 (H) 03/28/2022  Using medications without difficulties: Hypoglycemic episodes: none Hyperglycemic episodes: none Feet problems: no ulcers Blood Sugars averaging: FBS 135 eye exam within last year: due  Due for renal eval Urine ACR   BP Readings from Last 3 Encounters:  04/22/22 130/80  04/15/22 (!) 154/85  04/04/22 (!) 186/82     Wt Readings from Last 3 Encounters:  04/22/22 193 lb 8 oz (87.8 kg)  04/15/22 180 lb (81.6 kg)  04/01/22 185 lb (83.9 kg)    Neuropathy stable       Relevant past medical, surgical, family and social history reviewed and updated as indicated. Interim medical history since our last visit reviewed. Allergies and medications reviewed and updated. Outpatient Medications Prior to Visit   Medication Sig Dispense Refill   acetaminophen (TYLENOL) 500 MG tablet Take 1,500 mg by mouth 2 (two) times daily as needed for moderate pain or mild pain.     aspirin EC 81 MG tablet Take 81 mg by mouth daily. Swallow whole.     atorvastatin (LIPITOR) 20 MG tablet Take 20 mg by mouth at bedtime.     celecoxib (CELEBREX) 200 MG capsule Take 200 mg by mouth 2 (two) times daily.     cyclobenzaprine (FLEXERIL) 10 MG tablet SMARTSIG:1 Tablet(s) By Mouth Every 12 Hours     empagliflozin (JARDIANCE) 25 MG TABS tablet Take 1 tablet (25 mg total) by mouth daily. 90 tablet 1   enoxaparin (LOVENOX) 40 MG/0.4ML injection Inject 0.4 mLs (40 mg total) into the skin daily. 5.6 mL 0   ferrous sulfate 324 MG TBEC TAKE ONE TABLET BY MOUTH MONDAY,WEDNESDAY,FRIDAY     lidocaine (LIDODERM) 5 % Place 1 patch onto the skin daily. Remove & Discard patch within 12 hours or as directed by MD 30 patch 0   metFORMIN (GLUCOPHAGE) 1000 MG tablet Take 1,000 mg by mouth 2 (two) times daily.     Multiple Vitamin (MULTIVITAMIN WITH MINERALS) TABS tablet Take 1 tablet by mouth in the morning.     nafcillin IVPB Inject 12 g into the vein continuous. Infuse Nafcillin 12gm daily as continuous infusion over 24 hours.   Indication:  prosthetic joint infection of left knee  First Dose: Yes Last Day of Therapy:  05/13/2022 Labs - Once weekly:  CBC/D, CMP, ESR and CRP Please pull PIC at completion of IV antibiotics Fax weekly lab results  promptly to [phone] Method of administration: Elastomeric (Continuous infusion) Method of administration may be changed at the discretion of home infusion pharmacist based upon assessment of the patient and/or caregiver's ability to self-administer the medication ordered. 40 Units 0   ondansetron (ZOFRAN) 4 MG tablet Take 1 tablet (4 mg total) by mouth every 8 (eight) hours as needed for nausea or vomiting. 20 tablet 1   Oxycodone HCl 20 MG TABS Take 1 tablet by mouth every 6 (six) hours as  needed.     OZEMPIC, 0.25 OR 0.5 MG/DOSE, 2 MG/3ML SOPN Inject 0.5 mg into the skin once a week. Monday     pregabalin (LYRICA) 200 MG capsule Take 1 capsule (200 mg total) by mouth in the morning, at noon, and at bedtime. 90 capsule 0   tadalafil (CIALIS) 20 MG tablet Take 20 mg by mouth daily as needed for erectile dysfunction.     tiZANidine (ZANAFLEX) 4 MG tablet Take 4 mg by mouth 3 (three) times daily as needed.     zolpidem (AMBIEN) 10 MG tablet Take 1 tablet (10 mg total) by mouth at bedtime as needed. for sleep 30 tablet 2   pantoprazole (PROTONIX) 40 MG tablet Take 1 tablet (40 mg total) by mouth daily. 90 tablet 3   oxyCODONE (OXY IR/ROXICODONE) 5 MG immediate release tablet Take 1-2 tablets (5-10 mg total) by mouth every 4 (four) hours as needed for breakthrough pain. 30 tablet 0   oxyCODONE (ROXICODONE) 15 MG immediate release tablet Take 15 mg by mouth every 6 (six) hours.     No facility-administered medications prior to visit.     Per HPI unless specifically indicated in ROS section below Review of Systems  Constitutional:  Negative for fatigue and fever.  HENT:  Negative for ear pain.   Eyes:  Negative for pain.  Respiratory:  Negative for cough and shortness of breath.   Cardiovascular:  Negative for chest pain, palpitations and leg swelling.  Gastrointestinal:  Negative for abdominal pain.  Genitourinary:  Negative for dysuria.  Musculoskeletal:  Negative for arthralgias.  Neurological:  Negative for syncope, light-headedness and headaches.  Psychiatric/Behavioral:  Negative for dysphoric mood.    Objective:  BP 130/80   Pulse 93   Temp 98.1 F (36.7 C) (Temporal)   Ht 5' 8.75" (1.746 m)   Wt 193 lb 8 oz (87.8 kg)   SpO2 98%   BMI 28.78 kg/m   Wt Readings from Last 3 Encounters:  04/22/22 193 lb 8 oz (87.8 kg)  04/15/22 180 lb (81.6 kg)  04/01/22 185 lb (83.9 kg)      Physical Exam Constitutional:      Appearance: He is well-developed.  HENT:     Head:  Normocephalic.     Right Ear: Hearing normal.     Left Ear: Hearing normal.     Nose: Nose normal.  Neck:     Thyroid: No thyroid mass or thyromegaly.     Vascular: No carotid bruit.     Trachea: Trachea normal.  Cardiovascular:     Rate and Rhythm: Normal rate and regular rhythm.     Pulses: Normal pulses.     Heart sounds: Heart sounds not distant. No murmur heard.    No friction rub. No gallop.     Comments:  No peripheral edema Pulmonary:     Effort: Pulmonary effort is normal. No respiratory distress.     Breath sounds: Normal breath sounds.  Skin:    General: Skin is warm and dry.     Findings: Rash present.     Comments: Erythema and itching around bandage for PICC line, no evidence of erythema at site of PICC line entry  Psychiatric:        Speech: Speech normal.        Behavior: Behavior normal.        Thought Content: Thought content normal.        Diabetic foot exam: Normal inspection No skin breakdown Small calluses  Normal DP pulses Decreased sensation to light touch and monofilament Nails thickened and with fungal infection on right  Results for orders placed or performed in visit on 04/22/22  HM DIABETES FOOT EXAM  Result Value Ref Range   HM Diabetic Foot Exam done     Assessment and Plan  Type 2 diabetes mellitus with diabetic polyneuropathy, without long-term current use of insulin Assessment & Plan: Chronic, most recent A1c on our lab work shows moderate control almost at goal.  Patient reports more recent A1c at 6.9. He is also seen at the [place] and they have encouraged him to consider increasing Ozempic to 1 mg weekly.  We will move forward with Ozempic 1 mg weekly, continue metformin 1000 mg twice daily and Jardiance 25 mg daily Other medications prescribed at [place].   Mixed hyperlipidemia  Diabetic peripheral neuropathy Assessment & Plan: Chronic, stable control due to diabetes On lyrica.Marland Kitchen works best as 200 mg three times a  day.        Joint infection  Hyperlipidemia associated with type 2 diabetes mellitus Assessment & Plan: Chronic, followed at the [place].  Per patient within normal range on atorvastatin. 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 Currently on PICC line antibiotics x 6 weeks with plan to replace joint prosthesis after infection resolved.   Allergic dermatitis Assessment & Plan: Acute at the PICC line site.  No sign of infection.  PICC line in place.  Will treat with topical triamcinolone 0.5 mg twice daily around bandage edge at rash site.   Other orders -     Triamcinolone Acetonide; Apply 1 Application topically 2 (two) times daily.  Dispense: 30 g; Refill: 0    Return in about 6 months (around 10/22/2022) for anuual physical with fasting labs prior.   Kerby Nora, MD

## 2022-04-22 NOTE — Assessment & Plan Note (Addendum)
Chronic, most recent A1c on our lab work shows moderate control almost at goal.  Patient reports more recent A1c at 6.9. He is also seen at the [place] and they have encouraged him to consider increasing Ozempic to 1 mg weekly.  We will move forward with Ozempic 1 mg weekly, continue metformin 1000 mg twice daily and Jardiance 25 mg daily Other medications prescribed at [place].

## 2022-04-22 NOTE — Patient Instructions (Signed)
Increase Ozempic to 1 mg weekly.. call if not tolerating. Set up yearly eye exam for diabetes and have the opthalmologist send [place] a copy of the evaluation for the chart.

## 2022-04-22 NOTE — Assessment & Plan Note (Signed)
Chronic, followed at the [place].  Per patient within normal range on atorvastatin. Lab Results  Component Value Date   CHOL 310 (A) 06/19/2020   HDL 49 06/19/2020   LDLCALC 196 06/19/2020   TRIG 323 (A) 06/19/2020   CHOLHDL 2 11/22/2017

## 2022-04-22 NOTE — Assessment & Plan Note (Signed)
Chronic, stable control due to diabetes On lyrica.Marland Kitchen works best as 200 mg three times a day.

## 2022-04-22 NOTE — Assessment & Plan Note (Signed)
On statin.

## 2022-04-22 NOTE — Assessment & Plan Note (Signed)
Followed by Ortho. Currently on PICC line antibiotics x 6 weeks with plan to replace joint prosthesis after infection resolved.

## 2022-04-22 NOTE — Assessment & Plan Note (Signed)
Acute at the PICC line site.  No sign of infection.  PICC line in place.  Will treat with topical triamcinolone 0.5 mg twice daily around bandage edge at rash site.

## 2022-04-26 DIAGNOSIS — M160 Bilateral primary osteoarthritis of hip: Secondary | ICD-10-CM | POA: Diagnosis not present

## 2022-04-26 DIAGNOSIS — Z981 Arthrodesis status: Secondary | ICD-10-CM | POA: Diagnosis not present

## 2022-04-26 DIAGNOSIS — M4802 Spinal stenosis, cervical region: Secondary | ICD-10-CM | POA: Diagnosis not present

## 2022-04-26 DIAGNOSIS — Z79891 Long term (current) use of opiate analgesic: Secondary | ICD-10-CM | POA: Diagnosis not present

## 2022-04-26 DIAGNOSIS — M5136 Other intervertebral disc degeneration, lumbar region: Secondary | ICD-10-CM | POA: Diagnosis not present

## 2022-04-26 DIAGNOSIS — G894 Chronic pain syndrome: Secondary | ICD-10-CM | POA: Diagnosis not present

## 2022-04-26 DIAGNOSIS — M25512 Pain in left shoulder: Secondary | ICD-10-CM | POA: Diagnosis not present

## 2022-04-26 DIAGNOSIS — M545 Low back pain, unspecified: Secondary | ICD-10-CM | POA: Diagnosis not present

## 2022-04-26 DIAGNOSIS — G890 Central pain syndrome: Secondary | ICD-10-CM | POA: Diagnosis not present

## 2022-04-26 DIAGNOSIS — T8454XA Infection and inflammatory reaction due to internal left knee prosthesis, initial encounter: Secondary | ICD-10-CM | POA: Diagnosis not present

## 2022-04-27 DIAGNOSIS — Z8619 Personal history of other infectious and parasitic diseases: Secondary | ICD-10-CM | POA: Diagnosis not present

## 2022-04-27 DIAGNOSIS — Z9889 Other specified postprocedural states: Secondary | ICD-10-CM | POA: Diagnosis not present

## 2022-04-27 DIAGNOSIS — M009 Pyogenic arthritis, unspecified: Secondary | ICD-10-CM | POA: Diagnosis not present

## 2022-04-27 LAB — LAB REPORT - SCANNED: EGFR: 62

## 2022-04-28 ENCOUNTER — Encounter: Payer: Self-pay | Admitting: Infectious Diseases

## 2022-04-28 ENCOUNTER — Ambulatory Visit: Payer: PPO | Attending: Infectious Diseases | Admitting: Infectious Diseases

## 2022-04-28 VITALS — BP 159/83 | HR 98 | Temp 96.6°F | Ht 70.0 in | Wt 190.0 lb

## 2022-04-28 DIAGNOSIS — Z981 Arthrodesis status: Secondary | ICD-10-CM | POA: Diagnosis not present

## 2022-04-28 DIAGNOSIS — I10 Essential (primary) hypertension: Secondary | ICD-10-CM | POA: Diagnosis not present

## 2022-04-28 DIAGNOSIS — T8454XD Infection and inflammatory reaction due to internal left knee prosthesis, subsequent encounter: Secondary | ICD-10-CM | POA: Diagnosis not present

## 2022-04-28 DIAGNOSIS — E785 Hyperlipidemia, unspecified: Secondary | ICD-10-CM | POA: Insufficient documentation

## 2022-04-28 DIAGNOSIS — E1151 Type 2 diabetes mellitus with diabetic peripheral angiopathy without gangrene: Secondary | ICD-10-CM | POA: Diagnosis not present

## 2022-04-28 DIAGNOSIS — Z96642 Presence of left artificial hip joint: Secondary | ICD-10-CM | POA: Diagnosis not present

## 2022-04-28 DIAGNOSIS — Z7984 Long term (current) use of oral hypoglycemic drugs: Secondary | ICD-10-CM | POA: Insufficient documentation

## 2022-04-28 DIAGNOSIS — T8454XA Infection and inflammatory reaction due to internal left knee prosthesis, initial encounter: Secondary | ICD-10-CM | POA: Insufficient documentation

## 2022-04-28 DIAGNOSIS — Z96651 Presence of right artificial knee joint: Secondary | ICD-10-CM | POA: Diagnosis not present

## 2022-04-28 DIAGNOSIS — B1920 Unspecified viral hepatitis C without hepatic coma: Secondary | ICD-10-CM | POA: Diagnosis not present

## 2022-04-28 DIAGNOSIS — X58XXXA Exposure to other specified factors, initial encounter: Secondary | ICD-10-CM | POA: Insufficient documentation

## 2022-04-28 DIAGNOSIS — A4901 Methicillin susceptible Staphylococcus aureus infection, unspecified site: Secondary | ICD-10-CM | POA: Insufficient documentation

## 2022-04-28 DIAGNOSIS — T8450XD Infection and inflammatory reaction due to unspecified internal joint prosthesis, subsequent encounter: Secondary | ICD-10-CM

## 2022-04-28 NOTE — Patient Instructions (Signed)
You are here for follow up for the left knee infection- you will complete 6 weeks of Iv on 05/13/22- will discuss with Dr.Hooten when we will plan stage 2-

## 2022-04-28 NOTE — Progress Notes (Signed)
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  Cultures were MSSA. He is on continuous IV infusion of nafcillin. ? Erik Martinez is a 67 y.o. 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 He had some swelling and went to see ortho on 09/08/21- asked to take NSAID and labs ordered. On 9/1 patient called ortho as he had severe drainage of yellow, purulent stuff from the surgical site. A culture was taken and he was asked to get admitted On 09/11/21 he underwent I/D of left knee with polyethylene exchange and placement of antibiotic beads Culture positive for MSSA He underwent washout and exchange of the femoral component and liner- HE was sent on Iv cefazolin for a total of [redacted] weeks along with rifampin 300mg  PO BID because of retained hardware HE finished 6 weeks on 10/22/21 and went on  Po antibiotics He was not compliant.  There was recurrence of the infection.  And he was hospitalized in March 2024 and underwent surgery.  He is feeling better now He is waiting to get his second surgery He will finish 6 weeks of nafcillin on 05/13/2022 He has a follow-up appointment with Dr. Ernest Pine on 05/12/2022 I Past Medical History:  Diagnosis Date   Anxiety    Arthritis    Avascular necrosis of bones of both hips    Bipolar disorder    Bladder spasms    Chronic pain of left knee    Chronic pain syndrome    a.) followed by pain clinic   Cocaine use disorder, moderate,  dependence 09/09/2015   DDD (degenerative disc disease)    Depression    Erectile dysfunction    a.) on PDE5i (sildenafil)   Frequency-urgency syndrome    GERD (gastroesophageal reflux disease)    Hepatitis C 2018   History of hepatitis B    1983  TX'D--  NO ISSUES OR SYMPTOMS SINCE   Hyperlipidemia    Hypotestosteronemia    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   Neurogenic bladder    Nocturia    PAD (peripheral artery disease)    Polysubstance abuse    a.) cocaine + marijuana + BZO + opioids   PONV (postoperative nausea and vomiting)    "difficult to put me to sleep"   Spinal headache    T2DM (type 2 diabetes mellitus)    Urine incontinence     Past Surgical History:  Procedure Laterality Date   APPLICATION OF WOUND VAC Left 09/11/2021   Procedure: APPLICATION OF WOUND VAC;  Surgeon: Donato Heinz, MD;  Location: [facility];  Service: Orthopedics;  Laterality: Left;  [id number]    BACK SURGERY     2 rods and 4 screws artificial disc   COLONOSCOPY  2008   INTERSTIM IMPLANT PLACEMENT  2007   INTERSTIM IMPLANT REVISION N/A 08/17/2012   Procedure: REPLACMENT OF IPG PLUS REPLACE LEAD OF INTERSTIM IMPLANT ;  Surgeon: Martina Sinner, MD;  Location: [facility];  Service: Urology;  Laterality: N/A;   IRRIGATION AND DEBRIDEMENT KNEE Left 09/11/2021   Procedure: IRRIGATION AND DEBRIDEMENT WITH POLY EXCHANGE LEFT KNEE;  Surgeon: Donato Heinz, MD;  Location: [facility];  Service: Orthopedics;  Laterality: Left;   LOWER EXTREMITY ANGIOGRAPHY Left 11/17/2020   Procedure: LOWER EXTREMITY ANGIOGRAPHY;  Surgeon: Renford Dills, MD;  Location: [facility];  Service: Cardiovascular;  Laterality: Left;   LOWER EXTREMITY ANGIOGRAPHY Right 12/22/2020   Procedure: LOWER EXTREMITY ANGIOGRAPHY;  Surgeon: Renford Dills, MD;  Location: [facility];  Service: Cardiovascular;  Laterality: Right;   LOWER EXTREMITY ANGIOGRAPHY Left 12/14/2021   Procedure: Lower  Extremity Angiography;  Surgeon: Renford Dills, MD;  Location: [facility];  Service: Cardiovascular;  Laterality: Left;   LUMBAR DISC SURGERY  2005   L5   LUMBAR FUSION  X2  2006  &  2007   MANIPULATION KNEE JOINT Left 08/2002   OPEN DEBRIDEMENT LEFT TOTAL KNEE (SCAR, BONEY GRAOWTH)/ REMOVAL OLD SUTURES  05/01/2007   PARTIAL HIP ARTHROPLASTY Left 2001   x2   SHOULDER OPEN ROTATOR CUFF REPAIR Left 2000   TOTAL HIP ARTHROPLASTY Left 2002   TOTAL KNEE ARTHROPLASTY Left 06/2002   PARTIAL LEFT KNEE REPLACEMENT PRIOR TO THIS   TOTAL KNEE REVISION Left 2008   TOTAL KNEE REVISION Left 07/19/2021   Procedure: TOTAL KNEE REVISION;  Surgeon: Donato Heinz, MD;  Location: [facility];  Service: Orthopedics;  Laterality: Left;   TOTAL KNEE REVISION Left 2005   TOTAL KNEE REVISION Left 04/01/2022   Procedure: Removal of left total knee implants with insertion of spacer.;  Surgeon: Donato Heinz, MD;  Location: [facility];  Service: Orthopedics;  Laterality: Left;   TRIGGER FINGER RELEASE Bilateral    SEVERAL FINGERS   TRIGGER FINGER RELEASE Right 06/18/2014   Procedure: RELEASE TRIGGER FINGER/A-1 PULLEY;  Surgeon: Donato Heinz, MD;  Location: [facility];  Service: Orthopedics;  Laterality: Right;    Social History   Socioeconomic History   Marital status: Divorced    Spouse name: Not on file   Number of children: 3   Years of education: Not on file   Highest education level: Some college, no degree  Occupational History   Not on file  Tobacco Use   Smoking status: Never   Smokeless tobacco: Never  Vaping Use   Vaping Use: Never used  Substance and Sexual Activity   Alcohol use: Not Currently    Alcohol/week: 0.0 - 2.0 standard drinks of alcohol   Drug use: Not Currently    Types: Cocaine    Comment: last + cocaine UDS 07/2016   Sexual activity: Not Currently  Other Topics Concern   Not on file  Social History Narrative    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, partime   Social  Determinants of Health   Financial Resource Strain: Low Risk  (01/28/2022)   Overall Financial Resource Strain (CARDIA)    Difficulty of Paying Living Expenses: Not hard at all  Food Insecurity: No Food Insecurity (04/01/2022)   Hunger Vital Sign    Worried About Running Out of Food in the Last Year: Never true    Ran Out of Food in the Last Year: Never true  Transportation Needs: No Transportation Needs (04/01/2022)   PRAPARE - Administrator, Civil Service (Medical): No    Lack of Transportation (Non-Medical): No  Physical Activity: Inactive (01/28/2022)  Exercise Vital Sign    Days of Exercise per Week: 0 days    Minutes of Exercise per Session: 0 min  Stress: No Stress Concern Present (01/28/2022)   [employer] of Occupational Health - Occupational Stress Questionnaire    Feeling of Stress : Not at all  Social Connections: Moderately Isolated (01/28/2022)   Social Connection and Isolation Panel [NHANES]    Frequency of Communication with Friends and Family: More than three times a week    Frequency of Social Gatherings with Friends and Family: More than three times a week    Attends Religious Services: More than 4 times per year    Active Member of [employer] or Organizations: No    Attends Banker Meetings: Never    Marital Status: Divorced  Catering manager Violence: Not At Risk (04/01/2022)   Humiliation, Afraid, Rape, and Kick questionnaire    Fear of Current or Ex-Partner: No    Emotionally Abused: No    Physically Abused: No    Sexually Abused: No    Family History  Problem Relation Age of Onset   Diabetes Mother    Arthritis Mother    Cancer Mother        lung cancer   Hyperlipidemia Mother    Stroke Mother    Alcohol abuse Father    Cancer Father        lung   Arthritis Father    Diabetes Brother    Allergies  Allergen Reactions   Suboxone [Buprenorphine Hcl-Naloxone Hcl] Nausea And Vomiting   Tape Other (See Comments)     Whelps *Paper Tape is ok    Codeine Rash, Itching and Nausea Only   Morphine And Related Itching and Rash   Other Rash    Telemetry electrodes   Silicone Rash    Whelps -*Paper Tape is ok    I? Current Outpatient Medications  Medication Sig Dispense Refill   acetaminophen (TYLENOL) 500 MG tablet Take 1,500 mg by mouth 2 (two) times daily as needed for moderate pain or mild pain.     aspirin EC 81 MG tablet Take 81 mg by mouth daily. Swallow whole.     atorvastatin (LIPITOR) 20 MG tablet Take 20 mg by mouth at bedtime.     celecoxib (CELEBREX) 200 MG capsule Take 200 mg by mouth 2 (two) times daily.     cyclobenzaprine (FLEXERIL) 10 MG tablet SMARTSIG:1 Tablet(s) By Mouth Every 12 Hours     empagliflozin (JARDIANCE) 25 MG TABS tablet Take 1 tablet (25 mg total) by mouth daily. 90 tablet 1   enoxaparin (LOVENOX) 40 MG/0.4ML injection Inject 0.4 mLs (40 mg total) into the skin daily. 5.6 mL 0   ferrous sulfate 324 MG TBEC TAKE ONE TABLET BY MOUTH MONDAY,WEDNESDAY,FRIDAY     lidocaine (LIDODERM) 5 % Place 1 patch onto the skin daily. Remove & Discard patch within 12 hours or as directed by MD 30 patch 0   metFORMIN (GLUCOPHAGE) 1000 MG tablet Take 1,000 mg by mouth 2 (two) times daily.     Multiple Vitamin (MULTIVITAMIN WITH MINERALS) TABS tablet Take 1 tablet by mouth in the morning.     nafcillin IVPB Inject 12 g into the vein continuous. Infuse Nafcillin 12gm daily as continuous infusion over 24 hours.   Indication:  prosthetic joint infection of left knee First Dose: Yes Last Day of Therapy:  05/13/2022 Labs - Once weekly:  CBC/D, CMP, ESR and CRP Please pull PIC at completion of IV  antibiotics Fax weekly lab results  promptly to [phone] Method of administration: Elastomeric (Continuous infusion) Method of administration may be changed at the discretion of home infusion pharmacist based upon assessment of the patient and/or caregiver's ability to self-administer the medication  ordered. 40 Units 0   ondansetron (ZOFRAN) 4 MG tablet Take 1 tablet (4 mg total) by mouth every 8 (eight) hours as needed for nausea or vomiting. 20 tablet 1   Oxycodone HCl 20 MG TABS Take 1 tablet by mouth every 6 (six) hours as needed.     OZEMPIC, 0.25 OR 0.5 MG/DOSE, 2 MG/3ML SOPN Inject 0.5 mg into the skin once a week. Monday     pregabalin (LYRICA) 200 MG capsule Take 1 capsule (200 mg total) by mouth in the morning, at noon, and at bedtime. 90 capsule 0   tadalafil (CIALIS) 20 MG tablet Take 20 mg by mouth daily as needed for erectile dysfunction.     tiZANidine (ZANAFLEX) 4 MG tablet Take 4 mg by mouth 3 (three) times daily as needed.     triamcinolone cream (KENALOG) 0.5 % Apply 1 Application topically 2 (two) times daily. 30 g 0   zolpidem (AMBIEN) 10 MG tablet Take 1 tablet (10 mg total) by mouth at bedtime as needed. for sleep 30 tablet 2   pantoprazole (PROTONIX) 40 MG tablet Take 1 tablet (40 mg total) by mouth daily. 90 tablet 3   No current facility-administered medications for this visit.     Abtx:  Anti-infectives (From admission, onward)    None       REVIEW OF SYSTEMS:  Const: negative fever, negative chills, negative weight loss Eyes: negative diplopia or visual changes, negative eye pain ENT: negative coryza, negative sore throat Resp: negative cough, hemoptysis, dyspnea Cards: negative for chest pain, palpitations, lower extremity edema GU: negative for frequency, dysuria and hematuria GI: Negative for abdominal pain, diarrhea, bleeding, constipation Skin: negative for rash and pruritus Heme: negative for easy bruising and gum/nose bleeding MS: pain,  stiffness left knee Neurolo:negative for headaches, dizziness, vertigo, memory problems  Psych:  anxiety Endocrine: , diabetes Allergy/Immunology- as above ? Objective:  VITALS:  BP (!) 159/83   Pulse 98   Temp (!) 96.6 F (35.9 C) (Temporal)   Ht  (1.778 m)   Wt 190 lb (86.2 kg)   BMI 27.26  kg/m   PHYSICAL EXAM:  General: Alert, cooperative, no distress, appears stated age.  Head: Normocephalic, without obvious abnormality, atraumatic. Eyes: Conjunctivae clear, anicteric sclerae. Pupils are equal ENT Nares normal. No drainage or sinus tenderness. Lips, mucosa, and tongue normal. No Thrush Neck: Supple, symmetrical, no adenopathy, thyroid: non tender no carotid bruit and no JVD. Back: No CVA tenderness. Lungs: Clear to auscultation bilaterally. No Wheezing or Rhonchi. No rales. Heart: Regular rate and rhythm, no murmur, rub or gallop. Abdomen: Soft, non-tender,not distended. Bowel sounds normal. No masses Extremities: left knee surgical scar healed well Skin: No rashes or lesions. Or bruising Lymph: Cervical, supraclavicular normal. Neurologic: Grossly non-focal Pertinent Labs Labs done on 04/26/2022 shows WBC of 4.1, hemoglobin 9.9, platelet 184, creatinine 1.29, sed rate 54 and C-reactive protein 14 ? Impression/Recommendation ? ?Left knee PJI with MSSA-recurrent infections of status post explantation of the prosthesis and now has an antibiotic spacer. He is getting 6 weeks of IV nafcillin and will finish on 05/13/2022. Will discuss with Dr. Ernest Pine regarding the timing of the second stage surgery  and the need for repeat aspiration of the knee  versus lab test  ESR 140> 68 > 94>54 CRP > 10.4  Treated hep C   DM on metformin and ozempic   HTN on atenolol and clonidine   HLD on atorvastatin   ? Discussed management with the patient in detail follow-up in 1 month   note:  This document was prepared using Dragon voice recognition software and may include unintentional dictation errors.

## 2022-05-04 DIAGNOSIS — T8454XA Infection and inflammatory reaction due to internal left knee prosthesis, initial encounter: Secondary | ICD-10-CM | POA: Diagnosis not present

## 2022-05-04 DIAGNOSIS — M009 Pyogenic arthritis, unspecified: Secondary | ICD-10-CM | POA: Diagnosis not present

## 2022-05-04 DIAGNOSIS — Z9889 Other specified postprocedural states: Secondary | ICD-10-CM | POA: Diagnosis not present

## 2022-05-04 DIAGNOSIS — Z8619 Personal history of other infectious and parasitic diseases: Secondary | ICD-10-CM | POA: Diagnosis not present

## 2022-05-05 ENCOUNTER — Telehealth: Payer: Self-pay

## 2022-05-05 ENCOUNTER — Other Ambulatory Visit: Payer: Self-pay | Admitting: Internal Medicine

## 2022-05-05 DIAGNOSIS — T8450XD Infection and inflammatory reaction due to unspecified internal joint prosthesis, subsequent encounter: Secondary | ICD-10-CM

## 2022-05-05 NOTE — Telephone Encounter (Signed)
Erik Martinez with [facility] called office stating patient's picc line is exposed 1.5 cm. Was previously 1 cm. This happened one week ago. Two days ago patient noticed picc line has been draining. States that draining is at insertion site. Is covered with piece of quaze which is barely drenched.  Patient is scheduled for end date on 5/2.  Will forward message to provider to advise on message. Juanita Laster, RMA

## 2022-05-05 NOTE — Telephone Encounter (Signed)
Called patient regarding picc line appointment scheduled for tomorrow at 3. Pt is refusing to have picc line replaced.  Requested appt be canceled due to leaving out of town. Informed pt I would cancel appointment, but if he has any concerns to go to ED.  Verbalized understanding. Juanita Laster, RMA

## 2022-05-05 NOTE — Telephone Encounter (Signed)
Spoke with Dr. Drue Second who recommended patient have picc line dressed. Patient can either go to ED or schedule appt with IR.  Patient updated, states he does not want to got to ED. Is requesting staff try to schedule appointment with Ir for replacement.  Left voicemail with IR requesting call back to schedule urgent appt.  Patient understands if he notices increase drainage, swelling, pain, redness, or warmth at site to go to ED.  [place] [phone]  Juanita Laster, RMA

## 2022-05-06 ENCOUNTER — Ambulatory Visit: Payer: PPO | Admitting: Radiology

## 2022-05-06 ENCOUNTER — Telehealth: Payer: Self-pay | Admitting: *Deleted

## 2022-05-06 NOTE — Telephone Encounter (Signed)
Received from [employer] PAP:  Ozempic  4 mg/3 ml pens x 4 boxes.  Lot # [id number] Exp: 08/09/2024.  Left message for Mr. Vanblarcom that medication is here at the office ready to be picked up.

## 2022-05-11 NOTE — Telephone Encounter (Signed)
Patient came to pick up medication. 

## 2022-05-12 ENCOUNTER — Other Ambulatory Visit
Admission: RE | Admit: 2022-05-12 | Discharge: 2022-05-12 | Disposition: A | Discharge status: Home / Self Care | Payer: PPO | Source: Ambulatory Visit | Attending: Sports Medicine | Admitting: Sports Medicine

## 2022-05-12 DIAGNOSIS — Z96652 Presence of left artificial knee joint: Secondary | ICD-10-CM | POA: Diagnosis not present

## 2022-05-12 DIAGNOSIS — M25462 Effusion, left knee: Secondary | ICD-10-CM | POA: Diagnosis not present

## 2022-05-12 LAB — SYNOVIAL CELL COUNT + DIFF, W/ CRYSTALS
Crystals, Fluid: NONE SEEN
Eosinophils-Synovial: 0 %
Lymphocytes-Synovial Fld: 35 %
Monocyte-Macrophage-Synovial Fluid: 16 %
Neutrophil, Synovial: 49 %
WBC, Synovial: 166 /mm3 (ref 0–200)

## 2022-05-20 DIAGNOSIS — G890 Central pain syndrome: Secondary | ICD-10-CM | POA: Diagnosis not present

## 2022-05-20 DIAGNOSIS — M5136 Other intervertebral disc degeneration, lumbar region: Secondary | ICD-10-CM | POA: Diagnosis not present

## 2022-05-20 DIAGNOSIS — M25512 Pain in left shoulder: Secondary | ICD-10-CM | POA: Diagnosis not present

## 2022-05-20 DIAGNOSIS — M545 Low back pain, unspecified: Secondary | ICD-10-CM | POA: Diagnosis not present

## 2022-05-20 DIAGNOSIS — M4802 Spinal stenosis, cervical region: Secondary | ICD-10-CM | POA: Diagnosis not present

## 2022-05-20 DIAGNOSIS — G894 Chronic pain syndrome: Secondary | ICD-10-CM | POA: Diagnosis not present

## 2022-05-20 DIAGNOSIS — M160 Bilateral primary osteoarthritis of hip: Secondary | ICD-10-CM | POA: Diagnosis not present

## 2022-05-20 DIAGNOSIS — Z79891 Long term (current) use of opiate analgesic: Secondary | ICD-10-CM | POA: Diagnosis not present

## 2022-05-20 DIAGNOSIS — Z981 Arthrodesis status: Secondary | ICD-10-CM | POA: Diagnosis not present

## 2022-05-25 ENCOUNTER — Encounter
Admission: RE | Admit: 2022-05-25 | Discharge: 2022-05-25 | Disposition: A | Discharge status: Home / Self Care | Payer: PPO | Source: Ambulatory Visit | Attending: Orthopedic Surgery | Admitting: Orthopedic Surgery

## 2022-05-25 ENCOUNTER — Other Ambulatory Visit: Payer: Self-pay

## 2022-05-25 VITALS — BP 149/87 | HR 109 | Resp 18 | Wt 194.4 lb

## 2022-05-25 DIAGNOSIS — E1142 Type 2 diabetes mellitus with diabetic polyneuropathy: Secondary | ICD-10-CM | POA: Diagnosis not present

## 2022-05-25 DIAGNOSIS — Z01812 Encounter for preprocedural laboratory examination: Secondary | ICD-10-CM | POA: Insufficient documentation

## 2022-05-25 HISTORY — DX: Anemia, unspecified: D64.9

## 2022-05-25 LAB — COMPREHENSIVE METABOLIC PANEL
ALT: 12 U/L (ref 0–44)
AST: 15 U/L (ref 15–41)
Albumin: 3.8 g/dL (ref 3.5–5.0)
Alkaline Phosphatase: 85 U/L (ref 38–126)
Anion gap: 8 (ref 5–15)
BUN: 25 mg/dL — ABNORMAL HIGH (ref 8–23)
CO2: 26 mmol/L (ref 22–32)
Calcium: 9.3 mg/dL (ref 8.9–10.3)
Chloride: 101 mmol/L (ref 98–111)
Creatinine, Ser: 1.2 mg/dL (ref 0.61–1.24)
GFR, Estimated: >60 mL/min (ref >60)
Glucose, Bld: 153 mg/dL — ABNORMAL HIGH (ref 70–99)
Potassium: 3.9 mmol/L (ref 3.5–5.1)
Sodium: 135 mmol/L (ref 135–145)
Total Bilirubin: 1.3 mg/dL — ABNORMAL HIGH (ref 0.3–1.2)
Total Protein: 8.5 g/dL — ABNORMAL HIGH (ref 6.5–8.1)

## 2022-05-25 LAB — CBC
HCT: 33.6 % — ABNORMAL LOW (ref 39.0–52.0)
Hemoglobin: 10.6 g/dL — ABNORMAL LOW (ref 13.0–17.0)
MCH: 26.4 pg (ref 26.0–34.0)
MCHC: 31.5 g/dL (ref 30.0–36.0)
MCV: 83.8 fL (ref 80.0–100.0)
Platelets: 203 10*3/uL (ref 150–400)
RBC: 4.01 MIL/uL — ABNORMAL LOW (ref 4.22–5.81)
RDW: 18.6 % — ABNORMAL HIGH (ref 11.5–15.5)
WBC: 5.2 10*3/uL (ref 4.0–10.5)
nRBC: 0 % (ref 0.0–0.2)

## 2022-05-25 LAB — C-REACTIVE PROTEIN: CRP: 5.9 mg/dL — ABNORMAL HIGH (ref <1.0)

## 2022-05-25 LAB — URINALYSIS, ROUTINE W REFLEX MICROSCOPIC
Bacteria, UA: NONE SEEN
Bilirubin Urine: NEGATIVE
Glucose, UA: >=500 mg/dL — AB
Ketones, ur: NEGATIVE mg/dL
Leukocytes,Ua: NEGATIVE
Nitrite: NEGATIVE
Protein, ur: NEGATIVE mg/dL
Specific Gravity, Urine: 1.02 (ref 1.005–1.030)
Squamous Epithelial / HPF: NONE SEEN /HPF (ref 0–5)
pH: 5 (ref 5.0–8.0)

## 2022-05-25 LAB — HEMOGLOBIN A1C
Hgb A1c MFr Bld: 5.3 % (ref 4.8–5.6)
Mean Plasma Glucose: 105.41 mg/dL

## 2022-05-25 LAB — TYPE AND SCREEN
ABO/RH(D): A POS
Antibody Screen: NEGATIVE
Extend sample reason: TRANSFUSED

## 2022-05-25 LAB — SURGICAL PCR SCREEN
MRSA, PCR: NEGATIVE
Staphylococcus aureus: NEGATIVE

## 2022-05-25 LAB — SEDIMENTATION RATE: Sed Rate: 74 mm/hr — ABNORMAL HIGH (ref 0–20)

## 2022-05-25 NOTE — Patient Instructions (Addendum)
Your procedure is scheduled on: 06/01/22 - Wednesday Report to the Registration Desk on the 1st floor of the [facility]. To find out your arrival time, please call [phone] between 1PM - 3PM on: 05/31/22 - Tuesday If your arrival time is 6:00 am, do not arrive before that time as the [facility] entrance doors do not open until 6:00 am.  REMEMBER: Instructions that are not followed completely may result in serious medical risk, up to and including death; or upon the discretion of your surgeon and anesthesiologist your surgery may need to be rescheduled.  Do not eat food after midnight the night before surgery.  No gum chewing or hard candies.  You may drink water up to 2 hours before you are scheduled to arrive for your surgery. Do not drink anything within 2 hours of your scheduled arrival time..  In addition, your doctor has ordered for you to drink the provided:  Gatorade G2 Drinking this carbohydrate drink up to two hours before surgery helps to reduce insulin resistance and improve patient outcomes. Please complete drinking 2 hours before scheduled arrival time.  One week prior to surgery: Stop Anti-inflammatories (NSAIDS) such as Advil, Aleve, Ibuprofen, Motrin, Naproxen, Naprosyn and Aspirin based products such as Excedrin, Goody's Powder, BC Powder.  Stop ANY OVER THE COUNTER supplements until after surgery.  You may however, continue to take Tylenol if needed for pain up until the day of surgery.  Continue taking all prescribed medications with the exception of the following:  empagliflozin (JARDIANCE) hold beginning 05/29/22, resume after surgery. metFORMIN (GLUCOPHAGE) hold beginning 05/30/22, resume after surgery. OZEMPIC hold for 7 days prior to your surgery, resume after surgery. tadalafil (CIALIS) hold beginning 05/30/22, resume after surgery.   TAKE ONLY THESE MEDICATIONS THE MORNING OF SURGERY WITH A SIP OF WATER:  pantoprazole (PROTONIX) -(take one the  night before and one on the morning of surgery - helps to prevent nausea after surgery.) pregabalin (LYRICA)     Bring your remote to turn off your Sacral Nerve Stimulator on the day of surgery.   No Alcohol for 24 hours before or after surgery.  No Smoking including e-cigarettes for 24 hours before surgery.  No chewable tobacco products for at least 6 hours before surgery.  No nicotine patches on the day of surgery.  Do not use any "recreational" drugs for at least a week (preferably 2 weeks) before your surgery.  Please be advised that the combination of cocaine and anesthesia may have negative outcomes, up to and including death. If you test positive for cocaine, your surgery will be cancelled.  On the morning of surgery brush your teeth with toothpaste and water, you may rinse your mouth with mouthwash if you wish. Do not swallow any toothpaste or mouthwash.  Use CHG Soap or wipes as directed on instruction sheet.  Do not wear jewelry, make-up, hairpins, clips or nail polish.  Do not wear lotions, powders, or perfumes.   Do not shave body hair from the neck down 48 hours before surgery.  Contact lenses, hearing aids and dentures may not be worn into surgery.  Do not bring valuables to the hospital. [facility] is not responsible for any missing/lost belongings or valuables.   Notify your doctor if there is any change in your medical condition (cold, fever, infection).  Wear comfortable clothing (specific to your surgery type) to the hospital.  After surgery, you can help prevent lung complications by doing breathing exercises.  Take deep breaths and  cough every 1-2 hours. Your doctor may order a device called an Incentive Spirometer to help you take deep breaths. When coughing or sneezing, hold a pillow firmly against your incision with both hands. This is called "splinting." Doing this helps protect your incision. It also decreases belly discomfort.  If you are being  admitted to the hospital overnight, leave your suitcase in the car. After surgery it may be brought to your room.  In case of increased patient census, it may be necessary for you, the patient, to continue your postoperative care in the Same Day Surgery department.  If you are being discharged the day of surgery, you will not be allowed to drive home. You will need a responsible individual to drive you home and stay with you for 24 hours after surgery.   If you are taking public transportation, you will need to have a responsible individual with you.  Please call the Pre-admissions Testing Dept. at [phone] if you have any questions about these instructions.  Surgery Visitation Policy:  Patients having surgery or a procedure may have two visitors.  Children under the age of 62 must have an adult with them who is not the patient.  Inpatient Visitation:    Visiting hours are 7 a.m. to 8 p.m. Up to four visitors are allowed at one time in a patient room. The visitors may rotate out with other people during the day.  One visitor age 77 or older may stay with the patient overnight and must be in the room by 8 p.m.     Pre-operative 5 CHG Bath Instructions   You can play a key role in reducing the risk of infection after surgery. Your skin needs to be as free of germs as possible. You can reduce the number of germs on your skin by washing with CHG (chlorhexidine gluconate) soap before surgery. CHG is an antiseptic soap that kills germs and continues to kill germs even after washing.   DO NOT use if you have an allergy to chlorhexidine/CHG or antibacterial soaps. If your skin becomes reddened or irritated, stop using the CHG and notify one of our RNs at [phone].   Please shower with the CHG soap starting 4 days before surgery using the following schedule:     Please keep in mind the following:  DO NOT shave, including legs and underarms, starting the day of your first shower.    You may shave your face at any point before/day of surgery.  Place clean sheets on your bed the day you start using CHG soap. Use a clean washcloth (not used since being washed) for each shower. DO NOT sleep with pets once you start using the CHG.   CHG Shower Instructions:  If you choose to wash your hair and private area, wash first with your normal shampoo/soap.  After you use shampoo/soap, rinse your hair and body thoroughly to remove shampoo/soap residue.  Turn the water OFF and apply about 3 tablespoons (45 ml) of CHG soap to a CLEAN washcloth.  Apply CHG soap ONLY FROM YOUR NECK DOWN TO YOUR TOES (washing for 3-5 minutes)  DO NOT use CHG soap on face, private areas, open wounds, or sores.  Pay special attention to the area where your surgery is being performed.  If you are having back surgery, having someone wash your back for you may be helpful. Wait 2 minutes after CHG soap is applied, then you may rinse off the CHG soap.  Pat dry  with a clean towel  Put on clean clothes/pajamas   If you choose to wear lotion, please use ONLY the CHG-compatible lotions on the back of this paper.     Additional instructions for the day of surgery: DO NOT APPLY any lotions, deodorants, cologne, or perfumes.   Put on clean/comfortable clothes.  Brush your teeth.  Ask your nurse before applying any prescription medications to the skin.      CHG Compatible Lotions   Aveeno Moisturizing lotion  Cetaphil Moisturizing Cream  Cetaphil Moisturizing Lotion  Clairol Herbal Essence Moisturizing Lotion, Dry Skin  Clairol Herbal Essence Moisturizing Lotion, Extra Dry Skin  Clairol Herbal Essence Moisturizing Lotion, Normal Skin  Curel Age Defying Therapeutic Moisturizing Lotion with Alpha Hydroxy  Curel Extreme Care Body Lotion  Curel Soothing Hands Moisturizing Hand Lotion  Curel Therapeutic Moisturizing Cream, Fragrance-Free  Curel Therapeutic Moisturizing Lotion, Fragrance-Free  Curel  Therapeutic Moisturizing Lotion, Original Formula  Eucerin Daily Replenishing Lotion  Eucerin Dry Skin Therapy Plus Alpha Hydroxy Crme  Eucerin Dry Skin Therapy Plus Alpha Hydroxy Lotion  Eucerin Original Crme  Eucerin Original Lotion  Eucerin Plus Crme Eucerin Plus Lotion  Eucerin TriLipid Replenishing Lotion  Keri Anti-Bacterial Hand Lotion  Keri Deep Conditioning Original Lotion Dry Skin Formula Softly Scented  Keri Deep Conditioning Original Lotion, Fragrance Free Sensitive Skin Formula  Keri Lotion Fast Absorbing Fragrance Free Sensitive Skin Formula  Keri Lotion Fast Absorbing Softly Scented Dry Skin Formula  Keri Original Lotion  Keri Skin Renewal Lotion Keri Silky Smooth Lotion  Keri Silky Smooth Sensitive Skin Lotion  Nivea Body Creamy Conditioning Oil  Nivea Body Extra Enriched Lotion  Nivea Body Original Lotion  Nivea Body Sheer Moisturizing Lotion Nivea Crme  Nivea Skin Firming Lotion  NutraDerm 30 Skin Lotion  NutraDerm Skin Lotion  NutraDerm Therapeutic Skin Cream  NutraDerm Therapeutic Skin Lotion  ProShield Protective Hand Cream  Provon moisturizing lotion  How to Use an Incentive Spirometer  An incentive spirometer is a tool that measures how well you are filling your lungs with each breath. Learning to take long, deep breaths using this tool can help you keep your lungs clear and active. This may help to reverse or lessen your chance of developing breathing (pulmonary) problems, especially infection. You may be asked to use a spirometer: After a surgery. If you have a lung problem or a history of smoking. After a long period of time when you have been unable to move or be active. If the spirometer includes an indicator to show the highest number that you have reached, your health care provider or respiratory therapist will help you set a goal. Keep a log of your progress as told by your health care provider. What are the risks? Breathing too quickly may cause  dizziness or cause you to pass out. Take your time so you do not get dizzy or light-headed. If you are in pain, you may need to take pain medicine before doing incentive spirometry. It is harder to take a deep breath if you are having pain. How to use your incentive spirometer  Sit up on the edge of your bed or on a chair. Hold the incentive spirometer so that it is in an upright position. Before you use the spirometer, breathe out normally. Place the mouthpiece in your mouth. Make sure your lips are closed tightly around it. Breathe in slowly and as deeply as you can through your mouth, causing the piston or the ball to rise toward the  top of the chamber. Hold your breath for 3-5 seconds, or for as long as possible. If the spirometer includes a coach indicator, use this to guide you in breathing. Slow down your breathing if the indicator goes above the marked areas. Remove the mouthpiece from your mouth and breathe out normally. The piston or ball will return to the bottom of the chamber. Rest for a few seconds, then repeat the steps 10 or more times. Take your time and take a few normal breaths between deep breaths so that you do not get dizzy or light-headed. Do this every 1-2 hours when you are awake. If the spirometer includes a goal marker to show the highest number you have reached (best effort), use this as a goal to work toward during each repetition. After each set of 10 deep breaths, cough a few times. This will help to make sure that your lungs are clear. If you have an incision on your chest or abdomen from surgery, place a pillow or a rolled-up towel firmly against the incision when you cough. This can help to reduce pain while taking deep breaths and coughing. General tips When you are able to get out of bed: Walk around often. Continue to take deep breaths and cough in order to clear your lungs. Keep using the incentive spirometer until your health care provider says it is okay  to stop using it. If you have been in the hospital, you may be told to keep using the spirometer at home. Contact a health care provider if: You are having difficulty using the spirometer. You have trouble using the spirometer as often as instructed. Your pain medicine is not giving enough relief for you to use the spirometer as told. You have a fever. Get help right away if: You develop shortness of breath. You develop a cough with bloody mucus from the lungs. You have fluid or blood coming from an incision site after you cough. Summary An incentive spirometer is a tool that can help you learn to take long, deep breaths to keep your lungs clear and active. You may be asked to use a spirometer after a surgery, if you have a lung problem or a history of smoking, or if you have been inactive for a long period of time. Use your incentive spirometer as instructed every 1-2 hours while you are awake. If you have an incision on your chest or abdomen, place a pillow or a rolled-up towel firmly against your incision when you cough. This will help to reduce pain. Get help right away if you have shortness of breath, you cough up bloody mucus, or blood comes from your incision when you cough. This information is not intended to replace advice given to you by your health care provider. Make sure you discuss any questions you have with your health care provider. Document Revised: 03/18/2019 Document Reviewed: 03/18/2019 Elsevier Patient Education  2023 [employer].

## 2022-05-26 LAB — HM DIABETES EYE EXAM

## 2022-05-28 NOTE — Discharge Instructions (Signed)
Instructions after Total Knee Replacement   Erik Martinez Erik Martinez, Jr., M.D.    Dept. of Orthopaedics & Sports Medicine [facility] [address] [place], [place]  [address]  Phone: [phone]   Fax: [phone]       [email or web address]       DIET: Drink plenty of non-alcoholic fluids. Resume your normal diet. Include foods high in fiber.  ACTIVITY:  You may use crutches or a walker with weight-bearing as tolerated, unless instructed otherwise. You may be weaned off of the walker or crutches by your Physical Therapist.  Do NOT place pillows under the knee. Anything placed under the knee could limit your ability to straighten the knee.   Continue doing gentle exercises. Exercising will reduce the pain and swelling, increase motion, and prevent muscle weakness.   Please continue to use the TED compression stockings for 6 weeks. You may remove the stockings at night, but should reapply them in the morning. Do not drive or operate any equipment until instructed.  WOUND CARE:  Continue to use the PolarCare or ice packs periodically to reduce pain and swelling. You may bathe or shower after the staples are removed at the first office visit following surgery.  MEDICATIONS: You may resume your regular medications. Please take the pain medication as prescribed on the medication. Do not take pain medication on an empty stomach. You have been given a prescription for a blood thinner (Lovenox or Coumadin). Please take the medication as instructed. (NOTE: After completing a 2 week course of Lovenox, take one Enteric-coated aspirin once a day. This along with elevation will help reduce the possibility of phlebitis in your operated leg.) Do not drive or drink alcoholic beverages when taking pain medications.  CALL THE OFFICE FOR: Temperature above 101 degrees Excessive bleeding or drainage on the dressing. Excessive swelling, coldness, or paleness of the toes. Persistent nausea and  vomiting.  FOLLOW-UP:  You should have an appointment to return to the office in 10-14 days after surgery. Arrangements have been made for continuation of Physical Therapy (either home therapy or outpatient therapy).     [facility] Department Directory         [email or web address]       [email or web address]          Cardiology  Appointments: [place] - [phone] [place] - [phone]  Endocrinology  Appointments: [place] - [phone] [place] - [phone]  Gastroenterology  Appointments: [place] - [phone] [place] - [phone]        General Surgery   Appointments: [place] - [phone]  Internal Medicine/Family Medicine  Appointments: [place] - [phone] [place] - [phone] [place] - [phone]  Metabolic and Weigh Loss Surgery  Appointments: [place] - [phone]        Neurology  Appointments: [place] - [phone] [place] - [phone]  Neurosurgery  Appointments: [place] - [phone]  Obstetrics & Gynecology  Appointments: [place] - [phone] [place] - [phone]        Pediatrics  Appointments: [place] - [phone] [place] - [phone]  Physiatry  Appointments: [place] -[phone]  Physical Therapy  Appointments: [place] - [phone] [place] - [phone]        Podiatry  Appointments: [place] - [phone] [place] - [phone]  Pulmonology  Appointments: [place] - [phone]  Rheumatology  Appointments: [place] - [phone]        [place] Location: [facility]  [address] , [place]  [address]  [place] Location: [facility] [address] [place], [place]  [address]  [place] Location: [facility] [address] [place], [place]  [address]    

## 2022-05-31 MED ORDER — ORAL CARE MOUTH RINSE: 15.0000 mL | Freq: Once | OROMUCOSAL | Status: AC

## 2022-05-31 MED ORDER — CHLORHEXIDINE GLUCONATE 4 % EX SOLN: 60.0000 mL | Freq: Once | CUTANEOUS | Status: DC

## 2022-05-31 MED ORDER — CEFAZOLIN SODIUM-DEXTROSE 2-4 GM/100ML-% IV SOLN
2.0000 g | INTRAVENOUS | Status: AC
  Administered 2022-06-01 (×2): 2 g via INTRAVENOUS

## 2022-05-31 MED ORDER — CHLORHEXIDINE GLUCONATE 0.12 % MT SOLN
15.0000 mL | Freq: Once | OROMUCOSAL | Status: AC
  Administered 2022-06-01: 15 mL via OROMUCOSAL

## 2022-05-31 MED ORDER — GABAPENTIN 300 MG PO CAPS
300.0000 mg | ORAL_CAPSULE | Freq: Once | ORAL | Status: AC
  Administered 2022-06-01: 300 mg via ORAL

## 2022-05-31 MED ORDER — DEXAMETHASONE SODIUM PHOSPHATE 10 MG/ML IJ SOLN
8.0000 mg | Freq: Once | INTRAMUSCULAR | Status: AC
  Administered 2022-06-01: 8 mg via INTRAVENOUS

## 2022-05-31 MED ORDER — SODIUM CHLORIDE 0.9 % IV SOLN: INTRAVENOUS | Status: DC

## 2022-05-31 MED ORDER — CELECOXIB 200 MG PO CAPS
400.0000 mg | ORAL_CAPSULE | Freq: Once | ORAL | Status: AC
  Administered 2022-06-01: 400 mg via ORAL

## 2022-05-31 MED ORDER — TRANEXAMIC ACID-NACL 1000-0.7 MG/100ML-% IV SOLN
1000.0000 mg | INTRAVENOUS | Status: AC
  Administered 2022-06-01 (×2): 1000 mg via INTRAVENOUS

## 2022-06-01 ENCOUNTER — Other Ambulatory Visit: Payer: Self-pay

## 2022-06-01 ENCOUNTER — Inpatient Hospital Stay: Payer: PPO | Admitting: Anesthesiology

## 2022-06-01 ENCOUNTER — Inpatient Hospital Stay: Payer: PPO

## 2022-06-01 ENCOUNTER — Inpatient Hospital Stay: Payer: PPO | Admitting: Urgent Care

## 2022-06-01 ENCOUNTER — Encounter: Payer: Self-pay | Admitting: Orthopedic Surgery

## 2022-06-01 ENCOUNTER — Inpatient Hospital Stay
Admission: RE | Admit: 2022-06-01 | Discharge: 2022-06-03 | DRG: 468 | Disposition: A | Discharge status: Home Health | Payer: PPO | Attending: Orthopedic Surgery | Admitting: Orthopedic Surgery

## 2022-06-01 ENCOUNTER — Encounter
Admission: RE | Disposition: A | Discharge status: Home Health | Payer: Self-pay | Source: Home / Self Care | Attending: Orthopedic Surgery

## 2022-06-01 DIAGNOSIS — Z809 Family history of malignant neoplasm, unspecified: Secondary | ICD-10-CM | POA: Diagnosis not present

## 2022-06-01 DIAGNOSIS — Z79899 Other long term (current) drug therapy: Secondary | ICD-10-CM | POA: Diagnosis not present

## 2022-06-01 DIAGNOSIS — Z8673 Personal history of transient ischemic attack (TIA), and cerebral infarction without residual deficits: Secondary | ICD-10-CM | POA: Diagnosis not present

## 2022-06-01 DIAGNOSIS — Z8262 Family history of osteoporosis: Secondary | ICD-10-CM

## 2022-06-01 DIAGNOSIS — Z96652 Presence of left artificial knee joint: Secondary | ICD-10-CM | POA: Diagnosis not present

## 2022-06-01 DIAGNOSIS — M009 Pyogenic arthritis, unspecified: Secondary | ICD-10-CM

## 2022-06-01 DIAGNOSIS — E7800 Pure hypercholesterolemia, unspecified: Secondary | ICD-10-CM | POA: Diagnosis present

## 2022-06-01 DIAGNOSIS — Z8619 Personal history of other infectious and parasitic diseases: Secondary | ICD-10-CM

## 2022-06-01 DIAGNOSIS — G8918 Other acute postprocedural pain: Secondary | ICD-10-CM | POA: Diagnosis not present

## 2022-06-01 DIAGNOSIS — K219 Gastro-esophageal reflux disease without esophagitis: Secondary | ICD-10-CM | POA: Diagnosis not present

## 2022-06-01 DIAGNOSIS — E1142 Type 2 diabetes mellitus with diabetic polyneuropathy: Secondary | ICD-10-CM

## 2022-06-01 DIAGNOSIS — Z885 Allergy status to narcotic agent status: Secondary | ICD-10-CM | POA: Diagnosis not present

## 2022-06-01 DIAGNOSIS — E1151 Type 2 diabetes mellitus with diabetic peripheral angiopathy without gangrene: Secondary | ICD-10-CM | POA: Diagnosis not present

## 2022-06-01 DIAGNOSIS — G894 Chronic pain syndrome: Secondary | ICD-10-CM | POA: Diagnosis not present

## 2022-06-01 DIAGNOSIS — Z96659 Presence of unspecified artificial knee joint: Secondary | ICD-10-CM | POA: Diagnosis present

## 2022-06-01 DIAGNOSIS — N319 Neuromuscular dysfunction of bladder, unspecified: Secondary | ICD-10-CM | POA: Diagnosis not present

## 2022-06-01 DIAGNOSIS — Z7982 Long term (current) use of aspirin: Secondary | ICD-10-CM

## 2022-06-01 DIAGNOSIS — Z91048 Other nonmedicinal substance allergy status: Secondary | ICD-10-CM | POA: Diagnosis not present

## 2022-06-01 DIAGNOSIS — G4700 Insomnia, unspecified: Secondary | ICD-10-CM | POA: Diagnosis present

## 2022-06-01 DIAGNOSIS — Z7984 Long term (current) use of oral hypoglycemic drugs: Secondary | ICD-10-CM | POA: Diagnosis not present

## 2022-06-01 DIAGNOSIS — Z791 Long term (current) use of non-steroidal anti-inflammatories (NSAID): Secondary | ICD-10-CM

## 2022-06-01 DIAGNOSIS — Z833 Family history of diabetes mellitus: Secondary | ICD-10-CM

## 2022-06-01 DIAGNOSIS — Z96642 Presence of left artificial hip joint: Secondary | ICD-10-CM | POA: Diagnosis present

## 2022-06-01 DIAGNOSIS — J449 Chronic obstructive pulmonary disease, unspecified: Secondary | ICD-10-CM | POA: Diagnosis present

## 2022-06-01 DIAGNOSIS — T8454XA Infection and inflammatory reaction due to internal left knee prosthesis, initial encounter: Secondary | ICD-10-CM | POA: Diagnosis not present

## 2022-06-01 DIAGNOSIS — T8459XD Infection and inflammatory reaction due to other internal joint prosthesis, subsequent encounter: Secondary | ICD-10-CM | POA: Diagnosis not present

## 2022-06-01 DIAGNOSIS — T8459XA Infection and inflammatory reaction due to other internal joint prosthesis, initial encounter: Secondary | ICD-10-CM | POA: Diagnosis not present

## 2022-06-01 DIAGNOSIS — Z888 Allergy status to other drugs, medicaments and biological substances status: Secondary | ICD-10-CM | POA: Diagnosis not present

## 2022-06-01 DIAGNOSIS — I2510 Atherosclerotic heart disease of native coronary artery without angina pectoris: Secondary | ICD-10-CM | POA: Diagnosis present

## 2022-06-01 DIAGNOSIS — Z01812 Encounter for preprocedural laboratory examination: Secondary | ICD-10-CM

## 2022-06-01 DIAGNOSIS — Z7985 Long-term (current) use of injectable non-insulin antidiabetic drugs: Secondary | ICD-10-CM | POA: Diagnosis not present

## 2022-06-01 DIAGNOSIS — Y831 Surgical operation with implant of artificial internal device as the cause of abnormal reaction of the patient, or of later complication, without mention of misadventure at the time of the procedure: Secondary | ICD-10-CM | POA: Diagnosis present

## 2022-06-01 DIAGNOSIS — Z471 Aftercare following joint replacement surgery: Secondary | ICD-10-CM | POA: Diagnosis not present

## 2022-06-01 HISTORY — PX: TOTAL KNEE REVISION: SHX996

## 2022-06-01 HISTORY — PX: IRRIGATION AND DEBRIDEMENT KNEE: SHX5185

## 2022-06-01 LAB — GLUCOSE, CAPILLARY
Glucose-Capillary: 145 mg/dL — ABNORMAL HIGH (ref 70–99)
Glucose-Capillary: 146 mg/dL — ABNORMAL HIGH (ref 70–99)
Glucose-Capillary: 146 mg/dL — ABNORMAL HIGH (ref 70–99)
Glucose-Capillary: 172 mg/dL — ABNORMAL HIGH (ref 70–99)

## 2022-06-01 LAB — BODY FLUID CELL COUNT WITH DIFFERENTIAL
Lymphs, Fluid: 13 %
Monocyte-Macrophage-Serous Fluid: 5 %
Neutrophil Count, Fluid: 82 %
Total Nucleated Cell Count, Fluid: 2163 cu mm

## 2022-06-01 LAB — AEROBIC/ANAEROBIC CULTURE W GRAM STAIN (SURGICAL/DEEP WOUND)

## 2022-06-01 LAB — TYPE AND SCREEN
ABO/RH(D): A POS
Antibody Screen: NEGATIVE

## 2022-06-01 SURGERY — TOTAL KNEE REVISION
Anesthesia: General | Site: Knee | Laterality: Left

## 2022-06-01 MED ORDER — INSULIN ASPART 100 UNIT/ML IJ SOLN
0.0000 [IU] | Freq: Three times a day (TID) | INTRAMUSCULAR | Status: DC
  Administered 2022-06-02: 3 [IU] via SUBCUTANEOUS
  Administered 2022-06-02 (×2): 2 [IU] via SUBCUTANEOUS

## 2022-06-01 MED ORDER — ACETAMINOPHEN 10 MG/ML IV SOLN
1000.0000 mg | Freq: Four times a day (QID) | INTRAVENOUS | Status: AC
  Administered 2022-06-01 – 2022-06-02 (×4): 1000 mg via INTRAVENOUS

## 2022-06-01 MED ORDER — MIDAZOLAM HCL 2 MG/2ML IJ SOLN
INTRAMUSCULAR | Status: AC
  Filled 2022-06-01: qty 2

## 2022-06-01 MED ORDER — CEFAZOLIN SODIUM-DEXTROSE 2-4 GM/100ML-% IV SOLN
INTRAVENOUS | Status: AC
  Filled 2022-06-01: qty 100

## 2022-06-01 MED ORDER — OXYCODONE HCL 5 MG PO TABS
5.0000 mg | ORAL_TABLET | Freq: Once | ORAL | Status: AC | PRN
  Administered 2022-06-01: 5 mg via ORAL

## 2022-06-01 MED ORDER — SCOPOLAMINE 1 MG/3DAYS TD PT72: 1.0000 | MEDICATED_PATCH | TRANSDERMAL | Status: DC

## 2022-06-01 MED ORDER — FENTANYL CITRATE (PF) 100 MCG/2ML IJ SOLN
25.0000 ug | INTRAMUSCULAR | Status: DC | PRN
  Administered 2022-06-01 (×3): 50 ug via INTRAVENOUS

## 2022-06-01 MED ORDER — ACETAMINOPHEN 10 MG/ML IV SOLN: 1000.0000 mg | Freq: Once | INTRAVENOUS | Status: DC | PRN

## 2022-06-01 MED ORDER — OXYCODONE HCL 5 MG PO TABS
20.0000 mg | ORAL_TABLET | Freq: Four times a day (QID) | ORAL | Status: DC | PRN
  Administered 2022-06-01 – 2022-06-03 (×7): 20 mg via ORAL

## 2022-06-01 MED ORDER — ROCURONIUM BROMIDE 100 MG/10ML IV SOLN
INTRAVENOUS | Status: DC | PRN
  Administered 2022-06-01: 30 mg via INTRAVENOUS
  Administered 2022-06-01: 60 mg via INTRAVENOUS
  Administered 2022-06-01: 10 mg via INTRAVENOUS

## 2022-06-01 MED ORDER — EPINEPHRINE PF 1 MG/ML IJ SOLN
INTRAMUSCULAR | Status: AC
  Filled 2022-06-01: qty 1

## 2022-06-01 MED ORDER — OXYCODONE HCL 5 MG PO TABS: 5.0000 mg | ORAL_TABLET | ORAL | Status: DC | PRN

## 2022-06-01 MED ORDER — TETRACAINE HCL 1 % IJ SOLN
INTRAMUSCULAR | Status: AC
  Filled 2022-06-01: qty 2

## 2022-06-01 MED ORDER — BUPIVACAINE LIPOSOME 1.3 % IJ SUSP
INTRAMUSCULAR | Status: DC | PRN
  Administered 2022-06-01: 10 mL

## 2022-06-01 MED ORDER — HYDROMORPHONE HCL 1 MG/ML IJ SOLN
INTRAMUSCULAR | Status: DC | PRN
  Administered 2022-06-01 (×4): .5 mg via INTRAVENOUS

## 2022-06-01 MED ORDER — LIDOCAINE HCL (CARDIAC) PF 100 MG/5ML IV SOSY
PREFILLED_SYRINGE | INTRAVENOUS | Status: DC | PRN
  Administered 2022-06-01: 100 mg via INTRAVENOUS

## 2022-06-01 MED ORDER — MINERAL OIL LIGHT OIL
TOPICAL_OIL | Status: AC
  Filled 2022-06-01: qty 10

## 2022-06-01 MED ORDER — PROPOFOL 10 MG/ML IV BOLUS
INTRAVENOUS | Status: DC | PRN
  Administered 2022-06-01: 200 mg via INTRAVENOUS

## 2022-06-01 MED ORDER — ACETAMINOPHEN 10 MG/ML IV SOLN
INTRAVENOUS | Status: AC
  Filled 2022-06-01: qty 100

## 2022-06-01 MED ORDER — PHENYLEPHRINE HCL-NACL 20-0.9 MG/250ML-% IV SOLN
INTRAVENOUS | Status: DC | PRN
  Administered 2022-06-01: 30 ug/min via INTRAVENOUS

## 2022-06-01 MED ORDER — ATORVASTATIN CALCIUM 20 MG PO TABS
20.0000 mg | ORAL_TABLET | Freq: Every day | ORAL | Status: DC
  Administered 2022-06-01 – 2022-06-02 (×2): 20 mg via ORAL

## 2022-06-01 MED ORDER — ENOXAPARIN SODIUM 30 MG/0.3ML IJ SOSY
30.0000 mg | PREFILLED_SYRINGE | Freq: Two times a day (BID) | INTRAMUSCULAR | Status: DC
  Administered 2022-06-02 (×2): 30 mg via SUBCUTANEOUS

## 2022-06-01 MED ORDER — HYDROMORPHONE HCL 1 MG/ML IJ SOLN: 0.5000 mg | INTRAMUSCULAR | Status: DC | PRN

## 2022-06-01 MED ORDER — MIDAZOLAM HCL 2 MG/2ML IJ SOLN
INTRAMUSCULAR | Status: DC | PRN
  Administered 2022-06-01 (×2): 2 mg via INTRAVENOUS

## 2022-06-01 MED ORDER — DIPHENHYDRAMINE HCL 12.5 MG/5ML PO ELIX: 12.5000 mg | ORAL_SOLUTION | ORAL | Status: DC | PRN

## 2022-06-01 MED ORDER — ONDANSETRON HCL 4 MG/2ML IJ SOLN
INTRAMUSCULAR | Status: AC
  Filled 2022-06-01: qty 2

## 2022-06-01 MED ORDER — FENTANYL CITRATE (PF) 100 MCG/2ML IJ SOLN
INTRAMUSCULAR | Status: AC
  Filled 2022-06-01: qty 2

## 2022-06-01 MED ORDER — HYDROGEN PEROXIDE 3 % EX SOLN
CUTANEOUS | Status: DC | PRN
  Administered 2022-06-01: 1

## 2022-06-01 MED ORDER — ONDANSETRON HCL 4 MG/2ML IJ SOLN: 4.0000 mg | Freq: Four times a day (QID) | INTRAMUSCULAR | Status: DC | PRN

## 2022-06-01 MED ORDER — SUGAMMADEX SODIUM 200 MG/2ML IV SOLN
INTRAVENOUS | Status: DC | PRN
  Administered 2022-06-01: 200 mg via INTRAVENOUS

## 2022-06-01 MED ORDER — GABAPENTIN 300 MG PO CAPS
ORAL_CAPSULE | ORAL | Status: AC
  Filled 2022-06-01: qty 1

## 2022-06-01 MED ORDER — METHYLENE BLUE 1 % INJ SOLN
INTRAVENOUS | Status: AC
  Filled 2022-06-01: qty 10

## 2022-06-01 MED ORDER — PROPOFOL 1000 MG/100ML IV EMUL
INTRAVENOUS | Status: AC
  Filled 2022-06-01: qty 100

## 2022-06-01 MED ORDER — HYDROMORPHONE HCL 1 MG/ML IJ SOLN
INTRAMUSCULAR | Status: AC
  Filled 2022-06-01: qty 1

## 2022-06-01 MED ORDER — 0.9 % SODIUM CHLORIDE (POUR BTL) OPTIME
TOPICAL | Status: DC | PRN
  Administered 2022-06-01: 1000 mL

## 2022-06-01 MED ORDER — CELECOXIB 200 MG PO CAPS
200.0000 mg | ORAL_CAPSULE | Freq: Two times a day (BID) | ORAL | Status: DC
  Administered 2022-06-01 – 2022-06-03 (×4): 200 mg via ORAL

## 2022-06-01 MED ORDER — ROCURONIUM BROMIDE 10 MG/ML (PF) SYRINGE
PREFILLED_SYRINGE | INTRAVENOUS | Status: AC
  Filled 2022-06-01: qty 10

## 2022-06-01 MED ORDER — ONDANSETRON HCL 4 MG/2ML IJ SOLN
4.0000 mg | Freq: Once | INTRAMUSCULAR | Status: AC | PRN
  Administered 2022-06-01: 4 mg via INTRAVENOUS

## 2022-06-01 MED ORDER — SODIUM CHLORIDE 0.9 % IV SOLN: INTRAVENOUS | Status: DC

## 2022-06-01 MED ORDER — FLEET ENEMA 7-19 GM/118ML RE ENEM: 1.0000 | ENEMA | Freq: Once | RECTAL | Status: DC | PRN

## 2022-06-01 MED ORDER — ZOLPIDEM TARTRATE 5 MG PO TABS
ORAL_TABLET | ORAL | Status: AC
  Filled 2022-06-01: qty 1

## 2022-06-01 MED ORDER — EMPAGLIFLOZIN 25 MG PO TABS
25.0000 mg | ORAL_TABLET | Freq: Every day | ORAL | Status: DC
  Administered 2022-06-01 – 2022-06-03 (×3): 25 mg via ORAL
  Filled 2022-06-01 (×3): qty 1

## 2022-06-01 MED ORDER — KETAMINE HCL 50 MG/ML IJ SOLN
INTRAMUSCULAR | Status: DC | PRN
  Administered 2022-06-01 (×3): 10 mg via INTRAVENOUS
  Administered 2022-06-01: 20 mg via INTRAVENOUS

## 2022-06-01 MED ORDER — CYCLOBENZAPRINE HCL 10 MG PO TABS
10.0000 mg | ORAL_TABLET | Freq: Three times a day (TID) | ORAL | Status: DC
  Administered 2022-06-01 – 2022-06-03 (×5): 10 mg via ORAL
  Filled 2022-06-01 (×5): qty 1

## 2022-06-01 MED ORDER — LIDOCAINE HCL (PF) 2 % IJ SOLN
INTRAMUSCULAR | Status: AC
  Filled 2022-06-01: qty 5

## 2022-06-01 MED ORDER — ACETAMINOPHEN 325 MG PO TABS: 325.0000 mg | ORAL_TABLET | Freq: Four times a day (QID) | ORAL | Status: DC | PRN

## 2022-06-01 MED ORDER — INSULIN ASPART 100 UNIT/ML IJ SOLN: 0.0000 [IU] | Freq: Every day | INTRAMUSCULAR | Status: DC

## 2022-06-01 MED ORDER — TRAMADOL HCL 50 MG PO TABS: 50.0000 mg | ORAL_TABLET | ORAL | Status: DC | PRN

## 2022-06-01 MED ORDER — SODIUM CHLORIDE 0.9 % IR SOLN
Status: DC | PRN
  Administered 2022-06-01 (×5): 3000 mL

## 2022-06-01 MED ORDER — FERROUS SULFATE 325 (65 FE) MG PO TABS
325.0000 mg | ORAL_TABLET | Freq: Two times a day (BID) | ORAL | Status: DC
  Administered 2022-06-02 – 2022-06-03 (×3): 325 mg via ORAL

## 2022-06-01 MED ORDER — PHENYLEPHRINE HCL (PRESSORS) 10 MG/ML IV SOLN
INTRAVENOUS | Status: DC | PRN
  Administered 2022-06-01: 160 ug via INTRAVENOUS
  Administered 2022-06-01: 80 ug via INTRAVENOUS
  Administered 2022-06-01 (×2): 160 ug via INTRAVENOUS
  Administered 2022-06-01: 80 ug via INTRAVENOUS
  Administered 2022-06-01: 120 ug via INTRAVENOUS
  Administered 2022-06-01: 80 ug via INTRAVENOUS

## 2022-06-01 MED ORDER — GLYCOPYRROLATE 0.2 MG/ML IJ SOLN
INTRAMUSCULAR | Status: DC | PRN
  Administered 2022-06-01: .2 mg via INTRAVENOUS

## 2022-06-01 MED ORDER — BISACODYL 10 MG RE SUPP: 10.0000 mg | Freq: Every day | RECTAL | Status: DC | PRN

## 2022-06-01 MED ORDER — KETAMINE HCL 50 MG/5ML IJ SOSY
PREFILLED_SYRINGE | INTRAMUSCULAR | Status: AC
  Filled 2022-06-01: qty 5

## 2022-06-01 MED ORDER — CELECOXIB 200 MG PO CAPS
ORAL_CAPSULE | ORAL | Status: AC
  Filled 2022-06-01: qty 2

## 2022-06-01 MED ORDER — BUPIVACAINE HCL (PF) 0.5 % IJ SOLN
INTRAMUSCULAR | Status: AC
  Filled 2022-06-01: qty 10

## 2022-06-01 MED ORDER — CEFAZOLIN SODIUM 1 G IJ SOLR
INTRAMUSCULAR | Status: AC
  Filled 2022-06-01: qty 10

## 2022-06-01 MED ORDER — ACETAMINOPHEN 10 MG/ML IV SOLN
INTRAVENOUS | Status: DC | PRN
  Administered 2022-06-01: 1000 mg via INTRAVENOUS

## 2022-06-01 MED ORDER — OXYCODONE HCL 5 MG PO TABS
ORAL_TABLET | ORAL | Status: AC
  Filled 2022-06-01: qty 2

## 2022-06-01 MED ORDER — OXYCODONE HCL 5 MG PO TABS
ORAL_TABLET | ORAL | Status: AC
  Filled 2022-06-01: qty 1

## 2022-06-01 MED ORDER — METOCLOPRAMIDE HCL 10 MG PO TABS
10.0000 mg | ORAL_TABLET | Freq: Three times a day (TID) | ORAL | Status: DC
  Administered 2022-06-01 – 2022-06-03 (×6): 10 mg via ORAL

## 2022-06-01 MED ORDER — DEXMEDETOMIDINE HCL IN NACL 80 MCG/20ML IV SOLN
INTRAVENOUS | Status: AC
  Filled 2022-06-01: qty 20

## 2022-06-01 MED ORDER — DEXAMETHASONE SODIUM PHOSPHATE 10 MG/ML IJ SOLN
INTRAMUSCULAR | Status: AC
  Filled 2022-06-01: qty 1

## 2022-06-01 MED ORDER — MAGNESIUM HYDROXIDE 400 MG/5ML PO SUSP
30.0000 mL | Freq: Every day | ORAL | Status: DC
  Administered 2022-06-02 – 2022-06-03 (×2): 30 mL via ORAL

## 2022-06-01 MED ORDER — LACTATED RINGERS IV SOLN: INTRAVENOUS | Status: DC

## 2022-06-01 MED ORDER — PANTOPRAZOLE SODIUM 40 MG PO TBEC
DELAYED_RELEASE_TABLET | ORAL | Status: AC
  Filled 2022-06-01: qty 1

## 2022-06-01 MED ORDER — PANTOPRAZOLE SODIUM 40 MG PO TBEC
40.0000 mg | DELAYED_RELEASE_TABLET | Freq: Two times a day (BID) | ORAL | Status: DC
  Administered 2022-06-01 – 2022-06-03 (×4): 40 mg via ORAL

## 2022-06-01 MED ORDER — CHLORHEXIDINE GLUCONATE 0.12 % MT SOLN
OROMUCOSAL | Status: AC
  Filled 2022-06-01: qty 15

## 2022-06-01 MED ORDER — ONDANSETRON HCL 4 MG PO TABS: 4.0000 mg | ORAL_TABLET | Freq: Four times a day (QID) | ORAL | Status: DC | PRN

## 2022-06-01 MED ORDER — BUPIVACAINE HCL (PF) 0.5 % IJ SOLN
INTRAMUSCULAR | Status: DC | PRN
  Administered 2022-06-01: 10 mL

## 2022-06-01 MED ORDER — ZOLPIDEM TARTRATE 5 MG PO TABS
5.0000 mg | ORAL_TABLET | Freq: Every evening | ORAL | Status: DC | PRN
  Administered 2022-06-01: 5 mg via ORAL

## 2022-06-01 MED ORDER — METFORMIN HCL 500 MG PO TABS
1000.0000 mg | ORAL_TABLET | Freq: Two times a day (BID) | ORAL | Status: DC
  Administered 2022-06-03: 1000 mg via ORAL

## 2022-06-01 MED ORDER — GLYCOPYRROLATE 0.2 MG/ML IJ SOLN
INTRAMUSCULAR | Status: AC
  Filled 2022-06-01: qty 1

## 2022-06-01 MED ORDER — TRANEXAMIC ACID-NACL 1000-0.7 MG/100ML-% IV SOLN: 1000.0000 mg | Freq: Once | INTRAVENOUS | Status: DC

## 2022-06-01 MED ORDER — PREGABALIN 50 MG PO CAPS
200.0000 mg | ORAL_CAPSULE | Freq: Three times a day (TID) | ORAL | Status: DC
  Administered 2022-06-01 – 2022-06-03 (×5): 200 mg via ORAL

## 2022-06-01 MED ORDER — SURGIPHOR WOUND IRRIGATION SYSTEM - OPTIME: TOPICAL | Status: DC | PRN

## 2022-06-01 MED ORDER — FENTANYL CITRATE (PF) 100 MCG/2ML IJ SOLN
INTRAMUSCULAR | Status: DC | PRN
  Administered 2022-06-01: 25 ug via INTRAVENOUS
  Administered 2022-06-01: 50 ug via INTRAVENOUS
  Administered 2022-06-01: 25 ug via INTRAVENOUS
  Administered 2022-06-01 (×2): 50 ug via INTRAVENOUS
  Administered 2022-06-01: 100 ug via INTRAVENOUS
  Administered 2022-06-01 (×2): 50 ug via INTRAVENOUS

## 2022-06-01 MED ORDER — SODIUM CHLORIDE 0.9 % IR SOLN: Status: DC | PRN

## 2022-06-01 MED ORDER — OXYCODONE HCL 5 MG/5ML PO SOLN: 5.0000 mg | Freq: Once | ORAL | Status: AC | PRN

## 2022-06-01 MED ORDER — TRANEXAMIC ACID-NACL 1000-0.7 MG/100ML-% IV SOLN
INTRAVENOUS | Status: AC
  Filled 2022-06-01: qty 100

## 2022-06-01 MED ORDER — BUPIVACAINE LIPOSOME 1.3 % IJ SUSP
INTRAMUSCULAR | Status: AC
  Filled 2022-06-01: qty 10

## 2022-06-01 MED ORDER — ATORVASTATIN CALCIUM 20 MG PO TABS
ORAL_TABLET | ORAL | Status: AC
  Filled 2022-06-01: qty 1

## 2022-06-01 MED ORDER — METOCLOPRAMIDE HCL 10 MG PO TABS
ORAL_TABLET | ORAL | Status: AC
  Filled 2022-06-01: qty 1

## 2022-06-01 MED ORDER — CEFAZOLIN SODIUM-DEXTROSE 2-4 GM/100ML-% IV SOLN
2.0000 g | Freq: Three times a day (TID) | INTRAVENOUS | Status: AC
  Administered 2022-06-01 – 2022-06-02 (×2): 2 g via INTRAVENOUS

## 2022-06-01 MED ORDER — OXYCODONE HCL 5 MG PO TABS
10.0000 mg | ORAL_TABLET | ORAL | Status: DC | PRN
  Administered 2022-06-02: 10 mg via ORAL

## 2022-06-01 MED ORDER — ONDANSETRON HCL 4 MG/2ML IJ SOLN
INTRAMUSCULAR | Status: DC | PRN
  Administered 2022-06-01: 4 mg via INTRAVENOUS

## 2022-06-01 MED ORDER — DEXMEDETOMIDINE HCL IN NACL 80 MCG/20ML IV SOLN
INTRAVENOUS | Status: DC | PRN
  Administered 2022-06-01: 8 ug via INTRAVENOUS
  Administered 2022-06-01: 12 ug via INTRAVENOUS
  Administered 2022-06-01: 8 ug via INTRAVENOUS
  Administered 2022-06-01: 12 ug via INTRAVENOUS
  Administered 2022-06-01: 8 ug via INTRAVENOUS

## 2022-06-01 MED ORDER — TIZANIDINE HCL 4 MG PO TABS: 4.0000 mg | ORAL_TABLET | Freq: Three times a day (TID) | ORAL | Status: DC | PRN

## 2022-06-01 MED ORDER — CELECOXIB 200 MG PO CAPS
ORAL_CAPSULE | ORAL | Status: AC
  Filled 2022-06-01: qty 1

## 2022-06-01 MED ORDER — FENTANYL CITRATE (PF) 100 MCG/2ML IJ SOLN
50.0000 ug | Freq: Once | INTRAMUSCULAR | Status: AC
  Administered 2022-06-01: 50 ug via INTRAVENOUS

## 2022-06-01 MED ORDER — PREGABALIN 50 MG PO CAPS
ORAL_CAPSULE | ORAL | Status: AC
  Filled 2022-06-01: qty 4

## 2022-06-01 MED ORDER — ALUM & MAG HYDROXIDE-SIMETH 200-200-20 MG/5ML PO SUSP: 30.0000 mL | ORAL | Status: DC | PRN

## 2022-06-01 MED ORDER — TRANEXAMIC ACID 1000 MG/10ML IV SOLN
INTRAVENOUS | Status: AC
  Filled 2022-06-01: qty 10

## 2022-06-01 MED ORDER — SENNOSIDES-DOCUSATE SODIUM 8.6-50 MG PO TABS
ORAL_TABLET | ORAL | Status: AC
  Filled 2022-06-01: qty 1

## 2022-06-01 MED ORDER — SENNOSIDES-DOCUSATE SODIUM 8.6-50 MG PO TABS
1.0000 | ORAL_TABLET | Freq: Two times a day (BID) | ORAL | Status: DC
  Administered 2022-06-01 – 2022-06-03 (×4): 1 via ORAL

## 2022-06-01 MED ORDER — PHENOL 1.4 % MT LIQD: 1.0000 | OROMUCOSAL | Status: DC | PRN

## 2022-06-01 MED ORDER — MENTHOL 3 MG MT LOZG: 1.0000 | LOZENGE | OROMUCOSAL | Status: DC | PRN

## 2022-06-01 MED ORDER — METOPROLOL TARTRATE 5 MG/5ML IV SOLN
INTRAVENOUS | Status: DC | PRN
  Administered 2022-06-01 (×2): 2 mg via INTRAVENOUS
  Administered 2022-06-01: 1 mg via INTRAVENOUS

## 2022-06-01 MED ORDER — NEOMYCIN-POLYMYXIN B GU 40-200000 IR SOLN
Status: AC
  Filled 2022-06-01: qty 20

## 2022-06-01 MED ORDER — HYDROMORPHONE HCL 1 MG/ML IJ SOLN
0.5000 mg | INTRAMUSCULAR | Status: DC | PRN
  Administered 2022-06-01 (×2): 0.5 mg via INTRAVENOUS

## 2022-06-01 SURGICAL SUPPLY — 103 items
ATTUNE MED DOME PAT 41 KNEE (Knees) IMPLANT
ATTUNE PSRP INSR SZ5 5 KNEE (Insert) IMPLANT
AUG FEM SZ5 8 REV POST STRL LF (Joint) ×2 IMPLANT
AUG POST CMT FEM KNEE 5X8 (Joint) ×2 IMPLANT
AUGMENT POST CMT FEM KNEE 5X8 (Joint) IMPLANT
BLADE OSCILLATING/SAGITTAL (BLADE)
BLADE SAGITTAL 25.0X1.19X90 (BLADE) IMPLANT
BLADE SAW 70X12.5 (BLADE) IMPLANT
BLADE SAW 90X13X1.19 OSCILLAT (BLADE) IMPLANT
BLADE SAW 90X25X1.19 OSCILLAT (BLADE) IMPLANT
BLADE SAW SGTL 13X75X1.27 (BLADE) IMPLANT
BLADE SW THK.38XMED LNG THN (BLADE) IMPLANT
BONE CEMENT GENTAMICIN (Cement) ×3 IMPLANT
BRUSH SCRUB EZ PLAIN DRY (MISCELLANEOUS) ×1 IMPLANT
BSPLAT TIB 4 CMNT REV ROT PLAT (Knees) ×1 IMPLANT
CANISTER WOUND CARE 500ML ATS (WOUND CARE) IMPLANT
CEMENT BONE GENTAMICIN 40 (Cement) IMPLANT
CNTNR URN SCR LID CUP LEK RST (MISCELLANEOUS) IMPLANT
COMP FEM ATTUNE CRS SZ5 LT (Femur) ×1 IMPLANT
COMPONENT FEM ATN CRS SZ5 LT (Femur) IMPLANT
CONT SPEC 4OZ STRL OR WHT (MISCELLANEOUS) ×4
COOLER POLAR GLACIER W/PUMP (MISCELLANEOUS) ×1 IMPLANT
COVER BACK TABLE REUSABLE LG (DRAPES) ×1 IMPLANT
CUFF TOURN SGL QUICK 24 (TOURNIQUET CUFF)
CUFF TOURN SGL QUICK 34 (TOURNIQUET CUFF)
CUFF TRNQT CYL 24X4X16.5-23 (TOURNIQUET CUFF) IMPLANT
CUFF TRNQT CYL 34X4.125X (TOURNIQUET CUFF) IMPLANT
DRAPE 3/4 80X56 (DRAPES) ×2 IMPLANT
DRESSING PEEL AND PLAC PRVNA20 (GAUZE/BANDAGES/DRESSINGS) IMPLANT
DRSG AQUACEL AG ADV 3.5X14 (GAUZE/BANDAGES/DRESSINGS) IMPLANT
DRSG MEPILEX SACRM 8.7X9.8 (GAUZE/BANDAGES/DRESSINGS) ×1 IMPLANT
DRSG NON-ADHERENT DERMACEA 3X4 (GAUZE/BANDAGES/DRESSINGS) IMPLANT
DRSG PEEL AND PLACE PREVENA 20 (GAUZE/BANDAGES/DRESSINGS) ×1
DRSG TEGADERM 4X4.75 (GAUZE/BANDAGES/DRESSINGS) IMPLANT
DURAPREP 26ML APPLICATOR (WOUND CARE) ×2 IMPLANT
ELECT CAUTERY BLADE 6.4 (BLADE) ×1 IMPLANT
ELECT REM PT RETURN 9FT ADLT (ELECTROSURGICAL) ×1
ELECTRODE REM PT RTRN 9FT ADLT (ELECTROSURGICAL) ×1 IMPLANT
GAUZE SPONGE 4X4 12PLY STRL (GAUZE/BANDAGES/DRESSINGS) IMPLANT
GLOVE BIOGEL M STRL SZ7.5 (GLOVE) ×6 IMPLANT
GLOVE SRG 8 PF TXTR STRL LF DI (GLOVE) ×2 IMPLANT
GLOVE SURG UNDER POLY LF SZ8 (GLOVE) ×2
GOWN STRL REUS W/ TWL LRG LVL3 (GOWN DISPOSABLE) ×2 IMPLANT
GOWN STRL REUS W/ TWL XL LVL3 (GOWN DISPOSABLE) ×1 IMPLANT
GOWN STRL REUS W/TWL LRG LVL3 (GOWN DISPOSABLE)
GOWN STRL REUS W/TWL XL LVL3 (GOWN DISPOSABLE) ×1
GOWN TOGA ZIPPER T7+ PEEL AWAY (MISCELLANEOUS) ×1 IMPLANT
HANDLE YANKAUER SUCT OPEN TIP (MISCELLANEOUS) ×1 IMPLANT
HANDPIECE VERSAJET DEBRIDEMENT (MISCELLANEOUS) IMPLANT
HEMOVAC 400CC 10FR (MISCELLANEOUS) IMPLANT
HOLDER FOLEY CATH W/STRAP (MISCELLANEOUS) ×1 IMPLANT
HOOD PEEL AWAY T7 (MISCELLANEOUS) ×1 IMPLANT
INSERT TIB CMT ATTUNE RP SZ4 (Knees) IMPLANT
IV NS IRRIG 3000ML ARTHROMATIC (IV SOLUTION) ×4 IMPLANT
KIT TURNOVER KIT A (KITS) ×1 IMPLANT
KNIFE SCULPS 14X20 (INSTRUMENTS) ×1 IMPLANT
MANIFOLD NEPTUNE II (INSTRUMENTS) ×2 IMPLANT
NDL SAFETY ECLIP 18X1.5 (MISCELLANEOUS) ×1 IMPLANT
NDL SPNL 18GX3.5 QUINCKE PK (NEEDLE) IMPLANT
NDL SPNL 20GX3.5 QUINCKE YW (NEEDLE) IMPLANT
NEEDLE SPNL 18GX3.5 QUINCKE PK (NEEDLE) ×1 IMPLANT
NEEDLE SPNL 20GX3.5 QUINCKE YW (NEEDLE) IMPLANT
NS IRRIG 1000ML POUR BTL (IV SOLUTION) ×1 IMPLANT
PACK COLON CLEAN CLOSURE (MISCELLANEOUS) IMPLANT
PACK HIP COMPR (MISCELLANEOUS) IMPLANT
PACK TOTAL KNEE (MISCELLANEOUS) ×1 IMPLANT
PAD ABD DERMACEA PRESS 5X9 (GAUZE/BANDAGES/DRESSINGS) IMPLANT
PAD ARMBOARD 7.5X6 YLW CONV (MISCELLANEOUS) ×3 IMPLANT
PAD WRAPON POLAR KNEE (MISCELLANEOUS) ×1 IMPLANT
PIN FIXATION 1/8DIA X 3INL (PIN) IMPLANT
PULSAVAC PLUS IRRIG FAN TIP (DISPOSABLE) ×1
SLEEVE FEM POROCOAT 30 (Knees) IMPLANT
SLEEVE TIB ATTUNE FP 37 (Knees) IMPLANT
SOL PREP PVP 2OZ (MISCELLANEOUS) ×2
SOLUTION IRRIG SURGIPHOR (IV SOLUTION) ×1 IMPLANT
SOLUTION PREP PVP 2OZ (MISCELLANEOUS) ×1 IMPLANT
SPONGE DRAIN TRACH 4X4 STRL 2S (GAUZE/BANDAGES/DRESSINGS) IMPLANT
SPONGE T-LAP 18X18 ~~LOC~~+RFID (SPONGE) IMPLANT
STAPLER SKIN PROX 35W (STAPLE) IMPLANT
STEM KNEE ATTUNE 12X110MM (Stem) IMPLANT
STEM STR ATTUNE PF 14X110 (Knees) IMPLANT
SUCTION FRAZIER HANDLE 10FR (MISCELLANEOUS) ×1
SUCTION TUBE FRAZIER 10FR DISP (MISCELLANEOUS) ×1 IMPLANT
SUT MNCRL 0 1X36 CT-1 (SUTURE) IMPLANT
SUT MON AB 2-0 CT1 36 (SUTURE) IMPLANT
SUT PDS AB 1 CT  36 (SUTURE) ×2
SUT PDS AB 1 CT 36 (SUTURE) IMPLANT
SUT PROLENE 1 CT (SUTURE) IMPLANT
SUT PROLENE 1 CT 1 30 (SUTURE) IMPLANT
SUT VIC AB 0 CT1 36 (SUTURE) IMPLANT
SUT VIC AB 1 CT1 36 (SUTURE) IMPLANT
SUT VIC AB 2-0 CT1 (SUTURE) ×1 IMPLANT
SWAB CULTURE AMIES ANAERIB BLU (MISCELLANEOUS) IMPLANT
SYR 20ML LL LF (SYRINGE) ×1 IMPLANT
SYR 30ML LL (SYRINGE) IMPLANT
TIP BRUSH PULSAVAC PLUS 24.33 (MISCELLANEOUS) ×1 IMPLANT
TIP FAN IRRIG PULSAVAC PLUS (DISPOSABLE) ×1 IMPLANT
TOWEL OR 17X26 4PK STRL BLUE (TOWEL DISPOSABLE) IMPLANT
TOWER CARTRIDGE SMART MIX (DISPOSABLE) IMPLANT
TRAP FLUID SMOKE EVACUATOR (MISCELLANEOUS) ×1 IMPLANT
TRAY FOLEY MTR SLVR 16FR STAT (SET/KITS/TRAYS/PACK) ×1 IMPLANT
WATER STERILE IRR 1000ML POUR (IV SOLUTION) ×1 IMPLANT
WRAPON POLAR PAD KNEE (MISCELLANEOUS) ×1

## 2022-06-01 NOTE — Anesthesia Procedure Notes (Signed)
Anesthesia Regional Block: Adductor canal block   Pre-Anesthetic Checklist: , timeout performed,  Correct Patient, Correct Site, Correct Laterality,  Correct Procedure, Correct Position, site marked,  Risks and benefits discussed,  Surgical consent,  Pre-op evaluation,  At surgeon's request and post-op pain management  Laterality: Lower and Left  Prep: chloraprep       Needles:  Injection technique: Single-shot  Needle Type: Echogenic Needle     Needle Length: 9cm  Needle Gauge: 21     Additional Needles:   Procedures:,,,, ultrasound used (permanent image in chart),,    Narrative:  Start time: 06/01/2022 4:30 PM End time: 06/01/2022 4:32 PM Injection made incrementally with aspirations every 5 mL.  Performed by: Personally  Anesthesiologist: Stephanie Coup, MD  Additional Notes: Patient's chart reviewed and they were deemed appropriate candidate for procedure, per surgeon's request. Patient educated about risks, benefits, and alternatives of the block including but not limited to: temporary or permanent nerve damage, bleeding, infection, damage to surround tissues, block failure, local anesthetic toxicity. Patient expressed understanding. A formal time-out was conducted consistent with institution rules.  Monitors were applied, and minimal sedation used (see nursing record). The site was prepped with skin prep and allowed to dry, and sterile gloves were used. A high frequency linear ultrasound probe with probe cover was utilized throughout. Femoral artery visualized at mid-thigh level, local anesthetic injected anterolateral to it, and echogenic block needle trajectory was monitored throughout. Hydrodissection of saphenous nerve visualized and appeared anatomically normal. Aspiration performed every 5ml. Blood vessels were avoided. All injections were performed without resistance and free of blood and paresthesias. The patient tolerated the procedure well. A picture of the nerve  block was added to the patient's chart.  Injectate: 10 ml of exparel and 10 ml of 0.5% bupivacaine

## 2022-06-01 NOTE — Transfer of Care (Signed)
Immediate Anesthesia Transfer of Care Note  Patient: Erik Martinez  Procedure(s) Performed: TOTAL KNEE REVISION (Left: Knee) IRRIGATION AND DEBRIDEMENT KNEE (Left: Knee)  Patient Location: PACU  Anesthesia Type:General  Level of Consciousness: awake and alert   Airway & Oxygen Therapy: Patient Spontanous Breathing  Post-op Assessment: Report given to RN and Post -op Vital signs reviewed and stable  Post vital signs: stable  Last Vitals:  Vitals Value Taken Time  BP 153/90 06/01/22 1550  Temp 36.2 C 06/01/22 1548  Pulse 107 06/01/22 1552  Resp 10 06/01/22 1552  SpO2 97 % 06/01/22 1552  Vitals shown include unvalidated device data.  Last Pain:  Vitals:   06/01/22 0614  TempSrc: Temporal  PainSc: 0-No pain      Patients Stated Pain Goal: 0 (06/01/22 1610)  Complications: No notable events documented.

## 2022-06-01 NOTE — Anesthesia Preprocedure Evaluation (Addendum)
Anesthesia Evaluation  Patient identified by MRN, date of birth, ID band Patient awake    Reviewed: Allergy & Precautions, NPO status , Patient's Chart, lab work & pertinent test results  History of Anesthesia Complications (+) PONV, POST - OP SPINAL HEADACHE and history of anesthetic complications  Airway Mallampati: III   Neck ROM: Full    Dental  (+) Upper Dentures, Lower Dentures   Pulmonary neg pulmonary ROS   Pulmonary exam normal breath sounds clear to auscultation       Cardiovascular + Peripheral Vascular Disease  Normal cardiovascular exam Rhythm:Regular Rate:Normal  ECG 03/28/22: normal   Neuro/Psych  Headaches PSYCHIATRIC DISORDERS Anxiety Depression Bipolar Disorder   Chronic pain on daily opioids; hx polysubstance use disorder including cocaine, marijuana, none in 5 years    GI/Hepatic ,GERD  ,,(+) Hepatitis -, C  Endo/Other  diabetes, Type 2    Renal/GU      Musculoskeletal  (+) Arthritis ,    Abdominal   Peds  Hematology  (+) Blood dyscrasia, anemia   Anesthesia Other Findings Last dose of Ozempic 05/23/22.  Reproductive/Obstetrics                             Anesthesia Physical Anesthesia Plan  ASA: 3  Anesthesia Plan: General   Post-op Pain Management:    Induction: Intravenous  PONV Risk Score and Plan: 3 and Ondansetron, Dexamethasone and Treatment may vary due to age or medical condition  Airway Management Planned: Oral ETT  Additional Equipment:   Intra-op Plan:   Post-operative Plan: Extubation in OR  Informed Consent: I have reviewed the patients History and Physical, chart, labs and discussed the procedure including the risks, benefits and alternatives for the proposed anesthesia with the patient or authorized representative who has indicated his/her understanding and acceptance.     Dental advisory given  Plan Discussed with: CRNA  Anesthesia  Plan Comments: (Patient does not desire spinal.    Patient consented for risks of anesthesia including but not limited to:  - adverse reactions to medications - damage to eyes, teeth, lips or other oral mucosa - nerve damage due to positioning  - sore throat or hoarseness - damage to heart, brain, nerves, lungs, other parts of body or loss of life  Informed patient about role of CRNA in peri- and intra-operative care.  Patient voiced understanding.)        Anesthesia Quick Evaluation

## 2022-06-01 NOTE — TOC Progression Note (Signed)
Transition of Care [facility]) - Progression Note    Patient Details  Name: Erik Martinez MRN: [id number] Date of Birth: 11-Apr-1955  Transition of [facility]) CM/SW Contact  Marlowe Sax, RN Phone Number: 06/01/2022, 1:37 PM  Clinical Narrative:   The patient is set up with [facility] services prior to admission by surgeons office         Expected Discharge Plan and Services                                               Social Determinants of Health (SDOH) Interventions SDOH Screenings   Food Insecurity: No Food Insecurity (04/01/2022)  Housing: Low Risk  (04/01/2022)  Transportation Needs: No Transportation Needs (04/01/2022)  Utilities: Not At Risk (04/01/2022)  Depression (PHQ2-9): Low Risk  (04/28/2022)  Financial Resource Strain: Low Risk  (01/28/2022)  Physical Activity: Inactive (01/28/2022)  Social Connections: Moderately Isolated (01/28/2022)  Stress: No Stress Concern Present (01/28/2022)  Tobacco Use: Low Risk  (06/01/2022)    Readmission Risk Interventions     No data to display

## 2022-06-01 NOTE — Anesthesia Postprocedure Evaluation (Signed)
Anesthesia Post Note  Patient: Erik Martinez  Procedure(s) Performed: TOTAL KNEE REVISION (Left: Knee) IRRIGATION AND DEBRIDEMENT KNEE (Left: Knee)  Patient location during evaluation: PACU Anesthesia Type: General Level of consciousness: awake and alert Pain management: pain level controlled Vital Signs Assessment: post-procedure vital signs reviewed and stable Respiratory status: spontaneous breathing, nonlabored ventilation, respiratory function stable and patient connected to nasal cannula oxygen Cardiovascular status: blood pressure returned to baseline and stable Postop Assessment: no apparent nausea or vomiting Anesthetic complications: no  There were no known notable events for this encounter.   Last Vitals:  Vitals:   06/01/22 1740 06/01/22 1759  BP: 126/72 129/67  Pulse: 99 98  Resp: 17 16  Temp:  36.6 C  SpO2: 98% 96%    Last Pain:  Vitals:   06/01/22 1759  TempSrc: Oral  PainSc: 6                  Anabelle Bungert      

## 2022-06-01 NOTE — Anesthesia Postprocedure Evaluation (Signed)
Anesthesia Post Note  Patient: GAYLOR RAWLINGS  Procedure(s) Performed: TOTAL KNEE REVISION (Left: Knee) IRRIGATION AND DEBRIDEMENT KNEE (Left: Knee)  Patient location during evaluation: PACU Anesthesia Type: General Level of consciousness: awake and alert Pain management: pain level controlled Vital Signs Assessment: post-procedure vital signs reviewed and stable Respiratory status: spontaneous breathing, nonlabored ventilation, respiratory function stable and patient connected to nasal cannula oxygen Cardiovascular status: blood pressure returned to baseline and stable Postop Assessment: no apparent nausea or vomiting Anesthetic complications: no  There were no known notable events for this encounter.   Last Vitals:  Vitals:   06/01/22 1740 06/01/22 1759  BP: 126/72 129/67  Pulse: 99 98  Resp: 17 16  Temp:  36.6 C  SpO2: 98% 96%    Last Pain:  Vitals:   06/01/22 1759  TempSrc: Oral  PainSc: 6                  Stephanie Coup

## 2022-06-01 NOTE — Plan of Care (Addendum)
The patient arrived to room 22. Handoff received from PACU RN. Patient diaphoretic at time of transfer. The patient "feels warm". Patient's CBG 146. The patient is afebrile. The patient was provided an ice pack and fan for comfort. The patient was oriented to unit call light system. Call bell within reach. Bed in lowest position. Patient's personal crutches, clothing and personal items at bedside. Patient's cell phone within reach. Patient without concerns at this time.   Problem: Coping: Goal: Ability to adjust to condition or change in health will improve Outcome: Progressing   Problem: Fluid Volume: Goal: Ability to maintain a balanced intake and output will improve Outcome: Progressing   Problem: Skin Integrity: Goal: Risk for impaired skin integrity will decrease Outcome: Progressing   Problem: Tissue Perfusion: Goal: Adequacy of tissue perfusion will improve Outcome: Progressing

## 2022-06-01 NOTE — Interval H&P Note (Signed)
History and Physical Interval Note:  06/01/2022 6:23 AM  Erik Martinez  has presented today for surgery, with the diagnosis of Infection of total knee replacement, subsequent encounter T84.59XD, Z96.659 S/P revision of total knee, left Z96.652.  The various methods of treatment have been discussed with the patient and family. After consideration of risks, benefits and other options for treatment, the patient has consented to  Procedure(s): TOTAL KNEE REVISION (Left) as a surgical intervention.  The patient's history has been reviewed, patient examined, no change in status, stable for surgery.  I have reviewed the patient's chart and labs.  Questions were answered to the patient's satisfaction.     Reinhold Rickey P Cheron Coryell

## 2022-06-01 NOTE — Op Note (Signed)
OPERATIVE NOTE  DATE OF SURGERY:  06/01/2022  PATIENT NAME:  Erik Martinez   DOB: June 18, 1955  MRN: [id number]  PRE-OPERATIVE DIAGNOSIS: Status post left knee periprosthetic infection with placement of articulating antibiotic impregnated cement spacer  POST-OPERATIVE DIAGNOSIS:  Same  PROCEDURE: Removal of antibiotic impregnated articulating cement spacer, irrigation and debridement, and left total knee revision arthroplasty  SURGEON:  Jena Gauss. M.D.  ASSISTANT:  Gean Birchwood, PA-C (present and scrubbed throughout the case, critical for assistance with exposure, retraction, instrumentation, and closure)  ANESTHESIA: general  ESTIMATED BLOOD LOSS: 100 mL  FLUIDS REPLACED: 2000 mL of crystalloid  TOURNIQUET TIME: #1 - 120 minutes #2 - 65 minutes  DRAINS: Wound VAC  IMPLANTS UTILIZED: DePuy Attune size revision CRS femoral component, 8 mm medial and lateral posterior femoral augments, size 30 mm fully porous-coated revision femoral sleeve, 14 mm x 110 mm revision press-fit stem, size 4 revision rotating platform tibial base, 37 mm fully porous-coated revision tibial sleeve, 12 mm x 110 mm revision press-fit stem, 41 mm sized dome patella, and a 5 mm rotating platform posterior stabilized polyethylene insert.    INDICATIONS FOR SURGERY: Erik Martinez is a 67 y.o. year old male with a complicated history of multiple knee surgeries.  Approximately 8 weeks ago he underwent removal of total knee arthroplasty implants and placement of antibiotic impregnated cement spacer for a periprosthetic infection.  He completed a 6-week course of IV antibiotics and subsequent knee aspiration demonstrated no growth on cultures.. After discussion of the risks and benefits of surgical intervention, the patient expressed understanding of the risks benefits and agree with plans for removal of the articulating antibiotic impregnated spacers, irrigation debridement, and total knee revision arthroplasty.    The risks, benefits, and alternatives were discussed at length including but not limited to the risks of infection, bleeding, nerve injury, stiffness, blood clots, the need for revision surgery, cardiopulmonary complications, among others, and they were willing to proceed.  PROCEDURE IN DETAIL: The patient was brought into the operating room and, after adequate general anesthesia was achieved, a tourniquet was placed on the patient's upper thigh. The patient's knee and leg were cleaned and prepped with alcohol and DuraPrep and draped in the usual sterile fashion. A "timeout" was performed as per usual protocol. The lower extremity was elevated and the tourniquet was inflated to 300 mmHg. An anterior longitudinal incision was made in line with the previous surgical incision.  Approximately 15 mL of bloody fluid was aspirated from the knee joint and submitted for complete cell count, Gram stain, and cultures.  A medial parapatellar arthrotomy was performed. The deep fibers of the medial collateral ligament were elevated in a subperiosteal fashion off of the medial flare of the tibia so as to maintain a continuous soft tissue sleeve. The patella was subluxed laterally.  Due to limited visualization, it was elected to perform a quadriceps snip so as to better mobilize the patella laterally. The polyethylene insert was removed and attention was directed at removal of the articulating spacer.  The femoral cement component was carefully debrided around its periphery using curettes and rongeurs and was then disengaged from the bone using a bone tamp.  Soft tissue specimens were obtained from the suprapatellar pouch, soft tissue above the patella, and along the posterior recess and submitted for stat Gram stain and cultures.  The femoral canal was debrided using back cutting curettes.  Swab of the femoral canal were obtained and submitted for stat Gram stain  and culture.  Finally, attention was directed at removal of  the tibial cement component.  The component was elevated off of the tibial plateau and removed and mass.  The tibial canal was then debrided using back cutting curettes.  Swabs were obtained of the tibial medullary canal and submitted for Gram stain and cultures.  Tourniquet was deflated after initial tourniquet time of 120 minutes.  The knee was then debrided using the Versajet starting in the medial aspect of the knee and extending into the suprapatellar pouch and down the lateral gutter. Posterior recess was also debrided using the Versajet. Good bleeding tissue was encountered. The knee was irrigated with 3 L of normal saline with antibiotic solution. Next, 100 mL of 3% hydrogen peroxide with 100 mL of water were used to soak the knee for approximately 3 minutes. This was followed by a second irrigation of 3 L using pulsatile lavage. 1 mL of dilute Betadine (22.5 mL of Betadine /L of normal saline)was then used to soak the knee for approximately 3 minutes. This was followed by a another 3 L of normal saline via pulsatile lavage.  450 mL of surgery for was then used to irrigate the knee.  Results from the stat Gram stain's were available and all demonstrated 0-1 white blood cells per high-powered field and no organisms.  It was thus elected to proceed with reimplantation of the total knee implants.  The tibial canal was reamed in sequential fashion up to a 12 mm diameter to allow for a 110 mm stem.  Next, serial broaching was performed up to a 37 mm tibial sleeve broach.  Excellent fit was achieved and good alignment was noted.  A size 4 tibial tray was then positioned and rotation confirmed and locked into the broach.  Attention was then directed to the distal femur.  Distal femur was reamed in sequential fashion up to a 14 mm diameter to allow for a 110 mm stem.  A 30 mm broach was inserted with good fit noted.  There did not appear to be a need for distal augmentation.  The distal femur was sized and was  felt that a size 5 femoral component was appropriate.  Size 5 cutting guide was attached to the broach construct and proper rotation achieved.  It was felt that 8 mm posterior augments were necessary both medially and laterally.  Chamfer cuts were performed.  The box cut was also confirmed using the cutting guide.  The size 5 CRS revision femoral trial with the appropriate posterior augments were attached to the broach/stem construct.a 5 mm polyethylene trial was placed and the knee was brought into full extension.  Good medial lateral stability was appreciated and greater than 100 degrees of flexion was achieved.  Next, attention was directed to the patella.  Thickness of the patella was assessed and was felt that a cleanup cut could be performed.  The patella was cut and prepared so as to accommodate a 41 mm medialized dome patella. A patella trial was placed and the knee was placed through a range of motion with good patellar tracking appreciated.  The knee was again elevated and the tourniquet was inflated to 300 mmHg.  Trial components were removed and the cut surfaces of bone were irrigated with copious muscle normal saline with antibiotic solution and dried.  Tibial and femoral implant constructs were assembled on the back table.  Polymethylmethacrylate cement with gentamicin was prepared in the usual fashion using a vacuum mixer. Cement was applied  to the cut surface of the proximal tibia as well as along the undersurface of a tibial component construct. Tibial component was positioned and impacted into place. Excess cement was removed using Personal assistant. Cement was then applied to the cut surfaces of the femur as well as along the posterior flanges of the femoral component construct. The femoral component was positioned and impacted into place. Excess cement was removed using Personal assistant. A 5 mm polyethylene trial was inserted and the knee was brought into full extension with steady axial  compression applied. Finally, cement was applied to the backside of a 41 mm medialized dome patella and the patellar component was positioned and patellar clamp applied. Excess cement was removed using Personal assistant. After adequate curing of the cement, the tourniquet was deflated after a second tourniquet time of 65 minutes. Hemostasis was achieved using electrocautery. The knee was irrigated with copious amounts of normal saline using pulsatile lavage followed by 450 ml of Surgiphor and then suctioned dry.  A 5 mm stabilized rotating platform polyethylene insert was inserted and the knee was placed through a range of motion with excellent mediolateral soft tissue balancing appreciated and excellent patellar tracking noted. 2 medium drains were placed in the wound bed and brought out through separate stab incisions. The medial parapatellar portion of the incision was reapproximated using interrupted sutures of #1 PDS. Subcutaneous tissue was approximated in layers using first #0 Monocryl followed #2-0 Monocryl. The skin was approximated with skin staples. A sterile dressing was applied.  The patient tolerated the procedure well and was transported to the recovery room in stable condition.    Haliyah Fryman P. Angie Fava., M.D.

## 2022-06-01 NOTE — H&P (Signed)
ORTHOPAEDIC HISTORY & PHYSICAL Jayle Solarz, Adelina Mings., MD - 05/12/2022 1:45 PM EDT Formatting of this note is different from the original. Images from the original note were not included. Chief Complaint: Chief Complaint Patient presents with Post Operative Visit  Reason for Visit: The patient is a 67 y.o. male who presents today for reevaluation of his left knee. He is 6 weeks status post removal of left total knee implants and placement of an articulating PMMA spacer for a periprosthetic infection. Cultures were consistent with MSSA. He just completed a 6-week course of IV nafcillin. The PICC line has been discontinued. He has continued with the knee brace locked in extension except for bathing. He has maintained toe-touch weightbearing to the right lower extremity using forearm crutches. He denies any fevers or chills. He denies any issues with the surgical incision.  Medications: Current Outpatient Medications Medication Sig Dispense Refill aspirin 81 MG EC tablet Take 81 mg by mouth once daily atorvastatin (LIPITOR) 40 MG tablet Take 40 mg by mouth once daily AT BEDTIME FOR HIGH CHOLESTEROL celecoxib (CELEBREX) 200 MG capsule Take 1 capsule (200 mg total) by mouth 2 (two) times daily 60 capsule 0 cyclobenzaprine (FLEXERIL) 10 MG tablet Take 10 mg by mouth every 12 (twelve) hours JARDIANCE 25 mg tablet Take 25 mg by mouth once daily metFORMIN (GLUCOPHAGE) 1000 MG tablet Take 1 tablet (1,000 mg total) by mouth 2 (two) times daily with meals. 180 tablet 1 omeprazole (PRILOSEC) 20 MG DR capsule Take 20 mg by mouth once daily oxyCODONE (ROXICODONE) 20 mg/mL concentrated solution Take 5 mg by mouth every 4 (four) hours as needed for Pain Take 1-2 tablets (5-10 mg total) by mouth every 4 (four) hours as needed for breakthrough pain. pantoprazole (PROTONIX) 40 MG DR tablet Take 40 mg by mouth once daily FOR HEARTBURN pregabalin (LYRICA) 200 MG capsule Take 200 mg by mouth 3 (three) times  daily semaglutide (OZEMPIC) 1 mg/dose (4 mg/3 mL) pen injector Inject 1 mg subcutaneously once a week tadalafiL (CIALIS) 20 MG tablet Take 20 mg by mouth once daily terbinafine HCL (LAMISIL) 250 mg tablet Take 250 mg by mouth once daily triamcinolone 0.5 % cream Apply 1 Application topically 2 (two) times daily zolpidem (AMBIEN) 10 mg tablet Take 1 tablet (10 mg total) by mouth at bedtime  No current facility-administered medications for this visit.  Allergies: Allergies Allergen Reactions Buprenorphine-Naloxone Hives and Nausea And Vomiting Adhesive Tape-Silicones Rash Whelps -*Paper Tape is ok  Codeine Itching, Nausea And Vomiting and Rash Morphine Itching, Nausea And Vomiting and Rash Silicone Rash Whelps -*Paper Tape is ok  Past Medical History: Past Medical History: Diagnosis Date CAD (coronary artery disease) Chronic pain COPD (chronic obstructive pulmonary disease) (CMS/HHS-HCC) Depression Diabetes mellitus type 2, uncomplicated (CMS/HHS-HCC) Fatty liver GERD (gastroesophageal reflux disease) Hepatitis C History of chicken pox History of stroke Insomnia Vascular abnormality  Past Surgical History: Past Surgical History: Procedure Laterality Date ARTHROSCOPIC ROTATOR CUFF REPAIR 1999 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 Norval Morton Left knee unicompartmental arthroplasty 06/18/2002 Dr. Norval Morton Manipulation under anesthesia - left knee 09/10/2002 Dr. Norval Morton Left knee arthroscopy 2005 Revision of unicompartmental arthroplasty to left total knee arthroplasty replacement 04/08/2003 Dr. Norval Morton INCISION TENDON SHEATH FOR TRIGGER FINGER Left 09/17/2003 Left long finger INCISION TENDON SHEATH FOR TRIGGER FINGER Right 09/26/2003 Right long finger COLONOSCOPY 02/16/2006 DR. SIEGEL - WNL REPEAT IN 10 YRS 2018 Open debridement of the left total knee including removal of old sutures and a significant scar  debridement and  debridement of bony overgrowth 05/01/2007 Dr Charlann Boxer Revision left total knee replacement, exchange all components 08/13/2009 Dr Cruz Condon Right index and ring trigger finger release Right 06/18/2014 Dr Ernest Pine Left knee revision arthroplasty (femoral and polyethylene components) Extensive synovectomy and excision of fibrotic tissue from the left knee 07/19/2021 Dr Ernest Pine Irrigation and debridement of the left knee, polyethylene exchange, and placement of antibiotic impregnated Stimulan beads 09/11/2021 Dr Ernest Pine bladder stimulator Lumbar disc replacement at L5-S1 Dr. Norval Morton Lumbar interbody fusion at L4-5 Dr. 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 Tobacco Use Smoking status: Never Smokeless tobacco: Never Vaping Use Vaping status: Never Used Substance and Sexual Activity Alcohol use: No Alcohol/week: 0.0 standard drinks of alcohol Drug use: No Sexual activity: Defer Partners: Female  Social Determinants of Health  Financial Resource Strain: Low Risk (01/28/2022) Received from [facility], [place] Financial Resource Strain (CARDIA) Difficulty of Paying Living Expenses: Not hard at all Food Insecurity: No Food Insecurity (04/01/2022) Received from [facility], [place] Hunger Vital Sign Worried About Running Out of Food in the Last Year: Never true Ran Out of Food in the Last Year: Never true Transportation Needs: No Transportation Needs (04/01/2022) Received from [facility], [place] PRAPARE - Transportation Lack of Transportation (Medical): No Lack of Transportation (Non-Medical): No Physical Activity: Inactive (01/28/2022) Received from [facility], [place] Exercise Vital Sign Days of Exercise per Week: 0 days Minutes of  Exercise per Session: 0 min Stress: No Stress Concern Present (01/28/2022) Received from [facility], [facility] of Occupational Health - Occupational Stress Questionnaire Feeling of Stress : Not at all Social Connections: Moderately Isolated (01/28/2022) Received from [facility], [place] Social Connection and Isolation Panel [NHANES] Frequency of Communication with Friends and Family: More than three times a week Frequency of Social Gatherings with Friends and Family: More than three times a week Attends Religious Services: More than 4 times per year Active Member of [employer] or Organizations: No Attends Engineer, structural: Never Marital Status: Divorced  Family History: Family History Problem Relation Name Age of Onset Cancer Mother Diabetes type II Mother Osteoporosis (Thinning of bones) Mother Cancer Father  Review of Systems: A comprehensive 14 point ROS was performed, reviewed, and the pertinent orthopaedic findings are documented in the HPI.  Exam BP (!) 142/84  Ht 177.8 cm (5\' 10" )  Wt 88.5 kg (195 lb)  BMI 27.98 kg/m  General: Well-developed, well-nourished male seen in no acute distress. Antalgic gait with touchdown weightbearing to the left lower extremity.  HEENT: Atraumatic, normocephalic. Pupils are equal and reactive to light. Extraocular motion is intact. Sclera are clear. Oropharynx is clear with moist mucosa.  Neck: Supple, nontender, and with good ROM. No thyromegaly, adenopathy, JVD, or carotid bruits.  Lungs: Clear to auscultation bilaterally.  Cardiovascular: Regular rate and rhythm. Normal S1, S2. No murmur . No appreciable gallops or rubs. Peripheral pulses are palpable. No lower extremity edema. Homan`s test is negative.  Abdomen: Soft, nontender, nondistended. Bowel sounds are present.  Extremities: Good strength, stability, and range of motion of the upper extremities. Good range of motion of the hips and  ankles.  Left Knee: Soft tissue swelling: minimal Effusion: minimal Erythema: none Crepitance: mild Tenderness: medial, lateral Alignment: normal Mediolateral laxity: stable Patellar tracking: Good tracking without evidence of subluxation or  tilt Atrophy: Generalized quadriceps atrophy. Quadriceps tone was fair. Range of motion: Not assessed  Neurologic: Awake, alert, and oriented. Sensory function is intact to pinprick and light touch. Motor strength is judged to be 5/5. Motor coordination is within normal limits. No apparent clonus. No tremor.  X-rays: I ordered and interpreted standing AP and lateral radiographs of the left knee that were obtained in the office today. An articulating cement spacer is in place. Reasonably good alignment is noted on the AP view. No evidence of fracture or dislocation. A vascular stent is noted posterior to the knee.  Impression: Left knee periprosthetic infection (MSSA) Status post removal of total knee implants and placement of an articulating antibiotic impregnated PMMA spacer  Plan: The findings were discussed in detail with the patient.  I will have the left knee aspirated under ultrasound guidance as per Dr. Edilia Bo. Synovial fluid will be submitted for complete cell count, crystals, Gram stain, and cultures. We will repeat C-reactive protein and sedimentation rate in approximately 2 weeks and evaluate the trend. We had a lengthy discussion involving removal of the spacer and reimplantation of total knee components. The usual perioperative course was also discussed in detail. The patient expressed understanding of the risks and benefits of surgical intervention and would like to proceed with plans for surgical intervention.  I spent a total of 45 minutes in both face-to-face and non-face-to-face activities, excluding procedures performed, for this visit on the date of this encounter.  MEDICAL CLEARANCE: Per  anesthesiology. ACTIVITY: As tolerated. WORK STATUS: Not applicable. THERAPY: Preoperative physical therapy evaluation. MEDICATIONS: Requested Prescriptions  No prescriptions requested or ordered in this encounter  FOLLOW-UP: Return for postoperative follow-up.  Ector Laurel P. Angie Fava., M.D.  This note was generated in part with voice recognition software and I apologize for any typographical errors that were not detected and corrected.  Electronically signed by Shari Heritage., MD at 05/15/2022 4:52 PM EDT

## 2022-06-02 ENCOUNTER — Encounter: Payer: Self-pay | Admitting: Orthopedic Surgery

## 2022-06-02 LAB — GLUCOSE, CAPILLARY
Glucose-Capillary: 138 mg/dL — ABNORMAL HIGH (ref 70–99)
Glucose-Capillary: 153 mg/dL — ABNORMAL HIGH (ref 70–99)
Glucose-Capillary: 150 mg/dL — ABNORMAL HIGH (ref 70–99)
Glucose-Capillary: 200 mg/dL — ABNORMAL HIGH (ref 70–99)

## 2022-06-02 LAB — AEROBIC/ANAEROBIC CULTURE W GRAM STAIN (SURGICAL/DEEP WOUND)

## 2022-06-02 MED ORDER — FERROUS SULFATE 325 (65 FE) MG PO TABS
ORAL_TABLET | ORAL | Status: AC
  Filled 2022-06-02: qty 1

## 2022-06-02 MED ORDER — ENOXAPARIN SODIUM 30 MG/0.3ML IJ SOSY
PREFILLED_SYRINGE | INTRAMUSCULAR | Status: AC
  Filled 2022-06-02: qty 0.3

## 2022-06-02 MED ORDER — ZOLPIDEM TARTRATE 5 MG PO TABS
ORAL_TABLET | ORAL | Status: AC
  Filled 2022-06-02: qty 2

## 2022-06-02 MED ORDER — OXYCODONE HCL 5 MG PO TABS
ORAL_TABLET | ORAL | Status: AC
  Filled 2022-06-02: qty 2

## 2022-06-02 MED ORDER — ACETAMINOPHEN 10 MG/ML IV SOLN
INTRAVENOUS | Status: AC
  Filled 2022-06-02: qty 100

## 2022-06-02 MED ORDER — METOCLOPRAMIDE HCL 10 MG PO TABS
ORAL_TABLET | ORAL | Status: AC
  Filled 2022-06-02: qty 1

## 2022-06-02 MED ORDER — SENNOSIDES-DOCUSATE SODIUM 8.6-50 MG PO TABS
ORAL_TABLET | ORAL | Status: AC
  Filled 2022-06-02: qty 1

## 2022-06-02 MED ORDER — CELECOXIB 200 MG PO CAPS
ORAL_CAPSULE | ORAL | Status: AC
  Filled 2022-06-02: qty 1

## 2022-06-02 MED ORDER — SENNA 8.6 MG PO TABS
ORAL_TABLET | ORAL | Status: AC
  Filled 2022-06-02: qty 1

## 2022-06-02 MED ORDER — PREGABALIN 50 MG PO CAPS
ORAL_CAPSULE | ORAL | Status: AC
  Filled 2022-06-02: qty 4

## 2022-06-02 MED ORDER — ZOLPIDEM TARTRATE 5 MG PO TABS
10.0000 mg | ORAL_TABLET | Freq: Every evening | ORAL | Status: DC | PRN
  Administered 2022-06-02: 10 mg via ORAL

## 2022-06-02 MED ORDER — INSULIN ASPART 100 UNIT/ML IJ SOLN
INTRAMUSCULAR | Status: AC
  Filled 2022-06-02: qty 1

## 2022-06-02 MED ORDER — MAGNESIUM HYDROXIDE 400 MG/5ML PO SUSP
ORAL | Status: AC
  Filled 2022-06-02: qty 30

## 2022-06-02 MED ORDER — PANTOPRAZOLE SODIUM 40 MG PO TBEC
DELAYED_RELEASE_TABLET | ORAL | Status: AC
  Filled 2022-06-02: qty 1

## 2022-06-02 MED ORDER — OXYCODONE HCL 5 MG PO TABS
ORAL_TABLET | ORAL | Status: AC
  Filled 2022-06-02: qty 4

## 2022-06-02 MED ORDER — PREGABALIN 50 MG PO CAPS
ORAL_CAPSULE | ORAL | Status: AC
  Filled 2022-06-02: qty 1

## 2022-06-02 MED ORDER — ATORVASTATIN CALCIUM 20 MG PO TABS
ORAL_TABLET | ORAL | Status: AC
  Filled 2022-06-02: qty 1

## 2022-06-02 MED ORDER — PREGABALIN 50 MG PO CAPS
ORAL_CAPSULE | ORAL | Status: AC
  Filled 2022-06-02: qty 3

## 2022-06-02 MED ORDER — CEFAZOLIN SODIUM-DEXTROSE 2-4 GM/100ML-% IV SOLN
INTRAVENOUS | Status: AC
  Filled 2022-06-02: qty 100

## 2022-06-02 NOTE — Evaluation (Signed)
Physical Therapy Evaluation Patient Details Name: Erik Martinez MRN: [id number] DOB: 1955-07-08 Today's Date: 06/02/2022  History of Present Illness  Pt admitted for L TK revision with I&D. PMH of chronic pain syndrome, avasculay necrosis of bilateral hips, bipolar disorder, neurogenic bladder, GERD, Hep C, hep B, DM, anxiety, HLD.   Clinical Impression  Patient alert, agreeable to PT, reported 5/10 L knee pain. Pt reported at baseline he uses loftstrand crutches due to ongoing L knee pain and previous surgeries. Lives alone and has neighbors/friends that can check in if needed.  He performed bed mobility modI. Sit <> stand with RW and supervision. He ambulated ~175ft with RW and CGA, noted for little to no L knee flexion throughout. Seated heel slides with overpressure to improve knee ROM as able, 0 - 45degrees. Pt requested to return to bed for now, agreeable to transferring to recliner with nursing staff for lunch.  Overall the patient demonstrated deficits (see "PT Problem List") that impede the patient's functional abilities, safety, and mobility and would benefit from skilled PT intervention. Recommendation is to continued skilled PT services to maximize function.         Recommendations for follow up therapy are one component of a multi-disciplinary discharge planning process, led by the attending physician.  Recommendations may be updated based on patient status, additional functional criteria and insurance authorization.  Follow Up Recommendations       Assistance Recommended at Discharge PRN  Patient can return home with the following  Assist for transportation;Help with stairs or ramp for entrance    Equipment Recommendations None recommended by PT  Recommendations for Other Services       Functional Status Assessment Patient has had a recent decline in their functional status and demonstrates the ability to make significant improvements in function in a reasonable and  predictable amount of time.     Precautions / Restrictions Precautions Precautions: Fall Restrictions Weight Bearing Restrictions: Yes LLE Weight Bearing: Weight bearing as tolerated      Mobility  Bed Mobility Overal bed mobility: Modified Independent                  Transfers Overall transfer level: Needs assistance Equipment used: Rolling walker (2 wheels) Transfers: Sit to/from Stand Sit to Stand: Supervision                Ambulation/Gait Ambulation/Gait assistance: Min guard Gait Distance (Feet): 150 Feet Assistive device: Rolling walker (2 wheels)         General Gait Details: antalgic on LLE, minimal L knee flexion noted  Stairs            Wheelchair Mobility    Modified Rankin (Stroke Patients Only)       Balance Overall balance assessment: Needs assistance Sitting-balance support: Feet supported, No upper extremity supported Sitting balance-Leahy Scale: Normal Sitting balance - Comments: steady sitting, reaching outside BOS.   Standing balance support: During functional activity, Reliant on assistive device for balance, Bilateral upper extremity supported Standing balance-Leahy Scale: Good                               Pertinent Vitals/Pain Pain Assessment Pain Assessment: 0-10 Pain Score: 5  Pain Location: LLE Pain Descriptors / Indicators: Aching, Sore, Grimacing, Guarding Pain Intervention(s): Limited activity within patient's tolerance, Monitored during session, Repositioned    Home Living Family/patient expects to be discharged to:: Private residence Living Arrangements: Alone Available  Help at Discharge: Friend(s);Available PRN/intermittently Type of Home:  (Large RV) Home Access: Ramped entrance       Home Layout: One level Home Equipment: Educational psychologist (2 wheels) Additional Comments: Pt is able to use shower with TTB in barn that is nearby    Prior Function Prior Level of Function  : Independent/Modified Independent             Mobility Comments: using bilateral loftstrand crutches due to ongoing LLE pain ADLs Comments: IND with ADLs/IADLs     Hand Dominance   Dominant Hand: Right    Extremity/Trunk Assessment   Upper Extremity Assessment Upper Extremity Assessment: Defer to OT evaluation    Lower Extremity Assessment Lower Extremity Assessment: Defer to PT evaluation;LLE deficits/detail LLE Deficits / Details: s/p LTK revision with I&D, WBAT       Communication   Communication: No difficulties  Cognition Arousal/Alertness: Awake/alert Behavior During Therapy: WFL for tasks assessed/performed Overall Cognitive Status: Within Functional Limits for tasks assessed                                          General Comments      Exercises Total Joint Exercises Heel Slides: AAROM, AROM, Strengthening, Left (10 AROM, 20 AAROM) Goniometric ROM: 0-45 degrees   Assessment/Plan    PT Assessment Patient needs continued PT services  PT Problem List Decreased strength;Decreased mobility;Decreased range of motion;Decreased coordination;Decreased activity tolerance;Decreased balance;Pain;Decreased knowledge of use of DME       PT Treatment Interventions DME instruction;Therapeutic activities;Gait training;Therapeutic exercise;Patient/family education;Stair training;Balance training;Functional mobility training;Neuromuscular re-education    PT Goals (Current goals can be found in the Care Plan section)  Acute Rehab PT Goals Patient Stated Goal: to have less pain Time For Goal Achievement: 06/16/22 Potential to Achieve Goals: Good    Frequency BID     Co-evaluation PT/OT/SLP Co-Evaluation/Treatment: Yes Reason for Co-Treatment: To address functional/ADL transfers PT goals addressed during session: Mobility/safety with mobility OT goals addressed during session: Proper use of Adaptive equipment and DME       AM-PAC PT "6  Clicks" Mobility  Outcome Measure Help needed turning from your back to your side while in a flat bed without using bedrails?: None Help needed moving from lying on your back to sitting on the side of a flat bed without using bedrails?: None Help needed moving to and from a bed to a chair (including a wheelchair)?: None Help needed standing up from a chair using your arms (e.g., wheelchair or bedside chair)?: None Help needed to walk in hospital room?: A Little Help needed climbing 3-5 steps with a railing? : A Little 6 Click Score: 22    End of Session   Activity Tolerance: Patient tolerated treatment well Patient left: in bed;with call bell/phone within reach;with bed alarm set Nurse Communication: Mobility status PT Visit Diagnosis: Other abnormalities of gait and mobility (R26.89);Muscle weakness (generalized) (M62.81);Difficulty in walking, not elsewhere classified (R26.2);Pain Pain - Right/Left: Left Pain - part of body: Knee    Time: 4098-1191 PT Time Calculation (min) (ACUTE ONLY): 16 min   Charges:   PT Evaluation $PT Eval Low Complexity: 1 Low PT Treatments $Therapeutic Exercise: 8-22 mins        Olga Coaster PT, DPT 12:52 PM,06/02/22

## 2022-06-02 NOTE — Progress Notes (Signed)
Subjective: 1 Day Post-Op Procedure(s) (LRB): TOTAL KNEE REVISION (Left) IRRIGATION AND DEBRIDEMENT KNEE (Left) Patient reports pain as 5 on 0-10 scale.  States that this is pretty having pain very tolerable Patient seen in rounds with Dr. Mitzi Hansen. Patient is well, and has had no acute complaints or problems -denies having any shortness of breath chest pain, nausea/vomiting.  Denies having a bowel movement, but states he had 1 day of surgery We will start therapy today.  Patient is excited to get up and move Plan is to go Home after hospital stay.  Objective: Vital signs in last 24 hours: Temp:  [97.2 F (36.2 C)-98.5 F (36.9 C)] 98.5 F (36.9 C) (05/23 0744) Pulse Rate:  [78-108] 78 (05/23 0744) Resp:  [6-18] 16 (05/23 0321) BP: (120-155)/(63-91) 120/66 (05/23 0744) SpO2:  [94 %-100 %] 100 % (05/23 0744)  Intake/Output from previous day:  Intake/Output Summary (Last 24 hours) at 06/02/2022 0801 Last data filed at 06/02/2022 0634 Gross per 24 hour  Intake 3855.03 ml  Output 2220 ml  Net 1635.03 ml    Intake/Output this shift: No intake/output data recorded.  Labs: No results for input(s): "HGB" in the last 72 hours. No results for input(s): "WBC", "RBC", "HCT", "PLT" in the last 72 hours. No results for input(s): "NA", "K", "CL", "CO2", "BUN", "CREATININE", "GLUCOSE", "CALCIUM" in the last 72 hours. No results for input(s): "LABPT", "INR" in the last 72 hours.  EXAM General - Patient is Alert, Appropriate, and Oriented Extremity - Neurologically intact ABD soft Neurovascular intact Sensation intact distally Intact pulses distally Dorsiflexion/Plantar flexion intact Compartment soft Dressing -  bulky dressing placed.  Patient does not have a JP drain in place having minimal output during surgery.  Will return tomorrow to take down bulky dressing starts me back over his incision having any output. Motor Function - intact, moving foot and toes well on exam.  Patient able to  dorsi and plantarflex without issue.  Range of motion/strength.  Patient can straight leg raise good strength. No JP drain was placed.  Patient is neurovascularly intact to all lower leg dermatomes, posterior tibial pulses appreciated 2+  Past Medical History:  Diagnosis Date   Anemia    Anxiety    Arthritis    Avascular necrosis of bones of both hips (HCC)    Bipolar disorder (HCC)    Bladder spasms    Chronic pain of left knee    Chronic pain syndrome    a.) followed by pain clinic   Cocaine use disorder, moderate, dependence (HCC) 09/09/2015   DDD (degenerative disc disease)    Depression    Erectile dysfunction    a.) on PDE5i (sildenafil)   Frequency-urgency syndrome    GERD (gastroesophageal reflux disease)    Hepatitis C 2018   History of hepatitis B    1983  TX'D--  NO ISSUES OR SYMPTOMS SINCE   Hyperlipidemia    Hypotestosteronemia    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   Neurogenic bladder    Nocturia    PAD (peripheral artery disease) (HCC)    Polysubstance abuse (HCC)    a.) cocaine + marijuana + BZO + opioids   PONV (postoperative nausea and vomiting)    "difficult to put me to sleep"   Spinal headache    T2DM (type 2 diabetes mellitus) (HCC)    Urine incontinence     Assessment/Plan: 1 Day Post-Op Procedure(s) (LRB): TOTAL KNEE REVISION (Left) IRRIGATION AND DEBRIDEMENT KNEE (Left) Principal Problem:   History of  revision of total knee arthroplasty  Estimated body mass index is 27.99 kg/m as calculated from the following:   Height as of this encounter: 5\' 10"  (1.778 m).   Weight as of this encounter: 88.5 kg. Advance diet Up with therapy -patient says that he is excited to get up and move around with physical therapy this morning.  Urged to work diligently but states this time  Patient has asking for medication support in clinic, continue on a medication schedule at home added oxycodone and tramadol Patient also given Celebrex to help with  swelling inflammation  Discussed importance of wearing TED hose and use of Lovenox for DVT prophylaxis  Plan to take down the bulky Jones dressing tomorrow morning, look for any output in Hemovac drain placed over patient's incision site  Discussed continuing to use support to Help with pain and swelling.  DVT Prophylaxis - Lovenox, TED hose, and SCDs Weight-Bearing as tolerated to left leg   Rayburn Go, PA-C [facility] 06/02/2022, 8:01 AM

## 2022-06-02 NOTE — Progress Notes (Signed)
Pt states he takes Ambien 10 mg at bedtime. MD Hooten notified, verbal orders received to change from Ambien 5 mg to Ambien 10 mg  at bedtime PRN.

## 2022-06-02 NOTE — Evaluation (Signed)
Occupational Therapy Evaluation Patient Details Name: Erik Martinez MRN: [id number] DOB: 1955/06/03 Today's Date: 06/02/2022   History of Present Illness Pt admitted for L TK revision with I&D. PMH significant for DMII, anxiety, depression, & PVD.   Clinical Impression   Mr. Hungerford was seen for OT/PT co-evaluation this date. Pt reports he is independent with ADL management at baseline. Pt received dressed in street clothing, reports getting himself dressed this AM without assistance. Pt is able to return demo understanding of safe use of LH reacher and sock aid for LB ADL management. He denies need for assistance or further ADL training at this time. No further skilled OT needs identified. Will sign off. Please re-consult if additional OT needs arise during this hospital stay.      Recommendations for follow up therapy are one component of a multi-disciplinary discharge planning process, led by the attending physician.  Recommendations may be updated based on patient status, additional functional criteria and insurance authorization.   Assistance Recommended at Discharge Set up Supervision/Assistance  Patient can return home with the following A little help with walking and/or transfers;Assistance with cooking/housework;Assist for transportation;Help with stairs or ramp for entrance    Functional Status Assessment  Patient has not had a recent decline in their functional status  Equipment Recommendations  None recommended by OT    Recommendations for Other Services       Precautions / Restrictions Precautions Precautions: Fall Restrictions Weight Bearing Restrictions: Yes LLE Weight Bearing: Weight bearing as tolerated      Mobility Bed Mobility Overal bed mobility: Needs Assistance             General bed mobility comments: deferred. Seated EOB at start/end of session.    Transfers Overall transfer level: Needs assistance Equipment used: Rolling walker (2  wheels) Transfers: Sit to/from Stand Sit to Stand: Supervision                  Balance Overall balance assessment: Needs assistance Sitting-balance support: Feet supported, No upper extremity supported Sitting balance-Leahy Scale: Normal Sitting balance - Comments: steady sitting, reaching outside BOS.   Standing balance support: During functional activity, Reliant on assistive device for balance, Bilateral upper extremity supported Standing balance-Leahy Scale: Good                             ADL either performed or assessed with clinical judgement   ADL Overall ADL's : At baseline                                       General ADL Comments: Pt able to perform LB dressing, UB dressing, and functional mobility at or near baseline level of functional independence. Educated on safe use of sock aid with handout provided. Declines need for further ADL training at this time.     Vision Patient Visual Report: No change from baseline       Perception     Praxis      Pertinent Vitals/Pain Pain Assessment Pain Assessment: 0-10 Pain Score: 5  Pain Location: LLE Pain Descriptors / Indicators: Aching, Sore, Grimacing, Guarding Pain Intervention(s): Limited activity within patient's tolerance, Monitored during session, Repositioned     Hand Dominance Right   Extremity/Trunk Assessment Upper Extremity Assessment Upper Extremity Assessment: Overall WFL for tasks assessed   Lower Extremity Assessment Lower Extremity Assessment:  Defer to PT evaluation;LLE deficits/detail LLE Deficits / Details: s/p LTK revision with I&D, WBAT       Communication Communication Communication: No difficulties   Cognition Arousal/Alertness: Awake/alert Behavior During Therapy: WFL for tasks assessed/performed Overall Cognitive Status: Within Functional Limits for tasks assessed                                       General Comments        Exercises Other Exercises Other Exercises: Pt educated on falls prevention strategies, safe use of AE/DME for ADL management, and routines modifications to support safety and functional independence during ADL management. Return demos understanding of education provided.   Shoulder Instructions      Home Living Family/patient expects to be discharged to:: Private residence Living Arrangements: Alone Available Help at Discharge: Friend(s);Available PRN/intermittently Type of Home:  (Large RV) Home Access: Ramped entrance     Home Layout: One level     Bathroom Shower/Tub: Tub/shower unit   Bathroom Toilet: Handicapped height     Home Equipment: Educational psychologist (2 wheels)   Additional Comments: Pt is able to use shower with TTB in barn that is nearby      Prior Functioning/Environment Prior Level of Function : Independent/Modified Independent             Mobility Comments: using bilateral loftstrand crutches due to ongoing LLE pain ADLs Comments: IND with ADLs/IADLs        OT Problem List: Decreased strength;Pain;Decreased coordination;Decreased activity tolerance;Decreased safety awareness;Impaired balance (sitting and/or standing);Decreased knowledge of use of DME or AE      OT Treatment/Interventions:      OT Goals(Current goals can be found in the care plan section) Acute Rehab OT Goals Patient Stated Goal: To go home OT Goal Formulation: All assessment and education complete, DC therapy Time For Goal Achievement: 06/02/22 Potential to Achieve Goals: Good  OT Frequency:      Co-evaluation              AM-PAC OT "6 Clicks" Daily Activity     Outcome Measure Help from another person eating meals?: None Help from another person taking care of personal grooming?: None Help from another person toileting, which includes using toliet, bedpan, or urinal?: None Help from another person bathing (including washing, rinsing, drying)?: None Help  from another person to put on and taking off regular upper body clothing?: None Help from another person to put on and taking off regular lower body clothing?: None 6 Click Score: 24   End of Session Equipment Utilized During Treatment: Gait belt;Rolling walker (2 wheels)  Activity Tolerance: Patient tolerated treatment well Patient left: Other (comment) (seated EOB with PT in room to finish up session.)  OT Visit Diagnosis: Other abnormalities of gait and mobility (R26.89);Pain Pain - Right/Left: Left Pain - part of body: Knee                Time: 1610-9604 OT Time Calculation (min): 13 min Charges:  OT General Charges $OT Visit: 1 Visit OT Evaluation $OT Eval Low Complexity: 1 Low  Rockney Ghee, M.S., OTR/L 06/02/22, 12:46 PM

## 2022-06-02 NOTE — Progress Notes (Addendum)
Physical Therapy Treatment Patient Details Name: Erik Martinez MRN: [id number] DOB: 11-26-1955 Today's Date: 06/02/2022   History of Present Illness Pt admitted for L TK revision with I&D. PMH of chronic pain syndrome, avasculay necrosis of bilateral hips, bipolar disorder, neurogenic bladder, GERD, Hep C, hep B, DM, anxiety, HLD.    PT Comments    Patient alert, agreeable to PT reported 5/10 L knee pain. Pt requested to perform ADLs during session, able to stand with good balance and needed set up assistance only. Pt remained modI for bed mobility and progressed to supervision for transfers and ambulation. He was also able to perform stair navigation with safe technique. Returned to room and pt requested to return to bed. The patient would benefit from further skilled PT intervention to continue to progress towards goals.     Recommendations for follow up therapy are one component of a multi-disciplinary discharge planning process, led by the attending physician.  Recommendations may be updated based on patient status, additional functional criteria and insurance authorization.  Follow Up Recommendations       Assistance Recommended at Discharge PRN  Patient can return home with the following Assist for transportation;Help with stairs or ramp for entrance   Equipment Recommendations  None recommended by PT    Recommendations for Other Services       Precautions / Restrictions Precautions Precautions: Fall Restrictions Weight Bearing Restrictions: Yes LLE Weight Bearing: Weight bearing as tolerated     Mobility  Bed Mobility Overal bed mobility: Modified Independent                  Transfers Overall transfer level: Needs assistance Equipment used: Rolling walker (2 wheels) Transfers: Sit to/from Stand Sit to Stand: Supervision                Ambulation/Gait Ambulation/Gait assistance: Supervision Gait Distance (Feet): 220 Feet Assistive device:  Rolling walker (2 wheels)         General Gait Details: antalgic on LLE, minimal L knee flexion noted, cued for more flexion during swing phase   Stairs Stairs: Yes Stairs assistance: Min guard Stair Management: Two rails, Step to pattern Number of Stairs: 4 General stair comments: steady, safe   Wheelchair Mobility    Modified Rankin (Stroke Patients Only)       Balance Overall balance assessment: Needs assistance Sitting-balance support: Feet supported, No upper extremity supported Sitting balance-Leahy Scale: Normal Sitting balance - Comments: steady sitting, reaching outside BOS.   Standing balance support: During functional activity, Reliant on assistive device for balance, Bilateral upper extremity supported Standing balance-Leahy Scale: Good Standing balance comment: able to stand at sink and perform ADLs with set up assistance only                            Cognition Arousal/Alertness: Awake/alert Behavior During Therapy: WFL for tasks assessed/performed Overall Cognitive Status: Within Functional Limits for tasks assessed                                          Exercises Total Joint Exercises Heel Slides: AAROM, AROM, Strengthening, Left Goniometric ROM: 0-55 degrees    General Comments        Pertinent Vitals/Pain Pain Assessment Pain Assessment: 0-10 Pain Score: 5  Pain Location: LLE Pain Descriptors / Indicators: Aching, Sore, Grimacing, Guarding Pain  Intervention(s): Limited activity within patient's tolerance, Monitored during session, Premedicated before session, Repositioned, Ice applied    Home Living Family/patient expects to be discharged to:: Private residence Living Arrangements: Alone Available Help at Discharge: Friend(s);Available PRN/intermittently Type of Home:  (Large RV) Home Access: Ramped entrance       Home Layout: One level Home Equipment: Educational psychologist (2  wheels) Additional Comments: Pt is able to use shower with TTB in barn that is nearby    Prior Function            PT Goals (current goals can now be found in the care plan section) Acute Rehab PT Goals Patient Stated Goal: to have less pain Time For Goal Achievement: 06/16/22 Potential to Achieve Goals: Good Progress towards PT goals: Progressing toward goals    Frequency    BID      PT Plan Current plan remains appropriate    Co-evaluation PT/OT/SLP Co-Evaluation/Treatment: Yes Reason for Co-Treatment: To address functional/ADL transfers PT goals addressed during session: Mobility/safety with mobility OT goals addressed during session: Proper use of Adaptive equipment and DME      AM-PAC PT "6 Clicks" Mobility   Outcome Measure  Help needed turning from your back to your side while in a flat bed without using bedrails?: None Help needed moving from lying on your back to sitting on the side of a flat bed without using bedrails?: None Help needed moving to and from a bed to a chair (including a wheelchair)?: None Help needed standing up from a chair using your arms (e.g., wheelchair or bedside chair)?: None Help needed to walk in hospital room?: None Help needed climbing 3-5 steps with a railing? : A Little 6 Click Score: 23    End of Session   Activity Tolerance: Patient tolerated treatment well Patient left: in bed;with call bell/phone within reach;with SCD's reapplied Nurse Communication: Mobility status PT Visit Diagnosis: Other abnormalities of gait and mobility (R26.89);Muscle weakness (generalized) (M62.81);Difficulty in walking, not elsewhere classified (R26.2);Pain Pain - Right/Left: Left Pain - part of body: Knee     Time: 1610-9604 PT Time Calculation (min) (ACUTE ONLY): 23 min  Charges:  $Therapeutic Exercise: 8-22 mins $Therapeutic Activity: 8-22 mins                     Olga Coaster PT, DPT 2:16 PM,06/02/22

## 2022-06-03 LAB — AEROBIC/ANAEROBIC CULTURE W GRAM STAIN (SURGICAL/DEEP WOUND): Culture: NO GROWTH

## 2022-06-03 LAB — GLUCOSE, CAPILLARY: Glucose-Capillary: 112 mg/dL — ABNORMAL HIGH (ref 70–99)

## 2022-06-03 MED ORDER — ENOXAPARIN SODIUM 30 MG/0.3ML IJ SOSY
PREFILLED_SYRINGE | INTRAMUSCULAR | Status: AC
  Filled 2022-06-03: qty 0.3

## 2022-06-03 MED ORDER — CEFADROXIL 500 MG PO CAPS: 1000.0000 mg | ORAL_CAPSULE | Freq: Two times a day (BID) | ORAL | 1 refills | Status: DC

## 2022-06-03 MED ORDER — PANTOPRAZOLE SODIUM 40 MG PO TBEC
DELAYED_RELEASE_TABLET | ORAL | Status: AC
  Filled 2022-06-03: qty 1

## 2022-06-03 MED ORDER — METOCLOPRAMIDE HCL 10 MG PO TABS
ORAL_TABLET | ORAL | Status: AC
  Filled 2022-06-03: qty 1

## 2022-06-03 MED ORDER — SENNOSIDES-DOCUSATE SODIUM 8.6-50 MG PO TABS
ORAL_TABLET | ORAL | Status: AC
  Filled 2022-06-03: qty 1

## 2022-06-03 MED ORDER — FERROUS SULFATE 325 (65 FE) MG PO TABS
ORAL_TABLET | ORAL | Status: AC
  Filled 2022-06-03: qty 1

## 2022-06-03 MED ORDER — CEFADROXIL 500 MG PO CAPS
1000.0000 mg | ORAL_CAPSULE | Freq: Two times a day (BID) | ORAL | Status: DC
  Administered 2022-06-03: 1000 mg via ORAL
  Filled 2022-06-03 (×2): qty 2

## 2022-06-03 MED ORDER — OXYCODONE HCL 5 MG PO TABS: 5.0000 mg | ORAL_TABLET | ORAL | 0 refills | Status: DC | PRN

## 2022-06-03 MED ORDER — CELECOXIB 200 MG PO CAPS
ORAL_CAPSULE | ORAL | Status: AC
  Filled 2022-06-03: qty 1

## 2022-06-03 MED ORDER — ASPIRIN 81 MG PO CHEW
CHEWABLE_TABLET | ORAL | Status: AC
  Filled 2022-06-03: qty 1

## 2022-06-03 MED ORDER — ASPIRIN 81 MG PO TBEC: 81.0000 mg | DELAYED_RELEASE_TABLET | Freq: Two times a day (BID) | ORAL | 2 refills | Status: AC

## 2022-06-03 MED ORDER — OXYCODONE HCL 5 MG PO TABS
ORAL_TABLET | ORAL | Status: AC
  Filled 2022-06-03: qty 4

## 2022-06-03 MED ORDER — METFORMIN HCL 500 MG PO TABS
ORAL_TABLET | ORAL | Status: AC
  Filled 2022-06-03: qty 2

## 2022-06-03 MED ORDER — TRAMADOL HCL 50 MG PO TABS: 50.0000 mg | ORAL_TABLET | ORAL | 0 refills | Status: DC | PRN

## 2022-06-03 MED ORDER — CELECOXIB 200 MG PO CAPS: 200.0000 mg | ORAL_CAPSULE | Freq: Two times a day (BID) | ORAL | 2 refills | Status: DC

## 2022-06-03 MED ORDER — CEFADROXIL 500 MG PO CAPS: 1000.0000 mg | ORAL_CAPSULE | Freq: Two times a day (BID) | ORAL | 3 refills | Status: DC

## 2022-06-03 MED ORDER — MAGNESIUM HYDROXIDE 400 MG/5ML PO SUSP
ORAL | Status: AC
  Filled 2022-06-03: qty 30

## 2022-06-03 MED ORDER — ASPIRIN 81 MG PO TBEC
81.0000 mg | DELAYED_RELEASE_TABLET | Freq: Two times a day (BID) | ORAL | Status: DC
  Administered 2022-06-03: 81 mg via ORAL
  Filled 2022-06-03: qty 1

## 2022-06-03 MED ORDER — PREGABALIN 50 MG PO CAPS
ORAL_CAPSULE | ORAL | Status: AC
  Filled 2022-06-03: qty 4

## 2022-06-03 NOTE — Progress Notes (Signed)
Pt IV discontinued. Pt sat in wheelchair. All belongings were gathered. Pt taken down to the entrance where he was met by friends. Pt was placed in car to be taken home.

## 2022-06-03 NOTE — Progress Notes (Signed)
ORTHOPAEDICS PROGRESS NOTE  PATIENT NAME: Erik Martinez DOB: 1955/05/26  MRN: [id number]  POD # 2: Left total knee revision arthroplasty, removal of spacer  Subjective: Patient states pain is under good control. Tolerated PT extremely well yesterday. Would like to try the crutches today.  Objective: Vital signs in last 24 hours: Temp:  [98.1 F (36.7 C)-98.4 F (36.9 C)] 98.1 F (36.7 C) (05/24 0730) Pulse Rate:  [81-101] 92 (05/24 0730) Resp:  [16-18] 18 (05/24 0730) BP: (114-128)/(55-71) 128/71 (05/24 0730) SpO2:  [99 %-100 %] 100 % (05/24 0730)  Intake/Output from previous day: 05/23 0701 - 05/24 0700 In: -  Out: 2300 [Urine:2300]  EXAM General: WDWN male in no apparent discomfort Left lower extremity: Dressing changed. WoundVac in place (minimal output).Mild sosft tissue swelling. No erythema or ecchymosis. SLR with minimal support. Homan test is negative Neurologic: Awake, alert, and oriented. Sensory and motor function are grossly intact.   Wound cultures: No growth. PT notes reviewed.  Assessment: Left total knee revision arthroplasty  Hx of left periprosthetic knee infection  Plan: Will send home with woundVac. Duricef 1 gm PO bid (as per recommendations from Dr. Rivka Safer) Home PT has been arranged. Plan is to go Home after hospital stay. DVT Prophylaxis - Aspirin and TED hose  Alyzabeth Pontillo P. Angie Fava M.D.

## 2022-06-03 NOTE — Discharge Summary (Signed)
Physician Discharge Summary  Subjective: 2 Days Post-Op Procedure(s) (LRB): TOTAL KNEE REVISION (Left) IRRIGATION AND DEBRIDEMENT KNEE (Left) Patient reports pain as mild.   Patient seen in rounds with Dr. Ernest Pine. Patient is well, and has had no acute complaints or problems Patient is ready to go home  Physician Discharge Summary  Patient ID: BEZALEEL KEMPEN MRN: [id number] DOB/AGE: 1955/02/04 67 y.o.  Admit date: 06/01/2022 Discharge date: 06/03/2022  Admission Diagnoses:  Discharge Diagnoses:  Principal Problem:   History of revision of total knee arthroplasty   Discharged Condition: good  Hospital Course: Mr. Rollen Sox presented to the hospital on 06/01/2022 to have a periprosthetic antibiotic cement spacer removal, irrigation and debridement left total knee revision arthroplasty performed by Dr. Ernest Pine.  Patient tolerated the surgery well, it was lasting nearly 7 hours. See operative details below.  Postoperatively the patient was eager to work with physical therapy and his pain was improved from presurgical baseline.  He is tolerating physical therapy, ambulating and passing his PT protocols after surgery.  Wound VAC was placed over the initial wound site, did not initially have any output post op day 1.  Was noted to have some blood in his wound VAC on postop day 2.  Patient will be sent home with this still intact to help with any drainage in the coming days. Infectious disease was consulted for prophylactic treatment to decrease risk of infection. Suggested cefadroxil 500 mg BID, continue at home daily. Vital signs are stable, PT protocols, patient seems stable for discharge.   Op Details:  PRE-OPERATIVE DIAGNOSIS: Status post left knee periprosthetic infection with placement of articulating antibiotic impregnated cement spacer   POST-OPERATIVE DIAGNOSIS:  Same   PROCEDURE: Removal of antibiotic impregnated articulating cement spacer, irrigation and debridement, and left total  knee revision arthroplasty   SURGEON:  Jena Gauss. M.D.   ASSISTANT:  Gean Birchwood, PA-C (present and scrubbed throughout the case, critical for assistance with exposure, retraction, instrumentation, and closure)   ANESTHESIA: general   ESTIMATED BLOOD LOSS: 100 mL   FLUIDS REPLACED: 2000 mL of crystalloid   TOURNIQUET TIME: #1 - 120 minutes         #2 - 65 minutes   DRAINS: Wound VAC   IMPLANTS UTILIZED: DePuy Attune size revision CRS femoral component, 8 mm medial and lateral posterior femoral augments, size 30 mm fully porous-coated revision femoral sleeve, 14 mm x 110 mm revision press-fit stem, size 4 revision rotating platform tibial base, 37 mm fully porous-coated revision tibial sleeve, 12 mm x 110 mm revision press-fit stem, 41 mm sized dome patella, and a 5 mm rotating platform posterior stabilized polyethylene insert.    Treatments: antibiotics: cefadroxil 500 mg BID, patient sent home with prescription   Discharge Exam: Blood pressure 128/71, pulse 92, temperature 98.1 F (36.7 C), temperature source Temporal, resp. rate 18, height 5\' 10"  (1.778 m), weight 88.5 kg, SpO2 100 %.   Disposition: home   Allergies as of 06/03/2022       Reactions   Suboxone [buprenorphine Hcl-naloxone Hcl] Nausea And Vomiting   Tape Other (See Comments)   Whelps *Paper Tape is ok    Codeine Rash, Itching, Nausea Only   Morphine And Codeine Itching, Rash   Other Rash   Telemetry electrodes   Silicone Rash   Whelps -*Paper Tape is ok         Medication List     TAKE these medications    acetaminophen 500 MG tablet Commonly known  as: TYLENOL Take 1,500 mg by mouth 2 (two) times daily as needed for moderate pain or mild pain.   aspirin EC 81 MG tablet Take 1 tablet (81 mg total) by mouth in the morning and at bedtime. Swallow whole. What changed: when to take this   atorvastatin 20 MG tablet Commonly known as: LIPITOR Take 20 mg by mouth at bedtime.   cefadroxil  500 MG capsule Commonly known as: DURICEF Take 2 capsules (1,000 mg total) by mouth 2 (two) times daily.   celecoxib 200 MG capsule Commonly known as: CELEBREX Take 1 capsule (200 mg total) by mouth 2 (two) times daily.   cyclobenzaprine 10 MG tablet Commonly known as: FLEXERIL 3 (three) times daily.   empagliflozin 25 MG Tabs tablet Commonly known as: JARDIANCE Take 1 tablet (25 mg total) by mouth daily.   ferrous sulfate 324 MG Tbec TAKE ONE TABLET BY MOUTH MONDAY,WEDNESDAY,FRIDAY   lidocaine 5 % Commonly known as: Lidoderm Place 1 patch onto the skin daily. Remove & Discard patch within 12 hours or as directed by MD What changed:  when to take this reasons to take this   metFORMIN 1000 MG tablet Commonly known as: GLUCOPHAGE Take 1,000 mg by mouth 2 (two) times daily.   ondansetron 4 MG tablet Commonly known as: Zofran Take 1 tablet (4 mg total) by mouth every 8 (eight) hours as needed for nausea or vomiting.   Oxycodone HCl 20 MG Tabs Take 1 tablet by mouth every 6 (six) hours as needed. What changed: Another medication with the same name was added. Make sure you understand how and when to take each.   oxyCODONE 5 MG immediate release tablet Commonly known as: Oxy IR/ROXICODONE Take 1 tablet (5 mg total) by mouth every 4 (four) hours as needed for moderate pain (pain score 4-6). What changed: You were already taking a medication with the same name, and this prescription was added. Make sure you understand how and when to take each.   Ozempic (0.25 or 0.5 MG/DOSE) 2 MG/3ML Sopn Generic drug: Semaglutide(0.25 or 0.5MG /DOS) Inject 0.5 mg into the skin once a week. Monday   pantoprazole 40 MG tablet Commonly known as: Protonix Take 1 tablet (40 mg total) by mouth daily.   pregabalin 200 MG capsule Commonly known as: Lyrica Take 1 capsule (200 mg total) by mouth in the morning, at noon, and at bedtime.   tadalafil 20 MG tablet Commonly known as: CIALIS Take 20  mg by mouth daily as needed for erectile dysfunction.   tiZANidine 4 MG tablet Commonly known as: ZANAFLEX Take 4 mg by mouth 3 (three) times daily as needed.   traMADol 50 MG tablet Commonly known as: ULTRAM Take 1-2 tablets (50-100 mg total) by mouth every 4 (four) hours as needed for moderate pain.   zolpidem 10 MG tablet Commonly known as: AMBIEN Take 1 tablet (10 mg total) by mouth at bedtime as needed. for sleep               Durable Medical Equipment  (From admission, onward)           Start     Ordered   06/01/22 1756  DME Walker rolling  Once       Question:  Patient needs a walker to treat with the following condition  Answer:  Total knee replacement status   06/01/22 1755   06/01/22 1756  DME Bedside commode  Once       Comments: Patient is  not able to walk the distance required to go the bathroom, or he/she is unable to safely negotiate stairs required to access the bathroom.  A 3in1 BSC will alleviate this problem  Question:  Patient needs a bedside commode to treat with the following condition  Answer:  Total knee replacement status   06/01/22 1755            Follow-up Information     Rayburn Go, PA-C Follow up on 06/16/2022.   Specialty: Orthopedic Surgery Why: at 1:15pm Contact information: [address] [place] [address] [phone]         Donato Heinz, MD Follow up on 07/19/2022.   Specialty: Orthopedic Surgery Why: at 3:00pm Contact information: [address] [facility] [place] [address] [phone]                 Signed: Gean Birchwood 06/03/2022, 9:21 AM   Objective: Vital signs in last 24 hours: Temp:  [98.1 F (36.7 C)-98.4 F (36.9 C)] 98.1 F (36.7 C) (05/24 0730) Pulse Rate:  [81-101] 92 (05/24 0730) Resp:  [16-18] 18 (05/24 0730) BP: (114-128)/(55-71) 128/71 (05/24 0730) SpO2:  [99 %-100 %] 100 % (05/24 0730)  Intake/Output from previous day:  Intake/Output  Summary (Last 24 hours) at 06/03/2022 0921 Last data filed at 06/03/2022 5784 Gross per 24 hour  Intake --  Output 2300 ml  Net -2300 ml    Intake/Output this shift: No intake/output data recorded.  Labs: No results for input(s): "HGB" in the last 72 hours. No results for input(s): "WBC", "RBC", "HCT", "PLT" in the last 72 hours. No results for input(s): "NA", "K", "CL", "CO2", "BUN", "CREATININE", "GLUCOSE", "CALCIUM" in the last 72 hours. No results for input(s): "LABPT", "INR" in the last 72 hours.  EXAM:  General: WDWN male in no apparent discomfort Left lower extremity: Dressing changed. WoundVac in place (minimal output noted most at the inferior aspect of the incision). Mild sosft tissue swelling. No erythema or ecchymosis. SLR with minimal support. Homan test is negative. ROM shows passive full extension and about 70 degrees flexion. Neurologic: Awake, alert, and oriented. Sensory and motor function are grossly intact.    Wound cultures: No growth. PT notes reviewed.  Assessment/Plan: 2 Days Post-Op Procedure(s) (LRB): TOTAL KNEE REVISION (Left) IRRIGATION AND DEBRIDEMENT KNEE (Left) Procedure(s) (LRB): TOTAL KNEE REVISION (Left) IRRIGATION AND DEBRIDEMENT KNEE (Left) Past Medical History:  Diagnosis Date   Anemia    Anxiety    Arthritis    Avascular necrosis of bones of both hips (HCC)    Bipolar disorder (HCC)    Bladder spasms    Chronic pain of left knee    Chronic pain syndrome    a.) followed by pain clinic   Cocaine use disorder, moderate, dependence (HCC) 09/09/2015   DDD (degenerative disc disease)    Depression    Erectile dysfunction    a.) on PDE5i (sildenafil)   Frequency-urgency syndrome    GERD (gastroesophageal reflux disease)    Hepatitis C 2018   History of hepatitis B    1983  TX'D--  NO ISSUES OR SYMPTOMS SINCE   Hyperlipidemia    Hypotestosteronemia    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   Weight as of this encounter: 88.5 kg. Patient will continue with physical therapy at home, continue to work on range of motion and strengthening -Home PT  Patient has been sent home with medications including celecoxib to be taken twice daily to help with any inflammation and swelling.  He is also been sent home with tramadol and oxycodone.  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  Patient was also sent home with a prescription for Duricef 1000 mg PO BID (as per recommendations from Dr. Rivka Safer)  Will send home with woundVac. Urged to continue to wear TED hose and use Polar Care  DVT Prophylaxis - Aspirin 81 mg BID and TED hose  Follow up - in 2 and 6 weeks with [facility]  Activity - WBAT Disposition - Home Condition Upon Discharge - Good  Danise Edge, PA-C Orthopaedic Surgery 06/03/2022, 9:21 AM

## 2022-06-03 NOTE — Progress Notes (Signed)
Physical Therapy Treatment Patient Details Name: Erik Martinez MRN: [id number] DOB: 1955/06/25 Today's Date: 06/03/2022   History of Present Illness Pt admitted for L TK revision with I&D. PMH of chronic pain syndrome, avasculay necrosis of bilateral hips, bipolar disorder, neurogenic bladder, GERD, Hep C, hep B, DM, anxiety, HLD.    PT Comments    Pt alert, reported L knee pain as 5-6/10, requested pain medication at end of session, RN notified. Bed mobility modI, sit <> stand with CGA-supervision. Attempted transfers and ambulation with loftstrand crutches but did not take more than 1-2 steps. Pt reported not enough support for LLE, RW used for remainder of session with supervision. He ambulated ~116ft with RW and AAROM for L knee flexion. The patient would benefit from further skilled PT intervention to continue to progress towards goals.      Recommendations for follow up therapy are one component of a multi-disciplinary discharge planning process, led by the attending physician.  Recommendations may be updated based on patient status, additional functional criteria and insurance authorization.  Follow Up Recommendations       Assistance Recommended at Discharge PRN  Patient can return home with the following Assist for transportation;Help with stairs or ramp for entrance   Equipment Recommendations  None recommended by PT    Recommendations for Other Services       Precautions / Restrictions Precautions Precautions: Fall;Knee Precaution Booklet Issued: Yes (comment) Restrictions Weight Bearing Restrictions: Yes LLE Weight Bearing: Weight bearing as tolerated     Mobility  Bed Mobility Overal bed mobility: Modified Independent                  Transfers Overall transfer level: Needs assistance Equipment used: Rolling walker (2 wheels), Lofstrands Transfers: Sit to/from Stand Sit to Stand: Min guard, Supervision           General transfer comment: CGA  with loftstrands, supervision with RW    Ambulation/Gait Ambulation/Gait assistance: Supervision, Min guard Gait Distance (Feet): 150 Feet Assistive device: Rolling walker (2 wheels), Lofstrands         General Gait Details: 1-2 steps with loftstrand crutches but pt reported not feeling supported enough. remainder of session with RW   Stairs             Wheelchair Mobility    Modified Rankin (Stroke Patients Only)       Balance Overall balance assessment: Needs assistance Sitting-balance support: Feet supported, No upper extremity supported Sitting balance-Leahy Scale: Normal Sitting balance - Comments: steady sitting, reaching outside BOS.     Standing balance-Leahy Scale: Good                              Cognition Arousal/Alertness: Awake/alert Behavior During Therapy: WFL for tasks assessed/performed Overall Cognitive Status: Within Functional Limits for tasks assessed                                          Exercises Total Joint Exercises Heel Slides: AAROM, Strengthening, Left, 15 reps Goniometric ROM: 0-57    General Comments        Pertinent Vitals/Pain Pain Assessment Pain Assessment: 0-10 Pain Score: 6  Pain Location: LLE Pain Descriptors / Indicators: Aching, Sore, Grimacing, Guarding Pain Intervention(s): Repositioned, Limited activity within patient's tolerance, Patient requesting pain meds-RN notified, Ice applied  Home Living Family/patient expects to be discharged to:: Private residence Living Arrangements: Alone                      Prior Function            PT Goals (current goals can now be found in the care plan section) Progress towards PT goals: Progressing toward goals    Frequency    BID      PT Plan Current plan remains appropriate    Co-evaluation              AM-PAC PT "6 Clicks" Mobility   Outcome Measure  Help needed turning from your back to your side  while in a flat bed without using bedrails?: None Help needed moving from lying on your back to sitting on the side of a flat bed without using bedrails?: None Help needed moving to and from a bed to a chair (including a wheelchair)?: None Help needed standing up from a chair using your arms (e.g., wheelchair or bedside chair)?: None Help needed to walk in hospital room?: None Help needed climbing 3-5 steps with a railing? : A Little 6 Click Score: 23    End of Session   Activity Tolerance: Patient tolerated treatment well Patient left: in chair;with call bell/phone within reach;with nursing/sitter in room Nurse Communication: Mobility status PT Visit Diagnosis: Other abnormalities of gait and mobility (R26.89);Muscle weakness (generalized) (M62.81);Difficulty in walking, not elsewhere classified (R26.2);Pain Pain - Right/Left: Left Pain - part of body: Knee     Time: 1610-9604 PT Time Calculation (min) (ACUTE ONLY): 18 min  Charges:  $Therapeutic Activity: 8-22 mins                     Olga Coaster PT, DPT 10:42 AM,06/03/22

## 2022-06-03 NOTE — Plan of Care (Signed)
Patient continues to progress towards discharge goals

## 2022-06-04 LAB — AEROBIC/ANAEROBIC CULTURE W GRAM STAIN (SURGICAL/DEEP WOUND)
Culture: NO GROWTH
Culture: NO GROWTH
Gram Stain: NONE SEEN
Gram Stain: NONE SEEN

## 2022-06-05 LAB — AEROBIC/ANAEROBIC CULTURE W GRAM STAIN (SURGICAL/DEEP WOUND)

## 2022-06-06 DIAGNOSIS — M5136 Other intervertebral disc degeneration, lumbar region: Secondary | ICD-10-CM | POA: Diagnosis not present

## 2022-06-06 DIAGNOSIS — B9561 Methicillin susceptible Staphylococcus aureus infection as the cause of diseases classified elsewhere: Secondary | ICD-10-CM | POA: Diagnosis not present

## 2022-06-06 DIAGNOSIS — N319 Neuromuscular dysfunction of bladder, unspecified: Secondary | ICD-10-CM | POA: Diagnosis not present

## 2022-06-06 DIAGNOSIS — I70219 Atherosclerosis of native arteries of extremities with intermittent claudication, unspecified extremity: Secondary | ICD-10-CM | POA: Diagnosis not present

## 2022-06-06 DIAGNOSIS — D649 Anemia, unspecified: Secondary | ICD-10-CM | POA: Diagnosis not present

## 2022-06-06 DIAGNOSIS — F4310 Post-traumatic stress disorder, unspecified: Secondary | ICD-10-CM | POA: Diagnosis not present

## 2022-06-06 DIAGNOSIS — E1151 Type 2 diabetes mellitus with diabetic peripheral angiopathy without gangrene: Secondary | ICD-10-CM | POA: Diagnosis not present

## 2022-06-06 DIAGNOSIS — F411 Generalized anxiety disorder: Secondary | ICD-10-CM | POA: Diagnosis not present

## 2022-06-06 DIAGNOSIS — K219 Gastro-esophageal reflux disease without esophagitis: Secondary | ICD-10-CM | POA: Diagnosis not present

## 2022-06-06 DIAGNOSIS — J449 Chronic obstructive pulmonary disease, unspecified: Secondary | ICD-10-CM | POA: Diagnosis not present

## 2022-06-06 DIAGNOSIS — M87851 Other osteonecrosis, right femur: Secondary | ICD-10-CM | POA: Diagnosis not present

## 2022-06-06 DIAGNOSIS — G894 Chronic pain syndrome: Secondary | ICD-10-CM | POA: Diagnosis not present

## 2022-06-06 DIAGNOSIS — G4700 Insomnia, unspecified: Secondary | ICD-10-CM | POA: Diagnosis not present

## 2022-06-06 DIAGNOSIS — F319 Bipolar disorder, unspecified: Secondary | ICD-10-CM | POA: Diagnosis not present

## 2022-06-06 DIAGNOSIS — F40240 Claustrophobia: Secondary | ICD-10-CM | POA: Diagnosis not present

## 2022-06-06 DIAGNOSIS — I2510 Atherosclerotic heart disease of native coronary artery without angina pectoris: Secondary | ICD-10-CM | POA: Diagnosis not present

## 2022-06-06 DIAGNOSIS — N529 Male erectile dysfunction, unspecified: Secondary | ICD-10-CM | POA: Diagnosis not present

## 2022-06-06 DIAGNOSIS — E1169 Type 2 diabetes mellitus with other specified complication: Secondary | ICD-10-CM | POA: Diagnosis not present

## 2022-06-06 DIAGNOSIS — T8454XA Infection and inflammatory reaction due to internal left knee prosthesis, initial encounter: Secondary | ICD-10-CM | POA: Diagnosis not present

## 2022-06-06 DIAGNOSIS — M47812 Spondylosis without myelopathy or radiculopathy, cervical region: Secondary | ICD-10-CM | POA: Diagnosis not present

## 2022-06-06 DIAGNOSIS — K760 Fatty (change of) liver, not elsewhere classified: Secondary | ICD-10-CM | POA: Diagnosis not present

## 2022-06-06 DIAGNOSIS — E785 Hyperlipidemia, unspecified: Secondary | ICD-10-CM | POA: Diagnosis not present

## 2022-06-06 DIAGNOSIS — Z792 Long term (current) use of antibiotics: Secondary | ICD-10-CM | POA: Diagnosis not present

## 2022-06-06 DIAGNOSIS — E1142 Type 2 diabetes mellitus with diabetic polyneuropathy: Secondary | ICD-10-CM | POA: Diagnosis not present

## 2022-06-06 LAB — AEROBIC/ANAEROBIC CULTURE W GRAM STAIN (SURGICAL/DEEP WOUND)
Culture: NO GROWTH
Culture: NO GROWTH
Gram Stain: NONE SEEN
Gram Stain: NONE SEEN

## 2022-06-07 ENCOUNTER — Telehealth: Payer: Self-pay | Admitting: *Deleted

## 2022-06-07 ENCOUNTER — Ambulatory Visit: Payer: PPO | Admitting: Infectious Diseases

## 2022-06-07 LAB — AEROBIC/ANAEROBIC CULTURE W GRAM STAIN (SURGICAL/DEEP WOUND): Culture: NO GROWTH

## 2022-06-07 NOTE — Transitions of Care (Post Inpatient/ED Visit) (Signed)
06/07/2022  Name: Erik Martinez MRN: [id number] DOB: 17-Oct-1955  Today's TOC FU Call Status: Today's TOC FU Call Status:: Successful TOC FU Call Competed TOC FU Call Complete Date: 06/07/22  Transition Care Management Follow-up Telephone Call Date of Discharge: 06/03/22 Discharge Facility: [facility]) Type of Discharge: Inpatient Admission Primary Inpatient Discharge Diagnosis:: History of revision of total knee arthroplasty How have you been since you were released from the hospital?: Better Any questions or concerns?: Yes Patient Questions/Concerns:: i have had @ 200 cc drain from my wd vac . I explained that he amt is expected to increase, but let his orhopedic know the amt draining Patient Questions/Concerns Addressed: Notified Provider of Patient Questions/Concerns  Items Reviewed: Did you receive and understand the discharge instructions provided?: Yes Medications obtained,verified, and reconciled?: Yes (Medications Reviewed) Any new allergies since your discharge?: No Dietary orders reviewed?: No Do you have support at home?: Yes People in Home: alone Name of Support/Comfort Primary Source: Ronnie friend  Medications Reviewed Today: Medications Reviewed Today     Reviewed by Luella Cook, RN (Case Manager) on 06/07/22 at 1534  Med List Status: <None>   Medication Order Taking? Sig Documenting Provider Last Dose Status Informant  acetaminophen (TYLENOL) 500 MG tablet [id number] Yes Take 1,500 mg by mouth 2 (two) times daily as needed for moderate pain or mild pain. [provider] Taking Active Self  aspirin EC 81 MG tablet [id number] Yes Take 1 tablet (81 mg total) by mouth in the morning and at bedtime. Swallow whole. Rayburn Go, PA-C Taking Active   atorvastatin (LIPITOR) 20 MG tablet [id number] Yes Take 20 mg by mouth at bedtime. [provider] Taking Active Self  cefadroxil (DURICEF) 500 MG capsule [id number]  Yes Take 2 capsules (1,000 mg total) by mouth 2 (two) times daily. Rayburn Go, PA-C Taking Active   celecoxib (CELEBREX) 200 MG capsule [id number] Yes Take 1 capsule (200 mg total) by mouth 2 (two) times daily. Rayburn Go, PA-C Taking Active   cyclobenzaprine (FLEXERIL) 10 MG tablet [id number] Yes 3 (three) times daily. [provider] Taking Active Self  empagliflozin (JARDIANCE) 25 MG TABS tablet [id number] Yes Take 1 tablet (25 mg total) by mouth daily. [name], Eula Fried, FNP Taking Active Self  ferrous sulfate 324 MG TBEC [id number] Yes TAKE ONE TABLET BY MOUTH MONDAY,WEDNESDAY,FRIDAY [provider] Taking Active Self  lidocaine (LIDODERM) 5 % [id number] Yes Place 1 patch onto the skin daily. Remove & Discard patch within 12 hours or as directed by MD  Patient taking differently: Place 1 patch onto the skin as needed. Remove & Discard patch within 12 hours or as directed by MD   [name], Eula Fried, FNP Taking Active Self, Spouse/Significant Other  metFORMIN (GLUCOPHAGE) 1000 MG tablet [id number] Yes Take 1,000 mg by mouth 2 (two) times daily. [provider] Taking Active Self  ondansetron (ZOFRAN) 4 MG tablet [id number] Yes Take 1 tablet (4 mg total) by mouth every 8 (eight) hours as needed for nausea or vomiting. Lynn Ito, MD Taking Active   oxyCODONE (OXY IR/ROXICODONE) 5 MG immediate release tablet [id number] Yes Take 1 tablet (5 mg total) by mouth every 4 (four) hours as needed for moderate pain (pain score 4-6). Rayburn Go, PA-C Taking Active   Oxycodone HCl 20 MG TABS [id number] Yes Take 1 tablet by mouth every 6 (six) hours as needed. [provider] Taking Active   OZEMPIC, 0.25 OR 0.5 MG/DOSE, 2 MG/3ML SOPN [id number] Yes Inject  0.5 mg into the skin once a week. Monday [provider] Taking Active Self  pantoprazole (PROTONIX) 40 MG tablet [id number]  Take 1 tablet (40 mg total) by mouth daily. Schnier, Latina Craver, MD   Expired 04/05/22 2359 Self  pregabalin (LYRICA) 200 MG capsule [id number] Yes Take 1 capsule (200 mg total) by mouth in the morning, at noon, and at bedtime. Excell Seltzer, MD Taking Active   tadalafil (CIALIS) 20 MG tablet [id number] Yes Take 20 mg by mouth daily as needed for erectile dysfunction. [provider] Taking Active   tiZANidine (ZANAFLEX) 4 MG tablet [id number] Yes Take 4 mg by mouth 3 (three) times daily as needed. [provider] Taking Active   traMADol (ULTRAM) 50 MG tablet [id number] Yes Take 1-2 tablets (50-100 mg total) by mouth every 4 (four) hours as needed for moderate pain. Rayburn Go, PA-C Taking Active   zolpidem (AMBIEN) 10 MG tablet [id number] Yes Take 1 tablet (10 mg total) by mouth at bedtime as needed. for sleep Excell Seltzer, MD Taking Active             Home Care and Equipment/Supplies: Were Home Health Services Ordered?: Yes Name of Home Health Agency:: [facility] Has Agency set up a time to come to your home?: No EMR reviewed for Home Health Orders: Orders present/patient has not received call (refer to CM for follow-up) Any new equipment or medical supplies ordered?: Yes Name of Medical supply agency?: [facility] ([facility]. Patient declined)  Functional Questionnaire: Do you need assistance with bathing/showering or dressing?: Yes Do you need assistance with eating?: No Do you have difficulty maintaining continence: No Do you need assistance with getting out of bed/getting out of a chair/moving?: No Do you have difficulty managing or taking your medications?: No  Follow up appointments reviewed: PCP Follow-up appointment confirmed?: NA Specialist Hospital Follow-up appointment confirmed?: Yes Date of Specialist follow-up appointment?: 06/16/22 Follow-Up Specialty Provider:: [id number] 10:15 Dr Rivka Safer, [id number] Rayburn Go, [id number] Dr Ernest Pine Do you need transportation to your follow-up appointment?: No Do you understand  care options if your condition(s) worsen?: Yes-patient verbalized understanding  SDOH Interventions Today    Flowsheet Row Most Recent Value  SDOH Interventions   Food Insecurity Interventions Intervention Not Indicated  Housing Interventions Intervention Not Indicated  Transportation Interventions Intervention Not Indicated      Interventions Today    Flowsheet Row Most Recent Value  General Interventions   General Interventions Discussed/Reviewed General Interventions Reviewed, General Interventions Discussed, Doctor Visits, Durable Medical Equipment (DME)  [Patien declined DME [facility]]  Doctor Visits Discussed/Reviewed Doctor Visits Discussed, Doctor Visits Reviewed, Specialist  PCP/Specialist Visits Compliance with follow-up visit  Pharmacy Interventions   Pharmacy Dicussed/Reviewed Pharmacy Topics Discussed, Pharmacy Topics Reviewed      TOC Interventions Today    Flowsheet Row Most Recent Value  TOC Interventions   TOC Interventions Discussed/Reviewed TOC Interventions Discussed, TOC Interventions Reviewed       Gean Maidens BSN RN [facility] [phone]

## 2022-06-16 ENCOUNTER — Ambulatory Visit: Payer: PPO | Admitting: Infectious Diseases

## 2022-06-16 DIAGNOSIS — Z96652 Presence of left artificial knee joint: Secondary | ICD-10-CM | POA: Diagnosis not present

## 2022-06-17 ENCOUNTER — Other Ambulatory Visit: Payer: Self-pay | Admitting: Family Medicine

## 2022-06-17 DIAGNOSIS — Z981 Arthrodesis status: Secondary | ICD-10-CM | POA: Diagnosis not present

## 2022-06-17 DIAGNOSIS — G4700 Insomnia, unspecified: Secondary | ICD-10-CM

## 2022-06-17 DIAGNOSIS — M5136 Other intervertebral disc degeneration, lumbar region: Secondary | ICD-10-CM | POA: Diagnosis not present

## 2022-06-17 DIAGNOSIS — M545 Low back pain, unspecified: Secondary | ICD-10-CM | POA: Diagnosis not present

## 2022-06-17 DIAGNOSIS — Z79891 Long term (current) use of opiate analgesic: Secondary | ICD-10-CM | POA: Diagnosis not present

## 2022-06-17 DIAGNOSIS — G890 Central pain syndrome: Secondary | ICD-10-CM | POA: Diagnosis not present

## 2022-06-17 DIAGNOSIS — Z79899 Other long term (current) drug therapy: Secondary | ICD-10-CM | POA: Diagnosis not present

## 2022-06-17 DIAGNOSIS — M4802 Spinal stenosis, cervical region: Secondary | ICD-10-CM | POA: Diagnosis not present

## 2022-06-17 DIAGNOSIS — M160 Bilateral primary osteoarthritis of hip: Secondary | ICD-10-CM | POA: Diagnosis not present

## 2022-06-17 DIAGNOSIS — G894 Chronic pain syndrome: Secondary | ICD-10-CM | POA: Diagnosis not present

## 2022-06-17 DIAGNOSIS — M25512 Pain in left shoulder: Secondary | ICD-10-CM | POA: Diagnosis not present

## 2022-06-17 MED ORDER — ZOLPIDEM TARTRATE 10 MG PO TABS: 10.0000 mg | ORAL_TABLET | Freq: Every evening | ORAL | 2 refills | Status: DC | PRN

## 2022-06-17 NOTE — Telephone Encounter (Signed)
Prescription Request  06/17/2022  LOV: 04/22/2022  What is the name of the medication or equipment? zolpidem (AMBIEN) 10 MG tablet   Have you contacted your pharmacy to request a refill? No   Which pharmacy would you like this sent to?  [facility] [address], [place] - [address] [place] [address] Phone: [phone] Fax: [phone]    Patient notified that their request is being sent to the clinical staff for review and that they should receive a response within 2 business days.   Please advise at Mobile [phone] (mobile)  Patient is running low on this medication

## 2022-06-17 NOTE — Telephone Encounter (Signed)
Refill for zolpidem (AMBIEN) 10 MG tablet   LOV: 04/22/2022  NV- Not scheduled LR- 03/18/22 ( 30 tabs/ 2 refills)

## 2022-06-20 DIAGNOSIS — H524 Presbyopia: Secondary | ICD-10-CM | POA: Diagnosis not present

## 2022-06-20 DIAGNOSIS — J449 Chronic obstructive pulmonary disease, unspecified: Secondary | ICD-10-CM | POA: Diagnosis not present

## 2022-06-20 DIAGNOSIS — E119 Type 2 diabetes mellitus without complications: Secondary | ICD-10-CM | POA: Diagnosis not present

## 2022-06-20 DIAGNOSIS — B9561 Methicillin susceptible Staphylococcus aureus infection as the cause of diseases classified elsewhere: Secondary | ICD-10-CM | POA: Diagnosis not present

## 2022-06-20 DIAGNOSIS — H2513 Age-related nuclear cataract, bilateral: Secondary | ICD-10-CM | POA: Diagnosis not present

## 2022-06-20 DIAGNOSIS — H25013 Cortical age-related cataract, bilateral: Secondary | ICD-10-CM | POA: Diagnosis not present

## 2022-06-20 DIAGNOSIS — G894 Chronic pain syndrome: Secondary | ICD-10-CM | POA: Diagnosis not present

## 2022-06-20 DIAGNOSIS — I70219 Atherosclerosis of native arteries of extremities with intermittent claudication, unspecified extremity: Secondary | ICD-10-CM | POA: Diagnosis not present

## 2022-06-20 DIAGNOSIS — T8454XA Infection and inflammatory reaction due to internal left knee prosthesis, initial encounter: Secondary | ICD-10-CM | POA: Diagnosis not present

## 2022-06-20 DIAGNOSIS — M87851 Other osteonecrosis, right femur: Secondary | ICD-10-CM | POA: Diagnosis not present

## 2022-06-20 DIAGNOSIS — E1151 Type 2 diabetes mellitus with diabetic peripheral angiopathy without gangrene: Secondary | ICD-10-CM | POA: Diagnosis not present

## 2022-06-21 ENCOUNTER — Ambulatory Visit: Payer: PPO | Attending: Infectious Diseases | Admitting: Infectious Diseases

## 2022-06-21 ENCOUNTER — Other Ambulatory Visit
Admission: RE | Admit: 2022-06-21 | Discharge: 2022-06-21 | Disposition: A | Discharge status: Home / Self Care | Payer: PPO | Source: Ambulatory Visit | Attending: Infectious Diseases | Admitting: Infectious Diseases

## 2022-06-21 VITALS — BP 143/76

## 2022-06-21 DIAGNOSIS — Z7985 Long-term (current) use of injectable non-insulin antidiabetic drugs: Secondary | ICD-10-CM | POA: Diagnosis not present

## 2022-06-21 DIAGNOSIS — N39498 Other specified urinary incontinence: Secondary | ICD-10-CM | POA: Diagnosis not present

## 2022-06-21 DIAGNOSIS — E1151 Type 2 diabetes mellitus with diabetic peripheral angiopathy without gangrene: Secondary | ICD-10-CM | POA: Insufficient documentation

## 2022-06-21 DIAGNOSIS — T8454XA Infection and inflammatory reaction due to internal left knee prosthesis, initial encounter: Secondary | ICD-10-CM | POA: Diagnosis not present

## 2022-06-21 DIAGNOSIS — T8450XD Infection and inflammatory reaction due to unspecified internal joint prosthesis, subsequent encounter: Secondary | ICD-10-CM

## 2022-06-21 DIAGNOSIS — E785 Hyperlipidemia, unspecified: Secondary | ICD-10-CM | POA: Diagnosis not present

## 2022-06-21 DIAGNOSIS — Z96652 Presence of left artificial knee joint: Secondary | ICD-10-CM | POA: Insufficient documentation

## 2022-06-21 DIAGNOSIS — T8454XD Infection and inflammatory reaction due to internal left knee prosthesis, subsequent encounter: Secondary | ICD-10-CM

## 2022-06-21 DIAGNOSIS — Z960 Presence of urogenital implants: Secondary | ICD-10-CM | POA: Insufficient documentation

## 2022-06-21 DIAGNOSIS — Z79899 Other long term (current) drug therapy: Secondary | ICD-10-CM | POA: Insufficient documentation

## 2022-06-21 DIAGNOSIS — B9561 Methicillin susceptible Staphylococcus aureus infection as the cause of diseases classified elsewhere: Secondary | ICD-10-CM | POA: Diagnosis not present

## 2022-06-21 DIAGNOSIS — Z8619 Personal history of other infectious and parasitic diseases: Secondary | ICD-10-CM | POA: Diagnosis not present

## 2022-06-21 DIAGNOSIS — Z7984 Long term (current) use of oral hypoglycemic drugs: Secondary | ICD-10-CM | POA: Insufficient documentation

## 2022-06-21 DIAGNOSIS — I10 Essential (primary) hypertension: Secondary | ICD-10-CM | POA: Diagnosis not present

## 2022-06-21 DIAGNOSIS — Z96642 Presence of left artificial hip joint: Secondary | ICD-10-CM | POA: Diagnosis not present

## 2022-06-21 DIAGNOSIS — Z792 Long term (current) use of antibiotics: Secondary | ICD-10-CM | POA: Insufficient documentation

## 2022-06-21 LAB — CBC WITH DIFFERENTIAL/PLATELET
Abs Immature Granulocytes: 0.02 10*3/uL (ref 0.00–0.07)
Basophils Absolute: 0 10*3/uL (ref 0.0–0.1)
Basophils Relative: 1 %
Eosinophils Absolute: 0.3 10*3/uL (ref 0.0–0.5)
Eosinophils Relative: 6 %
HCT: 27.2 % — ABNORMAL LOW (ref 39.0–52.0)
Hemoglobin: 7.8 g/dL — ABNORMAL LOW (ref 13.0–17.0)
Immature Granulocytes: 0 %
Lymphocytes Relative: 24 %
Lymphs Abs: 1.2 10*3/uL (ref 0.7–4.0)
MCH: 23.9 pg — ABNORMAL LOW (ref 26.0–34.0)
MCHC: 28.7 g/dL — ABNORMAL LOW (ref 30.0–36.0)
MCV: 83.2 fL (ref 80.0–100.0)
Monocytes Absolute: 0.4 10*3/uL (ref 0.1–1.0)
Monocytes Relative: 8 %
Neutro Abs: 3 10*3/uL (ref 1.7–7.7)
Neutrophils Relative %: 61 %
Platelets: 202 10*3/uL (ref 150–400)
RBC: 3.27 MIL/uL — ABNORMAL LOW (ref 4.22–5.81)
RDW: 15.3 % (ref 11.5–15.5)
WBC: 5 10*3/uL (ref 4.0–10.5)
nRBC: 0 % (ref 0.0–0.2)

## 2022-06-21 LAB — COMPREHENSIVE METABOLIC PANEL
ALT: 11 U/L (ref 0–44)
AST: 16 U/L (ref 15–41)
Albumin: 3.8 g/dL (ref 3.5–5.0)
Alkaline Phosphatase: 95 U/L (ref 38–126)
Anion gap: 9 (ref 5–15)
BUN: 21 mg/dL (ref 8–23)
CO2: 23 mmol/L (ref 22–32)
Calcium: 8.3 mg/dL — ABNORMAL LOW (ref 8.9–10.3)
Chloride: 100 mmol/L (ref 98–111)
Creatinine, Ser: 1.09 mg/dL (ref 0.61–1.24)
GFR, Estimated: >60 mL/min (ref >60)
Glucose, Bld: 159 mg/dL — ABNORMAL HIGH (ref 70–99)
Potassium: 4.7 mmol/L (ref 3.5–5.1)
Sodium: 132 mmol/L — ABNORMAL LOW (ref 135–145)
Total Bilirubin: 0.6 mg/dL (ref 0.3–1.2)
Total Protein: 7.7 g/dL (ref 6.5–8.1)

## 2022-06-21 LAB — SEDIMENTATION RATE: Sed Rate: 70 mm/hr — ABNORMAL HIGH (ref 0–20)

## 2022-06-21 LAB — C-REACTIVE PROTEIN: CRP: 3.1 mg/dL — ABNORMAL HIGH (ref <1.0)

## 2022-06-21 NOTE — Progress Notes (Unsigned)
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  Cultures were MSSA. He is on continuous IV infusion of nafcillin. ? Erik Martinez is a 67 y.o. 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 He had some swelling and went to see ortho on 09/08/21- asked to take NSAID and labs ordered. On 9/1 patient called ortho as he had severe drainage of yellow, purulent stuff from the surgical site. A culture was taken and he was asked to get admitted On 09/11/21 he underwent I/D of left knee with polyethylene exchange and placement of antibiotic beads Culture positive for MSSA He underwent washout and exchange of the femoral component and liner- HE was sent on Iv cefazolin for a total of [redacted] weeks along with rifampin 300mg  PO BID because of retained hardware HE finished 6 weeks on 10/22/21 and went on  Po antibiotics He was not compliant.  There was recurrence of the infection.  And he was hospitalized in March 2024 and underwent surgery.  He is feeling better now He is waiting to get his second surgery He will finish 6 weeks of nafcillin on 05/13/2022 He has a follow-up appointment with Dr. Ernest Pine on 05/12/2022 I Past Medical History:  Diagnosis Date   Anemia    Anxiety    Arthritis    Avascular necrosis of bones of both hips (HCC)    Bipolar disorder (HCC)    Bladder spasms    Chronic pain of left knee    Chronic pain syndrome    a.) followed by pain clinic   Cocaine  use disorder, moderate, dependence (HCC) 09/09/2015   DDD (degenerative disc disease)    Depression    Erectile dysfunction    a.) on PDE5i (sildenafil)   Frequency-urgency syndrome    GERD (gastroesophageal reflux disease)    Hepatitis C 2018   History of hepatitis B    1983  TX'D--  NO ISSUES OR SYMPTOMS SINCE   Hyperlipidemia    Hypotestosteronemia    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   Neurogenic bladder    Nocturia    PAD (peripheral artery disease) (HCC)    Polysubstance abuse (HCC)    a.) cocaine + marijuana + BZO + opioids   PONV (postoperative nausea and vomiting)    "difficult to put me to sleep"   Spinal headache    T2DM (type 2 diabetes mellitus) (HCC)    Urine incontinence     Past Surgical History:  Procedure Laterality Date   APPLICATION OF WOUND VAC Left 09/11/2021   Procedure: APPLICATION OF WOUND VAC;  Surgeon: Donato Heinz, MD;  Location: [facility];  Service: Orthopedics;  Laterality: Left;  [id number]    BACK SURGERY     2 rods and 4 screws artificial disc   COLONOSCOPY  2008   INTERSTIM IMPLANT PLACEMENT  2007   INTERSTIM IMPLANT REVISION N/A 08/17/2012   Procedure: REPLACMENT OF IPG PLUS REPLACE LEAD  OF INTERSTIM IMPLANT ;  Surgeon: Martina Sinner, MD;  Location: [facility];  Service: Urology;  Laterality: N/A;   IRRIGATION AND DEBRIDEMENT KNEE Left 09/11/2021   Procedure: IRRIGATION AND DEBRIDEMENT WITH POLY EXCHANGE LEFT KNEE;  Surgeon: Donato Heinz, MD;  Location: [facility];  Service: Orthopedics;  Laterality: Left;   IRRIGATION AND DEBRIDEMENT KNEE Left 06/01/2022   Procedure: IRRIGATION AND DEBRIDEMENT KNEE;  Surgeon: Donato Heinz, MD;  Location: [facility];  Service: Orthopedics;  Laterality: Left;   LOWER EXTREMITY ANGIOGRAPHY Left 11/17/2020   Procedure: LOWER EXTREMITY ANGIOGRAPHY;  Surgeon: Renford Dills, MD;  Location: [facility];  Service: Cardiovascular;  Laterality: Left;   LOWER EXTREMITY ANGIOGRAPHY  Right 12/22/2020   Procedure: LOWER EXTREMITY ANGIOGRAPHY;  Surgeon: Renford Dills, MD;  Location: [facility];  Service: Cardiovascular;  Laterality: Right;   LOWER EXTREMITY ANGIOGRAPHY Left 12/14/2021   Procedure: Lower Extremity Angiography;  Surgeon: Renford Dills, MD;  Location: [facility];  Service: Cardiovascular;  Laterality: Left;   LUMBAR DISC SURGERY  2005   L5   LUMBAR FUSION  X2  2006  &  2007   MANIPULATION KNEE JOINT Left 08/2002   OPEN DEBRIDEMENT LEFT TOTAL KNEE (SCAR, BONEY GRAOWTH)/ REMOVAL OLD SUTURES  05/01/2007   PARTIAL HIP ARTHROPLASTY Left 2001   x2   SHOULDER OPEN ROTATOR CUFF REPAIR Left 2000   TOTAL HIP ARTHROPLASTY Left 2002   TOTAL KNEE ARTHROPLASTY Left 06/2002   PARTIAL LEFT KNEE REPLACEMENT PRIOR TO THIS   TOTAL KNEE REVISION Left 2008   TOTAL KNEE REVISION Left 07/19/2021   Procedure: TOTAL KNEE REVISION;  Surgeon: Donato Heinz, MD;  Location: [facility];  Service: Orthopedics;  Laterality: Left;   TOTAL KNEE REVISION Left 2005   TOTAL KNEE REVISION Left 04/01/2022   Procedure: Removal of left total knee implants with insertion of spacer.;  Surgeon: Donato Heinz, MD;  Location: [facility];  Service: Orthopedics;  Laterality: Left;   TOTAL KNEE REVISION Left 06/01/2022   Procedure: TOTAL KNEE REVISION;  Surgeon: Donato Heinz, MD;  Location: [facility];  Service: Orthopedics;  Laterality: Left;   TRIGGER FINGER RELEASE Bilateral    SEVERAL FINGERS   TRIGGER FINGER RELEASE Right 06/18/2014   Procedure: RELEASE TRIGGER FINGER/A-1 PULLEY;  Surgeon: Donato Heinz, MD;  Location: [facility];  Service: Orthopedics;  Laterality: Right;    Social History   Socioeconomic History   Marital status: Divorced    Spouse name: Not on file   Number of children: 3   Years of education: Not on file   Highest education level: Some college, no degree  Occupational History   Not on file  Tobacco Use   Smoking status: Never   Smokeless  tobacco: Never  Vaping Use   Vaping Use: Never used  Substance and Sexual Activity   Alcohol use: Not Currently    Alcohol/week: 0.0 - 2.0 standard drinks of alcohol   Drug use: Not Currently    Types: Cocaine    Comment: last + cocaine UDS 07/2016   Sexual activity: Not Currently  Other Topics Concern   Not on file  Social History Narrative    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, partime   Social Determinants of Health   Financial Resource Strain: Low Risk  (01/28/2022)   Overall Financial Resource Strain (CARDIA)    Difficulty of Paying Living Expenses: Not hard at all  Food Insecurity:  No Food Insecurity (06/07/2022)   Hunger Vital Sign    Worried About Running Out of Food in the Last Year: Never true    Ran Out of Food in the Last Year: Never true  Transportation Needs: No Transportation Needs (06/07/2022)   PRAPARE - Administrator, Civil Service (Medical): No    Lack of Transportation (Non-Medical): No  Physical Activity: Inactive (01/28/2022)   Exercise Vital Sign    Days of Exercise per Week: 0 days    Minutes of Exercise per Session: 0 min  Stress: No Stress Concern Present (01/28/2022)   [employer] of Occupational Health - Occupational Stress Questionnaire    Feeling of Stress : Not at all  Social Connections: Moderately Isolated (01/28/2022)   Social Connection and Isolation Panel [NHANES]    Frequency of Communication with Friends and Family: More than three times a week    Frequency of Social Gatherings with Friends and Family: More than three times a week    Attends Religious Services: More than 4 times per year    Active Member of [employer] or Organizations: No    Attends Banker Meetings: Never    Marital Status: Divorced  Catering manager Violence: Not At Risk (06/01/2022)   Humiliation, Afraid, Rape, and Kick questionnaire    Fear of Current or Ex-Partner: No    Emotionally Abused: No    Physically Abused: No    Sexually Abused: No    Family  History  Problem Relation Age of Onset   Diabetes Mother    Arthritis Mother    Cancer Mother        lung cancer   Hyperlipidemia Mother    Stroke Mother    Alcohol abuse Father    Cancer Father        lung   Arthritis Father    Diabetes Brother    Allergies  Allergen Reactions   Suboxone [Buprenorphine Hcl-Naloxone Hcl] Nausea And Vomiting   Tape Other (See Comments)    Whelps *Paper Tape is ok    Codeine Rash, Itching and Nausea Only   Morphine And Codeine Itching and Rash   Other Rash    Telemetry electrodes   Silicone Rash    Whelps -*Paper Tape is ok    I? Current Outpatient Medications  Medication Sig Dispense Refill   acetaminophen (TYLENOL) 500 MG tablet Take 1,500 mg by mouth 2 (two) times daily as needed for moderate pain or mild pain.     aspirin EC 81 MG tablet Take 1 tablet (81 mg total) by mouth in the morning and at bedtime. Swallow whole. 150 tablet 2   atorvastatin (LIPITOR) 20 MG tablet Take 20 mg by mouth at bedtime.     cefadroxil (DURICEF) 500 MG capsule Take 2 capsules (1,000 mg total) by mouth 2 (two) times daily. 60 capsule 3   celecoxib (CELEBREX) 200 MG capsule Take 1 capsule (200 mg total) by mouth 2 (two) times daily. 30 capsule 2   cyclobenzaprine (FLEXERIL) 10 MG tablet 3 (three) times daily.     empagliflozin (JARDIANCE) 25 MG TABS tablet Take 1 tablet (25 mg total) by mouth daily. 90 tablet 1   ferrous sulfate 324 MG TBEC TAKE ONE TABLET BY MOUTH MONDAY,WEDNESDAY,FRIDAY     lidocaine (LIDODERM) 5 % Place 1 patch onto the skin daily. Remove & Discard patch within 12 hours or as directed by MD (Patient taking differently: Place 1 patch onto the skin as needed. Remove &  Discard patch within 12 hours or as directed by MD) 30 patch 0   metFORMIN (GLUCOPHAGE) 1000 MG tablet Take 1,000 mg by mouth 2 (two) times daily.     ondansetron (ZOFRAN) 4 MG tablet Take 1 tablet (4 mg total) by mouth every 8 (eight) hours as needed for nausea or vomiting. 20  tablet 1   oxyCODONE (OXY IR/ROXICODONE) 5 MG immediate release tablet Take 1 tablet (5 mg total) by mouth every 4 (four) hours as needed for moderate pain (pain score 4-6). 30 tablet 0   Oxycodone HCl 20 MG TABS Take 1 tablet by mouth every 6 (six) hours as needed.     OZEMPIC, 0.25 OR 0.5 MG/DOSE, 2 MG/3ML SOPN Inject 0.5 mg into the skin once a week. Monday     pantoprazole (PROTONIX) 40 MG tablet Take 1 tablet (40 mg total) by mouth daily. 90 tablet 3   pregabalin (LYRICA) 200 MG capsule Take 1 capsule (200 mg total) by mouth in the morning, at noon, and at bedtime. 90 capsule 0   tadalafil (CIALIS) 20 MG tablet Take 20 mg by mouth daily as needed for erectile dysfunction.     tiZANidine (ZANAFLEX) 4 MG tablet Take 4 mg by mouth 3 (three) times daily as needed.     traMADol (ULTRAM) 50 MG tablet Take 1-2 tablets (50-100 mg total) by mouth every 4 (four) hours as needed for moderate pain. 30 tablet 0   zolpidem (AMBIEN) 10 MG tablet Take 1 tablet (10 mg total) by mouth at bedtime as needed. for sleep 30 tablet 2   No current facility-administered medications for this visit.     Abtx:  Anti-infectives (From admission, onward)    None       REVIEW OF SYSTEMS:  Const: negative fever, negative chills, negative weight loss Eyes: negative diplopia or visual changes, negative eye pain ENT: negative coryza, negative sore throat Resp: negative cough, hemoptysis, dyspnea Cards: negative for chest pain, palpitations, lower extremity edema GU: negative for frequency, dysuria and hematuria GI: Negative for abdominal pain, diarrhea, bleeding, constipation Skin: negative for rash and pruritus Heme: negative for easy bruising and gum/nose bleeding MS: pain,  stiffness left knee Neurolo:negative for headaches, dizziness, vertigo, memory problems  Psych:  anxiety Endocrine: , diabetes Allergy/Immunology- as above ? Objective:  VITALS:  There were no vitals taken for this visit.  PHYSICAL  EXAM:  General: Alert, cooperative, no distress, appears stated age.  Head: Normocephalic, without obvious abnormality, atraumatic. Eyes: Conjunctivae clear, anicteric sclerae. Pupils are equal ENT Nares normal. No drainage or sinus tenderness. Lips, mucosa, and tongue normal. No Thrush Neck: Supple, symmetrical, no adenopathy, thyroid: non tender no carotid bruit and no JVD. Back: No CVA tenderness. Lungs: Clear to auscultation bilaterally. No Wheezing or Rhonchi. No rales. Heart: Regular rate and rhythm, no murmur, rub or gallop. Abdomen: Soft, non-tender,not distended. Bowel sounds normal. No masses Extremities: left knee surgical scar healed well Skin: No rashes or lesions. Or bruising Lymph: Cervical, supraclavicular normal. Neurologic: Grossly non-focal Pertinent Labs Labs done on 04/26/2022 shows WBC of 4.1, hemoglobin 9.9, platelet 184, creatinine 1.29, sed rate 54 and C-reactive protein 14 ? Impression/Recommendation ? ?Left knee PJI with MSSA-recurrent infections of status post explantation of the prosthesis and now has an antibiotic spacer. He is getting 6 weeks of IV nafcillin and will finish on 05/13/2022. Will discuss with Dr. Ernest Pine regarding the timing of the second stage surgery  and the need for repeat aspiration of the knee versus  lab test  ESR 140> 68 > 94>54 CRP > 10.4  Treated hep C   DM on metformin and ozempic   HTN on atenolol and clonidine   HLD on atorvastatin   ? Discussed management with the patient in detail follow-up in 1 month   note:  This document was prepared using Dragon voice recognition software and may include unintentional dictation errors.

## 2022-06-23 ENCOUNTER — Telehealth: Payer: Self-pay

## 2022-06-23 ENCOUNTER — Telehealth: Payer: Self-pay | Admitting: Family Medicine

## 2022-06-23 DIAGNOSIS — Z96652 Presence of left artificial knee joint: Secondary | ICD-10-CM | POA: Diagnosis not present

## 2022-06-23 NOTE — Telephone Encounter (Signed)
Received med shipment for pt's Ozempic 0.25 mg/0.5 mg (5 boxes total).   Spoke with pt notifying him of shipment above.   [Placed Ozempic (5 boxes) in 2nd refrigerator, top shelf.]

## 2022-06-23 NOTE — Telephone Encounter (Signed)
Returned call and spoke with a different agent.  She states they were just calling to let us know his Ozempic was shipped out on 06/22/2022 and is due to be delivered today.

## 2022-06-23 NOTE — Telephone Encounter (Signed)
Cassandra from [employer] called over and wanted to speak with someone in regards to patient medication Ozempic. They can be reached at [phone]. Thank you!

## 2022-06-28 DIAGNOSIS — Z96652 Presence of left artificial knee joint: Secondary | ICD-10-CM | POA: Diagnosis not present

## 2022-06-30 DIAGNOSIS — Z96652 Presence of left artificial knee joint: Secondary | ICD-10-CM | POA: Diagnosis not present

## 2022-07-05 DIAGNOSIS — Z96652 Presence of left artificial knee joint: Secondary | ICD-10-CM | POA: Diagnosis not present

## 2022-07-07 DIAGNOSIS — Z96652 Presence of left artificial knee joint: Secondary | ICD-10-CM | POA: Diagnosis not present

## 2022-07-12 DIAGNOSIS — Z96652 Presence of left artificial knee joint: Secondary | ICD-10-CM | POA: Diagnosis not present

## 2022-07-19 DIAGNOSIS — Z96652 Presence of left artificial knee joint: Secondary | ICD-10-CM | POA: Diagnosis not present

## 2022-07-21 DIAGNOSIS — Z96652 Presence of left artificial knee joint: Secondary | ICD-10-CM | POA: Diagnosis not present

## 2022-07-26 DIAGNOSIS — Z96652 Presence of left artificial knee joint: Secondary | ICD-10-CM | POA: Diagnosis not present

## 2022-07-29 DIAGNOSIS — M5136 Other intervertebral disc degeneration, lumbar region: Secondary | ICD-10-CM | POA: Diagnosis not present

## 2022-07-29 DIAGNOSIS — Z79899 Other long term (current) drug therapy: Secondary | ICD-10-CM | POA: Diagnosis not present

## 2022-07-29 DIAGNOSIS — Z79891 Long term (current) use of opiate analgesic: Secondary | ICD-10-CM | POA: Diagnosis not present

## 2022-07-29 DIAGNOSIS — M25512 Pain in left shoulder: Secondary | ICD-10-CM | POA: Diagnosis not present

## 2022-07-29 DIAGNOSIS — M4802 Spinal stenosis, cervical region: Secondary | ICD-10-CM | POA: Diagnosis not present

## 2022-07-29 DIAGNOSIS — M160 Bilateral primary osteoarthritis of hip: Secondary | ICD-10-CM | POA: Diagnosis not present

## 2022-07-29 DIAGNOSIS — M545 Low back pain, unspecified: Secondary | ICD-10-CM | POA: Diagnosis not present

## 2022-07-29 DIAGNOSIS — Z981 Arthrodesis status: Secondary | ICD-10-CM | POA: Diagnosis not present

## 2022-07-29 DIAGNOSIS — G890 Central pain syndrome: Secondary | ICD-10-CM | POA: Diagnosis not present

## 2022-07-29 DIAGNOSIS — G894 Chronic pain syndrome: Secondary | ICD-10-CM | POA: Diagnosis not present

## 2022-08-05 ENCOUNTER — Other Ambulatory Visit: Payer: Self-pay

## 2022-08-05 ENCOUNTER — Other Ambulatory Visit: Payer: Self-pay | Admitting: Infectious Diseases

## 2022-08-05 MED ORDER — CEFADROXIL 500 MG PO CAPS: 1000.0000 mg | ORAL_CAPSULE | Freq: Two times a day (BID) | ORAL | 3 refills | Status: DC

## 2022-08-10 ENCOUNTER — Telehealth: Payer: Self-pay

## 2022-08-10 NOTE — Telephone Encounter (Signed)
Left message on Pt VM advising him his Ozempic patient Assistance medication was here at the office ready for pickup.

## 2022-08-11 DIAGNOSIS — S76212S Strain of adductor muscle, fascia and tendon of left thigh, sequela: Secondary | ICD-10-CM | POA: Diagnosis not present

## 2022-08-11 DIAGNOSIS — S76011A Strain of muscle, fascia and tendon of right hip, initial encounter: Secondary | ICD-10-CM | POA: Diagnosis not present

## 2022-08-16 DIAGNOSIS — Z96652 Presence of left artificial knee joint: Secondary | ICD-10-CM | POA: Diagnosis not present

## 2022-08-16 NOTE — Telephone Encounter (Signed)
Left voicemail for patient to pick up Ozempic and to call the office with any questions or concerns.

## 2022-08-17 ENCOUNTER — Other Ambulatory Visit: Payer: Self-pay | Admitting: Family Medicine

## 2022-08-17 NOTE — Telephone Encounter (Signed)
Just checking the medication because there is a line through the terbinafine??

## 2022-08-17 NOTE — Telephone Encounter (Signed)
Terbinafine 250 mg is not on current medication list.  Last office visit 04/22/2022 for hospital follow up.  No future appointments with PCP.  Please advise.

## 2022-08-17 NOTE — Telephone Encounter (Signed)
No worries, this is the medication the patient said he wanted to refill. Told him that Dr. Ermalene Searing advised for him to stop taking it upon discharge. Patient said it is for toenail fungus and he wanted to try and refill it.

## 2022-08-17 NOTE — Telephone Encounter (Signed)
Prescription Request  08/17/2022  LOV: 04/22/2022  What is the name of the medication or equipment?    Have you contacted your pharmacy to request a refill? No   Which pharmacy would you like this sent to?   [facility] - [place], [place] - [address] [place] [address] Phone: [phone] Fax: [phone]    Patient notified that their request is being sent to the clinical staff for review and that they should receive a response within 2 business days.   Please advise at Mobile [phone] (mobile)

## 2022-08-18 NOTE — Telephone Encounter (Signed)
Left message for Mr. Donica to return my call.  When patient calls back, please find out:  What is he using his medication for? Toenail fungus?  How long has he been taking?

## 2022-08-18 NOTE — Telephone Encounter (Signed)
Contact patient.  What is he using his medication for?  Toenail fungus?  How long has he been taking?

## 2022-08-19 NOTE — Telephone Encounter (Signed)
Left message for Mr. Gilleland to return my call.

## 2022-08-22 NOTE — Telephone Encounter (Signed)
Left message for Erik Martinez to return my call.       I have left 3 message for patient to call back in regards to the Lamisil refill he requested.   No return call as of today 08/22/22.  Will close encounter.  Not able to refill medication until patient calls the office back.

## 2022-08-23 DIAGNOSIS — Z96652 Presence of left artificial knee joint: Secondary | ICD-10-CM | POA: Diagnosis not present

## 2022-08-26 DIAGNOSIS — G894 Chronic pain syndrome: Secondary | ICD-10-CM | POA: Diagnosis not present

## 2022-08-26 DIAGNOSIS — M160 Bilateral primary osteoarthritis of hip: Secondary | ICD-10-CM | POA: Diagnosis not present

## 2022-08-26 DIAGNOSIS — M545 Low back pain, unspecified: Secondary | ICD-10-CM | POA: Diagnosis not present

## 2022-08-26 DIAGNOSIS — M5136 Other intervertebral disc degeneration, lumbar region: Secondary | ICD-10-CM | POA: Diagnosis not present

## 2022-08-26 DIAGNOSIS — Z981 Arthrodesis status: Secondary | ICD-10-CM | POA: Diagnosis not present

## 2022-08-26 DIAGNOSIS — M4802 Spinal stenosis, cervical region: Secondary | ICD-10-CM | POA: Diagnosis not present

## 2022-08-26 DIAGNOSIS — G890 Central pain syndrome: Secondary | ICD-10-CM | POA: Diagnosis not present

## 2022-08-26 DIAGNOSIS — Z79891 Long term (current) use of opiate analgesic: Secondary | ICD-10-CM | POA: Diagnosis not present

## 2022-08-26 DIAGNOSIS — M25512 Pain in left shoulder: Secondary | ICD-10-CM | POA: Diagnosis not present

## 2022-09-13 ENCOUNTER — Other Ambulatory Visit: Payer: Self-pay | Admitting: Family Medicine

## 2022-09-13 DIAGNOSIS — G4700 Insomnia, unspecified: Secondary | ICD-10-CM

## 2022-09-13 MED ORDER — ZOLPIDEM TARTRATE 10 MG PO TABS: 10.0000 mg | ORAL_TABLET | Freq: Every evening | ORAL | 2 refills | Status: DC | PRN

## 2022-09-13 NOTE — Telephone Encounter (Signed)
Duplicate Request.

## 2022-09-13 NOTE — Telephone Encounter (Signed)
Prescription Request  09/13/2022  LOV: 04/22/2022  What is the name of the medication or equipment?  zolpidem (AMBIEN) 10 MG tablet  Have you contacted your pharmacy to request a refill? Yes    [facility] - [place], [place] - [address] [place] [address] Phone: [phone] Fax: [phone]    Patient notified that their request is being sent to the clinical staff for review and that they should receive a response within 2 business days.   Please advise at Mobile [phone] (mobile)

## 2022-09-13 NOTE — Telephone Encounter (Signed)
Last office visit 04/22/2022 for hosptial follow up.  Last refilled 06/17/2022 for #30 with 2 refills.  Next Appt: No future appointments with PCP.

## 2022-09-13 NOTE — Telephone Encounter (Signed)
Patient called in and stated that he is completely out and needing the medication.

## 2022-09-19 ENCOUNTER — Other Ambulatory Visit: Payer: Self-pay

## 2022-09-19 ENCOUNTER — Telehealth: Payer: Self-pay

## 2022-09-19 DIAGNOSIS — M160 Bilateral primary osteoarthritis of hip: Secondary | ICD-10-CM | POA: Diagnosis not present

## 2022-09-19 DIAGNOSIS — Z981 Arthrodesis status: Secondary | ICD-10-CM | POA: Diagnosis not present

## 2022-09-19 DIAGNOSIS — M545 Low back pain, unspecified: Secondary | ICD-10-CM | POA: Diagnosis not present

## 2022-09-19 DIAGNOSIS — Z96652 Presence of left artificial knee joint: Secondary | ICD-10-CM | POA: Diagnosis not present

## 2022-09-19 DIAGNOSIS — Z79891 Long term (current) use of opiate analgesic: Secondary | ICD-10-CM | POA: Diagnosis not present

## 2022-09-19 DIAGNOSIS — G890 Central pain syndrome: Secondary | ICD-10-CM | POA: Diagnosis not present

## 2022-09-19 DIAGNOSIS — M25512 Pain in left shoulder: Secondary | ICD-10-CM | POA: Diagnosis not present

## 2022-09-19 DIAGNOSIS — M5136 Other intervertebral disc degeneration, lumbar region: Secondary | ICD-10-CM | POA: Diagnosis not present

## 2022-09-19 DIAGNOSIS — M4802 Spinal stenosis, cervical region: Secondary | ICD-10-CM | POA: Diagnosis not present

## 2022-09-19 DIAGNOSIS — M25551 Pain in right hip: Secondary | ICD-10-CM | POA: Diagnosis not present

## 2022-09-19 DIAGNOSIS — G894 Chronic pain syndrome: Secondary | ICD-10-CM | POA: Diagnosis not present

## 2022-09-19 DIAGNOSIS — G8929 Other chronic pain: Secondary | ICD-10-CM | POA: Diagnosis not present

## 2022-09-19 MED ORDER — CEFADROXIL 500 MG PO CAPS: 1000.0000 mg | ORAL_CAPSULE | Freq: Two times a day (BID) | ORAL | 1 refills | Status: DC

## 2022-09-19 NOTE — Telephone Encounter (Signed)
Patient called stating that RX for cefadroxil was only for 15 capsules. Per Dr.Ravishankar last note patient is to take Cefadroxil 1 gm PO BID. Sent in new RX for 120 capsules. Patient aware of updated RX.   Rubin Dais Lesli Albee, CMA

## 2022-09-21 ENCOUNTER — Other Ambulatory Visit: Payer: Self-pay | Admitting: Physician Assistant

## 2022-09-21 DIAGNOSIS — G8929 Other chronic pain: Secondary | ICD-10-CM

## 2022-10-14 ENCOUNTER — Ambulatory Visit: Payer: PPO

## 2022-10-17 DIAGNOSIS — Z79891 Long term (current) use of opiate analgesic: Secondary | ICD-10-CM | POA: Diagnosis not present

## 2022-10-17 DIAGNOSIS — M545 Low back pain, unspecified: Secondary | ICD-10-CM | POA: Diagnosis not present

## 2022-10-17 DIAGNOSIS — G894 Chronic pain syndrome: Secondary | ICD-10-CM | POA: Diagnosis not present

## 2022-10-17 DIAGNOSIS — Z981 Arthrodesis status: Secondary | ICD-10-CM | POA: Diagnosis not present

## 2022-10-17 DIAGNOSIS — M4802 Spinal stenosis, cervical region: Secondary | ICD-10-CM | POA: Diagnosis not present

## 2022-10-17 DIAGNOSIS — M25512 Pain in left shoulder: Secondary | ICD-10-CM | POA: Diagnosis not present

## 2022-10-17 DIAGNOSIS — M5136 Other intervertebral disc degeneration, lumbar region with discogenic back pain only: Secondary | ICD-10-CM | POA: Diagnosis not present

## 2022-10-17 DIAGNOSIS — G890 Central pain syndrome: Secondary | ICD-10-CM | POA: Diagnosis not present

## 2022-10-17 DIAGNOSIS — M160 Bilateral primary osteoarthritis of hip: Secondary | ICD-10-CM | POA: Diagnosis not present

## 2022-10-20 ENCOUNTER — Telehealth: Payer: Self-pay | Admitting: Family Medicine

## 2022-10-20 NOTE — Telephone Encounter (Signed)
Last office visit 04/22/2022 for hosptial follow up. Last refilled 03/18/22 for #90 with 0 refills.  Next Appt: 10/27/22 Acute appt. Per note patient was advised to begin taking this agin by infectious disease provider

## 2022-10-20 NOTE — Telephone Encounter (Signed)
Prescription Request  10/20/2022  LOV: 04/22/2022  What is the name of the medication or equipment?   Patient was advised to begin taking this agin by infectious disease provider   Have you contacted your pharmacy to request a refill? No   Which pharmacy would you like this sent to?  [facility] - [place], [place] - [address] [place] [address] Phone: [phone] Fax: [phone]    Patient notified that their request is being sent to the clinical staff for review and that they should receive a response within 2 business days.   Please advise at Mobile [phone] (mobile)

## 2022-10-20 NOTE — Telephone Encounter (Signed)
Call patient, what is he taking this for?  Given last liver function test was in June he will need this repeated before continuing this prescription.

## 2022-10-21 NOTE — Telephone Encounter (Signed)
Left message to return call to our office.  

## 2022-10-24 NOTE — Telephone Encounter (Signed)
Left message to return call to our office.  

## 2022-10-25 ENCOUNTER — Encounter: Payer: Self-pay | Admitting: *Deleted

## 2022-10-25 ENCOUNTER — Ambulatory Visit: Payer: PPO | Attending: Infectious Diseases | Admitting: Infectious Diseases

## 2022-10-25 ENCOUNTER — Other Ambulatory Visit
Admission: RE | Admit: 2022-10-25 | Discharge: 2022-10-25 | Disposition: A | Discharge status: Home / Self Care | Payer: PPO | Source: Ambulatory Visit | Attending: Infectious Diseases | Admitting: Infectious Diseases

## 2022-10-25 ENCOUNTER — Encounter: Payer: Self-pay | Admitting: Infectious Diseases

## 2022-10-25 VITALS — BP 152/85 | HR 85 | Temp 97.0°F | Wt 191.0 lb

## 2022-10-25 DIAGNOSIS — Z7984 Long term (current) use of oral hypoglycemic drugs: Secondary | ICD-10-CM | POA: Insufficient documentation

## 2022-10-25 DIAGNOSIS — T8454XA Infection and inflammatory reaction due to internal left knee prosthesis, initial encounter: Secondary | ICD-10-CM | POA: Insufficient documentation

## 2022-10-25 DIAGNOSIS — E785 Hyperlipidemia, unspecified: Secondary | ICD-10-CM | POA: Diagnosis not present

## 2022-10-25 DIAGNOSIS — T8454XD Infection and inflammatory reaction due to internal left knee prosthesis, subsequent encounter: Secondary | ICD-10-CM

## 2022-10-25 DIAGNOSIS — Z79899 Other long term (current) drug therapy: Secondary | ICD-10-CM | POA: Insufficient documentation

## 2022-10-25 DIAGNOSIS — T8450XD Infection and inflammatory reaction due to unspecified internal joint prosthesis, subsequent encounter: Secondary | ICD-10-CM | POA: Diagnosis not present

## 2022-10-25 DIAGNOSIS — I10 Essential (primary) hypertension: Secondary | ICD-10-CM | POA: Insufficient documentation

## 2022-10-25 DIAGNOSIS — B1920 Unspecified viral hepatitis C without hepatic coma: Secondary | ICD-10-CM | POA: Insufficient documentation

## 2022-10-25 DIAGNOSIS — B9561 Methicillin susceptible Staphylococcus aureus infection as the cause of diseases classified elsewhere: Secondary | ICD-10-CM | POA: Insufficient documentation

## 2022-10-25 DIAGNOSIS — Z7985 Long-term (current) use of injectable non-insulin antidiabetic drugs: Secondary | ICD-10-CM | POA: Insufficient documentation

## 2022-10-25 DIAGNOSIS — E1151 Type 2 diabetes mellitus with diabetic peripheral angiopathy without gangrene: Secondary | ICD-10-CM | POA: Diagnosis not present

## 2022-10-25 LAB — CBC WITH DIFFERENTIAL/PLATELET
Abs Immature Granulocytes: 0.03 10*3/uL (ref 0.00–0.07)
Basophils Absolute: 0 10*3/uL (ref 0.0–0.1)
Basophils Relative: 0 %
Eosinophils Absolute: 0.1 10*3/uL (ref 0.0–0.5)
Eosinophils Relative: 2 %
HCT: 35.2 % — ABNORMAL LOW (ref 39.0–52.0)
Hemoglobin: 10.7 g/dL — ABNORMAL LOW (ref 13.0–17.0)
Immature Granulocytes: 0 %
Lymphocytes Relative: 18 %
Lymphs Abs: 1.2 10*3/uL (ref 0.7–4.0)
MCH: 22.2 pg — ABNORMAL LOW (ref 26.0–34.0)
MCHC: 30.4 g/dL (ref 30.0–36.0)
MCV: 72.9 fL — ABNORMAL LOW (ref 80.0–100.0)
Monocytes Absolute: 0.5 10*3/uL (ref 0.1–1.0)
Monocytes Relative: 8 %
Neutro Abs: 4.8 10*3/uL (ref 1.7–7.7)
Neutrophils Relative %: 72 %
Platelets: 243 10*3/uL (ref 150–400)
RBC: 4.83 MIL/uL (ref 4.22–5.81)
RDW: 18.2 % — ABNORMAL HIGH (ref 11.5–15.5)
WBC: 6.7 10*3/uL (ref 4.0–10.5)
nRBC: 0 % (ref 0.0–0.2)

## 2022-10-25 LAB — COMPREHENSIVE METABOLIC PANEL
ALT: 13 U/L (ref 0–44)
AST: 14 U/L — ABNORMAL LOW (ref 15–41)
Albumin: 3.9 g/dL (ref 3.5–5.0)
Alkaline Phosphatase: 95 U/L (ref 38–126)
Anion gap: 10 (ref 5–15)
BUN: 21 mg/dL (ref 8–23)
CO2: 23 mmol/L (ref 22–32)
Calcium: 9 mg/dL (ref 8.9–10.3)
Chloride: 103 mmol/L (ref 98–111)
Creatinine, Ser: 1.01 mg/dL (ref 0.61–1.24)
GFR, Estimated: >60 mL/min (ref >60)
Glucose, Bld: 130 mg/dL — ABNORMAL HIGH (ref 70–99)
Potassium: 4.1 mmol/L (ref 3.5–5.1)
Sodium: 136 mmol/L (ref 135–145)
Total Bilirubin: 0.3 mg/dL (ref 0.3–1.2)
Total Protein: 8.3 g/dL — ABNORMAL HIGH (ref 6.5–8.1)

## 2022-10-25 LAB — C-REACTIVE PROTEIN: CRP: 4.6 mg/dL — ABNORMAL HIGH (ref <1.0)

## 2022-10-25 LAB — SEDIMENTATION RATE: Sed Rate: 77 mm/h — ABNORMAL HIGH (ref 0–20)

## 2022-10-25 MED ORDER — CEFADROXIL 500 MG PO CAPS: 1000.0000 mg | ORAL_CAPSULE | Freq: Two times a day (BID) | ORAL | 0 refills | Status: DC

## 2022-10-25 NOTE — Patient Instructions (Signed)
VISIT SUMMARY:  During your visit, we discussed your ongoing treatment for a prosthetic joint infection and diabetes. You've had a series of surgeries related to your knee condition, including a knee replacement, a cleaning procedure, and a second knee replacement. You've been on antibiotics for five months following the reimplantation of your knee prosthesis. Your diabetes is well-managed with Metformin and Jardiance.  YOUR PLAN:  -PROSTHETIC JOINT INFECTION: You've been successfully treated for an infection in your artificial knee joint. You're currently taking an antibiotic called Cefadroxil, which you should continue. We'll also run some tests to check for any ongoing inflammation. We'll discuss the duration of your antibiotic therapy with Dr. Ernest Pine and refill your Cefadroxil prescription at [facility].  -DIABETES: Your diabetes is under control with your current medications, Metformin and Jardiance. You should continue taking these. .  INSTRUCTIONS:  Continue your current work and physical activities as you're able to. We'll follow up with you after we get your lab results and have our discussion with Dr. Ernest Pine.

## 2022-10-25 NOTE — Telephone Encounter (Signed)
Left message for Erik Martinez to return call to office.  Also sent MyChart message since we have not been able to reach him by phone.

## 2022-10-25 NOTE — Progress Notes (Signed)
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 He Underwent Explantation of the prosthetic left knee on 04/01/2022 and had a spacer.  Cultures were MSSA., followed by six weeks of IV nafcillin  =. had aspiration of the knee joint on 05/12/22 and the cultures were negative for Staph aureus at that time, So he was taken for 2nd stage of the procedure and had arthroplasty and new implant placed on 06/01/22. 4 different cultures taken at that time were all negative for staph aureus. HE is on cefadroxil 1 gram BID with a plan to give for 6 months . The patient is now five months into the antibiotic treatment. The patient's knee is not swollen and he is able to walk, although he reports some swelling towards the end of the day if he has been walking a lot. 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  The patient also has a history of diabetes, which he reports is well-managed with metformin and Jardiance. His last A1c was 6.7 and he is due for another test soon. The patient also mentions having a hip replacement and a back surgery, and he has a bladder stimulator. He has seen a doctor for back pain, but did not get an MRI as the pain subsided.     Past Medical History:  Diagnosis Date   Anemia    Anxiety    Arthritis    Avascular necrosis of bones of both hips (HCC)    Bipolar disorder (HCC)    Bladder spasms    Chronic pain of left knee     Chronic pain syndrome    a.) followed by pain clinic   Cocaine use disorder, moderate, dependence (HCC) 09/09/2015   DDD (degenerative disc disease)    Depression    Erectile dysfunction    a.) on PDE5i (sildenafil)   Frequency-urgency syndrome    GERD (gastroesophageal reflux disease)    Hepatitis C 2018   History of hepatitis B    1983  TX'D--  NO ISSUES OR SYMPTOMS SINCE   Hyperlipidemia    Hypotestosteronemia    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   Neurogenic bladder    Nocturia    PAD (peripheral artery disease) (HCC)    Polysubstance abuse (HCC)    a.) cocaine + marijuana + BZO + opioids   PONV (postoperative nausea and vomiting)    "difficult to put me to sleep"   Spinal headache    T2DM (type 2 diabetes mellitus) (HCC)    Urine incontinence     Past Surgical History:  Procedure Laterality Date   APPLICATION OF WOUND VAC Left 09/11/2021   Procedure: APPLICATION OF WOUND VAC;  Surgeon: Donato Heinz, MD;  Location: [facility];  Service: Orthopedics;  Laterality: Left;  [id number]    BACK SURGERY     2 rods and 4 screws artificial disc   COLONOSCOPY  2008   INTERSTIM IMPLANT PLACEMENT  2007   INTERSTIM IMPLANT REVISION N/A 08/17/2012   Procedure: REPLACMENT OF IPG PLUS REPLACE LEAD OF INTERSTIM IMPLANT ;  Surgeon: Martina Sinner, MD;  Location[facility];  Service: Urology;  Laterality: N/A;   IRRIGATION AND DEBRIDEMENT KNEE Left 09/11/2021   Procedure: IRRIGATION AND DEBRIDEMENT WITH POLY EXCHANGE LEFT KNEE;  Surgeon: Donato Heinz, MD;  Location: [facility];  Service: Orthopedics;  Laterality: Left;   IRRIGATION AND DEBRIDEMENT KNEE Left 06/01/2022   Procedure: IRRIGATION AND DEBRIDEMENT KNEE;  Surgeon: Donato Heinz, MD;  Location: [facility];  Service: Orthopedics;  Laterality: Left;   LOWER EXTREMITY ANGIOGRAPHY Left 11/17/2020   Procedure: LOWER EXTREMITY ANGIOGRAPHY;  Surgeon: Renford Dills, MD;  Location: [facility];   Service: Cardiovascular;  Laterality: Left;   LOWER EXTREMITY ANGIOGRAPHY Right 12/22/2020   Procedure: LOWER EXTREMITY ANGIOGRAPHY;  Surgeon: Renford Dills, MD;  Location: [facility];  Service: Cardiovascular;  Laterality: Right;   LOWER EXTREMITY ANGIOGRAPHY Left 12/14/2021   Procedure: Lower Extremity Angiography;  Surgeon: Renford Dills, MD;  Location: [facility];  Service: Cardiovascular;  Laterality: Left;   LUMBAR DISC SURGERY  2005   L5   LUMBAR FUSION  X2  2006  &  2007   MANIPULATION KNEE JOINT Left 08/2002   OPEN DEBRIDEMENT LEFT TOTAL KNEE (SCAR, BONEY GRAOWTH)/ REMOVAL OLD SUTURES  05/01/2007   PARTIAL HIP ARTHROPLASTY Left 2001   x2   SHOULDER OPEN ROTATOR CUFF REPAIR Left 2000   TOTAL HIP ARTHROPLASTY Left 2002   TOTAL KNEE ARTHROPLASTY Left 06/2002   PARTIAL LEFT KNEE REPLACEMENT PRIOR TO THIS   TOTAL KNEE REVISION Left 2008   TOTAL KNEE REVISION Left 07/19/2021   Procedure: TOTAL KNEE REVISION;  Surgeon: Donato Heinz, MD;  Location: [facility];  Service: Orthopedics;  Laterality: Left;   TOTAL KNEE REVISION Left 2005   TOTAL KNEE REVISION Left 04/01/2022   Procedure: Removal of left total knee implants with insertion of spacer.;  Surgeon: Donato Heinz, MD;  Location: [facility];  Service: Orthopedics;  Laterality: Left;   TOTAL KNEE REVISION Left 06/01/2022   Procedure: TOTAL KNEE REVISION;  Surgeon: Donato Heinz, MD;  Location: [facility];  Service: Orthopedics;  Laterality: Left;   TRIGGER FINGER RELEASE Bilateral    SEVERAL FINGERS   TRIGGER FINGER RELEASE Right 06/18/2014   Procedure: RELEASE TRIGGER FINGER/A-1 PULLEY;  Surgeon: Donato Heinz, MD;  Location: [facility];  Service: Orthopedics;  Laterality: Right;    Social History   Socioeconomic History   Marital status: Divorced    Spouse name: Not on file   Number of children: 3   Years of education: Not on file   Highest education level: Some college, no degree  Occupational  History   Not on file  Tobacco Use   Smoking status: Never   Smokeless tobacco: Never  Vaping Use   Vaping status: Never Used  Substance and Sexual Activity   Alcohol use: Not Currently    Alcohol/week: 0.0 - 2.0 standard drinks of alcohol   Drug use: Not Currently    Types: Cocaine    Comment: last + cocaine UDS 07/2016   Sexual activity: Not Currently  Other Topics Concern   Not on file  Social History Narrative    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, partime   Social Determinants of Health   Financial Resource Strain: Low Risk  (01/28/2022)   Overall Financial Resource  Strain (CARDIA)    Difficulty of Paying Living Expenses: Not hard at all  Food Insecurity: No Food Insecurity (06/07/2022)   Hunger Vital Sign    Worried About Running Out of Food in the Last Year: Never true    Ran Out of Food in the Last Year: Never true  Transportation Needs: No Transportation Needs (06/07/2022)   PRAPARE - Administrator, Civil Service (Medical): No    Lack of Transportation (Non-Medical): No  Physical Activity: Inactive (01/28/2022)   Exercise Vital Sign    Days of Exercise per Week: 0 days    Minutes of Exercise per Session: 0 min  Stress: No Stress Concern Present (01/28/2022)   [employer] of Occupational Health - Occupational Stress Questionnaire    Feeling of Stress : Not at all  Social Connections: Moderately Isolated (01/28/2022)   Social Connection and Isolation Panel [NHANES]    Frequency of Communication with Friends and Family: More than three times a week    Frequency of Social Gatherings with Friends and Family: More than three times a week    Attends Religious Services: More than 4 times per year    Active Member of [employer] or Organizations: No    Attends Banker Meetings: Never    Marital Status: Divorced  Catering manager Violence: Not At Risk (06/01/2022)   Humiliation, Afraid, Rape, and Kick questionnaire    Fear of Current or Ex-Partner: No    Emotionally  Abused: No    Physically Abused: No    Sexually Abused: No    Family History  Problem Relation Age of Onset   Diabetes Mother    Arthritis Mother    Cancer Mother        lung cancer   Hyperlipidemia Mother    Stroke Mother    Alcohol abuse Father    Cancer Father        lung   Arthritis Father    Diabetes Brother    Allergies  Allergen Reactions   Suboxone [Buprenorphine Hcl-Naloxone Hcl] Nausea And Vomiting   Tape Other (See Comments)    Whelps *Paper Tape is ok    Codeine Rash, Itching and Nausea Only   Morphine And Codeine Itching and Rash   Other Rash    Telemetry electrodes   Silicone Rash    Whelps -*Paper Tape is ok    I? Current Outpatient Medications  Medication Sig Dispense Refill   acetaminophen (TYLENOL) 500 MG tablet Take 1,500 mg by mouth 2 (two) times daily as needed for moderate pain or mild pain.     aspirin EC 81 MG tablet Take 1 tablet (81 mg total) by mouth in the morning and at bedtime. Swallow whole. 150 tablet 2   atorvastatin (LIPITOR) 20 MG tablet Take 20 mg by mouth at bedtime.     cefadroxil (DURICEF) 500 MG capsule Take 2 capsules (1,000 mg total) by mouth 2 (two) times daily. 120 capsule 1   celecoxib (CELEBREX) 200 MG capsule Take 1 capsule (200 mg total) by mouth 2 (two) times daily. 30 capsule 2   cyclobenzaprine (FLEXERIL) 10 MG tablet 3 (three) times daily.     empagliflozin (JARDIANCE) 25 MG TABS tablet Take 1 tablet (25 mg total) by mouth daily. 90 tablet 1   ferrous sulfate 324 MG TBEC TAKE ONE TABLET BY MOUTH MONDAY,WEDNESDAY,FRIDAY     Lactobacillus (PROBIOTIC ACIDOPHILUS PO) Take by mouth.     lidocaine (LIDODERM) 5 % Place 1 patch onto  the skin daily. Remove & Discard patch within 12 hours or as directed by MD (Patient taking differently: Place 1 patch onto the skin as needed. Remove & Discard patch within 12 hours or as directed by MD) 30 patch 0   metFORMIN (GLUCOPHAGE) 1000 MG tablet Take 1,000 mg by mouth 2 (two) times daily.      ondansetron (ZOFRAN) 4 MG tablet Take 1 tablet (4 mg total) by mouth every 8 (eight) hours as needed for nausea or vomiting. 20 tablet 1   oxyCODONE (OXY IR/ROXICODONE) 5 MG immediate release tablet Take 1 tablet (5 mg total) by mouth every 4 (four) hours as needed for moderate pain (pain score 4-6). 30 tablet 0   Oxycodone HCl 20 MG TABS Take 1 tablet by mouth every 6 (six) hours as needed.     OZEMPIC, 0.25 OR 0.5 MG/DOSE, 2 MG/3ML SOPN Inject 0.5 mg into the skin once a week. Monday     pregabalin (LYRICA) 200 MG capsule Take 1 capsule (200 mg total) by mouth in the morning, at noon, and at bedtime. 90 capsule 0   tadalafil (CIALIS) 20 MG tablet Take 20 mg by mouth daily as needed for erectile dysfunction.     tiZANidine (ZANAFLEX) 4 MG tablet Take 4 mg by mouth 3 (three) times daily as needed.     zolpidem (AMBIEN) 10 MG tablet Take 1 tablet (10 mg total) by mouth at bedtime as needed. for sleep 30 tablet 2   pantoprazole (PROTONIX) 40 MG tablet Take 1 tablet (40 mg total) by mouth daily. 90 tablet 3   No current facility-administered medications for this visit.     Abtx:  Anti-infectives (From admission, onward)    None       REVIEW OF SYSTEMS:  Const: negative fever, negative chills, negative weight loss Eyes: negative diplopia or visual changes, negative eye pain ENT: negative coryza, negative sore throat Resp: negative cough, hemoptysis, dyspnea Cards: negative for chest pain, palpitations, lower extremity edema GU: negative for frequency, dysuria and hematuria GI: Negative for abdominal pain, diarrhea, bleeding, constipation Skin: negative for rash and pruritus Heme: negative for easy bruising and gum/nose bleeding MS: pain and swelling  left knee much better Neurolo:negative for headaches, dizziness, vertigo, memory problems  Psych:  anxiety Endocrine: , diabetes Allergy/Immunology- as above ? Objective:  VITALS:  BP (!) 152/85   Pulse 85   Temp (!) 97 F (36.1  C) (Temporal)   Wt 191 lb (86.6 kg)   BMI 27.41 kg/m    PHYSICAL EXAM:  General: Alert, cooperative, no distress, appears stated age.  Head: Normocephalic, without obvious abnormality, atraumatic. Eyes: Conjunctivae clear, anicteric sclerae. Pupils are equal ENT Nares normal. No drainage or sinus tenderness. Lips, mucosa, and tongue normal. No Thrush Neck: Supple, symmetrical, no adenopathy, thyroid: non tender no carotid bruit and no JVD. Back: No CVA tenderness. Lungs: Clear to auscultation bilaterally. No Wheezing or Rhonchi. No rales. Heart: Regular rate and rhythm, no murmur, rub or gallop. Abdomen: Soft, non-tender,not distended. Bowel sounds normal. No masses Extremities: left knee surgical scar healed well No  swelling  Skin: No rashes or lesions. Or bruising Lymph: Cervical, supraclavicular normal. Neurologic: Grossly non-focal  Imaging- done at his ortho's office      Left knee x-ray: Normal alignment (09/21/2022) ? Impression/Recommendation ? ?Prosthetic Joint Infection Successful explantation and reimplantation of left knee prosthesis with negative cultures at the time of reimplantation. Patient has been on Cefadroxil for 5 months post-reimplantation. No current signs  of infection. -Continue Cefadroxil 1g (500mg  x 2) BID for one more month -Order inflammatory markers (Sed rate and CRP) to assess for any ongoing inflammation. -Plan to discuss with Dr. Ernest Pine regarding the duration of antibiotic therapy. -Refill Cefadroxil for 1 month at [facility].  Diabetes Patient reports good control with Metformin and Jardiance. -Continue current regimen. -Check A1C on 10/27/2022.   Treated hep C   DM on metformin and ozempic   HTN on atenolol and clonidine   HLD on atorvastatin   -Follow-up after lab results and discussion with Dr. Ernest Pine. Follow PRN with me

## 2022-10-26 ENCOUNTER — Telehealth: Payer: Self-pay

## 2022-10-26 NOTE — Telephone Encounter (Signed)
We have left messages x 3  and sent a MyChart message in regards to this refill request.  Patient has not responded to either.  I am going to close encounter.  If patient calls back we need to find out what he is taking the lamisil for and schedule a lab appointment to check his liver functions prior to refilling this medication.  FYI to Dr. Ermalene Searing.

## 2022-10-26 NOTE — Telephone Encounter (Signed)
-----   Message from Lynn Ito sent at 10/25/2022  6:26 PM EDT ----- Please let him know that labs are pretty mich the same like last time ESR 70 and cdrp 4.3- ask him to continue cefadroxil as prescribed ----- Message ----- From: Leory Plowman, Lab In [place] Sent: 10/25/2022  11:35 AM EDT To: Lynn Ito, MD

## 2022-10-27 ENCOUNTER — Encounter: Payer: Self-pay | Admitting: Family Medicine

## 2022-10-27 ENCOUNTER — Ambulatory Visit: Payer: PPO | Admitting: Family Medicine

## 2022-10-27 VITALS — BP 124/78 | HR 92 | Temp 98.2°F | Ht 68.75 in | Wt 189.4 lb

## 2022-10-27 DIAGNOSIS — K429 Umbilical hernia without obstruction or gangrene: Secondary | ICD-10-CM | POA: Diagnosis not present

## 2022-10-27 DIAGNOSIS — E1142 Type 2 diabetes mellitus with diabetic polyneuropathy: Secondary | ICD-10-CM | POA: Diagnosis not present

## 2022-10-27 DIAGNOSIS — Z794 Long term (current) use of insulin: Secondary | ICD-10-CM

## 2022-10-27 DIAGNOSIS — G629 Polyneuropathy, unspecified: Secondary | ICD-10-CM

## 2022-10-27 DIAGNOSIS — E1140 Type 2 diabetes mellitus with diabetic neuropathy, unspecified: Secondary | ICD-10-CM

## 2022-10-27 DIAGNOSIS — R1032 Left lower quadrant pain: Secondary | ICD-10-CM

## 2022-10-27 LAB — POC URINALSYSI DIPSTICK (AUTOMATED)
Bilirubin, UA: NEGATIVE
Blood, UA: NEGATIVE
Glucose, UA: POSITIVE — AB
Ketones, UA: NEGATIVE
Leukocytes, UA: NEGATIVE
Nitrite, UA: NEGATIVE
Protein, UA: POSITIVE — AB
Spec Grav, UA: 1.025 (ref 1.010–1.025)
Urobilinogen, UA: 0.2 U/dL
pH, UA: 6 (ref 5.0–8.0)

## 2022-10-27 LAB — POCT GLYCOSYLATED HEMOGLOBIN (HGB A1C): Hemoglobin A1C: 6.6 % — AB (ref 4.0–5.6)

## 2022-10-27 MED ORDER — INSULIN ASPART 100 UNIT/ML IJ SOLN: INTRAMUSCULAR | 99 refills | Status: AC

## 2022-10-27 NOTE — Progress Notes (Signed)
Patient ID: Erik Martinez, male    DOB: 04-17-1955, 67 y.o.   MRN: [id number]  This visit was conducted in person.  BP 124/78   Pulse 92   Temp 98.2 F (36.8 C) (Oral)   Ht 5' 8.75" (1.746 m)   Wt 189 lb 6 oz (85.9 kg)   SpO2 99%   BMI 28.17 kg/m    CC:  Chief Complaint  Patient presents with   Medical Management of Chronic Issues    Here for DM f/u. Pt provided recent OV notes from visit with Dr Rivka Safer.    Groin Pain    C/o L groin pain and protruding navel. Started 3-4 mos ago.     Subjective:   HPI: Erik Martinez is a 66 y.o. male presenting on 10/27/2022 for Medical Management of Chronic Issues (Here for DM f/u. Pt provided recent OV notes from visit with Dr Rivka Safer. ) and Groin Pain (C/o L groin pain and protruding navel. Started 3-4 mos ago. )  Recent revision of joint replacements successful per recent orthopedic note.  Diabetes: Due for urine microalbumin.  On semaglutide 1 mg weekly. On jardiance 25 mg daily.  He is interested in using novolog Lab Results  Component Value Date   HGBA1C 6.6 (A) 10/27/2022  Using medications without difficulties: Hypoglycemic episodes: none Hyperglycemic episodes: occ after eating Feet problems: no ulcers Blood Sugars averaging:  FBS 120-124. Eye exam within last year:  Wt Readings from Last 3 Encounters:  10/27/22 189 lb 6 oz (85.9 kg)  10/25/22 191 lb (86.6 kg)  06/01/22 195 lb 1.7 oz (88.5 kg)  Body mass index is 28.17 kg/m.   Continued issues with neuropathy even with lyrica 200 mg TID.  SE to cymbalta, amitriptyline.  Gabapentin did not help as much.  ON celebrex 200 mg BID   He is also noted left groin pain x 6 months or more.  Now with protrusion at his navel ongoing in the last 3 to 4 months.  No associated pain.  Relevant past medical, surgical, family and social history reviewed and updated as indicated. Interim medical history since our last visit reviewed. Allergies and medications reviewed  and updated. Outpatient Medications Prior to Visit  Medication Sig Dispense Refill   acetaminophen (TYLENOL) 500 MG tablet Take 1,500 mg by mouth 2 (two) times daily as needed for moderate pain or mild pain.     aspirin EC 81 MG tablet Take 1 tablet (81 mg total) by mouth in the morning and at bedtime. Swallow whole. 150 tablet 2   atorvastatin (LIPITOR) 20 MG tablet Take 20 mg by mouth at bedtime.     cefadroxil (DURICEF) 500 MG capsule Take 2 capsules (1,000 mg total) by mouth 2 (two) times daily. 120 capsule 0   celecoxib (CELEBREX) 200 MG capsule Take 1 capsule (200 mg total) by mouth 2 (two) times daily. 30 capsule 2   cyclobenzaprine (FLEXERIL) 10 MG tablet 3 (three) times daily.     empagliflozin (JARDIANCE) 25 MG TABS tablet Take 1 tablet (25 mg total) by mouth daily. 90 tablet 1   ferrous sulfate 324 MG TBEC TAKE ONE TABLET BY MOUTH MONDAY,WEDNESDAY,FRIDAY     Lactobacillus (PROBIOTIC ACIDOPHILUS PO) Take by mouth.     lidocaine (LIDODERM) 5 % Place 1 patch onto the skin daily. Remove & Discard patch within 12 hours or as directed by MD (Patient taking differently: Place 1 patch onto the skin as needed. Remove & Discard patch within  12 hours or as directed by MD) 30 patch 0   metFORMIN (GLUCOPHAGE) 1000 MG tablet Take 1,000 mg by mouth 2 (two) times daily.     ondansetron (ZOFRAN) 4 MG tablet Take 1 tablet (4 mg total) by mouth every 8 (eight) hours as needed for nausea or vomiting. 20 tablet 1   oxyCODONE (OXY IR/ROXICODONE) 5 MG immediate release tablet Take 1 tablet (5 mg total) by mouth every 4 (four) hours as needed for moderate pain (pain score 4-6). 30 tablet 0   Oxycodone HCl 20 MG TABS Take 1 tablet by mouth every 6 (six) hours as needed.     OZEMPIC, 0.25 OR 0.5 MG/DOSE, 2 MG/3ML SOPN Inject 0.5 mg into the skin once a week. Monday     pregabalin (LYRICA) 200 MG capsule Take 1 capsule (200 mg total) by mouth in the morning, at noon, and at bedtime. 90 capsule 0   tadalafil  (CIALIS) 20 MG tablet Take 20 mg by mouth daily as needed for erectile dysfunction.     tiZANidine (ZANAFLEX) 4 MG tablet Take 4 mg by mouth 3 (three) times daily as needed.     zolpidem (AMBIEN) 10 MG tablet Take 1 tablet (10 mg total) by mouth at bedtime as needed. for sleep 30 tablet 2   pantoprazole (PROTONIX) 40 MG tablet Take 1 tablet (40 mg total) by mouth daily. 90 tablet 3   No facility-administered medications prior to visit.     Per HPI unless specifically indicated in ROS section below Review of Systems  Constitutional:  Negative for fatigue and fever.  HENT:  Negative for ear pain.   Eyes:  Negative for pain.  Respiratory:  Negative for cough and shortness of breath.   Cardiovascular:  Negative for chest pain, palpitations and leg swelling.  Gastrointestinal:  Negative for abdominal pain.  Genitourinary:  Negative for dysuria.  Musculoskeletal:  Negative for arthralgias.  Neurological:  Negative for syncope, light-headedness and headaches.  Psychiatric/Behavioral:  Negative for dysphoric mood.    Objective:  BP 124/78   Pulse 92   Temp 98.2 F (36.8 C) (Oral)   Ht 5' 8.75" (1.746 m)   Wt 189 lb 6 oz (85.9 kg)   SpO2 99%   BMI 28.17 kg/m   Wt Readings from Last 3 Encounters:  10/27/22 189 lb 6 oz (85.9 kg)  10/25/22 191 lb (86.6 kg)  06/01/22 195 lb 1.7 oz (88.5 kg)      Physical Exam Exam conducted with a chaperone present.  Constitutional:      Appearance: He is well-developed.  HENT:     Head: Normocephalic.     Right Ear: Hearing normal.     Left Ear: Hearing normal.     Nose: Nose normal.  Neck:     Thyroid: No thyroid mass or thyromegaly.     Vascular: No carotid bruit.     Trachea: Trachea normal.  Cardiovascular:     Rate and Rhythm: Normal rate and regular rhythm.     Pulses: Normal pulses.     Heart sounds: Heart sounds not distant. No murmur heard.    No friction rub. No gallop.     Comments: No peripheral edema Pulmonary:     Effort:  Pulmonary effort is normal. No respiratory distress.     Breath sounds: Normal breath sounds.  Abdominal:     Hernia: There is no hernia in the left inguinal area or right inguinal area.  Genitourinary:    Testes: Normal.  Right: Mass, tenderness, swelling, testicular hydrocele or varicocele not present.        Left: Mass, tenderness, swelling, testicular hydrocele or varicocele not present.     Epididymis:     Right: Normal.     Left: Normal.  Lymphadenopathy:     Lower Body: No right inguinal adenopathy. No left inguinal adenopathy.  Skin:    General: Skin is warm and dry.     Findings: No rash.  Psychiatric:        Speech: Speech normal.        Behavior: Behavior normal.        Thought Content: Thought content normal.       Results for orders placed or performed in visit on 10/27/22  Microalbumin / creatinine urine ratio  Result Value Ref Range   Microalb, Ur 2.7 (H) 0.0 - 1.9 mg/dL   Creatinine,U 40.9 mg/dL   Microalb Creat Ratio 3.1 0.0 - 30.0 mg/g  Vitamin B12  Result Value Ref Range   Vitamin B-12 395 211 - 911 pg/mL  Comprehensive metabolic panel  Result Value Ref Range   Sodium 140 135 - 145 mEq/L   Potassium 3.8 3.5 - 5.1 mEq/L   Chloride 105 96 - 112 mEq/L   CO2 24 19 - 32 mEq/L   Glucose, Bld 50 (L) 70 - 99 mg/dL   BUN 23 6 - 23 mg/dL   Creatinine, Ser 8.11 0.40 - 1.50 mg/dL   Total Bilirubin 0.2 0.2 - 1.2 mg/dL   Alkaline Phosphatase 97 39 - 117 U/L   AST 15 0 - 37 U/L   ALT 10 0 - 53 U/L   Total Protein 8.1 6.0 - 8.3 g/dL   Albumin 4.3 3.5 - 5.2 g/dL   GFR 91.47 >82.95 mL/min   Calcium 9.7 8.4 - 10.5 mg/dL  Lipid panel  Result Value Ref Range   Cholesterol 133 0 - 200 mg/dL   Triglycerides 621.3 0.0 - 149.0 mg/dL   HDL 08.65 >78.46 mg/dL   VLDL 96.2 0.0 - 95.2 mg/dL   LDL Cholesterol 60 0 - 99 mg/dL   Total CHOL/HDL Ratio 3    NonHDL 82.72   POCT glycosylated hemoglobin (Hb A1C)  Result Value Ref Range   Hemoglobin A1C 6.6 (A) 4.0 - 5.6  %   HbA1c POC (<> result, manual entry)     HbA1c, POC (prediabetic range)     HbA1c, POC (controlled diabetic range)    POCT Urinalysis Dipstick (Automated)  Result Value Ref Range   Color, UA yellow    Clarity, UA clear    Glucose, UA Positive (A) Negative   Bilirubin, UA negative    Ketones, UA negative    Spec Grav, UA 1.025 1.010 - 1.025   Blood, UA negative    pH, UA 6.0 5.0 - 8.0   Protein, UA Positive (A) Negative   Urobilinogen, UA 0.2 0.2 or 1.0 E.U./dL   Nitrite, UA negative    Leukocytes, UA Negative Negative    Assessment and Plan  Type 2 diabetes mellitus with diabetic polyneuropathy, without long-term current use of insulin (HCC) Assessment & Plan: Chronic, good control on  Ozempic to 1 mg weekly.  We will move forward with Ozempic 1 mg weekly, continue metformin 1000 mg twice daily and Jardiance 25 mg daily Other medications prescribed at [place]. He has requested sliding scale insulin to use if he has blood sugar spikes with meals.  Orders: -     POCT glycosylated hemoglobin (  Hb A1C) -     Microalbumin / creatinine urine ratio  Left inguinal pain Assessment & Plan: No clear sign of inguinal hernia on exam of groin.  Orders: -     POCT Urinalysis Dipstick (Automated)  Type 2 diabetes mellitus with diabetic neuropathy, with long-term current use of insulin (HCC) -     Vitamin B12 -     Comprehensive metabolic panel -     Lipid panel  Neuropathy Assessment & Plan: Chronic, poor control despite Lyrica 200 mg p.o. 3 times daily.  He would like to take more of this if possible but I discussed with him that this is not recommended.  In the past he has had side effects to Cymbalta and amitriptyline.  Gabapentin did not help as much as Lyrica.  He is also on Celebrex 200 mg p.o. twice daily.  Try magnesium  500 mg at bedtime.  Start ALA  600 mg daily for neuropathy.   Umbilical hernia without obstruction and without gangrene Assessment & Plan: Acute,  definite not on incarcerated, easily reducible umbilical hernia.  I recommended no further treatment unless pain occurs.  Return and ER precautions provided.  Reviewed with patient urgent signs of incarceration of hernia.   Other orders -     Insulin Aspart; Per sliding scale  MAX 15 units in 24 hour period.  Dispense: 10 mL; Refill: PRN    Return in about 6 months (around 04/27/2023) for phone AMW,  fasting labs then CPE with me.   Kerby Nora, MD

## 2022-10-27 NOTE — Assessment & Plan Note (Addendum)
Chronic, good control on  Ozempic to 1 mg weekly.  We will move forward with Ozempic 1 mg weekly, continue metformin 1000 mg twice daily and Jardiance 25 mg daily Other medications prescribed at [place]. He has requested sliding scale insulin to use if he has blood sugar spikes with meals.

## 2022-10-27 NOTE — Telephone Encounter (Signed)
Patient aware.

## 2022-10-27 NOTE — Patient Instructions (Addendum)
Try magnesium  500 mg at bedtime.  Start ALA  600 mg daily for neuropathy. We will call with labs results.

## 2022-10-28 LAB — COMPREHENSIVE METABOLIC PANEL
ALT: 10 U/L (ref 0–53)
AST: 15 U/L (ref 0–37)
Albumin: 4.3 g/dL (ref 3.5–5.2)
Alkaline Phosphatase: 97 U/L (ref 39–117)
BUN: 23 mg/dL (ref 6–23)
CO2: 24 meq/L (ref 19–32)
Calcium: 9.7 mg/dL (ref 8.4–10.5)
Chloride: 105 meq/L (ref 96–112)
Creatinine, Ser: 1.02 mg/dL (ref 0.40–1.50)
GFR: 76.9 mL/min (ref >60.00)
Glucose, Bld: 50 mg/dL — ABNORMAL LOW (ref 70–99)
Potassium: 3.8 meq/L (ref 3.5–5.1)
Sodium: 140 meq/L (ref 135–145)
Total Bilirubin: 0.2 mg/dL (ref 0.2–1.2)
Total Protein: 8.1 g/dL (ref 6.0–8.3)

## 2022-10-28 LAB — MICROALBUMIN / CREATININE URINE RATIO
Creatinine,U: 87.8 mg/dL
Microalb Creat Ratio: 3.1 mg/g (ref 0.0–30.0)
Microalb, Ur: 2.7 mg/dL — ABNORMAL HIGH (ref 0.0–1.9)

## 2022-10-28 LAB — LIPID PANEL
Cholesterol: 133 mg/dL (ref 0–200)
HDL: 50.4 mg/dL (ref >39.00)
LDL Cholesterol: 60 mg/dL (ref 0–99)
NonHDL: 82.72
Total CHOL/HDL Ratio: 3
Triglycerides: 113 mg/dL (ref 0.0–149.0)
VLDL: 22.6 mg/dL (ref 0.0–40.0)

## 2022-10-28 LAB — VITAMIN B12: Vitamin B-12: 395 pg/mL (ref 211–911)

## 2022-10-31 ENCOUNTER — Encounter: Payer: Self-pay | Admitting: *Deleted

## 2022-11-02 ENCOUNTER — Telehealth: Payer: Self-pay | Admitting: Family Medicine

## 2022-11-02 ENCOUNTER — Ambulatory Visit
Admission: RE | Admit: 2022-11-02 | Discharge: 2022-11-02 | Disposition: A | Discharge status: Home / Self Care | Payer: PPO | Source: Ambulatory Visit | Attending: Physician Assistant | Admitting: Physician Assistant

## 2022-11-02 DIAGNOSIS — G8929 Other chronic pain: Secondary | ICD-10-CM

## 2022-11-02 MED ORDER — ALPRAZOLAM 0.5 MG PO TABS: 0.5000 mg | ORAL_TABLET | Freq: Once | ORAL | 0 refills | Status: DC | PRN

## 2022-11-02 NOTE — Telephone Encounter (Signed)
Left message for [name] that Dr. Ermalene Searing sent in a Rx for alprazolam to take 30 minutes prior to MRI.  Rx was sent to [facility].

## 2022-11-02 NOTE — Telephone Encounter (Signed)
Patient called in and stated that he is having a MRI done today and he is claustrophobic. He was wanting to know if something like a Valium could be called in for him. He stated that he has to go in today at 2:30 for the MRI. Thank you!

## 2022-11-02 NOTE — Telephone Encounter (Signed)
Rx sent to local pharmacy.  

## 2022-11-03 ENCOUNTER — Ambulatory Visit
Admission: RE | Admit: 2022-11-03 | Discharge: 2022-11-03 | Disposition: A | Discharge status: Home / Self Care | Payer: PPO | Source: Ambulatory Visit | Attending: Physician Assistant | Admitting: Physician Assistant

## 2022-11-03 DIAGNOSIS — M87051 Idiopathic aseptic necrosis of right femur: Secondary | ICD-10-CM | POA: Diagnosis not present

## 2022-11-03 DIAGNOSIS — M1611 Unilateral primary osteoarthritis, right hip: Secondary | ICD-10-CM | POA: Diagnosis not present

## 2022-11-03 DIAGNOSIS — G8929 Other chronic pain: Secondary | ICD-10-CM | POA: Diagnosis not present

## 2022-11-03 DIAGNOSIS — Z96642 Presence of left artificial hip joint: Secondary | ICD-10-CM | POA: Diagnosis not present

## 2022-11-03 DIAGNOSIS — M25551 Pain in right hip: Secondary | ICD-10-CM | POA: Diagnosis not present

## 2022-11-03 DIAGNOSIS — M89551 Osteolysis, right thigh: Secondary | ICD-10-CM | POA: Diagnosis not present

## 2022-11-04 DIAGNOSIS — R1032 Left lower quadrant pain: Secondary | ICD-10-CM | POA: Insufficient documentation

## 2022-11-04 DIAGNOSIS — K429 Umbilical hernia without obstruction or gangrene: Secondary | ICD-10-CM | POA: Insufficient documentation

## 2022-11-04 NOTE — Assessment & Plan Note (Signed)
No clear sign of inguinal hernia on exam of groin.

## 2022-11-04 NOTE — Assessment & Plan Note (Signed)
Chronic, poor control despite Lyrica 200 mg p.o. 3 times daily.  He would like to take more of this if possible but I discussed with him that this is not recommended.  In the past he has had side effects to Cymbalta and amitriptyline.  Gabapentin did not help as much as Lyrica.  He is also on Celebrex 200 mg p.o. twice daily.  Try magnesium  500 mg at bedtime.  Start ALA  600 mg daily for neuropathy.

## 2022-11-04 NOTE — Assessment & Plan Note (Signed)
Acute, definite not on incarcerated, easily reducible umbilical hernia.  I recommended no further treatment unless pain occurs.  Return and ER precautions provided.  Reviewed with patient urgent signs of incarceration of hernia.

## 2022-11-10 ENCOUNTER — Telehealth: Payer: Self-pay | Admitting: Family Medicine

## 2022-11-10 ENCOUNTER — Encounter: Payer: Self-pay | Admitting: Pharmacist

## 2022-11-10 DIAGNOSIS — E1142 Type 2 diabetes mellitus with diabetic polyneuropathy: Secondary | ICD-10-CM

## 2022-11-10 MED ORDER — OZEMPIC (0.25 OR 0.5 MG/DOSE) 2 MG/3ML ~~LOC~~ SOPN: 0.5000 mg | PEN_INJECTOR | SUBCUTANEOUS | 1 refills | Status: DC

## 2022-11-10 NOTE — Telephone Encounter (Signed)
Left message for Erik Martinez that his MRI is still pending review by radiologist. I advised there is a shortage currently of radiologist and it is taking up to 2 weeks before we get the final report back.

## 2022-11-10 NOTE — Progress Notes (Signed)
Patient called clinic requesting help with re-enrollment for NovoCares for 2025.   Called patient and left message providing direct call back number.   Patient portion of Novo application has been completed electronically. Uploaded in Media Tab.  Provider portion forwarded to PCP, Dr. Ermalene Searing for signature.   Nothing further is needed at this time. Patient will receive notification once approved.   Loree Fee, PharmD Clinical Pharmacist [facility] [phone]

## 2022-11-10 NOTE — Telephone Encounter (Signed)
Pt called in and stated that he has not heard anything from back about his MRI results pt would like a called back if possible

## 2022-11-10 NOTE — Telephone Encounter (Signed)
Refill sent as requested. 

## 2022-11-10 NOTE — Telephone Encounter (Signed)
Prescription Request  11/10/2022  LOV: 10/27/2022  What is the name of the medication or equipment? OZEMPIC, 0.25 OR 0.5 MG/DOSE, 2 MG/3ML SOPN   Have you contacted your pharmacy to request a refill? Yes   Which pharmacy would you like this sent to?  [facility] - [place], [place] - [address] [place] [address] Phone: [phone] Fax: [phone]    Patient notified that their request is being sent to the clinical staff for review and that they should receive a response within 2 business days.   Please advise at Mobile [phone] (mobile)

## 2022-11-10 NOTE — Telephone Encounter (Signed)
Pt called stating he's on a program that helps with his ozempic meds & the program is only for 6 months at a time. Pt is requesting help with enrolling again for the next 6 months once his meds runs out? Call back # [phone]

## 2022-11-14 DIAGNOSIS — M545 Low back pain, unspecified: Secondary | ICD-10-CM | POA: Diagnosis not present

## 2022-11-14 DIAGNOSIS — Z79891 Long term (current) use of opiate analgesic: Secondary | ICD-10-CM | POA: Diagnosis not present

## 2022-11-14 DIAGNOSIS — M160 Bilateral primary osteoarthritis of hip: Secondary | ICD-10-CM | POA: Diagnosis not present

## 2022-11-14 DIAGNOSIS — G894 Chronic pain syndrome: Secondary | ICD-10-CM | POA: Diagnosis not present

## 2022-11-14 DIAGNOSIS — M4802 Spinal stenosis, cervical region: Secondary | ICD-10-CM | POA: Diagnosis not present

## 2022-11-14 DIAGNOSIS — M25512 Pain in left shoulder: Secondary | ICD-10-CM | POA: Diagnosis not present

## 2022-11-15 ENCOUNTER — Telehealth: Payer: Self-pay

## 2022-11-15 NOTE — Telephone Encounter (Signed)
Patient called stating he was seen by pain management and they discussed thoughts of a possible spinal stimulator to help with his pain.  Patient reports he does not feel comfortable with this and would like to know your thoughts.  Please advise Sheronica Corey Erik Martinez, CMA

## 2022-11-15 NOTE — Telephone Encounter (Signed)
Patient returned call and staff gave providers recommendation against pain stimulator due to risk of infection. Patient agreed with this and will inform pain provider as well.   Valarie Cones

## 2022-11-17 ENCOUNTER — Other Ambulatory Visit (HOSPITAL_COMMUNITY): Payer: Self-pay

## 2022-11-17 ENCOUNTER — Telehealth: Payer: Self-pay

## 2022-11-17 NOTE — Telephone Encounter (Signed)
Pharmacy Patient Advocate Encounter   Received notification from CoverMyMeds that prior authorization for Ozempic (0.25 or 0.5 MG/DOSE) 2MG /3ML pen-injectors is required/requested.   Insurance verification completed.   The patient is insured through [facility] ADVANTAGE/RX ADVANCE .   Per test claim: APPROVED from 11/17/22 to 11/17/23. Ran test claim, Copay is $232.22 (1 month supply). This test claim was processed through [facility]- copay amounts may vary at other pharmacies due to pharmacy/plan contracts, or as the patient moves through the different stages of their insurance plan.   Key: [id number]

## 2022-11-17 NOTE — Telephone Encounter (Signed)
Noted.  Application for PAP Novo Nordisk for the Ozempic has been submitted by Linday Zinc R, RPH.

## 2022-11-24 ENCOUNTER — Telehealth: Payer: Self-pay | Admitting: Family Medicine

## 2022-11-24 NOTE — Telephone Encounter (Signed)
Patient is request a phone call regarding receiving his ozempic.He said that he is in a program that helps him get his ozempic and is wondering if he is still in it because his ozempoic is getting low.

## 2022-11-25 NOTE — Telephone Encounter (Signed)
Patient called regarding this request, advised that order was getting ready to ship and we will notify him once received. Patient understood and will await call from [place]

## 2022-11-25 NOTE — Telephone Encounter (Signed)
Hi,I checked the clinic fridge.I do not see any ozempic for him.So he may have came to pick it up however it wasn't documented.

## 2022-11-26 ENCOUNTER — Other Ambulatory Visit: Payer: Self-pay | Admitting: Family Medicine

## 2022-11-26 DIAGNOSIS — G4700 Insomnia, unspecified: Secondary | ICD-10-CM

## 2022-11-28 NOTE — Telephone Encounter (Signed)
Last office visit 10/27/22 for medical management of chronic issues.  Last refilled 09/13/22 for #30 with 2 refill.  Next Appt: CPE 04/27/23.

## 2022-11-30 ENCOUNTER — Telehealth: Payer: Self-pay | Admitting: Family Medicine

## 2022-11-30 NOTE — Telephone Encounter (Signed)
Received from [employer] PAP: Ozempic 4mg /36ml x 4 pens.  Lot [id number] Exp: 07/09/2025.   Left message for Mr. Clarkin that medication is ready to be picked up here at the office.

## 2022-12-10 ENCOUNTER — Other Ambulatory Visit: Payer: Self-pay | Admitting: Infectious Diseases

## 2022-12-12 DIAGNOSIS — Z79891 Long term (current) use of opiate analgesic: Secondary | ICD-10-CM | POA: Diagnosis not present

## 2022-12-12 DIAGNOSIS — M160 Bilateral primary osteoarthritis of hip: Secondary | ICD-10-CM | POA: Diagnosis not present

## 2022-12-12 DIAGNOSIS — M25512 Pain in left shoulder: Secondary | ICD-10-CM | POA: Diagnosis not present

## 2022-12-12 DIAGNOSIS — M545 Low back pain, unspecified: Secondary | ICD-10-CM | POA: Diagnosis not present

## 2022-12-12 DIAGNOSIS — G894 Chronic pain syndrome: Secondary | ICD-10-CM | POA: Diagnosis not present

## 2022-12-12 DIAGNOSIS — M4802 Spinal stenosis, cervical region: Secondary | ICD-10-CM | POA: Diagnosis not present

## 2022-12-13 ENCOUNTER — Telehealth: Payer: Self-pay

## 2022-12-13 NOTE — Telephone Encounter (Signed)
error 

## 2022-12-14 IMAGING — MR MR THORACIC SPINE W/O CM
5 of 6 series · 30 of 48 positions shown · non-contrast
Comparison: Lumbar spine radiographs 01/14/2021

CLINICAL DATA: Thoracic compression fracture.

EXAM:
MRI THORACIC SPINE WITHOUT CONTRAST
TECHNIQUE: Multiplanar, multisequence MR imaging of the thoracic spine was
performed. No intravenous contrast was administered.

[Series 18: T1 · sagittal · 6.0mm · 1.41mm/px · 4 of 9 slices shown (1 of 2)]
[im 1/9]
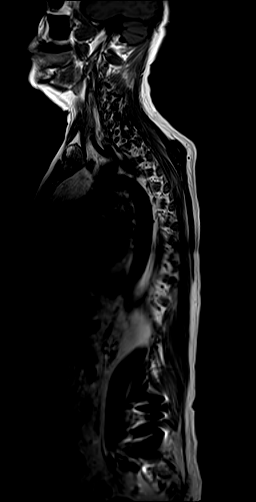
[im 3/9]
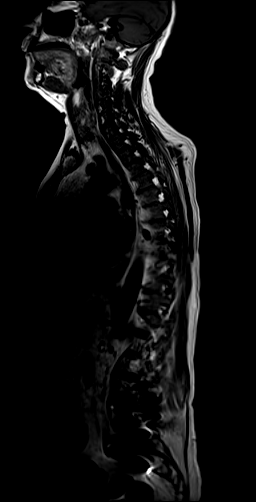
[im 6/9]
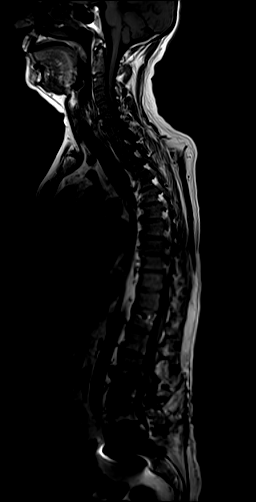
[im 9/9]
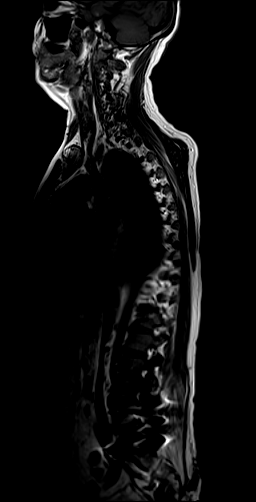

[Series 19: T2 · sagittal · 3.0mm · 1.06mm/px · 6 of 19 slices shown (1 of 2)]
[im 1/19]
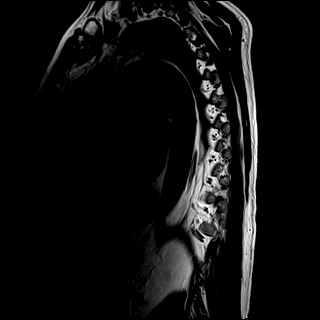
[im 4/19]
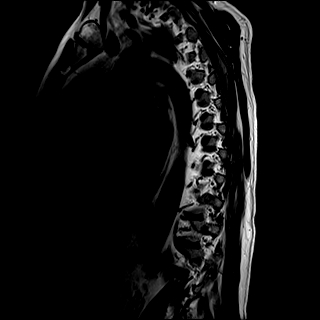
[im 8/19]
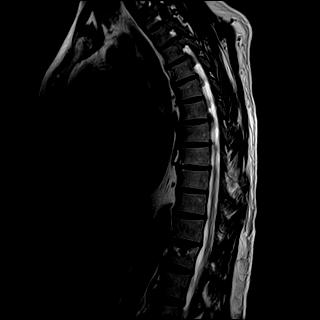
[im 11/19]
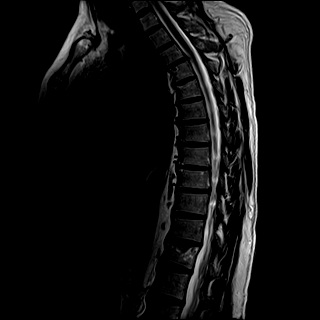
[im 15/19]
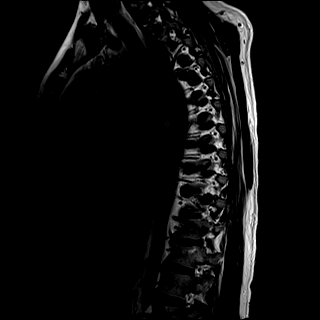
[im 19/19]
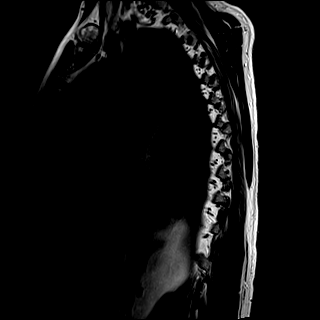

[Series 20: T1 · sagittal · 3.0mm · 1.06mm/px · 6 of 19 slices shown (2 of 2)]
[im 1/19]
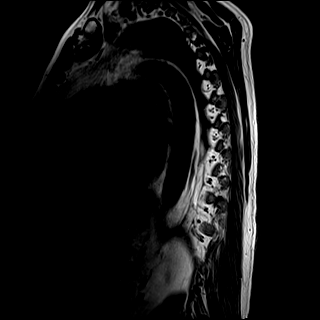
[im 4/19]
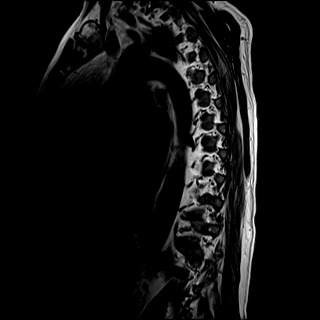
[im 8/19]
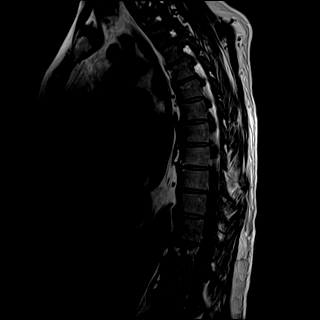
[im 11/19]
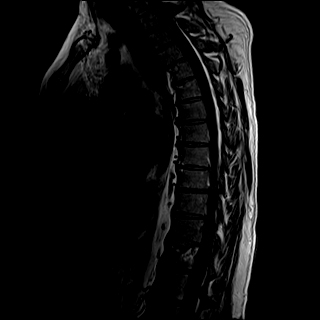
[im 15/19]
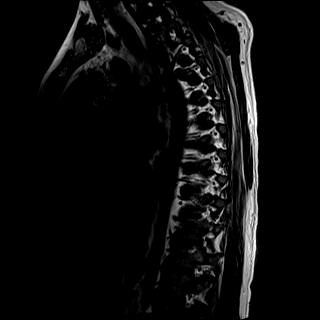
[im 19/19]
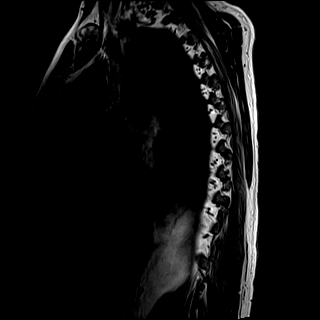

[Series 21: STIR · sagittal · 3.0mm · 0.53mm/px · 5 of 19 slices shown]
[im 1/19]
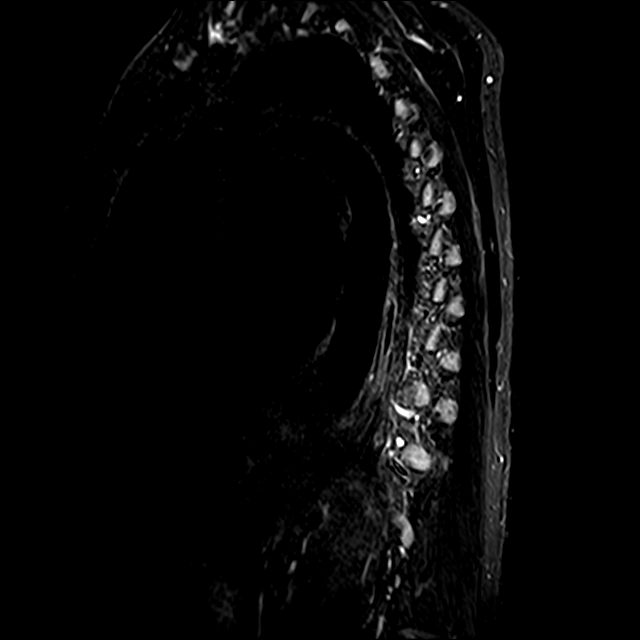
[im 4/19]
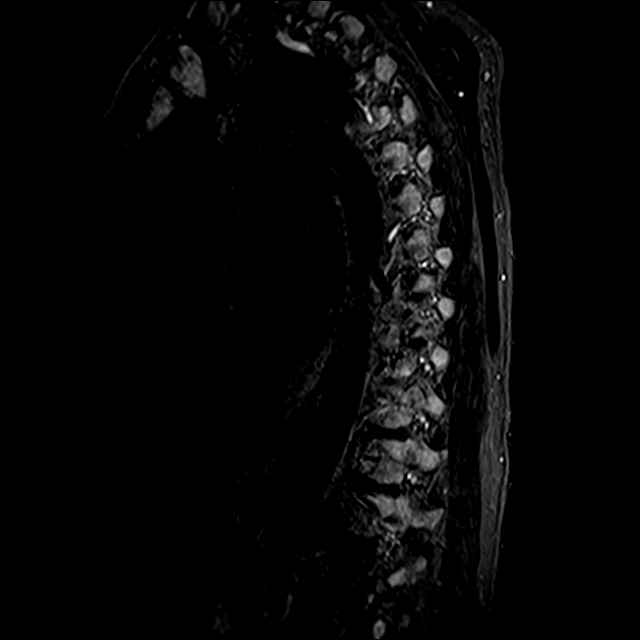
[im 8/19]
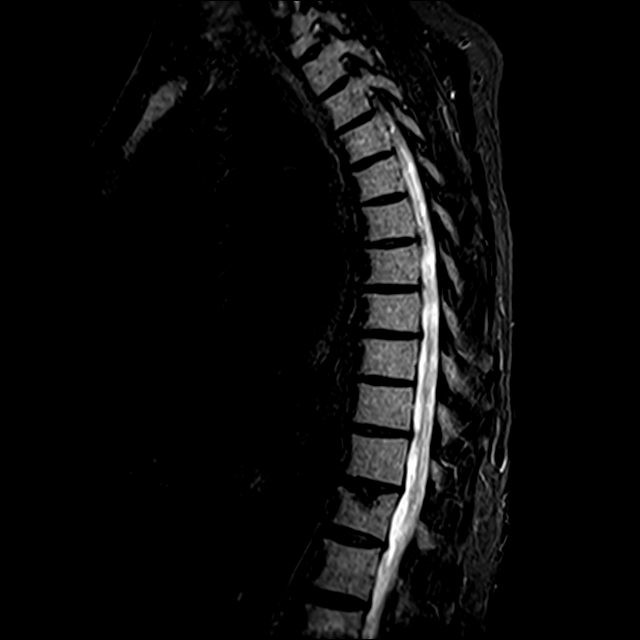
[im 11/19]
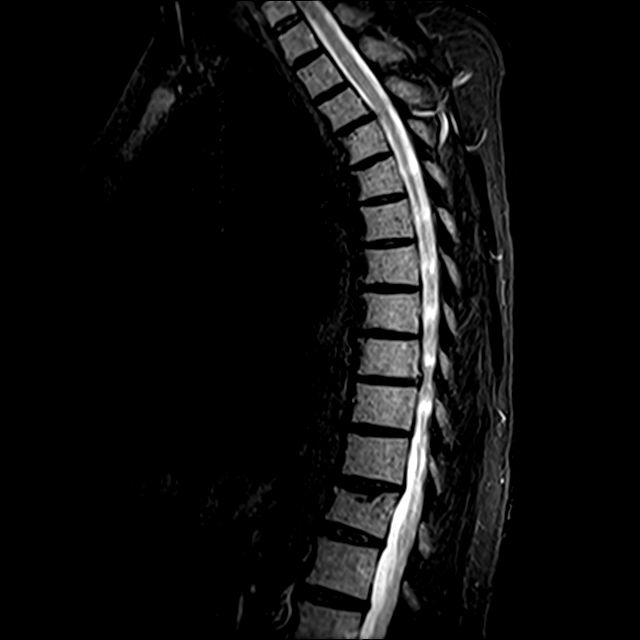
[im 15/19]
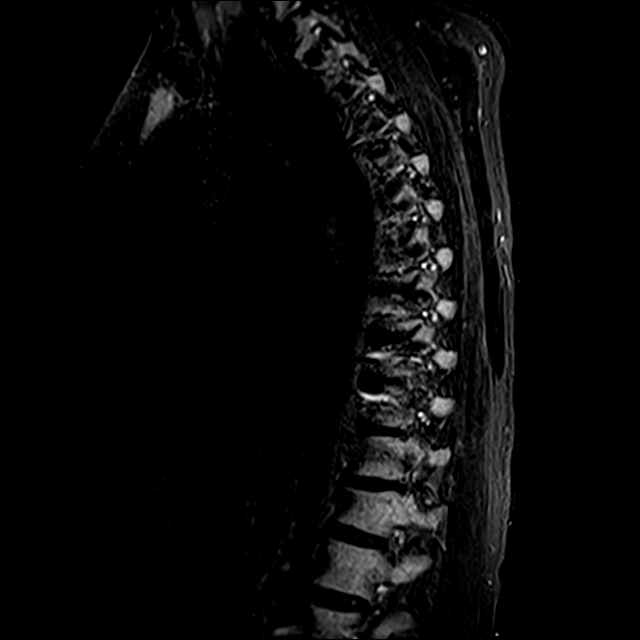

[Series 24: T2 · axial · 4.0mm · 0.59mm/px · z∈[-361,-133]mm · 9 of 39 slices shown (2 of 2)]
[im 1/39]
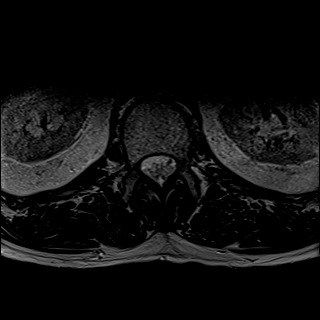
[im 7/39]
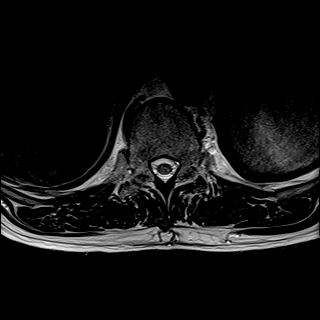
[im 13/39]
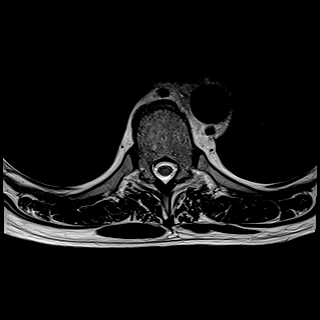
[im 16/39]
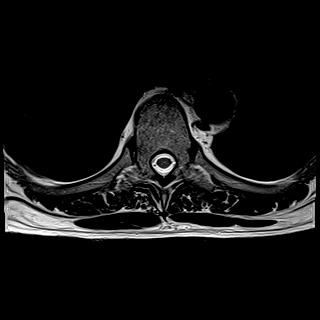
[im 20/39]
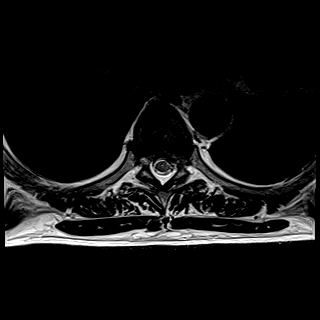
[im 23/39]
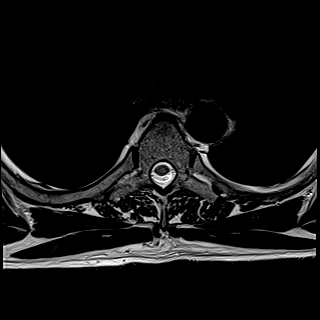
[im 26/39]
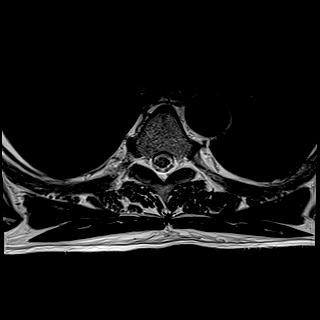
[im 32/39]
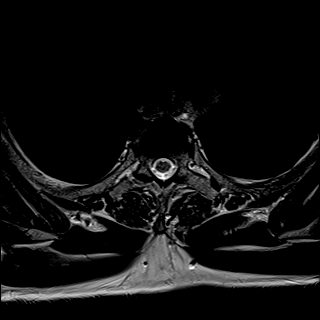
[im 39/39]
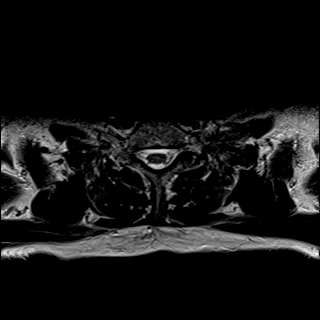

[30 of 48 positions shown; findings below may reference images not displayed]

FINDINGS: Alignment:  Normal

Vertebrae: Mild superior endplate fracture T12 appears chronic. No
bone marrow edema. No change from the recent x-rays. No change from
x-ray of 10/05/2016

Mild superior endplate fractures of T4 and T5 which appear chronic.
Negative for mass lesion.

Cord: Negative for cord mass lesion or cord compression. Focal
mm syrinx in the cord at the T10 level without stenosis.

Paraspinal and other soft tissues: Negative for paraspinous mass or
fluid

Disc levels:

Mild thoracic degenerative change.  No significant spinal stenosis.

T6-7: Small left-sided disc protrusion

T7-8: Small central disc protrusion

T8-9: Small central disc protrusion

T9-10: Small right paracentral disc protrusion.

T10-11: Small left-sided disc protrusion.
IMPRESSION: Mild chronic compression fractures T4, T5, T12.  No acute fracture

Mild thoracic degenerative change with small disc protrusions. No
significant thoracic stenosis.

## 2022-12-14 IMAGING — MR MR LUMBAR SPINE W/O CM
5 series · 36 of 48 positions shown · non-contrast
Comparison: Lumbar spine radiographs 01/14/2021. Lumbar MRI
09/05/2005

CLINICAL DATA: Lumbar radiculopathy. Prior back surgery. Fall 6
months ago

EXAM:
MRI LUMBAR SPINE WITHOUT CONTRAST
TECHNIQUE: Multiplanar, multisequence MR imaging of the lumbar spine was
performed. No intravenous contrast was administered.

[Series 16: t2_tse_warp_sag · sagittal · 4.0mm · 0.88mm/px · 6 of 17 slices shown]
[im 1/17]
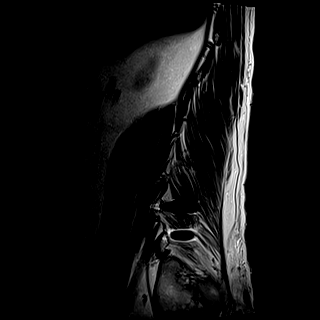
[im 4/17]
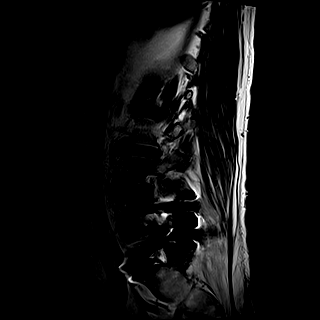
[im 7/17]
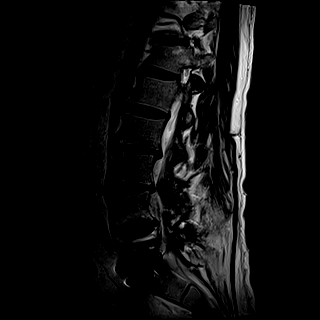
[im 10/17]
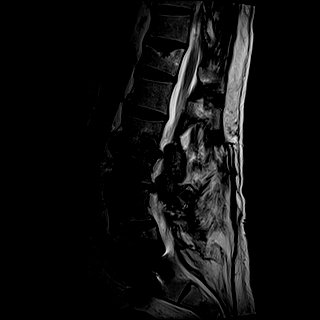
[im 13/17]
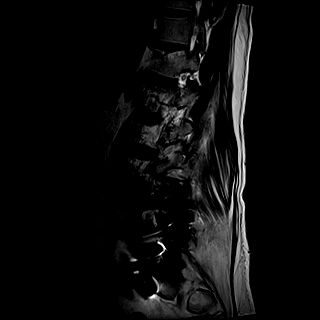
[im 17/17]
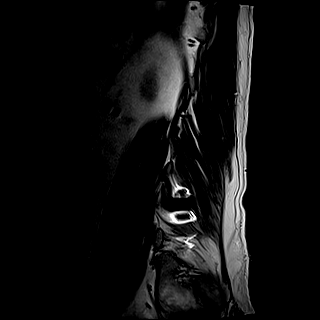

[Series 17: t1_tse_warp_sag · sagittal · 4.0mm · 0.88mm/px · 7 of 17 slices shown]
[im 1/17]
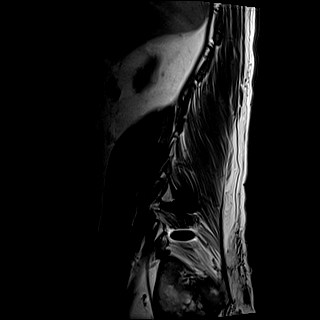
[im 3/17]
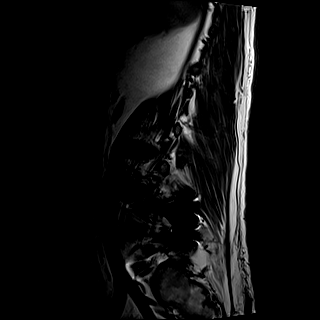
[im 6/17]
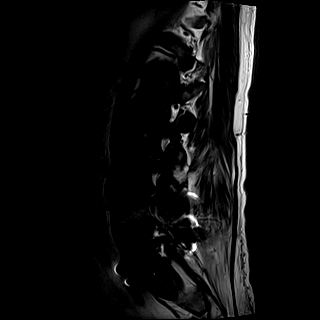
[im 9/17]
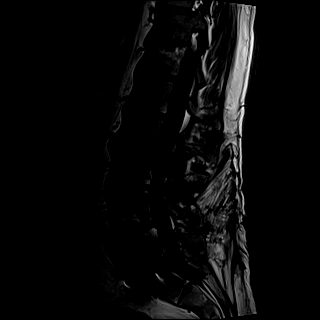
[im 11/17]
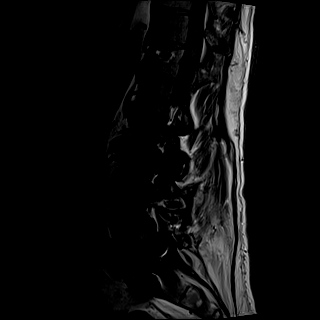
[im 14/17]
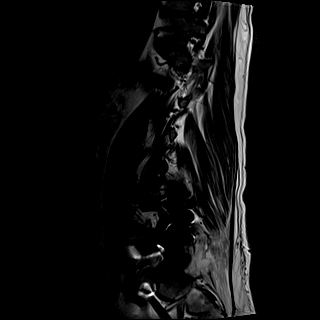
[im 17/17]
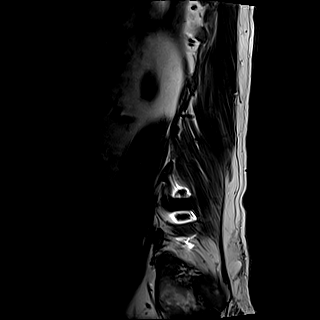

[Series 18: t2_tse_stir_warp_sag · sagittal · 4.0mm · 0.88mm/px · 7 of 17 slices shown]
[im 1/17]
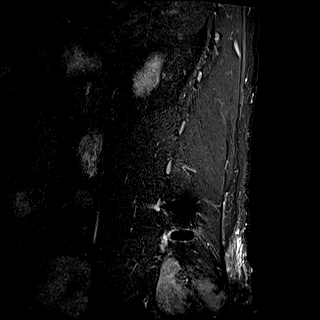
[im 3/17]
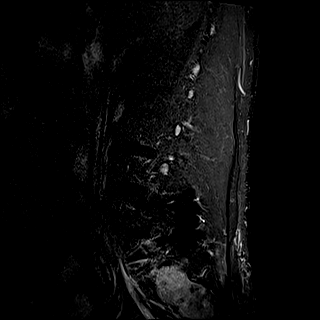
[im 6/17]
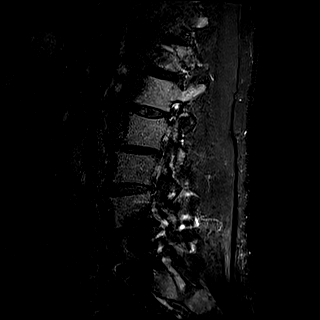
[im 9/17]
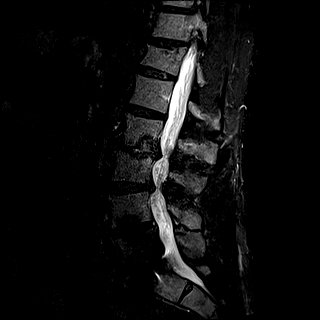
[im 11/17]
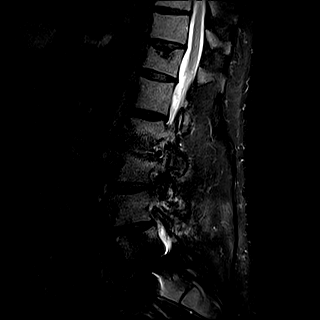
[im 14/17]
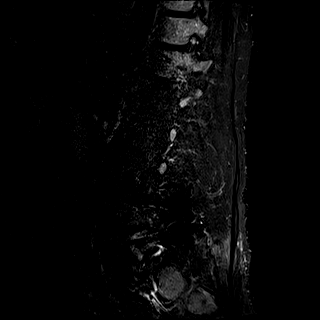
[im 17/17]
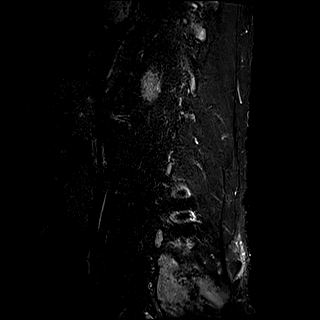

[Series 19: T2 · axial · 4.0mm · 0.78mm/px · z∈[-586,-362]mm · 8 of 36 slices shown]
[im 1/36]
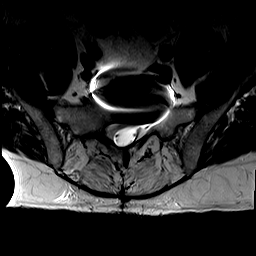
[im 6/36]
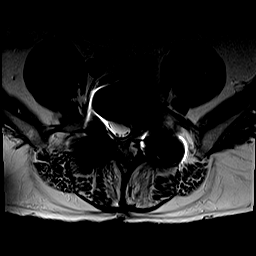
[im 11/36]
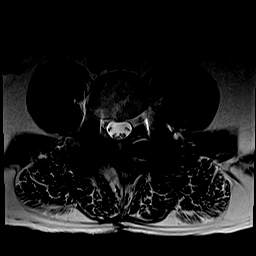
[im 17/36]
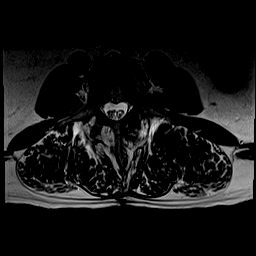
[im 19/36]
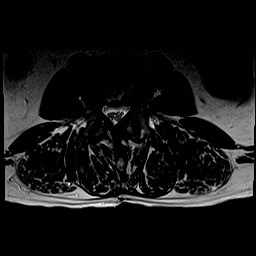
[im 25/36]
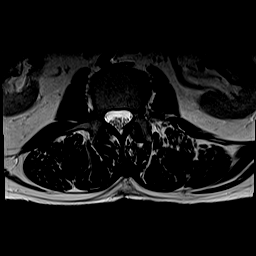
[im 30/36]
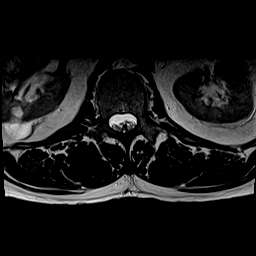
[im 36/36]
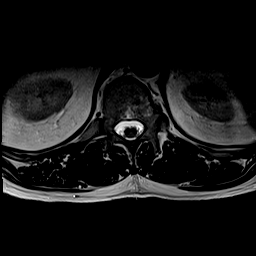

[Series 20: T1 · axial · 4.0mm · 0.39mm/px · z∈[-586,-362]mm · 8 of 36 slices shown]
[im 1/36]
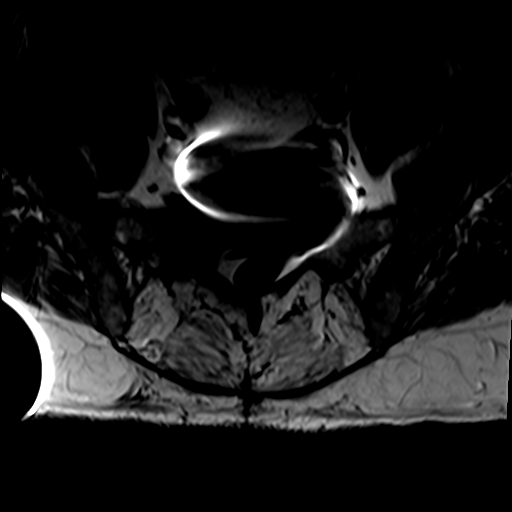
[im 6/36]
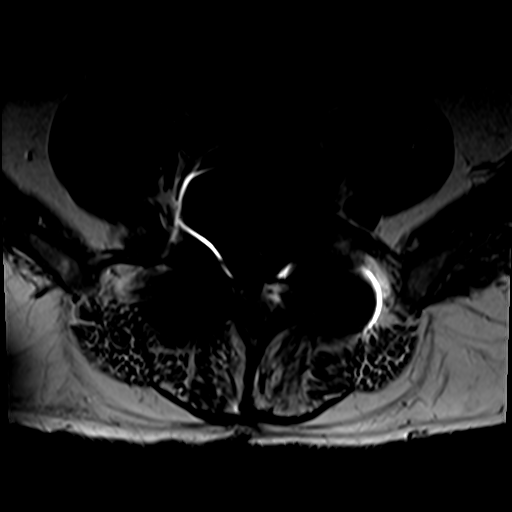
[im 11/36]
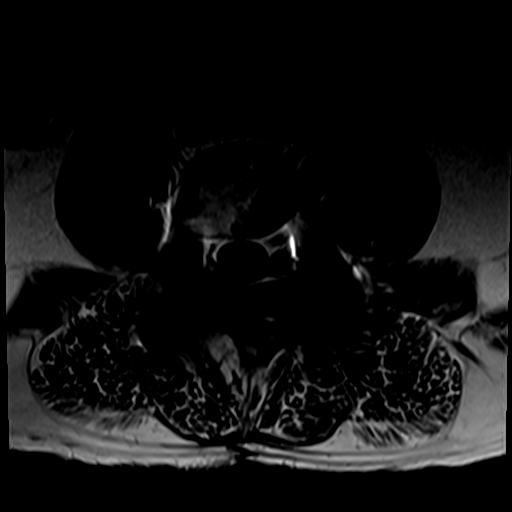
[im 17/36]
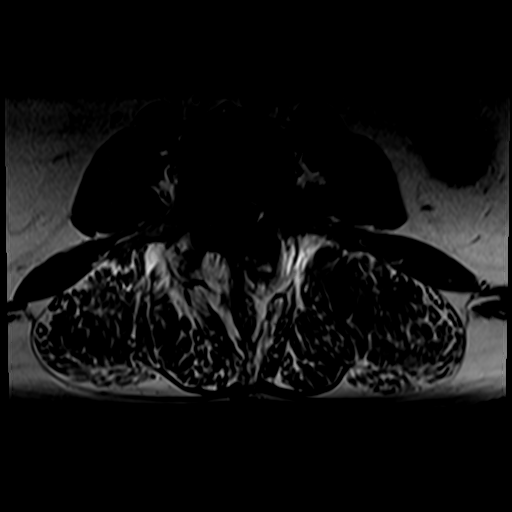
[im 19/36]
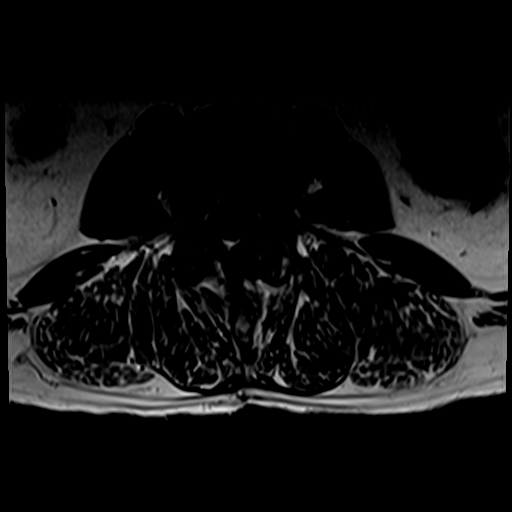
[im 25/36]
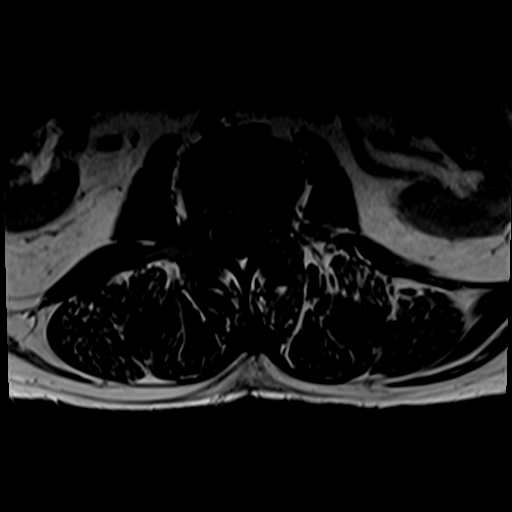
[im 30/36]
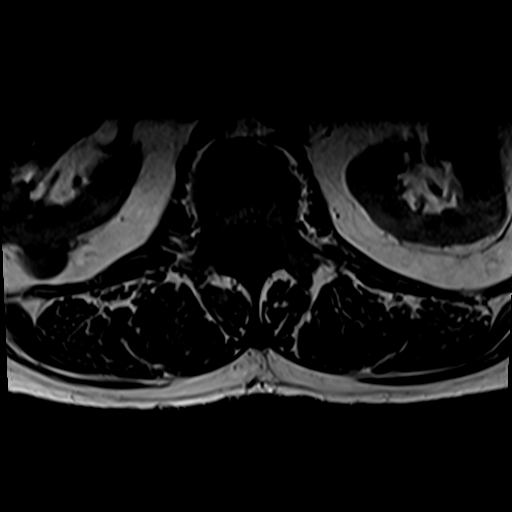
[im 36/36]
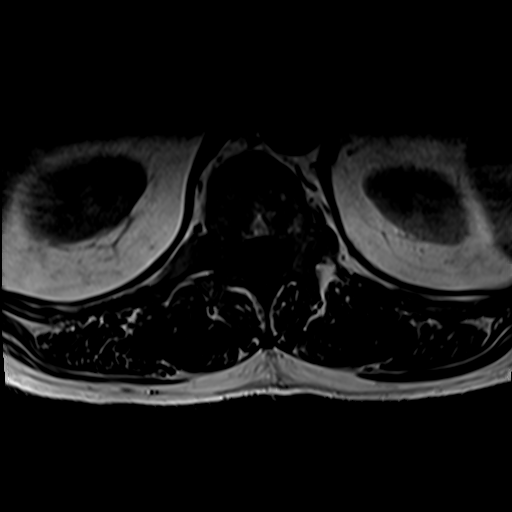

[36 of 48 positions shown; findings below may reference images not displayed]

FINDINGS: Segmentation:  5 lumbar vertebra.

Alignment:  Mild retrolisthesis L2-3 and L3-4

Vertebrae: Chronic superior endplate fracture of T12. No lumbar
fracture or mass. Prominent Schmorl's node inferior endplate of L2
on the left.

Conus medullaris and cauda equina: Conus extends to the L1-2 level.
Conus and cauda equina appear normal.

Paraspinal and other soft tissues: No paraspinous mass or
adenopathy. Right renal cyst.

Disc levels:

Shallow central disc protrusion. L1-2: Shallow central disc
protrusion. Negative for stenosis

L2-3: Disc degeneration with diffuse uncinate spurring. Disc
degeneration is more prominent on the left than the right with
prominent Schmorl's node on the left. Bilateral facet and ligamentum
flavum hypertrophy. Moderate spinal stenosis. Moderate subarticular
stenosis bilaterally. Mild left foraminal stenosis

L3-4: Broad-based central disc protrusion. Bilateral facet and
ligamentum flavum hypertrophy. Moderate to severe spinal stenosis.
Moderate subarticular stenosis bilaterally left greater than right.

L4-5: Bilateral pedicle screw and interbody fusion. Spinal canal
partially obscured by artifact from hardware. No significant
stenosis

L5-S1: Prosthetic disc causing significant artifact. No significant
stenosis. Bilateral facet degeneration.
IMPRESSION: Moderate spinal stenosis L2-3 with moderate subarticular stenosis
bilaterally

Moderate to severe spinal stenosis L3-4 with moderate subarticular
stenosis bilaterally left greater than right

Pedicle screw and interbody fusion L4-5 without stenosis

Prosthetic disc L5-S1 without stenosis.

## 2022-12-20 NOTE — Telephone Encounter (Signed)
Patient came in to office to pick up medication.

## 2022-12-21 DIAGNOSIS — M87051 Idiopathic aseptic necrosis of right femur: Secondary | ICD-10-CM | POA: Diagnosis not present

## 2022-12-21 DIAGNOSIS — M25542 Pain in joints of left hand: Secondary | ICD-10-CM | POA: Diagnosis not present

## 2022-12-22 ENCOUNTER — Encounter: Payer: Self-pay | Admitting: Pharmacist

## 2022-12-22 NOTE — Progress Notes (Signed)
Manufacturer Assistance Program (MAP) Application   Manufacturer: [employer]    (Re-enrollment) Medication(s): Ozempic  Patient Portion of Application:  11/10/22: Completed with patient via online enrollment tool.  12/22/22: Re-completed via online portal to ensure 2025 specified Income Documentation: N/A - Electronic verification elected.  Provider Portion of Application:  11/10/22:  Online re-enrollment sent to MD via email; Not received Re-completed via PDF, faxed to clinic, Clinical Pool cc'd Prescription(s): Included in MAP application.   Application Status:  Pending MD signature  Next Steps: []    PCP signature []    Upon signature(s) Application to be faxed to [employer] Fax: [phone] AND scanned into patient chart  Forwarded to [facility] CPhT Patient Advocate Team for suture correspondences/re-enrollment.  Note routed to PCP Clinic Pool  [facility] clinic team - Please Addend/update this note as the "Next Steps" are completed in office*

## 2022-12-26 NOTE — Progress Notes (Signed)
Received application.  Message states Ozempic but Novolog Flexpen is marked on Application.  Please advise.

## 2022-12-27 NOTE — Progress Notes (Signed)
PAP forms for Ozempic placed in Dr. Daphine Deutscher in box to complete.

## 2022-12-27 NOTE — Progress Notes (Signed)
PAP form completed.  Patient is on Ozempic 1 mg weekly, please change med list.

## 2022-12-28 NOTE — Progress Notes (Addendum)
Next Steps:  Faxed on 12/28/2022 [x]    PCP signature [x]    Upon signature(s) Application to be faxed to [employer] Fax: [phone] AND scanned into patient chart  Medication list updated.

## 2022-12-28 NOTE — Addendum Note (Signed)
Addended by: Damita Lack on: 12/28/2022 09:49 AM   Modules accepted: Orders

## 2022-12-31 ENCOUNTER — Other Ambulatory Visit: Payer: Self-pay | Admitting: Family Medicine

## 2022-12-31 DIAGNOSIS — G4700 Insomnia, unspecified: Secondary | ICD-10-CM

## 2023-01-02 ENCOUNTER — Telehealth: Payer: Self-pay | Admitting: *Deleted

## 2023-01-02 NOTE — Telephone Encounter (Signed)
We will need to fill out a dose change form and fax it into [employer].

## 2023-01-02 NOTE — Telephone Encounter (Signed)
Patient get Ozempic through patient assistance.

## 2023-01-02 NOTE — Telephone Encounter (Signed)
Last office visit 10/27/2022 for DM. Neuropathy, Umbilical hernia and left inguinal pain.  Last refilled 11/29/2022 for #30 with no refills.  Next Appt: CPE 04/27/2023.

## 2023-01-02 NOTE — Telephone Encounter (Signed)
Copied from CRM [id number]. Topic: Clinical - Medication Question >> Jan 02, 2023  8:22 AM Maxwell Marion wrote: Reason for CRM: Pt is waiting on Ambien refill request to be signed by doctor and sent to his pharmacy ([facility]), was sent over by pharmacy on 12/21, pt also wants to let doctor know that he wants to increase his Ozempic dose.

## 2023-01-09 DIAGNOSIS — M25512 Pain in left shoulder: Secondary | ICD-10-CM | POA: Diagnosis not present

## 2023-01-09 DIAGNOSIS — M160 Bilateral primary osteoarthritis of hip: Secondary | ICD-10-CM | POA: Diagnosis not present

## 2023-01-09 DIAGNOSIS — G894 Chronic pain syndrome: Secondary | ICD-10-CM | POA: Diagnosis not present

## 2023-01-09 DIAGNOSIS — M545 Low back pain, unspecified: Secondary | ICD-10-CM | POA: Diagnosis not present

## 2023-01-09 DIAGNOSIS — M4802 Spinal stenosis, cervical region: Secondary | ICD-10-CM | POA: Diagnosis not present

## 2023-01-09 DIAGNOSIS — Z79891 Long term (current) use of opiate analgesic: Secondary | ICD-10-CM | POA: Diagnosis not present

## 2023-01-12 ENCOUNTER — Telehealth: Payer: Self-pay

## 2023-01-12 NOTE — Telephone Encounter (Signed)
 DME order Dexcom 6 for [employer] placed in Dr. Daphine Deutscher office in box for signature.

## 2023-01-12 NOTE — Telephone Encounter (Signed)
 Copied from CRM [id number]. Topic: Clinical - Medical Advice >> Jan 12, 2023  4:31 PM Erik Martinez wrote: Reason for CRM: patient is wondering if he could have an order placed for him to receive an dexcom monitor.He said that he think it would be good for him to have and much easier for him to check his blood sugar.

## 2023-01-13 NOTE — Telephone Encounter (Signed)
 Completed and in outbox.

## 2023-01-16 NOTE — Telephone Encounter (Signed)
 Left message for Erik Martinez that order for Dexcom G6 sensors and receiver has been faxed to [employer] [facility].

## 2023-01-16 NOTE — Telephone Encounter (Signed)
 DME order for Dexcom G6 Sensor and Receiver faxed to [employer] at [phone].

## 2023-01-17 ENCOUNTER — Telehealth: Payer: Self-pay | Admitting: *Deleted

## 2023-01-17 NOTE — Telephone Encounter (Signed)
 Novo Nordisk Pap Forms for dose change placed in Dr. Daphine Deutscher office in box to complete.

## 2023-01-17 NOTE — Telephone Encounter (Signed)
 Copied from CRM [id number]. Topic: Clinical - Prescription Issue >> Jan 17, 2023  4:33 PM Sim Boast F wrote: Reason for CRM: Please call patient regarding Ozympic, he wants to confirm if the dosage will be raised or not?

## 2023-01-17 NOTE — Telephone Encounter (Signed)
 Completed and in outbox.

## 2023-01-18 ENCOUNTER — Encounter: Payer: Self-pay | Admitting: Family Medicine

## 2023-01-18 NOTE — Telephone Encounter (Signed)
 Left message for Erik Martinez to return call to office in regards to Ozempic dose increase or he can also respond to Dr. Daphine Deutscher MyChart message.

## 2023-01-18 NOTE — Telephone Encounter (Signed)
 What dose are you increasing [name] too.  That wasn't marked on form.

## 2023-01-19 NOTE — Telephone Encounter (Signed)
 Left message for Mr. Zelman to return call to office in regards to Ozempic dose increase or he can also respond to Dr. Daphine Deutscher MyChart message.

## 2023-01-20 NOTE — Telephone Encounter (Signed)
 Left message for Erik Martinez to return call to office in regards to Ozempic  dose increase or he can also respond to Dr. Sherrel MyChart message.         I have left patient multiple message and Dr. Danton has also sent patient a MyChart message in regards to increasing his Ozempic .  I will also mail patient a letter today because we can't do anything until we talk to patient.    I am going to close this phone encounter and wait to hear back from patient.   FYI to Dr. Avelina.

## 2023-01-24 ENCOUNTER — Ambulatory Visit: Payer: PPO | Admitting: Podiatry

## 2023-01-24 ENCOUNTER — Ambulatory Visit (INDEPENDENT_AMBULATORY_CARE_PROVIDER_SITE_OTHER): Payer: PPO

## 2023-01-24 ENCOUNTER — Encounter: Payer: Self-pay | Admitting: Podiatry

## 2023-01-24 VITALS — Ht 69.0 in | Wt 189.0 lb

## 2023-01-24 DIAGNOSIS — B351 Tinea unguium: Secondary | ICD-10-CM | POA: Diagnosis not present

## 2023-01-24 DIAGNOSIS — E0843 Diabetes mellitus due to underlying condition with diabetic autonomic (poly)neuropathy: Secondary | ICD-10-CM

## 2023-01-24 DIAGNOSIS — E119 Type 2 diabetes mellitus without complications: Secondary | ICD-10-CM | POA: Diagnosis not present

## 2023-01-24 DIAGNOSIS — I739 Peripheral vascular disease, unspecified: Secondary | ICD-10-CM | POA: Diagnosis not present

## 2023-01-24 DIAGNOSIS — M79671 Pain in right foot: Secondary | ICD-10-CM

## 2023-01-24 DIAGNOSIS — M79675 Pain in left toe(s): Secondary | ICD-10-CM | POA: Diagnosis not present

## 2023-01-24 DIAGNOSIS — M79674 Pain in right toe(s): Secondary | ICD-10-CM

## 2023-01-24 DIAGNOSIS — M2140 Flat foot [pes planus] (acquired), unspecified foot: Secondary | ICD-10-CM | POA: Diagnosis not present

## 2023-01-24 DIAGNOSIS — M79672 Pain in left foot: Secondary | ICD-10-CM | POA: Diagnosis not present

## 2023-01-24 DIAGNOSIS — H2513 Age-related nuclear cataract, bilateral: Secondary | ICD-10-CM | POA: Diagnosis not present

## 2023-01-24 DIAGNOSIS — B353 Tinea pedis: Secondary | ICD-10-CM

## 2023-01-24 LAB — HM DIABETES EYE EXAM

## 2023-01-24 MED ORDER — CLOTRIMAZOLE-BETAMETHASONE 1-0.05 % EX CREA: 1.0000 | TOPICAL_CREAM | Freq: Every day | CUTANEOUS | 3 refills | Status: DC

## 2023-01-24 MED ORDER — TERBINAFINE HCL 250 MG PO TABS: 250.0000 mg | ORAL_TABLET | Freq: Every day | ORAL | 0 refills | Status: DC

## 2023-01-24 NOTE — Progress Notes (Addendum)
 Chief Complaint  Patient presents with   Foot Pain    RM8: bil toes shifting/rubbing getting worse and causing pain( right worse)    HPI: 68 y.o. male presenting today for follow-up evaluation of bilateral foot pain.  History of diabetes w/ peripheral arterial disease. H/o onychomycosis of the toenails.  He has had Lamisil  in the past with improvement.  Presenting for further treatment evaluation  Past Medical History:  Diagnosis Date   Anemia    Anxiety    Arthritis    Avascular necrosis of bones of both hips (HCC)    Bipolar disorder (HCC)    Bladder spasms    Chronic pain of left knee    Chronic pain syndrome    a.) followed by pain clinic   Cocaine use disorder, moderate, dependence (HCC) 09/09/2015   DDD (degenerative disc disease)    Depression    Erectile dysfunction    a.) on PDE5i (sildenafil)   Frequency-urgency syndrome    GERD (gastroesophageal reflux disease)    Hepatitis C 2018   History of hepatitis B    1983  TX'D--  NO ISSUES OR SYMPTOMS SINCE   Hyperlipidemia    Hypotestosteronemia    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   Neurogenic bladder    Nocturia    PAD (peripheral artery disease) (HCC)    Polysubstance abuse (HCC)    a.) cocaine + marijuana + BZO + opioids   PONV (postoperative nausea and vomiting)    difficult to put me to sleep   Spinal headache    T2DM (type 2 diabetes mellitus) (HCC)    Urine incontinence     Past Surgical History:  Procedure Laterality Date   APPLICATION OF WOUND VAC Left 09/11/2021   Procedure: APPLICATION OF WOUND VAC;  Surgeon: [name], MD;  Location: [facility];  Service: Orthopedics;  Laterality: Left;  [id number]    BACK SURGERY     2 rods and 4 screws artificial disc   COLONOSCOPY  2008   INTERSTIM IMPLANT PLACEMENT  2007   INTERSTIM IMPLANT REVISION N/A 08/17/2012   Procedure: REPLACMENT OF IPG PLUS REPLACE LEAD OF INTERSTIM IMPLANT ;  Surgeon: [name], MD;  Location: [facility];  Service: Urology;  Laterality: N/A;   IRRIGATION AND DEBRIDEMENT KNEE Left 09/11/2021   Procedure: IRRIGATION AND DEBRIDEMENT WITH POLY EXCHANGE LEFT KNEE;  Surgeon: [name], MD;  Location: [facility];  Service: Orthopedics;  Laterality: Left;   IRRIGATION AND DEBRIDEMENT KNEE Left 06/01/2022   Procedure: IRRIGATION AND DEBRIDEMENT KNEE;  Surgeon: [name], MD;  Location: [facility];  Service: Orthopedics;  Laterality: Left;   LOWER EXTREMITY ANGIOGRAPHY Left 11/17/2020   Procedure: LOWER EXTREMITY ANGIOGRAPHY;  Surgeon: [name], MD;  Location: [facility];  Service: Cardiovascular;  Laterality: Left;   LOWER EXTREMITY ANGIOGRAPHY Right 12/22/2020   Procedure: LOWER EXTREMITY ANGIOGRAPHY;  Surgeon: [name], MD;  Location: [facility];  Service: Cardiovascular;  Laterality: Right;   LOWER EXTREMITY ANGIOGRAPHY Left 12/14/2021   Procedure: Lower Extremity Angiography;  Surgeon: [name], MD;  Location: [facility];  Service: Cardiovascular;  Laterality: Left;   LUMBAR DISC SURGERY  2005   L5   LUMBAR FUSION  X2  2006  &  2007   MANIPULATION KNEE JOINT Left 08/2002   OPEN DEBRIDEMENT LEFT TOTAL KNEE (SCAR, BONEY GRAOWTH)/ REMOVAL OLD SUTURES  05/01/2007   PARTIAL HIP ARTHROPLASTY Left 2001  x2   SHOULDER OPEN ROTATOR CUFF REPAIR Left 2000   TOTAL HIP ARTHROPLASTY Left 2002   TOTAL KNEE ARTHROPLASTY Left 06/2002   PARTIAL LEFT KNEE REPLACEMENT PRIOR TO THIS   TOTAL KNEE REVISION Left 2008   TOTAL KNEE REVISION Left 07/19/2021   Procedure: TOTAL KNEE REVISION;  Surgeon: [name], MD;  Location: [facility];  Service: Orthopedics;  Laterality: Left;   TOTAL KNEE REVISION Left 2005   TOTAL KNEE REVISION Left 04/01/2022   Procedure: Removal of left total knee implants with insertion of spacer.;  Surgeon: [name], MD;  Location: [facility];  Service: Orthopedics;  Laterality: Left;   TOTAL KNEE REVISION Left  06/01/2022   Procedure: TOTAL KNEE REVISION;  Surgeon: [name], MD;  Location: [facility];  Service: Orthopedics;  Laterality: Left;   TRIGGER FINGER RELEASE Bilateral    SEVERAL FINGERS   TRIGGER FINGER RELEASE Right 06/18/2014   Procedure: RELEASE TRIGGER FINGER/A-1 PULLEY;  Surgeon: Lynwood SQUIBB Mardee, MD;  Location: [facility];  Service: Orthopedics;  Laterality: Right;    Allergies  Allergen Reactions   Suboxone [Buprenorphine Hcl-Naloxone  Hcl] Nausea And Vomiting   Tape Other (See Comments)    Whelps *Paper Tape is ok    Codeine Rash, Itching and Nausea Only   Morphine  And Codeine Itching and Rash   Other Rash    Telemetry electrodes   Silicone Rash    Whelps -*Paper Tape is ok      Physical Exam: General: The patient is alert and oriented x3 in no acute distress.  Dermatology: Hyperkeratosis of skin noted with superficial fissuring.  Hyperkeratotic dystrophic nails also noted 1-5 bilateral  Vascular: Known history of PAD.  Lower extremity angiography 12/14/2021.  Dr. Jama.  Most recent ABIs 01/11/2022. VAS US  ABI WITH/WO TBI 01/11/2022 ABI Findings:  +---------+------------------+-----+---------+--------+  Right   Rt Pressure (mmHg)IndexWaveform Comment   +---------+------------------+-----+---------+--------+  Brachial 146                                       +---------+------------------+-----+---------+--------+  ATA     255               1.67 triphasic          +---------+------------------+-----+---------+--------+  PTA     255               1.67 triphasic          +---------+------------------+-----+---------+--------+  Great Toe151               0.99                    +---------+------------------+-----+---------+--------+   +---------+------------------+-----+---------+-------+  Left    Lt Pressure (mmHg)IndexWaveform Comment  +---------+------------------+-----+---------+-------+  Brachial 153                                       +---------+------------------+-----+---------+-------+  ATA     255               1.67 triphasic         +---------+------------------+-----+---------+-------+  PTA     247               1.61 triphasic         +---------+------------------+-----+---------+-------+  Burnetta Lovings  0.78                   +---------+------------------+-----+---------+-------+   +-------+----------------+-----------+----------------+------------+  ABI/TBIToday's ABI     Today's TBIPrevious ABI    Previous TBI  +-------+----------------+-----------+----------------+------------+  Right Non compressible0.99       Non compressible1.04          +-------+----------------+-----------+----------------+------------+  Left  Non compressible0.78       Non compressible0.54          +-------+----------------+-----------+----------------+------------+  Arterial wall calcification precludes accurate ankle pressures and ABIs.  Limited imaging showed patent left popliteal artery with no evidence of  restenosis. Bilateral ABIs appear essentially unchanged compared to prior study on  10/29/2021.   Summary:  Right: Resting right ankle-brachial index indicates noncompressible right  lower extremity arteries. The right toe-brachial index is normal.  Left: Resting left ankle-brachial index indicates noncompressible left  lower extremity arteries. The left toe-brachial index is normal.   Neurological: Light touch and protective threshold diminished  Musculoskeletal Exam: No pedal deformities noted  Radiographic exam B/L feet 01/24/2023: Chronic degenerative changes noted to the pedal joints of the foot bilateral.  Increased calcification noted to the pedal arteries of the foot compared to prior x-rays  Assessment/Plan of Care: 1.  Diabetes mellitus with peripheral polyneuropathy 2.  Tinea pedis both 3.  Onychomycosis of toenails both 4.  Peripheral vascular  disease bilateral with arterial calcifications  -Patient evaluated.  Comprehensive diabetic foot exam performed today -Prescription for Lotrisone  cream.  Apply twice daily -Prescription for Lamisil  2 and 50 mg #90 daily.  Patient states that he has had significant improvement with the oral Lamisil  in the past.  Recent CMP on 10/27/2022 hepatic function WNL -Most recent ABIs 01/11/2022. H/o lower extremity angiography.  12/14/2021 Dr. Jama.  It appears that new ABIs were ordered on 04/12/2022 however they are not completed.  New ABIs ordered -Appointment with orthotics department for diabetic shoes with custom molded Plastizote insoles -Return to clinic with me as needed      Thresa EMERSON Sar, DPM [facility]  Dr. Thresa EMERSON Sar, DPM    [address] [place], [place] [address]                Office [phone]  Fax [phone]    E-stim

## 2023-01-25 ENCOUNTER — Telehealth: Payer: Self-pay

## 2023-01-25 ENCOUNTER — Other Ambulatory Visit: Payer: Self-pay | Admitting: Family Medicine

## 2023-01-25 DIAGNOSIS — G4700 Insomnia, unspecified: Secondary | ICD-10-CM

## 2023-01-25 MED ORDER — ZOLPIDEM TARTRATE 10 MG PO TABS: 10.0000 mg | ORAL_TABLET | Freq: Every evening | ORAL | 0 refills | Status: DC | PRN

## 2023-01-25 NOTE — Telephone Encounter (Signed)
 Copied from CRM [id number]. Topic: Clinical - Medication Refill >> Jan 25, 2023  9:53 AM Jayson Michael wrote: Most Recent Primary Care Visit:  Provider: Herby Lolling E  Department: [facility]  Visit Type: OFFICE VISIT  Date: 10/27/2022  Medication: zolpidem  (AMBIEN ) 10 MG tablet [[id number]]  Has the patient contacted their pharmacy? No (Agent: If no, request that the patient contact the pharmacy for the refill. If patient does not wish to contact the pharmacy document the reason why and proceed with request.) (Agent: If yes, when and what did the pharmacy advise?)  Is this the correct pharmacy for this prescription? Yes If no, delete pharmacy and type the correct one.  This is the patient's preferred pharmacy:  [facility] - [place], [place] - [address] [place] [place] [address] Phone: [phone] Fax: [phone]   Has the prescription been filled recently? Yes  Is the patient out of the medication? 5 days left   Has the patient been seen for an appointment in the last year OR does the patient have an upcoming appointment? Yes  Can we respond through MyChart? Yes  Agent: Please be advised that Rx refills may take up to 3 business days. We ask that you follow-up with your pharmacy.

## 2023-01-25 NOTE — Telephone Encounter (Signed)
 Okay..  mark 2 mg on form.Erik Aas do I also need to send [facility]? Call pt to notify we are working on this now.

## 2023-01-25 NOTE — Telephone Encounter (Signed)
 Novo Nordisk PAP forms completed for dose change to Ozempic  2 mg and faxed back at [phone].

## 2023-01-25 NOTE — Telephone Encounter (Signed)
 Once again I have tried to reach patient.  Call went straight to voicemail.  I left message letting him know that an updated dose change to Ozempic  2 mg has been faxed into [employer] PAP.  I ask that he call me back if we need to send Rx to pharmacy until we get the shipment in from PAP.  Medication list updated.

## 2023-01-25 NOTE — Telephone Encounter (Signed)
 PA request received from [facility] for terbinafine  hcl 250mg  tablet. PA submitted through covermymeds and waiting on response.

## 2023-01-25 NOTE — Addendum Note (Signed)
 Addended byHerby Lolling E on: 01/25/2023 11:03 AM   Modules accepted: Orders

## 2023-01-25 NOTE — Telephone Encounter (Signed)
 Copied from CRM [id number]. Topic: Clinical - Prescription Issue >> Jan 25, 2023  9:59 AM Jayson Michael wrote: Reason for CRM: PT only has 3 pens left of the ozempic  1mg  , Pt was unaware  that provider messaged via mychart his answer to her question was yes he is tolerating the 1 mg well and is ready to increase to 2mg   Pt is requesting a callback to speak with some one regarding issues with his ozempic  and communication with the pharmacy please callback   [phone]

## 2023-02-03 ENCOUNTER — Telehealth: Payer: Self-pay

## 2023-02-03 NOTE — Telephone Encounter (Signed)
Patient called and left a message. He received an approval letter for the ABI. I faxed the referral and office note to [facility] @ [phone]. Their phone number is [phone] Hopefully they will get him scheduled quickly Thanks

## 2023-02-07 DIAGNOSIS — M545 Low back pain, unspecified: Secondary | ICD-10-CM | POA: Diagnosis not present

## 2023-02-07 DIAGNOSIS — G890 Central pain syndrome: Secondary | ICD-10-CM | POA: Diagnosis not present

## 2023-02-07 DIAGNOSIS — M25512 Pain in left shoulder: Secondary | ICD-10-CM | POA: Diagnosis not present

## 2023-02-07 DIAGNOSIS — Z79891 Long term (current) use of opiate analgesic: Secondary | ICD-10-CM | POA: Diagnosis not present

## 2023-02-07 DIAGNOSIS — G894 Chronic pain syndrome: Secondary | ICD-10-CM | POA: Diagnosis not present

## 2023-02-07 DIAGNOSIS — M4802 Spinal stenosis, cervical region: Secondary | ICD-10-CM | POA: Diagnosis not present

## 2023-02-17 ENCOUNTER — Encounter: Payer: Self-pay | Admitting: Orthopedic Surgery

## 2023-02-20 ENCOUNTER — Other Ambulatory Visit: Payer: Self-pay | Admitting: Podiatry

## 2023-02-20 ENCOUNTER — Other Ambulatory Visit: Payer: Self-pay | Admitting: Family Medicine

## 2023-02-20 DIAGNOSIS — G4700 Insomnia, unspecified: Secondary | ICD-10-CM

## 2023-02-21 NOTE — Telephone Encounter (Signed)
Last office visit 10/27/22 for DM and Groin Pain.  Last refilled 01/25/2023 for #30 with no refills.  Next Appt: CPE 04/27/2023

## 2023-02-28 ENCOUNTER — Ambulatory Visit: Payer: PPO | Admitting: Podiatry

## 2023-02-28 ENCOUNTER — Encounter: Payer: Self-pay | Admitting: Podiatry

## 2023-02-28 ENCOUNTER — Ambulatory Visit (INDEPENDENT_AMBULATORY_CARE_PROVIDER_SITE_OTHER): Payer: PPO

## 2023-02-28 DIAGNOSIS — M2012 Hallux valgus (acquired), left foot: Secondary | ICD-10-CM | POA: Diagnosis not present

## 2023-02-28 NOTE — Progress Notes (Signed)
HPI: 68 y.o. male presenting today for follow-up evaluation of bilateral lower extremity pain.  Patient states that he does have a history of lumbar radiculopathy with peripheral neuropathy which is complicated by diabetes.  Over the past year he is managed his diabetes well and closely monitors his blood glucose levels.    Most recently the patient states that he has been developing some pain and tenderness associated to the left toe bunion site.  He has a bunion and he has been applying toe spacers to help alleviate some of the pain.    Past Medical History:  Diagnosis Date   Anemia    Anxiety    Arthritis    Avascular necrosis of bones of both hips (HCC)    Bipolar disorder (HCC)    Bladder spasms    Chronic pain of left knee    Chronic pain syndrome    a.) followed by pain clinic   Cocaine use disorder, moderate, dependence (HCC) 09/09/2015   DDD (degenerative disc disease)    Depression    Erectile dysfunction    a.) on PDE5i (sildenafil)   Frequency-urgency syndrome    GERD (gastroesophageal reflux disease)    Hepatitis C 2018   History of hepatitis B    1983  TX'D--  NO ISSUES OR SYMPTOMS SINCE   Hyperlipidemia    Hypotestosteronemia    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  Dermatology: Skin is warm, dry and supple bilateral lower extremities. Negative for open lesions or macerations.  Hyperkeratotic dystrophic nails noted 1-5 right foot consistent with onychomycosis of the toenails.  There is some hyperkeratotic preulcerative callus tissue to the distal tips of the toes 1-3  right foot.  Preulcerative calluses.  There is no open wounds upon debridement.  Vascular: Capillary refill within normal limits.  Palpable pulses both DP and PT.  Patient states that he has known history of PAD and is being managed by [facility]  Neurological: Light touch and protective threshold diminished bilaterally.   Musculoskeletal Exam: Patient is concerned for mild hallux valgus deformity to the right lower extremity.  There is some lateral deviation of the right hallux which abuts the second toe right foot  Radiographic exam B/L foot 02/28/2023: Slightly increased intermetatarsal angle between the first and second intermetatarsal's with a hallux abductus angle and deviation of the hallux.  Diffuse mild degenerative changes noted throughout the pedal joints of the foot   Assessment: 1.  Chronic lumbar radiculopathy bilateral lower extremities 2.  Diabetes mellitus with peripheral polyneuropathy 3.  Peripheral arterial disease bilateral 4.  Hallux valgus bilateral 5.  Onychomycosis of toenails right   Plan of Care:  -Patient evaluated.  X-rays reviewed -Bunion pad was provided for the patient to wear on the left foot daily.  Due to the patient's history of peripheral vascular disease I do not recommend surgical intervention due to potential risk of nonhealing -Continue wearing good supportive shoes and sneakers that are wide in the toebox area and do  not constrict the toes -Return to clinic as needed     Felecia Shelling, DPM [facility]  Dr. Felecia Shelling, DPM    [address] [place], [place] [address]                Office [phone]  Fax [phone]

## 2023-03-03 ENCOUNTER — Other Ambulatory Visit: Payer: PPO

## 2023-03-06 ENCOUNTER — Telehealth: Payer: Self-pay | Admitting: Family Medicine

## 2023-03-06 NOTE — Telephone Encounter (Signed)
 Patient picked up medication

## 2023-03-06 NOTE — Telephone Encounter (Signed)
 Received from [employer] PAP: Ozempic 8 mg/3 ml x 4 pens.  Lot [id number] Exp: 09/09/2025.  Left message for Mr. Staples that his Ozempic is ready to be picked up here at the office.

## 2023-03-08 ENCOUNTER — Other Ambulatory Visit: Payer: Self-pay | Admitting: *Deleted

## 2023-03-08 MED ORDER — TRIAMCINOLONE ACETONIDE 0.5 % EX CREA: 1.0000 | TOPICAL_CREAM | Freq: Two times a day (BID) | CUTANEOUS | 0 refills | Status: DC

## 2023-03-08 NOTE — Telephone Encounter (Signed)
 Copied from CRM [id number]. Topic: Clinical - Medication Refill >> Mar 08, 2023  3:45 PM Adaysia C wrote: Most Recent Primary Care Visit:  Provider: Kerby Nora E  Department: [facility]  Visit Type: OFFICE VISIT  Date: 10/27/2022  Medication: triamcinolone acetonide 5%  Has the patient contacted their pharmacy? Yes, they do not have the prescription because it was not transferred over from the patients old pharmacy. They instructed patient to initiate the RX refill through the provider. (Agent: If no, request that the patient contact the pharmacy for the refill. If patient does not wish to contact the pharmacy document the reason why and proceed with request.) (Agent: If yes, when and what did the pharmacy advise?)  Is this the correct pharmacy for this prescription? Yes If no, delete pharmacy and type the correct one.  This is the patient's preferred pharmacy:  [facility] - [place], [place] - [address] [place] [address] Phone: [phone] Fax: [phone]   Has the prescription been filled recently? No  Is the patient out of the medication? Yes  Has the patient been seen for an appointment in the last year OR does the patient have an upcoming appointment? Yes  Can we respond through MyChart? Yes  Agent: Please be advised that Rx refills may take up to 3 business days. We ask that you follow-up with your pharmacy.

## 2023-03-08 NOTE — Telephone Encounter (Signed)
 Last office visit 10/17/224 for DM, Left Inguinal Pain, Neuropathy and Umbilical Hernia.  Last refilled:  Not on current medication list but was his historical meds.  Next Appt: CPE 04/27/2023.

## 2023-03-09 DIAGNOSIS — G894 Chronic pain syndrome: Secondary | ICD-10-CM | POA: Diagnosis not present

## 2023-03-09 DIAGNOSIS — Z79891 Long term (current) use of opiate analgesic: Secondary | ICD-10-CM | POA: Diagnosis not present

## 2023-03-09 DIAGNOSIS — M160 Bilateral primary osteoarthritis of hip: Secondary | ICD-10-CM | POA: Diagnosis not present

## 2023-03-09 DIAGNOSIS — Z981 Arthrodesis status: Secondary | ICD-10-CM | POA: Diagnosis not present

## 2023-03-09 DIAGNOSIS — M545 Low back pain, unspecified: Secondary | ICD-10-CM | POA: Diagnosis not present

## 2023-03-14 ENCOUNTER — Other Ambulatory Visit: Payer: Self-pay | Admitting: Family Medicine

## 2023-03-14 DIAGNOSIS — G4700 Insomnia, unspecified: Secondary | ICD-10-CM

## 2023-03-14 NOTE — Telephone Encounter (Signed)
 Last office visit 10/27/2022 for Medical Management of Chronic Issues/Groin Pain.  Last refilled 02/21/2023 for #30 with no refills.  Pharmacy is asking for refill to put on file for next time.  Next Appt: CPE 04/27/23.

## 2023-03-15 ENCOUNTER — Encounter: Payer: Self-pay | Admitting: Family Medicine

## 2023-03-16 ENCOUNTER — Encounter: Payer: Self-pay | Admitting: Family Medicine

## 2023-03-16 MED ORDER — ZOLPIDEM TARTRATE 10 MG PO TABS: 10.0000 mg | ORAL_TABLET | Freq: Every evening | ORAL | 1 refills | Status: DC | PRN

## 2023-03-16 NOTE — Telephone Encounter (Signed)
 Copied from CRM [id number]. Topic: Clinical - Medication Question >> Mar 16, 2023  2:32 PM Adele Barthel wrote: Reason for CRM:   Patient was requesting refill of zolpidem (AMBIEN) 10 MG tablet. Advised a refill was sent on 03/05 to pharmacy on file. He is requesting if additional refills could be called in so that he does not need to contact the clinic monthly.

## 2023-03-20 ENCOUNTER — Other Ambulatory Visit: Payer: Self-pay | Admitting: Family Medicine

## 2023-03-20 NOTE — Telephone Encounter (Signed)
 Copied from CRM [id number]. Topic: Clinical - Medication Refill >> Mar 20, 2023  9:52 AM Eunice Blase wrote: Most Recent Primary Care Visit:  Provider: Kerby Nora E  Department: [facility]  Visit Type: OFFICE VISIT  Date: 10/27/2022  Medication: Semaglutide, 2 MG/DOSE, (OZEMPIC, 2 MG/DOSE,) 8 MG/3ML SOPN  Has the patient contacted their pharmacy? No (Agent: If no, request that the patient contact the pharmacy for the refill. If patient does not wish to contact the pharmacy document the reason why and proceed with request.) (Agent: If yes, when and what did the pharmacy advise?)Pt receives medication at office.   Is this the correct pharmacy for this prescription? No If no, delete pharmacy and type the correct one.  This is the patient's preferred pharmacy:  Medication received at office   Has the prescription been filled recently? Yes  Is the patient out of the medication? Yes  Has the patient been seen for an appointment in the last year OR does the patient have an upcoming appointment? Yes  Can we respond through MyChart? Yes  Agent: Please be advised that Rx refills may take up to 3 business days. We ask that you follow-up with your pharmacy.

## 2023-03-28 ENCOUNTER — Telehealth: Payer: Self-pay | Admitting: Family Medicine

## 2023-03-28 DIAGNOSIS — E1142 Type 2 diabetes mellitus with diabetic polyneuropathy: Secondary | ICD-10-CM

## 2023-03-28 NOTE — Telephone Encounter (Signed)
 Will check microalbumin at upcoming labs prior to his physical April 10

## 2023-04-19 DIAGNOSIS — M4802 Spinal stenosis, cervical region: Secondary | ICD-10-CM | POA: Diagnosis not present

## 2023-04-19 DIAGNOSIS — Z79891 Long term (current) use of opiate analgesic: Secondary | ICD-10-CM | POA: Diagnosis not present

## 2023-04-19 DIAGNOSIS — M25512 Pain in left shoulder: Secondary | ICD-10-CM | POA: Diagnosis not present

## 2023-04-19 DIAGNOSIS — G894 Chronic pain syndrome: Secondary | ICD-10-CM | POA: Diagnosis not present

## 2023-04-19 DIAGNOSIS — M545 Low back pain, unspecified: Secondary | ICD-10-CM | POA: Diagnosis not present

## 2023-04-20 ENCOUNTER — Other Ambulatory Visit: Payer: PPO

## 2023-04-27 ENCOUNTER — Encounter: Payer: PPO | Admitting: Family Medicine

## 2023-05-01 ENCOUNTER — Telehealth (INDEPENDENT_AMBULATORY_CARE_PROVIDER_SITE_OTHER): Payer: Self-pay

## 2023-05-01 NOTE — Telephone Encounter (Signed)
 Patient left a message stating this weekend he started having bilateral calf swelling and his big toenails are turning blue. I did leave a message on his voicemail to return a call to the office. Please Advise

## 2023-05-01 NOTE — Telephone Encounter (Signed)
 He should come in with ABIs

## 2023-05-08 ENCOUNTER — Other Ambulatory Visit (INDEPENDENT_AMBULATORY_CARE_PROVIDER_SITE_OTHER): Payer: Self-pay | Admitting: Nurse Practitioner

## 2023-05-08 DIAGNOSIS — M7989 Other specified soft tissue disorders: Secondary | ICD-10-CM

## 2023-05-08 DIAGNOSIS — R230 Cyanosis: Secondary | ICD-10-CM

## 2023-05-11 ENCOUNTER — Ambulatory Visit (INDEPENDENT_AMBULATORY_CARE_PROVIDER_SITE_OTHER): Admitting: Nurse Practitioner

## 2023-05-11 ENCOUNTER — Other Ambulatory Visit: Payer: Self-pay | Admitting: Podiatry

## 2023-05-11 ENCOUNTER — Encounter (INDEPENDENT_AMBULATORY_CARE_PROVIDER_SITE_OTHER)

## 2023-05-11 ENCOUNTER — Telehealth: Payer: Self-pay | Admitting: Podiatry

## 2023-05-11 NOTE — Telephone Encounter (Signed)
 Patient needs Terbenaphine called in to pharmacy.

## 2023-05-17 DIAGNOSIS — Z79891 Long term (current) use of opiate analgesic: Secondary | ICD-10-CM | POA: Diagnosis not present

## 2023-05-17 DIAGNOSIS — M4802 Spinal stenosis, cervical region: Secondary | ICD-10-CM | POA: Diagnosis not present

## 2023-05-17 DIAGNOSIS — M25512 Pain in left shoulder: Secondary | ICD-10-CM | POA: Diagnosis not present

## 2023-05-17 DIAGNOSIS — M545 Low back pain, unspecified: Secondary | ICD-10-CM | POA: Diagnosis not present

## 2023-05-17 DIAGNOSIS — G894 Chronic pain syndrome: Secondary | ICD-10-CM | POA: Diagnosis not present

## 2023-05-23 ENCOUNTER — Other Ambulatory Visit: Payer: Self-pay | Admitting: Podiatry

## 2023-05-23 ENCOUNTER — Telehealth: Payer: Self-pay | Admitting: Podiatry

## 2023-05-23 MED ORDER — CLOTRIMAZOLE-BETAMETHASONE 1-0.05 % EX CREA: 1.0000 | TOPICAL_CREAM | Freq: Every day | CUTANEOUS | 3 refills | Status: DC

## 2023-05-23 NOTE — Progress Notes (Signed)
 Prior to refill of Lamisil  needs updated CMP which is already ordered in the patient's chart. Once available I can send in a refill. -Dr. Luster Salters

## 2023-05-23 NOTE — Telephone Encounter (Signed)
 Req'ing refill for  terbinafine  (LAMISIL ) 250 MG tablet  ciclopirox  ([place] ) To be sent to pharmacy on file

## 2023-05-29 ENCOUNTER — Telehealth: Payer: Self-pay

## 2023-05-29 NOTE — Telephone Encounter (Signed)
 Copied from CRM [id number]. Topic: General - Other >> May 29, 2023  9:53 AM Howard Macho wrote: Reason for CRM: nikkie from [facility] called stating the doctor did lab work on the patient and they do not see it in epic. [facility] want to see if the office can fax over the lab work CB [phone] Fax [phone] attn:of dr Luster Salters

## 2023-05-29 NOTE — Telephone Encounter (Signed)
 Last labs drawn on 10/27/22 faxed to Dr. Luster Salters as requested.

## 2023-05-30 ENCOUNTER — Encounter (INDEPENDENT_AMBULATORY_CARE_PROVIDER_SITE_OTHER): Payer: Self-pay

## 2023-06-01 ENCOUNTER — Other Ambulatory Visit: Payer: Self-pay | Admitting: Podiatry

## 2023-06-01 MED ORDER — TERBINAFINE HCL 250 MG PO TABS: 250.0000 mg | ORAL_TABLET | Freq: Every day | ORAL | 0 refills | Status: DC

## 2023-06-01 NOTE — Progress Notes (Signed)
 CMP received from PCP. LFT WNL. Refill Lamisil  250 mg #90 daily.  Dot Gazella, DPM [facility]  Dr. Dot Gazella, DPM    [address] [place], [place] [address]                Office [phone]  Fax [phone]

## 2023-06-02 DIAGNOSIS — G8929 Other chronic pain: Secondary | ICD-10-CM | POA: Diagnosis not present

## 2023-06-02 DIAGNOSIS — M25511 Pain in right shoulder: Secondary | ICD-10-CM | POA: Diagnosis not present

## 2023-06-02 DIAGNOSIS — Z96642 Presence of left artificial hip joint: Secondary | ICD-10-CM | POA: Diagnosis not present

## 2023-06-02 DIAGNOSIS — M5489 Other dorsalgia: Secondary | ICD-10-CM | POA: Diagnosis not present

## 2023-06-02 DIAGNOSIS — M7632 Iliotibial band syndrome, left leg: Secondary | ICD-10-CM | POA: Diagnosis not present

## 2023-06-09 ENCOUNTER — Ambulatory Visit (INDEPENDENT_AMBULATORY_CARE_PROVIDER_SITE_OTHER): Admitting: Nurse Practitioner

## 2023-06-09 ENCOUNTER — Encounter (INDEPENDENT_AMBULATORY_CARE_PROVIDER_SITE_OTHER)

## 2023-06-14 DIAGNOSIS — Z79891 Long term (current) use of opiate analgesic: Secondary | ICD-10-CM | POA: Diagnosis not present

## 2023-06-14 DIAGNOSIS — M25512 Pain in left shoulder: Secondary | ICD-10-CM | POA: Diagnosis not present

## 2023-06-14 DIAGNOSIS — M545 Low back pain, unspecified: Secondary | ICD-10-CM | POA: Diagnosis not present

## 2023-06-14 DIAGNOSIS — M160 Bilateral primary osteoarthritis of hip: Secondary | ICD-10-CM | POA: Diagnosis not present

## 2023-06-14 DIAGNOSIS — G894 Chronic pain syndrome: Secondary | ICD-10-CM | POA: Diagnosis not present

## 2023-06-14 DIAGNOSIS — M4802 Spinal stenosis, cervical region: Secondary | ICD-10-CM | POA: Diagnosis not present

## 2023-06-23 ENCOUNTER — Other Ambulatory Visit: Payer: Self-pay | Admitting: Family Medicine

## 2023-06-23 DIAGNOSIS — G4700 Insomnia, unspecified: Secondary | ICD-10-CM

## 2023-06-23 MED ORDER — ZOLPIDEM TARTRATE 10 MG PO TABS: 10.0000 mg | ORAL_TABLET | Freq: Every evening | ORAL | 1 refills | Status: DC | PRN

## 2023-06-23 NOTE — Telephone Encounter (Signed)
 Copied from CRM [id number]. Topic: Clinical - Medication Refill >> Jun 23, 2023 10:47 AM Marlan Silva wrote: Medication:  zolpidem  (AMBIEN ) 10 MG tablet   Has the patient contacted their pharmacy? Yes (Agent: If no, request that the patient contact the pharmacy for the refill. If patient does not wish to contact the pharmacy document the reason why and proceed with request.) (Agent: If yes, when and what did the pharmacy advise?)  This is the patient's preferred pharmacy:  [facility] - [place], [place] - [address] [place] [address] Phone: [phone] Fax: [phone]  Is this the correct pharmacy for this prescription? Yes If no, delete pharmacy and type the correct one.   Has the prescription been filled recently? Yes  Is the patient out of the medication? Yes  Has the patient been seen for an appointment in the last year OR does the patient have an upcoming appointment? Yes  Can we respond through MyChart? Yes  Agent: Please be advised that Rx refills may take up to 3 business days. We ask that you follow-up with your pharmacy.

## 2023-06-23 NOTE — Telephone Encounter (Signed)
 Last office visit 10/27/22 for DM.  Last refilled 03/16/23 for #30 with 1 refill.  Next appt: No future appointments with PCP.

## 2023-06-28 ENCOUNTER — Telehealth: Payer: Self-pay

## 2023-06-28 NOTE — Telephone Encounter (Signed)
 Copied from CRM [id number]. Topic: Clinical - Medication Question >> Jun 28, 2023  1:04 PM Deaijah H wrote: Reason for CRM: Patient called in stating he would like to get on one of the medication that he can wear on arm to monitor sugar believes it's called Erik Martinez or Dexcom stated medicare covers those. Please call [phone] with more information

## 2023-06-29 MED ORDER — FREESTYLE LIBRE 3 PLUS SENSOR MISC: 2 refills | Status: DC

## 2023-06-29 NOTE — Addendum Note (Signed)
 Addended by: Herby Lolling E on: 06/29/2023 06:39 PM   Modules accepted: Orders

## 2023-06-29 NOTE — Telephone Encounter (Signed)
 Please call patient and have him make an appointment.  He is greater than 6 months from his last diabetes follow-up.  I will go ahead and send in a prescription for his requested monitor.

## 2023-06-30 NOTE — Telephone Encounter (Signed)
 lvm for pt to call office to schedule appt for diabetes follow-up.

## 2023-07-04 ENCOUNTER — Ambulatory Visit: Payer: Self-pay

## 2023-07-04 ENCOUNTER — Other Ambulatory Visit

## 2023-07-04 DIAGNOSIS — S6992XA Unspecified injury of left wrist, hand and finger(s), initial encounter: Secondary | ICD-10-CM | POA: Diagnosis not present

## 2023-07-04 DIAGNOSIS — S59912A Unspecified injury of left forearm, initial encounter: Secondary | ICD-10-CM | POA: Diagnosis not present

## 2023-07-04 DIAGNOSIS — S52135A Nondisplaced fracture of neck of left radius, initial encounter for closed fracture: Secondary | ICD-10-CM | POA: Diagnosis not present

## 2023-07-04 NOTE — Telephone Encounter (Signed)
 Noted
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
## 2023-07-05 ENCOUNTER — Telehealth: Payer: Self-pay

## 2023-07-05 NOTE — Telephone Encounter (Signed)
 Copied from CRM [id number]. Topic: General - Other >> Jul 05, 2023  3:03 PM Rosina BIRCH wrote: Reason for CRM: patient called wanting to see if his ozempic  has came into the office. I contacted [facility] and they told me it was not there and they will contact the patient when it does come in. The patient states that he only has one left.

## 2023-07-06 DIAGNOSIS — Z79891 Long term (current) use of opiate analgesic: Secondary | ICD-10-CM | POA: Diagnosis not present

## 2023-07-06 DIAGNOSIS — M160 Bilateral primary osteoarthritis of hip: Secondary | ICD-10-CM | POA: Diagnosis not present

## 2023-07-06 DIAGNOSIS — G894 Chronic pain syndrome: Secondary | ICD-10-CM | POA: Diagnosis not present

## 2023-07-06 DIAGNOSIS — M545 Low back pain, unspecified: Secondary | ICD-10-CM | POA: Diagnosis not present

## 2023-07-06 DIAGNOSIS — M25512 Pain in left shoulder: Secondary | ICD-10-CM | POA: Diagnosis not present

## 2023-07-06 DIAGNOSIS — M4802 Spinal stenosis, cervical region: Secondary | ICD-10-CM | POA: Diagnosis not present

## 2023-07-07 ENCOUNTER — Other Ambulatory Visit: Payer: Self-pay | Admitting: Family Medicine

## 2023-07-07 MED ORDER — CELECOXIB 200 MG PO CAPS: 200.0000 mg | ORAL_CAPSULE | Freq: Two times a day (BID) | ORAL | 2 refills | Status: DC

## 2023-07-07 NOTE — Telephone Encounter (Signed)
 Copied from CRM [id number]. Topic: Clinical - Medication Refill >> Jul 07, 2023  3:03 PM Chiquita SQUIBB wrote: Medication:  celecoxib  celecoxib  (CELEBREX ) 200 MG capsule   Has the patient contacted their pharmacy? Yes- pharmacy stated they have reached out to the doctor (Agent: If no, request that the patient contact the pharmacy for the refill. If patient does not wish to contact the pharmacy document the reason why and proceed with request.) (Agent: If yes, when and what did the pharmacy advise?)  This is the patient's preferred pharmacy:  [facility] - [place], [place] - [address] [name] [name] [address] Phone: [phone] Fax: [phone]  Is this the correct pharmacy for this prescription? Yes If no, delete pharmacy and type the correct one.   Has the prescription been filled recently? No  Is the patient out of the medication? Yes  Has the patient been seen for an appointment in the last year OR does the patient have an upcoming appointment? Yes  Can we respond through MyChart? Yes  Agent: Please be advised that Rx refills may take up to 3 business days. We ask that you follow-up with your pharmacy.

## 2023-07-07 NOTE — Telephone Encounter (Signed)
 Last office visit 10/27/2022 for DM.  Last refilled 05/24/20254 for #30 with 2 refills by Fonda Barer, PA-C.  Next Appt: No future appointments with PCP.  Refill?

## 2023-07-11 DIAGNOSIS — S52135A Nondisplaced fracture of neck of left radius, initial encounter for closed fracture: Secondary | ICD-10-CM | POA: Diagnosis not present

## 2023-07-11 DIAGNOSIS — S52502A Unspecified fracture of the lower end of left radius, initial encounter for closed fracture: Secondary | ICD-10-CM | POA: Diagnosis not present

## 2023-07-11 DIAGNOSIS — S52134A Nondisplaced fracture of neck of right radius, initial encounter for closed fracture: Secondary | ICD-10-CM | POA: Diagnosis not present

## 2023-07-17 ENCOUNTER — Ambulatory Visit: Payer: Self-pay

## 2023-07-17 NOTE — Telephone Encounter (Signed)
 FYI Only or Action Required?: FYI only for provider.  Patient was last seen in primary care on 10/27/2022 by [name], MD. Called Nurse Triage reporting Pain. Symptoms began x 4 weeks. Interventions attempted: OTC medications: prescription pain medication. Symptoms are: gradually worsening.  Triage Disposition: See PCP When Office is Open (Within 3 Days)  Patient/caregiver understands and will follow disposition?: Yes    Copied from CRM [id number]. Topic: Clinical - Red Word Triage >> Jul 17, 2023 12:52 PM Rosina BIRCH wrote: Reason for CRM: patient called stating he took a fall four weeks ago and broke his arm. Patient stated up under his bottom right rib he is continuing to have pain and it is getting worse Reason for Disposition  [1] MODERATE pain (e.g., interferes with normal activities) AND [2] present > 3 days  Answer Assessment - Initial Assessment Questions 1. ONSET: When did the muscle aches or body pains start?      X 4 weeks and worsening 2. LOCATION: What part of your body is hurting? (e.g., entire body, arms, legs)      Right rib area 3. SEVERITY: How bad is the pain? (Scale 1-10; or mild, moderate, severe)   - MILD (1-3): doesn't interfere with normal activities    - MODERATE (4-7): interferes with normal activities or awakens from sleep    - SEVERE (8-10):  excruciating pain, unable to do any normal activities      7/10 4. CAUSE: What do you think is causing the pains?     Possible fall  5. FEVER: Have you been having fever?     no 6. OTHER SYMPTOMS: Do you have any other symptoms? (e.g., chest pain, weakness, rash, cold or flu symptoms, weight loss)     Weakness -generalized 7. PREGNANCY: Is there any chance you are pregnant? When was your last menstrual period?     na 8. TRAVEL: Have you traveled out of the country in the last month? (e.g., travel history, exposures)     N/a  Pt fell x 4 weeks ago and broke arm  Protocols used: Muscle Aches and  Body Pain-A-AH

## 2023-07-17 NOTE — Telephone Encounter (Signed)
 Appointment scheduled with Dr. Avelina 07/18/23.

## 2023-07-18 ENCOUNTER — Ambulatory Visit: Admitting: Family Medicine

## 2023-07-18 ENCOUNTER — Encounter: Payer: Self-pay | Admitting: Family Medicine

## 2023-07-18 ENCOUNTER — Ambulatory Visit: Payer: Self-pay | Admitting: Family Medicine

## 2023-07-18 VITALS — BP 116/60 | HR 87 | Temp 97.8°F | Ht 70.0 in | Wt 179.4 lb

## 2023-07-18 DIAGNOSIS — G4700 Insomnia, unspecified: Secondary | ICD-10-CM

## 2023-07-18 DIAGNOSIS — R0789 Other chest pain: Secondary | ICD-10-CM | POA: Insufficient documentation

## 2023-07-18 DIAGNOSIS — E1142 Type 2 diabetes mellitus with diabetic polyneuropathy: Secondary | ICD-10-CM

## 2023-07-18 DIAGNOSIS — M25559 Pain in unspecified hip: Secondary | ICD-10-CM | POA: Diagnosis not present

## 2023-07-18 LAB — COMPREHENSIVE METABOLIC PANEL WITH GFR
ALT: 7 U/L (ref 0–53)
AST: 11 U/L (ref 0–37)
Albumin: 3.6 g/dL (ref 3.5–5.2)
Alkaline Phosphatase: 79 U/L (ref 39–117)
BUN: 17 mg/dL (ref 6–23)
CO2: 27 meq/L (ref 19–32)
Calcium: 8.7 mg/dL (ref 8.4–10.5)
Chloride: 101 meq/L (ref 96–112)
Creatinine, Ser: 1.03 mg/dL (ref 0.40–1.50)
GFR: 75.62 mL/min (ref >60.00)
Glucose, Bld: 111 mg/dL — ABNORMAL HIGH (ref 70–99)
Potassium: 4.4 meq/L (ref 3.5–5.1)
Sodium: 136 meq/L (ref 135–145)
Total Bilirubin: 0.4 mg/dL (ref 0.2–1.2)
Total Protein: 6.9 g/dL (ref 6.0–8.3)

## 2023-07-18 LAB — MICROALBUMIN / CREATININE URINE RATIO
Creatinine,U: 34.4 mg/dL
Microalb Creat Ratio: UNDETERMINED mg/g (ref 0.0–30.0)
Microalb, Ur: <0.7 mg/dL

## 2023-07-18 LAB — HEMOGLOBIN A1C: Hgb A1c MFr Bld: 7.4 % — ABNORMAL HIGH (ref 4.6–6.5)

## 2023-07-18 LAB — LIPID PANEL
Cholesterol: 118 mg/dL (ref 0–200)
HDL: 52.7 mg/dL (ref >39.00)
LDL Cholesterol: 54 mg/dL (ref 0–99)
NonHDL: 65.73
Total CHOL/HDL Ratio: 2
Triglycerides: 60 mg/dL (ref 0.0–149.0)
VLDL: 12 mg/dL (ref 0.0–40.0)

## 2023-07-18 MED ORDER — LIDOCAINE 5 % EX PTCH: 3.0000 | MEDICATED_PATCH | CUTANEOUS | 0 refills | Status: AC

## 2023-07-18 MED ORDER — ZOLPIDEM TARTRATE 10 MG PO TABS: 10.0000 mg | ORAL_TABLET | Freq: Every evening | ORAL | 1 refills | Status: DC | PRN

## 2023-07-18 NOTE — Progress Notes (Signed)
 Patient ID: Erik Martinez, male    DOB: [birth year], 68 y.o.   MRN: [id number]  This visit was conducted in person.  BP 116/60   Pulse 87   Temp 97.8 F (36.6 C) (Temporal)   Ht 5' 10 (1.778 m)   Wt 179 lb 6 oz (81.4 kg)   SpO2 99%   BMI 25.74 kg/m    CC:  Chief Complaint  Patient presents with   Fall    2 weeks ago and broke left arm   Rib Injury    Pain in Right Ribs    Subjective:   HPI: Erik Martinez is a 68 y.o. male presenting on 07/18/2023 for Fall (2 weeks ago and broke left arm) and Rib Injury (Pain in Right Ribs)  Patient reports 2 weeks ago he fell accidentally and broke his left arm.  Has fallen 2 time... fell over dog once and tripped on 2x4. Seeing [facility]. Radial fracture.. recommended wrapping with ACE to preserve ROM.  He is currently complaining of pain in his right ribs.. right arm pressed into side during the fall.  Bruising has resolved now. faint contusion.   Pain in arm and right side is limiting him at work.  By end of the day he is very uncomfortable. He is also concerned that there may be underlying injury in the soft tissue under his right rib.   No SOB, no cough, no fever.   On celebrex  , lidocaine  patches. Taking oxycodone  20 mg every 6hours. Lyrica  200 mg TID. Patch does seem to help, but does not have enough.     Relevant past medical, surgical, family and social history reviewed and updated as indicated. Interim medical history since our last visit reviewed. Allergies and medications reviewed and updated. Outpatient Medications Prior to Visit  Medication Sig Dispense Refill   atorvastatin  (LIPITOR) 20 MG tablet Take 20 mg by mouth at bedtime.     celecoxib  (CELEBREX ) 200 MG capsule Take 1 capsule (200 mg total) by mouth 2 (two) times daily. 30 capsule 2   Continuous Glucose Sensor (FREESTYLE LIBRE 3 PLUS SENSOR) MISC Change sensor every 15 days. 1 each 2   cyclobenzaprine  (FLEXERIL ) 10 MG tablet 3 (three) times  daily.     empagliflozin  (JARDIANCE ) 25 MG TABS tablet Take 1 tablet (25 mg total) by mouth daily. 90 tablet 1   insulin  aspart (NOVOLOG ) 100 UNIT/ML injection Per sliding scale  MAX 15 units in 24 hour period. 10 mL PRN   metFORMIN  (GLUCOPHAGE ) 1000 MG tablet Take 1,000 mg by mouth 2 (two) times daily.     ondansetron  (ZOFRAN ) 4 MG tablet Take 1 tablet (4 mg total) by mouth every 8 (eight) hours as needed for nausea or vomiting. 20 tablet 1   Oxycodone  HCl 20 MG TABS Take 1 tablet by mouth every 6 (six) hours as needed.     pantoprazole  (PROTONIX ) 40 MG tablet Take 1 tablet (40 mg total) by mouth daily. 90 tablet 3   pregabalin  (LYRICA ) 200 MG capsule Take 1 capsule (200 mg total) by mouth in the morning, at noon, and at bedtime. 90 capsule 0   Semaglutide , 2 MG/DOSE, (OZEMPIC , 2 MG/DOSE,) 8 MG/3ML SOPN Inject 2 mg into the skin once a week.     tadalafil (CIALIS) 20 MG tablet Take 20 mg by mouth daily as needed for erectile dysfunction.     terbinafine  (LAMISIL ) 250 MG tablet Take 1 tablet (250 mg total) by mouth daily.  90 tablet 0   lidocaine  (LIDODERM ) 5 % Place 1 patch onto the skin daily. Remove & Discard patch within 12 hours or as directed by MD 30 patch 0   zolpidem  (AMBIEN ) 10 MG tablet Take 1 tablet (10 mg total) by mouth at bedtime as needed. for sleep 30 tablet 1   acetaminophen  (TYLENOL ) 500 MG tablet Take 1,500 mg by mouth 2 (two) times daily as needed for moderate pain or mild pain.     ALPRAZolam  (XANAX ) 0.5 MG tablet Take 1-2 tablets (0.5-1 mg total) by mouth once as needed for up to 1 dose (prior to procedure). 2 tablet 0   cefadroxil  (DURICEF) 500 MG capsule Take 1 capsule (500 mg total) by mouth 2 (two) times daily. 60 capsule 3   clotrimazole -betamethasone  (LOTRISONE ) cream Apply 1 Application topically daily. 45 g 3   ferrous sulfate  324 MG TBEC TAKE ONE TABLET BY MOUTH MONDAY,WEDNESDAY,FRIDAY     Lactobacillus (PROBIOTIC ACIDOPHILUS PO) Take by mouth.     oxyCODONE  (OXY  IR/ROXICODONE ) 5 MG immediate release tablet Take 1 tablet (5 mg total) by mouth every 4 (four) hours as needed for moderate pain (pain score 4-6). 30 tablet 0   tiZANidine  (ZANAFLEX ) 4 MG tablet Take 4 mg by mouth 3 (three) times daily as needed.     triamcinolone  cream (KENALOG ) 0.5 % Apply 1 Application topically 2 (two) times daily. 30 g 0   No facility-administered medications prior to visit.     Per HPI unless specifically indicated in ROS section below Review of Systems  Constitutional:  Negative for fatigue and fever.  HENT:  Negative for ear pain.   Eyes:  Negative for pain.  Respiratory:  Negative for cough and shortness of breath.   Cardiovascular:  Positive for chest pain. Negative for palpitations and leg swelling.  Gastrointestinal:  Negative for abdominal pain.  Genitourinary:  Negative for dysuria.  Musculoskeletal:  Negative for arthralgias.  Neurological:  Negative for syncope, light-headedness and headaches.  Psychiatric/Behavioral:  Negative for dysphoric mood.    Objective:  BP 116/60   Pulse 87   Temp 97.8 F (36.6 C) (Temporal)   Ht 5' 10 (1.778 m)   Wt 179 lb 6 oz (81.4 kg)   SpO2 99%   BMI 25.74 kg/m   Wt Readings from Last 3 Encounters:  07/18/23 179 lb 6 oz (81.4 kg)  01/24/23 189 lb (85.7 kg)  10/27/22 189 lb 6 oz (85.9 kg)      Physical Exam Vitals reviewed.  Constitutional:      Appearance: He is well-developed.  HENT:     Head: Normocephalic.     Right Ear: Hearing normal.     Left Ear: Hearing normal.     Nose: Nose normal.  Neck:     Thyroid : No thyroid  mass or thyromegaly.     Vascular: No carotid bruit.     Trachea: Trachea normal.  Cardiovascular:     Rate and Rhythm: Normal rate and regular rhythm.     Pulses: Normal pulses.     Heart sounds: Heart sounds not distant. No murmur heard.    No friction rub. No gallop.     Comments: No peripheral edema Pulmonary:     Effort: Pulmonary effort is normal. No respiratory distress.      Breath sounds: Normal breath sounds.  Chest:     Chest wall: Tenderness present. No mass, lacerations, deformity or swelling.    Abdominal:     Tenderness: There is  abdominal tenderness in the right upper quadrant. There is no right CVA tenderness, left CVA tenderness, guarding or rebound.     Comments: mild  Skin:    General: Skin is warm and dry.     Findings: No rash.  Psychiatric:        Speech: Speech normal.        Behavior: Behavior normal.        Thought Content: Thought content normal.       Results for orders placed or performed in visit on 03/15/23  HM DIABETES EYE EXAM   Collection Time: 01/24/23  9:59 AM  Result Value Ref Range   HM Diabetic Eye Exam No Retinopathy No Retinopathy    Assessment and Plan  Right-sided chest wall pain Assessment & Plan: Acute, most likely contusion/rib fracture. Will check liver function tests given patient concern of liver injury although he is focally tender over rib and only mildly tender over liver. Discussed considering rib films but we have opted against it.  I have sent in a prescription for him to use lidocaine  patches not only for chronic use but also it acutely in his right chest and on left arm.  He will continue other pain medication per the [place].   Insomnia, unspecified type -     Zolpidem  Tartrate; Take 1 tablet (10 mg total) by mouth at bedtime as needed. for sleep  Dispense: 30 tablet; Refill: 1  Hip pain -     Lidocaine ; Place 3 patches onto the skin daily. Remove & Discard patch within 12 hours or as directed by MD  Dispense: 90 patch; Refill: 0  Type 2 diabetes mellitus with diabetic polyneuropathy, without long-term current use of insulin  [facility]) Assessment & Plan: Evaluate routine diabetes testing given patient is due. He will schedule diabetes follow-up/physical in 4 weeks.  Orders: -     Hemoglobin A1c -     Lipid panel -     Comprehensive metabolic panel with GFR -     Microalbumin / creatinine  urine ratio    Return in about 4 weeks (around 08/15/2023) for annual physical , no labs prior.   Greig Ring, MD

## 2023-07-18 NOTE — Assessment & Plan Note (Signed)
 Acute, most likely contusion/rib fracture. Will check liver function tests given patient concern of liver injury although he is focally tender over rib and only mildly tender over liver. Discussed considering rib films but we have opted against it.  I have sent in a prescription for him to use lidocaine  patches not only for chronic use but also it acutely in his right chest and on left arm.  He will continue other pain medication per the [place].

## 2023-07-18 NOTE — Assessment & Plan Note (Signed)
 Evaluate routine diabetes testing given patient is due. He will schedule diabetes follow-up/physical in 4 weeks.

## 2023-07-19 ENCOUNTER — Ambulatory Visit: Payer: Self-pay

## 2023-07-19 NOTE — Telephone Encounter (Signed)
 FYI Only or Action Required?: Action required by provider: update on patient condition.  Patient was last seen in primary care on 07/18/2023 by [name], MD.  Called Nurse Triage reporting Chest Pain.  Symptoms began several weeks ago.  Interventions attempted: OTC medications: lidocaine  patches, 20mg  oxycodone .  Symptoms are: right lower rib paingradually worsening.  Triage Disposition: Call PCP Now  Patient/caregiver understands and will follow disposition?: Yes           Read PCP's note from lab results: Your liver function is normal.  There is no evidence of liver issue resulting from fall. Your diabetes control has worsened some in the last 8 months.  We will discuss this in detail at your upcoming office visit.   1. MAIN CONCERN OR SYMPTOM: What is your main concern right now? What question do you have? What's the main symptom you're worried about? (e.g., fever, pain, redness, swelling) Right lower rib pain worsening.  2. ONSET: When did the pain start? x2.5 weeks. 3. BETTER-SAME-WORSE: Are you getting better, staying the same, or getting worse compared to how you felt at your last visit to the doctor (most recent medical visit)? Worsened.  4. VISIT DATE: When were you seen? (e.g., date) Yesterday, 07/18/23.  5. VISIT DOCTOR: What is the name of the doctor taking care of you now? Dr Avelina.  6. VISIT DIAGNOSIS: What was the main symptom or problem that you were seen for? Were you given a diagnosis?  Right sided chest wall pain.  7. VISIT TREATMENT: What treatment did you receive? (e.g., staples, sutures, splint, cast) Patient was sent to have blood work to check his liver function  8. VISIT MEDICINES: Did the doctor order any new medicines for you to use? If Yes, ask: Have you filled the prescription and started taking the medicine?  Lidocaine  patches.  9. NEXT APPOINTMENT: Have you scheduled a follow-up appointment with your  doctor? 08/15/23.  10. PAIN: Is there any pain? If Yes, ask: How bad is it? (Scale 0-10; or none, mild, moderate, severe) - NONE (0): No pain. - MILD (1-3): Doesn't interfere with normal activities.  - MODERATE (4-7): Interferes with normal activities or awakens from sleep.  - SEVERE (8-10): Excruciating pain, unable to do any normal activities. 7/10.  11. FEVER: Do you have a fever? If Yes, ask: What is it, how was it measured and when did it start? No.  12. OTHER SYMPTOMS: Do you have any other symptoms? None.  13. PREGNANCY: Is there any chance you are pregnant? When was your last menstrual period?  N/A.    Copied from CRM [id number]. Topic: Clinical - Red Word Triage >> Jul 19, 2023  3:12 PM Suzen RAMAN wrote: Red Word that prompted transfer to Nurse Triage: Severe Pain Side Reason for Disposition  [1] SEVERE pain (e.g., excruciating, pain scale 8-10) AND [2] not improved after pain medications  Protocols used: Recent Medical Visit for Injury Follow-up Call-A-AH

## 2023-07-20 NOTE — Telephone Encounter (Signed)
 Contact patient.  If pain is severe he may want to contact his pain doctor for adjustment of his medication temporarily.  We can also have him set up for a chest x-ray.  Let me know if he wants to move forward with the x-ray.

## 2023-07-21 NOTE — Telephone Encounter (Signed)
 Left message for Erik Martinez to return call to office.

## 2023-07-24 NOTE — Telephone Encounter (Signed)
 Left message for [name] to return call to office.  If patient calls back please relay message below from Dr. Avelina as well as lab results:   Your liver function is normal.  There is no evidence of liver issue resulting from fall. Your diabetes control has worsened some in the last 8 months.  We will discuss this in detail at your upcoming office visit.

## 2023-07-24 NOTE — Telephone Encounter (Signed)
 I have been unable to reach patient by phone. Letter with lab results as well as Dr. Sherrel recommendations for right lower rib pain.  It also looks like triage nurse did give patient his lab results as well.  FYI to Dr. Avelina.

## 2023-07-25 NOTE — Telephone Encounter (Signed)
 Noted

## 2023-07-28 ENCOUNTER — Other Ambulatory Visit

## 2023-08-01 ENCOUNTER — Other Ambulatory Visit: Payer: Self-pay | Admitting: Family Medicine

## 2023-08-04 ENCOUNTER — Telehealth: Payer: Self-pay

## 2023-08-04 ENCOUNTER — Telehealth: Payer: Self-pay | Admitting: Family Medicine

## 2023-08-04 NOTE — Telephone Encounter (Signed)
 Copied from CRM [id number]. Topic: General - Other >> Aug 04, 2023 12:00 PM Drema MATSU wrote: Reason for CRM: Patient is calling for an update on his ozempic  medication. He wants to know if its at the clinic because he is out. Please call patient to advise.

## 2023-08-04 NOTE — Telephone Encounter (Signed)
 PAP: Patient assistance application for Ozempic  through [employer] has been mailed to pt's home address on file. Provider portion of application will be faxed to provider's office. E-filed patient's portion for re-enrollment. Previous enrollment ended on 03/07/23.

## 2023-08-04 NOTE — Telephone Encounter (Signed)
 According to letter in chart.  He would received medication through 03/02/2023.  Will send to Rx Med Assistance Team to start new application for patient.

## 2023-08-07 NOTE — Telephone Encounter (Signed)
 Fax provider portion today,to follow up in a few days.

## 2023-08-08 NOTE — Telephone Encounter (Signed)
 Physician Section completed and faxed to [employer] at [phone].

## 2023-08-09 ENCOUNTER — Telehealth: Payer: Self-pay | Admitting: Family Medicine

## 2023-08-09 DIAGNOSIS — G4700 Insomnia, unspecified: Secondary | ICD-10-CM

## 2023-08-09 NOTE — Telephone Encounter (Signed)
 It was faxed and has been sent out to be scanned into chart.

## 2023-08-09 NOTE — Telephone Encounter (Unsigned)
 Copied from CRM [id number]. Topic: Clinical - Medication Question >> Aug 09, 2023  3:50 PM Robinson H wrote: Reason for CRM: Patient states Pharmacist was supposed to send him paperwork to complete for his Ozempic  and has yet to receive it, states this is the second time that it was being sent out to him and he's a little upset please reach out.   Dempsey [phone]

## 2023-08-09 NOTE — Telephone Encounter (Signed)
 Copied from CRM [id number]. Topic: General - Other >> Aug 04, 2023 12:00 PM Drema MATSU wrote: Reason for CRM: Patient is calling for an update on his ozempic  medication. He wants to know if its at the clinic because he is out. Please call patient to advise. >> Aug 09, 2023  2:36 PM Rosina BIRCH wrote: Jerico from [employer] called stating she need the new updated version form for the patient and provider. Jerico stated she will fax a new one to the office if they do not have one  CB [phone]  Fax-[phone] Patient ID# [id number] >> Aug 04, 2023  3:53 PM Holy Battenfield E wrote: Patient calling asking what is going on with Ozempic  medication  Patient is very frustrated and stated the office has dropped the ball. Patient would like to speak with someone in the office regarding this issue. CAL stated to read patient this note:Farrar, Suzen SQUIBB, CPhT    08/04/23  3:50 PM Note PAP: Patient assistance application for Ozempic  through [employer] has been mailed to pt's home address on file. Provider portion of application will be faxed to provider's office. E-filed patient's portion for re-enrollment. Previous enrollment ended on 03/07/23.

## 2023-08-09 NOTE — Telephone Encounter (Signed)
 See message below.  Physician section was faxed back yesterday 087/28/25 at 11:29 am.

## 2023-08-09 NOTE — Telephone Encounter (Signed)
 Mr. Portner should have an available refill on his Zolpidem  at his pharmacy.   I called and spoke with [facility].  Patient last refilled his Zolpidem  on 07/21/2023 so it is too soon for refill.  He does have a refill on file that he can fill on 08/21/23.

## 2023-08-09 NOTE — Telephone Encounter (Signed)
 Copied from CRM [id number]. Topic: Clinical - Medication Refill >> Aug 09, 2023  3:49 PM Robinson H wrote: Medication: zolpidem  (AMBIEN ) 10 MG tablet  Has the patient contacted their pharmacy? Yes (Agent: If no, request that the patient contact the pharmacy for the refill. If patient does not wish to contact the pharmacy document the reason why and proceed with request.) (Agent: If yes, when and what did the pharmacy advise?)  This is the patient's preferred pharmacy:  [facility] - [place], [place] - [name] [address] Phone: [phone] Fax: [phone]  Is this the correct pharmacy for this prescription? Yes If no, delete pharmacy and type the correct one.   Has the prescription been filled recently? No  Is the patient out of the medication? No  Has the patient been seen for an appointment in the last year OR does the patient have an upcoming appointment? Yes  Can we respond through MyChart? Yes  Agent: Please be advised that Rx refills may take up to 3 business days. We ask that you follow-up with your pharmacy.

## 2023-08-10 DIAGNOSIS — M545 Low back pain, unspecified: Secondary | ICD-10-CM | POA: Diagnosis not present

## 2023-08-10 DIAGNOSIS — M25512 Pain in left shoulder: Secondary | ICD-10-CM | POA: Diagnosis not present

## 2023-08-10 DIAGNOSIS — Z79891 Long term (current) use of opiate analgesic: Secondary | ICD-10-CM | POA: Diagnosis not present

## 2023-08-10 DIAGNOSIS — M4802 Spinal stenosis, cervical region: Secondary | ICD-10-CM | POA: Diagnosis not present

## 2023-08-10 DIAGNOSIS — G894 Chronic pain syndrome: Secondary | ICD-10-CM | POA: Diagnosis not present

## 2023-08-10 NOTE — Telephone Encounter (Signed)
 Physician portion signed and faxed back to [phone].

## 2023-08-10 NOTE — Telephone Encounter (Signed)
 Received provider portion today and fax it to [employer] will follow up in a couple of days.

## 2023-08-14 NOTE — Telephone Encounter (Signed)
 Receive approval letter from [employer] on Ozempic  thru 08/04/2024,left a HIPAA VM at pt number. Approval letter has been index

## 2023-08-15 ENCOUNTER — Encounter: Admitting: Family Medicine

## 2023-08-17 ENCOUNTER — Telehealth: Payer: Self-pay | Admitting: Family Medicine

## 2023-08-17 NOTE — Telephone Encounter (Signed)
 Copied from CRM [id number]. Topic: Clinical - Prescription Issue >> Aug 17, 2023  8:57 AM Donna BRAVO wrote: Reason for CRM: patient calling in stating he received a mailed letter for [employer] Patient Assistance Program Application, the HCP application part needs to be filled out and faxed back in. This application has been denied.   Patient would like to speak with Arland Morel CMA  Patient did not show for appt on 08/15/23   Patient was made aware their call will be returned in 1 business day.

## 2023-08-18 ENCOUNTER — Encounter: Payer: Self-pay | Admitting: Family Medicine

## 2023-08-18 ENCOUNTER — Ambulatory Visit (INDEPENDENT_AMBULATORY_CARE_PROVIDER_SITE_OTHER): Admitting: Family Medicine

## 2023-08-18 VITALS — BP 112/60 | HR 88 | Temp 98.0°F | Ht 68.0 in | Wt 189.1 lb

## 2023-08-18 DIAGNOSIS — Z125 Encounter for screening for malignant neoplasm of prostate: Secondary | ICD-10-CM | POA: Diagnosis not present

## 2023-08-18 DIAGNOSIS — Z0000 Encounter for general adult medical examination without abnormal findings: Secondary | ICD-10-CM

## 2023-08-18 DIAGNOSIS — I739 Peripheral vascular disease, unspecified: Secondary | ICD-10-CM

## 2023-08-18 DIAGNOSIS — R319 Hematuria, unspecified: Secondary | ICD-10-CM | POA: Diagnosis not present

## 2023-08-18 DIAGNOSIS — R600 Localized edema: Secondary | ICD-10-CM | POA: Diagnosis not present

## 2023-08-18 DIAGNOSIS — E1169 Type 2 diabetes mellitus with other specified complication: Secondary | ICD-10-CM | POA: Diagnosis not present

## 2023-08-18 DIAGNOSIS — R0602 Shortness of breath: Secondary | ICD-10-CM | POA: Diagnosis not present

## 2023-08-18 DIAGNOSIS — Z1211 Encounter for screening for malignant neoplasm of colon: Secondary | ICD-10-CM

## 2023-08-18 DIAGNOSIS — R8290 Unspecified abnormal findings in urine: Secondary | ICD-10-CM

## 2023-08-18 DIAGNOSIS — K5909 Other constipation: Secondary | ICD-10-CM | POA: Diagnosis not present

## 2023-08-18 DIAGNOSIS — E1142 Type 2 diabetes mellitus with diabetic polyneuropathy: Secondary | ICD-10-CM | POA: Diagnosis not present

## 2023-08-18 DIAGNOSIS — R0600 Dyspnea, unspecified: Secondary | ICD-10-CM | POA: Insufficient documentation

## 2023-08-18 DIAGNOSIS — E785 Hyperlipidemia, unspecified: Secondary | ICD-10-CM | POA: Diagnosis not present

## 2023-08-18 LAB — POC URINALSYSI DIPSTICK (AUTOMATED)
Bilirubin, UA: NEGATIVE
Glucose, UA: POSITIVE — AB
Ketones, UA: NEGATIVE
Leukocytes, UA: NEGATIVE
Nitrite, UA: NEGATIVE
Protein, UA: POSITIVE — AB
Spec Grav, UA: 1.01 (ref 1.010–1.025)
Urobilinogen, UA: 1 U/dL
pH, UA: 6 (ref 5.0–8.0)

## 2023-08-18 LAB — HM DIABETES FOOT EXAM

## 2023-08-18 MED ORDER — FUROSEMIDE 20 MG PO TABS: 20.0000 mg | ORAL_TABLET | Freq: Every day | ORAL | 0 refills | Status: DC

## 2023-08-18 MED ORDER — LUBIPROSTONE 8 MCG PO CAPS: 8.0000 ug | ORAL_CAPSULE | Freq: Two times a day (BID) | ORAL | 0 refills | Status: DC

## 2023-08-18 NOTE — Progress Notes (Signed)
 Patient ID: Erik Martinez, male    DOB: 10-Oct-1955, 68 y.o.   MRN: [id number]  This visit was conducted in person.  BP 112/60   Pulse 88   Temp 98 F (36.7 C) (Temporal)   Ht 5' 8 (1.727 m)   Wt 189 lb 2 oz (85.8 kg)   SpO2 99%   BMI 28.76 kg/m    CC:  Chief Complaint  Patient presents with   Medicare Wellness     HPI: Erik Martinez is a 68 y.o. male presenting on 08/18/2023 for Medicare Wellness The patient presents for annual medicare wellness, complete physical and review of chronic health problems. He/She also has the following acute concerns today:  Something is not right  1 week ago noted darker urine.. no dysuria, no frequency or urgency. In last  2 weeks he has noted increased swelling in bilateral lower legs, left leg worse than right states that his left leg where his past knee surgery was swollen twice normal earlier today but has decreased now since he has been elevating it.  No redness or pain in knee.  Some tightness and ache in bilateral calves  He feels more short of breath in last 2 weeks. Not at rest, no PND at night.  When  walks leans to side. Feeling weaker overall   I have personally reviewed the Medicare Annual Wellness questionnaire and have noted 1. The patient's medical and social history 2. Their use of alcohol, tobacco or illicit drugs 3. Their current medications and supplements 4. The patient's functional ability including ADL's, fall risks, home safety risks and hearing or visual             impairment. 5. Diet and physical activities 6. Evidence for depression or mood disorders 7.         Updated provider list  Cognitive evaluation was performed and recorded on pt medicare questionnaire form. The patients weight, height, BMI and visual acuity have been recorded in the chart   I have made referrals, counseling and provided education to the patient based review of the above and I have provided the pt with a written personalized care plan  for preventive services.   Documentation of this information was scanned into the electronic record under the media tab.   Advance directives and end of life planning reviewed in detail with patient and documented in EMR. Patient given handout on advance care directives if needed. HCPOA and living will updated if needed. Hearing Screening  Method: Audiometry   500Hz  1000Hz  2000Hz  4000Hz   Right ear 20 20 20  0  Left ear 40 20 20 0  Vision Screening - Comments:: Eye Exam with [facility] 01/24/2023  No falls in last 12 months.     07/18/2023   10:38 AM 10/27/2022    3:34 PM 10/25/2022   10:25 AM  PHQ9 SCORE ONLY  PHQ-9 Total Score 1 4 0   Diabetes:  On ozempic  2 mg weekly... has been off for 3 months.  Metformin  1000 mg BID  jardiance  25 mg daily Lab Results  Component Value Date   HGBA1C 7.4 (H) 07/18/2023  Using medications without difficulties: Hypoglycemic episodes: Hyperglycemic episodes: Feet problems: Blood Sugars averaging: eye exam within last year: Wt Readings from Last 3 Encounters:  08/18/23 189 lb 2 oz (85.8 kg)  07/18/23 179 lb 6 oz (81.4 kg)  01/24/23 189 lb (85.7 kg)   Elevated Cholesterol:  History of PAD Lab Results  Component Value Date  CHOL 118 07/18/2023   HDL 52.70 07/18/2023   LDLCALC 54 07/18/2023   TRIG 60.0 07/18/2023   CHOLHDL 2 07/18/2023  Using medications without problems: Muscle aches:  Diet compliance: Exercise: Other complaints:    On celebrex  , lidocaine  patches. Taking oxycodone  20 mg every 6hours. Lyrica  200 mg TID. Patch does seem to help, but does not have enough.     Relevant past medical, surgical, family and social history reviewed and updated as indicated. Interim medical history since our last visit reviewed. Allergies and medications reviewed and updated. Outpatient Medications Prior to Visit  Medication Sig Dispense Refill   atorvastatin  (LIPITOR) 20 MG tablet Take 20 mg by mouth at bedtime.     celecoxib   (CELEBREX ) 200 MG capsule Take 1 capsule (200 mg total) by mouth 2 (two) times daily. 30 capsule 2   Continuous Glucose Sensor (FREESTYLE LIBRE 3 PLUS SENSOR) MISC CHANCE SENSOR EVERY 15 DAYS 2 each 3   cyclobenzaprine  (FLEXERIL ) 10 MG tablet 3 (three) times daily.     empagliflozin  (JARDIANCE ) 25 MG TABS tablet Take 1 tablet (25 mg total) by mouth daily. 90 tablet 1   insulin  aspart (NOVOLOG ) 100 UNIT/ML injection Per sliding scale  MAX 15 units in 24 hour period. 10 mL PRN   lidocaine  (LIDODERM ) 5 % Place 3 patches onto the skin daily. Remove & Discard patch within 12 hours or as directed by MD 90 patch 0   metFORMIN  (GLUCOPHAGE ) 1000 MG tablet Take 1,000 mg by mouth 2 (two) times daily.     ondansetron  (ZOFRAN ) 4 MG tablet Take 1 tablet (4 mg total) by mouth every 8 (eight) hours as needed for nausea or vomiting. 20 tablet 1   Oxycodone  HCl 20 MG TABS Take 1 tablet by mouth every 6 (six) hours as needed.     pantoprazole  (PROTONIX ) 40 MG tablet Take 1 tablet (40 mg total) by mouth daily. 90 tablet 3   pregabalin  (LYRICA ) 200 MG capsule Take 1 capsule (200 mg total) by mouth in the morning, at noon, and at bedtime. 90 capsule 0   Semaglutide , 2 MG/DOSE, (OZEMPIC , 2 MG/DOSE,) 8 MG/3ML SOPN Inject 2 mg into the skin once a week.     tadalafil (CIALIS) 20 MG tablet Take 20 mg by mouth daily as needed for erectile dysfunction.     terbinafine  (LAMISIL ) 250 MG tablet Take 1 tablet (250 mg total) by mouth daily. 90 tablet 0   zolpidem  (AMBIEN ) 10 MG tablet Take 1 tablet (10 mg total) by mouth at bedtime as needed. for sleep 30 tablet 1   No facility-administered medications prior to visit.     Per HPI unless specifically indicated in ROS section below Review of Systems  Constitutional:  Negative for fatigue and fever.  HENT:  Negative for congestion, ear pain, sinus pressure, sinus pain, sneezing and sore throat.   Eyes:  Negative for pain.  Respiratory:  Positive for shortness of breath.  Negative for cough.   Cardiovascular:  Positive for leg swelling. Negative for chest pain and palpitations.  Gastrointestinal:  Positive for constipation. Negative for abdominal pain.  Genitourinary:  Negative for dysuria.  Musculoskeletal:  Negative for arthralgias.  Neurological:  Negative for syncope, light-headedness and headaches.  Psychiatric/Behavioral:  Negative for dysphoric mood.    Objective:  BP 112/60   Pulse 88   Temp 98 F (36.7 C) (Temporal)   Ht 5' 8 (1.727 m)   Wt 189 lb 2 oz (85.8 kg)   SpO2 99%  BMI 28.76 kg/m   Wt Readings from Last 3 Encounters:  08/18/23 189 lb 2 oz (85.8 kg)  07/18/23 179 lb 6 oz (81.4 kg)  01/24/23 189 lb (85.7 kg)      Physical Exam Vitals reviewed.  Constitutional:      General: He is not in acute distress.    Appearance: He is well-developed.  HENT:     Head: Normocephalic.     Right Ear: Hearing normal.     Left Ear: Hearing normal.     Nose: Nose normal.  Neck:     Thyroid : No thyroid  mass or thyromegaly.     Vascular: No carotid bruit.     Trachea: Trachea normal.  Cardiovascular:     Rate and Rhythm: Normal rate and regular rhythm.     Pulses: Normal pulses.     Heart sounds: Heart sounds not distant. No murmur heard.    No friction rub. No gallop.     Comments: No focal redness or specific area of pain, no increased warmth Pulmonary:     Effort: Pulmonary effort is normal. No respiratory distress.     Breath sounds: Normal breath sounds.  Chest:     Chest wall: No mass, lacerations, deformity, swelling or tenderness.  Abdominal:     General: Abdomen is protuberant.     Tenderness: There is no abdominal tenderness. There is no right CVA tenderness, left CVA tenderness, guarding or rebound.  Musculoskeletal:     Right lower leg: 3+ Edema present.     Left lower leg: 2+ Edema present.  Skin:    General: Skin is warm and dry.     Findings: No rash.  Neurological:     Mental Status: He is lethargic.   Psychiatric:        Speech: Speech normal.        Behavior: Behavior normal.        Thought Content: Thought content normal.   Diabetic foot exam: Normal inspection No skin breakdown No calluses  Normal DP pulses Normal sensation to light touch and monofilament Nails normal     Results for orders placed or performed in visit on 08/18/23  HM DIABETES FOOT EXAM   Collection Time: 08/18/23 12:00 AM  Result Value Ref Range   HM Diabetic Foot Exam done   POCT Urinalysis Dipstick (Automated)   Collection Time: 08/18/23  4:55 PM  Result Value Ref Range   Color, UA Yellow    Clarity, UA Clear    Glucose, UA Positive (A) Negative   Bilirubin, UA Negative    Ketones, UA Negative    Spec Grav, UA 1.010 1.010 - 1.025   Blood, UA Moderate (2+)    pH, UA 6.0 5.0 - 8.0   Protein, UA Positive (A) Negative   Urobilinogen, UA 1.0 0.2 or 1.0 E.U./dL   Nitrite, UA Negative    Leukocytes, UA Negative Negative    Assessment and Plan The patient's preventative maintenance and recommended screening tests for an annual wellness exam were reviewed in full today. Brought up to date unless services declined.  Counselled on the importance of diet, exercise, and its role in overall health and mortality. The patient's FH and SH was reviewed, including their home life, tobacco status, and drug and alcohol status.    Vaccines: Consider pneumonia vaccine given diabetes, Shingrix and tetanus Prostate Cancer Screen:Due Colon Cancer Screen:  Approximately 3 years wile in prison, no  available records.      Smoking Status:  None ETOH/ drug use: None/none  Hep C: Done  Medicare annual wellness visit, initial  Type 2 diabetes mellitus with diabetic polyneuropathy, without long-term current use of insulin  (HCC) Assessment & Plan: Chronic, inadequate control likely due to being out of Ozempic  for the last 3 months.  He will restart.  Plan reevaluation of A1c in 3 months.   Ozempic  2 mg weekly.   Metformin  1000 mg BID  jardiance  25 mg daily     Hyperlipidemia associated with type 2 diabetes mellitus (HCC) Assessment & Plan: Chronic, followed at the [facility].  Per patient within normal range on atorvastatin . Lab Results  Component Value Date   CHOL 118 07/18/2023   HDL 52.70 07/18/2023   LDLCALC 54 07/18/2023   TRIG 60.0 07/18/2023   CHOLHDL 2 07/18/2023      PAD (peripheral artery disease) (HCC) Assessment & Plan: Followed by [facility].  Stent in place  LDL goal< 70  On statin  Given recent worsening in leg ache recommended consideration of following up with vein and vascular.  Discussed that current symptoms are more consistent with possible venous issue as opposed to arterial blockage.     Diabetic peripheral neuropathy (HCC) Assessment & Plan: Chronic, stable control due to diabetes On lyrica .. works best as 200 mg three times a day.        Peripheral edema Assessment & Plan: Acute, concern for renal issue versus cardiac issue causing peripheral swelling.  Will reevaluate kidney function.  See discussion of cardiac and shortness of breath section.  He does likely have some chronic venous insufficiency, likely worse on the left versus right given past knee surgery and complications. There is no current erythema or warmth at knee making joint infection unlikely.  Start Lasix  20 mg p.o. daily, elevate feet above heart.  Encouraged patient to start wearing 15 to 20 mmHg compression bilateral lower legs when he is up on his feet.   SOB (shortness of breath) Assessment & Plan: Acute, new symptoms EKG showed concern for left ventricular hypertrophy and nonspecific T wave changes Recommend check of CBC, BNP and patient referred for stat echocardiogram.  Will treat with Lasix  20 mg daily for 5 days to help with fluid overload. Return  and ER precautions provided  Orders: -     EKG 12-Lead -     Brain natriuretic peptide; Future -     CBC  with Differential/Platelet; Future -     ECHOCARDIOGRAM COMPLETE; Future  Colon cancer screening  Abnormal urine -     POCT Urinalysis Dipstick (Automated) -     Urine Culture  Prostate cancer screening -     PSA, Medicare; Future  Hematuria, unspecified type Assessment & Plan: New, denies symptoms of urinary tract infection.  Will send urine for culture. Recent kidney function test within the normal limits.  Will repeat check with labs next week. Patient denies history of any signs and symptoms of nephrolithiasis.  May need referral to urology for workup.   Orders: -     Basic metabolic panel with GFR; Future  Chronic constipation Assessment & Plan: Chronic, likely due to chronic narcotic use. He has tried and failed multiple over-the-counter treatments including MiraLAX higher dose, Dulcolax, fiber, water . Will prescribe generic Amitiza  if covered by insurance.   Other orders -     Lubiprostone ; Take 1 capsule (8 mcg total) by mouth 2 (two) times daily with a meal. HAS failed mulitple OTC treatment for constipaiton  Dispense: 60 capsule;  Refill: 0 -     Furosemide ; Take 1 tablet (20 mg total) by mouth daily.  Dispense: 5 tablet; Refill: 0     Return in about 3 days (around 08/21/2023) for  lab only.   Greig Ring, MD

## 2023-08-18 NOTE — Assessment & Plan Note (Signed)
 Acute, new symptoms EKG showed concern for left ventricular hypertrophy and nonspecific T wave changes Recommend check of CBC, BNP and patient referred for stat echocardiogram.  Will treat with Lasix  20 mg daily for 5 days to help with fluid overload. Return  and ER precautions provided

## 2023-08-18 NOTE — Assessment & Plan Note (Signed)
Chronic, stable control due to diabetes On lyrica.Marland Kitchen works best as 200 mg three times a day.

## 2023-08-18 NOTE — Assessment & Plan Note (Addendum)
 Followed by [facility].  Stent in place  LDL goal< 70  On statin  Given recent worsening in leg ache recommended consideration of following up with vein and vascular.  Discussed that current symptoms are more consistent with possible venous issue as opposed to arterial blockage.

## 2023-08-18 NOTE — Patient Instructions (Signed)
 Call to set up lab only visit early next week.  Working setting up ultrasound.  Start Lasix  once daily in the mornings to help with fluid overload.  Elevate your legs above your heart.  Start wearing compression hose when you are up on your feet at work.  Go to the emergency room if you have severe shortness of breath or chest pain.

## 2023-08-18 NOTE — Assessment & Plan Note (Signed)
 New, denies symptoms of urinary tract infection.  Will send urine for culture. Recent kidney function test within the normal limits.  Will repeat check with labs next week. Patient denies history of any signs and symptoms of nephrolithiasis.  May need referral to urology for workup.

## 2023-08-18 NOTE — Assessment & Plan Note (Addendum)
 Chronic, inadequate control likely due to being out of Ozempic  for the last 3 months.  He will restart.  Plan reevaluation of A1c in 3 months.   Ozempic  2 mg weekly.  Metformin  1000 mg BID  jardiance  25 mg daily  Insulin  Novolog   per Sliding scale

## 2023-08-18 NOTE — Assessment & Plan Note (Signed)
 Chronic, followed at the [facility].  Per patient within normal range on atorvastatin . Lab Results  Component Value Date   CHOL 118 07/18/2023   HDL 52.70 07/18/2023   LDLCALC 54 07/18/2023   TRIG 60.0 07/18/2023   CHOLHDL 2 07/18/2023

## 2023-08-18 NOTE — Assessment & Plan Note (Signed)
 Chronic, likely due to chronic narcotic use. He has tried and failed multiple over-the-counter treatments including MiraLAX higher dose, Dulcolax, fiber, water . Will prescribe generic Amitiza  if covered by insurance.

## 2023-08-18 NOTE — Assessment & Plan Note (Signed)
 Acute, concern for renal issue versus cardiac issue causing peripheral swelling.  Will reevaluate kidney function.  See discussion of cardiac and shortness of breath section.  He does likely have some chronic venous insufficiency, likely worse on the left versus right given past knee surgery and complications. There is no current erythema or warmth at knee making joint infection unlikely.  Start Lasix  20 mg p.o. daily, elevate feet above heart.  Encouraged patient to start wearing 15 to 20 mmHg compression bilateral lower legs when he is up on his feet.

## 2023-08-19 ENCOUNTER — Other Ambulatory Visit: Payer: Self-pay | Admitting: Family Medicine

## 2023-08-19 LAB — URINE CULTURE
MICRO NUMBER:: 16806447
Result:: NO GROWTH
SPECIMEN QUALITY:: ADEQUATE

## 2023-08-21 ENCOUNTER — Telehealth: Payer: Self-pay | Admitting: *Deleted

## 2023-08-21 ENCOUNTER — Other Ambulatory Visit (INDEPENDENT_AMBULATORY_CARE_PROVIDER_SITE_OTHER)

## 2023-08-21 ENCOUNTER — Ambulatory Visit: Payer: Self-pay | Admitting: Family Medicine

## 2023-08-21 ENCOUNTER — Encounter: Payer: Self-pay | Admitting: *Deleted

## 2023-08-21 DIAGNOSIS — R0602 Shortness of breath: Secondary | ICD-10-CM

## 2023-08-21 DIAGNOSIS — Z125 Encounter for screening for malignant neoplasm of prostate: Secondary | ICD-10-CM | POA: Diagnosis not present

## 2023-08-21 DIAGNOSIS — R319 Hematuria, unspecified: Secondary | ICD-10-CM | POA: Diagnosis not present

## 2023-08-21 LAB — CBC WITH DIFFERENTIAL/PLATELET
Basophils Absolute: 0 K/uL (ref 0.0–0.1)
Basophils Relative: 0.4 % (ref 0.0–3.0)
Eosinophils Absolute: 0.1 K/uL (ref 0.0–0.7)
Eosinophils Relative: 3.1 % (ref 0.0–5.0)
HCT: 23.6 % — CL (ref 39.0–52.0)
Hemoglobin: 7.3 g/dL — CL (ref 13.0–17.0)
Lymphocytes Relative: 9.1 % — ABNORMAL LOW (ref 12.0–46.0)
Lymphs Abs: 0.4 K/uL — ABNORMAL LOW (ref 0.7–4.0)
MCHC: 30.8 g/dL (ref 30.0–36.0)
MCV: 64.1 fl — ABNORMAL LOW (ref 78.0–100.0)
Monocytes Absolute: 0.4 K/uL (ref 0.1–1.0)
Monocytes Relative: 8.2 % (ref 3.0–12.0)
Neutro Abs: 3.5 K/uL (ref 1.4–7.7)
Neutrophils Relative %: 79.2 % — ABNORMAL HIGH (ref 43.0–77.0)
Platelets: 189 K/uL (ref 150.0–400.0)
RBC: 3.67 Mil/uL — ABNORMAL LOW (ref 4.22–5.81)
RDW: 21.7 % — ABNORMAL HIGH (ref 11.5–15.5)
WBC: 4.5 K/uL (ref 4.0–10.5)

## 2023-08-21 LAB — BASIC METABOLIC PANEL WITH GFR
BUN: 18 mg/dL (ref 6–23)
CO2: 24 meq/L (ref 19–32)
Calcium: 7.5 mg/dL — ABNORMAL LOW (ref 8.4–10.5)
Chloride: 100 meq/L (ref 96–112)
Creatinine, Ser: 1.01 mg/dL (ref 0.40–1.50)
GFR: 77.37 mL/min (ref >60.00)
Glucose, Bld: 231 mg/dL — ABNORMAL HIGH (ref 70–99)
Potassium: 3.7 meq/L (ref 3.5–5.1)
Sodium: 133 meq/L — ABNORMAL LOW (ref 135–145)

## 2023-08-21 LAB — BRAIN NATRIURETIC PEPTIDE: Pro B Natriuretic peptide (BNP): 1787 pg/mL — ABNORMAL HIGH (ref 0.0–100.0)

## 2023-08-21 LAB — PSA, MEDICARE: PSA: 0.18 ng/mL (ref 0.10–4.00)

## 2023-08-21 NOTE — Telephone Encounter (Signed)
 Once again I have tried to reach patient via telephone.  I have left him another message to go to ER due to his labs show he is in acute heart failure.

## 2023-08-21 NOTE — Telephone Encounter (Signed)
 I called and LMOM again directing him to the nearest ER.

## 2023-08-21 NOTE — Telephone Encounter (Signed)
 Last office visit 08/18/23 for MWV.  Last refilled 06/17/23 for #30 with 2 refills. Pharmacy requesting next refill.  Next appt: No future appointments.

## 2023-08-21 NOTE — Telephone Encounter (Addendum)
 Called Mr. Montelongo.  Call went to voicemail.  Left message and sent MyChart message that his labs show he is in acute heart failure and needs to go to the ER ASAP per Dr. Watt.  I have also left message with Mr. Lake who is listed as an emergency contact.

## 2023-08-21 NOTE — Telephone Encounter (Signed)
 I also called him on the phone just now and left an additional message.  He does have a hemoglobin of 7 and a BNP greater than 1700.  Urged him to go to the ER at his nearest available emergency room.

## 2023-08-21 NOTE — Telephone Encounter (Signed)
 Darice with  lab called with critical labs on pt.  Pt has a Hemoglobin of 7.3 And a Hematocrit of 23.6  Results given to Dr. Watt since PCP isn't here, note will also be routed to PCP

## 2023-08-21 NOTE — Telephone Encounter (Signed)
 Tried calling [name] again.  It went straight to voicemail  If patient calls back please instructed him to go to ER ASAP.

## 2023-08-21 NOTE — Telephone Encounter (Signed)
 Copied from CRM #[id number]. Topic: General - Other >> Aug 21, 2023  1:49 PM Gennette ORN wrote: Reason for CRM: Patient is returning Arland call. Please call patient back.

## 2023-08-22 ENCOUNTER — Emergency Department

## 2023-08-22 ENCOUNTER — Inpatient Hospital Stay
Admission: EM | Admit: 2023-08-22 | Discharge: 2023-08-25 | DRG: 292 | Disposition: A | Discharge status: Home / Self Care | Attending: Internal Medicine | Admitting: Internal Medicine

## 2023-08-22 ENCOUNTER — Other Ambulatory Visit: Payer: Self-pay

## 2023-08-22 DIAGNOSIS — M25562 Pain in left knee: Secondary | ICD-10-CM | POA: Diagnosis not present

## 2023-08-22 DIAGNOSIS — K219 Gastro-esophageal reflux disease without esophagitis: Secondary | ICD-10-CM | POA: Diagnosis present

## 2023-08-22 DIAGNOSIS — C184 Malignant neoplasm of transverse colon: Secondary | ICD-10-CM | POA: Diagnosis present

## 2023-08-22 DIAGNOSIS — N319 Neuromuscular dysfunction of bladder, unspecified: Secondary | ICD-10-CM | POA: Diagnosis present

## 2023-08-22 DIAGNOSIS — Z7984 Long term (current) use of oral hypoglycemic drugs: Secondary | ICD-10-CM | POA: Diagnosis not present

## 2023-08-22 DIAGNOSIS — D509 Iron deficiency anemia, unspecified: Secondary | ICD-10-CM | POA: Diagnosis present

## 2023-08-22 DIAGNOSIS — E1169 Type 2 diabetes mellitus with other specified complication: Secondary | ICD-10-CM | POA: Diagnosis present

## 2023-08-22 DIAGNOSIS — C19 Malignant neoplasm of rectosigmoid junction: Secondary | ICD-10-CM | POA: Diagnosis not present

## 2023-08-22 DIAGNOSIS — F1120 Opioid dependence, uncomplicated: Secondary | ICD-10-CM | POA: Diagnosis not present

## 2023-08-22 DIAGNOSIS — N529 Male erectile dysfunction, unspecified: Secondary | ICD-10-CM | POA: Diagnosis present

## 2023-08-22 DIAGNOSIS — R7989 Other specified abnormal findings of blood chemistry: Secondary | ICD-10-CM | POA: Diagnosis not present

## 2023-08-22 DIAGNOSIS — K6389 Other specified diseases of intestine: Secondary | ICD-10-CM | POA: Diagnosis not present

## 2023-08-22 DIAGNOSIS — R0609 Other forms of dyspnea: Secondary | ICD-10-CM | POA: Diagnosis not present

## 2023-08-22 DIAGNOSIS — G894 Chronic pain syndrome: Secondary | ICD-10-CM | POA: Diagnosis not present

## 2023-08-22 DIAGNOSIS — Z79899 Other long term (current) drug therapy: Secondary | ICD-10-CM

## 2023-08-22 DIAGNOSIS — E663 Overweight: Secondary | ICD-10-CM | POA: Diagnosis not present

## 2023-08-22 DIAGNOSIS — Z96642 Presence of left artificial hip joint: Secondary | ICD-10-CM | POA: Diagnosis present

## 2023-08-22 DIAGNOSIS — Z6826 Body mass index (BMI) 26.0-26.9, adult: Secondary | ICD-10-CM

## 2023-08-22 DIAGNOSIS — E1151 Type 2 diabetes mellitus with diabetic peripheral angiopathy without gangrene: Secondary | ICD-10-CM | POA: Diagnosis present

## 2023-08-22 DIAGNOSIS — K5669 Other partial intestinal obstruction: Secondary | ICD-10-CM | POA: Diagnosis not present

## 2023-08-22 DIAGNOSIS — E1142 Type 2 diabetes mellitus with diabetic polyneuropathy: Secondary | ICD-10-CM | POA: Diagnosis not present

## 2023-08-22 DIAGNOSIS — M879 Osteonecrosis, unspecified: Secondary | ICD-10-CM | POA: Diagnosis not present

## 2023-08-22 DIAGNOSIS — F418 Other specified anxiety disorders: Secondary | ICD-10-CM | POA: Diagnosis not present

## 2023-08-22 DIAGNOSIS — F319 Bipolar disorder, unspecified: Secondary | ICD-10-CM | POA: Diagnosis present

## 2023-08-22 DIAGNOSIS — Z96652 Presence of left artificial knee joint: Secondary | ICD-10-CM | POA: Diagnosis present

## 2023-08-22 DIAGNOSIS — I509 Heart failure, unspecified: Secondary | ICD-10-CM

## 2023-08-22 DIAGNOSIS — M1990 Unspecified osteoarthritis, unspecified site: Secondary | ICD-10-CM | POA: Diagnosis present

## 2023-08-22 DIAGNOSIS — Z8619 Personal history of other infectious and parasitic diseases: Secondary | ICD-10-CM | POA: Diagnosis not present

## 2023-08-22 DIAGNOSIS — E1165 Type 2 diabetes mellitus with hyperglycemia: Secondary | ICD-10-CM | POA: Diagnosis not present

## 2023-08-22 DIAGNOSIS — Z833 Family history of diabetes mellitus: Secondary | ICD-10-CM | POA: Diagnosis not present

## 2023-08-22 DIAGNOSIS — R319 Hematuria, unspecified: Secondary | ICD-10-CM | POA: Diagnosis present

## 2023-08-22 DIAGNOSIS — R600 Localized edema: Secondary | ICD-10-CM | POA: Diagnosis not present

## 2023-08-22 DIAGNOSIS — Z794 Long term (current) use of insulin: Secondary | ICD-10-CM | POA: Diagnosis not present

## 2023-08-22 DIAGNOSIS — Z885 Allergy status to narcotic agent status: Secondary | ICD-10-CM | POA: Diagnosis not present

## 2023-08-22 DIAGNOSIS — E876 Hypokalemia: Secondary | ICD-10-CM | POA: Diagnosis present

## 2023-08-22 DIAGNOSIS — R0600 Dyspnea, unspecified: Secondary | ICD-10-CM | POA: Diagnosis not present

## 2023-08-22 DIAGNOSIS — Z9109 Other allergy status, other than to drugs and biological substances: Secondary | ICD-10-CM

## 2023-08-22 DIAGNOSIS — Z5941 Food insecurity: Secondary | ICD-10-CM

## 2023-08-22 DIAGNOSIS — J811 Chronic pulmonary edema: Secondary | ICD-10-CM | POA: Diagnosis not present

## 2023-08-22 DIAGNOSIS — Z791 Long term (current) use of non-steroidal anti-inflammatories (NSAID): Secondary | ICD-10-CM

## 2023-08-22 DIAGNOSIS — I5031 Acute diastolic (congestive) heart failure: Secondary | ICD-10-CM | POA: Diagnosis not present

## 2023-08-22 DIAGNOSIS — F419 Anxiety disorder, unspecified: Secondary | ICD-10-CM | POA: Diagnosis present

## 2023-08-22 DIAGNOSIS — D649 Anemia, unspecified: Secondary | ICD-10-CM | POA: Diagnosis not present

## 2023-08-22 DIAGNOSIS — R0602 Shortness of breath: Secondary | ICD-10-CM | POA: Diagnosis not present

## 2023-08-22 DIAGNOSIS — E785 Hyperlipidemia, unspecified: Secondary | ICD-10-CM | POA: Diagnosis present

## 2023-08-22 DIAGNOSIS — Z7985 Long-term (current) use of injectable non-insulin antidiabetic drugs: Secondary | ICD-10-CM

## 2023-08-22 DIAGNOSIS — Z83438 Family history of other disorder of lipoprotein metabolism and other lipidemia: Secondary | ICD-10-CM

## 2023-08-22 DIAGNOSIS — Z8261 Family history of arthritis: Secondary | ICD-10-CM

## 2023-08-22 DIAGNOSIS — J90 Pleural effusion, not elsewhere classified: Secondary | ICD-10-CM | POA: Diagnosis not present

## 2023-08-22 DIAGNOSIS — M7989 Other specified soft tissue disorders: Secondary | ICD-10-CM | POA: Diagnosis not present

## 2023-08-22 LAB — PROTIME-INR
INR: 1.2 (ref 0.8–1.2)
Prothrombin Time: 16.4 s — ABNORMAL HIGH (ref 11.4–15.2)

## 2023-08-22 LAB — VITAMIN B12: Vitamin B-12: 1105 pg/mL — ABNORMAL HIGH (ref 180–914)

## 2023-08-22 LAB — CBC
HCT: 25.8 % — ABNORMAL LOW (ref 39.0–52.0)
Hemoglobin: 7.8 g/dL — ABNORMAL LOW (ref 13.0–17.0)
MCH: 20.2 pg — ABNORMAL LOW (ref 26.0–34.0)
MCHC: 30.2 g/dL (ref 30.0–36.0)
MCV: 66.7 fL — ABNORMAL LOW (ref 80.0–100.0)
Platelets: 226 K/uL (ref 150–400)
RBC: 3.87 MIL/uL — ABNORMAL LOW (ref 4.22–5.81)
RDW: 21.3 % — ABNORMAL HIGH (ref 11.5–15.5)
WBC: 5.1 K/uL (ref 4.0–10.5)
nRBC: 0 % (ref 0.0–0.2)

## 2023-08-22 LAB — TYPE AND SCREEN
ABO/RH(D): A POS
Antibody Screen: NEGATIVE

## 2023-08-22 LAB — HEPATIC FUNCTION PANEL
ALT: 485 U/L — ABNORMAL HIGH (ref 0–44)
AST: 108 U/L — ABNORMAL HIGH (ref 15–41)
Albumin: 2.7 g/dL — ABNORMAL LOW (ref 3.5–5.0)
Alkaline Phosphatase: 95 U/L (ref 38–126)
Bilirubin, Direct: 0.3 mg/dL — ABNORMAL HIGH (ref 0.0–0.2)
Indirect Bilirubin: 0.7 mg/dL (ref 0.3–0.9)
Total Bilirubin: 1 mg/dL (ref 0.0–1.2)
Total Protein: 6.4 g/dL — ABNORMAL LOW (ref 6.5–8.1)

## 2023-08-22 LAB — BASIC METABOLIC PANEL WITH GFR
Anion gap: 11 (ref 5–15)
BUN: 14 mg/dL (ref 8–23)
CO2: 24 mmol/L (ref 22–32)
Calcium: 8 mg/dL — ABNORMAL LOW (ref 8.9–10.3)
Chloride: 99 mmol/L (ref 98–111)
Creatinine, Ser: 0.94 mg/dL (ref 0.61–1.24)
GFR, Estimated: >60 mL/min (ref >60)
Glucose, Bld: 135 mg/dL — ABNORMAL HIGH (ref 70–99)
Potassium: 3.3 mmol/L — ABNORMAL LOW (ref 3.5–5.1)
Sodium: 134 mmol/L — ABNORMAL LOW (ref 135–145)

## 2023-08-22 LAB — TROPONIN I (HIGH SENSITIVITY): Troponin I (High Sensitivity): 108 ng/L (ref <18)

## 2023-08-22 LAB — IRON AND TIBC
Iron: 8 ug/dL — ABNORMAL LOW (ref 45–182)
Saturation Ratios: 2 % — ABNORMAL LOW (ref 17.9–39.5)
TIBC: 360 ug/dL (ref 250–450)
UIBC: 352 ug/dL

## 2023-08-22 LAB — BRAIN NATRIURETIC PEPTIDE: B Natriuretic Peptide: 1782.6 pg/mL — ABNORMAL HIGH (ref 0.0–100.0)

## 2023-08-22 LAB — MAGNESIUM: Magnesium: 2 mg/dL (ref 1.7–2.4)

## 2023-08-22 LAB — GLUCOSE, CAPILLARY: Glucose-Capillary: 128 mg/dL — ABNORMAL HIGH (ref 70–99)

## 2023-08-22 LAB — D-DIMER, QUANTITATIVE: D-Dimer, Quant: 2.66 ug{FEU}/mL — ABNORMAL HIGH (ref 0.00–0.50)

## 2023-08-22 LAB — LIPASE, BLOOD: Lipase: 45 U/L (ref 11–51)

## 2023-08-22 MED ORDER — POLYETHYLENE GLYCOL 3350 17 G PO PACK
17.0000 g | PACK | Freq: Every day | ORAL | Status: DC | PRN
Start: 2023-08-22 — End: 2023-08-25

## 2023-08-22 MED ORDER — CYCLOBENZAPRINE HCL 10 MG PO TABS
10.0000 mg | ORAL_TABLET | Freq: Three times a day (TID) | ORAL | Status: DC
  Administered 2023-08-22 – 2023-08-25 (×13): 10 mg via ORAL
  Filled 2023-08-22 (×9): qty 1

## 2023-08-22 MED ORDER — POTASSIUM CHLORIDE CRYS ER 20 MEQ PO TBCR
20.0000 meq | EXTENDED_RELEASE_TABLET | Freq: Every day | ORAL | Status: DC
  Administered 2023-08-22 – 2023-08-23 (×4): 20 meq via ORAL
  Filled 2023-08-22 (×2): qty 1

## 2023-08-22 MED ORDER — FUROSEMIDE 10 MG/ML IJ SOLN
40.0000 mg | Freq: Two times a day (BID) | INTRAMUSCULAR | Status: DC
  Administered 2023-08-22 – 2023-08-23 (×6): 40 mg via INTRAVENOUS
  Filled 2023-08-22 (×3): qty 4

## 2023-08-22 MED ORDER — PANTOPRAZOLE SODIUM 40 MG IV SOLR
40.0000 mg | Freq: Once | INTRAVENOUS | Status: AC
  Administered 2023-08-22 (×2): 40 mg via INTRAVENOUS
  Filled 2023-08-22: qty 10

## 2023-08-22 MED ORDER — INSULIN ASPART 100 UNIT/ML IJ SOLN: 0.0000 [IU] | Freq: Every day | INTRAMUSCULAR | Status: DC

## 2023-08-22 MED ORDER — ACETAMINOPHEN 325 MG PO TABS: 650.0000 mg | ORAL_TABLET | Freq: Four times a day (QID) | ORAL | Status: DC | PRN

## 2023-08-22 MED ORDER — INSULIN ASPART 100 UNIT/ML IJ SOLN
0.0000 [IU] | Freq: Three times a day (TID) | INTRAMUSCULAR | Status: DC
  Administered 2023-08-23: 1 [IU] via SUBCUTANEOUS
  Administered 2023-08-23: 2 [IU] via SUBCUTANEOUS
  Administered 2023-08-23: 1 [IU] via SUBCUTANEOUS
  Administered 2023-08-23: 3 [IU] via SUBCUTANEOUS
  Administered 2023-08-23: 2 [IU] via SUBCUTANEOUS
  Administered 2023-08-23: 3 [IU] via SUBCUTANEOUS
  Administered 2023-08-24: 5 [IU] via SUBCUTANEOUS
  Administered 2023-08-24: 3 [IU] via SUBCUTANEOUS
  Administered 2023-08-24: 1 [IU] via SUBCUTANEOUS
  Administered 2023-08-25: 5 [IU] via SUBCUTANEOUS
  Filled 2023-08-22 (×7): qty 1

## 2023-08-22 MED ORDER — EMPAGLIFLOZIN 25 MG PO TABS
25.0000 mg | ORAL_TABLET | Freq: Every day | ORAL | Status: DC
  Administered 2023-08-23 – 2023-08-25 (×4): 25 mg via ORAL
  Filled 2023-08-22 (×3): qty 1

## 2023-08-22 MED ORDER — PREGABALIN 75 MG PO CAPS
200.0000 mg | ORAL_CAPSULE | Freq: Three times a day (TID) | ORAL | Status: DC
  Administered 2023-08-22 – 2023-08-25 (×13): 200 mg via ORAL
  Filled 2023-08-22 (×9): qty 1

## 2023-08-22 MED ORDER — ACETAMINOPHEN 650 MG RE SUPP: 650.0000 mg | Freq: Four times a day (QID) | RECTAL | Status: DC | PRN

## 2023-08-22 MED ORDER — ATORVASTATIN CALCIUM 20 MG PO TABS
20.0000 mg | ORAL_TABLET | Freq: Every day | ORAL | Status: DC
  Administered 2023-08-22 – 2023-08-24 (×5): 20 mg via ORAL
  Filled 2023-08-22 (×3): qty 1

## 2023-08-22 MED ORDER — OXYCODONE HCL 5 MG PO TABS
20.0000 mg | ORAL_TABLET | Freq: Once | ORAL | Status: AC
  Administered 2023-08-22 (×2): 20 mg via ORAL
  Filled 2023-08-22: qty 4

## 2023-08-22 MED ORDER — POLYSACCHARIDE IRON COMPLEX 150 MG PO CAPS
150.0000 mg | ORAL_CAPSULE | Freq: Every day | ORAL | Status: DC
  Administered 2023-08-22 – 2023-08-25 (×6): 150 mg via ORAL
  Filled 2023-08-22 (×4): qty 1

## 2023-08-22 MED ORDER — OXYCODONE HCL 5 MG PO TABS
20.0000 mg | ORAL_TABLET | Freq: Four times a day (QID) | ORAL | Status: DC | PRN
  Administered 2023-08-22 – 2023-08-25 (×14): 20 mg via ORAL
  Filled 2023-08-22 (×10): qty 4

## 2023-08-22 MED ORDER — ZOLPIDEM TARTRATE 5 MG PO TABS
10.0000 mg | ORAL_TABLET | Freq: Every evening | ORAL | Status: DC | PRN
  Administered 2023-08-22 – 2023-08-24 (×5): 10 mg via ORAL
  Filled 2023-08-22 (×3): qty 2

## 2023-08-22 NOTE — Telephone Encounter (Addendum)
 Spoke with Tilton who is listed as one of Mr. Deloria emergency contacts and ask her that if she can get in contact with Mr. Pfluger to let him know he needs to go to the ER ASAP due to his labs are showing he is in Acute Heart Failure.  She states she will go check on Mr. Cantara right now.   I also left another message with Mr. Bettie who is also listed as an emergency contact.

## 2023-08-22 NOTE — ED Provider Notes (Signed)
 [facility] Provider Note    Event Date/Time   First MD Initiated Contact with Patient 08/22/23 1229     (approximate)   History   Abnormal Lab and dyspnea with exertion   HPI  Erik Martinez is a 68 y.o. male patient with a past medical history significant for chronic pain, diabetes, presents to the emergency department with multiple lab work abnormalities from primary care physician.  Patient was evaluated yesterday at primary care physician and had lab work done, called today and told that he had critical lab work values and he needed to come to the emergency department.  Patient found to have significant anemia with a hemoglobin of 7.3 from a baseline of 10.7.  Does endorse some blood in his urine.  Not on any anticoagulation or antiplatelet agents.  States that he has been having shortness of breath and exertional dyspnea over the past 5 to 6 days which prompted the visit to his primary care provider.  Significant swelling to both of his legs and he feels like it is worse in his left leg.  No history of heart failure.  Denies any active chest pain at this time.  States that he gets very short of breath with minimal activity at work.  Denies any alcohol use.  Denies any history of cirrhosis.  States that he had a history of hepatitis C but that was treated.  Denies any significant NSAID use.     Physical Exam   Triage Vital Signs: ED Triage Vitals  Encounter Vitals Group     BP 08/22/23 1048 (!) 145/74     Girls Systolic BP Percentile --      Girls Diastolic BP Percentile --      Boys Systolic BP Percentile --      Boys Diastolic BP Percentile --      Pulse Rate 08/22/23 1048 97     Resp 08/22/23 1048 20     Temp 08/22/23 1048 98.8 F (37.1 C)     Temp Source 08/22/23 1048 Oral     SpO2 08/22/23 1048 100 %     Weight 08/22/23 1051 180 lb (81.6 kg)     Height 08/22/23 1051 5' 9 (1.753 m)     Head Circumference --      Peak Flow --      Pain Score  08/22/23 1048 0     Pain Loc --      Pain Education --      Exclude from Growth Chart --     Most recent vital signs: Vitals:   08/22/23 1048 08/22/23 1430  BP: (!) 145/74 138/70  Pulse: 97 91  Resp: 20 14  Temp: 98.8 F (37.1 C) 98.5 F (36.9 C)  SpO2: 100% 100%    Physical Exam Exam conducted with a chaperone present.  Constitutional:      Appearance: He is well-developed.  HENT:     Head: Atraumatic.     Mouth/Throat:     Mouth: Mucous membranes are moist.  Eyes:     Conjunctiva/sclera: Conjunctivae normal.  Cardiovascular:     Rate and Rhythm: Regular rhythm.  Pulmonary:     Effort: No respiratory distress.     Breath sounds: Rales present.     Comments: Crackles bilateral lower lung fields Abdominal:     Tenderness: There is no abdominal tenderness.     Comments: Distended abdomen that is nontender to palpation  Genitourinary:    Comments: No  gross blood or melena on DRE Musculoskeletal:     Cervical back: Normal range of motion.     Right lower leg: Edema present.     Left lower leg: Edema present.     Comments: 2+ pitting edema to bilateral lower extremities  Skin:    General: Skin is warm.  Neurological:     Mental Status: He is alert. Mental status is at baseline.     IMPRESSION / MDM / ASSESSMENT AND PLAN / ED COURSE  I reviewed the triage vital signs and the nursing notes.  Differential diagnosis including GI bleed, microcytic anemia, malignancy, new onset heart failure, progression to cirrhosis  Will give IV Protonix  although I do not see any obvious symptoms of a GI bleed and no gross blood or melena on my exam  Given his home oxycodone  at patient request  EKG  I, Clotilda Punter, the attending physician, personally viewed and interpreted this ECG.   Rate: Normal  Rhythm: Normal sinus  Axis: Normal  Intervals: Normal  ST&T Change: None  No tachycardic or bradycardic dysrhythmias while on cardiac telemetry.  RADIOLOGY I independently  reviewed imaging, my interpretation of imaging: Chest x-ray with concerns for bilateral pleural effusions.  Read as concerning for pulmonary edema  Ultrasound DVT pending  LABS (all labs ordered are listed, but only abnormal results are displayed) Labs interpreted as -    Labs Reviewed  BASIC METABOLIC PANEL WITH GFR - Abnormal; Notable for the following components:      Result Value   Sodium 134 (*)    Potassium 3.3 (*)    Glucose, Bld 135 (*)    Calcium  8.0 (*)    All other components within normal limits  CBC - Abnormal; Notable for the following components:   RBC 3.87 (*)    Hemoglobin 7.8 (*)    HCT 25.8 (*)    MCV 66.7 (*)    MCH 20.2 (*)    RDW 21.3 (*)    All other components within normal limits  BRAIN NATRIURETIC PEPTIDE - Abnormal; Notable for the following components:   B Natriuretic Peptide 1,782.6 (*)    All other components within normal limits  PROTIME-INR - Abnormal; Notable for the following components:   Prothrombin Time 16.4 (*)    All other components within normal limits  HEPATIC FUNCTION PANEL - Abnormal; Notable for the following components:   Total Protein 6.4 (*)    Albumin 2.7 (*)    AST 108 (*)    ALT 485 (*)    Bilirubin, Direct 0.3 (*)    All other components within normal limits  LIPASE, BLOOD  URINALYSIS, ROUTINE W REFLEX MICROSCOPIC  D-DIMER, QUANTITATIVE  MAGNESIUM   IRON  AND TIBC  VITAMIN B12  TYPE AND SCREEN  TROPONIN I (HIGH SENSITIVITY)     MDM  No leukocytosis, ongoing anemia, normal platelets.  Normal anticoagulation.  No obvious gross blood or melena.  Elevated LFTs and low albumin, normal INR.  Patient with significantly elevated BNP.  Concern for possible new onset heart failure versus progression to cirrhosis.  Consulted hospitalist for admission for symptomatic anemia and concern for new onset heart failure.  They will follow-up D-dimer and DVT ultrasound.  They also will follow-up troponin.      PROCEDURES:  Critical Care performed: No  Procedures  Patient's presentation is most consistent with acute presentation with potential threat to life or bodily function.   MEDICATIONS ORDERED IN ED: Medications  furosemide  (LASIX ) injection 40 mg (  has no administration in time range)  iron  polysaccharides (NIFEREX) capsule 150 mg (has no administration in time range)  potassium chloride  SA (KLOR-CON  M) CR tablet 20 mEq (has no administration in time range)  pantoprazole  (PROTONIX ) injection 40 mg (40 mg Intravenous Given 08/22/23 1524)  oxyCODONE  (Oxy IR/ROXICODONE ) immediate release tablet 20 mg (20 mg Oral Given 08/22/23 1524)    FINAL CLINICAL IMPRESSION(S) / ED DIAGNOSES   Final diagnoses:  Symptomatic anemia  Dyspnea, unspecified type     Rx / DC Orders   ED Discharge Orders     None        Note:  This document was prepared using Dragon voice recognition software and may include unintentional dictation errors.   Suzanne Kirsch, MD 08/22/23 1600

## 2023-08-22 NOTE — H&P (Signed)
 History and Physical    Patient: Erik Martinez FMW:[id number] DOB: 1955/09/19 DOA: 08/22/2023 DOS: the patient was seen and examined on 08/22/2023 PCP: [name], MD  Patient coming from: Home  Chief Complaint:  Chief Complaint  Patient presents with   Abnormal Lab   dyspnea with exertion   HPI: Erik Martinez is a 68 y.o. 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  Patient comes into the emergency room referred by PCP after he started noticing increasing shortness of breath and bilateral lower extremity edema for one week. He was seen at PCPs office started on oral Lasix  for leg swelling. Bloodwork showed significant anemia of hemoglobin 7.3 with iron  deficiency and elevated BNP. He was asked to come to the emergency room.  Patient remains hemodynamically stable sats hundred percent on room air. He does have significant bilateral lower extremity edema and found to have hemoglobin of 7.8 with MCV 66. Denies any G.I. bleed in the form of plaque or maroon stools. He tells me his urine is orange color. Denies any blood clots in urine. Does have a history of acid reflux and takes Protonix . He also takes Celebrex  force chronic pain along with oxycodone   patient was found with hemoglobin of 7.8 MCV 66. Chest x-ray shows bilateral pulmonary vascular congestion. Elevated BNP of 1700. Patient is being admitted with acute CHF new onset in the setting of severe iron  deficiency anemia.     Review of Systems: As mentioned in the history of present illness. All other systems reviewed and are negative. Past Medical History:  Diagnosis Date   Anemia    Anxiety    Arthritis    Avascular necrosis of bones of both hips (HCC)    Bipolar disorder (HCC)    Bladder spasms    Chronic  pain of left knee    Chronic pain syndrome    a.) followed by pain clinic   Cocaine use disorder, moderate, dependence (HCC) 09/09/2015   DDD (degenerative disc disease)    Depression    Erectile dysfunction    a.) on PDE5i (sildenafil)   Frequency-urgency syndrome    GERD (gastroesophageal reflux disease)    Hepatitis C 2018   History of hepatitis B    1983  TX'D--  NO ISSUES OR SYMPTOMS SINCE   Hyperlipidemia    Hypotestosteronemia    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   Neurogenic bladder    Nocturia    PAD (peripheral artery disease) (HCC)    Polysubstance abuse (HCC)    a.) cocaine + marijuana + BZO + opioids   PONV (postoperative nausea and vomiting)    difficult to put me to sleep   Spinal headache    T2DM (type 2 diabetes mellitus) (HCC)    Urine incontinence    Past Surgical History:  Procedure Laterality Date   APPLICATION OF WOUND VAC Left 09/11/2021   Procedure: APPLICATION OF WOUND VAC;  Surgeon: [name], MD;  Location: [facility];  Service: Orthopedics;  Laterality: Left;  [id number]    BACK SURGERY     2 rods and 4 screws artificial disc   COLONOSCOPY  2008   INTERSTIM IMPLANT PLACEMENT  2007   INTERSTIM IMPLANT REVISION N/A 08/17/2012   Procedure: REPLACMENT OF IPG PLUS REPLACE LEAD OF INTERSTIM IMPLANT ;  Surgeon: [name], MD;  Location: [facility];  Service: Urology;  Laterality: N/A;   IRRIGATION AND DEBRIDEMENT KNEE Left 09/11/2021   Procedure: IRRIGATION AND DEBRIDEMENT WITH POLY EXCHANGE LEFT KNEE;  Surgeon: [name], MD;  Location: [facility];  Service: Orthopedics;  Laterality: Left;   IRRIGATION AND DEBRIDEMENT KNEE Left 06/01/2022   Procedure: IRRIGATION AND DEBRIDEMENT KNEE;  Surgeon: [name], MD;  Location: [facility];  Service: Orthopedics;  Laterality: Left;   LOWER EXTREMITY ANGIOGRAPHY Left 11/17/2020   Procedure: LOWER EXTREMITY ANGIOGRAPHY;  Surgeon: [name], MD;  Location: [facility];  Service: Cardiovascular;  Laterality: Left;   LOWER EXTREMITY ANGIOGRAPHY Right 12/22/2020   Procedure: LOWER EXTREMITY ANGIOGRAPHY;  Surgeon: [name], MD;  Location: [facility];  Service: Cardiovascular;  Laterality: Right;   LOWER EXTREMITY ANGIOGRAPHY Left 12/14/2021   Procedure: Lower Extremity Angiography;  Surgeon: [name], MD;  Location: [facility];  Service: Cardiovascular;  Laterality: Left;   LUMBAR DISC SURGERY  2005   L5   LUMBAR FUSION  X2  2006  &  2007   MANIPULATION KNEE JOINT Left 08/2002   OPEN DEBRIDEMENT LEFT TOTAL KNEE (SCAR, BONEY GRAOWTH)/ REMOVAL OLD SUTURES  05/01/2007   PARTIAL HIP ARTHROPLASTY Left 2001   x2   SHOULDER OPEN ROTATOR CUFF REPAIR Left 2000   TOTAL HIP ARTHROPLASTY Left 2002   TOTAL KNEE ARTHROPLASTY Left 06/2002   PARTIAL LEFT KNEE REPLACEMENT PRIOR TO THIS   TOTAL KNEE REVISION Left 2008   TOTAL KNEE REVISION Left 07/19/2021   Procedure: TOTAL KNEE REVISION;  Surgeon: [name], MD;  Location: [facility];  Service: Orthopedics;  Laterality: Left;   TOTAL KNEE REVISION Left 2005   TOTAL KNEE REVISION Left 06/01/2022   Procedure: TOTAL KNEE REVISION;  Surgeon: [name], MD;  Location: [facility];  Service: Orthopedics;  Laterality: Left;   TOTAL KNEE REVISION Left 04/01/2022   Procedure: Removal of left total knee implants with insertion of spacer.;  Surgeon: [name], MD;  Location: [facility];  Service: Orthopedics;  Laterality: Left;   TRIGGER FINGER RELEASE Bilateral    SEVERAL FINGERS   TRIGGER FINGER RELEASE Right 06/18/2014   Procedure: RELEASE TRIGGER FINGER/A-1 PULLEY;  Surgeon: Lynwood SQUIBB Mardee, MD;  Location: [facility];  Service: Orthopedics;  Laterality: Right;   Social History:  reports that he has never smoked. He has never used smokeless tobacco. He reports that he does not currently use alcohol. He reports that he does not currently use drugs after having used the  following drugs: Cocaine.  Allergies  Allergen Reactions   Suboxone [Buprenorphine Hcl-Naloxone  Hcl] Nausea And Vomiting   Tape Other (See Comments)    Whelps *Paper Tape is ok    Codeine Rash, Itching and Nausea Only   Morphine  And Codeine Itching and Rash   Other Rash    Telemetry electrodes   Silicone Rash    Whelps -*Paper Tape is ok     Family History  Problem Relation Age of Onset   Diabetes Mother    Arthritis Mother    Cancer Mother        lung cancer   Hyperlipidemia Mother    Stroke Mother    Alcohol abuse Father    Cancer Father        lung   Arthritis Father    Diabetes Brother     Prior to Admission  medications   Medication Sig Start Date End Date Taking? Authorizing Provider  atorvastatin  (LIPITOR) 20 MG tablet Take 20 mg by mouth at bedtime.    [provider]  celecoxib  (CELEBREX ) 200 MG capsule Take 1 capsule (200 mg total) by mouth 2 (two) times daily. 07/07/23   Bedsole, Amy E, MD  Continuous Glucose Sensor (FREESTYLE LIBRE 3 PLUS SENSOR) MISC CHANCE SENSOR EVERY 15 DAYS 08/02/23   Bedsole, Amy E, MD  cyclobenzaprine  (FLEXERIL ) 10 MG tablet 3 (three) times daily. 05/04/21   [provider]  empagliflozin  (JARDIANCE ) 25 MG TABS tablet Take 1 tablet (25 mg total) by mouth daily. 01/20/21   [name], Rosaline RAMAN, FNP  furosemide  (LASIX ) 20 MG tablet Take 1 tablet (20 mg total) by mouth daily. 08/18/23   Bedsole, Amy E, MD  insulin  aspart (NOVOLOG ) 100 UNIT/ML injection Per sliding scale  MAX 15 units in 24 hour period. 10/27/22   Bedsole, Amy E, MD  lidocaine  (LIDODERM ) 5 % Place 3 patches onto the skin daily. Remove & Discard patch within 12 hours or as directed by MD 07/18/23   [name], MD  lubiprostone  (AMITIZA ) 8 MCG capsule Take 1 capsule (8 mcg total) by mouth 2 (two) times daily with a meal. HAS failed mulitple OTC treatment for constipaiton 08/18/23   Bedsole, Amy E, MD  metFORMIN  (GLUCOPHAGE ) 1000 MG tablet Take 1,000 mg by mouth 2  (two) times daily. 06/19/20   [provider]  ondansetron  (ZOFRAN ) 4 MG tablet Take 1 tablet (4 mg total) by mouth every 8 (eight) hours as needed for nausea or vomiting. 01/18/22   Fayette Bodily, MD  Oxycodone  HCl 20 MG TABS Take 1 tablet by mouth every 6 (six) hours as needed.    [provider]  pantoprazole  (PROTONIX ) 40 MG tablet Take 1 tablet (40 mg total) by mouth daily. 11/17/20 07/18/23  Schnier, Cordella MATSU, MD  pregabalin  (LYRICA ) 200 MG capsule Take 1 capsule (200 mg total) by mouth in the morning, at noon, and at bedtime. 11/24/21   Bedsole, Amy E, MD  Semaglutide , 2 MG/DOSE, (OZEMPIC , 2 MG/DOSE,) 8 MG/3ML SOPN Inject 2 mg into the skin once a week.    [provider]  tadalafil (CIALIS) 20 MG tablet Take 20 mg by mouth daily as needed for erectile dysfunction.    [provider]  terbinafine  (LAMISIL ) 250 MG tablet Take 1 tablet (250 mg total) by mouth daily. 06/01/23   Janit Thresa HERO, DPM  zolpidem  (AMBIEN ) 10 MG tablet Take 1 tablet (10 mg total) by mouth at bedtime as needed. for sleep 07/18/23   [name], MD    Physical Exam: Vitals:   08/22/23 1048 08/22/23 1051 08/22/23 1430  BP: (!) 145/74  138/70  Pulse: 97  91  Resp: 20  14  Temp: 98.8 F (37.1 C)  98.5 F (36.9 C)  TempSrc: Oral    SpO2: 100%  100%  Weight:  81.6 kg   Height:  5' 9 (1.753 m)   Physical Exam Constitutional:      Appearance: Normal appearance.  HENT:     Head: Normocephalic and atraumatic.  Eyes:     Extraocular Movements: Extraocular movements intact.     Pupils: Pupils are equal, round, and reactive to light.  Cardiovascular:     Rate and Rhythm: Normal rate and regular rhythm.  Pulmonary:     Effort: Pulmonary effort is normal.     Breath sounds: Rales present.  Abdominal:  General: Abdomen is flat.  Musculoskeletal:        General: Normal range of motion.  Skin:    General: Skin is warm.     Comments: Tattoes in UE and LE  Neurological:      General: No focal deficit present.     Mental Status: He is alert and oriented to person, place, and time.      Assessment and Plan: Erik Martinez is a 68 y.o. 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  Patient comes into the emergency room referred by PCP after he started noticing increasing shortness of breath and bilateral lower extremity edema for one week. He was seen at PCPs office started on oral Lasix  for leg swelling. Bloodwork showed significant anemia of hemoglobin 7.3 with iron  deficiency and elevated BNP. He was asked to come to the emergency room.   Congestive heart failure acute , New onset in the setting of severe anemia -- admit to cardiac telemetry -- IV Lasix  -- monitoring per output -- patient came in with increasing shortness of breath, elevated BNP, bilateral lower extremity edema and chest x-ray consistent with pulmonary vascular congestion -- cardiology consultation with [facility] -- Echo of the heart  Iron  deficiency anemia severe -- baseline hemoglobin 10.4 to 11.4. -- Came in with hemoglobin of 7.3. Repeat 7.8 -- denies any G.I. bleed however does have history of Thalia and takes Celebrex  daily due to chronic pain --discussed with patient and agreeable for transfusion as needed- -G G.I.-consultation-- messaged Dr. Jinny -- Continue Protonix  -will start iron  pills -- will need to follow-up with hematology as outpatient -- check B12 levels -- no evidence of hematuria on urine analysis  Chronic pain syndrome narcotic dependence -- patient takes Celebrex , Flexeril , oxycodone  -- follows with pain clinic in [place]  Type II diabetes, hyperglycemia, non-insulin  requiring -- sliding scale insulin  -- patient takes metformin  and  Jardiance  at home -- last A1c was 7.4 (July 2025)  History of bipolar disorder -- await med rec and if patient on any psych meds will resume it   Advance Care Planning:   Code Status: Full Code   Consults: cardiology, G.I.  Family Communication: none  Severity of Illness: The appropriate patient status for this patient is INPATIENT. Inpatient status is judged to be reasonable and necessary in order to provide the required intensity of service to ensure the patient's safety. The patient's presenting symptoms, physical exam findings, and initial radiographic and laboratory data in the context of their chronic comorbidities is felt to place them at high risk for further clinical deterioration. Furthermore, it is not anticipated that the patient will be medically stable for discharge from the hospital within 2 midnights of admission.   * I certify that at the point of admission it is my clinical judgment that the patient will require inpatient hospital care spanning beyond 2 midnights from the point of admission due to high intensity of service, high risk for further deterioration and high frequency of surveillance required.*  Author: Leita Blanch, MD 08/22/2023 4:01 PM  For on call review [email or web address].

## 2023-08-22 NOTE — ED Triage Notes (Signed)
 Pt to ED from [facility] for hgb of 7.3 and BNP 1,787. Pt went there because for 5 days has had bilateral leg swelling at the end of the day and SOB with exertion. Pt endorses hematuria, no other known source of bleeding. Pt is not markedly pale. Respirations are unlabored. Legs not swollen at this time. Has been fatigued for about 1.5 weeks. Labs from yesterday are visible in chart, UA not resulted yet.

## 2023-08-22 NOTE — Telephone Encounter (Signed)
 Erik Martinez called into office.  I advised him of his lab results and the need to go to the ER for acute heart failure.  He will go now to [facility].  I called and spoke with triage nurse to advised them that he will be coming by car and to let them know why he is reporting to the ER.

## 2023-08-23 DIAGNOSIS — D509 Iron deficiency anemia, unspecified: Secondary | ICD-10-CM | POA: Diagnosis not present

## 2023-08-23 DIAGNOSIS — D649 Anemia, unspecified: Principal | ICD-10-CM | POA: Diagnosis present

## 2023-08-23 DIAGNOSIS — R7989 Other specified abnormal findings of blood chemistry: Secondary | ICD-10-CM

## 2023-08-23 DIAGNOSIS — R0602 Shortness of breath: Secondary | ICD-10-CM

## 2023-08-23 DIAGNOSIS — Z8619 Personal history of other infectious and parasitic diseases: Secondary | ICD-10-CM

## 2023-08-23 DIAGNOSIS — I509 Heart failure, unspecified: Secondary | ICD-10-CM | POA: Diagnosis not present

## 2023-08-23 DIAGNOSIS — R0600 Dyspnea, unspecified: Secondary | ICD-10-CM | POA: Diagnosis not present

## 2023-08-23 DIAGNOSIS — R600 Localized edema: Secondary | ICD-10-CM

## 2023-08-23 LAB — BASIC METABOLIC PANEL WITH GFR
Anion gap: 13 (ref 5–15)
BUN: 13 mg/dL (ref 8–23)
CO2: 26 mmol/L (ref 22–32)
Calcium: 8 mg/dL — ABNORMAL LOW (ref 8.9–10.3)
Chloride: 99 mmol/L (ref 98–111)
Creatinine, Ser: 1 mg/dL (ref 0.61–1.24)
GFR, Estimated: >60 mL/min (ref >60)
Glucose, Bld: 145 mg/dL — ABNORMAL HIGH (ref 70–99)
Potassium: 3.1 mmol/L — ABNORMAL LOW (ref 3.5–5.1)
Sodium: 138 mmol/L (ref 135–145)

## 2023-08-23 LAB — CBC
HCT: 25.5 % — ABNORMAL LOW (ref 39.0–52.0)
Hemoglobin: 7.7 g/dL — ABNORMAL LOW (ref 13.0–17.0)
MCH: 19.8 pg — ABNORMAL LOW (ref 26.0–34.0)
MCHC: 30.2 g/dL (ref 30.0–36.0)
MCV: 65.6 fL — ABNORMAL LOW (ref 80.0–100.0)
Platelets: 218 K/uL (ref 150–400)
RBC: 3.89 MIL/uL — ABNORMAL LOW (ref 4.22–5.81)
RDW: 20.7 % — ABNORMAL HIGH (ref 11.5–15.5)
WBC: 5.7 K/uL (ref 4.0–10.5)
nRBC: 0 % (ref 0.0–0.2)

## 2023-08-23 LAB — URINALYSIS, ROUTINE W REFLEX MICROSCOPIC
Bacteria, UA: NONE SEEN
Bilirubin Urine: NEGATIVE
Glucose, UA: >=500 mg/dL — AB
Hgb urine dipstick: NEGATIVE
Ketones, ur: NEGATIVE mg/dL
Leukocytes,Ua: NEGATIVE
Nitrite: NEGATIVE
Protein, ur: NEGATIVE mg/dL
Specific Gravity, Urine: 1.006 (ref 1.005–1.030)
Squamous Epithelial / HPF: 0 /HPF (ref 0–5)
WBC, UA: 0 WBC/hpf (ref 0–5)
pH: 7 (ref 5.0–8.0)

## 2023-08-23 LAB — HIV ANTIBODY (ROUTINE TESTING W REFLEX): HIV Screen 4th Generation wRfx: NONREACTIVE

## 2023-08-23 LAB — GLUCOSE, CAPILLARY
Glucose-Capillary: 147 mg/dL — ABNORMAL HIGH (ref 70–99)
Glucose-Capillary: 188 mg/dL — ABNORMAL HIGH (ref 70–99)
Glucose-Capillary: 218 mg/dL — ABNORMAL HIGH (ref 70–99)
Glucose-Capillary: 186 mg/dL — ABNORMAL HIGH (ref 70–99)

## 2023-08-23 MED ORDER — FUROSEMIDE 10 MG/ML IJ SOLN
40.0000 mg | Freq: Every day | INTRAMUSCULAR | Status: DC
  Administered 2023-08-24 – 2023-08-25 (×2): 40 mg via INTRAVENOUS
  Filled 2023-08-23 (×2): qty 4

## 2023-08-23 MED ORDER — POTASSIUM CHLORIDE CRYS ER 20 MEQ PO TBCR
40.0000 meq | EXTENDED_RELEASE_TABLET | Freq: Two times a day (BID) | ORAL | Status: AC
  Administered 2023-08-23 – 2023-08-24 (×5): 40 meq via ORAL
  Filled 2023-08-23 (×3): qty 2

## 2023-08-23 MED ORDER — IRON SUCROSE 200 MG IVPB - SIMPLE MED
200.0000 mg | Freq: Once | Status: AC
  Administered 2023-08-23 (×2): 200 mg via INTRAVENOUS
  Filled 2023-08-23: qty 200

## 2023-08-23 MED ORDER — SODIUM CHLORIDE 0.9 % IV SOLN: 3.0000 mg/kg | INTRAVENOUS | Status: DC

## 2023-08-23 NOTE — Discharge Instructions (Signed)
 Food Resources  Agency Name: [facility] Address: [address], [place], [place] [address] Phone: [phone] Website: [email or web address] Service(s) Offered: Housing services, self-sufficiency, congregate meal program, weatherization program, Event organiser program, emergency food assistance,  housing counseling, home ownership program, wheels - to work program.  [employer] free for 60 and older at various locations from [employer], Monday-Friday:  [employer], [address] [place], [phone] -[facility], [address], [place] [phone]  -[facility], [address] [place] [phone]  -[address], [address], [place], [phone]  Agency Name: [employer] Address: [phone] [address], [address], [place], [place] [address] Phone: [phone] Website: [email or web address] Service(s) Offered: Home delivered hot, frozen, and emergency  meals. Grocery assistance program which matches  volunteers one-on-one with seniors unable to grocery shop  for themselves. Must be 60 years and older; less than 20  hours of in-home aide service, limited or no driving ability;  live alone or with someone with a disability; live in  [place].  Agency Name: [employer] [facility] [employer]) Address: [address], [place], [place] [address] Phone: [phone] Service(s) Offered: Food is served to shut-ins, homeless, elderly, and low income people in the community every Saturday (11:30 am-12:30 pm) and Sunday (12:30 pm-1:30pm). Volunteers also offer help and encouragement in seeking employment,  and spiritual guidance.  Agency Name: [employer] Address: [address] [place], [place] [address] Phone: [phone] Service(s) Offered: Child support services; child welfare services; food stamps; Medicaid; work first family assistance; and aid  with fuel,  rent, food and medicine.  Agency Name: [employer] Address: [address], [place], [place] Phone: [phone] Website: [email or web address] Services Offered: Monday 10:00am-12:00, 8:00pm-9:00pm, and Friday 10:00am-12:00.  Agency Name: [employer] [facility] Address: [address] [place], [place] [address] Phone: [phone] Website: [email or web address] Service(s) Offered: Serves weekday meals, open from 11:30 am- 1:00 pm., and 6:30-7:30pm, Monday-Wednesday-Friday distributes food 3:30-6pm, Monday-Wednesday-Friday.  Agency Name: [facility] Address: [address], [place], [place] Phone: [phone] Website: [email or web address] Services Offered: Distributes food the 4th Saturday of the month, starting at 8:00 am  Agency Name: [facility] Address: [phone] [place], [place] [address] Phone: [phone] Website: [email or web address] Service(s) Offered: Bread of life, weekly food pantry. Open Wednesdays from 10:00am-noon.  Agency Name: [facility] [employer] Address: [address] [place], [place], [place] Phone: [phone] Services Offered: Distributes food 9am-1pm, Monday-Thursday. Call for details.  Agency Name: [facility] Address: [address] [place], [place] [address] Phone: [phone] Website: [email or web address] Service(s) Offered: Games developer. Call for assistance.  Agency Name: [facility] Address: [address], [place], [place] [address] Phone: [phone] Service Offered: Emergency Food Pantry. Call for appointment.  Agency Name: [facility] Address: [address], [place], [place] [address] Phone: [phone] Website: [email or web address] Services Offered: Games developer. Call for details  Agency Name: [facility] Address: [address] [place], [place] Phone:  [phone] Website: [email or web address] Service(s) Offered: Emergency Food Pantry. Call for details.  Agency Name: [employer] representative Address: [address], [place], [place] [address] Phone: [phone] or [phone] Website: [email or web address] Service(s) Offered: Distribute food 9am-11:30 am, Tuesday-Friday, and 1-3:30pm, Monday-Friday. Food pantry Monday-Friday 1pm-3pm, fresh items, Mon.-Wed.-Fri.  Agency Name: [facility] Address: [address] [place], [place] [address] Phone: [phone] Website: [email or web address] Services Offered: Distribute food Tues and Sats from 9:00am-noon.  Closed 1st Saturday of each month. Call for details  Agency Name: [employer] Address: Fayrene Boatman [facility] [address], [place] [address] Phone: [phone]  Services Offered: Delivers meals every Thursday

## 2023-08-23 NOTE — Plan of Care (Signed)

## 2023-08-23 NOTE — Consult Note (Signed)
 Erik Copping, MD [facility]  [address] [place], [place] [address] Phone: [phone] Fax : [phone]  Consultation  Referring Provider:     Dr. Tobie Primary Care Physician:  Erik Greig BRAVO, MD Primary Gastroenterologist: Erik Martinez         Reason for Consultation:     Iron  deficient anemia  Date of Admission:  08/22/2023 Date of Consultation:  08/23/2023         HPI:   Erik Martinez is a 68 y.o. male with a history of hepatitis C that was treated in the past and he reports that he was cured at that time.  The patient has a history of chronic pain diabetes and had abnormal lab work done by his primary care physician and told to come to the emergency department.  The patient was found to have anemia with the patient reporting that he has had hematuria prior to coming to the hospital that has now cleared up.  The patient appears to have chronic anemia with his previous labs showing:  Component     Latest Ref Rng 06/21/2022 10/25/2022 08/21/2023  Hemoglobin     13.0 - 17.0 g/dL 7.8 (L)  89.2 (L)  7.3 Repeated and verified X2. (LL)   HCT     39.0 - 52.0 % 27.2 (L)  35.2 (L)  23.6 Repeated and verified X2. (LL)    Component     Latest Ref Rng 08/22/2023 08/23/2023  Hemoglobin     13.0 - 17.0 g/dL 7.8 (L)  7.7 (L)   HCT     39.0 - 52.0 % 25.8 (L)  25.5 (L)    The patient's iron  studies showed an iron  of 8 with a iron  saturation of 2.  His liver functions also showed normal LFTs 1 month ago but his AST is now 108 with an ALT of 485 with normal bilirubin and normal alkaline phosphatase.  The patient denies any abdominal pain black stools or bloody stools.  He also denies any hematemesis.  Past Medical History:  Diagnosis Date   Anemia    Anxiety    Arthritis    Avascular necrosis of bones of both hips (HCC)    Bipolar disorder (HCC)    Bladder spasms    Chronic pain of left knee    Chronic pain syndrome    a.) followed by pain clinic   Cocaine use disorder,  moderate, dependence (HCC) 09/09/2015   DDD (degenerative disc disease)    Depression    Erectile dysfunction    a.) on PDE5i (sildenafil)   Frequency-urgency syndrome    GERD (gastroesophageal reflux disease)    Hepatitis C 2018   History of hepatitis B    1983  TX'D--  NO ISSUES OR SYMPTOMS SINCE   Hyperlipidemia    Hypotestosteronemia    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   Neurogenic bladder    Nocturia    PAD (peripheral artery disease) (HCC)    Polysubstance abuse (HCC)    a.) cocaine + marijuana + BZO + opioids   PONV (postoperative nausea and vomiting)    difficult to put me to sleep   Spinal headache    T2DM (type 2 diabetes mellitus) (HCC)    Urine incontinence     Past Surgical History:  Procedure Laterality Date   APPLICATION OF WOUND VAC Left 09/11/2021   Procedure: APPLICATION OF WOUND VAC;  Surgeon: [name], MD;  Location: [facility];  Service: Orthopedics;  Laterality: Left;  [id number]    BACK SURGERY     2 rods and 4 screws artificial disc   COLONOSCOPY  2008   INTERSTIM IMPLANT PLACEMENT  2007   INTERSTIM IMPLANT REVISION N/A 08/17/2012   Procedure: REPLACMENT OF IPG PLUS REPLACE LEAD OF INTERSTIM IMPLANT ;  Surgeon: [name], MD;  Location[facility];  Service: Urology;  Laterality: N/A;   IRRIGATION AND DEBRIDEMENT KNEE Left 09/11/2021   Procedure: IRRIGATION AND DEBRIDEMENT WITH POLY EXCHANGE LEFT KNEE;  Surgeon: [name], MD;  Location: [facility];  Service: Orthopedics;  Laterality: Left;   IRRIGATION AND DEBRIDEMENT KNEE Left 06/01/2022   Procedure: IRRIGATION AND DEBRIDEMENT KNEE;  Surgeon: [name], MD;  Location: [facility];  Service: Orthopedics;  Laterality: Left;   LOWER EXTREMITY ANGIOGRAPHY Left 11/17/2020   Procedure: LOWER EXTREMITY ANGIOGRAPHY;  Surgeon: [name], MD;  Location: [facility];  Service: Cardiovascular;  Laterality: Left;   LOWER EXTREMITY ANGIOGRAPHY Right  12/22/2020   Procedure: LOWER EXTREMITY ANGIOGRAPHY;  Surgeon: [name], MD;  Location: [facility];  Service: Cardiovascular;  Laterality: Right;   LOWER EXTREMITY ANGIOGRAPHY Left 12/14/2021   Procedure: Lower Extremity Angiography;  Surgeon: [name], MD;  Location: [facility];  Service: Cardiovascular;  Laterality: Left;   LUMBAR DISC SURGERY  2005   L5   LUMBAR FUSION  X2  2006  &  2007   MANIPULATION KNEE JOINT Left 08/2002   OPEN DEBRIDEMENT LEFT TOTAL KNEE (SCAR, BONEY GRAOWTH)/ REMOVAL OLD SUTURES  05/01/2007   PARTIAL HIP ARTHROPLASTY Left 2001   x2   SHOULDER OPEN ROTATOR CUFF REPAIR Left 2000   TOTAL HIP ARTHROPLASTY Left 2002   TOTAL KNEE ARTHROPLASTY Left 06/2002   PARTIAL LEFT KNEE REPLACEMENT PRIOR TO THIS   TOTAL KNEE REVISION Left 2008   TOTAL KNEE REVISION Left 07/19/2021   Procedure: TOTAL KNEE REVISION;  Surgeon: [name], MD;  Location: [facility];  Service: Orthopedics;  Laterality: Left;   TOTAL KNEE REVISION Left 2005   TOTAL KNEE REVISION Left 06/01/2022   Procedure: TOTAL KNEE REVISION;  Surgeon: [name], MD;  Location: [facility];  Service: Orthopedics;  Laterality: Left;   TOTAL KNEE REVISION Left 04/01/2022   Procedure: Removal of left total knee implants with insertion of spacer.;  Surgeon: [name], MD;  Location: [facility];  Service: Orthopedics;  Laterality: Left;   TRIGGER FINGER RELEASE Bilateral    SEVERAL FINGERS   TRIGGER FINGER RELEASE Right 06/18/2014   Procedure: RELEASE TRIGGER FINGER/A-1 PULLEY;  Surgeon: Lynwood SQUIBB Mardee, MD;  Location: [facility];  Service: Orthopedics;  Laterality: Right;    Prior to Admission medications   Medication Sig Start Date End Date Taking? Authorizing Provider  atorvastatin  (LIPITOR) 20 MG tablet Take 20 mg by mouth at bedtime.   Yes [provider]  celecoxib  (CELEBREX ) 200 MG capsule Take 1 capsule (200 mg total) by mouth 2 (two) times daily. 07/07/23   Yes Bedsole, Amy E, MD  cyclobenzaprine  (FLEXERIL ) 10 MG tablet 3 (three) times daily. 05/04/21  Yes [provider]  empagliflozin  (JARDIANCE ) 25 MG TABS tablet Take 1 tablet (25 mg total) by mouth daily. 01/20/21  Yes [name], Rosaline RAMAN, FNP  furosemide  (LASIX ) 20 MG tablet Take 1 tablet (20 mg total) by mouth daily. 08/18/23  Yes Bedsole, Amy E, MD  insulin  aspart (NOVOLOG ) 100 UNIT/ML injection Per sliding scale  MAX 15 units in 24 hour period. 10/27/22  Yes Bedsole, Amy E, MD  lidocaine  (LIDODERM ) 5 % Place 3 patches onto the skin daily. Remove & Discard patch within 12 hours or as directed by MD 07/18/23  Yes Bedsole, Amy E, MD  lubiprostone  (AMITIZA ) 8 MCG capsule Take 1 capsule (8 mcg total) by mouth 2 (two) times daily with a meal. HAS failed mulitple OTC treatment for constipaiton 08/18/23  Yes Bedsole, Amy E, MD  metFORMIN  (GLUCOPHAGE ) 1000 MG tablet Take 1,000 mg by mouth 2 (two) times daily. 06/19/20  Yes [provider]  ondansetron  (ZOFRAN ) 4 MG tablet Take 1 tablet (4 mg total) by mouth every 8 (eight) hours as needed for nausea or vomiting. 01/18/22  Yes Fayette Bodily, MD  Oxycodone  HCl 20 MG TABS Take 1 tablet by mouth every 6 (six) hours as needed.   Yes [provider]  pantoprazole  (PROTONIX ) 40 MG tablet Take 1 tablet (40 mg total) by mouth daily. 11/17/20 08/22/23 Yes Schnier, Cordella MATSU, MD  pregabalin  (LYRICA ) 200 MG capsule Take 1 capsule (200 mg total) by mouth in the morning, at noon, and at bedtime. 11/24/21  Yes Bedsole, Amy E, MD  Semaglutide , 2 MG/DOSE, (OZEMPIC , 2 MG/DOSE,) 8 MG/3ML SOPN Inject 2 mg into the skin once a week. Monday   Yes [provider]  tadalafil (CIALIS) 20 MG tablet Take 20 mg by mouth daily as needed for erectile dysfunction.   Yes [provider]  terbinafine  (LAMISIL ) 250 MG tablet Take 1 tablet (250 mg total) by mouth daily. 06/01/23  Yes Janit Thresa HERO, DPM  zolpidem  (AMBIEN ) 10 MG tablet Take 1 tablet  (10 mg total) by mouth at bedtime as needed. for sleep 07/18/23  Yes Bedsole, Amy E, MD  Continuous Glucose Sensor (FREESTYLE LIBRE 3 PLUS SENSOR) MISC CHANCE SENSOR EVERY 15 DAYS 08/02/23   Erik Greig BRAVO, MD    Family History  Problem Relation Age of Onset   Diabetes Mother    Arthritis Mother    Cancer Mother        lung cancer   Hyperlipidemia Mother    Stroke Mother    Alcohol abuse Father    Cancer Father        lung   Arthritis Father    Diabetes Brother      Social History   Tobacco Use   Smoking status: Never   Smokeless tobacco: Never  Vaping Use   Vaping status: Never Used  Substance Use Topics   Alcohol use: Not Currently    Alcohol/week: 0.0 - 2.0 standard drinks of alcohol   Drug use: Not Currently    Types: Cocaine    Comment: last + cocaine UDS 07/2016    Allergies as of 08/22/2023 - Review Complete 08/22/2023  Allergen Reaction Noted   Suboxone [buprenorphine hcl-naloxone  hcl] Nausea And Vomiting 07/15/2016   Tape Other (See Comments) 10/11/2016   Codeine Rash, Itching, and Nausea Only 08/10/2012   Morphine  and codeine Itching and Rash 08/10/2012   Other Rash 07/15/2016   Silicone Rash 06/06/2013    Review of Systems:    All systems reviewed and negative except where noted in HPI.   Physical Exam:  Vital signs in last 24 hours: Temp:  [98.3 F (36.8 C)-98.5 F (36.9 C)] 98.3 F (36.8 C) (08/13 0741) Pulse Rate:  [91-101] 99 (08/13 0741) Resp:  [14-16] 16 (08/13 0741) BP: (110-138)/(54-70) 110/54 (08/13 0741) SpO2:  [96 %-100 %] 99 % (08/13 0741)  General:   Pleasant, cooperative in NAD Head:  Normocephalic and atraumatic. Eyes:   No icterus.   Conjunctiva pink. PERRLA. Ears:  Normal auditory acuity. Neck:  Supple; no masses or thyroidomegaly Lungs: Respirations even and unlabored. Lungs clear to auscultation bilaterally.   No wheezes, crackles, or rhonchi.  Heart:  Regular rate and rhythm;  Without murmur, clicks, rubs or gallops Abdomen:   Soft, nondistended, nontender. Normal bowel sounds. No appreciable masses or hepatomegaly.  No rebound or guarding.  Rectal:  Not performed. Msk:  Symmetrical without gross deformities.   Extremities:  Without cyanosis or clubbing.  There is some lower extremity pitting edema Neurologic:  Alert and oriented x3;  grossly normal neurologically. Skin:  Intact without significant lesions or rashes. Cervical Nodes:  No significant cervical adenopathy. Psych:  Alert and cooperative. Normal affect.  LAB RESULTS: Recent Labs    08/21/23 1016 08/22/23 1053 08/23/23 0400  WBC 4.5 5.1 5.7  HGB 7.3 Repeated and verified X2.* 7.8* 7.7*  HCT 23.6 Repeated and verified X2.* 25.8* 25.5*  PLT 189.0 226 218   BMET Recent Labs    08/21/23 1016 08/22/23 1053 08/23/23 0400  NA 133* 134* 138  K 3.7 3.3* 3.1*  CL 100 99 99  CO2 24 24 26   GLUCOSE 231* 135* 145*  BUN 18 14 13   CREATININE 1.01 0.94 1.00  CALCIUM  7.5* 8.0* 8.0*   LFT Recent Labs    08/22/23 1053  PROT 6.4*  ALBUMIN 2.7*  AST 108*  ALT 485*  ALKPHOS 95  BILITOT 1.0  BILIDIR 0.3*  IBILI 0.7   PT/INR Recent Labs    08/22/23 1430  LABPROT 16.4*  INR 1.2    STUDIES: US  Venous Img Lower Bilateral Result Date: 08/22/2023 CLINICAL DATA:  Leg edema x5 days with shortness of breath. EXAM: BILATERAL LOWER EXTREMITY VENOUS DOPPLER ULTRASOUND TECHNIQUE: Gray-scale sonography with graded compression, as well as color Doppler and duplex ultrasound were performed to evaluate the lower extremity deep venous systems from the level of the common femoral vein and including the common femoral, femoral, profunda femoral, popliteal and calf veins including the posterior tibial, peroneal and gastrocnemius veins when visible. The superficial great saphenous vein was also interrogated. Spectral Doppler was utilized to evaluate flow at rest and with distal augmentation maneuvers in the common femoral, femoral and popliteal veins. COMPARISON:   None Available. FINDINGS: RIGHT LOWER EXTREMITY Common Femoral Vein: No evidence of thrombus. Normal compressibility, respiratory phasicity and response to augmentation. Saphenofemoral Junction: No evidence of thrombus. Normal compressibility and flow on color Doppler imaging. Profunda Femoral Vein: No evidence of thrombus. Normal compressibility and flow on color Doppler imaging. Femoral Vein: No evidence of thrombus. Normal compressibility, respiratory phasicity and response to augmentation. Popliteal Vein: No evidence of thrombus. Normal compressibility, respiratory phasicity and response to augmentation. Calf Veins: The RIGHT peroneal vein is limited in visualization secondary to calf edema. No evidence of thrombus within the RIGHT posterior tibial vein. Normal compressibility and flow on color Doppler imaging. Superficial Great Saphenous Vein: No evidence of thrombus. Normal compressibility. Venous Reflux:  None. Other Findings:  None. LEFT LOWER EXTREMITY Common Femoral Vein: No evidence of thrombus. Normal compressibility, respiratory phasicity and response to augmentation. Saphenofemoral Junction: No evidence of thrombus. Normal compressibility and flow on color Doppler imaging. Profunda Femoral Vein: No evidence of thrombus. Normal compressibility and flow on color Doppler imaging. Femoral Vein: No evidence of thrombus. Normal compressibility, respiratory phasicity and response to augmentation. Popliteal Vein: No evidence of  thrombus. Normal compressibility, respiratory phasicity and response to augmentation. Calf Veins: The LEFT peroneal vein is limited in visualization secondary to calf edema no evidence of thrombus within the LEFT posterior tibial vein. Normal compressibility and flow on color Doppler imaging. Superficial Great Saphenous Vein: No evidence of thrombus. Normal compressibility. Venous Reflux:  None. Other Findings:  None. IMPRESSION: Limited evaluation of the BILATERAL calf veins, without  evidence of deep venous thrombosis in either lower extremity. Electronically Signed   By: Suzen Dials M.D.   On: 08/22/2023 16:37   DG Chest 2 View Result Date: 08/22/2023 CLINICAL DATA:  Dyspnea.  Bilateral lower extremity swelling. EXAM: CHEST - 2 VIEW COMPARISON:  03/12/2017. FINDINGS: The heart size and mediastinal contours are within normal limits. Aortic atherosclerosis. Small bilateral pleural effusions, more pronounced on the right. Bilateral interstitial prominence, likely reflects interstitial pulmonary edema. No pneumothorax. No acute osseous abnormality. IMPRESSION: 1. Small bilateral pleural effusions, more pronounced on the right. 2. Findings favored to reflect interstitial pulmonary edema. Electronically Signed   By: Harrietta Sherry M.D.   On: 08/22/2023 15:09      Impression / Plan:   Assessment: Principal Problem:   Acute CHF (congestive heart failure) (HCC) Active Problems:   Dyspnea   Iron  deficiency anemia   Erik Martinez is a 68 y.o. y/o male with iron  deficient anemia with a history of hep C that has been treated in the past.  The patient is now in the hospital for CHF.  The patient will need a luminal evaluation for the cause of his iron  deficient anemia.  The patient will also have his blood sent off for a GGT to see if his abnormal liver enzymes are from a liver source.  I would also recommend trending the liver enzymes.  Plan:  I will await clearance from cardiology prior to setting the patient up for an EGD and colonoscopy.  The patient has been explained the plan and the need to take a prep for the procedure.  He has also been told about the risks and alternatives.  He agrees to proceeding with the EGD and colonoscopy if cleared by cardiology.  Thank you for involving me in the care of this patient.      LOS: 1 day   Erik Copping, MD, MD. [place] 08/23/2023, 10:53 AM,  Pager [phone] 7am-5pm  Check AMION for 5pm -7am coverage and on weekends   Note:  This dictation was prepared with Dragon dictation along with smaller phrase technology. Any transcriptional errors that result from this process are unintentional.

## 2023-08-23 NOTE — Consult Note (Signed)
 Cardiology Consultation   Patient ID: Erik Martinez MRN: [id number]; DOB: 02-27-1955  Admit date: 08/22/2023 Date of Consult: 08/23/2023  PCP:  [name], MD   [facility] Providers Cardiologist:  None        Patient Profile: Erik Martinez is a 68 y.o. male with a hx of bipolar disorder, GERD, history of hepatitis C and B s/p treatment, and chronic pain syndrome who is being seen 08/23/2023 for the evaluation of volume overload at the request of Dr. Tobie.  History of Present Illness: [name] has no significant cardiac history.  He reports several days of worsening dyspnea and lower extremity swelling.  He was seen outpatient by his PCP who did labs and referred him for urgent echocardiogram.  Lab work came back which showed severe anemia and he was referred to the ED for further evaluation.  In the ED, BP 145/74 with otherwise normal vital signs.  Pertinent labs include potassium 3.3, glucose 135, albumin 2.7, AST 108, ALT 45, hemoglobin 7.8, hematocrit 25.8, MCV 66.7, MCH 20.2, RDW 21.3, RBC 3.87.  BNP 1782.  Troponin 108.  D-dimer 2.66.  Lower extremity ultrasound negative for DVT bilaterally.  Patient was given IV Protonix  in the ED, although there were no obvious signs of GI bleed.  He was given IV Lasix  40 mg x 2.  Cardiology was asked to consult for further evaluation of volume overload.  At time of cardiology consult, patient reports improvements in symptoms.  He remains volume overloaded on exam.  He denies chest pain, lightheadedness, dizziness, palpitations, orthopnea, and PND.  Past Medical History:  Diagnosis Date   Anemia    Anxiety    Arthritis    Avascular necrosis of bones of both hips (HCC)    Bipolar disorder (HCC)    Bladder spasms    Chronic pain of left knee    Chronic pain syndrome    a.) followed by pain clinic   Cocaine use disorder, moderate, dependence (HCC) 09/09/2015   DDD (degenerative disc disease)    Depression    Erectile  dysfunction    a.) on PDE5i (sildenafil)   Frequency-urgency syndrome    GERD (gastroesophageal reflux disease)    Hepatitis C 2018   History of hepatitis B    1983  TX'D--  NO ISSUES OR SYMPTOMS SINCE   Hyperlipidemia    Hypotestosteronemia    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   Neurogenic bladder    Nocturia    PAD (peripheral artery disease) (HCC)    Polysubstance abuse (HCC)    a.) cocaine + marijuana + BZO + opioids   PONV (postoperative nausea and vomiting)    difficult to put me to sleep   Spinal headache    T2DM (type 2 diabetes mellitus) (HCC)    Urine incontinence     Past Surgical History:  Procedure Laterality Date   APPLICATION OF WOUND VAC Left 09/11/2021   Procedure: APPLICATION OF WOUND VAC;  Surgeon: [name], MD;  Location: [facility];  Service: Orthopedics;  Laterality: Left;  [id number]    BACK SURGERY     2 rods and 4 screws artificial disc   COLONOSCOPY  2008   INTERSTIM IMPLANT PLACEMENT  2007   INTERSTIM IMPLANT REVISION N/A 08/17/2012   Procedure: REPLACMENT OF IPG PLUS REPLACE LEAD OF INTERSTIM IMPLANT ;  Surgeon: [name], MD;  Location: [facility];  Service: Urology;  Laterality: N/A;   IRRIGATION AND DEBRIDEMENT KNEE Left  09/11/2021   Procedure: IRRIGATION AND DEBRIDEMENT WITH POLY EXCHANGE LEFT KNEE;  Surgeon: [name], MD;  Location: [facility];  Service: Orthopedics;  Laterality: Left;   IRRIGATION AND DEBRIDEMENT KNEE Left 06/01/2022   Procedure: IRRIGATION AND DEBRIDEMENT KNEE;  Surgeon: [name], MD;  Location: [facility];  Service: Orthopedics;  Laterality: Left;   LOWER EXTREMITY ANGIOGRAPHY Left 11/17/2020   Procedure: LOWER EXTREMITY ANGIOGRAPHY;  Surgeon: [name], MD;  Location: [facility];  Service: Cardiovascular;  Laterality: Left;   LOWER EXTREMITY ANGIOGRAPHY Right 12/22/2020   Procedure: LOWER EXTREMITY ANGIOGRAPHY;  Surgeon: [name], MD;  Location: [facility]VASIVE [facility];  Service: Cardiovascular;  Laterality: Right;   LOWER EXTREMITY ANGIOGRAPHY Left 12/14/2021   Procedure: Lower Extremity Angiography;  Surgeon: [name], MD;  Location: [facility];  Service: Cardiovascular;  Laterality: Left;   LUMBAR DISC SURGERY  2005   L5   LUMBAR FUSION  X2  2006  &  2007   MANIPULATION KNEE JOINT Left 08/2002   OPEN DEBRIDEMENT LEFT TOTAL KNEE (SCAR, BONEY GRAOWTH)/ REMOVAL OLD SUTURES  05/01/2007   PARTIAL HIP ARTHROPLASTY Left 2001   x2   SHOULDER OPEN ROTATOR CUFF REPAIR Left 2000   TOTAL HIP ARTHROPLASTY Left 2002   TOTAL KNEE ARTHROPLASTY Left 06/2002   PARTIAL LEFT KNEE REPLACEMENT PRIOR TO THIS   TOTAL KNEE REVISION Left 2008   TOTAL KNEE REVISION Left 07/19/2021   Procedure: TOTAL KNEE REVISION;  Surgeon: [name], MD;  Location: [facility];  Service: Orthopedics;  Laterality: Left;   TOTAL KNEE REVISION Left 2005   TOTAL KNEE REVISION Left 06/01/2022   Procedure: TOTAL KNEE REVISION;  Surgeon: [name], MD;  Location: [facility];  Service: Orthopedics;  Laterality: Left;   TOTAL KNEE REVISION Left 04/01/2022   Procedure: Removal of left total knee implants with insertion of spacer.;  Surgeon: [name], MD;  Location: [facility];  Service: Orthopedics;  Laterality: Left;   TRIGGER FINGER RELEASE Bilateral    SEVERAL FINGERS   TRIGGER FINGER RELEASE Right 06/18/2014   Procedure: RELEASE TRIGGER FINGER/A-1 PULLEY;  Surgeon: Lynwood SQUIBB Mardee, MD;  Location: [facility];  Service: Orthopedics;  Laterality: Right;       Scheduled Meds:  atorvastatin   20 mg Oral QHS   cyclobenzaprine   10 mg Oral TID   empagliflozin   25 mg Oral Daily   furosemide   40 mg Intravenous BID   insulin  aspart  0-5 Units Subcutaneous QHS   insulin  aspart  0-9 Units Subcutaneous TID WC   iron  polysaccharides  150 mg Oral Daily   potassium chloride   40 mEq Oral BID   pregabalin   200 mg Oral TID   Continuous Infusions:  iron  sucrose  200 mg (08/23/23 1447)   PRN Meds: acetaminophen  **OR** acetaminophen , oxyCODONE , polyethylene glycol, zolpidem   Allergies:    Allergies  Allergen Reactions   Suboxone [Buprenorphine Hcl-Naloxone  Hcl] Nausea And Vomiting   Tape Other (See Comments)    Whelps *Paper Tape is ok    Codeine Rash, Itching and Nausea Only   Morphine  And Codeine Itching and Rash   Other Rash    Telemetry electrodes   Silicone Rash    Whelps -*Paper Tape is ok     Social History:   Social History   Socioeconomic History   Marital status: Divorced    Spouse name: Not on file   Number of children: 3  Years of education: Not on file   Highest education level: Some college, no degree  Occupational History   Not on file  Tobacco Use   Smoking status: Never   Smokeless tobacco: Never  Vaping Use   Vaping status: Never Used  Substance and Sexual Activity   Alcohol use: Not Currently    Alcohol/week: 0.0 - 2.0 standard drinks of alcohol   Drug use: Not Currently    Types: Cocaine    Comment: last + cocaine UDS 07/2016   Sexual activity: Not Currently  Other Topics Concern   Not on file  Social History Narrative    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   Overall Financial Resource Strain (CARDIA)    Difficulty of Paying Living Expenses: Not very hard  Food Insecurity: Food Insecurity Present (07/11/2023)   Received from [facility]   Hunger Vital Sign    Within the past 12 months, you worried that your food would run out before you got the money to buy more.: Sometimes true    Within the past 12 months, the food you bought just didn't last and you didn't have money to get more.: Never true  Transportation Needs: No Transportation Needs (07/11/2023)   Received from [facility] - Transportation    In the past 12 months, has lack of  transportation kept you from medical appointments or from getting medications?: No    Lack of Transportation (Non-Medical): No  Physical Activity: Inactive (01/28/2022)   Exercise Vital Sign    Days of Exercise per Week: 0 days    Minutes of Exercise per Session: 0 min  Stress: No Stress Concern Present (01/28/2022)   [employer] of Occupational Health - Occupational Stress Questionnaire    Feeling of Stress : Not at all  Social Connections: Moderately Isolated (01/28/2022)   Social Connection and Isolation Panel    Frequency of Communication with Friends and Family: More than three times a week    Frequency of Social Gatherings with Friends and Family: More than three times a week    Attends Religious Services: More than 4 times per year    Active Member of [employer] or Organizations: No    Attends Banker Meetings: Never    Marital Status: Divorced  Catering manager Violence: Unknown (06/01/2022)   Humiliation, Afraid, Rape, and Kick questionnaire    Fear of Current or Ex-Partner: No    Emotionally Abused: No    Physically Abused: No    Sexually Abused: Not on file    Family History:    Family History  Problem Relation Age of Onset   Diabetes Mother    Arthritis Mother    Cancer Mother        lung cancer   Hyperlipidemia Mother    Stroke Mother    Alcohol abuse Father    Cancer Father        lung   Arthritis Father    Diabetes Brother      ROS:  Please see the history of present illness.   Physical Exam/Data: Vitals:   08/22/23 2346 08/23/23 0334 08/23/23 0741 08/23/23 1213  BP: 118/62 119/66 (!) 110/54 (!) 123/58  Pulse: (!) 101 92 99 99  Resp: 16 16 16 18   Temp: 98.5 F (36.9 C) 98.5 F (36.9 C) 98.3 F (  36.8 C) 98.3 F (36.8 C)  TempSrc:   Oral   SpO2: 96% 99% 99% 99%  Weight:      Height:        Intake/Output Summary (Last 24 hours) at 08/23/2023 1459 Last data filed at 08/23/2023 1448 Gross per 24 hour  Intake 360 ml  Output 1475 ml   Net -1115 ml      08/22/2023   10:51 AM 08/18/2023    3:42 PM 07/18/2023   10:29 AM  Last 3 Weights  Weight (lbs) 180 lb 189 lb 2 oz 179 lb 6 oz  Weight (kg) 81.647 kg 85.787 kg 81.364 kg     Body mass index is 26.58 kg/m.  General:  Well nourished, well developed, in no acute distress HEENT: normal Neck: no JVD Vascular: No carotid bruits; Distal pulses 2+ bilaterally Cardiac:  normal S1, S2; RRR; no murmur  Lungs:  clear to auscultation bilaterally, no wheezing, rhonchi or rales  Abd: soft, nontender, no hepatomegaly  Ext: 1 + LE edema Skin: warm and dry  Psych:  Normal affect   EKG:  The EKG was personally reviewed and demonstrates:  Sinus rhythm with nonspecific ST/T wave abnormality, rate 78 bpm  Relevant CV Studies:  Echo pending  Laboratory Data: High Sensitivity Troponin:   Recent Labs  Lab 08/22/23 1321  TROPONINIHS 108*     Chemistry Recent Labs  Lab 08/21/23 1016 08/22/23 1053 08/22/23 1321 08/23/23 0400  NA 133* 134*  --  138  K 3.7 3.3*  --  3.1*  CL 100 99  --  99  CO2 24 24  --  26  GLUCOSE 231* 135*  --  145*  BUN 18 14  --  13  CREATININE 1.01 0.94  --  1.00  CALCIUM  7.5* 8.0*  --  8.0*  MG  --   --  2.0  --   GFRNONAA  --  >60  --  >60  ANIONGAP  --  11  --  13    Recent Labs  Lab 08/22/23 1053  PROT 6.4*  ALBUMIN 2.7*  AST 108*  ALT 485*  ALKPHOS 95  BILITOT 1.0   Lipids No results for input(s): CHOL, TRIG, HDL, LABVLDL, LDLCALC, CHOLHDL in the last 168 hours.  Hematology Recent Labs  Lab 08/21/23 1016 08/22/23 1053 08/23/23 0400  WBC 4.5 5.1 5.7  RBC 3.67* 3.87* 3.89*  HGB 7.3 Repeated and verified X2.* 7.8* 7.7*  HCT 23.6 Repeated and verified X2.* 25.8* 25.5*  MCV 64.1* 66.7* 65.6*  MCH  --  20.2* 19.8*  MCHC 30.8 30.2 30.2  RDW 21.7* 21.3* 20.7*  PLT 189.0 226 218   Thyroid  No results for input(s): TSH, FREET4 in the last 168 hours.  BNP Recent Labs  Lab 08/21/23 1016 08/22/23 1053  BNP  --   1,782.6*  PROBNP 1,787.0*  --     DDimer  Recent Labs  Lab 08/22/23 1321  DDIMER 2.66*    Radiology/Studies:  DG Chest 2 View Result Date: 08/22/2023 IMPRESSION: 1. Small bilateral pleural effusions, more pronounced on the right. 2. Findings favored to reflect interstitial pulmonary edema. Electronically Signed   By: Harrietta Sherry M.D.   On: 08/22/2023 15:09   Assessment and Plan:  Volume overload - Patient presenting with several days of shortness of breath and lower extremity swelling - BNP 1782 on admission - Chest x-ray with small bilateral pleural effusions and interstitial pulmonary edema - Received IV Lasix  40 mg x 2  with net output -1.4 L - Patient remains volume overloaded - Continue IV Lasix  40 mg twice daily - Suspect that volume overload and shortness of breath could be in the setting of significant anemia - Echo ordered with further recommendations pending results  Anemia - Hemoglobin 7.3 on admission, down significantly from baseline of 10-11. Iron  8.  - Patient reports mild hematuria with no other obvious bleeding - Low threshold for transfusion - Received IV iron  - GI consulted and plan for endoscopy pending cardiac workup - Ongoing management per IM  For questions or updates, please contact [facility] Please consult [email or web address] for contact info under    Signed, Lesley LITTIE Maffucci, PA-C  08/23/2023 2:59 PM

## 2023-08-23 NOTE — TOC CM/SW Note (Signed)
 Transition of Care [facility]) - Inpatient Brief Assessment   Patient Details  Name: Erik Martinez MRN: [id number] Date of Birth: 09-06-1955  [facility] CM/SW Contact:    Lauraine JAYSON Carpen, LCSW Phone Number: 08/23/2023, 4:21 PM   Clinical Narrative: CSW reviewed chart. SDOH flag for food insecurity. Resources added to AVS. No other TOC needs identified so far. CSW will continue to follow progress. Please place [facility] consult if any needs arise.  Transition of Care Asessment: Insurance and Status: Insurance coverage has been reviewed Patient has primary care physician: Yes Home environment has been reviewed: Single family home Prior level of function:: Not documented Prior/Current Home Services: No current home services Social Drivers of Health Review: SDOH reviewed interventions complete Readmission risk has been reviewed: Yes Transition of care needs: no transition of care needs at this time

## 2023-08-23 NOTE — Progress Notes (Signed)
 Heart Failure Nurse Navigator Progress Note  PCP: [name], MD PCP-Cardiologist: [facility] Admission Diagnosis: Symptomatic anemia Dyspnea, unspecified type Admitted from: Home  Presentation:   Erik Martinez presented with shortness of breath and exertional dyspnea over the past 5-6 days and abnormal lab work. Patient was seen at his PCP and had lab work done and was called back and told that he had critical lab work values and needed to go to the ED.  Patient was found to have significant anemia with a HGB 7.3 from a baseline of 10.7.  He did also complain of blood in his urine.  Significant swelling to both of his legs and feels like its worse in his left leg. BP 145/74 Pulse 97. Hx: Bipolar disorder, GERD, History of Hepatitis C & B s/p treatment and chronic pain syndrome. No chest pain. BNP 1,787. HS-Troponin-108. Chest x-ray: small bilateral pleural effusions and interstitial pulmonary edema. New onset of  acute CHF in the setting of severe iron  deficiency anemia.  ECHO/ LVEF: Performed 08/22/23  Clinical Course:  Past Medical History:  Diagnosis Date   Anemia    Anxiety    Arthritis    Avascular necrosis of bones of both hips (HCC)    Bipolar disorder (HCC)    Bladder spasms    Chronic pain of left knee    Chronic pain syndrome    a.) followed by pain clinic   Cocaine use disorder, moderate, dependence (HCC) 09/09/2015   DDD (degenerative disc disease)    Depression    Erectile dysfunction    a.) on PDE5i (sildenafil)   Frequency-urgency syndrome    GERD (gastroesophageal reflux disease)    Hepatitis C 2018   History of hepatitis B    1983  TX'D--  NO ISSUES OR SYMPTOMS SINCE   Hyperlipidemia    Hypotestosteronemia    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   Neurogenic bladder    Nocturia    PAD (peripheral artery disease) (HCC)    Polysubstance abuse (HCC)    a.) cocaine + marijuana + BZO + opioids   PONV (postoperative nausea and vomiting)    difficult  to put me to sleep   Spinal headache    T2DM (type 2 diabetes mellitus) (HCC)    Urine incontinence      Social History   Socioeconomic History   Marital status: Divorced    Spouse name: Not on file   Number of children: 3   Years of education: Not on file   Highest education level: Some college, no degree  Occupational History   Not on file  Tobacco Use   Smoking status: Never   Smokeless tobacco: Never  Vaping Use   Vaping status: Never Used  Substance and Sexual Activity   Alcohol use: Not Currently    Alcohol/week: 0.0 - 2.0 standard drinks of alcohol   Drug use: Not Currently    Types: Cocaine    Comment: last + cocaine UDS 07/2016   Sexual activity: Not Currently  Other Topics Concern   Not on file  Social History Narrative    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   Overall Financial Resource Strain (CARDIA)    Difficulty of Paying Living Expenses: Not very hard  Food Insecurity: Food Insecurity Present (07/11/2023)   Received from [facility]   Hunger Vital Sign  Within the past 12 months, you worried that your food would run out before you got the money to buy more.: Sometimes true    Within the past 12 months, the food you bought just didn't last and you didn't have money to get more.: Never true  Transportation Needs: No Transportation Needs (07/11/2023)   Received from [facility] Transportation    In the past 12 months, has lack of transportation kept you from medical appointments or from getting medications?: No    Lack of Transportation (Non-Medical): No  Physical Activity: Inactive (01/28/2022)   Exercise Vital Sign    Days of Exercise per Week: 0 days    Minutes of Exercise per Session: 0 min  Stress: No Stress Concern Present (01/28/2022)   [employer] of Occupational Health -  Occupational Stress Questionnaire    Feeling of Stress : Not at all  Social Connections: Moderately Isolated (01/28/2022)   Social Connection and Isolation Panel    Frequency of Communication with Friends and Family: More than three times a week    Frequency of Social Gatherings with Friends and Family: More than three times a week    Attends Religious Services: More than 4 times per year    Active Member of [employer] or Organizations: No    Attends Engineer, structural: Never    Marital Status: Divorced   Water engineer and Provision:  Detailed education and instructions provided on heart failure disease management including the following:  Signs and symptoms of Heart Failure When to call the physician Importance of daily weights Low sodium diet Fluid restriction Medication management Anticipated future follow-up appointments  Patient education given on each of the above topics.  Patient acknowledges understanding via teach back method and acceptance of all instructions.  Education Materials:  Living Better With Heart Failure Booklet, HF zone tool, & Daily Weight Tracker Tool.  Patient has scale at home: Yes Patient has pill box at home: Yes    High Risk Criteria for Readmission and/or Poor Patient Outcomes: Heart failure hospital admissions (last 6 months): 0  No Show rate: 16% Difficult social situation: None Demonstrates medication adherence: Yes Primary Language: English Literacy level: Reading, Writing & Comprehension  Barriers of Care:   High Daily Sodium intake  Considerations/Referrals:   Referral made to Heart Failure Pharmacist Stewardship: No Referral made to Heart Failure CSW/NCM TOC: No Referral made to Heart & Vascular TOC clinic: Yes. AHF Clinic appointment 09/01/23 @ 2:30 PM  Items for Follow-up on DC/TOC: Diet & Fluid Restrictions Daily Weights Continued Heart Failure Education  Charmaine Pines, RN, BSN [facility] Heart Failure  Navigator Secure Chat Only

## 2023-08-23 NOTE — Progress Notes (Addendum)
 [facility]  - [place] [facility]   PATIENT NAME: Erik Martinez    MR#:  [id number]  DATE OF BIRTH:  1955/02/16  SUBJECTIVE:  No family at bedside. Per pt good uop Staff has not documented UOP!! Denies sob at present. No blood in stool or urine   VITALS:  Blood pressure (!) 123/58, pulse 99, temperature 98.3 F (36.8 C), resp. rate 18, height 5' 9 (1.753 m), weight 81.6 kg, SpO2 99%.  PHYSICAL EXAMINATION:   GENERAL:  68 y.o.-year-old patient with no acute distress.  LUNGS: Normal breath sounds bilaterally, no wheezing few bibasilar rales CARDIOVASCULAR: S1, S2 normal. No murmur   ABDOMEN: Soft, nontender, nondistended.  EXTREMITIES: ++  edema b/l.    NEUROLOGIC: nonfocal  patient is alert and awake SKIN:tattoo +  LABORATORY PANEL:  CBC Recent Labs  Lab 08/23/23 0400  WBC 5.7  HGB 7.7*  HCT 25.5*  PLT 218    Chemistries  Recent Labs  Lab 08/22/23 1053 08/22/23 1321 08/23/23 0400  NA 134*  --  138  K 3.3*  --  3.1*  CL 99  --  99  CO2 24  --  26  GLUCOSE 135*  --  145*  BUN 14  --  13  CREATININE 0.94  --  1.00  CALCIUM  8.0*  --  8.0*  MG  --  2.0  --   AST 108*  --   --   ALT 485*  --   --   ALKPHOS 95  --   --   BILITOT 1.0  --   --    Cardiac Enzymes No results for input(s): TROPONINI in the last 168 hours. RADIOLOGY:  US  Venous Img Lower Bilateral Result Date: 08/22/2023 CLINICAL DATA:  Leg edema x5 days with shortness of breath. EXAM: BILATERAL LOWER EXTREMITY VENOUS DOPPLER ULTRASOUND TECHNIQUE: Gray-scale sonography with graded compression, as well as color Doppler and duplex ultrasound were performed to evaluate the lower extremity deep venous systems from the level of the common femoral vein and including the common femoral, femoral, profunda femoral, popliteal and calf veins including the posterior tibial, peroneal and gastrocnemius veins when visible. The superficial great saphenous vein was also interrogated. Spectral  Doppler was utilized to evaluate flow at rest and with distal augmentation maneuvers in the common femoral, femoral and popliteal veins. COMPARISON:  None Available. FINDINGS: RIGHT LOWER EXTREMITY Common Femoral Vein: No evidence of thrombus. Normal compressibility, respiratory phasicity and response to augmentation. Saphenofemoral Junction: No evidence of thrombus. Normal compressibility and flow on color Doppler imaging. Profunda Femoral Vein: No evidence of thrombus. Normal compressibility and flow on color Doppler imaging. Femoral Vein: No evidence of thrombus. Normal compressibility, respiratory phasicity and response to augmentation. Popliteal Vein: No evidence of thrombus. Normal compressibility, respiratory phasicity and response to augmentation. Calf Veins: The RIGHT peroneal vein is limited in visualization secondary to calf edema. No evidence of thrombus within the RIGHT posterior tibial vein. Normal compressibility and flow on color Doppler imaging. Superficial Great Saphenous Vein: No evidence of thrombus. Normal compressibility. Venous Reflux:  None. Other Findings:  None. LEFT LOWER EXTREMITY Common Femoral Vein: No evidence of thrombus. Normal compressibility, respiratory phasicity and response to augmentation. Saphenofemoral Junction: No evidence of thrombus. Normal compressibility and flow on color Doppler imaging. Profunda Femoral Vein: No evidence of thrombus. Normal compressibility and flow on color Doppler imaging. Femoral Vein: No evidence of thrombus. Normal compressibility, respiratory phasicity and response to augmentation. Popliteal Vein: No evidence of thrombus.  Normal compressibility, respiratory phasicity and response to augmentation. Calf Veins: The LEFT peroneal vein is limited in visualization secondary to calf edema no evidence of thrombus within the LEFT posterior tibial vein. Normal compressibility and flow on color Doppler imaging. Superficial Great Saphenous Vein: No evidence  of thrombus. Normal compressibility. Venous Reflux:  None. Other Findings:  None. IMPRESSION: Limited evaluation of the BILATERAL calf veins, without evidence of deep venous thrombosis in either lower extremity. Electronically Signed   By: Suzen Dials M.D.   On: 08/22/2023 16:37   DG Chest 2 View Result Date: 08/22/2023 CLINICAL DATA:  Dyspnea.  Bilateral lower extremity swelling. EXAM: CHEST - 2 VIEW COMPARISON:  03/12/2017. FINDINGS: The heart size and mediastinal contours are within normal limits. Aortic atherosclerosis. Small bilateral pleural effusions, more pronounced on the right. Bilateral interstitial prominence, likely reflects interstitial pulmonary edema. No pneumothorax. No acute osseous abnormality. IMPRESSION: 1. Small bilateral pleural effusions, more pronounced on the right. 2. Findings favored to reflect interstitial pulmonary edema. Electronically Signed   By: Harrietta Sherry M.D.   On: 08/22/2023 15:09    Assessment and Plan   Erik Martinez is a 68 y.o. 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   Patient comes into the emergency room referred by PCP after he started noticing increasing shortness of breath and bilateral lower extremity edema for one week. He was seen at PCPs office started on oral Lasix  for leg swelling. Bloodwork showed significant anemia of hemoglobin 7.3 with iron  deficiency and elevated BNP. He was asked to come to the emergency room.     Congestive heart failure acute , New onset in the setting of severe anemia -- admit to cardiac telemetry -- IV Lasix  bid--UOP not documented. Pt says he is having good UOP -- monitoring per output -- patient came in with increasing shortness of breath, elevated BNP, bilateral lower extremity edema and  chest x-ray consistent with pulmonary vascular congestion -- cardiology consultation with [facility] -- Echo of the heart pending   Iron  deficiency anemia severe -- baseline hemoglobin 10.4 to 11.4. -- Came in with hemoglobin of 7.3. Repeat 7.8 -- denies any G.I. bleed however does have history of Thalia and takes Celebrex  daily due to chronic pain --discussed with patient and agreeable for transfusion as needed- - G.I.-consultation--  Dr. Jinny plans luminal eval once cardiology clearance obtained --Continue Protonix  -will start iron  pills and give IV iron  since levels are super low --S Iron  8 micrograms/dl -- will need to follow-up with hematology as outpatient -- B12 levels 1105 -- no evidence of hematuria on urine analysis   Chronic pain syndrome narcotic dependence -- patient takes Celebrex , Flexeril , oxycodone  -- follows with pain clinic in [place]   Type II diabetes, hyperglycemia, non-insulin  requiring -- sliding scale insulin  -- patient takes metformin  and Jardiance  at home -- last A1c was 7.4 (July 2025)   History of bipolar disorder -- await med rec and if patient on any psych meds will resume it    Advance Care Planning:   Code Status: Full Code    Consults: cardiology, G.I.   Family Communication: none   Level of care: Telemetry Cardiac Status is: Inpatient Remains inpatient appropriate because: CHF, severe IDA    TOTAL TIME TAKING CARE OF THIS PATIENT: 45 minutes.  >  50% time spent on counselling and coordination of care  Note: This dictation was prepared with Dragon dictation along with smaller phrase technology. Any transcriptional errors that result from this process are unintentional.  Leita Blanch M.D    [facility]   CC: Primary care physician; [name], MD

## 2023-08-23 NOTE — Plan of Care (Signed)
  Problem: Education: Goal: Knowledge of General Education information will improve Description: Including pain rating scale, medication(s)/side effects and non-pharmacologic comfort measures Outcome: Progressing   Problem: Clinical Measurements: Goal: Ability to maintain clinical measurements within normal limits will improve Outcome: Progressing Goal: Will remain free from infection Outcome: Progressing Goal: Diagnostic test results will improve Outcome: Progressing Goal: Respiratory complications will improve Outcome: Progressing Goal: Cardiovascular complication will be avoided Outcome: Progressing   Problem: Activity: Goal: Risk for activity intolerance will decrease Outcome: Progressing   Problem: Coping: Goal: Level of anxiety will decrease Outcome: Progressing   Problem: Elimination: Goal: Will not experience complications related to bowel motility Outcome: Progressing Goal: Will not experience complications related to urinary retention Outcome: Progressing   Problem: Pain Managment: Goal: General experience of comfort will improve and/or be controlled Outcome: Progressing   Problem: Safety: Goal: Ability to remain free from injury will improve Outcome: Progressing   Problem: Skin Integrity: Goal: Risk for impaired skin integrity will decrease Outcome: Progressing   Problem: Coping: Goal: Ability to adjust to condition or change in health will improve Outcome: Progressing   Problem: Metabolic: Goal: Ability to maintain appropriate glucose levels will improve Outcome: Progressing   Problem: Skin Integrity: Goal: Risk for impaired skin integrity will decrease Outcome: Progressing   Problem: Tissue Perfusion: Goal: Adequacy of tissue perfusion will improve Outcome: Progressing

## 2023-08-24 ENCOUNTER — Inpatient Hospital Stay (HOSPITAL_COMMUNITY)
Admit: 2023-08-24 | Discharge: 2023-08-24 | Disposition: A | Discharge status: Home / Self Care | Attending: Internal Medicine | Admitting: Internal Medicine

## 2023-08-24 ENCOUNTER — Other Ambulatory Visit (HOSPITAL_COMMUNITY): Payer: Self-pay

## 2023-08-24 DIAGNOSIS — I5031 Acute diastolic (congestive) heart failure: Secondary | ICD-10-CM

## 2023-08-24 DIAGNOSIS — R0609 Other forms of dyspnea: Secondary | ICD-10-CM | POA: Diagnosis not present

## 2023-08-24 LAB — ECHOCARDIOGRAM COMPLETE
Calc EF: 43.1 %
Height: 69 in
S' Lateral: 3.6 cm
Single Plane A2C EF: 43 %
Single Plane A4C EF: 42.7 %
Weight: 2880 [oz_av]

## 2023-08-24 LAB — CBC WITH DIFFERENTIAL/PLATELET
Abs Immature Granulocytes: 0.02 K/uL (ref 0.00–0.07)
Basophils Absolute: 0.1 K/uL (ref 0.0–0.1)
Basophils Relative: 1 %
Eosinophils Absolute: 0.4 K/uL (ref 0.0–0.5)
Eosinophils Relative: 7 %
HCT: 26.4 % — ABNORMAL LOW (ref 39.0–52.0)
Hemoglobin: 8 g/dL — ABNORMAL LOW (ref 13.0–17.0)
Immature Granulocytes: 0 %
Lymphocytes Relative: 24 %
Lymphs Abs: 1.3 K/uL (ref 0.7–4.0)
MCH: 20.2 pg — ABNORMAL LOW (ref 26.0–34.0)
MCHC: 30.3 g/dL (ref 30.0–36.0)
MCV: 66.5 fL — ABNORMAL LOW (ref 80.0–100.0)
Monocytes Absolute: 0.6 K/uL (ref 0.1–1.0)
Monocytes Relative: 12 %
Neutro Abs: 3 K/uL (ref 1.7–7.7)
Neutrophils Relative %: 56 %
Platelets: 242 K/uL (ref 150–400)
RBC: 3.97 MIL/uL — ABNORMAL LOW (ref 4.22–5.81)
RDW: 20.9 % — ABNORMAL HIGH (ref 11.5–15.5)
Smear Review: NORMAL
WBC: 5.3 K/uL (ref 4.0–10.5)
nRBC: 0 % (ref 0.0–0.2)

## 2023-08-24 LAB — URINE DRUG SCREEN, QUALITATIVE (ARMC ONLY)
Amphetamines, Ur Screen: NOT DETECTED
Barbiturates, Ur Screen: NOT DETECTED
Benzodiazepine, Ur Scrn: NOT DETECTED
Cannabinoid 50 Ng, Ur ~~LOC~~: NOT DETECTED
Cocaine Metabolite,Ur ~~LOC~~: NOT DETECTED
MDMA (Ecstasy)Ur Screen: NOT DETECTED
Methadone Scn, Ur: NOT DETECTED
Opiate, Ur Screen: NOT DETECTED
Phencyclidine (PCP) Ur S: NOT DETECTED
Tricyclic, Ur Screen: POSITIVE — AB

## 2023-08-24 LAB — BASIC METABOLIC PANEL WITH GFR
Anion gap: 9 (ref 5–15)
BUN: 18 mg/dL (ref 8–23)
CO2: 30 mmol/L (ref 22–32)
Calcium: 7.9 mg/dL — ABNORMAL LOW (ref 8.9–10.3)
Chloride: 99 mmol/L (ref 98–111)
Creatinine, Ser: 1 mg/dL (ref 0.61–1.24)
GFR, Estimated: >60 mL/min (ref >60)
Glucose, Bld: 142 mg/dL — ABNORMAL HIGH (ref 70–99)
Potassium: 3.7 mmol/L (ref 3.5–5.1)
Sodium: 138 mmol/L (ref 135–145)

## 2023-08-24 LAB — GLUCOSE, CAPILLARY
Glucose-Capillary: 137 mg/dL — ABNORMAL HIGH (ref 70–99)
Glucose-Capillary: 262 mg/dL — ABNORMAL HIGH (ref 70–99)
Glucose-Capillary: 203 mg/dL — ABNORMAL HIGH (ref 70–99)
Glucose-Capillary: 177 mg/dL — ABNORMAL HIGH (ref 70–99)

## 2023-08-24 LAB — MAGNESIUM: Magnesium: 2.2 mg/dL (ref 1.7–2.4)

## 2023-08-24 MED ORDER — PEG 3350-KCL-NA BICARB-NACL 420 G PO SOLR
4000.0000 mL | Freq: Once | ORAL | Status: AC
  Administered 2023-08-24: 4000 mL via ORAL
  Filled 2023-08-24: qty 4000

## 2023-08-24 MED ORDER — POTASSIUM CHLORIDE CRYS ER 20 MEQ PO TBCR
40.0000 meq | EXTENDED_RELEASE_TABLET | Freq: Once | ORAL | Status: AC
  Administered 2023-08-24: 40 meq via ORAL
  Filled 2023-08-24: qty 2

## 2023-08-24 MED ORDER — NA SULFATE-K SULFATE-MG SULF 17.5-3.13-1.6 GM/177ML PO SOLN
0.5000 | Freq: Once | ORAL | Status: AC
  Administered 2023-08-25: 177 mL via ORAL

## 2023-08-24 MED ORDER — ONDANSETRON HCL 4 MG/2ML IJ SOLN: 4.0000 mg | Freq: Three times a day (TID) | INTRAMUSCULAR | Status: DC | PRN

## 2023-08-24 MED ORDER — ONDANSETRON HCL 4 MG/2ML IJ SOLN: 4.0000 mg | Freq: Three times a day (TID) | INTRAMUSCULAR | Status: DC

## 2023-08-24 MED ORDER — SODIUM CHLORIDE 0.9 % IV SOLN: INTRAVENOUS | Status: DC

## 2023-08-24 MED ORDER — NA SULFATE-K SULFATE-MG SULF 17.5-3.13-1.6 GM/177ML PO SOLN
0.5000 | Freq: Once | ORAL | Status: AC
  Administered 2023-08-24: 177 mL via ORAL
  Filled 2023-08-24: qty 1

## 2023-08-24 NOTE — Progress Notes (Signed)
 Acceptable risk from a cardiovascular perspective for EGD and colonoscopy Echocardiogram detailing low normal ejection fraction 50 to 55% with normal RV size and function, no indication of elevated right heart pressures, no other significant valvular heart disease - No further cardiac workup needed at this time  Signed, Velinda Lunger, MD, Ph.D [facility]

## 2023-08-24 NOTE — Hospital Course (Addendum)
 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  Hgb 7.3, iron  deficiency, and elevated BNP.  He was admitted for acute CHF in the setting of anemia.  He was started on Lasix , cardiology consulted, echo reassuring with mild diastolic dysfunction.  GI consulted, planned for endoscopy/colonoscopy 8/15.  He was started on IV iron  and Protonix .

## 2023-08-24 NOTE — Plan of Care (Signed)
   Problem: Activity: Goal: Risk for activity intolerance will decrease Outcome: Adequate for Discharge   Problem: Nutrition: Goal: Adequate nutrition will be maintained Outcome: Adequate for Discharge

## 2023-08-24 NOTE — Plan of Care (Signed)

## 2023-08-24 NOTE — Care Management Important Message (Signed)
 Important Message  Patient Details  Name: Erik Martinez MRN: [id number] Date of Birth: 1955-01-20   Important Message Given:  Yes - Medicare IM     [name] 08/24/2023, 12:07 PM

## 2023-08-24 NOTE — Plan of Care (Signed)
   Problem: Activity: Goal: Risk for activity intolerance will decrease Outcome: Adequate for Discharge

## 2023-08-24 NOTE — Progress Notes (Addendum)
 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  Hgb 7.3, iron  deficiency, and elevated BNP.  He was admitted for acute CHF in the setting of anemia.  He was started on Lasix , cardiology consulted, echo reassuring with mild diastolic dysfunction.  GI consulted, planned for endoscopy/colonoscopy 8/15.  He was started on IV iron  and Protonix .  Assessment and Plan:  Congestive heart failure acute , New onset in the setting of severe anemia Patient presented with increasing shortness of breath, elevated BNP, bilateral lower extremity edema and chest x-ray consistent with pulmonary vascular congestion Admitted to cardiac telemetry IV Lasix  bid He reports tremendous improvement today Cardiology consulting, has cleared him for procedure Echo with preserved EF, grade 1 diastolic dysfunction   Iron  deficiency anemia severe Baseline hemoglobin 10.4 to 11.4. Came in with hemoglobin of 7.3, 8.0 today Denies any G.I. bleed however does have history of Thalia and takes Celebrex  daily due to chronic pain Discussed with patient and agreeable for transfusion as needed G.I.-consultation--  Dr. Jinny plans EGD/colonoscopy on 8/15 Continue Protonix  Started iron  pills and IV iron  since levels are super low Will need to follow-up with hematology as outpatient No evidence of hematuria on urinalysis  Hypokalemia Corrected Continue to replete as needed   Chronic pain syndrome Chronic use of Celebrex , Flexeril , oxycodone , pregabalin , Ambien  Follows with pain clinic in [place] I have reviewed this patient in the [place] Controlled Substances Reporting System.  He is receiving medications from three providers  but appears to be taking them as prescribed. He is at particularly high risk of opioid misuse or diversion, but not overdose.  Will check UDS   Type II diabetes, hyperglycemia, non-insulin  requiring Sliding scale insulin  Patient takes metformin  and Jardiance  at home, Jardiance  continued Last A1c was 7.4 (July 2025), reasonable control   History of bipolar disorder No reported medications  for this condition  Overweight Patient is on semaglutide  as an outpatient  HLD Continue atorvastatin     Consultants: GI Cardiology [facility] team  Procedures: Echocardiogram  Antibiotics: None    30 Day Unplanned Readmission Risk Score    Flowsheet Row ED to Hosp-Admission (Current) from 08/22/2023 in [facility] PCU  30 Day Unplanned Readmission Risk Score (%) 16.27 Filed at 08/24/2023 0801    This score is the patient's risk of an unplanned readmission within 30 days of being discharged (0 -100%). The score is based on dignosis, age, lab data, medications, orders, and past utilization.   Low:  0-14.9   Medium: 15-21.9   High: 22-29.9   Extreme: 30 and above           Subjective: Reports feeling much better.  Edema is markedly improved.  Eager to get scops done and get discharged.   Objective: Vitals:   08/24/23 1216 08/24/23 1616  BP: 117/72 118/68  Pulse: (!) 101 96  Resp: 18   Temp: 97.9 F (36.6 C) 98.4 F (36.9 C)  SpO2: 98% 99%    Intake/Output Summary (Last 24 hours) at 08/24/2023 1702 Last data filed at 08/24/2023 1300 Gross per 24 hour  Intake 1440 ml  Output 4600 ml  Net -3160 ml   [employer]  08/22/23 1051  Weight: 81.6 kg    Exam:  General:  Appears calm and comfortable and is in NAD Eyes:  normal lids, iris ENT:  grossly normal hearing, lips & tongue, mmm Cardiovascular:  RRR. No LE edema.  Respiratory:   CTA bilaterally with no wheezes/rales/rhonchi.  Normal respiratory effort. Abdomen:  soft, NT, ND Skin:  no rash or  induration seen on limited exam; many tattoos Musculoskeletal:  grossly normal tone BUE/BLE, good ROM, no bony abnormality; prior L knee surgeries Psychiatric:  grossly normal mood and affect, speech fluent and appropriate, AOx3 Neurologic:  CN 2-12 grossly intact, moves all extremities in coordinated fashion  Data Reviewed: I have reviewed the patient's lab results since admission.  Pertinent labs for today include:   Glucose 142 WBC 5.3 Hgb 8, up from 7.7     Family Communication: Sister was present for the majority of the encounter  Disposition: Status is: Inpatient Remains inpatient appropriate because: ongoing management     Time spent: 50 minutes  Unresulted Labs (From admission, onward)     Start     Ordered   08/25/23 0500  CBC  Tomorrow morning,   R       Question:  Specimen collection method  Answer:  Lab=Lab collect   08/24/23 1702   08/25/23 0500  Basic metabolic panel with GFR  Tomorrow morning,   R       Question:  Specimen collection method  Answer:  Lab=Lab collect   08/24/23 1702   08/24/23 1659  Urine Drug Screen, Qualitative ([facility] only)  Once,   R        08/24/23 1658             Author: Delon Herald, MD 08/24/2023 5:02 PM  For on call review [email or web address].

## 2023-08-25 ENCOUNTER — Inpatient Hospital Stay

## 2023-08-25 ENCOUNTER — Inpatient Hospital Stay: Admitting: Anesthesiology

## 2023-08-25 ENCOUNTER — Encounter
Admission: EM | Disposition: A | Discharge status: Home / Self Care | Payer: Self-pay | Source: Home / Self Care | Attending: Internal Medicine

## 2023-08-25 ENCOUNTER — Encounter: Payer: Self-pay | Admitting: Internal Medicine

## 2023-08-25 DIAGNOSIS — K5669 Other partial intestinal obstruction: Secondary | ICD-10-CM

## 2023-08-25 DIAGNOSIS — I5031 Acute diastolic (congestive) heart failure: Secondary | ICD-10-CM | POA: Diagnosis not present

## 2023-08-25 DIAGNOSIS — C184 Malignant neoplasm of transverse colon: Secondary | ICD-10-CM

## 2023-08-25 DIAGNOSIS — C189 Malignant neoplasm of colon, unspecified: Secondary | ICD-10-CM

## 2023-08-25 DIAGNOSIS — D509 Iron deficiency anemia, unspecified: Secondary | ICD-10-CM | POA: Diagnosis not present

## 2023-08-25 HISTORY — PX: COLONOSCOPY: SHX5424

## 2023-08-25 HISTORY — PX: ESOPHAGOGASTRODUODENOSCOPY: SHX5428

## 2023-08-25 HISTORY — DX: Malignant neoplasm of colon, unspecified: C18.9

## 2023-08-25 LAB — BASIC METABOLIC PANEL WITH GFR
Anion gap: 9 (ref 5–15)
BUN: 19 mg/dL (ref 8–23)
CO2: 31 mmol/L (ref 22–32)
Calcium: 8.3 mg/dL — ABNORMAL LOW (ref 8.9–10.3)
Chloride: 97 mmol/L — ABNORMAL LOW (ref 98–111)
Creatinine, Ser: 0.9 mg/dL (ref 0.61–1.24)
GFR, Estimated: >60 mL/min (ref >60)
Glucose, Bld: 139 mg/dL — ABNORMAL HIGH (ref 70–99)
Potassium: 4.3 mmol/L (ref 3.5–5.1)
Sodium: 137 mmol/L (ref 135–145)

## 2023-08-25 LAB — CBC
HCT: 28.3 % — ABNORMAL LOW (ref 39.0–52.0)
Hemoglobin: 8.4 g/dL — ABNORMAL LOW (ref 13.0–17.0)
MCH: 20.1 pg — ABNORMAL LOW (ref 26.0–34.0)
MCHC: 29.7 g/dL — ABNORMAL LOW (ref 30.0–36.0)
MCV: 67.7 fL — ABNORMAL LOW (ref 80.0–100.0)
Platelets: 253 K/uL (ref 150–400)
RBC: 4.18 MIL/uL — ABNORMAL LOW (ref 4.22–5.81)
RDW: 20.8 % — ABNORMAL HIGH (ref 11.5–15.5)
WBC: 6.6 K/uL (ref 4.0–10.5)
nRBC: 0 % (ref 0.0–0.2)

## 2023-08-25 LAB — GLUCOSE, CAPILLARY: Glucose-Capillary: 282 mg/dL — ABNORMAL HIGH (ref 70–99)

## 2023-08-25 SURGERY — COLONOSCOPY
Anesthesia: General

## 2023-08-25 MED ORDER — IOHEXOL 300 MG/ML  SOLN
100.0000 mL | Freq: Once | INTRAMUSCULAR | Status: AC | PRN
  Administered 2023-08-25: 100 mL via INTRAVENOUS

## 2023-08-25 MED ORDER — PHENYLEPHRINE 80 MCG/ML (10ML) SYRINGE FOR IV PUSH (FOR BLOOD PRESSURE SUPPORT)
PREFILLED_SYRINGE | INTRAVENOUS | Status: DC | PRN
  Administered 2023-08-25: 80 ug via INTRAVENOUS

## 2023-08-25 MED ORDER — DEXMEDETOMIDINE HCL IN NACL 80 MCG/20ML IV SOLN
INTRAVENOUS | Status: AC
  Filled 2023-08-25: qty 20

## 2023-08-25 MED ORDER — PROPOFOL 10 MG/ML IV BOLUS
INTRAVENOUS | Status: AC
  Filled 2023-08-25: qty 20

## 2023-08-25 MED ORDER — POLYETHYLENE GLYCOL 3350 17 G PO PACK: 17.0000 g | PACK | Freq: Every day | ORAL | 0 refills | Status: AC | PRN

## 2023-08-25 MED ORDER — PHENYLEPHRINE 80 MCG/ML (10ML) SYRINGE FOR IV PUSH (FOR BLOOD PRESSURE SUPPORT)
PREFILLED_SYRINGE | INTRAVENOUS | Status: AC
  Filled 2023-08-25: qty 10

## 2023-08-25 MED ORDER — IOHEXOL 9 MG/ML PO SOLN: 500.0000 mL | ORAL | Status: AC

## 2023-08-25 MED ORDER — DEXMEDETOMIDINE HCL IN NACL 80 MCG/20ML IV SOLN
INTRAVENOUS | Status: DC | PRN
  Administered 2023-08-25: 8 ug via INTRAVENOUS

## 2023-08-25 MED ORDER — PROPOFOL 10 MG/ML IV BOLUS
INTRAVENOUS | Status: DC | PRN
  Administered 2023-08-25: 80 mg via INTRAVENOUS
  Administered 2023-08-25: 20 mg via INTRAVENOUS

## 2023-08-25 MED ORDER — POLYSACCHARIDE IRON COMPLEX 150 MG PO CAPS: 150.0000 mg | ORAL_CAPSULE | Freq: Every day | ORAL | 0 refills | Status: DC

## 2023-08-25 MED ORDER — PROPOFOL 1000 MG/100ML IV EMUL
INTRAVENOUS | Status: AC
  Filled 2023-08-25: qty 100

## 2023-08-25 MED ORDER — LIDOCAINE HCL (PF) 2 % IJ SOLN
INTRAMUSCULAR | Status: AC
  Filled 2023-08-25: qty 5

## 2023-08-25 MED ORDER — LIDOCAINE HCL (CARDIAC) PF 100 MG/5ML IV SOSY
PREFILLED_SYRINGE | INTRAVENOUS | Status: DC | PRN
  Administered 2023-08-25: 50 mg via INTRAVENOUS

## 2023-08-25 MED ORDER — PROPOFOL 500 MG/50ML IV EMUL
INTRAVENOUS | Status: DC | PRN
Start: 2023-08-25 — End: 2023-08-25
  Administered 2023-08-25: 100 ug/kg/min via INTRAVENOUS

## 2023-08-25 MED ORDER — SPOT INK MARKER SYRINGE KIT
PACK | SUBMUCOSAL | Status: DC | PRN
  Administered 2023-08-25: 5 mL via SUBMUCOSAL

## 2023-08-25 NOTE — Progress Notes (Signed)
 Patient was offered spiritual consult by this RN after receiving colonoscopy results. Patient declined.

## 2023-08-25 NOTE — Transfer of Care (Signed)
 Immediate Anesthesia Transfer of Care Note  Patient: Erik Martinez  Procedure(s) Performed: COLONOSCOPY EGD (ESOPHAGOGASTRODUODENOSCOPY)  Patient Location: PACU and Endoscopy Unit  Anesthesia Type:General  Level of Consciousness: awake, alert , and oriented  Airway & Oxygen Therapy: Patient Spontanous Breathing  Post-op Assessment: Report given to RN and Post -op Vital signs reviewed and stable  Post vital signs: Reviewed and stable  Last Vitals:  Vitals Value Taken Time  BP    Temp    Pulse 88 08/25/23 09:00  Resp 19 08/25/23 09:00  SpO2 100 % 08/25/23 09:00  Vitals shown include unfiled device data.  Last Pain:  Vitals:   08/25/23 0807  TempSrc: Temporal  PainSc: 0-No pain      Patients Stated Pain Goal: 0 (08/24/23 2221)  Complications: No notable events documented.

## 2023-08-25 NOTE — Plan of Care (Signed)
  Problem: Education: Goal: Knowledge of General Education information will improve Description: Including pain rating scale, medication(s)/side effects and non-pharmacologic comfort measures Outcome: Progressing   Problem: Health Behavior/Discharge Planning: Goal: Ability to manage health-related needs will improve Outcome: Progressing   Problem: Clinical Measurements: Goal: Ability to maintain clinical measurements within normal limits will improve Outcome: Progressing Goal: Will remain free from infection Outcome: Progressing Goal: Diagnostic test results will improve Outcome: Progressing Goal: Respiratory complications will improve Outcome: Progressing Goal: Cardiovascular complication will be avoided Outcome: Progressing   Problem: Activity: Goal: Risk for activity intolerance will decrease Outcome: Progressing   Problem: Nutrition: Goal: Adequate nutrition will be maintained Outcome: Progressing   Problem: Coping: Goal: Level of anxiety will decrease Outcome: Progressing   Problem: Elimination: Goal: Will not experience complications related to urinary retention Outcome: Progressing   Problem: Pain Managment: Goal: General experience of comfort will improve and/or be controlled Outcome: Progressing   Problem: Safety: Goal: Ability to remain free from injury will improve Outcome: Progressing   Problem: Skin Integrity: Goal: Risk for impaired skin integrity will decrease Outcome: Progressing   Problem: Education: Goal: Ability to describe self-care measures that may prevent or decrease complications (Diabetes Survival Skills Education) will improve Outcome: Progressing Goal: Individualized Educational Video(s) Outcome: Progressing   Problem: Coping: Goal: Ability to adjust to condition or change in health will improve Outcome: Progressing   Problem: Fluid Volume: Goal: Ability to maintain a balanced intake and output will improve Outcome: Progressing    Problem: Health Behavior/Discharge Planning: Goal: Ability to identify and utilize available resources and services will improve Outcome: Progressing Goal: Ability to manage health-related needs will improve Outcome: Progressing   Problem: Metabolic: Goal: Ability to maintain appropriate glucose levels will improve Outcome: Progressing   Problem: Nutritional: Goal: Maintenance of adequate nutrition will improve Outcome: Progressing Goal: Progress toward achieving an optimal weight will improve Outcome: Progressing   Problem: Skin Integrity: Goal: Risk for impaired skin integrity will decrease Outcome: Progressing   Problem: Tissue Perfusion: Goal: Adequacy of tissue perfusion will improve Outcome: Progressing   Problem: Elimination: Goal: Will not experience complications related to bowel motility Outcome: Not Progressing Note: See colonoscopy results; pt to f/u outpt

## 2023-08-25 NOTE — Progress Notes (Signed)
 GI doctor notified of stool appearance after bowel prep. Patient having type 7 watery muddy brown stools.

## 2023-08-25 NOTE — Op Note (Signed)
 [facility] Patient Name: Erik Martinez Procedure Date: 08/25/2023 8:27 AM MRN: [id number] Account #: [id number] Date of Birth: 1955-09-05 Admit Type: Inpatient Age: 68 Room: [facility] ENDO ROOM 2 Gender: Male Note Status: Finalized Instrument Name: Colon Scope [id number] Procedure:             Colonoscopy Indications:           Iron  deficiency anemia Providers:             Rogelia Copping MD, MD Medicines:             Propofol  per Anesthesia Complications:         No immediate complications. Procedure:             Pre-Anesthesia Assessment:                        - Prior to the procedure, a History and Physical was                         performed, and patient medications and allergies were                         reviewed. The patient's tolerance of previous                         anesthesia was also reviewed. The risks and benefits                         of the procedure and the sedation options and risks                         were discussed with the patient. All questions were                         answered, and informed consent was obtained. Prior                         Anticoagulants: The patient has taken no anticoagulant                         or antiplatelet agents. ASA Grade Assessment: II - A                         patient with mild systemic disease. After reviewing                         the risks and benefits, the patient was deemed in                         satisfactory condition to undergo the procedure.                        After obtaining informed consent, the colonoscope was                         passed under direct vision. Throughout the procedure,                         the patient's blood pressure,  pulse, and oxygen                         saturations were monitored continuously. The                         Colonoscope was introduced through the anus with the                         intention of advancing to the cecum.  The scope was                         advanced to the transverse colon before the procedure                         was aborted. Medications were given. The colonoscopy                         was performed without difficulty. The patient                         tolerated the procedure well. The quality of the bowel                         preparation was not adequate to identify polyps                         greater than 5 mm in size. Findings:      The perianal and digital rectal examinations were normal.      A partially obstructing large mass was found in the transverse colon.       The mass was circumferential. No bleeding was present. This was biopsied       with a cold forceps for histology. Area was tattooed with an injection       of 5 mL of Spot (carbon black). 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 Biopsied. Tattooed. Recommendation:        - Return patient to hospital ward for ongoing care.                        - Resume previous diet.                        - Continue present medications.                        - Await pathology results.                        - Refer to a Careers adviser. Procedure Code(s):     --- Professional ---                        [phone], 52, Colonoscopy, flexible; with biopsy, single  or multiple                        [id number], 52, Colonoscopy, flexible; with directed                         submucosal injection(s), any substance Diagnosis Code(s):     --- Professional ---                        D50.9, Iron  deficiency anemia, unspecified                        D49.0, Neoplasm of unspecified behavior of digestive                         system CPT copyright 2022 [employer]. All rights reserved. The codes documented in this report are preliminary and upon coder review may  be revised to meet current compliance requirements. Rogelia Copping  MD, MD 08/25/2023 9:02:06 AM This report has been signed electronically. Number of Addenda: 0 Note Initiated On: 08/25/2023 8:27 AM Total Procedure Duration: 0 hours 9 minutes 28 seconds  Estimated Blood Loss:  Estimated blood loss: none.      [facility]

## 2023-08-25 NOTE — Op Note (Signed)
 [facility] Patient Name: Erik Martinez Procedure Date: 08/25/2023 8:27 AM MRN: [id number] Account #: [id number] Date of Birth: 25-Aug-1955 Admit Type: Outpatient Age: 68 Room: [facility] ENDO ROOM 2 Gender: Male Note Status: Finalized Instrument Name: Upper GI Scope [id number] Procedure:             Upper GI endoscopy Indications:           Iron  deficiency anemia Providers:             Rogelia Copping MD, MD Medicines:             Propofol  per Anesthesia Complications:         No immediate complications. Procedure:             Pre-Anesthesia Assessment:                        - Prior to the procedure, a History and Physical was                         performed, and patient medications and allergies were                         reviewed. The patient's tolerance of previous                         anesthesia was also reviewed. The risks and benefits                         of the procedure and the sedation options and risks                         were discussed with the patient. All questions were                         answered, and informed consent was obtained. Prior                         Anticoagulants: The patient has taken no anticoagulant                         or antiplatelet agents. ASA Grade Assessment: II - A                         patient with mild systemic disease. After reviewing                         the risks and benefits, the patient was deemed in                         satisfactory condition to undergo the procedure.                        After obtaining informed consent, the endoscope was                         passed under direct vision. Throughout the procedure,                         the  patient's blood pressure, pulse, and oxygen                         saturations were monitored continuously. The Endoscope                         was introduced through the mouth, and advanced to the                         second part of  duodenum. The upper GI endoscopy was                         accomplished without difficulty. The patient tolerated                         the procedure well. Findings:      The examined esophagus was normal.      The stomach was normal.      The examined duodenum was normal. Impression:            - Normal esophagus.                        - Normal stomach.                        - Normal examined duodenum.                        - No specimens collected. Recommendation:        - Return patient to hospital ward for ongoing care.                        - Resume previous diet.                        - Continue present medications.                        - Perform a colonoscopy today. Procedure Code(s):     --- Professional ---                        [phone], Esophagogastroduodenoscopy, flexible,                         transoral; diagnostic, including collection of                         specimen(s) by brushing or washing, when performed                         (separate procedure) Diagnosis Code(s):     --- Professional ---                        D50.9, Iron  deficiency anemia, unspecified CPT copyright 2022 American [employer] Association. All rights reserved. The codes documented in this report are preliminary and upon coder review may  be revised to meet current compliance requirements. Rogelia Copping MD, MD 08/25/2023 8:37:08 AM This report has been signed electronically. Number of Addenda: 0 Note Initiated On: 08/25/2023 8:27 AM Estimated Blood Loss:  Estimated  blood loss: none.      [facility]

## 2023-08-25 NOTE — Discharge Summary (Addendum)
 Physician Discharge Summary   Patient: Erik Martinez MRN: [id number] DOB: [birth year]  Admit date:     08/22/2023  Discharge date: 08/25/23  Discharge Physician: Delon Herald   PCP: [name], MD   Recommendations at discharge:   You have new diastolic congestive heart failure; follow up with [facility] cardiology on 8/22 as scheduled You also have a new colon mass that has been causing anemia; follow up with Dr. Tye next week to schedule surgery and to review your CT scans Continue iron  supplementation Hold Celebrex , Semaglutide  for now Follow up with Dr. 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  Hgb 7.3, iron  deficiency, and elevated BNP.  He was admitted for acute CHF in the setting of anemia.  He was started on Lasix , cardiology consulted, echo reassuring with mild diastolic dysfunction.  GI consulted, planned for endoscopy/colonoscopy 8/15.  He was started on IV iron  and Protonix .  Assessment and Plan:  Congestive heart failure acute diastolic , New onset in the setting of severe anemia Patient presented with increasing shortness of breath, elevated BNP, bilateral lower extremity edema and chest x-ray consistent with pulmonary vascular congestion Admitted to cardiac telemetry IV Lasix  bid He reports tremendous improvement  Cardiology consulted, cleared him for procedure Echo with preserved EF, grade 1 diastolic dysfunction Continue  Lipitor, Lasix    Iron  deficiency anemia severe Baseline hemoglobin 10.4 to 11.4. Came in with hemoglobin of 7.3, 8.0 today Denies any G.I. bleed however does have history of Thalia and takes Celebrex  daily due to chronic pain Discussed with patient and agreeable for transfusion as needed G.I.-consultation--  Dr. Jinny plans EGD/colonoscopy on 8/15 Continue Protonix  Started iron  pills and IV iron  with improvement Will need to follow-up with hematology as outpatient   Colon mass Normal EGD 8/15 Colonoscopy with a partially obstructing large mass in the transverse colon, likely malignant, biopsied Surgery is consulting CT C/A/P ordered and completed Patient is requesting discharge today; Dr. Tye agrees and will f/u as outpatient   Chronic pain syndrome Chronic use of Celebrex , Flexeril , oxycodone , pregabalin , Ambien  Follows with pain clinic in [place] I have reviewed this patient in the [place] Controlled Substances Reporting System.  He is receiving medications from three providers but appears to be taking them as prescribed. He is at particularly high risk of opioid misuse or diversion, but not overdose.    Type II diabetes, hyperglycemia, non-insulin  requiring Sliding scale insulin  Patient takes metformin  and Jardiance  at home, will resume Last A1c was 7.4 (July 2025), reasonable control   History of bipolar disorder No reported medications  for this condition   Overweight Patient is on semaglutide  as an outpatient, recommend holding for now   HLD Continue atorvastatin        Consultants: GI Cardiology Surgery [facility] team   Procedures: Echocardiogram   Antibiotics: None   Pain control - [place]  Controlled Substance Reporting System database was reviewed. and patient was instructed, not to drive, operate heavy machinery,  perform activities at heights, swimming or participation in water  activities or provide baby-sitting services while on Pain, Sleep and Anxiety  Medications; until their outpatient Physician has advised to do so again. Also recommended to not to take more than prescribed Pain, Sleep and Anxiety Medications.   Disposition: Home Diet recommendation:  Cardiac diet DISCHARGE MEDICATION: Allergies as of 08/25/2023       Reactions   Suboxone [buprenorphine Hcl-naloxone  Hcl] Nausea And Vomiting   Tape Other (See Comments)   Whelps *Paper Tape is ok    Codeine Rash, Itching, Nausea Only   Morphine  And Codeine Itching, Rash   Other Rash   Telemetry electrodes   Silicone Rash   Whelps -*Paper Tape is ok         Medication List     PAUSE taking these medications    celecoxib  200 MG capsule Wait to take this until your doctor or other care provider tells you to start again. Commonly known as: CELEBREX  Take 1 capsule (200 mg total) by mouth 2 (two) times daily.   Ozempic  (2 MG/DOSE) 8 MG/3ML Sopn Wait to take this until your doctor or other care provider tells you to start again. Generic drug: Semaglutide  (2 MG/DOSE) Inject 2 mg into the skin once a week. Monday       TAKE these medications    atorvastatin  20 MG tablet Commonly known as: LIPITOR Take 20 mg by mouth at bedtime.   cyclobenzaprine  10 MG tablet Commonly known as: FLEXERIL  3 (three) times daily.   empagliflozin  25 MG Tabs tablet Commonly known as: JARDIANCE  Take 1 tablet (25 mg total) by mouth daily.   FreeStyle Libre 3 Plus Sensor Misc CHANCE SENSOR EVERY 15 DAYS   furosemide  20 MG tablet Commonly known as: LASIX  Take 1 tablet (20 mg total) by mouth daily.   insulin  aspart 100 UNIT/ML injection Commonly known as: novoLOG  Per sliding scale  MAX 15 units in 24 hour period.   iron  polysaccharides 150 MG capsule Commonly known as: NIFEREX Take 1 capsule (150 mg total) by mouth daily. Start taking on: August 26, 2023   lidocaine  5 % Commonly known as: Lidoderm  Place 3 patches onto the skin daily. Remove & Discard patch within 12 hours or as  directed by MD   lubiprostone  8 MCG capsule Commonly known as: AMITIZA  Take 1 capsule (8 mcg total) by mouth 2 (two) times daily with a meal. HAS failed mulitple OTC treatment for constipaiton   metFORMIN  1000 MG tablet Commonly known as: GLUCOPHAGE  Take 1,000 mg by mouth 2 (two) times daily.   ondansetron  4 MG tablet Commonly known as: Zofran  Take 1 tablet (4 mg total) by mouth every 8 (eight) hours as needed for nausea or vomiting.   Oxycodone  HCl 20 MG Tabs Take 1 tablet by mouth every 6 (six) hours as needed.   pantoprazole  40 MG tablet Commonly known as: Protonix  Take 1 tablet (40 mg total) by mouth daily.   polyethylene glycol 17 g packet Commonly known as: MIRALAX  / GLYCOLAX  Take 17 g by mouth daily as needed for mild constipation.   pregabalin  200 MG capsule Commonly known as: Lyrica  Take 1 capsule (200 mg total) by mouth in the morning, at noon, and at bedtime.   tadalafil 20 MG tablet Commonly known as: CIALIS Take 20 mg by mouth daily as needed for erectile dysfunction.   terbinafine  250 MG tablet Commonly known as: LAMISIL  Take 1 tablet (250 mg total) by mouth daily.   zolpidem  10  MG tablet Commonly known as: AMBIEN  Take 1 tablet (10 mg total) by mouth at bedtime as needed. for sleep        Follow-up Information     [facility]. Go on 09/01/2023.   Specialty: Cardiology Why: Hospital Follow-Up 09/01/23 @ 2:30 PM Please bring all medications to follow-up appointment [facility], [address] Floor Free Valet Parking at the door Contact information: [address] [place]  [address] [phone]               Discharge Exam:   Subjective: Eager to go home.  Aware that I have colon cancer - reports that Dr. Jinny told him this.  Would like to have surgery as an outpatient.   Objective: Vitals:   08/25/23 1116 08/25/23 1510  BP: 104/89 (!) 105/51   Pulse: (!) 103 99  Resp: 18 18  Temp: 98.4 F (36.9 C) 98.4 F (36.9 C)  SpO2: 100% 100%    Intake/Output Summary (Last 24 hours) at 08/25/2023 1545 Last data filed at 08/25/2023 1500 Gross per 24 hour  Intake 820 ml  Output 2000 ml  Net -1180 ml   Filed Weights   08/22/23 1051  Weight: 81.6 kg    Exam:  General:  Appears calm and comfortable and is in NAD Eyes:  normal lids, iris ENT:  grossly normal hearing, lips & tongue, mmm Cardiovascular:  RRR. No LE edema.  Respiratory:   CTA bilaterally with no wheezes/rales/rhonchi.  Normal respiratory effort. Abdomen:  soft, NT, ND Skin:  no rash or induration seen on limited exam; many tattoos Musculoskeletal:  grossly normal tone BUE/BLE, good ROM, no bony abnormality; prior L knee surgeries Psychiatric:  grossly normal mood and affect, speech fluent and appropriate, AOx3 Neurologic:  CN 2-12 grossly intact, moves all extremities in coordinated fashion  Data Reviewed: I have reviewed the patient's lab results since admission.  Pertinent labs for today include:  Stable BMP WBC 6.6 Hgb 8.4 UDS + TCA      Condition at discharge: fair  The results of significant diagnostics from this hospitalization (including imaging, microbiology, ancillary and laboratory) are listed below for reference.   Imaging Studies: ECHOCARDIOGRAM COMPLETE Result Date: 08/24/2023    ECHOCARDIOGRAM REPORT   Patient Name:   TRACEY STEWART Lambert Date of Exam: 08/24/2023 Medical Rec #:  [id number]      Height:       69.0 in Accession #:    [id number]     Weight:       180.0 lb Date of Birth:  March 12, 1955     BSA:          1.976 m Patient Age:    67 years       BP:           117/72 mmHg Patient Gender: M              HR:           99 bpm. Exam Location:  [facility] Procedure: Cardiac Doppler, Color Doppler and 2D Echo (Both Spectral and Color            Flow Doppler were utilized during procedure). Indications:     Dyspnea  History:         Patient has no prior history  of Echocardiogram examinations.                  Risk Factors:Diabetes and Dyslipidemia.  Sonographer:     Philomena Daring Referring Phys:  [id number] SONA PATEL Diagnosing Phys: Evalene Lunger MD IMPRESSIONS  1. Left ventricular ejection fraction, by estimation, is 50 to 55%. The left ventricle has low normal function. The left ventricle has no regional wall motion abnormalities. Left ventricular diastolic parameters are consistent with Grade I diastolic dysfunction (impaired relaxation).  2. Right ventricular systolic function is normal. The right ventricular size is normal. There is normal pulmonary artery systolic pressure. The estimated right ventricular systolic pressure is 12.4 mmHg.  3. Left atrial size was mildly dilated.  4. The mitral valve is normal in structure. Mild to moderate mitral valve regurgitation. No evidence of mitral stenosis.  5. The aortic valve is normal in structure. There is mild calcification of the aortic valve. Aortic valve regurgitation is not visualized. Aortic valve sclerosis/calcification is present, without any evidence of aortic stenosis.  6. The inferior vena cava is normal in size with greater than 50% respiratory variability, suggesting right atrial pressure of 3 mmHg. FINDINGS  Left Ventricle: Left ventricular ejection fraction, by estimation, is 50 to 55%. The left ventricle has low normal function. The left ventricle has no regional wall motion abnormalities. Strain was performed and the global longitudinal strain is indeterminate. The left ventricular internal cavity size was normal in size. There is no left ventricular hypertrophy. Left ventricular diastolic parameters are consistent with Grade I diastolic dysfunction (impaired relaxation). Right Ventricle: The right ventricular size is normal. No increase in right ventricular wall thickness. Right ventricular systolic function is normal. There is normal pulmonary artery systolic pressure. The tricuspid regurgitant velocity is  1.36 m/s, and  with an assumed right atrial pressure of 5 mmHg, the estimated right ventricular systolic pressure is 12.4 mmHg. Left Atrium: Left atrial size was mildly dilated. Right Atrium: Right atrial size was normal in size. Pericardium: There is no evidence of pericardial effusion. Mitral Valve: The mitral valve is normal in structure. Mild mitral annular calcification. Mild to moderate mitral valve regurgitation. No evidence of mitral valve stenosis. Tricuspid Valve: The tricuspid valve is normal in structure. Tricuspid valve regurgitation is not demonstrated. No evidence of tricuspid stenosis. Aortic Valve: The aortic valve is normal in structure. There is mild calcification of the aortic valve. Aortic valve regurgitation is not visualized. Aortic valve sclerosis/calcification is present, without any evidence of aortic stenosis. Pulmonic Valve: The pulmonic valve was normal in structure. Pulmonic valve regurgitation is not visualized. No evidence of pulmonic stenosis. Aorta: The aortic root is normal in size and structure. Venous: The inferior vena cava is normal in size with greater than 50% respiratory variability, suggesting right atrial pressure of 3 mmHg. IAS/Shunts: No atrial level shunt detected by color flow Doppler. Additional Comments: 3D was performed not requiring image post processing on an independent workstation and was indeterminate.  LEFT VENTRICLE PLAX 2D LVIDd:         5.10 cm      Diastology LVIDs:         3.60 cm      LV e' lateral: 9.03 cm/s LV PW:         1.20 cm LV IVS:        1.00 cm LVOT diam:     2.20 cm LV SV:         63 LV SV Index:   32 LVOT Area:     3.80 cm  LV Volumes (MOD) LV vol d, MOD A2C: 123.0 ml LV vol d, MOD A4C: 172.0 ml  LV vol s, MOD A2C: 70.1 ml LV vol s, MOD A4C: 98.6 ml LV SV MOD A2C:     52.9 ml LV SV MOD A4C:     172.0 ml LV SV MOD BP:      63.8 ml RIGHT VENTRICLE            IVC RV S prime:     8.49 cm/s  IVC diam: 1.70 cm TAPSE (M-mode): 1.8 cm LEFT ATRIUM              Index        RIGHT ATRIUM           Index LA diam:        4.40 cm 2.23 cm/m   RA Area:     18.60 cm LA Vol (A2C):   69.4 ml 35.13 ml/m  RA Volume:   49.60 ml  25.11 ml/m LA Vol (A4C):   59.2 ml 29.97 ml/m LA Biplane Vol: 64.1 ml 32.45 ml/m  AORTIC VALVE LVOT Vmax:   93.30 cm/s LVOT Vmean:  66.000 cm/s LVOT VTI:    0.165 m  AORTA Ao Root diam: 2.50 cm TRICUSPID VALVE TR Peak grad:   7.4 mmHg TR Vmax:        136.00 cm/s  SHUNTS Systemic VTI:  0.16 m Systemic Diam: 2.20 cm Evalene Lunger MD Electronically signed by Evalene Lunger MD Signature Date/Time: 08/24/2023/4:35:26 PM    Final    US  Venous Img Lower Bilateral Result Date: 08/22/2023 CLINICAL DATA:  Leg edema x5 days with shortness of breath. EXAM: BILATERAL LOWER EXTREMITY VENOUS DOPPLER ULTRASOUND TECHNIQUE: Gray-scale sonography with graded compression, as well as color Doppler and duplex ultrasound were performed to evaluate the lower extremity deep venous systems from the level of the common femoral vein and including the common femoral, femoral, profunda femoral, popliteal and calf veins including the posterior tibial, peroneal and gastrocnemius veins when visible. The superficial great saphenous vein was also interrogated. Spectral Doppler was utilized to evaluate flow at rest and with distal augmentation maneuvers in the common femoral, femoral and popliteal veins. COMPARISON:  None Available. FINDINGS: RIGHT LOWER EXTREMITY Common Femoral Vein: No evidence of thrombus. Normal compressibility, respiratory phasicity and response to augmentation. Saphenofemoral Junction: No evidence of thrombus. Normal compressibility and flow on color Doppler imaging. Profunda Femoral Vein: No evidence of thrombus. Normal compressibility and flow on color Doppler imaging. Femoral Vein: No evidence of thrombus. Normal compressibility, respiratory phasicity and response to augmentation. Popliteal Vein: No evidence of thrombus. Normal compressibility,  respiratory phasicity and response to augmentation. Calf Veins: The RIGHT peroneal vein is limited in visualization secondary to calf edema. No evidence of thrombus within the RIGHT posterior tibial vein. Normal compressibility and flow on color Doppler imaging. Superficial Great Saphenous Vein: No evidence of thrombus. Normal compressibility. Venous Reflux:  None. Other Findings:  None. LEFT LOWER EXTREMITY Common Femoral Vein: No evidence of thrombus. Normal compressibility, respiratory phasicity and response to augmentation. Saphenofemoral Junction: No evidence of thrombus. Normal compressibility and flow on color Doppler imaging. Profunda Femoral Vein: No evidence of thrombus. Normal compressibility and flow on color Doppler imaging. Femoral Vein: No evidence of thrombus. Normal compressibility, respiratory phasicity and response to augmentation. Popliteal Vein: No evidence of thrombus. Normal compressibility, respiratory phasicity and response to augmentation. Calf Veins: The LEFT peroneal vein is limited in visualization secondary to calf edema no evidence of thrombus within the LEFT posterior tibial vein. Normal compressibility and flow on color Doppler imaging. Superficial Great  Saphenous Vein: No evidence of thrombus. Normal compressibility. Venous Reflux:  None. Other Findings:  None. IMPRESSION: Limited evaluation of the BILATERAL calf veins, without evidence of deep venous thrombosis in either lower extremity. Electronically Signed   By: Suzen Dials M.D.   On: 08/22/2023 16:37   DG Chest 2 View Result Date: 08/22/2023 CLINICAL DATA:  Dyspnea.  Bilateral lower extremity swelling. EXAM: CHEST - 2 VIEW COMPARISON:  03/12/2017. FINDINGS: The heart size and mediastinal contours are within normal limits. Aortic atherosclerosis. Small bilateral pleural effusions, more pronounced on the right. Bilateral interstitial prominence, likely reflects interstitial pulmonary edema. No pneumothorax. No acute  osseous abnormality. IMPRESSION: 1. Small bilateral pleural effusions, more pronounced on the right. 2. Findings favored to reflect interstitial pulmonary edema. Electronically Signed   By: Harrietta Sherry M.D.   On: 08/22/2023 15:09    Microbiology: Results for orders placed or performed in visit on 08/18/23  Urine Culture     Status: None   Collection Time: 08/18/23  5:04 PM   Specimen: Urine  Result Value Ref Range Status   MICRO NUMBER: [id number]  Final   SPECIMEN QUALITY: Adequate  Final   Sample Source URINE  Final   STATUS: FINAL  Final   Result: No Growth  Final    Labs: CBC: Recent Labs  Lab 08/21/23 1016 08/22/23 1053 08/23/23 0400 08/24/23 0853 08/25/23 0308  WBC 4.5 5.1 5.7 5.3 6.6  NEUTROABS 3.5  --   --  3.0  --   HGB 7.3 Repeated and verified X2.* 7.8* 7.7* 8.0* 8.4*  HCT 23.6 Repeated and verified X2.* 25.8* 25.5* 26.4* 28.3*  MCV 64.1* 66.7* 65.6* 66.5* 67.7*  PLT 189.0 226 218 242 253   Basic Metabolic Panel: Recent Labs  Lab 08/21/23 1016 08/22/23 1053 08/22/23 1321 08/23/23 0400 08/24/23 0853 08/25/23 0308  NA 133* 134*  --  138 138 137  K 3.7 3.3*  --  3.1* 3.7 4.3  CL 100 99  --  99 99 97*  CO2 24 24  --  26 30 31   GLUCOSE 231* 135*  --  145* 142* 139*  BUN 18 14  --  13 18 19   CREATININE 1.01 0.94  --  1.00 1.00 0.90  CALCIUM  7.5* 8.0*  --  8.0* 7.9* 8.3*  MG  --   --  2.0  --  2.2  --    Liver Function Tests: Recent Labs  Lab 08/22/23 1053  AST 108*  ALT 485*  ALKPHOS 95  BILITOT 1.0  PROT 6.4*  ALBUMIN 2.7*   CBG: Recent Labs  Lab 08/24/23 0904 08/24/23 1218 08/24/23 1618 08/24/23 2232 08/25/23 1246  GLUCAP 137* 262* 203* 177* 282*    Discharge time spent: greater than 30 minutes.  Signed: Delon Herald, MD [facility] 08/25/2023

## 2023-08-25 NOTE — Consult Note (Signed)
 Subjective:   CC: Transverse colon mass  HPI:  Erik Martinez is a 68 y.o. male who is consulted by [facility] for evaluation of above cc.    Patient admitted for weakness and shortness of breath.  Noted to have some pleural effusion and anemia.  Anemia workup included recent colonoscopy which showed a partially obstructing transverse colon mass per report.  Surgery consulted for further evaluation of this mass.  Currently pending biopsy path report.  Patient otherwise denies any symptoms including any recent history of GI bleed that he has noticed.     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  Past Surgical History:  has a past surgical history that includes Total knee arthroplasty (Left, 06/2002); Total knee revision (Left, 2008); OPEN DEBRIDEMENT LEFT TOTAL KNEE (SCAR, BONEY GRAOWTH)/ REMOVAL OLD SUTURES (05/01/2007); Interstim Implant placement (2007); Partial hip arthroplasty (Left, 2001); Total hip arthroplasty (Left, 2002); Shoulder open rotator cuff repair (Left, 2000); Trigger finger release (Bilateral); Lumbar disc surgery (2005); Lumbar fusion (X2  2006  &  2007); Interstim implant revision (N/A, 08/17/2012); Back surgery; Trigger finger release (Right, 06/18/2014); Lower Extremity Angiography  (Left, 11/17/2020); Lower Extremity Angiography (Right, 12/22/2020); Total knee revision (Left, 07/19/2021); Irrigation and debridement knee (Left, 09/11/2021); Application if wound vac (Left, 09/11/2021); Lower Extremity Angiography (Left, 12/14/2021); Manipulation knee joint (Left, 08/2002); Total knee revision (Left, 2005); Colonoscopy (2008); Total knee revision (Left, 06/01/2022); Irrigation and debridement knee (Left, 06/01/2022); and Total knee revision (Left, 04/01/2022).  Family History: family history includes Alcohol abuse in his father; Arthritis in his father and mother; Cancer in his father and mother; Diabetes in his brother and mother; Hyperlipidemia in his mother; Stroke in his mother.  Social History:  reports that he has never smoked. He has never used smokeless tobacco. He reports that he does not currently use alcohol. He reports that he does not currently use drugs after having used the following drugs: Cocaine.  Current Medications:  Prior to Admission medications   Medication Sig Start Date End Date Taking? Authorizing Provider  atorvastatin  (LIPITOR) 20 MG tablet Take 20 mg by mouth at bedtime.   Yes [provider]  celecoxib  (CELEBREX ) 200 MG capsule Take 1 capsule (200 mg total) by mouth 2 (two) times daily. 07/07/23  Yes Bedsole, Amy E, MD  cyclobenzaprine  (FLEXERIL ) 10 MG tablet 3 (three) times daily. 05/04/21  Yes [provider]  empagliflozin  (JARDIANCE ) 25 MG TABS tablet Take 1 tablet (25 mg total) by mouth daily. 01/20/21  Yes [name], Rosaline GORMAN, FNP  furosemide  (LASIX ) 20 MG tablet Take 1 tablet (20 mg total) by mouth daily. 08/18/23  Yes Bedsole, Amy E, MD  insulin  aspart (NOVOLOG ) 100 UNIT/ML injection Per sliding scale  MAX 15 units in 24 hour period. 10/27/22  Yes Bedsole, Amy E, MD  lidocaine  (LIDODERM ) 5 % Place 3 patches onto the skin daily. Remove & Discard patch within 12 hours or as directed by MD 07/18/23  Yes [name], MD  lubiprostone   (  AMITIZA ) 8 MCG capsule Take 1 capsule (8 mcg total) by mouth 2 (two) times daily with a meal. HAS failed mulitple OTC treatment for constipaiton 08/18/23  Yes Bedsole, Amy E, MD  metFORMIN  (GLUCOPHAGE ) 1000 MG tablet Take 1,000 mg by mouth 2 (two) times daily. 06/19/20  Yes [provider]  ondansetron  (ZOFRAN ) 4 MG tablet Take 1 tablet (4 mg total) by mouth every 8 (eight) hours as needed for nausea or vomiting. 01/18/22  Yes Fayette Bodily, MD  Oxycodone  HCl 20 MG TABS Take 1 tablet by mouth every 6 (six) hours as needed.   Yes [provider]  pantoprazole  (PROTONIX ) 40 MG tablet Take 1 tablet (40 mg total) by mouth daily. 11/17/20 08/22/23 Yes Schnier, Cordella MATSU, MD  pregabalin  (LYRICA ) 200 MG capsule Take 1 capsule (200 mg total) by mouth in the morning, at noon, and at bedtime. 11/24/21  Yes Bedsole, Amy E, MD  Semaglutide , 2 MG/DOSE, (OZEMPIC , 2 MG/DOSE,) 8 MG/3ML SOPN Inject 2 mg into the skin once a week. Monday   Yes [provider]  tadalafil (CIALIS) 20 MG tablet Take 20 mg by mouth daily as needed for erectile dysfunction.   Yes [provider]  terbinafine  (LAMISIL ) 250 MG tablet Take 1 tablet (250 mg total) by mouth daily. 06/01/23  Yes Janit Thresa HERO, DPM  zolpidem  (AMBIEN ) 10 MG tablet Take 1 tablet (10 mg total) by mouth at bedtime as needed. for sleep 07/18/23  Yes Bedsole, Amy E, MD  Continuous Glucose Sensor (FREESTYLE LIBRE 3 PLUS SENSOR) MISC CHANCE SENSOR EVERY 15 DAYS 08/02/23   [name], MD    Allergies:  Allergies as of 08/22/2023 - Review Complete 08/22/2023  Allergen Reaction Noted   Suboxone [buprenorphine hcl-naloxone  hcl] Nausea And Vomiting 07/15/2016   Tape Other (See Comments) 10/11/2016   Codeine Rash, Itching, and Nausea Only 08/10/2012   Morphine  and codeine Itching and Rash 08/10/2012   Other Rash 07/15/2016   Silicone Rash 06/06/2013    ROS:  Pertinent positives and negatives noted in HPI   Objective:     BP  104/89 (BP Location: Right Arm)   Pulse (!) 103   Temp 98.4 F (36.9 C)   Resp 18   Ht 5' 9 (1.753 m)   Wt 81.6 kg   SpO2 100%   BMI 26.58 kg/m    Constitutional :  alert, cooperative, appears stated age, and no distress  Respiratory:  Clear to auscultation bilaterally  Cardiovascular:  Regular rate and rhythm  Gastrointestinal: soft, non-tender; bowel sounds normal; no masses,  no organomegaly.   Skin: Cool and moist  Psychiatric: Normal affect, non-agitated, not confused       LABS:     Latest Ref Rng & Units 08/25/2023    3:08 AM 08/24/2023    8:53 AM 08/23/2023    4:00 AM  CMP  Glucose 70 - 99 mg/dL 860  857  854   BUN 8 - 23 mg/dL 19  18  13    Creatinine 0.61 - 1.24 mg/dL 9.09  8.99  8.99   Sodium 135 - 145 mmol/L 137  138  138   Potassium 3.5 - 5.1 mmol/L 4.3  3.7  3.1   Chloride 98 - 111 mmol/L 97  99  99   CO2 22 - 32 mmol/L 31  30  26    Calcium  8.9 - 10.3 mg/dL 8.3  7.9  8.0       Latest Ref Rng & Units 08/25/2023  3:08 AM 08/24/2023    8:53 AM 08/23/2023    4:00 AM  CBC  WBC 4.0 - 10.5 K/uL 6.6  5.3  5.7   Hemoglobin 13.0 - 17.0 g/dL 8.4  8.0  7.7   Hematocrit 39.0 - 52.0 % 28.3  26.4  25.5   Platelets 150 - 400 K/uL 253  242  218      RADS: Pending CT  Assessment:   Transverse colon mass-gross description in evaluation on colonoscopy as well as history of new onset anemia concerning for likely malignancy.  Will proceed with CT abdomen pelvis and CEA as part of preoperative workup for now.  Plan:   Will follow-up on CT and CEA results once available.  From a surgery standpoint, if the patient remains asymptomatic and is otherwise okay to be discharged from a cardiac and respiratory standpoint, we can continue workup and scheduling for any possible surgeries in the future on an outpatient basis.  Will continue to follow while patient is in house.    The patient verbalized understanding and all questions were answered to the patient's  satisfaction.  labs/images/medications/previous chart entries reviewed personally and relevant changes/updates noted above.

## 2023-08-25 NOTE — Progress Notes (Signed)
 Patient refusing oral contrast for CT scan. Doctor Tye and Doctor Yates aware.   Patient to receive IV contrast in CT scan only. CT aware.

## 2023-08-25 NOTE — Progress Notes (Signed)
 Discharge instructions (including medications) discussed with and copy provided to patient. Patient given the opportunity to ask questions. Questions clarified.   Patient does not want to be rolled downstairs in wheelchair. Patient will be driving himself home and has requested to walk out.

## 2023-08-25 NOTE — Progress Notes (Signed)
 This patient had EGD and colonoscopy done for anemia.  The patient had a normal EGD but the colonoscopy showed a large mass that was in the transverse colon.  The mass was biopsied and injected for tattooing.  A surgical consult has been requested.  Nothing further to do from a GI point of view.  I will sign off.  Please call if any further GI concerns or questions.  We would like to thank you for the opportunity to participate in the care of Erik Martinez.

## 2023-08-25 NOTE — Anesthesia Postprocedure Evaluation (Signed)
 Anesthesia Post Note  Patient: Erik Martinez  Procedure(s) Performed: COLONOSCOPY EGD (ESOPHAGOGASTRODUODENOSCOPY)  Patient location during evaluation: PACU Anesthesia Type: General Level of consciousness: awake and awake and alert Pain management: satisfactory to patient Vital Signs Assessment: post-procedure vital signs reviewed and stable Respiratory status: spontaneous breathing Cardiovascular status: blood pressure returned to baseline Anesthetic complications: no   No notable events documented.   Last Vitals:  Vitals:   08/25/23 0915 08/25/23 1116  BP: 114/60 104/89  Pulse:  (!) 103  Resp:  18  Temp:  36.9 C  SpO2:  100%    Last Pain:  Vitals:   08/25/23 1047  TempSrc:   PainSc: 4                  VAN STAVEREN,Lukus Binion

## 2023-08-25 NOTE — Plan of Care (Signed)
 Problem: Education: Goal: Knowledge of General Education information will improve Description: Including pain rating scale, medication(s)/side effects and non-pharmacologic comfort measures 08/25/2023 1544 by Johnie Will HERO, RN Outcome: Adequate for Discharge 08/25/2023 1024 by Johnie Will HERO, RN Outcome: Progressing   Problem: Health Behavior/Discharge Planning: Goal: Ability to manage health-related needs will improve 08/25/2023 1544 by Johnie Will HERO, RN Outcome: Adequate for Discharge 08/25/2023 1024 by Johnie Will HERO, RN Outcome: Progressing   Problem: Clinical Measurements: Goal: Ability to maintain clinical measurements within normal limits will improve 08/25/2023 1544 by Johnie Will HERO, RN Outcome: Adequate for Discharge 08/25/2023 1024 by Johnie Will HERO, RN Outcome: Progressing Goal: Will remain free from infection 08/25/2023 1544 by Johnie Will HERO, RN Outcome: Adequate for Discharge 08/25/2023 1024 by Johnie Will HERO, RN Outcome: Progressing Goal: Diagnostic test results will improve 08/25/2023 1544 by Johnie Will HERO, RN Outcome: Adequate for Discharge 08/25/2023 1024 by Johnie Will HERO, RN Outcome: Progressing Goal: Respiratory complications will improve 08/25/2023 1544 by Johnie Will HERO, RN Outcome: Adequate for Discharge 08/25/2023 1024 by Johnie Will HERO, RN Outcome: Progressing Goal: Cardiovascular complication will be avoided 08/25/2023 1544 by Johnie Will HERO, RN Outcome: Adequate for Discharge 08/25/2023 1024 by Johnie Will HERO, RN Outcome: Progressing   Problem: Activity: Goal: Risk for activity intolerance will decrease 08/25/2023 1544 by Johnie Will HERO, RN Outcome: Adequate for Discharge 08/25/2023 1024 by Johnie Will HERO, RN Outcome: Progressing   Problem: Nutrition: Goal: Adequate nutrition will be maintained 08/25/2023 1544 by Johnie Will HERO, RN Outcome: Adequate for  Discharge 08/25/2023 1024 by Johnie Will HERO, RN Outcome: Progressing   Problem: Coping: Goal: Level of anxiety will decrease 08/25/2023 1544 by Johnie Will HERO, RN Outcome: Adequate for Discharge 08/25/2023 1024 by Johnie Will HERO, RN Outcome: Progressing   Problem: Elimination: Goal: Will not experience complications related to bowel motility 08/25/2023 1544 by Johnie Will HERO, RN Outcome: Adequate for Discharge 08/25/2023 1024 by Johnie Will HERO, RN Outcome: Not Progressing Note: See colonoscopy results; pt to f/u outpt Goal: Will not experience complications related to urinary retention 08/25/2023 1544 by Johnie Will HERO, RN Outcome: Adequate for Discharge 08/25/2023 1024 by Johnie Will HERO, RN Outcome: Progressing   Problem: Pain Managment: Goal: General experience of comfort will improve and/or be controlled 08/25/2023 1544 by Johnie Will HERO, RN Outcome: Adequate for Discharge 08/25/2023 1024 by Johnie Will HERO, RN Outcome: Progressing   Problem: Safety: Goal: Ability to remain free from injury will improve 08/25/2023 1544 by Johnie Will HERO, RN Outcome: Adequate for Discharge 08/25/2023 1024 by Johnie Will HERO, RN Outcome: Progressing   Problem: Skin Integrity: Goal: Risk for impaired skin integrity will decrease 08/25/2023 1544 by Johnie Will HERO, RN Outcome: Adequate for Discharge 08/25/2023 1024 by Johnie Will HERO, RN Outcome: Progressing   Problem: Education: Goal: Ability to describe self-care measures that may prevent or decrease complications (Diabetes Survival Skills Education) will improve 08/25/2023 1544 by Johnie Will HERO, RN Outcome: Adequate for Discharge 08/25/2023 1024 by Johnie Will HERO, RN Outcome: Progressing Goal: Individualized Educational Video(s) 08/25/2023 1544 by Johnie Will HERO, RN Outcome: Adequate for Discharge 08/25/2023 1024 by Johnie Will HERO, RN Outcome: Progressing    Problem: Coping: Goal: Ability to adjust to condition or change in health will improve 08/25/2023 1544 by Johnie Will HERO, RN Outcome: Adequate for Discharge 08/25/2023 1024 by Johnie Will HERO, RN Outcome: Progressing   Problem: Fluid Volume: Goal: Ability to maintain a balanced intake and output will improve 08/25/2023 1544 by  Johnie Will HERO, RN Outcome: Adequate for Discharge 08/25/2023 1024 by Johnie Will HERO, RN Outcome: Progressing   Problem: Health Behavior/Discharge Planning: Goal: Ability to identify and utilize available resources and services will improve 08/25/2023 1544 by Johnie Will HERO, RN Outcome: Adequate for Discharge 08/25/2023 1024 by Johnie Will HERO, RN Outcome: Progressing Goal: Ability to manage health-related needs will improve 08/25/2023 1544 by Johnie Will HERO, RN Outcome: Adequate for Discharge 08/25/2023 1024 by Johnie Will HERO, RN Outcome: Progressing   Problem: Metabolic: Goal: Ability to maintain appropriate glucose levels will improve 08/25/2023 1544 by Johnie Will HERO, RN Outcome: Adequate for Discharge 08/25/2023 1024 by Johnie Will HERO, RN Outcome: Progressing   Problem: Nutritional: Goal: Maintenance of adequate nutrition will improve 08/25/2023 1544 by Johnie Will HERO, RN Outcome: Adequate for Discharge 08/25/2023 1024 by Johnie Will HERO, RN Outcome: Progressing Goal: Progress toward achieving an optimal weight will improve 08/25/2023 1544 by Johnie Will HERO, RN Outcome: Adequate for Discharge 08/25/2023 1024 by Johnie Will HERO, RN Outcome: Progressing   Problem: Skin Integrity: Goal: Risk for impaired skin integrity will decrease 08/25/2023 1544 by Johnie Will HERO, RN Outcome: Adequate for Discharge 08/25/2023 1024 by Johnie Will HERO, RN Outcome: Progressing   Problem: Tissue Perfusion: Goal: Adequacy of tissue perfusion will improve 08/25/2023 1544 by Johnie Will HERO,  RN Outcome: Adequate for Discharge 08/25/2023 1024 by Johnie Will HERO, RN Outcome: Progressing

## 2023-08-25 NOTE — Anesthesia Preprocedure Evaluation (Addendum)
 Anesthesia Evaluation  Patient identified by MRN, date of birth, ID band Patient awake    Reviewed: Allergy & Precautions, NPO status , Patient's Chart, lab work & pertinent test results  History of Anesthesia Complications (+) PONV and history of anesthetic complications  Airway Mallampati: II  TM Distance: >3 FB Neck ROM: full    Dental  (+) Upper Dentures, Lower Dentures   Pulmonary neg pulmonary ROS   Pulmonary exam normal breath sounds clear to auscultation       Cardiovascular Exercise Tolerance: Good + Peripheral Vascular Disease  negative cardio ROS Normal cardiovascular exam Rhythm:Regular Rate:Normal     Neuro/Psych  Headaches  Anxiety  Bipolar Disorder   negative neurological ROS  negative psych ROS   GI/Hepatic negative GI ROS,GERD  Medicated,,(+)     substance abuse  cocaine use, Hepatitis -, C  Endo/Other  diabetes, Well Controlled, Type 1, Insulin  Dependent    Renal/GU negative Renal ROS  negative genitourinary   Musculoskeletal  (+) Arthritis ,    Abdominal   Peds negative pediatric ROS (+)  Hematology negative hematology ROS (+) Blood dyscrasia, anemia   Anesthesia Other Findings Past Medical History: No date: Anemia No date: Anxiety No date: Arthritis No date: Avascular necrosis of bones of both hips (HCC) No date: Bipolar disorder (HCC) No date: Bladder spasms No date: Chronic pain of left knee No date: Chronic pain syndrome     Comment:  a.) followed by pain clinic 09/09/2015: Cocaine use disorder, moderate, dependence (HCC) No date: DDD (degenerative disc disease) No date: Depression No date: Erectile dysfunction     Comment:  a.) on PDE5i (sildenafil) No date: Frequency-urgency syndrome No date: GERD (gastroesophageal reflux disease) 2018: Hepatitis C No date: History of hepatitis B     Comment:  1983  TX'D--  NO ISSUES OR SYMPTOMS SINCE No date: Hyperlipidemia No date:  Hypotestosteronemia 09/08/2015: 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HCC) No date: Neurogenic bladder No date: Nocturia No date: PAD (peripheral artery disease) (HCC) No date: Polysubstance abuse (HCC)     Comment:  a.) cocaine + marijuana + BZO + opioids No date: PONV (postoperative nausea and vomiting)     Comment:  difficult to put me to sleep No date: Spinal headache No date: T2DM (type 2 diabetes mellitus) (HCC) No date: Urine incontinence  Past Surgical History: 09/11/2021: APPLICATION OF WOUND VAC; Left     Comment:  Procedure: APPLICATION OF WOUND VAC;  Surgeon: [name], MD;  Location: [facility];  Service: Orthopedics;               Laterality: Left;  [id number]  No date: BACK SURGERY     Comment:  2 rods and 4 screws artificial disc 2008: COLONOSCOPY 2007: INTERSTIM IMPLANT PLACEMENT 08/17/2012: INTERSTIM IMPLANT REVISION; N/A     Comment:  Procedure: REPLACMENT OF IPG PLUS REPLACE LEAD OF               INTERSTIM IMPLANT ;  Surgeon: [name], MD;                Location: [facility];  Service: Urology;               Laterality: N/A; 09/11/2021: IRRIGATION AND DEBRIDEMENT KNEE; Left     Comment:  Procedure: IRRIGATION AND DEBRIDEMENT WITH POLY EXCHANGE              LEFT KNEE;  Surgeon: [name], MD;  Location: [facility]              [facility];  Service: Orthopedics;  Laterality: Left; 06/01/2022: IRRIGATION AND DEBRIDEMENT KNEE; Left     Comment:  Procedure: IRRIGATION AND DEBRIDEMENT KNEE;  Surgeon:               [name], MD;  Location: [facility];  Service:               Orthopedics;  Laterality: Left; 11/17/2020: LOWER EXTREMITY ANGIOGRAPHY; Left     Comment:  Procedure: LOWER EXTREMITY ANGIOGRAPHY;  Surgeon:               [name], MD;  Location: [facility];               Service: Cardiovascular;  Laterality: Left; 12/22/2020: LOWER EXTREMITY ANGIOGRAPHY; Right     Comment:  Procedure: LOWER EXTREMITY ANGIOGRAPHY;   Surgeon:               [name], MD;  Location: [facility];               Service: Cardiovascular;  Laterality: Right; 12/14/2021: LOWER EXTREMITY ANGIOGRAPHY; Left     Comment:  Procedure: Lower Extremity Angiography;  Surgeon:               [name], MD;  Location: [facility];               Service: Cardiovascular;  Laterality: Left; 2005: LUMBAR DISC SURGERY     Comment:  L5 X2  2006  &  2007: LUMBAR FUSION 08/2002: MANIPULATION KNEE JOINT; Left 05/01/2007: OPEN DEBRIDEMENT LEFT TOTAL KNEE (SCAR, BONEY GRAOWTH)/  REMOVAL OLD SUTURES 2001: PARTIAL HIP ARTHROPLASTY; Left     Comment:  x2 2000: SHOULDER OPEN ROTATOR CUFF REPAIR; Left 2002: TOTAL HIP ARTHROPLASTY; Left 06/2002: TOTAL KNEE ARTHROPLASTY; Left     Comment:  PARTIAL LEFT KNEE REPLACEMENT PRIOR TO THIS 2008: TOTAL KNEE REVISION; Left 07/19/2021: TOTAL KNEE REVISION; Left     Comment:  Procedure: TOTAL KNEE REVISION;  Surgeon: [name], MD;  Location: [facility];  Service: Orthopedics;                Laterality: Left; 2005: TOTAL KNEE REVISION; Left 06/01/2022: TOTAL KNEE REVISION; Left     Comment:  Procedure: TOTAL KNEE REVISION;  Surgeon: [name], MD;  Location: [facility];  Service: Orthopedics;                Laterality: Left; 04/01/2022: TOTAL KNEE REVISION; Left     Comment:  Procedure: Removal of left total knee implants with               insertion of spacer.;  Surgeon: [name], MD;                Location: [facility];  Service: Orthopedics;  Laterality:               Left; No date: TRIGGER FINGER RELEASE; Bilateral     Comment:  SEVERAL FINGERS 06/18/2014: TRIGGER FINGER RELEASE; Right     Comment:  Procedure: RELEASE TRIGGER FINGER/A-1 PULLEY;  Surgeon:  Lynwood [name] Hue, MD;  Location: [facility];  Service:               Orthopedics;  Laterality: Right;  BMI    Body Mass Index: 26.58 kg/m       Reproductive/Obstetrics negative OB ROS                              Anesthesia Physical Anesthesia Plan  ASA: 3  Anesthesia Plan: General   Post-op Pain Management:    Induction: Intravenous  PONV Risk Score and Plan: Propofol  infusion and TIVA  Airway Management Planned: Natural Airway and Nasal Cannula  Additional Equipment:   Intra-op Plan:   Post-operative Plan:   Informed Consent: I have reviewed the patients History and Physical, chart, labs and discussed the procedure including the risks, benefits and alternatives for the proposed anesthesia with the patient or authorized representative who has indicated his/her understanding and acceptance.     Dental Advisory Given  Plan Discussed with: CRNA  Anesthesia Plan Comments:          Anesthesia Quick Evaluation

## 2023-08-26 LAB — CEA: CEA: 6.6 ng/mL — ABNORMAL HIGH (ref 0.0–4.7)

## 2023-08-28 ENCOUNTER — Ambulatory Visit: Attending: Family | Admitting: Family

## 2023-08-28 ENCOUNTER — Encounter: Payer: Self-pay | Admitting: Family

## 2023-08-28 ENCOUNTER — Telehealth: Payer: Self-pay

## 2023-08-28 VITALS — BP 124/71 | HR 97 | Wt 185.1 lb

## 2023-08-28 DIAGNOSIS — Z5941 Food insecurity: Secondary | ICD-10-CM | POA: Diagnosis not present

## 2023-08-28 DIAGNOSIS — Z79899 Other long term (current) drug therapy: Secondary | ICD-10-CM | POA: Diagnosis not present

## 2023-08-28 DIAGNOSIS — D649 Anemia, unspecified: Secondary | ICD-10-CM | POA: Diagnosis not present

## 2023-08-28 DIAGNOSIS — Z7985 Long-term (current) use of injectable non-insulin antidiabetic drugs: Secondary | ICD-10-CM | POA: Diagnosis not present

## 2023-08-28 DIAGNOSIS — F319 Bipolar disorder, unspecified: Secondary | ICD-10-CM | POA: Insufficient documentation

## 2023-08-28 DIAGNOSIS — E119 Type 2 diabetes mellitus without complications: Secondary | ICD-10-CM | POA: Diagnosis not present

## 2023-08-28 DIAGNOSIS — E782 Mixed hyperlipidemia: Secondary | ICD-10-CM

## 2023-08-28 DIAGNOSIS — E785 Hyperlipidemia, unspecified: Secondary | ICD-10-CM | POA: Diagnosis not present

## 2023-08-28 DIAGNOSIS — Z794 Long term (current) use of insulin: Secondary | ICD-10-CM | POA: Diagnosis not present

## 2023-08-28 DIAGNOSIS — R918 Other nonspecific abnormal finding of lung field: Secondary | ICD-10-CM | POA: Diagnosis not present

## 2023-08-28 DIAGNOSIS — Z7984 Long term (current) use of oral hypoglycemic drugs: Secondary | ICD-10-CM | POA: Diagnosis not present

## 2023-08-28 DIAGNOSIS — R7989 Other specified abnormal findings of blood chemistry: Secondary | ICD-10-CM | POA: Insufficient documentation

## 2023-08-28 DIAGNOSIS — E1151 Type 2 diabetes mellitus with diabetic peripheral angiopathy without gangrene: Secondary | ICD-10-CM | POA: Diagnosis not present

## 2023-08-28 DIAGNOSIS — I428 Other cardiomyopathies: Secondary | ICD-10-CM | POA: Diagnosis not present

## 2023-08-28 DIAGNOSIS — D509 Iron deficiency anemia, unspecified: Secondary | ICD-10-CM | POA: Diagnosis not present

## 2023-08-28 DIAGNOSIS — Z791 Long term (current) use of non-steroidal anti-inflammatories (NSAID): Secondary | ICD-10-CM | POA: Diagnosis not present

## 2023-08-28 DIAGNOSIS — I5032 Chronic diastolic (congestive) heart failure: Secondary | ICD-10-CM | POA: Insufficient documentation

## 2023-08-28 LAB — SURGICAL PATHOLOGY

## 2023-08-28 MED ORDER — SPIRONOLACTONE 25 MG PO TABS: 25.0000 mg | ORAL_TABLET | Freq: Every day | ORAL | 3 refills | Status: AC

## 2023-08-28 MED ORDER — FUROSEMIDE 20 MG PO TABS: 20.0000 mg | ORAL_TABLET | Freq: Every day | ORAL | 1 refills | Status: DC

## 2023-08-28 NOTE — Patient Instructions (Signed)
 Medication Changes:  START Spironolactone  25mg   (1 tab) daily  START Furosemide  20mg  (1 tab) daily   Special Instructions // Education:  Please wear compression socks daily and remove at bedtime.   Follow-Up in: Please follow up with the [facility] in 1 weeks with Ellouise Class, FNP.   Thank you for choosing [facility].    At the [facility], you and your health needs are our priority. We have a designated team specialized in the treatment of Heart Failure. This Care Team includes your primary Heart Failure Specialized Cardiologist (physician), Advanced Practice Providers (APPs- Physician Assistants and Nurse Practitioners), and Pharmacist who all work together to provide you with the care you need, when you need it.   You may see any of the following providers on your designated Care Team at your next follow up:  Dr. Toribio Fuel Dr. Ezra Shuck Dr. Ria Commander Dr. Morene Brownie Ellouise Class, FNP Jaun Bash, RPH-CPP  Please be sure to bring in all your medications bottles to every appointment.   Need to Contact Us :  If you have any questions or concerns before your next appointment please send us  a message through [place] or call our office at [phone].    TO LEAVE A MESSAGE FOR THE NURSE SELECT OPTION 2, PLEASE LEAVE A MESSAGE INCLUDING: YOUR NAME DATE OF BIRTH CALL BACK NUMBER REASON FOR CALL**this is important as we prioritize the call backs  YOU WILL RECEIVE A CALL BACK THE SAME DAY AS LONG AS YOU CALL BEFORE 4:00 PM

## 2023-08-28 NOTE — Progress Notes (Signed)
 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    Admitted 08/22/23 with shortness of breath and leg edema due to HF exacerbation due to severe anemia. Hg 7.3. Chest x-ray consistent with pulmonary vascular congestion. BNP elevated. Started on IV lasix  and cardiology consulted. Started on protonix  and GI consulted for anemia. Echo 08/24/23: EF 50-55%, G1DD, normal RV, mild/ moderate Erik. Normal EGD 08/25/23. Colonoscopy 08/25/23 with a partially obstructing large mass in the transverse colon, likely malignant, biopsied. Surgery consulted. Chest/ abdomen/ pelvic CT 08/25/23 showing 4mm nodule, 5mm nodule right lung, colon mass.   He presents today for his initial HF visit with a chief complaint of pedal edema. Says that his pedal edema is worsening since his hospital discharge. Sleeping well on 1 pillow. Denies shortness of breath, fatigue, chest pain, palpitations, dizziness.   Has surgical appt 09/06/23 to discuss colon mass biopsy. Said that he just received a phone call saying that it hasn't spread.   Lost his pit bull dog ~ 1 month ago and was devastated. Just received a 6 week Erik Martinez puppy yesterday. 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    Denies tobacco, alcohol, drug use.   Review of Systems: [y] = yes, [ ]  = no   General: Weight gain [ ] ; Weight loss [ ] ; Anorexia [ ] ; Fatigue [ ] ; Fever [ ] ; Chills [ ] ; Weakness [ ]   Cardiac: Chest pain/pressure [ ] ; Resting SOB [ ] ; Exertional SOB [ ] ; Orthopnea [ ] ; Pedal Edema [name] ]; Palpitations [ ] ; Syncope [ ] ; Presyncope [ ] ; Paroxysmal nocturnal  dyspnea[ ]   Pulmonary: Cough [ ] ; Wheezing[ ] ; Hemoptysis[ ] ; Sputum [ ] ; Snoring [ ]   GI: Vomiting[ ] ; Dysphagia[ ] ; Melena[ ] ; Hematochezia [ ] ; Heartburn[ ] ; Abdominal pain [ ] ; Constipation [ ] ; Diarrhea [ ] ; BRBPR [ ]   GU: Hematuria[ ] ; Dysuria [ ] ; Nocturia[ ]   Vascular: Pain in legs with walking [ ] ; Pain in feet with lying flat [ ] ; Non-healing sores [ ] ; Stroke [ ] ; TIA [ ] ; Slurred speech [ ] ;  Neuro: Headaches[ ] ; Vertigo[ ] ; Seizures[ ] ; Paresthesias[ ] ;Blurred vision [ ] ; Diplopia [ ] ; Vision changes [ ]   Ortho/Skin: Arthritis [ ] ; Joint pain [ ] ; Muscle pain [ ] ; Joint swelling [ ] ; Back Pain [ ] ; Rash [ ]   Psych: Depression[ ] ; Anxiety[ ]   Heme: Bleeding problems [ ] ; Clotting disorders [ ] ; Anemia [name] ]  Endocrine: Diabetes [ ] ; Thyroid  dysfunction[ ]    Past Medical History:  Diagnosis Date   Anemia    Anxiety    Arthritis    Avascular necrosis of bones of both hips (HCC)    Bipolar disorder (HCC)    Bladder spasms    Chronic pain of left knee    Chronic pain syndrome    a.) followed by pain clinic   Cocaine use disorder, moderate, dependence (HCC) 09/09/2015   DDD (degenerative disc disease)    Depression    Erectile dysfunction    a.) on PDE5i (sildenafil)   Frequency-urgency syndrome    GERD (gastroesophageal reflux disease)  Hepatitis C 2018   History of hepatitis B    1983  TX'D--  NO ISSUES OR SYMPTOMS SINCE   Hyperlipidemia    Hypotestosteronemia    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     [Paused] celecoxib   (CELEBREX ) 200 MG capsule Take 1 capsule (200 mg total) by mouth 2 (two) times daily. 30 capsule 2   Continuous Glucose Sensor (FREESTYLE LIBRE 3 PLUS SENSOR) MISC CHANCE SENSOR EVERY 15 DAYS 2 each 3   cyclobenzaprine  (FLEXERIL ) 10 MG tablet 3 (three) times daily.     empagliflozin  (JARDIANCE ) 25 MG TABS tablet Take 1 tablet (25 mg total) by mouth daily. 90 tablet 1   furosemide  (LASIX ) 20 MG tablet Take 1 tablet (20 mg total) by mouth daily. 5 tablet 0   insulin  aspart (NOVOLOG ) 100 UNIT/ML injection Per sliding scale  MAX 15 units in 24 hour period. 10 mL PRN   iron  polysaccharides (NIFEREX) 150 MG capsule Take 1 capsule (150 mg total) by mouth daily. 30 capsule 0   lidocaine  (LIDODERM ) 5 % Place 3 patches onto the skin daily. Remove & Discard patch within 12 hours or as directed by MD 90 patch 0   lubiprostone  (AMITIZA ) 8 MCG capsule Take 1 capsule (8 mcg total) by mouth 2 (two) times daily with a meal. HAS failed mulitple OTC treatment for constipaiton 60 capsule 0   metFORMIN  (GLUCOPHAGE ) 1000 MG tablet Take 1,000 mg by mouth 2 (two) times daily.     ondansetron  (ZOFRAN ) 4 MG tablet Take 1 tablet (4 mg total) by mouth every 8 (eight) hours as needed for nausea or vomiting. 20 tablet 1   Oxycodone  HCl 20 MG TABS Take 1 tablet by mouth every 6 (six) hours as needed.     pantoprazole  (PROTONIX ) 40 MG tablet Take 1 tablet (40 mg total) by mouth daily. 90 tablet 3   polyethylene glycol (MIRALAX  / GLYCOLAX ) 17 g packet Take 17 g by mouth daily as needed for mild constipation. 14 each 0   pregabalin  (LYRICA ) 200 MG capsule Take 1 capsule (200 mg total) by mouth in the morning, at noon, and at bedtime. 90 capsule 0   [Paused] Semaglutide , 2 MG/DOSE, (OZEMPIC , 2 MG/DOSE,) 8 MG/3ML SOPN Inject 2 mg into the skin once a week. Monday     tadalafil (CIALIS) 20 MG tablet Take 20 mg by mouth daily as needed for erectile dysfunction.     terbinafine  (LAMISIL ) 250 MG tablet Take 1 tablet (250 mg total) by  mouth daily. 90 tablet 0   zolpidem  (AMBIEN ) 10 MG tablet Take 1 tablet (10 mg total) by mouth at bedtime as needed. for sleep 30 tablet 1   No current facility-administered medications for this visit.    Allergies  Allergen Reactions   Suboxone [Buprenorphine Hcl-Naloxone  Hcl] Nausea And Vomiting   Tape Other (See Comments)    Whelps *Paper Tape is ok    Codeine Rash, Itching and Nausea Only  Morphine  And Codeine Itching and Rash   Other Rash    Telemetry electrodes   Silicone Rash    Whelps -*Paper Tape is ok       Social History   Socioeconomic History   Marital status: Divorced    Spouse name: Not on file   Number of children: 3   Years of education: Not on file   Highest education level: Some college, no degree  Occupational History   Not on file  Tobacco Use   Smoking status: Never   Smokeless tobacco: Never  Vaping Use   Vaping status: Never Used  Substance and Sexual Activity   Alcohol use: Not Currently    Alcohol/week: 0.0 - 2.0 standard drinks of alcohol   Drug use: Not Currently    Types: Cocaine    Comment: last + cocaine UDS 07/2016   Sexual activity: Not Currently  Other Topics Concern   Not on file  Social History Narrative    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   Overall Financial Resource Strain (CARDIA)    Difficulty of Paying Living Expenses: Not very hard  Food Insecurity: Food Insecurity Present (07/11/2023)   Received from [facility]   Hunger Vital Sign    Within the past 12 months, you worried that your food would run out before you got the money to buy more.: Sometimes true    Within the past 12 months, the food you bought just didn't last and you didn't have money to get more.: Never true  Transportation Needs: No Transportation Needs (07/11/2023)   Received from [facility]  - Transportation    In the past 12 months, has lack of transportation kept you from medical appointments or from getting medications?: No    Lack of Transportation (Non-Medical): No  Physical Activity: Inactive (01/28/2022)   Exercise Vital Sign    Days of Exercise per Week: 0 days    Minutes of Exercise per Session: 0 min  Stress: No Stress Concern Present (01/28/2022)   [employer] of Occupational Health - Occupational Stress Questionnaire    Feeling of Stress : Not at all  Social Connections: Moderately Isolated (01/28/2022)   Social Connection and Isolation Panel    Frequency of Communication with Friends and Family: More than three times a week    Frequency of Social Gatherings with Friends and Family: More than three times a week    Attends Religious Services: More than 4 times per year    Active Member of [employer] or Organizations: No    Attends Banker Meetings: Never    Marital Status: Divorced  Catering manager Violence: Unknown (06/01/2022)   Humiliation, Afraid, Rape, and Kick questionnaire    Fear of Current or Ex-Partner: No    Emotionally Abused: No    Physically Abused: No    Sexually Abused: Not on file      Family History  Problem Relation Age of Onset   Diabetes Mother    Arthritis Mother    Cancer Mother        lung cancer   Hyperlipidemia Mother    Stroke Mother    Alcohol abuse Father    Cancer Father        lung   Arthritis Father    Diabetes Brother    Vitals:  08/28/23 1534  BP: 124/71  Pulse: 97  SpO2: 97%  Weight: 185 lb 2 oz (84 kg)   Wt Readings from Last 3 Encounters:  08/28/23 185 lb 2 oz (84 kg)  08/22/23 180 lb (81.6 kg)  08/18/23 189 lb 2 oz (85.8 kg)   Lab Results  Component Value Date   CREATININE 0.90 08/25/2023   CREATININE 1.00 08/24/2023   CREATININE 1.00 08/23/2023    PHYSICAL EXAM:  General: Well appearing.  Cor: No JVD. Regular rhythm, rate.  Lungs: clear Abdomen: soft, nontender,  nondistended. Extremities: 2+ pitting edema bilaterally up to knees Neuro:. Affect pleasant   ECG: not done   ASSESSMENT & PLAN:  1: NICM with preserved ejection fraction- - suspect due to anemia or previous cocaine use - NYHA class I - euvolemic - Echo 08/24/23: EF 50-55%, G1DD, normal RV, mild/ moderate Erik - continue jardiance  25mg  daily (DM dose) - begin spironolactone  25mg  daily - begin furosemide  20mg  daily - wear compression socks daily with removal at bedtime - reviewed sodium / fluid intake and parameters to keep under - work note keeping him out until seen next week - BMET next week - BMET 08/25/23 reviewed: sodium 137, potassium 4.3, creatinine 0.9 & GFR >60 - BNP 08/22/23 was 1782.6  2: T2DM- - A1c 07/18/23 was 7.4% - saw PCP Myrna) 08/25 - continue insulin / jardiance   3: Hyperlipidemia- - LDL 07/18/23 was 54  4: Anemia due to newly diagnosed colon mass- - Colonoscopy 08/25/23 with a partially obstructing large mass in the transverse colon, likely malignant, biopsied. Surgery consulted.  - Chest/ abdomen/ pelvic CT 08/25/23 showing 4mm nodule left lung fissure, 5mm nodule right lung fissure favored benign, colon mass. (Per report) - patient says that he sees surgeon next week to discuss further treatment and get biopsy results - Hg 08/25/23 was 8.4   Return in 1 week, sooner if needed.   Ellouise DELENA Class, FNP 08/28/23

## 2023-08-28 NOTE — Transitions of Care (Post Inpatient/ED Visit) (Signed)
   08/28/2023  Name: Erik Martinez MRN: [id number] DOB: 11/13/1955  Today's TOC FU Call Status: Today's TOC FU Call Status:: Unsuccessful Call (1st Attempt) Unsuccessful Call (1st Attempt) Date: 08/28/23  Attempted to reach the patient regarding the most recent Inpatient/ED visit.  Follow Up Plan: Additional outreach attempts will be made to reach the patient to complete the Transitions of Care (Post Inpatient/ED visit) call.   Arvin Seip RN, BSN, CCM [employer], Population Health Case Manager Phone: [phone]

## 2023-08-29 ENCOUNTER — Ambulatory Visit: Payer: Self-pay | Admitting: Family Medicine

## 2023-08-29 ENCOUNTER — Encounter: Admitting: Family

## 2023-08-29 ENCOUNTER — Telehealth: Payer: Self-pay | Admitting: *Deleted

## 2023-08-30 ENCOUNTER — Telehealth: Payer: Self-pay | Admitting: *Deleted

## 2023-08-30 ENCOUNTER — Telehealth: Payer: Self-pay | Admitting: Family

## 2023-08-30 NOTE — Telephone Encounter (Signed)
 Received patient assistance medications for patient.  Ozempic  4boxes  We have left message for patient to call office.

## 2023-08-30 NOTE — Telephone Encounter (Signed)
 Pt called concerned that his pcp would want the approval from a heart failure standpoint to restart his ozempic . Per Ellouise Class, FNP pt ok to resume ozempic  per pcp's guidelines.

## 2023-08-30 NOTE — Transitions of Care (Post Inpatient/ED Visit) (Signed)
   08/30/2023  Name: Erik Martinez MRN: [id number] DOB: September 24, 1955  Today's TOC FU Call Status: Today's TOC FU Call Status:: Unsuccessful Call (2nd Attempt) Unsuccessful Call (2nd Attempt) Date: 08/29/23  Attempted to reach the patient regarding the most recent Inpatient/ED visit.  Follow Up Plan: Additional outreach attempts will be made to reach the patient to complete the Transitions of Care (Post Inpatient/ED visit) call.   Cathlean Headland BSN RN [facility] Health Care Management Coordinator [email or web address] Direct Dial: [phone]  Fax: [phone] Website: [email or web address]

## 2023-08-30 NOTE — Transitions of Care (Post Inpatient/ED Visit) (Signed)
 08/30/2023  Name: Erik Martinez MRN: [id number] DOB: 1955-01-31  Today's TOC FU Call Status: Today's TOC FU Call Status:: Successful TOC FU Call Completed TOC FU Call Complete Date: 08/30/23 Patient's Name and Date of Birth confirmed.  Transition Care Management Follow-up Telephone Call Date of Discharge: 08/25/23 Discharge Facility: [facility]) Type of Discharge: Inpatient Admission Primary Inpatient Discharge Diagnosis:: Acute diastolic (congestive) heart failure How have you been since you were released from the hospital?: Better Any questions or concerns?: No  Items Reviewed: Did you receive and understand the discharge instructions provided?: Yes Medications obtained,verified, and reconciled?: Yes (Medications Reviewed) Any new allergies since your discharge?: No Dietary orders reviewed?: No Do you have support at home?: Yes People in Home [RPT]: alone Name of Support/Comfort Primary Source: friends  Medications Reviewed Today: Medications Reviewed Today     Reviewed by Kennieth Cathlean DEL, RN (Case Manager) on 08/30/23 at 1649  Med List Status: <None>   Medication Order Taking? Sig Documenting Provider Last Dose Status Informant  atorvastatin  (LIPITOR) 20 MG tablet [id number] Yes Take 20 mg by mouth at bedtime. [provider]  Active Self  celecoxib  (CELEBREX ) 200 MG capsule [id number]  Take 1 capsule (200 mg total) by mouth 2 (two) times daily.  Patient not taking: Reported on 08/28/2023   [name], MD  Active Self  Continuous Glucose Sensor (FREESTYLE LIBRE 3 PLUS SENSOR) MISC [id number] Yes CHANCE SENSOR EVERY 15 DAYS Bedsole, Amy E, MD  Active Self  cyclobenzaprine  (FLEXERIL ) 10 MG tablet [id number] Yes 3 (three) times daily. [provider]  Active Self  empagliflozin  (JARDIANCE ) 25 MG TABS tablet [id number] Yes Take 1 tablet (25 mg total) by mouth daily. [name], Rosaline GORMAN, FNP  Active Self  furosemide  (LASIX ) 20  MG tablet [id number] Yes Take 1 tablet (20 mg total) by mouth daily. [place] A, FNP  Active   insulin  aspart (NOVOLOG ) 100 UNIT/ML injection [id number]  Per sliding scale  MAX 15 units in 24 hour period. [name], MD  Active Self  iron  polysaccharides (NIFEREX) 150 MG capsule [id number] Yes Take 1 capsule (150 mg total) by mouth daily. Barbarann Nest, MD  Active   lidocaine  (LIDODERM ) 5 % [id number] Yes Place 3 patches onto the skin daily. Remove & Discard patch within 12 hours or as directed by MD [name], MD  Active Self  lubiprostone  (AMITIZA ) 8 MCG capsule [id number] Yes Take 1 capsule (8 mcg total) by mouth 2 (two) times daily with a meal. HAS failed mulitple OTC treatment for constipaiton Bedsole, Amy E, MD  Active Self  metFORMIN  (GLUCOPHAGE ) 1000 MG tablet [id number] Yes Take 1,000 mg by mouth 2 (two) times daily. [provider]  Active Self  ondansetron  (ZOFRAN ) 4 MG tablet [id number] Yes Take 1 tablet (4 mg total) by mouth every 8 (eight) hours as needed for nausea or vomiting. Fayette Bodily, MD  Active Self  Oxycodone  HCl 20 MG TABS [id number] Yes Take 1 tablet by mouth every 6 (six) hours as needed. [provider]  Active Self  pantoprazole  (PROTONIX ) 40 MG tablet [id number] Yes Take 1 tablet (40 mg total) by mouth daily. Schnier, Cordella MATSU, MD  Active Self  polyethylene glycol (MIRALAX  / GLYCOLAX ) 17 g packet [id number] Yes Take 17 g by mouth daily as needed for mild constipation. Barbarann Nest, MD  Active   pregabalin  (LYRICA ) 200 MG capsule [id number] Yes Take 1 capsule (200 mg total) by mouth in the morning, at  noon, and at bedtime. Bedsole, Amy E, MD  Active Self  Semaglutide , 2 MG/DOSE, (OZEMPIC , 2 MG/DOSE,) 8 MG/3ML SOPN [id number]  Inject 2 mg into the skin once a week. Monday  Patient not taking: Reported on 08/28/2023   [provider]  Active Self  spironolactone  (ALDACTONE ) 25 MG tablet [id number] Yes Take 1 tablet (25 mg total) by  mouth daily. [place] A, FNP  Active   tadalafil (CIALIS) 20 MG tablet [id number] Yes Take 20 mg by mouth daily as needed for erectile dysfunction. [provider]  Active Self  terbinafine  (LAMISIL ) 250 MG tablet [id number] Yes Take 1 tablet (250 mg total) by mouth daily. Janit Thresa HERO, DPM  Active Self  zolpidem  (AMBIEN ) 10 MG tablet [id number]  Take 1 tablet (10 mg total) by mouth at bedtime as needed. for sleep [name], MD  Active Self            Home Care and Equipment/Supplies: Were Home Health Services Ordered?: NA Any new equipment or medical supplies ordered?: NA  Functional Questionnaire: Do you need assistance with bathing/showering or dressing?: No Do you need assistance with meal preparation?: No Do you need assistance with eating?: No Do you have difficulty maintaining continence: No Do you need assistance with getting out of bed/getting out of a chair/moving?: No  Follow up appointments reviewed: PCP Follow-up appointment confirmed?: Yes Date of PCP follow-up appointment?: 09/01/23 Follow-up Provider: [facility] Follow-up appointment confirmed?: Yes Date of Specialist follow-up appointment?: 09/06/23 Follow-Up Specialty Provider:: Dr Darlyn Do you need transportation to your follow-up appointment?: No Do you understand care options if your condition(s) worsen?: Yes-patient verbalized understanding  SDOH Interventions Today    Flowsheet Row Most Recent Value  SDOH Interventions   Food Insecurity Interventions Community Resources Provided  [resorces provided by [facility] inpatient]  Housing Interventions Intervention Not Indicated  Transportation Interventions Patient Resources (Friends/Family)  Utilities Interventions Intervention Not Indicated  Care guide scheduled PCP appointment.   Cathlean Headland BSN RN [facility] Care Management Coordinator [email or web address] Direct Dial: [phone]   Fax: [phone] Website: [email or web address]

## 2023-08-31 ENCOUNTER — Other Ambulatory Visit

## 2023-08-31 DIAGNOSIS — S52135D Nondisplaced fracture of neck of left radius, subsequent encounter for closed fracture with routine healing: Secondary | ICD-10-CM | POA: Diagnosis not present

## 2023-08-31 NOTE — Telephone Encounter (Signed)
 We are just waiting for shipment from [employer] Patient Assistance.

## 2023-09-01 ENCOUNTER — Encounter: Admitting: Family

## 2023-09-01 ENCOUNTER — Encounter: Payer: Self-pay | Admitting: Family Medicine

## 2023-09-01 ENCOUNTER — Ambulatory Visit: Admitting: Family Medicine

## 2023-09-01 VITALS — BP 122/70 | HR 88 | Temp 97.7°F | Ht 68.0 in | Wt 182.0 lb

## 2023-09-01 DIAGNOSIS — E1142 Type 2 diabetes mellitus with diabetic polyneuropathy: Secondary | ICD-10-CM

## 2023-09-01 DIAGNOSIS — I5031 Acute diastolic (congestive) heart failure: Secondary | ICD-10-CM

## 2023-09-01 DIAGNOSIS — Z7985 Long-term (current) use of injectable non-insulin antidiabetic drugs: Secondary | ICD-10-CM | POA: Diagnosis not present

## 2023-09-01 DIAGNOSIS — C19 Malignant neoplasm of rectosigmoid junction: Secondary | ICD-10-CM

## 2023-09-01 DIAGNOSIS — D509 Iron deficiency anemia, unspecified: Secondary | ICD-10-CM

## 2023-09-01 MED ORDER — OZEMPIC (2 MG/DOSE) 8 MG/3ML ~~LOC~~ SOPN: 2.0000 mg | PEN_INJECTOR | SUBCUTANEOUS | Status: AC

## 2023-09-01 MED ORDER — CELECOXIB 200 MG PO CAPS: 200.0000 mg | ORAL_CAPSULE | Freq: Two times a day (BID) | ORAL | Status: DC

## 2023-09-01 NOTE — Progress Notes (Signed)
 Patient ID: NEVADA MULLETT, male    DOB: 03-06-1955, 68 y.o.   MRN: [id number]  This visit was conducted in person.  BP 122/70   Pulse 88   Temp 97.7 F (36.5 C) (Oral)   Ht 5' 8 (1.727 m)   Wt 182 lb (82.6 kg)   SpO2 96%   BMI 27.67 kg/m    CC:  Chief Complaint  Patient presents with   Hospitalization Follow-up    Subjective:   HPI: KEETON KASSEBAUM is a 68 y.o. male presenting on 09/01/2023 for Hospitalization Follow-up   Recent hospitalization August 22, 2023 with new diagnosis of congestive heart failure with preserved ejection fraction ( SOB and peripheral edema) likely due to anemia hg 7.3.  Chest x-ray showed pulmonary vascular congestion and BNP elevated. In hospital he was started on IV Lasix . Echocardiogram 08/24/2023 showed ejection fraction 50 to 55% mild to moderate MR  new anemia found to be likely secondary to new colon mass. Upper EGD unremarkable  colonoscopy performed August 25, 2023 showed partially obstructing large mass in transverse colon likely malignant biopsied. Surgery consulted Chest abdomen pelvis CT showed 4 mm nodule, 5 mm nodule right lung in the colon mass. Patient followed up with cardiology on August 28, 2023 in the heart failure clinic  Continued on Jardiance  25 mg daily.  Started spironolactone  25 mg daily and furosemide  20 mg daily.  Recommended compression socks. Plan BASIC metabolic panel next week  Wt Readings from Last 3 Encounters:  09/01/23 182 lb (82.6 kg)  08/28/23 185 lb 2 oz (84 kg)  08/22/23 180 lb (81.6 kg)    Unfortunately his dog died the other day.   Peripheral swelling significant resolved. Compression sock helping.  No further fatigue and shortness of breath.  On ferrous sulfate  once daily.. no N/V or constipation.  Since biopsy having abdominal soreness.  BM every few days.     Has upcoming appointment with surgeon next week for biopsy results. Relevant past medical, surgical, family and social history  reviewed and updated as indicated. Interim medical history since our last visit reviewed. Allergies and medications reviewed and updated. Outpatient Medications Prior to Visit  Medication Sig Dispense Refill   atorvastatin  (LIPITOR) 20 MG tablet Take 20 mg by mouth at bedtime.     Continuous Glucose Sensor (FREESTYLE LIBRE 3 PLUS SENSOR) MISC CHANCE SENSOR EVERY 15 DAYS 2 each 3   cyclobenzaprine  (FLEXERIL ) 10 MG tablet 3 (three) times daily.     empagliflozin  (JARDIANCE ) 25 MG TABS tablet Take 1 tablet (25 mg total) by mouth daily. 90 tablet 1   furosemide  (LASIX ) 20 MG tablet Take 1 tablet (20 mg total) by mouth daily. 90 tablet 1   insulin  aspart (NOVOLOG ) 100 UNIT/ML injection Per sliding scale  MAX 15 units in 24 hour period. 10 mL PRN   iron  polysaccharides (NIFEREX) 150 MG capsule Take 1 capsule (150 mg total) by mouth daily. 30 capsule 0   lidocaine  (LIDODERM ) 5 % Place 3 patches onto the skin daily. Remove & Discard patch within 12 hours or as directed by MD 90 patch 0   lubiprostone  (AMITIZA ) 8 MCG capsule Take 1 capsule (8 mcg total) by mouth 2 (two) times daily with a meal. HAS failed mulitple OTC treatment for constipaiton 60 capsule 0   Oxycodone  HCl 20 MG TABS Take 1 tablet by mouth every 6 (six) hours as needed.     pantoprazole  (PROTONIX ) 40 MG tablet Take 1 tablet (40 mg  total) by mouth daily. 90 tablet 3   polyethylene glycol (MIRALAX  / GLYCOLAX ) 17 g packet Take 17 g by mouth daily as needed for mild constipation. 14 each 0   pregabalin  (LYRICA ) 200 MG capsule Take 1 capsule (200 mg total) by mouth in the morning, at noon, and at bedtime. 90 capsule 0   spironolactone  (ALDACTONE ) 25 MG tablet Take 1 tablet (25 mg total) by mouth daily. 90 tablet 3   tadalafil (CIALIS) 20 MG tablet Take 20 mg by mouth daily as needed for erectile dysfunction.     terbinafine  (LAMISIL ) 250 MG tablet Take 1 tablet (250 mg total) by mouth daily. 90 tablet 0   zolpidem  (AMBIEN ) 10 MG tablet Take 1  tablet (10 mg total) by mouth at bedtime as needed. for sleep 30 tablet 1   celecoxib  (CELEBREX ) 200 MG capsule Take 1 capsule (200 mg total) by mouth 2 (two) times daily. 30 capsule 2   Semaglutide , 2 MG/DOSE, (OZEMPIC , 2 MG/DOSE,) 8 MG/3ML SOPN Inject 2 mg into the skin once a week. Monday     metFORMIN  (GLUCOPHAGE ) 1000 MG tablet Take 1,000 mg by mouth 2 (two) times daily. (Patient not taking: Reported on 09/01/2023)     ondansetron  (ZOFRAN ) 4 MG tablet Take 1 tablet (4 mg total) by mouth every 8 (eight) hours as needed for nausea or vomiting. (Patient not taking: Reported on 09/01/2023) 20 tablet 1   No facility-administered medications prior to visit.     Per HPI unless specifically indicated in ROS section below Review of Systems  Constitutional:  Negative for fatigue and fever.  HENT:  Negative for ear pain.   Eyes:  Negative for pain.  Respiratory:  Negative for cough and shortness of breath.   Cardiovascular:  Negative for chest pain, palpitations and leg swelling.  Gastrointestinal:  Negative for abdominal pain.  Genitourinary:  Negative for dysuria.  Musculoskeletal:  Negative for arthralgias.  Neurological:  Negative for syncope, light-headedness and headaches.  Psychiatric/Behavioral:  Negative for dysphoric mood.    Objective:  BP 122/70   Pulse 88   Temp 97.7 F (36.5 C) (Oral)   Ht 5' 8 (1.727 m)   Wt 182 lb (82.6 kg)   SpO2 96%   BMI 27.67 kg/m   Wt Readings from Last 3 Encounters:  09/01/23 182 lb (82.6 kg)  08/28/23 185 lb 2 oz (84 kg)  08/22/23 180 lb (81.6 kg)      Physical Exam Vitals reviewed.  Constitutional:      Appearance: He is well-developed.  HENT:     Head: Normocephalic.     Right Ear: Hearing normal.     Left Ear: Hearing normal.     Nose: Nose normal.  Neck:     Thyroid : No thyroid  mass or thyromegaly.     Vascular: No carotid bruit.     Trachea: Trachea normal.  Cardiovascular:     Rate and Rhythm: Normal rate and regular rhythm.      Pulses: Normal pulses.     Heart sounds: Heart sounds not distant. No murmur heard.    No friction rub. No gallop.     Comments: No peripheral edema Pulmonary:     Effort: Pulmonary effort is normal. No respiratory distress.     Breath sounds: Normal breath sounds.  Abdominal:     General: Bowel sounds are normal.     Tenderness: There is abdominal tenderness. There is no right CVA tenderness, left CVA tenderness, guarding or rebound.  Skin:    General: Skin is warm and dry.     Findings: No rash.  Psychiatric:        Speech: Speech normal.        Behavior: Behavior normal.        Thought Content: Thought content normal.       Lab Results  Component Value Date   HGBA1C 7.4 (H) 07/18/2023    Results for orders placed or performed during the hospital encounter of 08/22/23  Basic metabolic panel   Collection Time: 08/22/23 10:53 AM  Result Value Ref Range   Sodium 134 (L) 135 - 145 mmol/L   Potassium 3.3 (L) 3.5 - 5.1 mmol/L   Chloride 99 98 - 111 mmol/L   CO2 24 22 - 32 mmol/L   Glucose, Bld 135 (H) 70 - 99 mg/dL   BUN 14 8 - 23 mg/dL   Creatinine, Ser 9.05 0.61 - 1.24 mg/dL   Calcium  8.0 (L) 8.9 - 10.3 mg/dL   GFR, Estimated >39 >39 mL/min   Anion gap 11 5 - 15  CBC   Collection Time: 08/22/23 10:53 AM  Result Value Ref Range   WBC 5.1 4.0 - 10.5 K/uL   RBC 3.87 (L) 4.22 - 5.81 MIL/uL   Hemoglobin 7.8 (L) 13.0 - 17.0 g/dL   HCT 74.1 (L) 60.9 - 47.9 %   MCV 66.7 (L) 80.0 - 100.0 fL   MCH 20.2 (L) 26.0 - 34.0 pg   MCHC 30.2 30.0 - 36.0 g/dL   RDW 78.6 (H) 88.4 - 84.4 %   Platelets 226 150 - 400 K/uL   nRBC 0.0 0.0 - 0.2 %  Brain natriuretic peptide   Collection Time: 08/22/23 10:53 AM  Result Value Ref Range   B Natriuretic Peptide 1,782.6 (H) 0.0 - 100.0 pg/mL  Lipase, blood   Collection Time: 08/22/23 10:53 AM  Result Value Ref Range   Lipase 45 11 - 51 U/L  Hepatic function panel   Collection Time: 08/22/23 10:53 AM  Result Value Ref Range   Total  Protein 6.4 (L) 6.5 - 8.1 g/dL   Albumin 2.7 (L) 3.5 - 5.0 g/dL   AST 891 (H) 15 - 41 U/L   ALT 485 (H) 0 - 44 U/L   Alkaline Phosphatase 95 38 - 126 U/L   Total Bilirubin 1.0 0.0 - 1.2 mg/dL   Bilirubin, Direct 0.3 (H) 0.0 - 0.2 mg/dL   Indirect Bilirubin 0.7 0.3 - 0.9 mg/dL  Iron  and TIBC   Collection Time: 08/22/23 10:53 AM  Result Value Ref Range   Iron  8 (L) 45 - 182 ug/dL   TIBC 639 749 - 549 ug/dL   Saturation Ratios 2 (L) 17.9 - 39.5 %   UIBC 352 ug/dL  Type and screen [facility]   Collection Time: 08/22/23 10:53 AM  Result Value Ref Range   ABO/RH(D) A POS    Antibody Screen NEG    Sample Expiration      08/25/2023,2359 Performed at [facility], [address] [place], [place] [address]   D-dimer, quantitative   Collection Time: 08/22/23  1:21 PM  Result Value Ref Range   D-Dimer, Quant 2.66 (H) 0.00 - 0.50 ug/mL-FEU  Magnesium    Collection Time: 08/22/23  1:21 PM  Result Value Ref Range   Magnesium  2.0 1.7 - 2.4 mg/dL  Troponin I (High Sensitivity)   Collection Time: 08/22/23  1:21 PM  Result Value Ref Range   Troponin I (  High Sensitivity) 108 (HH) <18 ng/L  Protime-INR   Collection Time: 08/22/23  2:30 PM  Result Value Ref Range   Prothrombin Time 16.4 (H) 11.4 - 15.2 seconds   INR 1.2 0.8 - 1.2  Vitamin B12   Collection Time: 08/22/23  7:12 PM  Result Value Ref Range   Vitamin B-12 1,105 (H) 180 - 914 pg/mL  HIV Antibody (routine testing w rflx)   Collection Time: 08/22/23  8:33 PM  Result Value Ref Range   HIV Screen 4th Generation wRfx Non Reactive Non Reactive  Glucose, capillary   Collection Time: 08/22/23 10:12 PM  Result Value Ref Range   Glucose-Capillary 128 (H) 70 - 99 mg/dL  CBC   Collection Time: 08/23/23  4:00 AM  Result Value Ref Range   WBC 5.7 4.0 - 10.5 K/uL   RBC 3.89 (L) 4.22 - 5.81 MIL/uL   Hemoglobin 7.7 (L) 13.0 - 17.0 g/dL   HCT 74.4 (L) 60.9 - 47.9 %   MCV 65.6 (L) 80.0 - 100.0 fL   MCH  19.8 (L) 26.0 - 34.0 pg   MCHC 30.2 30.0 - 36.0 g/dL   RDW 79.2 (H) 88.4 - 84.4 %   Platelets 218 150 - 400 K/uL   nRBC 0.0 0.0 - 0.2 %  Basic metabolic panel   Collection Time: 08/23/23  4:00 AM  Result Value Ref Range   Sodium 138 135 - 145 mmol/L   Potassium 3.1 (L) 3.5 - 5.1 mmol/L   Chloride 99 98 - 111 mmol/L   CO2 26 22 - 32 mmol/L   Glucose, Bld 145 (H) 70 - 99 mg/dL   BUN 13 8 - 23 mg/dL   Creatinine, Ser 8.99 0.61 - 1.24 mg/dL   Calcium  8.0 (L) 8.9 - 10.3 mg/dL   GFR, Estimated >39 >39 mL/min   Anion gap 13 5 - 15  Glucose, capillary   Collection Time: 08/23/23  7:43 AM  Result Value Ref Range   Glucose-Capillary 147 (H) 70 - 99 mg/dL  Glucose, capillary   Collection Time: 08/23/23 12:13 PM  Result Value Ref Range   Glucose-Capillary 188 (H) 70 - 99 mg/dL  Urinalysis, Routine w reflex microscopic -Urine, Clean Catch   Collection Time: 08/23/23  2:50 PM  Result Value Ref Range   Color, Urine STRAW (A) YELLOW   APPearance CLEAR (A) CLEAR   Specific Gravity, Urine 1.006 1.005 - 1.030   pH 7.0 5.0 - 8.0   Glucose, UA >=500 (A) NEGATIVE mg/dL   Hgb urine dipstick NEGATIVE NEGATIVE   Bilirubin Urine NEGATIVE NEGATIVE   Ketones, ur NEGATIVE NEGATIVE mg/dL   Protein, ur NEGATIVE NEGATIVE mg/dL   Nitrite NEGATIVE NEGATIVE   Leukocytes,Ua NEGATIVE NEGATIVE   RBC / HPF 0-5 0 - 5 RBC/hpf   WBC, UA 0 0 - 5 WBC/hpf   Bacteria, UA NONE SEEN NONE SEEN   Squamous Epithelial / HPF 0 0 - 5 /HPF  Glucose, capillary   Collection Time: 08/23/23  4:27 PM  Result Value Ref Range   Glucose-Capillary 218 (H) 70 - 99 mg/dL  Glucose, capillary   Collection Time: 08/23/23 10:33 PM  Result Value Ref Range   Glucose-Capillary 186 (H) 70 - 99 mg/dL  Basic metabolic panel with GFR   Collection Time: 08/24/23  8:53 AM  Result Value Ref Range   Sodium 138 135 - 145 mmol/L   Potassium 3.7 3.5 - 5.1 mmol/L   Chloride 99 98 - 111 mmol/L   CO2  30 22 - 32 mmol/L   Glucose, Bld 142 (H) 70  - 99 mg/dL   BUN 18 8 - 23 mg/dL   Creatinine, Ser 8.99 0.61 - 1.24 mg/dL   Calcium  7.9 (L) 8.9 - 10.3 mg/dL   GFR, Estimated >39 >39 mL/min   Anion gap 9 5 - 15  Magnesium    Collection Time: 08/24/23  8:53 AM  Result Value Ref Range   Magnesium  2.2 1.7 - 2.4 mg/dL  CBC with Differential/Platelet   Collection Time: 08/24/23  8:53 AM  Result Value Ref Range   WBC 5.3 4.0 - 10.5 K/uL   RBC 3.97 (L) 4.22 - 5.81 MIL/uL   Hemoglobin 8.0 (L) 13.0 - 17.0 g/dL   HCT 73.5 (L) 60.9 - 47.9 %   MCV 66.5 (L) 80.0 - 100.0 fL   MCH 20.2 (L) 26.0 - 34.0 pg   MCHC 30.3 30.0 - 36.0 g/dL   RDW 79.0 (H) 88.4 - 84.4 %   Platelets 242 150 - 400 K/uL   nRBC 0.0 0.0 - 0.2 %   Neutrophils Relative % 56 %   Neutro Abs 3.0 1.7 - 7.7 K/uL   Lymphocytes Relative 24 %   Lymphs Abs 1.3 0.7 - 4.0 K/uL   Monocytes Relative 12 %   Monocytes Absolute 0.6 0.1 - 1.0 K/uL   Eosinophils Relative 7 %   Eosinophils Absolute 0.4 0.0 - 0.5 K/uL   Basophils Relative 1 %   Basophils Absolute 0.1 0.0 - 0.1 K/uL   WBC Morphology MORPHOLOGY UNREMARKABLE    RBC Morphology See Note    Smear Review Normal platelet morphology    Immature Granulocytes 0 %   Abs Immature Granulocytes 0.02 0.00 - 0.07 K/uL  Glucose, capillary   Collection Time: 08/24/23  9:04 AM  Result Value Ref Range   Glucose-Capillary 137 (H) 70 - 99 mg/dL  Glucose, capillary   Collection Time: 08/24/23 12:18 PM  Result Value Ref Range   Glucose-Capillary 262 (H) 70 - 99 mg/dL  ECHOCARDIOGRAM COMPLETE   Collection Time: 08/24/23  2:31 PM  Result Value Ref Range   Weight 2,880 oz   Height 69 in   BP 117/72 mmHg   Single Plane A2C EF 43.0 %   Single Plane A4C EF 42.7 %   Calc EF 43.1 %   S' Lateral 3.60 cm   Est EF 50 - 55%   Glucose, capillary   Collection Time: 08/24/23  4:18 PM  Result Value Ref Range   Glucose-Capillary 203 (H) 70 - 99 mg/dL  Urine Drug Screen, Qualitative ([facility] only)   Collection Time: 08/24/23  6:10 PM  Result Value  Ref Range   Tricyclic, Ur Screen POSITIVE (A) NONE DETECTED   Amphetamines, Ur Screen NONE DETECTED NONE DETECTED   MDMA (Ecstasy)Ur Screen NONE DETECTED NONE DETECTED   Cocaine Metabolite,Ur [place] NONE DETECTED NONE DETECTED   Opiate, Ur Screen NONE DETECTED NONE DETECTED   Phencyclidine (PCP) Ur S NONE DETECTED NONE DETECTED   Cannabinoid 50 Ng, Ur [place] NONE DETECTED NONE DETECTED   Barbiturates, Ur Screen NONE DETECTED NONE DETECTED   Benzodiazepine, Ur Scrn NONE DETECTED NONE DETECTED   Methadone Scn, Ur NONE DETECTED NONE DETECTED  Glucose, capillary   Collection Time: 08/24/23 10:32 PM  Result Value Ref Range   Glucose-Capillary 177 (H) 70 - 99 mg/dL  Surgical pathology   Collection Time: 08/25/23 12:00 AM  Result Value Ref Range   SURGICAL PATHOLOGY  SURGICAL PATHOLOGY [facility] [address], [address] [place], [place] [address] Telephone [phone] or [phone] Fax [phone]  REPORT OF SURGICAL PATHOLOGY   Accession #: [id number] Patient Name: BRADIE, LACOCK Visit # : [id number]  MRN: [id number] Physician: [name] DOB/Age 12-06-1955 (Age: 89) Gender: M Collected Date: 08/25/2023 Received Date: 08/25/2023  FINAL DIAGNOSIS       1. Transverse Colon Biopsy, cbx mass :       INVASIVE ADENOCARCINOMA, MODERATELY DIFFERENTIATED.      SEE NOTE.       Diagnosis Note : The case was peer-reviewed by Dr. Reed who agrees with      the diagnosis.      DATE SIGNED OUT: 08/28/2023 ELECTRONIC SIGNATURE : Pepper Dutton Md, Pathologist, Electronic Signature  MICROSCOPIC DESCRIPTION  CASE COMMENTS STAINS USED IN DIAGNOSIS: H&E Universal Negative Control-DAB MLH1 MSH2 MSH6 PMS2    CLINICAL HISTORY  SPECIMEN(S) OBTAINED 1. Transverse Colon Biopsy, Cbx Mass  SPECI MEN COMMENTS: SPECIMEN CLINICAL INFORMATION: 1. Iron  deficiency anemia.  Transverse colon mass    Gross Description 1. Received in formalin are tan, soft  tissue fragments that are submitted in toto.  Number:  5,   Size:  0.1-0.4 cm,  1 block. mb 08-25-23        Report signed out from the following location(s) [place] [address] [place], [place] [address] CLIA #: [id number]  [facility] [address] [place], [place] [address] CLIA #: [id number]   CBC   Collection Time: 08/25/23  3:08 AM  Result Value Ref Range   WBC 6.6 4.0 - 10.5 K/uL   RBC 4.18 (L) 4.22 - 5.81 MIL/uL   Hemoglobin 8.4 (L) 13.0 - 17.0 g/dL   HCT 71.6 (L) 60.9 - 47.9 %   MCV 67.7 (L) 80.0 - 100.0 fL   MCH 20.1 (L) 26.0 - 34.0 pg   MCHC 29.7 (L) 30.0 - 36.0 g/dL   RDW 79.1 (H) 88.4 - 84.4 %   Platelets 253 150 - 400 K/uL   nRBC 0.0 0.0 - 0.2 %  Basic metabolic panel with GFR   Collection Time: 08/25/23  3:08 AM  Result Value Ref Range   Sodium 137 135 - 145 mmol/L   Potassium 4.3 3.5 - 5.1 mmol/L   Chloride 97 (L) 98 - 111 mmol/L   CO2 31 22 - 32 mmol/L   Glucose, Bld 139 (H) 70 - 99 mg/dL   BUN 19 8 - 23 mg/dL   Creatinine, Ser 9.09 0.61 - 1.24 mg/dL   Calcium  8.3 (L) 8.9 - 10.3 mg/dL   GFR, Estimated >39 >39 mL/min   Anion gap 9 5 - 15  Glucose, capillary   Collection Time: 08/25/23 12:46 PM  Result Value Ref Range   Glucose-Capillary 282 (H) 70 - 99 mg/dL  CEA   Collection Time: 08/25/23  3:25 PM  Result Value Ref Range   CEA 6.6 (H) 0.0 - 4.7 ng/mL    Assessment and Plan  Primary colorectal adenocarcinoma [facility]) Assessment & Plan: New diagnosis Reviewed pathology results with patient.  Reviewed CT scan which did not show any clear metastasis or associated lymphadenopathy. He does have some abdominal discomfort currently. He has follow-up next week with surgeon for recommendations on next step in treatment plan.   Type 2 diabetes mellitus with diabetic polyneuropathy, without long-term current use of insulin  (HCC) Assessment & Plan: Chronic, most recent A1c inadequately controlled but he  has not  been able to get his Ozempic  for the last 2 months. Ozempic  supply provided to patient in office today from [employer] patient assistance program.  Hold removed from medication list.   Iron  deficiency anemia, unspecified iron  deficiency anemia type Assessment & Plan: Acute, improving on ferrous sulfate  325 mg p.o. daily.   Acute diastolic (congestive) heart failure (HCC) Assessment & Plan: New diagnosis and recent hospitalization, acute Significant improvement in peripheral swelling on Lasix  20 mg daily and spironolactone  25 mg daily. His legs feel more comfortable now that he is wearing compression hose.   Other orders -     Celecoxib ; Take 1 capsule (200 mg total) by mouth 2 (two) times daily. -     Ozempic  (2 MG/DOSE); Inject 2 mg into the skin once a week. Monday     Sample of freestyle [place] given.  Patient picked up Ozempic  from [employer] patient assistance program in office today.  Med list updated with no new prescription sent in to remove hold that was placed on medications in the hospital.  No follow-ups on file.   Greig Ring, MD

## 2023-09-01 NOTE — Assessment & Plan Note (Signed)
 Acute, improving on ferrous sulfate  325 mg p.o. daily.

## 2023-09-01 NOTE — Assessment & Plan Note (Signed)
 New diagnosis Reviewed pathology results with patient.  Reviewed CT scan which did not show any clear metastasis or associated lymphadenopathy. He does have some abdominal discomfort currently. He has follow-up next week with surgeon for recommendations on next step in treatment plan.

## 2023-09-01 NOTE — Assessment & Plan Note (Signed)
 Chronic, most recent A1c inadequately controlled but he has not been able to get his Ozempic  for the last 2 months. Ozempic  supply provided to patient in office today from [employer] patient assistance program.  Hold removed from medication list.

## 2023-09-01 NOTE — Telephone Encounter (Signed)
 Patient picked up Ozempic  while here for OV.

## 2023-09-01 NOTE — Assessment & Plan Note (Signed)
 New diagnosis and recent hospitalization, acute Significant improvement in peripheral swelling on Lasix  20 mg daily and spironolactone  25 mg daily. His legs feel more comfortable now that he is wearing compression hose.

## 2023-09-03 NOTE — Progress Notes (Deleted)
 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    Admitted 08/22/23 with shortness of breath and leg edema due to HF exacerbation due to severe anemia. Hg 7.3. Chest x-ray consistent with pulmonary vascular congestion. BNP elevated. Started on IV lasix  and cardiology consulted. Started on protonix  and GI consulted for anemia. Echo 08/24/23: EF 50-55%, G1DD, normal RV, mild/ moderate MR. Normal EGD 08/25/23. Colonoscopy 08/25/23 with a partially obstructing large mass in the transverse colon, likely malignant, biopsied. Surgery consulted. Chest/ abdomen/ pelvic CT 08/25/23 showing 4mm nodule, 5mm nodule right lung, colon mass.   He presents today for his initial HF visit with a chief complaint of pedal edema. Says that his pedal edema is worsening since his hospital discharge. Sleeping well on 1 pillow. Denies shortness of breath, fatigue, chest pain, palpitations, dizziness.   Has surgical appt 09/06/23 to discuss colon mass biopsy. Said that he just received a phone call saying that it hasn't spread.   Lost his pit bull dog ~ 1 month ago and was devastated. Just received a 6 week [place] Shepherd puppy yesterday. 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    Denies tobacco, alcohol, drug use.   Review of Systems: [y] = yes, [ ]  = no   General: Weight gain [ ] ; Weight loss [ ] ; Anorexia [ ] ; Fatigue [ ] ; Fever [ ] ; Chills [ ] ; Weakness [ ]   Cardiac: Chest pain/pressure [ ] ; Resting SOB [ ] ; Exertional SOB [ ] ; Orthopnea [ ] ; Pedal Edema [name] ]; Palpitations [ ] ; Syncope [ ] ; Presyncope [ ] ; Paroxysmal nocturnal  dyspnea[ ]   Pulmonary: Cough [ ] ; Wheezing[ ] ; Hemoptysis[ ] ; Sputum [ ] ; Snoring [ ]   GI: Vomiting[ ] ; Dysphagia[ ] ; Melena[ ] ; Hematochezia [ ] ; Heartburn[ ] ; Abdominal pain [ ] ; Constipation [ ] ; Diarrhea [ ] ; BRBPR [ ]   GU: Hematuria[ ] ; Dysuria [ ] ; Nocturia[ ]   Vascular: Pain in legs with walking [ ] ; Pain in feet with lying flat [ ] ; Non-healing sores [ ] ; Stroke [ ] ; TIA [ ] ; Slurred speech [ ] ;  Neuro: Headaches[ ] ; Vertigo[ ] ; Seizures[ ] ; Paresthesias[ ] ;Blurred vision [ ] ; Diplopia [ ] ; Vision changes [ ]   Ortho/Skin: Arthritis [ ] ; Joint pain [ ] ; Muscle pain [ ] ; Joint swelling [ ] ; Back Pain [ ] ; Rash [ ]   Psych: Depression[ ] ; Anxiety[ ]   Heme: Bleeding problems [ ] ; Clotting disorders [ ] ; Anemia [name] ]  Endocrine: Diabetes [ ] ; Thyroid  dysfunction[ ]    Past Medical History:  Diagnosis Date   Anemia    Anxiety    Arthritis    Avascular necrosis of bones of both hips (HCC)    Bipolar disorder (HCC)    Bladder spasms    Chronic pain of left knee    Chronic pain syndrome    a.) followed by pain clinic   Cocaine use disorder, moderate, dependence (HCC) 09/09/2015   DDD (degenerative disc disease)    Depression    Erectile dysfunction    a.) on PDE5i (sildenafil)   Frequency-urgency syndrome    GERD (gastroesophageal reflux disease)  Hepatitis C 2018   History of hepatitis B    1983  TX'D--  NO ISSUES OR SYMPTOMS SINCE   Hyperlipidemia    Hypotestosteronemia    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     celecoxib  (CELEBREX )  200 MG capsule Take 1 capsule (200 mg total) by mouth 2 (two) times daily.     Continuous Glucose Sensor (FREESTYLE LIBRE 3 PLUS SENSOR) MISC CHANCE SENSOR EVERY 15 DAYS 2 each 3   cyclobenzaprine  (FLEXERIL ) 10 MG tablet 3 (three) times daily.     empagliflozin  (JARDIANCE ) 25 MG TABS tablet Take 1 tablet (25 mg total) by mouth daily. 90 tablet 1   furosemide  (LASIX ) 20 MG tablet Take 1 tablet (20 mg total) by mouth daily. 90 tablet 1   insulin  aspart (NOVOLOG ) 100 UNIT/ML injection Per sliding scale  MAX 15 units in 24 hour period. 10 mL PRN   iron  polysaccharides (NIFEREX) 150 MG capsule Take 1 capsule (150 mg total) by mouth daily. 30 capsule 0   lidocaine  (LIDODERM ) 5 % Place 3 patches onto the skin daily. Remove & Discard patch within 12 hours or as directed by MD 90 patch 0   lubiprostone  (AMITIZA ) 8 MCG capsule Take 1 capsule (8 mcg total) by mouth 2 (two) times daily with a meal. HAS failed mulitple OTC treatment for constipaiton 60 capsule 0   metFORMIN  (GLUCOPHAGE ) 1000 MG tablet Take 1,000 mg by mouth 2 (two) times daily. (Patient not taking: Reported on 09/01/2023)     ondansetron  (ZOFRAN ) 4 MG tablet Take 1 tablet (4 mg total) by mouth every 8 (eight) hours as needed for nausea or vomiting. (Patient not taking: Reported on 09/01/2023) 20 tablet 1   Oxycodone  HCl 20 MG TABS Take 1 tablet by mouth every 6 (six) hours as needed.     pantoprazole  (PROTONIX ) 40 MG tablet Take 1 tablet (40 mg total) by mouth daily. 90 tablet 3   polyethylene glycol (MIRALAX  / GLYCOLAX ) 17 g packet Take 17 g by mouth daily as needed for mild constipation. 14 each 0   pregabalin  (LYRICA ) 200 MG capsule Take 1 capsule (200 mg total) by mouth in the morning, at noon, and at bedtime. 90 capsule 0   Semaglutide , 2 MG/DOSE, (OZEMPIC , 2 MG/DOSE,) 8 MG/3ML SOPN Inject 2 mg into the skin once a week. Monday     spironolactone  (ALDACTONE ) 25 MG tablet Take 1 tablet (25 mg total) by mouth daily. 90 tablet 3   tadalafil  (CIALIS) 20 MG tablet Take 20 mg by mouth daily as needed for erectile dysfunction.     terbinafine  (LAMISIL ) 250 MG tablet Take 1 tablet (250 mg total) by mouth daily. 90 tablet 0   zolpidem  (AMBIEN ) 10 MG tablet Take 1 tablet (10 mg total) by mouth at bedtime as needed. for sleep 30 tablet 1   No current facility-administered medications for this visit.    Allergies  Allergen Reactions   Suboxone [Buprenorphine Hcl-Naloxone  Hcl]  Nausea And Vomiting   Tape Other (See Comments)    Whelps *Paper Tape is ok    Codeine Rash, Itching and Nausea Only   Morphine  And Codeine Itching and Rash   Other Rash    Telemetry electrodes   Silicone Rash    Whelps -*Paper Tape is ok       Social History   Socioeconomic History   Marital status: Divorced    Spouse name: Not on file   Number of children: 3   Years of education: Not on file   Highest education level: Some college, no degree  Occupational History   Not on file  Tobacco Use   Smoking status: Never   Smokeless tobacco: Never  Vaping Use   Vaping status: Never Used  Substance and Sexual Activity   Alcohol use: Not Currently    Alcohol/week: 0.0 - 2.0 standard drinks of alcohol   Drug use: Not Currently    Types: Cocaine    Comment: last + cocaine UDS 07/2016   Sexual activity: Not Currently  Other Topics Concern   Not on file  Social History Narrative    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   Overall Financial Resource Strain (CARDIA)    Difficulty of Paying Living Expenses: Not hard at all  Food Insecurity: No Food Insecurity (08/31/2023)   Received from [facility]   Hunger Vital Sign    Within the past 12 months, you worried that your food would run out before you got the money to buy more.: Never true    Within the past 12 months, the food you bought just didn't last and you didn't  have money to get more.: Never true  Recent Concern: Food Insecurity - Food Insecurity Present (07/11/2023)   Received from [facility]   Hunger Vital Sign    Within the past 12 months, you worried that your food would run out before you got the money to buy more.: Sometimes true    Within the past 12 months, the food you bought just didn't last and you didn't have money to get more.: Never true  Transportation Needs: No Transportation Needs (08/31/2023)   Received from [facility] - Transportation    In the past 12 months, has lack of transportation kept you from medical appointments or from getting medications?: No    Lack of Transportation (Non-Medical): No  Physical Activity: Inactive (01/28/2022)   Exercise Vital Sign    Days of Exercise per Week: 0 days    Minutes of Exercise per Session: 0 min  Stress: No Stress Concern Present (01/28/2022)   [employer] of Occupational Health - Occupational Stress Questionnaire    Feeling of Stress : Not at all  Social Connections: Moderately Isolated (01/28/2022)   Social Connection and Isolation Panel    Frequency of Communication with Friends and Family: More than three times a week    Frequency of Social Gatherings with Friends and Family: More than three times a week    Attends Religious Services: More than 4 times per year    Active Member of [employer] or Organizations: No    Attends Banker Meetings: Never    Marital Status: Divorced  Catering manager Violence: Not At Risk (08/30/2023)   Humiliation, Afraid, Rape, and Kick questionnaire  Fear of Current or Ex-Partner: No    Emotionally Abused: No    Physically Abused: No    Sexually Abused: No      Family History  Problem Relation Age of Onset   Diabetes Mother    Arthritis Mother    Cancer Mother        lung cancer   Hyperlipidemia Mother    Stroke Mother    Alcohol abuse Father    Cancer Father        lung    Arthritis Father    Diabetes Brother    There were no vitals filed for this visit.  Wt Readings from Last 3 Encounters:  09/01/23 182 lb (82.6 kg)  08/28/23 185 lb 2 oz (84 kg)  08/22/23 180 lb (81.6 kg)   Lab Results  Component Value Date   CREATININE 0.90 08/25/2023   CREATININE 1.00 08/24/2023   CREATININE 1.00 08/23/2023    PHYSICAL EXAM:  General: Well appearing.  Cor: No JVD. Regular rhythm, rate.  Lungs: clear Abdomen: soft, nontender, nondistended. Extremities: 2+ pitting edema bilaterally up to knees Neuro:. Affect pleasant   ECG: not done   ASSESSMENT & PLAN:  1: NICM with preserved ejection fraction- - suspect due to anemia or previous cocaine use - NYHA class I - euvolemic - Echo 08/24/23: EF 50-55%, G1DD, normal RV, mild/ moderate MR - continue jardiance  25mg  daily (DM dose) - begin spironolactone  25mg  daily - begin furosemide  20mg  daily - wear compression socks daily with removal at bedtime - reviewed sodium / fluid intake and parameters to keep under - work note keeping him out until seen next week - BMET next week - BMET 08/25/23 reviewed: sodium 137, potassium 4.3, creatinine 0.9 & GFR >60 - BNP 08/22/23 was 1782.6  2: T2DM- - A1c 07/18/23 was 7.4% - saw PCP Myrna) 08/25 - continue insulin / jardiance   3: Hyperlipidemia- - LDL 07/18/23 was 54  4: Anemia due to newly diagnosed colon mass- - Colonoscopy 08/25/23 with a partially obstructing large mass in the transverse colon, likely malignant, biopsied. Surgery consulted.  - Chest/ abdomen/ pelvic CT 08/25/23 showing 4mm nodule left lung fissure, 5mm nodule right lung fissure favored benign, colon mass. (Per report) - patient says that he sees surgeon next week to discuss further treatment and get biopsy results - Hg 08/25/23 was 8.4   Return in 1 week, sooner if needed.   Ellouise DELENA Class, FNP 09/03/23

## 2023-09-04 ENCOUNTER — Other Ambulatory Visit
Admission: RE | Admit: 2023-09-04 | Discharge: 2023-09-04 | Disposition: A | Discharge status: Home / Self Care | Source: Ambulatory Visit | Attending: Family | Admitting: Family

## 2023-09-04 ENCOUNTER — Encounter: Payer: Self-pay | Admitting: Family

## 2023-09-04 ENCOUNTER — Encounter: Admitting: Family

## 2023-09-04 ENCOUNTER — Ambulatory Visit (HOSPITAL_BASED_OUTPATIENT_CLINIC_OR_DEPARTMENT_OTHER): Admitting: Family

## 2023-09-04 VITALS — BP 139/80 | HR 100 | Wt 189.2 lb

## 2023-09-04 DIAGNOSIS — C19 Malignant neoplasm of rectosigmoid junction: Secondary | ICD-10-CM | POA: Diagnosis not present

## 2023-09-04 DIAGNOSIS — E782 Mixed hyperlipidemia: Secondary | ICD-10-CM | POA: Diagnosis not present

## 2023-09-04 DIAGNOSIS — Z794 Long term (current) use of insulin: Secondary | ICD-10-CM

## 2023-09-04 DIAGNOSIS — I5032 Chronic diastolic (congestive) heart failure: Secondary | ICD-10-CM | POA: Diagnosis not present

## 2023-09-04 DIAGNOSIS — E119 Type 2 diabetes mellitus without complications: Secondary | ICD-10-CM | POA: Diagnosis not present

## 2023-09-04 LAB — CBC
HCT: 25.5 % — ABNORMAL LOW (ref 39.0–52.0)
Hemoglobin: 7.3 g/dL — ABNORMAL LOW (ref 13.0–17.0)
MCH: 19.9 pg — ABNORMAL LOW (ref 26.0–34.0)
MCHC: 28.6 g/dL — ABNORMAL LOW (ref 30.0–36.0)
MCV: 69.7 fL — ABNORMAL LOW (ref 80.0–100.0)
Platelets: 261 K/uL (ref 150–400)
RBC: 3.66 MIL/uL — ABNORMAL LOW (ref 4.22–5.81)
RDW: 23 % — ABNORMAL HIGH (ref 11.5–15.5)
WBC: 6.4 K/uL (ref 4.0–10.5)
nRBC: 0 % (ref 0.0–0.2)

## 2023-09-04 LAB — BASIC METABOLIC PANEL WITH GFR
Anion gap: 8 (ref 5–15)
BUN: 22 mg/dL (ref 8–23)
CO2: 24 mmol/L (ref 22–32)
Calcium: 8.3 mg/dL — ABNORMAL LOW (ref 8.9–10.3)
Chloride: 102 mmol/L (ref 98–111)
Creatinine, Ser: 1.21 mg/dL (ref 0.61–1.24)
GFR, Estimated: >60 mL/min (ref >60)
Glucose, Bld: 145 mg/dL — ABNORMAL HIGH (ref 70–99)
Potassium: 3.6 mmol/L (ref 3.5–5.1)
Sodium: 134 mmol/L — ABNORMAL LOW (ref 135–145)

## 2023-09-04 LAB — BRAIN NATRIURETIC PEPTIDE: B Natriuretic Peptide: 1648 pg/mL — ABNORMAL HIGH (ref 0.0–100.0)

## 2023-09-04 MED ORDER — METOLAZONE 2.5 MG PO TABS
ORAL_TABLET | ORAL | 1 refills | Status: AC
Start: 2023-09-04 — End: ?

## 2023-09-04 MED ORDER — FUROSEMIDE 20 MG PO TABS
60.0000 mg | ORAL_TABLET | Freq: Every day | ORAL | 5 refills | Status: DC
Start: 2023-09-04 — End: 2023-11-07

## 2023-09-04 NOTE — Patient Instructions (Signed)
 Medication Changes:  INCREASE LASIX  TO 60 MG ONCE DAILY  TAKE METOLAZONE  2.5 MG TUESDAY, WEDNESDAY, THURSDAY 1/2 HOUR BEFORE YOU TAKE YOUR LASIX .   Lab Work:  Go over to the [facility]. Go pass the gift shop and have your blood work completed.  We will only call you if the results are abnormal or if the provider would like to make medication changes.  No news is good news.    Follow-Up in: Monday, September 1st at 2:30 PM.   Thank you for choosing [facility].    At the [facility], you and your health needs are our priority. We have a designated team specialized in the treatment of Heart Failure. This Care Team includes your primary Heart Failure Specialized Cardiologist (physician), Advanced Practice Providers (APPs- Physician Assistants and Nurse Practitioners), and Pharmacist who all work together to provide you with the care you need, when you need it.   You may see any of the following providers on your designated Care Team at your next follow up:  Dr. Toribio Fuel Dr. Ezra Shuck Dr. Ria Commander Dr. Morene Brownie Ellouise Class, FNP Jaun Bash, RPH-CPP  Please be sure to bring in all your medications bottles to every appointment.   Need to Contact Us :  If you have any questions or concerns before your next appointment please send us  a message through [place] or call our office at [phone].    TO LEAVE A MESSAGE FOR THE NURSE SELECT OPTION 2, PLEASE LEAVE A MESSAGE INCLUDING: YOUR NAME DATE OF BIRTH CALL BACK NUMBER REASON FOR CALL**this is important as we prioritize the call backs  YOU WILL RECEIVE A CALL BACK THE SAME DAY AS LONG AS YOU CALL BEFORE 4:00 PM

## 2023-09-04 NOTE — Progress Notes (Signed)
 ReDS Vest / Clip - 09/04/23 1427       ReDS Vest / Clip   Station Marker C    Ruler Value 30    ReDS Value Range High volume overload    ReDS Actual Value 47

## 2023-09-04 NOTE — Progress Notes (Signed)
 Advanced Heart Failure Clinic Note   Referring Physician: 08/25 admission PCP: [name], MD Cardiologist: None   Chief Complaint: shortness of breath   HPI:  Erik Martinez is a 68 y/o male with a history of HFpEF (08/25), T2DM, chronic pain, hyperlipidemia, anemia, colon mass (08/25), bipolar d/o, GERD, Hep B/C s/p treatment, previous cocaine use and bladder stimulator due to nerve injury from back surgery.    Admitted 08/22/23 with shortness of breath and leg edema due to HF exacerbation due to severe anemia. Hg 7.3. Chest x-ray consistent with pulmonary vascular congestion. BNP elevated. Started on IV lasix  and cardiology consulted. Started on protonix  and GI consulted for anemia. Echo 08/24/23: EF 50-55%, G1DD, normal RV, mild/ moderate Erik. Normal EGD 08/25/23. Colonoscopy 08/25/23 with a partially obstructing large mass in the transverse colon, likely malignant, biopsied. Surgery consulted. Chest/ abdomen/ pelvic CT 08/25/23 showing 4mm nodule, 5mm nodule right lung, colon mass.   Seen in [facility] 08/28/23 where furosemide  20mg  daily and spironolactone  25mg  daily were started.   He presents today for a HF follow-up visit with a chief complaint of shortness of breath. He says that he feels like his SOB has worsened since he was here last week. Has associated fatigue, occasional chest tightness, weight gain, worsening pedal edema and abdominal distention. Ended up having to work last week instead of taking the week off but says that he felt fine while working. Wearing compression socks but feels like the edema is now more in his thighs. Doesn't feel like he's urinating any more with the lasix / spironolactone  than he was previously.   Has surgical appt 09/06/23 to discuss colon mass biopsy.   Lost his pit bull dog ~ 1 month ago and was devastated. Received a 6 week [place] Shepherd puppy 08/27/23. Does automobile paint/ body work.   Denies tobacco, alcohol, drug use.   ROS: All systems negative  except what is listed in HPI, PMH and Problem List   Past Medical History:  Diagnosis Date   Anemia    Anxiety    Arthritis    Avascular necrosis of bones of both hips (HCC)    Bipolar disorder (HCC)    Bladder spasms    Chronic pain of left knee    Chronic pain syndrome    a.) followed by pain clinic   Cocaine use disorder, moderate, dependence (HCC) 09/09/2015   DDD (degenerative disc disease)    Depression    Erectile dysfunction    a.) on PDE5i (sildenafil)   Frequency-urgency syndrome    GERD (gastroesophageal reflux disease)    Hepatitis C 2018   History of hepatitis B    1983  TX'D--  NO ISSUES OR SYMPTOMS SINCE   Hyperlipidemia    Hypotestosteronemia    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     celecoxib  (CELEBREX ) 200 MG capsule Take 1 capsule (200 mg total) by mouth 2 (two) times daily.     Continuous Glucose Sensor (  FREESTYLE LIBRE 3 PLUS SENSOR) MISC CHANCE SENSOR EVERY 15 DAYS 2 each 3   cyclobenzaprine  (FLEXERIL ) 10 MG tablet 3 (three) times daily.     empagliflozin  (JARDIANCE ) 25 MG TABS tablet Take 1 tablet (25 mg total) by mouth daily. 90 tablet 1   furosemide  (LASIX ) 20 MG tablet Take 1 tablet (20 mg total) by mouth daily. 90 tablet 1   insulin  aspart (NOVOLOG ) 100 UNIT/ML injection Per sliding scale  MAX 15 units in 24 hour period. 10 mL PRN   iron  polysaccharides (NIFEREX) 150 MG capsule Take 1 capsule (150 mg total) by mouth daily. 30 capsule 0   lidocaine  (LIDODERM ) 5 % Place 3 patches onto the skin daily. Remove &  Discard patch within 12 hours or as directed by MD 90 patch 0   lubiprostone  (AMITIZA ) 8 MCG capsule Take 1 capsule (8 mcg total) by mouth 2 (two) times daily with a meal. HAS failed mulitple OTC treatment for constipaiton 60 capsule 0   Oxycodone  HCl 20 MG TABS Take 1 tablet by mouth every 6 (six) hours as needed.     pantoprazole  (PROTONIX ) 40 MG tablet Take 1 tablet (40 mg total) by mouth daily. 90 tablet 3   polyethylene glycol (MIRALAX  / GLYCOLAX ) 17 g packet Take 17 g by mouth daily as needed for mild constipation. 14 each 0   pregabalin  (LYRICA ) 200 MG capsule Take 1 capsule (200 mg total) by mouth in the morning, at noon, and at bedtime. 90 capsule 0   Semaglutide , 2 MG/DOSE, (OZEMPIC , 2 MG/DOSE,) 8 MG/3ML SOPN Inject 2 mg into the skin once a week. Monday     spironolactone  (ALDACTONE ) 25 MG tablet Take 1 tablet (25 mg total) by mouth daily. 90 tablet 3   tadalafil (CIALIS) 20 MG tablet Take 20 mg by mouth daily as needed for erectile dysfunction.     terbinafine  (LAMISIL ) 250 MG tablet Take 1 tablet (250 mg total) by mouth daily. 90 tablet 0   zolpidem  (AMBIEN ) 10 MG tablet Take 1 tablet (10 mg total) by mouth at bedtime as needed. for sleep 30 tablet 1   metFORMIN  (GLUCOPHAGE ) 1000 MG tablet Take 1,000 mg by mouth 2 (two) times daily. (Patient not taking: Reported on 09/01/2023)     ondansetron  (ZOFRAN ) 4 MG tablet Take 1 tablet (4 mg total) by mouth every 8 (eight) hours as needed for nausea or vomiting. (Patient not taking: Reported on 09/01/2023) 20 tablet 1   No current facility-administered medications for this visit.    Allergies  Allergen Reactions   Suboxone [Buprenorphine Hcl-Naloxone  Hcl] Nausea And Vomiting   Tape Other (See Comments)    Whelps *Paper Tape is ok    Codeine Rash, Itching and Nausea Only   Morphine  And Codeine Itching and Rash   Other Rash    Telemetry electrodes   Silicone Rash    Whelps -*Paper Tape is ok       Social History   Socioeconomic  History   Marital status: Divorced    Spouse name: Not on file   Number of children: 3   Years of education: Not on file   Highest education level: Some college, no degree  Occupational History   Not on file  Tobacco Use   Smoking status: Never   Smokeless tobacco: Never  Vaping Use   Vaping status: Never Used  Substance and Sexual Activity   Alcohol use: Not Currently    Alcohol/week: 0.0 - 2.0 standard drinks of alcohol  Drug use: Not Currently    Types: Cocaine    Comment: last + cocaine UDS 07/2016   Sexual activity: Not Currently  Other Topics Concern   Not on file  Social History Narrative    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   Overall Financial Resource Strain (CARDIA)    Difficulty of Paying Living Expenses: Not hard at all  Food Insecurity: No Food Insecurity (08/31/2023)   Received from [facility]   Hunger Vital Sign    Within the past 12 months, you worried that your food would run out before you got the money to buy more.: Never true    Within the past 12 months, the food you bought just didn't last and you didn't have money to get more.: Never true  Recent Concern: Food Insecurity - Food Insecurity Present (07/11/2023)   Received from [facility]   Hunger Vital Sign    Within the past 12 months, you worried that your food would run out before you got the money to buy more.: Sometimes true    Within the past 12 months, the food you bought just didn't last and you didn't have money to get more.: Never true  Transportation Needs: No Transportation Needs (08/31/2023)   Received from [facility] - Transportation    In the past 12 months, has lack of transportation kept you from medical appointments or from getting medications?: No    Lack of Transportation (Non-Medical): No  Physical  Activity: Inactive (01/28/2022)   Exercise Vital Sign    Days of Exercise per Week: 0 days    Minutes of Exercise per Session: 0 min  Stress: No Stress Concern Present (01/28/2022)   [employer] of Occupational Health - Occupational Stress Questionnaire    Feeling of Stress : Not at all  Social Connections: Moderately Isolated (01/28/2022)   Social Connection and Isolation Panel    Frequency of Communication with Friends and Family: More than three times a week    Frequency of Social Gatherings with Friends and Family: More than three times a week    Attends Religious Services: More than 4 times per year    Active Member of [employer] or Organizations: No    Attends Banker Meetings: Never    Marital Status: Divorced  Catering manager Violence: Not At Risk (08/30/2023)   Humiliation, Afraid, Rape, and Kick questionnaire    Fear of Current or Ex-Partner: No    Emotionally Abused: No    Physically Abused: No    Sexually Abused: No      Family History  Problem Relation Age of Onset   Diabetes Mother    Arthritis Mother    Cancer Mother        lung cancer   Hyperlipidemia Mother    Stroke Mother    Alcohol abuse Father    Cancer Father        lung   Arthritis Father    Diabetes Brother    Vitals:   09/04/23 1427  BP: 139/80  Pulse: 100  SpO2: 100%  Weight: 189 lb 4 oz (85.8 kg)   Wt Readings from Last 3 Encounters:  09/04/23 189 lb 4 oz (85.8 kg)  09/01/23 182 lb (82.6 kg)  08/28/23 185 lb 2 oz (84 kg)   Lab Results  Component Value Date   CREATININE 0.90 08/25/2023   CREATININE 1.00 08/24/2023   CREATININE 1.00 08/23/2023    PHYSICAL EXAM:  General: Well appearing.  Cor: Elevated JVD. Regular rhythm, tachycardic.  Lungs: clear Abdomen: soft, nontender, distended. Extremities: 3+ pitting edema bilaterally above knees. 1+ bilateral lower legs with current compression socks Neuro:. Affect pleasant   ECG: not done  ReDs reading: 47 %, abnormal  (see plan below)    ASSESSMENT & PLAN:  1: NICM with preserved ejection fraction- - suspect due to anemia or previous cocaine use - NYHA class III - moderately fluid up with weight gain, elevated ReDs and worsening symptoms - weight up 4 pounds from last visit 1 week ago - Echo 08/24/23: EF 50-55%, G1DD, normal RV, mild/ moderate Erik - ReDs today is 47%. Discussed IV lasix  vs metolazone  and he opts for metolazone  due to his new puppy in the crate at home - increase furosemide  to 60mg  daily - Add metolazone  2.5mg  daily X 3 days - BMET/ BNP today - If symptoms aren't improving in a few days, he was instructed to call and we can schedule him for IV lasix  at that time.  - continue jardiance  25mg  daily (DM dose) - continue spironolactone  25mg  daily - continue compression socks - reviewed sodium / fluid intake and parameters to keep under - out of work note provided till return visit in 1 week - BMET 08/25/23 reviewed: sodium 137, potassium 4.3, creatinine 0.9 & GFR >60 - BNP 08/22/23 was 1782.6  2: T2DM- - A1c 07/18/23 was 7.4% - saw PCP Myrna) 08/25 - continue insulin / jardiance   3: Hyperlipidemia- - LDL 07/18/23 was 54  4: Anemia due to newly diagnosed colon mass- - Colonoscopy 08/25/23 with a partially obstructing large mass in the transverse colon, likely malignant, biopsied. Surgery consulted.  - Chest/ abdomen/ pelvic CT 08/25/23 showing 4mm nodule left lung fissure, 5mm nodule right lung fissure favored benign, colon mass. (Per report) - patient says that he sees surgeon next week to discuss further treatment and get biopsy results - Hg 08/25/23 was 8.4 - CBC today   Return in 1 week, sooner if needed.   Ellouise DELENA Class, FNP 09/04/23

## 2023-09-05 ENCOUNTER — Ambulatory Visit: Payer: Self-pay | Admitting: Family

## 2023-09-05 ENCOUNTER — Other Ambulatory Visit: Payer: Self-pay | Admitting: Family Medicine

## 2023-09-05 ENCOUNTER — Telehealth: Payer: Self-pay | Admitting: Family Medicine

## 2023-09-05 NOTE — Telephone Encounter (Signed)
 Copied from CRM [id number]. Topic: Clinical - Medication Refill >> Sep 05, 2023  3:48 PM Sasha M wrote: Medication: lubiprostone  (AMITIZA ) 8 MCG capsule  Has the patient contacted their pharmacy? Yes (Agent: If no, request that the patient contact the pharmacy for the refill. If patient does not wish to contact the pharmacy document the reason why and proceed with request.) (Agent: If yes, when and what did the pharmacy advise?) Pharmacy states they already requested from provider  This is the patient's preferred pharmacy:  [facility] - [place], [place] - [name] [address] Phone: [phone] Fax: [phone]  Is this the correct pharmacy for this prescription? Yes If no, delete pharmacy and type the correct one.   Has the prescription been filled recently? No  Is the patient out of the medication? No  Has the patient been seen for an appointment in the last year OR does the patient have an upcoming appointment? Yes  Can we respond through MyChart? No  Agent: Please be advised that Rx refills may take up to 3 business days. We ask that you follow-up with your pharmacy.

## 2023-09-05 NOTE — Telephone Encounter (Signed)
 Last office visit 09/01/23 for hospital follow up.  Last refilled 08/18/23 for #60 with no refills.  Next Appt: No future appointments with PCP.

## 2023-09-06 ENCOUNTER — Ambulatory Visit: Payer: Self-pay | Admitting: Surgery

## 2023-09-06 DIAGNOSIS — C183 Malignant neoplasm of hepatic flexure: Secondary | ICD-10-CM | POA: Diagnosis not present

## 2023-09-06 NOTE — H&P (View-Only) (Signed)
 Subjective:   CC: Malignant neoplasm of hepatic flexure (CMS/HHS-HCC) [C18.3]  HPI: returns for evaluation of above.  History of Present Illness    Past Medical History:  has a past medical history of CAD (coronary artery disease), Chronic pain, COPD (chronic obstructive pulmonary disease) (CMS/HHS-HCC), Depression, Diabetes mellitus type 2, uncomplicated (CMS/HHS-HCC), Fatty liver, GERD (gastroesophageal reflux disease), Hepatitis C, History of chicken pox, History of stroke, Insomnia, and Vascular abnormality.  Past Surgical History:  has a past surgical history that includes Incision Tendon Sheath For Trigger Finger (Right, 09/26/2003); Incision Tendon Sheath For Trigger Finger (Left, 09/17/2003); Arthroscopic Rotator Cuff Repair (1999); Left knee unicompartmental arthroplasty (06/18/2002); Lumbar disc replacement at L5-S1; Lumbar interbody fusion at L4-5; Colonoscopy (02/16/2006); Right index and ring trigger finger release  (Right, 06/18/2014); Conversion of left hip hemiarthroplasty to total hip arthroplasty (12/18/2001); Manipulation under anesthesia - left knee (09/10/2002); Left knee arthroscopy (2005); Revision of unicompartmental arthroplasty to left total knee arthroplasty replacement (04/08/2003); Open debridement of the left total knee including removal of old sutures and a significant scar debridement and debridement of bony overgrowth (05/01/2007); Revision left total knee replacement, exchange all components (08/13/2009); bladder stimulator; Left knee revision arthroplasty (femoral and polyethylene components) Extensive synovectomy and excision of fibrotic tissue from the left knee  (07/19/2021); Irrigation and debridement of the left knee, polyethylene exchange, and placement of antibiotic impregnated Stimulan beads (09/11/2021);  Extensive left knee synovectomy, removal of total knee implants, placement of antibiotic impregnated methylmethacrylate articulating spacer, placement of  Stimulan antibiotic beads/bullets (Left, 04/01/2022); and Removal of antibiotic impregnated articulating cement spacer, irrigation and debridement, and left total knee revision arthroplasty (06/01/2022).  Family History: family history includes Cancer in his father and mother; Diabetes type II in his mother; Osteoporosis (Thinning of bones) in his mother.  Social History:  reports that he has never smoked. He has never used smokeless tobacco. He reports that he does not currently use alcohol. He reports that he does not use drugs.  Current Medications: has a current medication list which includes the following prescription(s): atorvastatin , celecoxib , clotrimazole -betamethasone , cyclobenzaprine , furosemide , jardiance , lidocaine , lidocaine -prilocaine, lubiprostone , metformin , methyl salicylate-menthol , novolog  u-100 insulin  aspart, omeprazole , oxycodone , pregabalin , ozempic , sildenafil, terbinafine  hcl, triamcinolone , and zolpidem .  Allergies:  Allergies  Allergen Reactions  . Buprenorphine-Naloxone  Hives and Nausea And Vomiting  . Adhesive Tape-Silicones Rash    Whelps -*Paper Tape is ok    . Codeine Itching, Nausea And Vomiting and Rash  . Morphine  Itching, Nausea And Vomiting and Rash    ROS:  A 15 point review of systems was performed and pertinent positives and negatives noted in HPI   Objective:     BP 114/68   Pulse 101   Ht 175.3 cm (5' 9)   Wt 79.4 kg (175 lb)   BMI 25.84 kg/m   Constitutional :  No distress, cooperative, alert  Lymphatics/Throat:  Supple with no lymphadenopathy  Respiratory:  Clear to auscultation bilaterally  Cardiovascular:  Regular rate and rhythm  Gastrointestinal: Soft, non-tender, non-distended, no organomegaly.  Musculoskeletal: Steady gait and movement  Skin: Cool and moist  Psychiatric: Normal affect, non-agitated, not confused       LABS:  CEA- 6.6 SURGICAL PATHOLOGY SURGICAL PATHOLOGY [facility] [address],  [address] [place], [place] [address] Telephone [phone] or [phone] Fax [phone]  REPORT OF SURGICAL PATHOLOGY   Accession #: [id number] Patient Name: Erik Martinez, Erik Martinez Visit # : [id number]  MRN: [id number] Physician: [name] DOB/Age 17-Nov-1955 (Age: 68) Gender: M  Collected Date: 08/25/2023 Received Date: 08/25/2023  FINAL DIAGNOSIS       1. Transverse Colon Biopsy, cbx mass :      INVASIVE ADENOCARCINOMA, MODERATELY DIFFERENTIATED.      SEE NOTE.       Diagnosis Note : The case was peer-reviewed by Dr. Reed who agrees with      the diagnosis.      DATE SIGNED OUT: 08/28/2023 ELECTRONIC SIGNATURE : Pepper Dutton Md, Pathologist, Electronic Signature  MICROSCOPIC DESCRIPTION  CASE COMMENTS STAINS USED IN DIAGNOSIS: H&E Universal Negative Control-DAB MLH1 MSH2 MSH6 PMS2    CLINICAL HISTORY  SPECIMEN(S) OBTAINED 1. Transverse Colon Biopsy, Cbx Mass  SPECIMEN COMMENTS: SPECIMEN CLINICAL INFORMATION: 1. Iron  deficiency anemia.  Transverse colon mass    Gross Description 1. Received in formalin are tan, soft tissue fragments that are submitted in toto.  Number:  5,   Size:  0.1-0.4 cm,  1 block. mb 08-25-23        Report signed out from the following location(s) [place]. [facility] [address] [place], [place] [address] CLIA #: [id number]  [facility] [address] [place], [place] [address] CLIA #: [id number]    RADS: CLINICAL DATA:  Colorectal carcinoma staging. Anemia and transverse  colon mass discovered on recent colonoscopy. * Tracking Code: BO *   EXAM:  CT CHEST, ABDOMEN, AND PELVIS WITH CONTRAST   TECHNIQUE:  Multidetector CT imaging of the chest, abdomen and pelvis was  performed following the standard protocol during bolus  administration of intravenous contrast.   RADIATION DOSE REDUCTION: This exam was performed according to the  departmental dose-optimization program which  includes automated  exposure control, adjustment of the mA and/or kV according to  patient size and/or use of iterative reconstruction technique.   CONTRAST:  OMNIPAQUE  IOHEXOL  300 MG/ML  SOLN   COMPARISON:  None Available.   FINDINGS:  CT CHEST FINDINGS   Cardiovascular: No significant vascular findings. Normal heart size.  No pericardial effusion.   Mediastinum/Nodes: No axillary or supraclavicular adenopathy. No  mediastinal or hilar adenopathy. No pericardial fluid. Esophagus  normal.   Lungs/Pleura: Small nodule along the LEFT oblique fissure measures 4  mm on image 79/4. Similar small nodule along the RIGHT oblique  fissure measures 5 mm on image 77.   Musculoskeletal: No aggressive osseous lesion.   CT ABDOMEN AND PELVIS FINDINGS   Hepatobiliary: No focal hepatic lesion. No biliary ductal  dilatation. Gallbladder is normal. Common bile duct is normal.   Pancreas: Pancreas is normal. No ductal dilatation. No pancreatic  inflammation.   Spleen: Normal spleen   Adrenals/urinary tract: Adrenal glands normal. Simple fluid  attenuation cyst extends from the posterior aspect of the RIGHT  kidney measuring 24 mm. Bladder normal.   Stomach/Bowel: Stomach, duodenum and small bowel normal. Appendix  not identified. Circumferential luminal narrowing of the colon at  the hepatic flexure. This narrowing current are proximally 2.6 cm  segment (image 71/series 2) no evidence of high-grade proximal  obstruction. Mild postobstructive dilatation of the colon.   The transverse colon is gas-filled. Descending colon is relatively  collapsed. There is stool in the rectosigmoid colon.   No mesenteric lymphadenopathy. No periportal adenopathy. No  retroperitoneal adenopathy   Vascular/Lymphatic: Abdominal aorta is normal caliber with  atherosclerotic calcification. There is no retroperitoneal or  periportal lymphadenopathy. No pelvic lymphadenopathy.   Reproductive:  Unremarkable   Other: No free fluid.   Musculoskeletal: Posterior lumbar fusion. No  aggressive osseous  lesion.   IMPRESSION:  CHEST:   1. No evidence of thoracic metastasis.  2. Small nodules along the oblique fissures are favored benign.   PELVIS:   1. Circumferential luminal narrowing of the colon at the hepatic  flexure consistent with primary colonic neoplasm.  2. No evidence of metastatic adenopathy in the abdomen pelvis.  3. No evidence of liver metastasis.  4.  Aortic Atherosclerosis (ICD10-I70.0).    Electronically Signed    By: Jackquline Boxer M.D.    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  The risk of laparoscopic colon resection surgery includes, but not limited to, recurrence, bleeding, chronic pain, post-op infxn, post-op SBO or ileus, hernias, resection of bowel, re-anastamosis, possible ostomy placement and need for re-operation to address said risks. The risks of general anesthetic, if used, includes MI, CVA, sudden death or even reaction to anesthetic medications also discussed. Alternatives include continued observation.  Benefits include possible symptom relief, preventing further decline in health and possible death.   Typical post-op recovery time of additional days in hospital for observation afterwards also discussed.   Prep ordered.  Will proceed with ERAS protocol as well.  Pending medical clearance and workup as noted above.     The patient verbalized understanding and all questions were answered to the patient's satisfaction.  labs/images/medications/previous chart entries reviewed personally and relevant changes/updates noted above.

## 2023-09-06 NOTE — H&P (Signed)
 SURGICAL PATHOLOGY SURGICAL PATHOLOGY [facility] [address] [place], [place] [address] Telephone [phone] or [phone] Fax [phone]  REPORT OF SURGICAL PATHOLOGY   Accession #: [id number] Patient Name: Erik Martinez, Erik Martinez Visit # : [id number]  MRN: [id number] Physician: [name] DOB/Age 67-Sep-1957 (Age: 89) Gender: M Collected Date: 08/25/2023 Received Date: 08/25/2023  FINAL DIAGNOSIS       1. Transverse Colon Biopsy, cbx mass :      INVASIVE ADENOCARCINOMA, MODERATELY DIFFERENTIATED.      SEE NOTE.       Diagnosis Note : The case was peer-reviewed by Dr. Reed who agrees with      the diagnosis.      DATE SIGNED OUT: 08/28/2023 ELECTRONIC SIGNATURE : Pepper Dutton Md, Pathologist, Electronic Signature  MICROSCOPIC DESCRIPTION  CASE COMMENTS STAINS USED IN DIAGNOSIS: H&E Universal Negative Control-DAB MLH1 MSH2 MSH6 PMS2    CLINICAL HISTORY  SPECIMEN(S) OBTAINED 1. Transverse Colon Biopsy, Cbx Mass  SPECIMEN COMMENTS: SPECIMEN CLINICAL INFORMATION: 1. Iron  deficiency anemia.  Transverse colon mass    Gross Description 1. Received in formalin are tan, soft tissue fragments that are submitted in toto.  Number:  5,   Size:  0.1-0.4 cm,  1 block. mb 08-25-23        Report signed out from the following location(s) [place]. [facility] [address] [place], [place] [address] CLIA #: [id number]  [facility] [address] [place], [place] [address] CLIA #: [id number]

## 2023-09-07 DIAGNOSIS — M5136 Other intervertebral disc degeneration, lumbar region with discogenic back pain only: Secondary | ICD-10-CM | POA: Diagnosis not present

## 2023-09-07 DIAGNOSIS — M545 Low back pain, unspecified: Secondary | ICD-10-CM | POA: Diagnosis not present

## 2023-09-07 DIAGNOSIS — M25512 Pain in left shoulder: Secondary | ICD-10-CM | POA: Diagnosis not present

## 2023-09-08 ENCOUNTER — Encounter
Admission: RE | Admit: 2023-09-08 | Discharge: 2023-09-08 | Disposition: A | Discharge status: Home / Self Care | Source: Ambulatory Visit | Attending: Surgery | Admitting: Surgery

## 2023-09-08 ENCOUNTER — Other Ambulatory Visit: Payer: Self-pay

## 2023-09-08 ENCOUNTER — Encounter: Payer: Self-pay | Admitting: Surgery

## 2023-09-08 ENCOUNTER — Telehealth: Payer: Self-pay | Admitting: Family

## 2023-09-08 VITALS — BP 129/66 | HR 97 | Temp 98.2°F | Resp 17

## 2023-09-08 DIAGNOSIS — Z01812 Encounter for preprocedural laboratory examination: Secondary | ICD-10-CM | POA: Insufficient documentation

## 2023-09-08 DIAGNOSIS — D490 Neoplasm of unspecified behavior of digestive system: Secondary | ICD-10-CM | POA: Insufficient documentation

## 2023-09-08 DIAGNOSIS — D649 Anemia, unspecified: Secondary | ICD-10-CM | POA: Diagnosis not present

## 2023-09-08 LAB — CBC
HCT: 28.5 % — ABNORMAL LOW (ref 39.0–52.0)
Hemoglobin: 8.8 g/dL — ABNORMAL LOW (ref 13.0–17.0)
MCH: 20.2 pg — ABNORMAL LOW (ref 26.0–34.0)
MCHC: 30.9 g/dL (ref 30.0–36.0)
MCV: 65.5 fL — ABNORMAL LOW (ref 80.0–100.0)
Platelets: 397 K/uL (ref 150–400)
RBC: 4.35 MIL/uL (ref 4.22–5.81)
RDW: 21.3 % — ABNORMAL HIGH (ref 11.5–15.5)
WBC: 5.5 K/uL (ref 4.0–10.5)
nRBC: 0 % (ref 0.0–0.2)

## 2023-09-08 NOTE — Patient Instructions (Addendum)
 Your procedure is scheduled on: [facility] 09/13/23 Report to the Registration Desk on the 1st floor of the [facility]. To find out your arrival time, please call [phone] between 1PM - 3PM on: TUESDAY 09/12/23 If your arrival time is 6:00 am, do not arrive before that time as the [facility] entrance doors do not open until 6:00 am.  REMEMBER: Instructions that are not followed completely may result in serious medical risk, up to and including death; or upon the discretion of your surgeon and anesthesiologist your surgery may need to be rescheduled.  Do not eat food after midnight the night before surgery.  No gum chewing or hard candies.  You may however, drink WATER  up to 2 hours before you are scheduled to arrive for your surgery. Do not drink anything within 2 hours of your scheduled arrival time.  One week prior to surgery: Stop Anti-inflammatories (NSAIDS) such as Advil, Aleve, Ibuprofen, Motrin, Naproxen, Naprosyn and Aspirin  based products such as Excedrin, Goody's Powder, BC Powder.  Stop ANY OVER THE COUNTER supplements until after surgery.  Stop Semaglutide  until after surgery.  Stop empagliflozin  (JARDIANCE ) 3 days prior to surgery. Last dose 09/10/23  You may however, continue to take Tylenol  if needed for pain up until the day of surgery.  Continue taking all of your other prescription medications up until the day of surgery.  ON THE DAY OF SURGERY ONLY TAKE THESE MEDICATIONS WITH SIPS OF WATER :  Oxycodone   pantoprazole  (PROTONIX )  pregabalin  (LYRICA )   Use inhalers on the day of surgery and bring to the hospital.  Fleets enema or bowel prep as directed.  No Alcohol for 24 hours before or after surgery.  No Smoking including e-cigarettes for 24 hours before surgery.  No chewable tobacco products for at least 6 hours before surgery.  No nicotine patches on the day of surgery.  Do not use any recreational drugs for at least a week (preferably 2 weeks)  before your surgery.  Please be advised that the combination of cocaine and anesthesia may have negative outcomes, up to and including death. If you test positive for cocaine, your surgery will be cancelled.  On the morning of surgery brush your teeth with toothpaste and water , you may rinse your mouth with mouthwash if you wish. Do not swallow any toothpaste or mouthwash.  Use CHG Soap or wipes as directed on instruction sheet.  Do not wear jewelry, make-up, hairpins, clips or nail polish.  For welded (permanent) jewelry: bracelets, anklets, waist bands, etc.  Please have this removed prior to surgery.  If it is not removed, there is a chance that hospital personnel will need to cut it off on the day of surgery.  Do not wear lotions, powders, or perfumes.   Do not shave body hair from the neck down 48 hours before surgery.  Contact lenses, hearing aids and dentures may not be worn into surgery.  Do not bring valuables to the hospital. [facility] is not responsible for any missing/lost belongings or valuables.   Bring your C-PAP to the hospital in case you may have to spend the night.   Notify your doctor if there is any change in your medical condition (cold, fever, infection).  Wear comfortable clothing (specific to your surgery type) to the hospital.  After surgery, you can help prevent lung complications by doing breathing exercises.  Take deep breaths and cough every 1-2 hours. Your doctor may order a device called an Incentive Spirometer to help you  take deep breaths. When coughing or sneezing, hold a pillow firmly against your incision with both hands. This is called "splinting." Doing this helps protect your incision. It also decreases belly discomfort.  If you are being admitted to the hospital overnight, leave your suitcase in the car. After surgery it may be brought to your room.  In case of increased patient census, it may be necessary for you, the patient, to continue  your postoperative care in the Same Day Surgery department.  If you are being discharged the day of surgery, you will not be allowed to drive home. You will need a responsible individual to drive you home and stay with you for 24 hours after surgery.   If you are taking public transportation, you will need to have a responsible individual with you.  Please call the Pre-admissions Testing Dept. at [phone] if you have any questions about these instructions.  Surgery Visitation Policy:  Patients having surgery or a procedure may have two visitors.  Children under the age of 92 must have an adult with them who is not the patient.  Inpatient Visitation:    Visiting hours are 7 a.m. to 8 p.m. Up to four visitors are allowed at one time in a patient room. The visitors may rotate out with other people during the day.  One visitor age 54 or older may stay with the patient overnight and must be in the room by 8 p.m.   Merchandiser, retail to address health-related social needs:  [email or web address]                                                                                                             Preparing for Surgery with CHLORHEXIDINE  GLUCONATE (CHG) Soap  Chlorhexidine  Gluconate (CHG) Soap  o An antiseptic cleaner that kills germs and bonds with the skin to continue killing germs even after washing  o Used for showering the night before surgery and morning of surgery  Before surgery, you can play an important role by reducing the number of germs on your skin.  CHG (Chlorhexidine  gluconate) soap is an antiseptic cleanser which kills germs and bonds with the skin to continue killing germs even after washing.  Please do not use if you have an allergy to CHG or antibacterial soaps. If your skin becomes reddened/irritated stop using the CHG.  1. Shower the NIGHT BEFORE SURGERY and the MORNING OF SURGERY with CHG soap.  2. If you choose to wash your  hair, wash your hair first as usual with your normal shampoo.  3. After shampooing, rinse your hair and body thoroughly to remove the shampoo.  4. Use CHG as you would any other liquid soap. You can apply CHG directly to the skin and wash gently with a scrungie or a clean washcloth.  5. Apply the CHG soap to your body only from the neck down. Do not use on open wounds or open sores. Avoid contact with your eyes, ears, mouth, and genitals (private parts). Wash face and genitals (private parts)  with your normal soap.  6. Wash thoroughly, paying special attention to the area where your surgery will be performed.  7. Thoroughly rinse your body with warm water .  8. Do not shower/wash with your normal soap after using and rinsing off the CHG soap.  9. Pat yourself dry with a clean towel.  10. Wear clean pajamas to bed the night before surgery.  12. Place clean sheets on your bed the night of your first shower and do not sleep with pets.  13. Shower again with the CHG soap on the day of surgery prior to arriving at the hospital.  14. Do not apply any deodorants/lotions/powders.  15. Please wear clean clothes to the hospital.

## 2023-09-08 NOTE — Telephone Encounter (Signed)
 Called to confirm/remind patient of their appointment at [facility] on 09/12/23.   Appointment:   [] Confirmed  [x] Left mess   [] No answer/No voice mail  [] VM Full/unable to leave message  [] Phone not in service  Patient reminded to bring all medications and/or complete list.  Confirmed patient has transportation. Gave directions, instructed to utilize valet parking.

## 2023-09-08 NOTE — Progress Notes (Addendum)
 Perioperative / Anesthesia Services  Pre-Admission Testing Clinical Review / Pre-Operative Anesthesia Consult  Date: 09/12/23  PATIENT DEMOGRAPHICS: Name: Erik Martinez DOB: 1955/08/10 MRN:   [id number]  Note: Available PAT nursing documentation and vital signs have been reviewed. Clinical nursing staff has updated patient's PMH/PSHx, current medication list, and drug allergies/intolerances to ensure complete and comprehensive history available to assist care teams in MDM as it pertains to the aforementioned surgical procedure and anticipated anesthetic course. Extensive review of available clinical information personally performed. Erik Martinez PMH and PSHx updated with any diagnoses/procedures that  may have been inadvertently omitted during his intake with the pre-admission testing department's nursing staff.  PLANNED SURGICAL PROCEDURE(S):   Case: [id number] Date/Time: 09/13/23 1115   Procedure: COLECTOMY, PARTIAL, ROBOT-ASSISTED, LAPAROSCOPIC (Right: Abdomen)   Anesthesia type: General   Pre-op diagnosis: C18.3 Colon cancer   Location: [facility] OR ROOM 06 / [facility]   Surgeons: Tye Millet, DO        CLINICAL DISCUSSION: Erik Martinez is a 68 y.o. male who is submitted for pre-surgical anesthesia review and clearance prior to him undergoing the above procedure. Patient has never been a smoker in the past. Pertinent PMH includes: HFpEF, PAD, aortic atherosclerosis, HTN, HLD, T2DM, lung nodules (favored to be benign), GERD (on daily PPI), HBV/HCV (s/p Tx), anemia, ED (on PDE5i), neurogenic bladder, adenocarcinoma of the colon, chronic pain syndrome (followed by pain clinic in [place]; on COT), degenerative disc disease, bipolar disorder, depression, anxiety, insomnia, polysubstance abuse (cocaine + THC + BZO + opioids).  Patient is followed by cardiology (Gollan, MD). He was last seen during an inpatient admission on 08/23/2023; notes reviewed. At the time patient was  seen, he was complaining of bilateral lower extremity edema and shortness of breath.  He had recently been started on furosemide  for his edema.  Patient was seen in the setting of volume overload and anemia.  Patient was diuresed and underwent evaluation with gastroenterology for bleeding. Colonic mass was noted; pathology (+) for malignancy. Patient was stabilized and ultimately discharged home in stable condition with plans for outpatient cardiology, gastroenterology, and general surgery follow-ups.  Since patient was discharged from the hospital, he has denied any significant chest pain, shortness of breath, PND, orthopnea, palpitations, fatigue, vertiginous symptoms, or presyncope/syncope. Patient with a past medical history significant for cardiovascular diagnoses. Documented physical exam was grossly benign, providing no evidence of acute exacerbation and/or decompensation of the patient's known cardiovascular conditions.  Most recent TTE performed on 08/24/2023 revealed a low normal left ventricular systolic function with an EF of 50-55%. There were no regional wall motion abnormalities. Left ventricular diastolic Doppler parameters consistent with abnormal relaxation (G1DD).there was mild left atrial dilatation.  Right ventricular size and function normal with a TAPSE measuring 1.8 cm  (normal range >/= 1.6 cm).  RVSP = 12.4 mmHg. mild mitral annular calcification and aortic valve sclerosis/calcification present.  There was mild to moderate mitral valve regurgitation.  All transvalvular gradients were noted to be normal providing no evidence of hemodynamically significant valvular stenosis. Aorta normal in size with no evidence of ectasia or aneurysmal dilatation.  Blood pressure well controlled at 118/62 mmHg on currently prescribed diuretic (furosemide  + spironolactone  + metolazone ) therapies.  Patient is on atorvastatin  for his HLD diagnosis and ASCVD prevention. In the setting of known cardiovascular  diagnoses, it is important note that patient is on a PDE5i medication (tadalafil) for an erectile dysfunction diagnosis.  T2DM loosely controlled on currently prescribed regimen;  last HgbA1c was 7.4% when checked on 07/18/2023.  Patient does not have an OSAH diagnosis.  Functional capacity somewhat limited by patient's generalized weakness and multiple medical comorbidities in the setting of both anemia and new malignancy diagnosis.  He experiences varying levels of shortness of breath associated with completing his ADLs/IADLs.  With that said, patient felt to be able to achieve at least 4 METS of physical activity without experiencing any significant degrees of angina/anginal equivalent symptoms.  No changes were made to his medication regimen.  Patient to follow-up with outpatient cardiology for ongoing management of his cardiovascular diagnoses.  Erik Martinez underwent CT imaging of the chest, abdomen, and pelvis on 08/25/2023 that revealed circumferential luminal narrowing of the colon at the hepatic flexure consistent with a primary colonic neoplasm.  Subsequent colonoscopy was performed and biopsies were taken.  Surgical pathology demonstrated moderately differentiated invasive adenocarcinoma.  Patient was referred to general surgery for consultation regarding need for bowel resection.  Patient subsequently scheduled for a ROBOT ASSISTED LAPAROSCOPIC PARTIAL COLECTOMY (Right: Abdomen) on 09/13/2023 with Dr. Henriette Pierre, DO. Given patient's past medical history significant for cardiovascular diagnoses, presurgical cardiac clearance was sought by the PAT team.  Per cardiology, echocardiogram detailing low normal ejection fraction.  No further cardiac workup needed at this time.  Patient may proceed at an overall ACCEPTABLE risk from a cardiovascular perspective.  In review of his medication reconciliation, the patient is not noted to be taking any type of anticoagulation or antiplatelet therapies that  would need to be held during his perioperative course.  Patient reports previous perioperative complications with anesthesia in the past. Patient has a PMH (+) for PONV. Symptoms and history of PONV will be discussed with patient by anesthesia team on the day of her procedure. Interventions will be ordered as deemed necessary based on patient's individual care needs as determined by anesthesiologist.  Additionally, patient with history of (+) PDPH following neuraxial anesthetic course.  Patient reporting that he is has experience (+) difficulties with being sedated/anesthetized in the past. In review his EMR, it is noted that patient underwent a general anesthetic course here at [facility] in 08/2023 without documented complications.   MOST RECENT VITAL SIGNS:    09/08/2023   12:22 PM 09/04/2023    2:27 PM 09/01/2023   12:14 PM  Vitals with BMI  Height   5' 8  Weight  189 lbs 4 oz 182 lbs  BMI  28.78 27.68  Systolic 129 139 877  Diastolic 66 80 70  Pulse 97 100 88   PROVIDERS/SPECIALISTS: NOTE: Primary physician provider listed below. Patient may have been seen by APP or partner within same practice.   PROVIDER ROLE / SPECIALTY LAST Erik Pierre Henriette, DO General Surgery (Surgeon) 09/06/2023  [name], MD Primary Care Provider 09/01/2023  Perla Lye, MD Cardiology Seen as an inpatient on 08/23/2023  Donette City, FNP-C Advanced Heart Failure 09/04/2023   ALLERGIES: Allergies  Allergen Reactions   Suboxone [Buprenorphine Hcl-Naloxone  Hcl] Nausea And Vomiting   Tape Other (See Comments)    Whelps *Paper Tape is ok    Codeine Rash, Itching and Nausea Only   Morphine  And Codeine Itching and Rash   Other Rash    Telemetry electrodes   Silicone Rash    Whelps -*Paper Tape is ok     CURRENT HOME MEDICATIONS: No current facility-administered medications for this encounter.    atorvastatin  (LIPITOR) 20 MG tablet  celecoxib   (CELEBREX ) 200 MG capsule   cyclobenzaprine  (FLEXERIL ) 10 MG tablet   empagliflozin  (JARDIANCE ) 25 MG TABS tablet   furosemide  (LASIX ) 20 MG tablet   insulin  aspart (NOVOLOG ) 100 UNIT/ML injection   iron  polysaccharides (NIFEREX) 150 MG capsule   lidocaine  (LIDODERM ) 5 %   lubiprostone  (AMITIZA ) 8 MCG capsule   metolazone  (ZAROXOLYN ) 2.5 MG tablet   Oxycodone  HCl 20 MG TABS   pantoprazole  (PROTONIX ) 40 MG tablet   polyethylene glycol (MIRALAX  / GLYCOLAX ) 17 g packet   pregabalin  (LYRICA ) 200 MG capsule   Semaglutide , 2 MG/DOSE, (OZEMPIC , 2 MG/DOSE,) 8 MG/3ML SOPN   spironolactone  (ALDACTONE ) 25 MG tablet   tadalafil (CIALIS) 20 MG tablet   terbinafine  (LAMISIL ) 250 MG tablet   zolpidem  (AMBIEN ) 10 MG tablet   Continuous Glucose Sensor (FREESTYLE LIBRE 3 PLUS SENSOR) MISC   HISTORY: Past Medical History:  Diagnosis Date   (HFpEF) heart failure with preserved ejection fraction (HCC)    Adenocarcinoma, colon (HCC) 08/25/2023   Anemia    Anxiety    Aortic atherosclerosis (HCC)    Arthritis    Avascular necrosis of bones of both hips (HCC)    Bipolar disorder (HCC)    Bladder spasms    Chronic pain of left knee    Chronic pain syndrome    a.) followed by pain clinic in [place]   Chronic, continuous use of opioids    a.) managed by pain clinic in [place]   DDD (degenerative disc disease)    Depression    Erectile dysfunction    a.) on PDE5i (tadalafil)   Frequency-urgency syndrome    GERD (gastroesophageal reflux disease)    Hepatitis C 2018   History of anesthesia complications    a.) PONV; b.) PDPH; c.) difficult to sedate   History of hepatitis B    1983  TX'D--  NO ISSUES OR SYMPTOMS SINCE   Hyperlipidemia    Hypotestosteronemia    Insomnia    a.) on hypnotic PRN (zolpidem )   Multiple lung nodules on CT    a.) CT CAP 08/25/2023: along oblique fissures - favored to be benign   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   Neurogenic bladder    Nocturia    PAD (peripheral artery  disease) (HCC)    Polysubstance abuse (HCC)    a.) cocaine + marijuana + BZO + opioids   PONV (postoperative nausea and vomiting)    Spinal headache    T2DM (type 2 diabetes mellitus) (HCC)    Urine incontinence    Past Surgical History:  Procedure Laterality Date   APPLICATION OF WOUND VAC Left 09/11/2021   Procedure: APPLICATION OF WOUND VAC;  Surgeon: [name], MD;  Location: [facility] ORS;  Service: Orthopedics;  Laterality: Left;  [id number]    BACK SURGERY     2 rods and 4 screws artificial disc   COLONOSCOPY  2008   COLONOSCOPY N/A 08/25/2023   Procedure: COLONOSCOPY;  Surgeon: [name], MD;  Location: [facility] ENDOSCOPY;  Service: Endoscopy;  Laterality: N/A;   ESOPHAGOGASTRODUODENOSCOPY N/A 08/25/2023   Procedure: EGD (ESOPHAGOGASTRODUODENOSCOPY);  Surgeon: [name], MD;  Location: [facility] ENDOSCOPY;  Service: Endoscopy;  Laterality: N/A;   INTERSTIM IMPLANT PLACEMENT  2007   INTERSTIM IMPLANT REVISION N/A 08/17/2012   Procedure: REPLACMENT OF IPG PLUS REPLACE LEAD OF INTERSTIM IMPLANT ;  Surgeon: [name], MD;  Location: [facility] ;  Service: Urology;  Laterality: N/A;   IRRIGATION AND DEBRIDEMENT KNEE Left 09/11/2021  Procedure: IRRIGATION AND DEBRIDEMENT WITH POLY EXCHANGE LEFT KNEE;  Surgeon: [name], MD;  Location: [facility] ORS;  Service: Orthopedics;  Laterality: Left;   IRRIGATION AND DEBRIDEMENT KNEE Left 06/01/2022   Procedure: IRRIGATION AND DEBRIDEMENT KNEE;  Surgeon: [name], MD;  Location: [facility] ORS;  Service: Orthopedics;  Laterality: Left;   LOWER EXTREMITY ANGIOGRAPHY Left 11/17/2020   Procedure: LOWER EXTREMITY ANGIOGRAPHY;  Surgeon: [name], MD;  Location: [facility] INVASIVE CV LAB;  Service: Cardiovascular;  Laterality: Left;   LOWER EXTREMITY ANGIOGRAPHY Right 12/22/2020   Procedure: LOWER EXTREMITY ANGIOGRAPHY;  Surgeon: [name], MD;  Location: [facility] INV[facility] CV LAB;  Service: Cardiovascular;  Laterality:  Right;   LOWER EXTREMITY ANGIOGRAPHY Left 12/14/2021   Procedure: Lower Extremity Angiography;  Surgeon: [name], MD;  Location: [facility] INVASIVE CV LAB;  Service: Cardiovascular;  Laterality: Left;   LUMBAR DISC SURGERY  2005   L5   LUMBAR FUSION  X2  2006  &  2007   MANIPULATION KNEE JOINT Left 08/2002   OPEN DEBRIDEMENT LEFT TOTAL KNEE (SCAR, BONEY GRAOWTH)/ REMOVAL OLD SUTURES  05/01/2007   PARTIAL HIP ARTHROPLASTY Left 2001   x2   SHOULDER OPEN ROTATOR CUFF REPAIR Left 2000   TOTAL HIP ARTHROPLASTY Left 2002   TOTAL KNEE ARTHROPLASTY Left 06/2002   PARTIAL LEFT KNEE REPLACEMENT PRIOR TO THIS   TOTAL KNEE REVISION Left 2008   TOTAL KNEE REVISION Left 07/19/2021   Procedure: TOTAL KNEE REVISION;  Surgeon: [name], MD;  Location: [facility] ORS;  Service: Orthopedics;  Laterality: Left;   TOTAL KNEE REVISION Left 2005   TOTAL KNEE REVISION Left 06/01/2022   Procedure: TOTAL KNEE REVISION;  Surgeon: [name], MD;  Location: [facility] ORS;  Service: Orthopedics;  Laterality: Left;   TOTAL KNEE REVISION Left 04/01/2022   Procedure: Removal of left total knee implants with insertion of spacer.;  Surgeon: [name], MD;  Location: [facility] ORS;  Service: Orthopedics;  Laterality: Left;   TRIGGER FINGER RELEASE Bilateral    SEVERAL FINGERS   TRIGGER FINGER RELEASE Right 06/18/2014   Procedure: RELEASE TRIGGER FINGER/A-1 PULLEY;  Surgeon: Lynwood SQUIBB Mardee, MD;  Location: [facility] ORS;  Service: Orthopedics;  Laterality: Right;   Family History  Problem Relation Age of Onset   Diabetes Mother    Arthritis Mother    Cancer Mother        lung cancer   Hyperlipidemia Mother    Stroke Mother    Alcohol abuse Father    Cancer Father        lung   Arthritis Father    Diabetes Brother    Social History   Tobacco Use   Smoking status: Never   Smokeless tobacco: Never  Substance Use Topics   Alcohol use: Not Currently    Alcohol/week: 0.0 - 2.0 standard drinks of alcohol    LABS:  Lab Results  Component Value Date   WBC 5.5 09/08/2023   HGB 8.8 (L) 09/08/2023   HCT 28.5 (L) 09/08/2023   MCV 65.5 (L) 09/08/2023   PLT 397 09/08/2023   Lab Results  Component Value Date   NA 134 (L) 09/04/2023   CL 102 09/04/2023   K 3.6 09/04/2023   CO2 24 09/04/2023   BUN 22 09/04/2023   CREATININE 1.21 09/04/2023   GFRNONAA >60 09/04/2023   CALCIUM  8.3 (L) 09/04/2023   PHOS 3.2 05/24/2013   ALBUMIN 2.7 (L) 08/22/2023   GLUCOSE 145 (H)  09/04/2023    ECG: Date: 08/18/2023 Time ECG obtained: 1646 PM Rate: 78 bpm Rhythm: Sinus rhythm Axis (leads I and aVF): normal Intervals: PR 148 ms. QRS 110 ms. QTc 427 ms. ST segment and T wave changes: Nonspecific ST depression; diffuse T wave abnormalities  Evidence of a possible, age undetermined, prior infarct:  No Comparison: Previous tracing obtained on 03/28/2022 showed normal sinus rhythm at a rate of 88 bpm.   IMAGING / PROCEDURES: CT CHEST ABDOMEN PELVIS W CONTRAST performed on 08/25/2023 No evidence of thoracic metastasis. Small nodules along the oblique fissures are favored benign. Circumferential luminal narrowing of the colon at the hepatic flexure consistent with primary colonic neoplasm. No evidence of metastatic adenopathy in the abdomen pelvis. No evidence of liver metastasis. Aortic atherosclerosis    TRANSTHORACIC ECHOCARDIOGRAM performed on 08/24/2023 Left ventricular ejection fraction, by estimation, is 50 to 55%. The left ventricle has low normal function. The left ventricle has no regional wall motion abnormalities. Left ventricular diastolic parameters are consistent with Grade I diastolic dysfunction (impaired relaxation).  Right ventricular systolic function is normal. The right ventricular size is normal. There is normal pulmonary artery systolic pressure. The estimated right ventricular systolic pressure is 12.4 mmHg.  Left atrial size was mildly dilated.  The mitral valve is normal in  structure. Mild to moderate mitral valve regurgitation. No evidence of mitral stenosis.  The aortic valve is normal in structure. There is mild calcification of the aortic valve. Aortic valve regurgitation is not visualized. Aortic valve sclerosis/calcification is present, without any evidence of aortic stenosis.  The inferior vena cava is normal in size with greater than 50% respiratory variability, suggesting right atrial pressure of 3 mmHg.   DG CHEST 2 VIEW performed on 08/22/2023 The heart size and mediastinal contours are within normal limits. Aortic atherosclerosis.  Small bilateral pleural effusions, more pronounced on the right.  Bilateral interstitial prominence, likely reflects interstitial pulmonary edema.  No pneumothorax.  No acute osseous abnormality.  IMPRESSION AND PLAN: WHITMAN MEINHARDT has been referred for pre-anesthesia review and clearance prior to him undergoing the planned anesthetic and procedural courses. Available labs, pertinent testing, and imaging results were personally reviewed by me in preparation for upcoming operative/procedural course. [facility] medical record has been updated following extensive record review and patient interview with PAT staff.   This patient has been appropriately cleared by cardiology with an overall ACCEPTABLE risk of patient experiencing significant perioperative cardiovascular complications. Based on clinical review performed today (09/12/23), barring any significant acute changes in the patient's overall condition, it is anticipated that he will be able to proceed with the planned surgical intervention. Any acute changes in clinical condition may necessitate his procedure being postponed and/or cancelled. Patient will meet with anesthesia team (MD and/or CRNA) on the day of his procedure for preoperative evaluation/assessment. Questions regarding anesthetic course will be fielded at that time.   Pre-surgical instructions were reviewed  with the patient during his PAT appointment, and questions were fielded to satisfaction by PAT clinical staff. He has been instructed on which medications that he will need to hold prior to surgery, as well as the ones that have been deemed safe/appropriate to take on the day of his procedure. As part of the general education provided by PAT, patient made aware both verbally and in writing, that he would need to abstain from the use of any illegal substances during his perioperative course. He was advised that failure to follow the provided instructions could necessitate case  cancellation or result in serious perioperative complications up to and including death. Patient encouraged to contact PAT and/or his surgeon's office to discuss any questions or concerns that may arise prior to surgery; verbalized understanding.   Dorise Pereyra, MSN, APRN, FNP-C, CEN [facility]  Perioperative Services Nurse Practitioner Phone: [phone] Fax: [phone] 09/12/23 10:15 AM  NOTE: This note has been prepared using Dragon dictation software. Despite my best ability to proofread, there is always the potential that unintentional transcriptional errors may still occur from this process.

## 2023-09-09 ENCOUNTER — Telehealth: Payer: Self-pay | Admitting: Cardiology

## 2023-09-09 ENCOUNTER — Other Ambulatory Visit: Payer: Self-pay | Admitting: Family Medicine

## 2023-09-09 NOTE — Telephone Encounter (Signed)
 Patient called in requesting his results from lab draw yesterday.  Informed patient of CBC noting hemoglobin of 8.8.  Patient thanked me for call back.

## 2023-09-10 NOTE — Progress Notes (Deleted)
 Advanced Heart Failure Clinic Note   Referring Physician: 08/25 admission PCP: Erik Greig BRAVO, MD Cardiologist: None   Chief Complaint:    HPI:  Erik Martinez is a 68 y/o male with a history of HFpEF (08/25), T2DM, chronic pain, hyperlipidemia, anemia, colon mass (08/25), bipolar d/o, GERD, Hep B/C s/p treatment, previous cocaine use and bladder stimulator due to nerve injury from back surgery.    Admitted 08/22/23 with shortness of breath and leg edema due to HF exacerbation due to severe anemia. Hg 7.3. Chest x-ray consistent with pulmonary vascular congestion. BNP elevated. Started on IV lasix  and cardiology consulted. Started on protonix  and GI consulted for anemia. Echo 08/24/23: EF 50-55%, G1DD, normal RV, mild/ moderate Erik. Normal EGD 08/25/23. Colonoscopy 08/25/23 with a partially obstructing large mass in the transverse colon, likely malignant, biopsied. Surgery consulted. Chest/ abdomen/ pelvic CT 08/25/23 showing 4mm nodule, 5mm nodule right lung, colon mass.   Seen in [facility] 08/28/23 where furosemide  20mg  daily and spironolactone  25mg  daily were started.   He presents today for a HF follow-up visit with a chief complaint of   Has surgical appt 09/06/23 to discuss colon mass biopsy.   Lost his pit bull dog ~ 1 month ago and was devastated. Just received a 6 week [place] Shepherd puppy 08/27/23. Does paint/ body work.   Denies tobacco, alcohol, drug use.   ROS: All systems negative except what is listed in HPI, PMH and Problem List   Past Medical History:  Diagnosis Date   (HFpEF) heart failure with preserved ejection fraction (HCC)    Adenocarcinoma, colon (HCC) 08/25/2023   Anemia    Anxiety    Aortic atherosclerosis (HCC)    Arthritis    Avascular necrosis of bones of both hips (HCC)    Bipolar disorder (HCC)    Bladder spasms    Chronic pain of left knee    Chronic pain syndrome    a.) followed by pain clinic in [place]   Chronic, continuous use of opioids    a.) managed by  pain clinic in [place]   DDD (degenerative disc disease)    Depression    Erectile dysfunction    a.) on PDE5i (tadalafil)   Frequency-urgency syndrome    GERD (gastroesophageal reflux disease)    Hepatitis C 2018   History of anesthesia complications    a.) PONV; b.) PDPH; c.) difficult to sedate   History of hepatitis B    1983  TX'D--  NO ISSUES OR SYMPTOMS SINCE   Hyperlipidemia    Hypotestosteronemia    Insomnia    a.) on hypnotic PRN (zolpidem )   Multiple lung nodules on CT    a.) CT CAP 08/25/2023: along oblique fissures - favored to be benign   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     celecoxib  (CELEBREX ) 200 MG capsule Take 1 capsule (200 mg total) by mouth 2 (two) times daily.     Continuous Glucose Sensor (FREESTYLE LIBRE 3 PLUS SENSOR) MISC CHANCE SENSOR EVERY 15  DAYS 2 each 3   cyclobenzaprine  (FLEXERIL ) 10 MG tablet Take 10 mg by mouth 3 (three) times daily.     empagliflozin  (JARDIANCE ) 25 MG TABS tablet Take 1 tablet (25 mg total) by mouth daily. 90 tablet 1   furosemide  (LASIX ) 20 MG tablet Take 3 tablets (60 mg total) by mouth daily. 90 tablet 5   insulin  aspart (NOVOLOG ) 100 UNIT/ML injection Per sliding scale  MAX 15 units in 24 hour period. (Patient taking differently: Inject 0-15 Units into the skin 3 (three) times daily as needed for high blood sugar. Per sliding scale  MAX 15 units in 24 hour period.) 10 mL PRN   iron  polysaccharides (NIFEREX) 150 MG capsule Take 1 capsule (150 mg total) by mouth daily. 30 capsule 0   lidocaine  (LIDODERM ) 5 % Place 3 patches  onto the skin daily. Remove & Discard patch within 12 hours or as directed by MD (Patient taking differently: Place 3 patches onto the skin daily as needed (Pain). Remove & Discard patch within 12 hours or as directed by MD) 90 patch 0   lubiprostone  (AMITIZA ) 8 MCG capsule TAKE 1 CAPSULE BY MOUTH TWICE DAILY WITHMEALS 60 capsule 0   metolazone  (ZAROXOLYN ) 2.5 MG tablet Take Metolazone  2.5 MG on Tuesday, Wednesday, Thursday 1/2 hour before you take your Lasix . 3 tablet 1   Oxycodone  HCl 20 MG TABS Take 1 tablet by mouth every 4 (four) hours as needed (Pain).     pantoprazole  (PROTONIX ) 40 MG tablet Take 1 tablet (40 mg total) by mouth daily. 90 tablet 3   polyethylene glycol (MIRALAX  / GLYCOLAX ) 17 g packet Take 17 g by mouth daily as needed for mild constipation. 14 each 0   pregabalin  (LYRICA ) 200 MG capsule Take 1 capsule (200 mg total) by mouth in the morning, at noon, and at bedtime. 90 capsule 0   Semaglutide , 2 MG/DOSE, (OZEMPIC , 2 MG/DOSE,) 8 MG/3ML SOPN Inject 2 mg into the skin once a week. Monday     spironolactone  (ALDACTONE ) 25 MG tablet Take 1 tablet (25 mg total) by mouth daily. 90 tablet 3   tadalafil (CIALIS) 20 MG tablet Take 20 mg by mouth daily as needed for erectile dysfunction.     terbinafine  (LAMISIL ) 250 MG tablet Take 1 tablet (250 mg total) by mouth daily. 90 tablet 0   zolpidem  (AMBIEN ) 10 MG tablet Take 1 tablet (10 mg total) by mouth at bedtime as needed. for sleep (Patient taking differently: Take 10 mg by mouth at bedtime. for sleep) 30 tablet 1   No current facility-administered medications for this visit.    Allergies  Allergen Reactions   Suboxone [Buprenorphine Hcl-Naloxone  Hcl] Nausea And Vomiting   Tape Other (See Comments)    Whelps *Paper Tape is ok    Codeine Rash, Itching and Nausea Only   Morphine  And Codeine Itching and Rash   Other Rash    Telemetry electrodes   Silicone Rash    Whelps -*Paper Tape is ok       Social History    Socioeconomic History   Marital status: Divorced    Spouse name: Not on file   Number of children: 3   Years of education: Not on file   Highest education level: Some college, no degree  Occupational History   Not on file  Tobacco Use   Smoking status: Never   Smokeless tobacco: Never  Vaping Use   Vaping status: Never Used  Substance and Sexual Activity  Alcohol use: Not Currently    Alcohol/week: 0.0 - 2.0 standard drinks of alcohol   Drug use: Not Currently    Types: Cocaine    Comment: last + cocaine UDS 07/2016   Sexual activity: Not Currently  Other Topics Concern   Not on file  Social History Narrative    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   Overall Financial Resource Strain (CARDIA)    Difficulty of Paying Living Expenses: Not hard at all  Food Insecurity: No Food Insecurity (08/31/2023)   Received from [facility]   Hunger Vital Sign    Within the past 12 months, you worried that your food would run out before you got the money to buy more.: Never true    Within the past 12 months, the food you bought just didn't last and you didn't have money to get more.: Never true  Recent Concern: Food Insecurity - Food Insecurity Present (07/11/2023)   Received from [facility]   Hunger Vital Sign    Within the past 12 months, you worried that your food would run out before you got the money to buy more.: Sometimes true    Within the past 12 months, the food you bought just didn't last and you didn't have money to get more.: Never true  Transportation Needs: No Transportation Needs (08/31/2023)   Received from [facility] - Transportation    In the past 12 months, has lack of transportation kept you from medical appointments or from getting medications?: No    Lack of Transportation  (Non-Medical): No  Physical Activity: Inactive (01/28/2022)   Exercise Vital Sign    Days of Exercise per Week: 0 days    Minutes of Exercise per Session: 0 min  Stress: No Stress Concern Present (01/28/2022)   [employer] of Occupational Health - Occupational Stress Questionnaire    Feeling of Stress : Not at all  Social Connections: Moderately Isolated (01/28/2022)   Social Connection and Isolation Panel    Frequency of Communication with Friends and Family: More than three times a week    Frequency of Social Gatherings with Friends and Family: More than three times a week    Attends Religious Services: More than 4 times per year    Active Member of [employer] or Organizations: No    Attends Banker Meetings: Never    Marital Status: Divorced  Catering manager Violence: Not At Risk (08/30/2023)   Humiliation, Afraid, Rape, and Kick questionnaire    Fear of Current or Ex-Partner: No    Emotionally Abused: No    Physically Abused: No    Sexually Abused: No      Family History  Problem Relation Age of Onset   Diabetes Mother    Arthritis Mother    Cancer Mother        lung cancer   Hyperlipidemia Mother    Stroke Mother    Alcohol abuse Father    Cancer Father        lung   Arthritis Father    Diabetes Brother       PHYSICAL EXAM:  General: Well appearing.  Cor: No JVD. Regular rhythm, rate.  Lungs: clear Abdomen: soft, nontender, nondistended. Extremities: 2+ pitting edema bilaterally up to knees Neuro:. Affect pleasant   ECG: not done  ASSESSMENT & PLAN:  1: NICM with preserved ejection fraction- - suspect due to anemia or previous cocaine use - NYHA class I - euvolemic - weight 185.2 from last visit 1 week ago - Echo 08/24/23: EF 50-55%, G1DD, normal RV, mild/ moderate Erik - continue furosemide  20mg  daily - continue jardiance  25mg  daily (DM dose) - continue spironolactone  25mg  daily - wear compression socks daily with removal at bedtime -  reviewed sodium / fluid intake and parameters to keep under - BMET today - BMET 08/25/23 reviewed: sodium 137, potassium 4.3, creatinine 0.9 & GFR >60 - BNP 08/22/23 was 1782.6  2: T2DM- - A1c 07/18/23 was 7.4% - saw PCP Myrna) 08/25 - continue insulin / jardiance   3: Hyperlipidemia- - LDL 07/18/23 was 54  4: Anemia due to newly diagnosed colon mass- - Colonoscopy 08/25/23 with a partially obstructing large mass in the transverse colon, likely malignant, biopsied. Surgery consulted.  - Chest/ abdomen/ pelvic CT 08/25/23 showing 4mm nodule left lung fissure, 5mm nodule right lung fissure favored benign, colon mass. (Per report) - patient says that he sees surgeon next week to discuss further treatment and get biopsy results - Hg 08/25/23 was 8.4     Ellouise DELENA Class, [place] 09/10/23

## 2023-09-12 ENCOUNTER — Telehealth: Payer: Self-pay | Admitting: Family

## 2023-09-12 ENCOUNTER — Encounter: Admitting: Family

## 2023-09-12 NOTE — Telephone Encounter (Signed)
 PT SAID THEY ARE NOW TAKING 3 TIMES DAILY PLEASE SEND NEW RX THANK YOU  Please advise.  Med list still states bid.

## 2023-09-12 NOTE — Telephone Encounter (Signed)
 Patient did not show for his Heart Failure Clinic appointment on 09/12/23.

## 2023-09-13 ENCOUNTER — Encounter
Admission: RE | Disposition: A | Discharge status: Home / Self Care | Payer: Self-pay | Source: Home / Self Care | Attending: Surgery

## 2023-09-13 ENCOUNTER — Encounter: Payer: Self-pay | Admitting: Surgery

## 2023-09-13 ENCOUNTER — Inpatient Hospital Stay
Admission: RE | Admit: 2023-09-13 | Discharge: 2023-09-22 | DRG: 329 | Disposition: A | Discharge status: Home / Self Care | Attending: Surgery | Admitting: Surgery

## 2023-09-13 ENCOUNTER — Inpatient Hospital Stay: Payer: Self-pay | Admitting: Urgent Care

## 2023-09-13 ENCOUNTER — Other Ambulatory Visit: Payer: Self-pay

## 2023-09-13 DIAGNOSIS — Z801 Family history of malignant neoplasm of trachea, bronchus and lung: Secondary | ICD-10-CM

## 2023-09-13 DIAGNOSIS — K219 Gastro-esophageal reflux disease without esophagitis: Secondary | ICD-10-CM | POA: Diagnosis present

## 2023-09-13 DIAGNOSIS — D649 Anemia, unspecified: Secondary | ICD-10-CM

## 2023-09-13 DIAGNOSIS — I2510 Atherosclerotic heart disease of native coronary artery without angina pectoris: Secondary | ICD-10-CM | POA: Diagnosis present

## 2023-09-13 DIAGNOSIS — Z794 Long term (current) use of insulin: Secondary | ICD-10-CM | POA: Diagnosis not present

## 2023-09-13 DIAGNOSIS — I878 Other specified disorders of veins: Secondary | ICD-10-CM | POA: Diagnosis not present

## 2023-09-13 DIAGNOSIS — Z01812 Encounter for preprocedural laboratory examination: Secondary | ICD-10-CM

## 2023-09-13 DIAGNOSIS — N179 Acute kidney failure, unspecified: Secondary | ICD-10-CM | POA: Diagnosis not present

## 2023-09-13 DIAGNOSIS — E43 Unspecified severe protein-calorie malnutrition: Secondary | ICD-10-CM | POA: Diagnosis present

## 2023-09-13 DIAGNOSIS — E1142 Type 2 diabetes mellitus with diabetic polyneuropathy: Secondary | ICD-10-CM

## 2023-09-13 DIAGNOSIS — Z83438 Family history of other disorder of lipoprotein metabolism and other lipidemia: Secondary | ICD-10-CM

## 2023-09-13 DIAGNOSIS — E785 Hyperlipidemia, unspecified: Secondary | ICD-10-CM | POA: Diagnosis present

## 2023-09-13 DIAGNOSIS — Z811 Family history of alcohol abuse and dependence: Secondary | ICD-10-CM

## 2023-09-13 DIAGNOSIS — Z79891 Long term (current) use of opiate analgesic: Secondary | ICD-10-CM

## 2023-09-13 DIAGNOSIS — D638 Anemia in other chronic diseases classified elsewhere: Secondary | ICD-10-CM | POA: Diagnosis present

## 2023-09-13 DIAGNOSIS — E876 Hypokalemia: Secondary | ICD-10-CM | POA: Diagnosis present

## 2023-09-13 DIAGNOSIS — G4700 Insomnia, unspecified: Secondary | ICD-10-CM | POA: Diagnosis present

## 2023-09-13 DIAGNOSIS — K567 Ileus, unspecified: Secondary | ICD-10-CM | POA: Diagnosis not present

## 2023-09-13 DIAGNOSIS — G894 Chronic pain syndrome: Secondary | ICD-10-CM | POA: Diagnosis present

## 2023-09-13 DIAGNOSIS — I5031 Acute diastolic (congestive) heart failure: Secondary | ICD-10-CM | POA: Diagnosis not present

## 2023-09-13 DIAGNOSIS — Z9049 Acquired absence of other specified parts of digestive tract: Secondary | ICD-10-CM | POA: Diagnosis not present

## 2023-09-13 DIAGNOSIS — Z7985 Long-term (current) use of injectable non-insulin antidiabetic drugs: Secondary | ICD-10-CM

## 2023-09-13 DIAGNOSIS — Z8261 Family history of arthritis: Secondary | ICD-10-CM

## 2023-09-13 DIAGNOSIS — Z96642 Presence of left artificial hip joint: Secondary | ICD-10-CM | POA: Diagnosis present

## 2023-09-13 DIAGNOSIS — Z885 Allergy status to narcotic agent status: Secondary | ICD-10-CM

## 2023-09-13 DIAGNOSIS — Z79899 Other long term (current) drug therapy: Secondary | ICD-10-CM

## 2023-09-13 DIAGNOSIS — Z8262 Family history of osteoporosis: Secondary | ICD-10-CM

## 2023-09-13 DIAGNOSIS — E871 Hypo-osmolality and hyponatremia: Secondary | ICD-10-CM | POA: Diagnosis present

## 2023-09-13 DIAGNOSIS — Z7984 Long term (current) use of oral hypoglycemic drugs: Secondary | ICD-10-CM | POA: Diagnosis not present

## 2023-09-13 DIAGNOSIS — Z96652 Presence of left artificial knee joint: Secondary | ICD-10-CM | POA: Diagnosis not present

## 2023-09-13 DIAGNOSIS — D509 Iron deficiency anemia, unspecified: Secondary | ICD-10-CM | POA: Diagnosis not present

## 2023-09-13 DIAGNOSIS — I5032 Chronic diastolic (congestive) heart failure: Secondary | ICD-10-CM | POA: Diagnosis not present

## 2023-09-13 DIAGNOSIS — C184 Malignant neoplasm of transverse colon: Secondary | ICD-10-CM | POA: Diagnosis not present

## 2023-09-13 DIAGNOSIS — Z823 Family history of stroke: Secondary | ICD-10-CM

## 2023-09-13 DIAGNOSIS — C182 Malignant neoplasm of ascending colon: Secondary | ICD-10-CM | POA: Diagnosis not present

## 2023-09-13 DIAGNOSIS — N319 Neuromuscular dysfunction of bladder, unspecified: Secondary | ICD-10-CM | POA: Diagnosis not present

## 2023-09-13 DIAGNOSIS — E1165 Type 2 diabetes mellitus with hyperglycemia: Secondary | ICD-10-CM | POA: Diagnosis not present

## 2023-09-13 DIAGNOSIS — C189 Malignant neoplasm of colon, unspecified: Secondary | ICD-10-CM | POA: Diagnosis present

## 2023-09-13 DIAGNOSIS — D490 Neoplasm of unspecified behavior of digestive system: Principal | ICD-10-CM

## 2023-09-13 DIAGNOSIS — K760 Fatty (change of) liver, not elsewhere classified: Secondary | ICD-10-CM | POA: Diagnosis not present

## 2023-09-13 DIAGNOSIS — Z833 Family history of diabetes mellitus: Secondary | ICD-10-CM | POA: Diagnosis not present

## 2023-09-13 DIAGNOSIS — C183 Malignant neoplasm of hepatic flexure: Secondary | ICD-10-CM | POA: Diagnosis not present

## 2023-09-13 DIAGNOSIS — N529 Male erectile dysfunction, unspecified: Secondary | ICD-10-CM | POA: Diagnosis present

## 2023-09-13 DIAGNOSIS — Z6826 Body mass index (BMI) 26.0-26.9, adult: Secondary | ICD-10-CM

## 2023-09-13 DIAGNOSIS — Z91048 Other nonmedicinal substance allergy status: Secondary | ICD-10-CM

## 2023-09-13 DIAGNOSIS — J449 Chronic obstructive pulmonary disease, unspecified: Secondary | ICD-10-CM | POA: Diagnosis present

## 2023-09-13 DIAGNOSIS — Z01818 Encounter for other preprocedural examination: Secondary | ICD-10-CM

## 2023-09-13 DIAGNOSIS — Z8673 Personal history of transient ischemic attack (TIA), and cerebral infarction without residual deficits: Secondary | ICD-10-CM

## 2023-09-13 HISTORY — DX: Other nonspecific abnormal finding of lung field: R91.8

## 2023-09-13 HISTORY — DX: Atherosclerosis of aorta: I70.0

## 2023-09-13 HISTORY — DX: Insomnia, unspecified: G47.00

## 2023-09-13 HISTORY — DX: Personal history of other specified conditions: Z87.898

## 2023-09-13 HISTORY — DX: Opioid use, unspecified, uncomplicated: F11.90

## 2023-09-13 HISTORY — DX: Unspecified diastolic (congestive) heart failure: I50.30

## 2023-09-13 LAB — POTASSIUM
Potassium: 2.6 mmol/L — CL (ref 3.5–5.1)
Potassium: 3 mmol/L — ABNORMAL LOW (ref 3.5–5.1)
Potassium: 3.4 mmol/L — ABNORMAL LOW (ref 3.5–5.1)

## 2023-09-13 LAB — POCT I-STAT, CHEM 8
BUN: 25 mg/dL — ABNORMAL HIGH (ref 8–23)
Calcium, Ion: 1.01 mmol/L — ABNORMAL LOW (ref 1.15–1.40)
Chloride: 86 mmol/L — ABNORMAL LOW (ref 98–111)
Creatinine, Ser: 1.4 mg/dL — ABNORMAL HIGH (ref 0.61–1.24)
Glucose, Bld: 211 mg/dL — ABNORMAL HIGH (ref 70–99)
HCT: 28 % — ABNORMAL LOW (ref 39.0–52.0)
Hemoglobin: 9.5 g/dL — ABNORMAL LOW (ref 13.0–17.0)
Potassium: 2.5 mmol/L — CL (ref 3.5–5.1)
Sodium: 129 mmol/L — ABNORMAL LOW (ref 135–145)
TCO2: 29 mmol/L (ref 22–32)

## 2023-09-13 LAB — GLUCOSE, CAPILLARY: Glucose-Capillary: 181 mg/dL — ABNORMAL HIGH (ref 70–99)

## 2023-09-13 LAB — MAGNESIUM: Magnesium: 2.3 mg/dL (ref 1.7–2.4)

## 2023-09-13 SURGERY — COLECTOMY, PARTIAL, ROBOT-ASSISTED, LAPAROSCOPIC
Anesthesia: General | Site: Abdomen | Laterality: Right

## 2023-09-13 MED ORDER — TERBINAFINE HCL 250 MG PO TABS
250.0000 mg | ORAL_TABLET | Freq: Every day | ORAL | Status: DC
  Administered 2023-09-15 – 2023-09-22 (×8): 250 mg via ORAL
  Filled 2023-09-13 (×10): qty 1

## 2023-09-13 MED ORDER — PANTOPRAZOLE SODIUM 40 MG PO TBEC
40.0000 mg | DELAYED_RELEASE_TABLET | Freq: Every day | ORAL | Status: DC
  Administered 2023-09-13 – 2023-09-22 (×9): 40 mg via ORAL
  Filled 2023-09-13 (×9): qty 1

## 2023-09-13 MED ORDER — CELECOXIB 200 MG PO CAPS
200.0000 mg | ORAL_CAPSULE | Freq: Two times a day (BID) | ORAL | Status: DC
Start: 2023-09-13 — End: 2023-09-13
  Filled 2023-09-13: qty 1

## 2023-09-13 MED ORDER — CHLORHEXIDINE GLUCONATE 0.12 % MT SOLN
OROMUCOSAL | Status: AC
  Filled 2023-09-13: qty 15

## 2023-09-13 MED ORDER — ACETAMINOPHEN 500 MG PO TABS
ORAL_TABLET | ORAL | Status: AC
Start: 2023-09-13 — End: 2023-09-13
  Filled 2023-09-13: qty 2

## 2023-09-13 MED ORDER — HYDROCODONE-ACETAMINOPHEN 5-325 MG PO TABS
1.0000 | ORAL_TABLET | ORAL | Status: DC | PRN
  Filled 2023-09-13: qty 2

## 2023-09-13 MED ORDER — FERROUS GLUCONATE 324 (38 FE) MG PO TABS
324.0000 mg | ORAL_TABLET | Freq: Two times a day (BID) | ORAL | Status: DC
  Administered 2023-09-14 – 2023-09-16 (×4): 324 mg via ORAL
  Filled 2023-09-13 (×6): qty 1

## 2023-09-13 MED ORDER — POTASSIUM CHLORIDE CRYS ER 20 MEQ PO TBCR
30.0000 meq | EXTENDED_RELEASE_TABLET | Freq: Once | ORAL | Status: AC
  Administered 2023-09-13: 30 meq via ORAL
  Filled 2023-09-13: qty 1

## 2023-09-13 MED ORDER — POTASSIUM CHLORIDE 10 MEQ/100ML IV SOLN
10.0000 meq | INTRAVENOUS | Status: DC
  Administered 2023-09-13 (×3): 10 meq via INTRAVENOUS
  Filled 2023-09-13 (×7): qty 100

## 2023-09-13 MED ORDER — ALUM & MAG HYDROXIDE-SIMETH 200-200-20 MG/5ML PO SUSP
30.0000 mL | ORAL | Status: DC | PRN
  Administered 2023-09-13: 30 mL via ORAL
  Filled 2023-09-13: qty 30

## 2023-09-13 MED ORDER — CYCLOBENZAPRINE HCL 10 MG PO TABS
10.0000 mg | ORAL_TABLET | Freq: Three times a day (TID) | ORAL | Status: DC
Start: 2023-09-13 — End: 2023-09-19
  Administered 2023-09-13 – 2023-09-19 (×18): 10 mg via ORAL
  Filled 2023-09-13 (×18): qty 1

## 2023-09-13 MED ORDER — SPIRONOLACTONE 25 MG PO TABS
25.0000 mg | ORAL_TABLET | Freq: Every day | ORAL | Status: DC
  Administered 2023-09-13 – 2023-09-22 (×9): 25 mg via ORAL
  Filled 2023-09-13 (×9): qty 1

## 2023-09-13 MED ORDER — ACETAMINOPHEN 650 MG RE SUPP: 650.0000 mg | Freq: Four times a day (QID) | RECTAL | Status: DC | PRN

## 2023-09-13 MED ORDER — ZOLPIDEM TARTRATE 5 MG PO TABS
10.0000 mg | ORAL_TABLET | Freq: Every day | ORAL | Status: DC
  Administered 2023-09-13 – 2023-09-21 (×9): 10 mg via ORAL
  Filled 2023-09-13 (×9): qty 2

## 2023-09-13 MED ORDER — ONDANSETRON HCL 4 MG PO TABS
4.0000 mg | ORAL_TABLET | Freq: Four times a day (QID) | ORAL | Status: DC | PRN
Start: 2023-09-13 — End: 2023-09-22

## 2023-09-13 MED ORDER — ORAL CARE MOUTH RINSE: 15.0000 mL | Freq: Once | OROMUCOSAL | Status: AC

## 2023-09-13 MED ORDER — SODIUM CHLORIDE 0.9 % IV SOLN: 2.0000 g | INTRAVENOUS | Status: DC

## 2023-09-13 MED ORDER — HYDROMORPHONE HCL 1 MG/ML IJ SOLN
1.0000 mg | INTRAMUSCULAR | Status: DC | PRN
  Administered 2023-09-14 (×2): 1 mg via INTRAVENOUS
  Filled 2023-09-13 (×2): qty 1

## 2023-09-13 MED ORDER — CHLORHEXIDINE GLUCONATE 0.12 % MT SOLN
15.0000 mL | Freq: Once | OROMUCOSAL | Status: AC
  Administered 2023-09-13: 15 mL via OROMUCOSAL

## 2023-09-13 MED ORDER — SODIUM CHLORIDE 0.9 % IV SOLN
INTRAVENOUS | Status: AC
  Filled 2023-09-13: qty 2

## 2023-09-13 MED ORDER — SODIUM CHLORIDE 0.9 % IV SOLN: INTRAVENOUS | Status: AC

## 2023-09-13 MED ORDER — ENOXAPARIN SODIUM 40 MG/0.4ML IJ SOSY: 40.0000 mg | PREFILLED_SYRINGE | INTRAMUSCULAR | Status: DC

## 2023-09-13 MED ORDER — DOCUSATE SODIUM 100 MG PO CAPS
100.0000 mg | ORAL_CAPSULE | Freq: Two times a day (BID) | ORAL | Status: DC | PRN
  Administered 2023-09-16 – 2023-09-17 (×2): 100 mg via ORAL
  Filled 2023-09-13 (×2): qty 1

## 2023-09-13 MED ORDER — OXYCODONE HCL 20 MG PO TABS
1.0000 | ORAL_TABLET | ORAL | Status: DC | PRN
  Filled 2023-09-13: qty 1

## 2023-09-13 MED ORDER — PREGABALIN 75 MG PO CAPS
200.0000 mg | ORAL_CAPSULE | Freq: Three times a day (TID) | ORAL | Status: DC
  Administered 2023-09-13 – 2023-09-19 (×18): 200 mg via ORAL
  Filled 2023-09-13 (×18): qty 1

## 2023-09-13 MED ORDER — LACTATED RINGERS IV SOLN: INTRAVENOUS | Status: DC

## 2023-09-13 MED ORDER — CHLORHEXIDINE GLUCONATE CLOTH 2 % EX PADS: 6.0000 | MEDICATED_PAD | Freq: Once | CUTANEOUS | Status: DC

## 2023-09-13 MED ORDER — MIDAZOLAM HCL 2 MG/2ML IJ SOLN
INTRAMUSCULAR | Status: AC
  Filled 2023-09-13: qty 2

## 2023-09-13 MED ORDER — ACETAMINOPHEN 325 MG PO TABS
650.0000 mg | ORAL_TABLET | Freq: Four times a day (QID) | ORAL | Status: DC | PRN
Start: 2023-09-13 — End: 2023-09-22

## 2023-09-13 MED ORDER — ONDANSETRON HCL 4 MG/2ML IJ SOLN
4.0000 mg | Freq: Four times a day (QID) | INTRAMUSCULAR | Status: DC | PRN
  Administered 2023-09-14: 4 mg via INTRAVENOUS
  Filled 2023-09-13: qty 2

## 2023-09-13 MED ORDER — ACETAMINOPHEN 500 MG PO TABS
1000.0000 mg | ORAL_TABLET | ORAL | Status: AC
  Administered 2023-09-13: 1000 mg via ORAL

## 2023-09-13 MED ORDER — INSULIN ASPART 100 UNIT/ML IJ SOLN
0.0000 [IU] | Freq: Three times a day (TID) | INTRAMUSCULAR | Status: DC
  Administered 2023-09-13 – 2023-09-14 (×2): 3 [IU] via SUBCUTANEOUS
  Administered 2023-09-15: 2 [IU] via SUBCUTANEOUS
  Administered 2023-09-15: 3 [IU] via SUBCUTANEOUS
  Administered 2023-09-15 – 2023-09-16 (×4): 2 [IU] via SUBCUTANEOUS
  Administered 2023-09-17: 3 [IU] via SUBCUTANEOUS
  Administered 2023-09-18: 2 [IU] via SUBCUTANEOUS
  Filled 2023-09-13 (×10): qty 1

## 2023-09-13 MED ORDER — OXYCODONE HCL 5 MG PO TABS
20.0000 mg | ORAL_TABLET | ORAL | Status: DC | PRN
  Administered 2023-09-13 – 2023-09-22 (×26): 20 mg via ORAL
  Filled 2023-09-13 (×27): qty 4

## 2023-09-13 MED ORDER — POTASSIUM CHLORIDE CRYS ER 20 MEQ PO TBCR: 30.0000 meq | EXTENDED_RELEASE_TABLET | Freq: Once | ORAL | Status: DC

## 2023-09-13 MED ORDER — LIDOCAINE 5 % EX PTCH: 3.0000 | MEDICATED_PATCH | Freq: Every day | CUTANEOUS | Status: DC | PRN

## 2023-09-13 MED ORDER — ATORVASTATIN CALCIUM 20 MG PO TABS
20.0000 mg | ORAL_TABLET | Freq: Every day | ORAL | Status: DC
  Administered 2023-09-13 – 2023-09-21 (×9): 20 mg via ORAL
  Filled 2023-09-13 (×9): qty 1

## 2023-09-13 MED ORDER — FENTANYL CITRATE (PF) 100 MCG/2ML IJ SOLN
INTRAMUSCULAR | Status: AC
  Filled 2023-09-13: qty 2

## 2023-09-13 MED ORDER — POTASSIUM CHLORIDE CRYS ER 20 MEQ PO TBCR: 60.0000 meq | EXTENDED_RELEASE_TABLET | Freq: Two times a day (BID) | ORAL | Status: DC

## 2023-09-13 MED ORDER — SODIUM CHLORIDE 0.9% FLUSH
3.0000 mL | Freq: Two times a day (BID) | INTRAVENOUS | Status: DC
  Administered 2023-09-13 – 2023-09-22 (×17): 3 mL via INTRAVENOUS

## 2023-09-13 NOTE — Plan of Care (Signed)

## 2023-09-13 NOTE — Anesthesia Preprocedure Evaluation (Addendum)
 Anesthesia Evaluation  Patient identified by MRN, date of birth, ID band Patient awake    Reviewed: Allergy & Precautions, NPO status , Patient's Chart, lab work & pertinent test results  History of Anesthesia Complications (+) PONV and history of anesthetic complications  Airway Mallampati: III  TM Distance: >3 FB Neck ROM: full    Dental  (+) Partial Lower, Upper Dentures, Poor Dentition, Chipped, Implants   Pulmonary neg shortness of breath   Pulmonary exam normal        Cardiovascular Exercise Tolerance: Good (-) angina + Peripheral Vascular Disease and +CHF  (-) Past MI Normal cardiovascular exam     Neuro/Psych  Headaches PSYCHIATRIC DISORDERS       Neuromuscular disease    GI/Hepatic Neg liver ROS,GERD  ,,  Endo/Other  diabetes, Type 2    Renal/GU      Musculoskeletal   Abdominal   Peds  Hematology negative hematology ROS (+)   Anesthesia Other Findings Patient has cardiac clearance for this procedure.   Past Medical History: No date: (HFpEF) heart failure with preserved ejection fraction (HCC) 08/25/2023: Adenocarcinoma, colon (HCC) No date: Anemia No date: Anxiety No date: Aortic atherosclerosis (HCC) No date: Arthritis No date: Avascular necrosis of bones of both hips (HCC) No date: Bipolar disorder (HCC) No date: Bladder spasms No date: Chronic pain of left knee No date: Chronic pain syndrome     Comment:  a.) followed by pain clinic in [place] No date: Chronic, continuous use of opioids     Comment:  a.) managed by pain clinic in [place] No date: DDD (degenerative disc disease) No date: Depression No date: Erectile dysfunction     Comment:  a.) on PDE5i (tadalafil) No date: Frequency-urgency syndrome No date: GERD (gastroesophageal reflux disease) 2018: Hepatitis C No date: History of anesthesia complications     Comment:  a.) PONV; b.) PDPH; c.) difficult to sedate No date: History of hepatitis  B     Comment:  1983  TX'D--  NO ISSUES OR SYMPTOMS SINCE No date: Hyperlipidemia No date: Hypotestosteronemia No date: Insomnia     Comment:  a.) on hypnotic PRN (zolpidem ) No date: Multiple lung nodules on CT     Comment:  a.) CT CAP 08/25/2023: along oblique fissures - favored               to be benign 09/08/2015: 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HCC) No date: Neurogenic bladder No date: Nocturia No date: PAD (peripheral artery disease) (HCC) No date: Polysubstance abuse (HCC)     Comment:  a.) cocaine + marijuana + BZO + opioids No date: PONV (postoperative nausea and vomiting) No date: Spinal headache No date: T2DM (type 2 diabetes mellitus) (HCC) No date: Urine incontinence  Past Surgical History: 09/11/2021: APPLICATION OF WOUND VAC; Left     Comment:  Procedure: APPLICATION OF WOUND VAC;  Surgeon: [name], MD;  Location: [facility];  Service: Orthopedics;               Laterality: Left;  [id number]  No date: BACK SURGERY     Comment:  2 rods and 4 screws artificial disc 2008: COLONOSCOPY 08/25/2023: COLONOSCOPY; N/A     Comment:  Procedure: COLONOSCOPY;  Surgeon: [name], MD;                Location: [facility];  Service: Endoscopy;  Laterality: N/A; 08/25/2023: ESOPHAGOGASTRODUODENOSCOPY; N/A     Comment:  Procedure: EGD (ESOPHAGOGASTRODUODENOSCOPY);  Surgeon:               [name], MD;  Location: [facility] END[facility];  Service:               Endoscopy;  Laterality: N/A; 2007: INTERSTIM IMPLANT PLACEMENT 08/17/2012: INTERSTIM IMPLANT REVISION; N/A     Comment:  Procedure: REPLACMENT OF IPG PLUS REPLACE LEAD OF               INTERSTIM IMPLANT ;  Surgeon: [name], MD;                Location: [facility];  Service: Urology;               Laterality: N/A; 09/11/2021: IRRIGATION AND DEBRIDEMENT KNEE; Left     Comment:  Procedure: IRRIGATION AND DEBRIDEMENT WITH POLY EXCHANGE              LEFT KNEE;  Surgeon:  [name], MD;  Location: [facility]              ORS;  Service: Orthopedics;  Laterality: Left; 06/01/2022: IRRIGATION AND DEBRIDEMENT KNEE; Left     Comment:  Procedure: IRRIGATION AND DEBRIDEMENT KNEE;  Surgeon:               [name], MD;  Location: [facility];  Service:               Orthopedics;  Laterality: Left; 11/17/2020: LOWER EXTREMITY ANGIOGRAPHY; Left     Comment:  Procedure: LOWER EXTREMITY ANGIOGRAPHY;  Surgeon:               [name], MD;  Location: [facility];               Service: Cardiovascular;  Laterality: Left; 12/22/2020: LOWER EXTREMITY ANGIOGRAPHY; Right     Comment:  Procedure: LOWER EXTREMITY ANGIOGRAPHY;  Surgeon:               [name], MD;  Location: [facility];               Service: Cardiovascular;  Laterality: Right; 12/14/2021: LOWER EXTREMITY ANGIOGRAPHY; Left     Comment:  Procedure: Lower Extremity Angiography;  Surgeon:               [name], MD;  Location: [facility];               Service: Cardiovascular;  Laterality: Left; 2005: LUMBAR DISC SURGERY     Comment:  L5 X2  2006  &  2007: LUMBAR FUSION 08/2002: MANIPULATION KNEE JOINT; Left 05/01/2007: OPEN DEBRIDEMENT LEFT TOTAL KNEE (SCAR, BONEY GRAOWTH)/  REMOVAL OLD SUTURES 2001: PARTIAL HIP ARTHROPLASTY; Left     Comment:  x2 2000: SHOULDER OPEN ROTATOR CUFF REPAIR; Left 2002: TOTAL HIP ARTHROPLASTY; Left 06/2002: TOTAL KNEE ARTHROPLASTY; Left     Comment:  PARTIAL LEFT KNEE REPLACEMENT PRIOR TO THIS 2008: TOTAL KNEE REVISION; Left 07/19/2021: TOTAL KNEE REVISION; Left     Comment:  Procedure: TOTAL KNEE REVISION;  Surgeon: [name], MD;  Location: [facility];  Service: Orthopedics;                Laterality: Left; 2005: TOTAL KNEE REVISION; Left 06/01/2022: TOTAL KNEE REVISION;  Left     Comment:  Procedure: TOTAL KNEE REVISION;  Surgeon: [name], MD;  Location: [facility];  Service:  Orthopedics;                Laterality: Left; 04/01/2022: TOTAL KNEE REVISION; Left     Comment:  Procedure: Removal of left total knee implants with               insertion of spacer.;  Surgeon: [name], MD;                Location: [facility];  Service: Orthopedics;  Laterality:               Left; No date: TRIGGER FINGER RELEASE; Bilateral     Comment:  SEVERAL FINGERS 06/18/2014: TRIGGER FINGER RELEASE; Right     Comment:  Procedure: RELEASE TRIGGER FINGER/A-1 PULLEY;  Surgeon:               Lynwood SQUIBB Mardee, MD;  Location: [facility];  Service:               Orthopedics;  Laterality: Right;  BMI    Body Mass Index: 24.81 kg/m      Reproductive/Obstetrics negative OB ROS                              Anesthesia Physical Anesthesia Plan  ASA: 3  Anesthesia Plan: General ETT   Post-op Pain Management:    Induction: Intravenous  PONV Risk Score and Plan: Ondansetron , Dexamethasone , Midazolam  and Treatment may vary due to age or medical condition  Airway Management Planned: Oral ETT  Additional Equipment:   Intra-op Plan:   Post-operative Plan: Extubation in OR  Informed Consent: I have reviewed the patients History and Physical, chart, labs and discussed the procedure including the risks, benefits and alternatives for the proposed anesthesia with the patient or authorized representative who has indicated his/her understanding and acceptance.     Dental Advisory Given  Plan Discussed with: Anesthesiologist, CRNA and Surgeon  Anesthesia Plan Comments: (Patient consented for risks of anesthesia including but not limited to:  - adverse reactions to medications - damage to eyes, teeth, lips or other oral mucosa - nerve damage due to positioning  - sore throat or hoarseness - Damage to heart, brain, nerves, lungs, other parts of body or loss of life  Patient voiced understanding and assent.)         Anesthesia Quick Evaluation

## 2023-09-13 NOTE — Interval H&P Note (Signed)
 History and Physical Interval Note:  09/13/2023 11:35 AM  Erik Martinez  has presented today for surgery, with the diagnosis of C18.3 Colon cancer.    K noted to be 2.5.  anesthesia recommended K>3 before proceeding. Will admit and replete.  Clears ok for today, NPO past midnight  Kylii Ennis Tye

## 2023-09-13 NOTE — Consult Note (Signed)
 History and Physical    Erik Martinez FMW:[id number] DOB: 24-Jun-1955 DOA: 09/13/2023  PCP: [name], MD  Patient coming from: pre-op, consult from gen surg for hypokalemia & medical management    HPI:  68 y/o M w/ PMH of CHF, hepatic steatosis, HLD, DM2, GERD, peripheral neuropathy, ED who present for colectomy for colon cancer. Hospitalist service for consulted for hypokalemia and medical management. Pt c/o abd pain that is constant, dull, w/ radiation to the back. The severity of 6/10. Pain meds makes the pain better and moving makes the pain worse. Pt denies any fever, chills, sweating, chest pain, nausea, vomiting, dysuria, urinary urgency, urinary urgency, or constipation.  Review of Systems: As per HPI otherwise 14 point review of systems negative.    Past Medical History:  Diagnosis Date   (HFpEF) heart failure with preserved ejection fraction (HCC)    Adenocarcinoma, colon (HCC) 08/25/2023   Anemia    Anxiety    Aortic atherosclerosis (HCC)    Arthritis    Avascular necrosis of bones of both hips (HCC)    Bipolar disorder (HCC)    Bladder spasms    Chronic pain of left knee    Chronic pain syndrome    a.) followed by pain clinic in [place]   Chronic, continuous use of opioids    a.) managed by pain clinic in [place]   DDD (degenerative disc disease)    Depression    Erectile dysfunction    a.) on PDE5i (tadalafil)   Frequency-urgency syndrome    GERD (gastroesophageal reflux disease)    Hepatitis C 2018   History of anesthesia complications    a.) PONV; b.) PDPH; c.) difficult to sedate   History of hepatitis B    1983  TX'D--  NO ISSUES OR SYMPTOMS SINCE   Hyperlipidemia    Hypotestosteronemia    Insomnia    a.) on hypnotic PRN (zolpidem )   Multiple lung nodules on CT    a.) CT CAP 08/25/2023: along oblique fissures - favored to be benign   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   Neurogenic bladder    Nocturia    PAD (peripheral artery disease) (HCC)     Polysubstance abuse (HCC)    a.) cocaine + marijuana + BZO + opioids   PONV (postoperative nausea and vomiting)    Spinal headache    T2DM (type 2 diabetes mellitus) (HCC)    Urine incontinence     Past Surgical History:  Procedure Laterality Date   APPLICATION OF WOUND VAC Left 09/11/2021   Procedure: APPLICATION OF WOUND VAC;  Surgeon: [name], MD;  Location: [facility]S;  Service: Orthopedics;  Laterality: Left;  [id number]    BACK SURGERY     2 rods and 4 screws artificial disc   COLONOSCOPY  2008   COLONOSCOPY N/A 08/25/2023   Procedure: COLONOSCOPY;  Surgeon: [name], MD;  Location[facility] ENDOSCOPY;  Service: Endoscopy;  Laterality: N/A;   ESOPHAGOGASTRODUODENOSCOPY N/A 08/25/2023   Procedure: EGD (ESOPHAGOGASTRODUODENOSCOPY);  Surgeon: [name], MD;  Location: [facility] ENDOSCOPY;  Service: Endoscopy;  Laterality: N/A;   INTERSTIM IMPLANT PLACEMENT  2007   INTERSTIM IMPLANT REVISION N/A 08/17/2012   Procedure: REPLACMENT OF IPG PLUS REPLACE LEAD OF INTERSTIM IMPLANT ;  Surgeon: [name], MD;  Location: [facility];  Service: Urology;  Laterality: N/A;   IRRIGATION AND DEBRIDEMENT KNEE Left 09/11/2021   Procedure: IRRIGATION AND DEBRIDEMENT WITH POLY EXCHANGE LEFT KNEE;  Surgeon: [name],  MD;  Location: [facility];  Service: Orthopedics;  Laterality: Left;   IRRIGATION AND DEBRIDEMENT KNEE Left 06/01/2022   Procedure: IRRIGATION AND DEBRIDEMENT KNEE;  Surgeon: [name], MD;  Location: [facility];  Service: Orthopedics;  Laterality: Left;   LOWER EXTREMITY ANGIOGRAPHY Left 11/17/2020   Procedure: LOWER EXTREMITY ANGIOGRAPHY;  Surgeon: [name], MD;  Location: [facility];  Service: Cardiovascular;  Laterality: Left;   LOWER EXTREMITY ANGIOGRAPHY Right 12/22/2020   Procedure: LOWER EXTREMITY ANGIOGRAPHY;  Surgeon: [name], MD;  Location: [facility];  Service: Cardiovascular;  Laterality: Right;   LOWER  EXTREMITY ANGIOGRAPHY Left 12/14/2021   Procedure: Lower Extremity Angiography;  Surgeon: [name], MD;  Location: [facility];  Service: Cardiovascular;  Laterality: Left;   LUMBAR DISC SURGERY  2005   L5   LUMBAR FUSION  X2  2006  &  2007   MANIPULATION KNEE JOINT Left 08/2002   OPEN DEBRIDEMENT LEFT TOTAL KNEE (SCAR, BONEY GRAOWTH)/ REMOVAL OLD SUTURES  05/01/2007   PARTIAL HIP ARTHROPLASTY Left 2001   x2   SHOULDER OPEN ROTATOR CUFF REPAIR Left 2000   TOTAL HIP ARTHROPLASTY Left 2002   TOTAL KNEE ARTHROPLASTY Left 06/2002   PARTIAL LEFT KNEE REPLACEMENT PRIOR TO THIS   TOTAL KNEE REVISION Left 2008   TOTAL KNEE REVISION Left 07/19/2021   Procedure: TOTAL KNEE REVISION;  Surgeon: [name], MD;  Location: [facility];  Service: Orthopedics;  Laterality: Left;   TOTAL KNEE REVISION Left 2005   TOTAL KNEE REVISION Left 06/01/2022   Procedure: TOTAL KNEE REVISION;  Surgeon: [name], MD;  Location: [facility];  Service: Orthopedics;  Laterality: Left;   TOTAL KNEE REVISION Left 04/01/2022   Procedure: Removal of left total knee implants with insertion of spacer.;  Surgeon: [name], MD;  Location: [facility];  Service: Orthopedics;  Laterality: Left;   TRIGGER FINGER RELEASE Bilateral    SEVERAL FINGERS   TRIGGER FINGER RELEASE Right 06/18/2014   Procedure: RELEASE TRIGGER FINGER/A-1 PULLEY;  Surgeon: Lynwood SQUIBB Mardee, MD;  Location: [facility];  Service: Orthopedics;  Laterality: Right;     reports that he has never smoked. He has never used smokeless tobacco. He reports that he does not currently use alcohol. He reports that he does not currently use drugs after having used the following drugs: Cocaine.  Allergies  Allergen Reactions   Suboxone [Buprenorphine Hcl-Naloxone  Hcl] Nausea And Vomiting   Tape Other (See Comments)    Whelps *Paper Tape is ok    Codeine Rash, Itching and Nausea Only   Morphine  And Codeine Itching and Rash   Other Rash     Telemetry electrodes   Silicone Rash    Whelps -*Paper Tape is ok     Family History  Problem Relation Age of Onset   Diabetes Mother    Arthritis Mother    Cancer Mother        lung cancer   Hyperlipidemia Mother    Stroke Mother    Alcohol abuse Father    Cancer Father        lung   Arthritis Father    Diabetes Brother      Prior to Admission medications   Medication Sig Start Date End Date Taking? Authorizing Provider  atorvastatin  (LIPITOR) 20 MG tablet Take 20 mg by mouth at bedtime.   Yes [provider]  celecoxib  (CELEBREX ) 200 MG capsule Take 1 capsule (200  mg total) by mouth 2 (two) times daily. 09/01/23  Yes Bedsole, Amy E, MD  Continuous Glucose Sensor (FREESTYLE LIBRE 3 PLUS SENSOR) MISC CHANCE SENSOR EVERY 15 DAYS 08/02/23  Yes Bedsole, Amy E, MD  cyclobenzaprine  (FLEXERIL ) 10 MG tablet Take 10 mg by mouth 3 (three) times daily. 05/04/21  Yes [provider]  empagliflozin  (JARDIANCE ) 25 MG TABS tablet Take 1 tablet (25 mg total) by mouth daily. 01/20/21  Yes [name], Rosaline RAMAN, FNP  furosemide  (LASIX ) 20 MG tablet Take 3 tablets (60 mg total) by mouth daily. 09/04/23 12/03/23 Yes [place] A, FNP  insulin  aspart (NOVOLOG ) 100 UNIT/ML injection Per sliding scale  MAX 15 units in 24 hour period. Patient taking differently: Inject 0-15 Units into the skin 3 (three) times daily as needed for high blood sugar. Per sliding scale  MAX 15 units in 24 hour period. 10/27/22  Yes Bedsole, Amy E, MD  iron  polysaccharides (NIFEREX) 150 MG capsule Take 1 capsule (150 mg total) by mouth daily. 08/26/23  Yes Barbarann Nest, MD  lidocaine  (LIDODERM ) 5 % Place 3 patches onto the skin daily. Remove & Discard patch within 12 hours or as directed by MD Patient taking differently: Place 3 patches onto the skin daily as needed (Pain). Remove & Discard patch within 12 hours or as directed by MD 07/18/23  Yes Bedsole, Amy E, MD  lubiprostone  (AMITIZA ) 8 MCG capsule Take 1  capsule (8 mcg total) by mouth 3 (three) times daily. 09/12/23  Yes Bedsole, Amy E, MD  metolazone  (ZAROXOLYN ) 2.5 MG tablet Take Metolazone  2.5 MG on Tuesday, Wednesday, Thursday 1/2 hour before you take your Lasix . 09/04/23  Yes [place] A, FNP  Oxycodone  HCl 20 MG TABS Take 1 tablet by mouth every 4 (four) hours as needed (Pain).   Yes [provider]  pantoprazole  (PROTONIX ) 40 MG tablet Take 1 tablet (40 mg total) by mouth daily. 11/17/20  Yes Schnier, Cordella MATSU, MD  polyethylene glycol (MIRALAX  / GLYCOLAX ) 17 g packet Take 17 g by mouth daily as needed for mild constipation. 08/25/23  Yes Barbarann Nest, MD  pregabalin  (LYRICA ) 200 MG capsule Take 1 capsule (200 mg total) by mouth in the morning, at noon, and at bedtime. 11/24/21  Yes Bedsole, Amy E, MD  Semaglutide , 2 MG/DOSE, (OZEMPIC , 2 MG/DOSE,) 8 MG/3ML SOPN Inject 2 mg into the skin once a week. Monday 09/01/23  Yes Bedsole, Amy E, MD  spironolactone  (ALDACTONE ) 25 MG tablet Take 1 tablet (25 mg total) by mouth daily. 08/28/23 11/26/23 Yes [place] A, FNP  tadalafil (CIALIS) 20 MG tablet Take 20 mg by mouth daily as needed for erectile dysfunction.   Yes [provider]  terbinafine  (LAMISIL ) 250 MG tablet Take 1 tablet (250 mg total) by mouth daily. 06/01/23  Yes Janit Thresa HERO, DPM  zolpidem  (AMBIEN ) 10 MG tablet Take 1 tablet (10 mg total) by mouth at bedtime as needed. for sleep Patient taking differently: Take 10 mg by mouth at bedtime. for sleep 07/18/23  Yes [name], MD    Physical Exam: Vitals:   09/13/23 0956 09/13/23 1536  BP: (!) 156/68 125/65  Pulse: 81 73  Resp: 16 16  Temp: 98.1 F (36.7 C)   SpO2: 100% 100%  Weight: 76.2 kg   Height: 5' 9 (1.753 m)     Constitutional: NAD, calm, comfortable Vitals:   09/13/23 0956 09/13/23 1536  BP: (!) 156/68 125/65  Pulse: 81 73  Resp: 16 16  Temp: 98.1 F (36.7 C)   SpO2: 100% 100%  Weight: 76.2 kg   Height: 5' 9 (1.753 m)    Eyes:  PERRL, lids and conjunctivae normal ENMT: Mucous membranes are moist.  Neck: normal, supple Respiratory: clear to auscultation bilaterally, no wheezing, no crackles. Normal respiratory effort. No accessory muscle use.  Cardiovascular: Regular rate and rhythm. No rubs or gallops. Abdomen: soft, no tenderness, no masses palpated. Bowel sounds positive.  Musculoskeletal: no clubbing / cyanosis.  Good ROM. Skin: no rashes, lesions, ulcers.  Neurologic: CN 2-12 grossly intact.  Psychiatric: Normal judgment and insight. Alert and oriented x 3. Flat mood and affect.    Labs on Admission: I have personally reviewed following labs and imaging studies  CBC: Recent Labs  Lab 09/08/23 1216 09/13/23 1019  WBC 5.5  --   HGB 8.8* 9.5*  HCT 28.5* 28.0*  MCV 65.5*  --   PLT 397  --    Basic Metabolic Panel: Recent Labs  Lab 09/13/23 1019 09/13/23 1037  NA 129*  --   K 2.5* 2.6*  CL 86*  --   GLUCOSE 211*  --   BUN 25*  --   CREATININE 1.40*  --   MG  --  2.3   GFR: Estimated Creatinine Clearance: 51.9 mL/min (A) (by C-G formula based on SCr of 1.4 mg/dL (H)). Liver Function Tests: No results for input(s): AST, ALT, ALKPHOS, BILITOT, PROT, ALBUMIN in the last 168 hours. No results for input(s): LIPASE, AMYLASE in the last 168 hours. No results for input(s): AMMONIA in the last 168 hours. Coagulation Profile: No results for input(s): INR, PROTIME in the last 168 hours. Cardiac Enzymes: No results for input(s): CKTOTAL, CKMB, CKMBINDEX, TROPONINI in the last 168 hours. BNP (last 3 results) Recent Labs    08/21/23 1016  PROBNP 1,787.0*   HbA1C: No results for input(s): HGBA1C in the last 72 hours. CBG: Recent Labs  Lab 09/13/23 1727  GLUCAP 181*   Lipid Profile: No results for input(s): CHOL, HDL, LDLCALC, TRIG, CHOLHDL, LDLDIRECT in the last 72 hours. Thyroid  Function Tests: No results for input(s): TSH, T4TOTAL, FREET4,  T3FREE, THYROIDAB in the last 72 hours. Anemia Panel: No results for input(s): VITAMINB12, FOLATE, FERRITIN, TIBC, IRON , RETICCTPCT in the last 72 hours. Urine analysis:    Component Value Date/Time   COLORURINE STRAW (A) 08/23/2023 1450   APPEARANCEUR CLEAR (A) 08/23/2023 1450   APPEARANCEUR Turbid 05/20/2013 0304   LABSPEC 1.006 08/23/2023 1450   LABSPEC 1.017 05/20/2013 0304   PHURINE 7.0 08/23/2023 1450   GLUCOSEU >=500 (A) 08/23/2023 1450   GLUCOSEU >=500 05/20/2013 0304   HGBUR NEGATIVE 08/23/2023 1450   BILIRUBINUR NEGATIVE 08/23/2023 1450   BILIRUBINUR Negative 08/18/2023 1655   BILIRUBINUR Negative 05/20/2013 0304   KETONESUR NEGATIVE 08/23/2023 1450   PROTEINUR NEGATIVE 08/23/2023 1450   UROBILINOGEN 1.0 08/18/2023 1655   UROBILINOGEN 0.2 04/30/2007 1140   NITRITE NEGATIVE 08/23/2023 1450   LEUKOCYTESUR NEGATIVE 08/23/2023 1450   LEUKOCYTESUR Negative 05/20/2013 0304    Radiological Exams on Admission: No results found.  EKG: Independently reviewed.   Assessment/Plan Principal Problem:   Hypokalemia Active Problems:   Colon cancer (HCC)  Hypokalemia: likely secondary to bowel prep for surg. Potassium given. Repeat potassium level order. Potassium needs to be at least 3 to have surg as per gen surg  Colon cancer: likely etiology of abd pain. Will go for possible have colectomy tomorrow if potassium is at least 3 as per gen surg  AKI: baseline Cr is unknown. 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 Transfuse for Hb < 7.0. Will start iron  supplements  Hyponatremia: likely secondary to bowel prep for above surg. Will continue to monitor   Chronic diastolic CHF: will hold home dose of lasix , metolazone  as Cr is elevated. Monitor I/Os. Continue on home dose of aldactone   HLD: continue on statin  DM2: fair control, HbA1c 7.2 in July 2025. Started on SSI w/ accuchecks. Holding all home oral anti-DM2  meds  Peripheral neuropathy: continue on home dose of pregabalin      DVT prophylaxis: SCDs Code Status: full Family Communication: no family at bedside Disposition Plan: as per primary team, gen surg Consults called: hospitalist  Admission status: inpatient   Anthony CHRISTELLA Pouch MD [facility]   If 7PM-7AM, please contact night-coverage [email or web address]   09/13/2023, 5:39 PM

## 2023-09-14 ENCOUNTER — Inpatient Hospital Stay

## 2023-09-14 ENCOUNTER — Encounter: Payer: Self-pay | Admitting: Surgery

## 2023-09-14 ENCOUNTER — Inpatient Hospital Stay: Admission: RE | Admit: 2023-09-14 | Source: Home / Self Care | Admitting: Surgery

## 2023-09-14 ENCOUNTER — Other Ambulatory Visit: Payer: Self-pay

## 2023-09-14 ENCOUNTER — Encounter
Admission: RE | Disposition: A | Discharge status: Home / Self Care | Payer: Self-pay | Source: Home / Self Care | Attending: Surgery

## 2023-09-14 DIAGNOSIS — C189 Malignant neoplasm of colon, unspecified: Secondary | ICD-10-CM | POA: Diagnosis not present

## 2023-09-14 LAB — CBC
HCT: 25.5 % — ABNORMAL LOW (ref 39.0–52.0)
Hemoglobin: 7.5 g/dL — ABNORMAL LOW (ref 13.0–17.0)
MCH: 20 pg — ABNORMAL LOW (ref 26.0–34.0)
MCHC: 29.4 g/dL — ABNORMAL LOW (ref 30.0–36.0)
MCV: 68 fL — ABNORMAL LOW (ref 80.0–100.0)
Platelets: 219 K/uL (ref 150–400)
RBC: 3.75 MIL/uL — ABNORMAL LOW (ref 4.22–5.81)
RDW: 21 % — ABNORMAL HIGH (ref 11.5–15.5)
WBC: 5 K/uL (ref 4.0–10.5)
nRBC: 0 % (ref 0.0–0.2)

## 2023-09-14 LAB — GLUCOSE, CAPILLARY
Glucose-Capillary: 118 mg/dL — ABNORMAL HIGH (ref 70–99)
Glucose-Capillary: 119 mg/dL — ABNORMAL HIGH (ref 70–99)
Glucose-Capillary: 130 mg/dL — ABNORMAL HIGH (ref 70–99)
Glucose-Capillary: 167 mg/dL — ABNORMAL HIGH (ref 70–99)
Glucose-Capillary: 190 mg/dL — ABNORMAL HIGH (ref 70–99)

## 2023-09-14 LAB — COMPREHENSIVE METABOLIC PANEL WITH GFR
ALT: 13 U/L (ref 0–44)
AST: 18 U/L (ref 15–41)
Albumin: 2.6 g/dL — ABNORMAL LOW (ref 3.5–5.0)
Alkaline Phosphatase: 63 U/L (ref 38–126)
Anion gap: 11 (ref 5–15)
BUN: 19 mg/dL (ref 8–23)
CO2: 28 mmol/L (ref 22–32)
Calcium: 8 mg/dL — ABNORMAL LOW (ref 8.9–10.3)
Chloride: 93 mmol/L — ABNORMAL LOW (ref 98–111)
Creatinine, Ser: 1.07 mg/dL (ref 0.61–1.24)
GFR, Estimated: >60 mL/min (ref >60)
Glucose, Bld: 112 mg/dL — ABNORMAL HIGH (ref 70–99)
Potassium: 3.4 mmol/L — ABNORMAL LOW (ref 3.5–5.1)
Sodium: 132 mmol/L — ABNORMAL LOW (ref 135–145)
Total Bilirubin: 0.8 mg/dL (ref 0.0–1.2)
Total Protein: 6.2 g/dL — ABNORMAL LOW (ref 6.5–8.1)

## 2023-09-14 LAB — PREPARE RBC (CROSSMATCH)

## 2023-09-14 SURGERY — COLECTOMY, PARTIAL, ROBOT-ASSISTED, LAPAROSCOPIC
Anesthesia: General | Site: Abdomen | Laterality: Right

## 2023-09-14 MED ORDER — DEXAMETHASONE SODIUM PHOSPHATE 10 MG/ML IJ SOLN
INTRAMUSCULAR | Status: AC
  Filled 2023-09-14: qty 1

## 2023-09-14 MED ORDER — FENTANYL CITRATE (PF) 100 MCG/2ML IJ SOLN
INTRAMUSCULAR | Status: DC | PRN
  Administered 2023-09-14 (×2): 50 ug via INTRAVENOUS

## 2023-09-14 MED ORDER — SODIUM CHLORIDE 0.9% IV SOLUTION: Freq: Once | INTRAVENOUS | Status: DC

## 2023-09-14 MED ORDER — CHLORHEXIDINE GLUCONATE 0.12 % MT SOLN
15.0000 mL | Freq: Once | OROMUCOSAL | Status: AC
  Administered 2023-09-14: 15 mL via OROMUCOSAL

## 2023-09-14 MED ORDER — MIDAZOLAM HCL 2 MG/2ML IJ SOLN
INTRAMUSCULAR | Status: AC
  Filled 2023-09-14: qty 2

## 2023-09-14 MED ORDER — DEXMEDETOMIDINE HCL IN NACL 80 MCG/20ML IV SOLN
INTRAVENOUS | Status: DC | PRN
Start: 2023-09-14 — End: 2023-09-14
  Administered 2023-09-14 (×3): 4 ug via INTRAVENOUS

## 2023-09-14 MED ORDER — BUPIVACAINE-EPINEPHRINE 0.5% -1:200000 IJ SOLN
INTRAMUSCULAR | Status: DC | PRN
  Administered 2023-09-14: 30 mL via INTRAMUSCULAR

## 2023-09-14 MED ORDER — KETAMINE HCL 50 MG/5ML IJ SOSY
PREFILLED_SYRINGE | INTRAMUSCULAR | Status: AC
  Filled 2023-09-14: qty 5

## 2023-09-14 MED ORDER — CHLORHEXIDINE GLUCONATE 0.12 % MT SOLN
OROMUCOSAL | Status: AC
  Filled 2023-09-14: qty 15

## 2023-09-14 MED ORDER — LACTATED RINGERS IV SOLN: INTRAVENOUS | Status: DC | PRN

## 2023-09-14 MED ORDER — PHENYLEPHRINE 80 MCG/ML (10ML) SYRINGE FOR IV PUSH (FOR BLOOD PRESSURE SUPPORT)
PREFILLED_SYRINGE | INTRAVENOUS | Status: DC | PRN
  Administered 2023-09-14: 80 ug via INTRAVENOUS

## 2023-09-14 MED ORDER — HYDROCODONE-ACETAMINOPHEN 10-325 MG PO TABS: 2.0000 | ORAL_TABLET | ORAL | Status: DC | PRN

## 2023-09-14 MED ORDER — SUGAMMADEX SODIUM 200 MG/2ML IV SOLN
INTRAVENOUS | Status: DC | PRN
  Administered 2023-09-14 (×2): 200 mg via INTRAVENOUS

## 2023-09-14 MED ORDER — CALCIUM GLUCONATE-NACL 1-0.675 GM/50ML-% IV SOLN
1.0000 g | INTRAVENOUS | Status: AC
  Administered 2023-09-14: 1 g via INTRAVENOUS
  Filled 2023-09-14: qty 50

## 2023-09-14 MED ORDER — ROCURONIUM BROMIDE 100 MG/10ML IV SOLN
INTRAVENOUS | Status: DC | PRN
  Administered 2023-09-14: 15 mg via INTRAVENOUS
  Administered 2023-09-14: 50 mg via INTRAVENOUS
  Administered 2023-09-14: 20 mg via INTRAVENOUS

## 2023-09-14 MED ORDER — SODIUM CHLORIDE 0.9 % IV SOLN
INTRAVENOUS | Status: AC
  Filled 2023-09-14: qty 2

## 2023-09-14 MED ORDER — FENTANYL CITRATE (PF) 100 MCG/2ML IJ SOLN
INTRAMUSCULAR | Status: AC
Start: 2023-09-14 — End: 2023-09-14
  Filled 2023-09-14: qty 2

## 2023-09-14 MED ORDER — ONDANSETRON HCL 4 MG/2ML IJ SOLN
INTRAMUSCULAR | Status: DC | PRN
  Administered 2023-09-14: 4 mg via INTRAVENOUS

## 2023-09-14 MED ORDER — FENTANYL CITRATE (PF) 100 MCG/2ML IJ SOLN: 25.0000 ug | INTRAMUSCULAR | Status: DC | PRN

## 2023-09-14 MED ORDER — MIDAZOLAM HCL 2 MG/2ML IJ SOLN
INTRAMUSCULAR | Status: DC | PRN
  Administered 2023-09-14: 2 mg via INTRAVENOUS

## 2023-09-14 MED ORDER — ONDANSETRON HCL 4 MG/2ML IJ SOLN
INTRAMUSCULAR | Status: AC
  Filled 2023-09-14: qty 2

## 2023-09-14 MED ORDER — SODIUM CHLORIDE 0.9 % IV SOLN
INTRAVENOUS | Status: DC | PRN
Start: 2023-09-14 — End: 2023-09-14
  Administered 2023-09-14: 2 g via INTRAVENOUS

## 2023-09-14 MED ORDER — INDOCYANINE GREEN 25 MG IV SOLR
INTRAVENOUS | Status: DC | PRN
Start: 2023-09-14 — End: 2023-09-14
  Administered 2023-09-14: 5 mg via INTRAVENOUS

## 2023-09-14 MED ORDER — ARTIFICIAL TEARS OPHTHALMIC OINT
TOPICAL_OINTMENT | OPHTHALMIC | Status: AC
  Filled 2023-09-14: qty 3.5

## 2023-09-14 MED ORDER — OXYCODONE HCL 5 MG/5ML PO SOLN: 5.0000 mg | Freq: Once | ORAL | Status: DC | PRN

## 2023-09-14 MED ORDER — DEXAMETHASONE SODIUM PHOSPHATE 10 MG/ML IJ SOLN
INTRAMUSCULAR | Status: DC | PRN
  Administered 2023-09-14: 4 mg via INTRAVENOUS

## 2023-09-14 MED ORDER — HYDROMORPHONE HCL 1 MG/ML IJ SOLN
INTRAMUSCULAR | Status: AC
Start: 2023-09-14 — End: 2023-09-14
  Filled 2023-09-14: qty 1

## 2023-09-14 MED ORDER — HYDROCODONE-ACETAMINOPHEN 10-325 MG PO TABS
1.0000 | ORAL_TABLET | ORAL | Status: DC | PRN
  Administered 2023-09-14: 1 via ORAL
  Filled 2023-09-14 (×3): qty 1

## 2023-09-14 MED ORDER — METRONIDAZOLE 500 MG/100ML IV SOLN
500.0000 mg | Freq: Two times a day (BID) | INTRAVENOUS | Status: AC
  Administered 2023-09-14 – 2023-09-18 (×10): 500 mg via INTRAVENOUS
  Filled 2023-09-14 (×10): qty 100

## 2023-09-14 MED ORDER — ROCURONIUM BROMIDE 10 MG/ML (PF) SYRINGE
PREFILLED_SYRINGE | INTRAVENOUS | Status: AC
  Filled 2023-09-14: qty 10

## 2023-09-14 MED ORDER — SEVOFLURANE IN SOLN
RESPIRATORY_TRACT | Status: AC
  Filled 2023-09-14: qty 250

## 2023-09-14 MED ORDER — SODIUM CHLORIDE 0.9 % IV SOLN
2.0000 g | Freq: Every day | INTRAVENOUS | Status: AC
  Administered 2023-09-14 – 2023-09-18 (×5): 2 g via INTRAVENOUS
  Filled 2023-09-14 (×5): qty 20

## 2023-09-14 MED ORDER — LIDOCAINE HCL (PF) 2 % IJ SOLN
INTRAMUSCULAR | Status: AC
  Filled 2023-09-14: qty 5

## 2023-09-14 MED ORDER — DEXMEDETOMIDINE HCL IN NACL 80 MCG/20ML IV SOLN
INTRAVENOUS | Status: AC
  Filled 2023-09-14: qty 20

## 2023-09-14 MED ORDER — LIDOCAINE HCL (CARDIAC) PF 100 MG/5ML IV SOSY
PREFILLED_SYRINGE | INTRAVENOUS | Status: DC | PRN
  Administered 2023-09-14: 60 mg via INTRAVENOUS

## 2023-09-14 MED ORDER — ENOXAPARIN SODIUM 40 MG/0.4ML IJ SOSY
40.0000 mg | PREFILLED_SYRINGE | INTRAMUSCULAR | Status: DC
  Administered 2023-09-15 – 2023-09-22 (×8): 40 mg via SUBCUTANEOUS
  Filled 2023-09-14 (×8): qty 0.4

## 2023-09-14 MED ORDER — OXYCODONE HCL 5 MG PO TABS: 5.0000 mg | ORAL_TABLET | Freq: Once | ORAL | Status: DC | PRN

## 2023-09-14 MED ORDER — HYDROMORPHONE HCL 1 MG/ML IJ SOLN
INTRAMUSCULAR | Status: DC | PRN
  Administered 2023-09-14 (×2): .5 mg via INTRAVENOUS

## 2023-09-14 MED ORDER — ACETAMINOPHEN 10 MG/ML IV SOLN
INTRAVENOUS | Status: DC | PRN
  Administered 2023-09-14: 1000 mg via INTRAVENOUS

## 2023-09-14 MED ORDER — PROPOFOL 10 MG/ML IV BOLUS
INTRAVENOUS | Status: AC
  Filled 2023-09-14: qty 20

## 2023-09-14 MED ORDER — BUPIVACAINE-EPINEPHRINE (PF) 0.5% -1:200000 IJ SOLN
INTRAMUSCULAR | Status: AC
  Filled 2023-09-14: qty 30

## 2023-09-14 MED ORDER — MORPHINE SULFATE ER 15 MG PO TBCR: 30.0000 mg | EXTENDED_RELEASE_TABLET | Freq: Two times a day (BID) | ORAL | Status: DC

## 2023-09-14 MED ORDER — PROPOFOL 10 MG/ML IV BOLUS
INTRAVENOUS | Status: DC | PRN
  Administered 2023-09-14: 200 mg via INTRAVENOUS

## 2023-09-14 MED ORDER — KETAMINE HCL 10 MG/ML IJ SOLN
INTRAMUSCULAR | Status: DC | PRN
Start: 2023-09-14 — End: 2023-09-14
  Administered 2023-09-14 (×3): 20 mg via INTRAVENOUS

## 2023-09-14 MED ORDER — ACETAMINOPHEN 10 MG/ML IV SOLN
INTRAVENOUS | Status: AC
Start: 2023-09-14 — End: 2023-09-14
  Filled 2023-09-14: qty 100

## 2023-09-14 MED ORDER — HYDROMORPHONE HCL 1 MG/ML IJ SOLN
1.0000 mg | INTRAMUSCULAR | Status: DC | PRN
  Administered 2023-09-14 – 2023-09-22 (×38): 1 mg via INTRAVENOUS
  Filled 2023-09-14 (×38): qty 1

## 2023-09-14 MED ORDER — SODIUM CHLORIDE 0.9 % IV SOLN
INTRAVENOUS | Status: DC | PRN
Start: 2023-09-14 — End: 2023-09-14

## 2023-09-14 SURGICAL SUPPLY — 62 items
BASIN KIT SINGLE STR (MISCELLANEOUS) ×1 IMPLANT
BLADE CLIPPER SURG (BLADE) ×1 IMPLANT
CANNULA CAP OBTURATR AIRSEAL 8 (CAP) ×1 IMPLANT
COVER TIP SHEARS 8 DVNC (MISCELLANEOUS) ×1 IMPLANT
DEFOGGER SCOPE WARM SEASHARP (MISCELLANEOUS) ×1 IMPLANT
DERMABOND ADVANCED .7 DNX12 (GAUZE/BANDAGES/DRESSINGS) IMPLANT
DRAIN CHANNEL JP 15F RND 3/16 (MISCELLANEOUS) IMPLANT
DRAPE ARM DVNC X/XI (DISPOSABLE) ×4 IMPLANT
DRAPE COLUMN DVNC XI (DISPOSABLE) ×1 IMPLANT
DRSG OPSITE POSTOP 4X10 (GAUZE/BANDAGES/DRESSINGS) IMPLANT
DRSG OPSITE POSTOP 4X8 (GAUZE/BANDAGES/DRESSINGS) IMPLANT
ELECTRODE REM PT RTRN 9FT ADLT (ELECTROSURGICAL) ×1 IMPLANT
EVACUATOR SILICONE 100CC (DRAIN) IMPLANT
FORCEPS BPLR FENES DVNC XI (FORCEP) ×1 IMPLANT
GLOVE BIOGEL PI IND STRL 7.0 (GLOVE) ×3 IMPLANT
GLOVE SURG SYN 6.5 PF PI (GLOVE) ×5 IMPLANT
GOWN STRL REUS W/ TWL LRG LVL3 (GOWN DISPOSABLE) ×7 IMPLANT
GRASPER SUT TROCAR 14GX15 (MISCELLANEOUS) IMPLANT
GRASPER TIP-UP FEN DVNC XI (INSTRUMENTS) ×1 IMPLANT
HANDLE YANKAUER SUCT BULB TIP (MISCELLANEOUS) ×1 IMPLANT
IRRIGATION STRYKERFLOW (MISCELLANEOUS) IMPLANT
IRRIGATOR SUCT 8 DISP DVNC XI (IRRIGATION / IRRIGATOR) IMPLANT
IV NS 1000ML BAXH (IV SOLUTION) IMPLANT
KIT IMAGING PINPOINTPAQ (MISCELLANEOUS) ×1 IMPLANT
KIT PINK PAD W/HEAD ARM REST (MISCELLANEOUS) ×1 IMPLANT
LABEL OR SOLS (LABEL) IMPLANT
MANIFOLD NEPTUNE II (INSTRUMENTS) ×1 IMPLANT
NDL DRIVE SUT CUT DVNC (INSTRUMENTS) ×1 IMPLANT
NDL HYPO 22X1.5 SAFETY MO (MISCELLANEOUS) ×1 IMPLANT
NDL INSUFFLATION 14GA 120MM (NEEDLE) ×1 IMPLANT
NEEDLE DRIVE SUT CUT DVNC (INSTRUMENTS) ×1 IMPLANT
NEEDLE HYPO 22X1.5 SAFETY MO (MISCELLANEOUS) ×1 IMPLANT
NEEDLE INSUFFLATION 14GA 120MM (NEEDLE) ×1 IMPLANT
OBTURATOR OPTICALSTD 8 DVNC (TROCAR) ×1 IMPLANT
PACK COLON CLEAN CLOSURE (MISCELLANEOUS) ×1 IMPLANT
PACK LAP CHOLECYSTECTOMY (MISCELLANEOUS) ×1 IMPLANT
RELOAD STAPLE 60 3.5 BLU DVNC (STAPLE) IMPLANT
RETRACTOR WOUND ALXS 18CM MED (MISCELLANEOUS) IMPLANT
SCISSORS MNPLR CVD DVNC XI (INSTRUMENTS) ×1 IMPLANT
SEAL UNIV 5-12 XI (MISCELLANEOUS) ×3 IMPLANT
SEALER VESSEL EXT DVNC XI (MISCELLANEOUS) IMPLANT
SET TUBE FILTERED XL AIRSEAL (SET/KITS/TRAYS/PACK) ×1 IMPLANT
SET TUBE SMOKE EVAC HIGH FLOW (TUBING) IMPLANT
SOLUTION ELECTROSURG ANTI STCK (MISCELLANEOUS) ×1 IMPLANT
SPONGE T-LAP 18X18 ~~LOC~~+RFID (SPONGE) ×1 IMPLANT
SPONGE T-LAP 4X18 ~~LOC~~+RFID (SPONGE) IMPLANT
STAPLER 60 SUREFORM DVNC (STAPLE) IMPLANT
STAPLER SKIN PROX 35W (STAPLE) IMPLANT
SUT PDS AB 1 CT1 36 (SUTURE) ×2 IMPLANT
SUT SILK 3 0 SH 30 (SUTURE) IMPLANT
SUT STRATA 3-0 23 RB-1.5 (SUTURE) IMPLANT
SUT VIC AB 3-0 SH 27X BRD (SUTURE) IMPLANT
SUT VICRYL 0 UR6 27IN ABS (SUTURE) ×1 IMPLANT
SUTURE MNCRL 4-0 27XMF (SUTURE) ×1 IMPLANT
SYR 30ML LL (SYRINGE) ×2 IMPLANT
SYSTEM LAPSCP GELPORT 120MM (MISCELLANEOUS) IMPLANT
SYSTEM WECK SHIELD CLOSURE (TROCAR) IMPLANT
TRAP FLUID SMOKE EVACUATOR (MISCELLANEOUS) ×1 IMPLANT
TRAY FOLEY MTR SLVR 16FR STAT (SET/KITS/TRAYS/PACK) ×1 IMPLANT
ULTRASOUND BK UROLOGY (MISCELLANEOUS) ×1 IMPLANT
ULTRASOUND BK UROLOGY PROCEDUR (MISCELLANEOUS) IMPLANT
WATER STERILE IRR 500ML POUR (IV SOLUTION) ×1 IMPLANT

## 2023-09-14 NOTE — Anesthesia Procedure Notes (Signed)
 Procedure Name: Intubation Date/Time: 09/14/2023 9:41 AM  Performed by: Trudy Rankin LABOR, CRNAPre-anesthesia Checklist: Patient identified, Patient being monitored, Timeout performed, Emergency Drugs available and Suction available Patient Re-evaluated:Patient Re-evaluated prior to induction Oxygen Delivery Method: Circle System Utilized Preoxygenation: Pre-oxygenation with 100% oxygen Induction Type: IV induction and Rapid sequence Laryngoscope Size: Mac, 3 and McGrath Grade View: Grade I Tube type: Oral Tube size: 7.0 mm Number of attempts: 1 Airway Equipment and Method: Stylet Placement Confirmation: ETT inserted through vocal cords under direct vision, positive ETCO2 and breath sounds checked- equal and bilateral Secured at: 21 cm Tube secured with: Tape Dental Injury: Teeth and Oropharynx as per pre-operative assessment

## 2023-09-14 NOTE — Anesthesia Preprocedure Evaluation (Signed)
 Anesthesia Evaluation  Patient identified by MRN, date of birth, ID band Patient awake    Reviewed: Allergy & Precautions, NPO status , Patient's Chart, lab work & pertinent test results  History of Anesthesia Complications (+) PONV and history of anesthetic complications  Airway Mallampati: III  TM Distance: >3 FB Neck ROM: full    Dental  (+) Partial Lower, Upper Dentures, Poor Dentition, Chipped, Implants, Dental Advidsory Given   Pulmonary neg pulmonary ROS, neg shortness of breath   Pulmonary exam normal        Cardiovascular Exercise Tolerance: Good (-) hypertension(-) angina + Peripheral Vascular Disease and +CHF  (-) Past MI and (-) Cardiac Stents Normal cardiovascular exam(-) dysrhythmias (-) Valvular Problems/Murmurs     Neuro/Psych  Headaches, neg Seizures PSYCHIATRIC DISORDERS Anxiety Depression Bipolar Disorder    Neuromuscular disease    GI/Hepatic ,GERD  ,,(+) Hepatitis -, C  Endo/Other  diabetes, Type 2    Renal/GU      Musculoskeletal   Abdominal   Peds  Hematology  (+) Blood dyscrasia, anemia   Anesthesia Other Findings Patient has cardiac clearance for this procedure.   Past Medical History: No date: (HFpEF) heart failure with preserved ejection fraction (HCC) 08/25/2023: Adenocarcinoma, colon (HCC) No date: Anemia No date: Anxiety No date: Aortic atherosclerosis (HCC) No date: Arthritis No date: Avascular necrosis of bones of both hips (HCC) No date: Bipolar disorder (HCC) No date: Bladder spasms No date: Chronic pain of left knee No date: Chronic pain syndrome     Comment:  a.) followed by pain clinic in [place] No date: Chronic, continuous use of opioids     Comment:  a.) managed by pain clinic in [place] No date: DDD (degenerative disc disease) No date: Depression No date: Erectile dysfunction     Comment:  a.) on PDE5i (tadalafil) No date: Frequency-urgency syndrome No date: GERD  (gastroesophageal reflux disease) 2018: Hepatitis C No date: History of anesthesia complications     Comment:  a.) PONV; b.) PDPH; c.) difficult to sedate No date: History of hepatitis B     Comment:  1983  TX'D--  NO ISSUES OR SYMPTOMS SINCE No date: Hyperlipidemia No date: Hypotestosteronemia No date: Insomnia     Comment:  a.) on hypnotic PRN (zolpidem ) No date: Multiple lung nodules on CT     Comment:  a.) CT CAP 08/25/2023: along oblique fissures - favored               to be benign 09/08/2015: 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HCC) No date: Neurogenic bladder No date: Nocturia No date: PAD (peripheral artery disease) (HCC) No date: Polysubstance abuse (HCC)     Comment:  a.) cocaine + marijuana + BZO + opioids No date: PONV (postoperative nausea and vomiting) No date: Spinal headache No date: T2DM (type 2 diabetes mellitus) (HCC) No date: Urine incontinence  Past Surgical History: 09/11/2021: APPLICATION OF WOUND VAC; Left     Comment:  Procedure: APPLICATION OF WOUND VAC;  Surgeon: [name], MD;  Location: [facility];  Service: Orthopedics;               Laterality: Left;  [id number]  No date: BACK SURGERY     Comment:  2 rods and 4 screws artificial disc 2008: COLONOSCOPY 08/25/2023: COLONOSCOPY; N/A     Comment:  Procedure: COLONOSCOPY;  Surgeon: [name], MD;  Location: [facility] ENDOSCOPY;  Service: Endoscopy;                Laterality: N/A; 08/25/2023: ESOPHAGOGASTRODUODENOSCOPY; N/A     Comment:  Procedure: EGD (ESOPHAGOGASTRODUODENOSCOPY);  Surgeon:               [name], MD;  Location: [facility] ENDOSCOPY;  Service:               Endoscopy;  Laterality: N/A; 2007: INTERSTIM IMPLANT PLACEMENT 08/17/2012: INTERSTIM IMPLANT REVISION; N/A     Comment:  Procedure: REPLACMENT OF IPG PLUS REPLACE LEAD OF               INTERSTIM IMPLANT ;  Surgeon: [name], MD;                Location: [facility];  Service: Urology;                Laterality: N/A; 09/11/2021: IRRIGATION AND DEBRIDEMENT KNEE; Left     Comment:  Procedure: IRRIGATION AND DEBRIDEMENT WITH POLY EXCHANGE              LEFT KNEE;  Surgeon: [name], MD;  Location: [facility]              ORS;  Service: Orthopedics;  Laterality: Left; 06/01/2022: IRRIGATION AND DEBRIDEMENT KNEE; Left     Comment:  Procedure: IRRIGATION AND DEBRIDEMENT KNEE;  Surgeon:               [name], MD;  Location: [facility];  Service:               Orthopedics;  Laterality: Left; 11/17/2020: LOWER EXTREMITY ANGIOGRAPHY; Left     Comment:  Procedure: LOWER EXTREMITY ANGIOGRAPHY;  Surgeon:               [name], MD;  Location: [facility];               Service: Cardiovascular;  Laterality: Left; 12/22/2020: LOWER EXTREMITY ANGIOGRAPHY; Right     Comment:  Procedure: LOWER EXTREMITY ANGIOGRAPHY;  Surgeon:               [name], MD;  Location: [facility];               Service: Cardiovascular;  Laterality: Right; 12/14/2021: LOWER EXTREMITY ANGIOGRAPHY; Left     Comment:  Procedure: Lower Extremity Angiography;  Surgeon:               [name], MD;  Location: [facility];               Service: Cardiovascular;  Laterality: Left; 2005: LUMBAR DISC SURGERY     Comment:  L5 X2  2006  &  2007: LUMBAR FUSION 08/2002: MANIPULATION KNEE JOINT; Left 05/01/2007: OPEN DEBRIDEMENT LEFT TOTAL KNEE (SCAR, BONEY GRAOWTH)/  REMOVAL OLD SUTURES 2001: PARTIAL HIP ARTHROPLASTY; Left     Comment:  x2 2000: SHOULDER OPEN ROTATOR CUFF REPAIR; Left 2002: TOTAL HIP ARTHROPLASTY; Left 06/2002: TOTAL KNEE ARTHROPLASTY; Left     Comment:  PARTIAL LEFT KNEE REPLACEMENT PRIOR TO THIS 2008: TOTAL KNEE REVISION; Left 07/19/2021: TOTAL KNEE REVISION; Left     Comment:  Procedure: TOTAL KNEE REVISION;  Surgeon: [name], MD;  Location: [facility];  Service: Orthopedics;  Laterality: Left; 2005: TOTAL  KNEE REVISION; Left 06/01/2022: TOTAL KNEE REVISION; Left     Comment:  Procedure: TOTAL KNEE REVISION;  Surgeon: [name], MD;  Location: [facility];  Service: Orthopedics;                Laterality: Left; 04/01/2022: TOTAL KNEE REVISION; Left     Comment:  Procedure: Removal of left total knee implants with               insertion of spacer.;  Surgeon: [name], MD;                Location: [facility];  Service: Orthopedics;  Laterality:               Left; No date: TRIGGER FINGER RELEASE; Bilateral     Comment:  SEVERAL FINGERS 06/18/2014: TRIGGER FINGER RELEASE; Right     Comment:  Procedure: RELEASE TRIGGER FINGER/A-1 PULLEY;  Surgeon:               Lynwood SQUIBB Mardee, MD;  Location: [facility];  Service:               Orthopedics;  Laterality: Right;  BMI    Body Mass Index: 24.81 kg/m      Reproductive/Obstetrics negative OB ROS                              Anesthesia Physical Anesthesia Plan  ASA: 3  Anesthesia Plan: General   Post-op Pain Management:    Induction: Intravenous  PONV Risk Score and Plan: Ondansetron , Dexamethasone , Midazolam , Treatment may vary due to age or medical condition and Droperidol   Airway Management Planned: Oral ETT  Additional Equipment:   Intra-op Plan:   Post-operative Plan: Extubation in OR  Informed Consent: I have reviewed the patients History and Physical, chart, labs and discussed the procedure including the risks, benefits and alternatives for the proposed anesthesia with the patient or authorized representative who has indicated his/her understanding and acceptance.     Dental Advisory Given  Plan Discussed with: Anesthesiologist, CRNA and Surgeon  Anesthesia Plan Comments: (Patient consented for risks of anesthesia including but not limited to:  - adverse reactions to medications - damage to eyes, teeth, lips or other oral mucosa - nerve damage due to positioning  - sore  throat or hoarseness - Damage to heart, brain, nerves, lungs, other parts of body or loss of life  Patient voiced understanding and assent.)         Anesthesia Quick Evaluation

## 2023-09-14 NOTE — Interval H&P Note (Signed)
 History and Physical Interval Note:  09/14/2023 8:51 AM  Erik Martinez  has presented today for surgery, with the diagnosis of C18.3 Colon cancer.  The various methods of treatment have been discussed with the patient and family. After consideration of risks, benefits and other options for treatment, the patient has consented to  Procedure(s): COLECTOMY, PARTIAL, ROBOT-ASSISTED, LAPAROSCOPIC (Right) as a surgical intervention.  The patient's history has been reviewed, patient examined, no change in status, stable for surgery.  I have reviewed the patient's chart and labs.  Questions were answered to the patient's satisfaction.    Potassium normalized.  Hemoglobin did drop to 7.5 though.  In anticipation of the estimated blood loss, we will go ahead and transfuse 1 unit preop.  Otherwise we will proceed as originally planned.  Erik Martinez

## 2023-09-14 NOTE — Transfer of Care (Signed)
 Immediate Anesthesia Transfer of Care Note  Patient: Erik Martinez  Procedure(s) Performed: COLECTOMY, PARTIAL, ROBOT-ASSISTED, LAPAROSCOPIC (Right: Abdomen)  Patient Location: PACU  Anesthesia Type:General  Level of Consciousness: awake and drowsy  Airway & Oxygen Therapy: Patient Spontanous Breathing and Patient connected to face mask oxygen  Post-op Assessment: Report given to RN and Post -op Vital signs reviewed and stable  Post vital signs: Reviewed and stable  Last Vitals:  Vitals Value Taken Time  BP 122/63 09/14/23 12:51  Temp 36.5 C 09/14/23 12:51  Pulse 86 09/14/23 12:54  Resp 24 09/14/23 12:54  SpO2 100 % 09/14/23 12:54  Vitals shown include unfiled device data.  Last Pain:  Vitals:   09/14/23 1251  TempSrc:   PainSc: Asleep         Complications: No notable events documented.

## 2023-09-14 NOTE — Op Note (Signed)
 Preoperative diagnosis: Right colon cancer  Postoperative diagnosis: Same  Procedure: Robotic assisted laparoscopic right colectomy.   Anesthesia: GETA   Surgeon: Henriette Pierre   Wound Classification: clean contaminated   Specimen: Right colon   Complications: None   Estimated Blood Loss: 80 mL   Indications: Please see H&P for further details.     FIndings: 1.  hepatic flexure colon mass with tattoo 2.  Adequate hemostasis.  4.  No gross metastasis noted  Operation performed with curative intent:Yes  Tumor Location:Hepatic flexure  Extent of colon and vascular resection:Right hemicolectomy - ileocolic, right colic present   Description of procedure: The patient was placed on the operating table in the supine position, lef arm tucked. General anesthesia was induced. A time-out was completed verifying correct patient, procedure, site, positioning, and implant(s) and/or special equipment prior to beginning this procedure. The abdomen was prepped and draped in the usual sterile fashion.    Palmer's point located and Veress needle was inserted.  After confirming 2 clicks and a positive saline drop test, gas insufflation was initiated until the abdominal pressure was measured at 15 mmHg.  Afterwards, the Veress needle was removed and a 8 mm port was placed through the same site using Optiview technique after extending the incision with an 11 blade.  After local was infused, 3 additional incision was made 8 cm apart along the left side of the abdominal wall from the initial incision and two 8mm and one 12mm port placed under direct visualization.  No injuries from trocar placements were noted. The table was placed in the reverse Trendelenburg position with the right side elevated.  Xi robotic platform was then brought to the operative field and docked at an angle from the left lower quadrant.  Tip up grasper and hook cautery was placed in right arm ports.  Fenestrated bipolar in left arm  port.   Examination of the abdominal cavity noted no signs of gross metastasis.  Dissection was started by removing the lateral attachments of the right colon along the white line of Toldt, ensuring the right ureter was not involved.  This was carried around the hepatic flexure to the mid portion of the transverse colon.  Omentum was transected from the transverse colon to the same point.  Afterwards, the right colon was grasped and elevated to visualize mesentary. Point was chosen on the transverse colon for staple transection, proximal to the visualized middle colic vessels.   60mm blue load stapler was then used to transect the colon at this point.  Vessel sealer was then used to transect the right colon mesentery towards a previously determined point on the terminal ileum, care taken to ensure as much mesentery was taken for lymph node evaluation, and also visualizing the duodenum and placing it away from area of transection during this portion.  Once the terminal ileum was reached, 60mm blue load stapler was used to transect the terminal ileum.  ICG was then infused and the staple lines were confirmed to have adequate blood flow.   The transected specimen was placed atop the liver.  The terminal ileum was then brought towards the transverse colon and a side-to-side anastomosis was created with the small bowel laying not twisted.  Enterotomies were made in the small bowel and the transverse colon 6 cm from the staple lines, after placing stay suture using 3-0 vicryl. 60 mm blue load stapler placed through these enterotomies and new anastomosis created. Moderate amount of Sirus stool noted in colon despite prep  completed day prior and a clear liquid diet, and infection present in the abdomen due to the Benjaman stool spillage during anastomosis creation.  The enterotomy was then closed with running 3-0 STRATAFIX in a 2 layer fashion.    No bleeding or additional pathology was noted. JP drain placed in LLQ  port over the anastomosis site, secured to skin using 3-0 nylon. Robot was then undocked, and the remaining port sites were removed, the abdomen was allowed to collapse.  The 12 mm port site fascia extended, wound protector placed, and specimen was able to be removed out of the abdomen completely.   Clean closure protocol initiated and extraction site closed using 1 PDS x 2.  All skin incisions then closed with staples.  Wounds then dressed with honey comb dressing.   The patient tolerated the procedure well, awakened from anesthesia and was taken to the postanesthesia care unit in satisfactory condition.  Foley still in place.  Sponge count and instrument count correct at the end of the procedure.

## 2023-09-14 NOTE — Progress Notes (Signed)
 PROGRESS NOTE    Erik Martinez  FMW:[id number] DOB: 05-23-1955 DOA: 09/13/2023 PCP: [name], MD    Assessment & Plan:   Principal Problem:   Hypokalemia Active Problems:   Colon cancer (HCC)  Assessment and Plan:  Hypokalemia: potassium given. Trending up   Colon cancer: likely etiology of abd pain. S/p robotic assisted lap colectomy. Started on IV rocephin , flagyl  for stool leakage during surg as per gen surg    AKI: Cr is trending down from day prior. 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 S/p 1 unit of pRBCs transfused. Will continue to monitor H&H   Hyponatremia: likely secondary to bowel prep for above surg. Trending up today    Chronic diastolic CHF: will hold home dose of lasix , metolazone  as Continue on home dose of aldactone . Monitor I/Os   HLD: continue on statin    DM2: fair control, HbA1c 7.2 in July 2025. Continue on SSI w/ accuchecks   Peripheral neuropathy: continue on home dose of pregabalin          DVT prophylaxis: lovenox  Code Status: full  Family Communication:  Disposition Plan: as per primary team  Level of care: Med-Surg  Status is: Inpatient Remains inpatient appropriate because: severity of illness    Consultants:  Hospitalist service  Procedures:   Antimicrobials: rocephin , flagyl    Subjective: Pt is lethargic  Objective: Vitals:   09/13/23 2110 09/14/23 0352 09/14/23 0809 09/14/23 0850  BP: (!) 102/55 (!) 106/57 (!) 117/59 113/62  Pulse: 83 80 83 78  Resp: 16 16 17 17   Temp: 97.9 F (36.6 C) 98.1 F (36.7 C) 97.9 F (36.6 C) 98 F (36.7 C)  TempSrc:   Temporal Temporal  SpO2: 100% 97% 96% 98%  Weight:   77.1 kg   Height:   5' 9 (1.753 m)     Intake/Output Summary (Last 24 hours) at 09/14/2023 1003 Last data filed at 09/14/2023 0400 Gross per 24 hour  Intake 1219.77 ml  Output --  Net 1219.77 ml   Filed Weights   09/13/23 0956 09/14/23 0809  Weight: 76.2 kg  77.1 kg    Examination:  General exam: Appears lethargic Respiratory system: Clear to auscultation. Respiratory effort normal. Cardiovascular system: S1 & S2+. No rubs, gallops or clicks.  Gastrointestinal system: Abdomen is nondistended, soft and tenderness to palpation. Hypoactive bowel sounds heard. Central nervous system: did not assess as pt was in PACU Psychiatry: flat mood and affect    Data Reviewed: I have personally reviewed following labs and imaging studies  CBC: Recent Labs  Lab 09/08/23 1216 09/13/23 1019 09/14/23 0403  WBC 5.5  --  5.0  HGB 8.8* 9.5* 7.5*  HCT 28.5* 28.0* 25.5*  MCV 65.5*  --  68.0*  PLT 397  --  219   Basic Metabolic Panel: Recent Labs  Lab 09/13/23 1019 09/13/23 1037 09/13/23 1859 09/13/23 2130 09/14/23 0403  NA 129*  --   --   --  132*  K 2.5* 2.6* 3.0* 3.4* 3.4*  CL 86*  --   --   --  93*  CO2  --   --   --   --  28  GLUCOSE 211*  --   --   --  112*  BUN 25*  --   --   --  19  CREATININE 1.40*  --   --   --  1.07  CALCIUM   --   --   --   --  8.0*  MG  --  2.3  --   --   --    GFR: Estimated Creatinine Clearance: 67.9 mL/min (by C-G formula based on SCr of 1.07 mg/dL). Liver Function Tests: Recent Labs  Lab 09/14/23 0403  AST 18  ALT 13  ALKPHOS 63  BILITOT 0.8  PROT 6.2*  ALBUMIN 2.6*   No results for input(s): LIPASE, AMYLASE in the last 168 hours. No results for input(s): AMMONIA in the last 168 hours. Coagulation Profile: No results for input(s): INR, PROTIME in the last 168 hours. Cardiac Enzymes: No results for input(s): CKTOTAL, CKMB, CKMBINDEX, TROPONINI in the last 168 hours. BNP (last 3 results) Recent Labs    08/21/23 1016  PROBNP 1,787.0*   HbA1C: No results for input(s): HGBA1C in the last 72 hours. CBG: Recent Labs  Lab 09/13/23 1727 09/14/23 0002 09/14/23 0813  GLUCAP 181* 130* 118*   Lipid Profile: No results for input(s): CHOL, HDL, LDLCALC, TRIG,  CHOLHDL, LDLDIRECT in the last 72 hours. Thyroid  Function Tests: No results for input(s): TSH, T4TOTAL, FREET4, T3FREE, THYROIDAB in the last 72 hours. Anemia Panel: No results for input(s): VITAMINB12, FOLATE, FERRITIN, TIBC, IRON , RETICCTPCT in the last 72 hours. Sepsis Labs: No results for input(s): PROCALCITON, LATICACIDVEN in the last 168 hours.  No results found for this or any previous visit (from the past 240 hours).       Radiology Studies: No results found.      Scheduled Meds:  [MAR Hold] sodium chloride    Intravenous Once   [MAR Hold] atorvastatin   20 mg Oral QHS   [MAR Hold] cyclobenzaprine   10 mg Oral TID   [MAR Hold] ferrous gluconate   324 mg Oral BID WC   [MAR Hold] insulin  aspart  0-15 Units Subcutaneous TID WC   [MAR Hold] pantoprazole   40 mg Oral Daily   [MAR Hold] pregabalin   200 mg Oral TID   [MAR Hold] sodium chloride  flush  3 mL Intravenous Q12H   [MAR Hold] spironolactone   25 mg Oral Daily   [MAR Hold] terbinafine   250 mg Oral Daily   [MAR Hold] zolpidem   10 mg Oral QHS   Continuous Infusions:  sodium chloride  50 mL/hr at 09/13/23 2257   calcium  gluconate       LOS: 1 day        Anthony CHRISTELLA Pouch, MD [facility] Pager [phone]  If 7PM-7AM, please contact night-coverage [email or web address] 09/14/2023, 10:03 AM

## 2023-09-15 DIAGNOSIS — C189 Malignant neoplasm of colon, unspecified: Secondary | ICD-10-CM | POA: Diagnosis not present

## 2023-09-15 LAB — BASIC METABOLIC PANEL WITH GFR
Anion gap: 9 (ref 5–15)
BUN: 15 mg/dL (ref 8–23)
CO2: 26 mmol/L (ref 22–32)
Calcium: 7.6 mg/dL — ABNORMAL LOW (ref 8.9–10.3)
Chloride: 98 mmol/L (ref 98–111)
Creatinine, Ser: 1.06 mg/dL (ref 0.61–1.24)
GFR, Estimated: >60 mL/min (ref >60)
Glucose, Bld: 123 mg/dL — ABNORMAL HIGH (ref 70–99)
Potassium: 3.2 mmol/L — ABNORMAL LOW (ref 3.5–5.1)
Sodium: 133 mmol/L — ABNORMAL LOW (ref 135–145)

## 2023-09-15 LAB — TYPE AND SCREEN
ABO/RH(D): A POS
Antibody Screen: NEGATIVE
Unit division: 0

## 2023-09-15 LAB — GLUCOSE, CAPILLARY
Glucose-Capillary: 124 mg/dL — ABNORMAL HIGH (ref 70–99)
Glucose-Capillary: 128 mg/dL — ABNORMAL HIGH (ref 70–99)
Glucose-Capillary: 193 mg/dL — ABNORMAL HIGH (ref 70–99)
Glucose-Capillary: 118 mg/dL — ABNORMAL HIGH (ref 70–99)

## 2023-09-15 LAB — CBC
HCT: 30 % — ABNORMAL LOW (ref 39.0–52.0)
Hemoglobin: 9 g/dL — ABNORMAL LOW (ref 13.0–17.0)
MCH: 20.6 pg — ABNORMAL LOW (ref 26.0–34.0)
MCHC: 30 g/dL (ref 30.0–36.0)
MCV: 68.8 fL — ABNORMAL LOW (ref 80.0–100.0)
Platelets: 240 K/uL (ref 150–400)
RBC: 4.36 MIL/uL (ref 4.22–5.81)
RDW: 21.3 % — ABNORMAL HIGH (ref 11.5–15.5)
WBC: 9.8 K/uL (ref 4.0–10.5)
nRBC: 0 % (ref 0.0–0.2)

## 2023-09-15 LAB — BPAM RBC
Blood Product Expiration Date: 202509112359
ISSUE DATE / TIME: 202509040823
Unit Type and Rh: 6200

## 2023-09-15 MED ORDER — POTASSIUM CHLORIDE 10 MEQ/100ML IV SOLN: 10.0000 meq | INTRAVENOUS | Status: DC

## 2023-09-15 MED ORDER — CHLORHEXIDINE GLUCONATE CLOTH 2 % EX PADS: 6.0000 | MEDICATED_PAD | Freq: Every day | CUTANEOUS | Status: DC

## 2023-09-15 MED ORDER — POTASSIUM CHLORIDE CRYS ER 20 MEQ PO TBCR
40.0000 meq | EXTENDED_RELEASE_TABLET | Freq: Two times a day (BID) | ORAL | Status: AC
Start: 2023-09-15 — End: 2023-09-15
  Administered 2023-09-15 (×2): 40 meq via ORAL
  Filled 2023-09-15 (×2): qty 2

## 2023-09-15 NOTE — Progress Notes (Signed)
 [facility]- General Surgery  SURGICAL PROGRESS NOTE  Hospital Day(s): 2.   Post op day(s): 1 Day Post-Op.   Interval History:  Patient overall doing well. Abdominal pain improving. No sign of GI function. He's anxious to go home but explained the importance of waiting for him to have GI function and tolerate regular food before going home.   Vital signs in last 24 hours: [min-max] current  Temp:  [97.1 F (36.2 C)-98.7 F (37.1 C)] 98.2 F (36.8 C) (09/05 0723) Pulse Rate:  [82-98] 95 (09/05 0723) Resp:  [0-21] 18 (09/05 0723) BP: (117-134)/(55-68) 121/55 (09/05 0723) SpO2:  [96 %-100 %] 96 % (09/05 0723)     Height: 5' 9 (175.3 cm) Weight: 77.1 kg BMI (Calculated): 25.09   Intake/Output last 2 shifts:  09/04 0701 - 09/05 0700 In: 2300.7 [I.V.:1491.8; Blood:355; IV Piggyback:453.8] Out: 1770 [Urine:1600; Drains:170]   Physical Exam:  Constitutional: alert, cooperative and no distress  Respiratory: breathing non-labored at rest  Cardiovascular: regular rate and sinus rhythm  Gastrointestinal: soft, tender, mildly distended, honeycomb dressing intact, incisions appear clean and dry   Labs:     Latest Ref Rng & Units 09/15/2023    4:44 AM 09/14/2023    4:03 AM 09/13/2023   10:19 AM  CBC  WBC 4.0 - 10.5 K/uL 9.8  5.0    Hemoglobin 13.0 - 17.0 g/dL 9.0  7.5  9.5   Hematocrit 39.0 - 52.0 % 30.0  25.5  28.0   Platelets 150 - 400 K/uL 240  219        Latest Ref Rng & Units 09/15/2023    4:44 AM 09/14/2023    4:03 AM 09/13/2023    9:30 PM  CMP  Glucose 70 - 99 mg/dL 876  887    BUN 8 - 23 mg/dL 15  19    Creatinine 9.38 - 1.24 mg/dL 8.93  8.92    Sodium 864 - 145 mmol/L 133  132    Potassium 3.5 - 5.1 mmol/L 3.2  3.4  3.4   Chloride 98 - 111 mmol/L 98  93    CO2 22 - 32 mmol/L 26  28    Calcium  8.9 - 10.3 mg/dL 7.6  8.0    Total Protein 6.5 - 8.1 g/dL  6.2    Total Bilirubin 0.0 - 1.2 mg/dL  0.8    Alkaline Phos 38 - 126 U/L  63    AST 15 - 41 U/L  18    ALT 0 - 44 U/L   13      Imaging studies: No new pertinent imaging studies   Assessment/Plan:  68 y.o. male with right colon cancer 1 Day Post-Op s/p robotic assisted laparoscopic right colectomy, complicated by pertinent comorbidities including HFpEF, type 2 diabetes mellitus,hepatitis B/C s/p treatment, hyperlipidemia, PAD, chronic pain syndrome, and GERD.    - Stable vital signs, no leukocytosis and stable Hbg/Hct   - Continue NPO until patient has return of GI function, allow gum/hard candy   - Remove foley catheter    - Continue IV rocephin /flagyl    - Encourage to ambulate   - Continue IV fluids and pain management  - Continue DVT prophylaxis with Lovenox     -- Lagina Reader Barrientos PA-C

## 2023-09-15 NOTE — TOC CM/SW Note (Signed)
**Note Erik-Identified via Obfuscation**  Transition of Care [facility]) - Inpatient Brief Assessment   Patient Details  Name: DEMARRION Martinez MRN: [id number] Date of Birth: 12/20/1955  Transition of Care [facility]) CM/SW Contact:    Corean ONEIDA Haddock, RN Phone Number: 09/15/2023, 12:02 PM   Clinical Narrative:  Transition of Care [facility]) Screening Note   Patient Details  Name: Erik Martinez Date of Birth: December 09, 1955   Transition of Care [facility]) CM/SW Contact:    Corean ONEIDA Haddock, RN Phone Number: 09/15/2023, 12:02 PM    Transition of Care Department [facility]) has reviewed patient and no TOC needs have been identified at this time.  If new patient transition needs arise, please place a TOC consult.     Transition of Care Asessment: Insurance and Status: Insurance coverage has been reviewed Patient has primary care physician: Yes     Prior/Current Home Services: No current home services Social Drivers of Health Review: SDOH reviewed no interventions necessary Readmission risk has been reviewed: Yes Transition of care needs: no transition of care needs at this time

## 2023-09-15 NOTE — Care Management Important Message (Signed)
 Important Message  Patient Details  Name: Erik Martinez MRN: [id number] Date of Birth: [birth year]   Important Message Given:  Yes - Medicare IM     [name] 09/15/2023, 11:37 AM

## 2023-09-15 NOTE — Plan of Care (Signed)
   Problem: Education: Goal: Knowledge of General Education information will improve Description Including pain rating scale, medication(s)/side effects and non-pharmacologic comfort measures Outcome: Progressing

## 2023-09-15 NOTE — Progress Notes (Signed)
 PROGRESS NOTE    Erik Martinez  FMW:[id number] DOB: 24-Jul-1955 DOA: 09/13/2023 PCP: [name], MD    Assessment & Plan:   Principal Problem:   Hypokalemia Active Problems:   Colon cancer (HCC)  Assessment and Plan:  Hypokalemia: KCl given. Mg is WNL   Colon cancer: likely etiology of abd pain. NPO as per gen surg. S/p robotic assisted lap colectomy. No gas or BM yet. Continue on IV rocephin , flagyl  for stool leakage during surg as per gen surg. Increased norco dose & increased frequency of dilaudid  prn for significant abd pain   AKI: Cr is trending down again slightly today. Avoid nephrotoxic meds   IDA: likely secondary to colon cancer H&H are labile. S/p 1 unit of pRBCs transfused. H&H are trending up   Hyponatremia: likely secondary to bowel prep for above surg. Trending up daily    Chronic diastolic CHF: holding home dose of lasix , metolazone . Monitor I/Os. Continue on home dose of aldactone    HLD: continue on statin    DM2: fair control, HbA1c 7.2 in July 2025. Continue on SSI w/ accuchecks    Peripheral neuropathy: continue on home dose of pregabalin          DVT prophylaxis: lovenox  Code Status: full  Family Communication:  Disposition Plan: as per primary team  Level of care: Med-Surg  Status is: Inpatient Remains inpatient appropriate because: severity of illness    Consultants:  Hospitalist service  Procedures:   Antimicrobials: rocephin , flagyl    Subjective: Pt c/o abd pain & being hungry   Objective: Vitals:   09/14/23 1435 09/14/23 2001 09/15/23 0346 09/15/23 0723  BP: 120/67 134/66 (!) 117/55 (!) 121/55  Pulse: 82 96 98 95  Resp:  18 18 18   Temp: 97.9 F (36.6 C) 98.4 F (36.9 C) 98.7 F (37.1 C) 98.2 F (36.8 C)  TempSrc: Oral Oral Oral   SpO2: 100% 100% 97% 96%  Weight:      Height:        Intake/Output Summary (Last 24 hours) at 09/15/2023 0931 Last data filed at 09/15/2023 0904 Gross per 24 hour  Intake 2310.65 ml   Output 1770 ml  Net 540.65 ml   Filed Weights   09/13/23 0956 09/14/23 0809  Weight: 76.2 kg 77.1 kg    Examination:  General exam: appears calm but uncomfortable Respiratory system: clear breath sounds b/l  Cardiovascular system: S1/S2+. No rubs or clicks Gastrointestinal system: abd is soft, distended, tender to palpation, hypoactive bowel sounds. Central nervous system: alert & oriented. Moves all extremities  Psychiatry: judgement and insight appears normal. Flat mood and affect     Data Reviewed: I have personally reviewed following labs and imaging studies  CBC: Recent Labs  Lab 09/08/23 1216 09/13/23 1019 09/14/23 0403 09/15/23 0444  WBC 5.5  --  5.0 9.8  HGB 8.8* 9.5* 7.5* 9.0*  HCT 28.5* 28.0* 25.5* 30.0*  MCV 65.5*  --  68.0* 68.8*  PLT 397  --  219 240   Basic Metabolic Panel: Recent Labs  Lab 09/13/23 1019 09/13/23 1037 09/13/23 1859 09/13/23 2130 09/14/23 0403 09/15/23 0444  NA 129*  --   --   --  132* 133*  K 2.5* 2.6* 3.0* 3.4* 3.4* 3.2*  CL 86*  --   --   --  93* 98  CO2  --   --   --   --  28 26  GLUCOSE 211*  --   --   --  112*  123*  BUN 25*  --   --   --  19 15  CREATININE 1.40*  --   --   --  1.07 1.06  CALCIUM   --   --   --   --  8.0* 7.6*  MG  --  2.3  --   --   --   --    GFR: Estimated Creatinine Clearance: 68.6 mL/min (by C-G formula based on SCr of 1.06 mg/dL). Liver Function Tests: Recent Labs  Lab 09/14/23 0403  AST 18  ALT 13  ALKPHOS 63  BILITOT 0.8  PROT 6.2*  ALBUMIN 2.6*   No results for input(s): LIPASE, AMYLASE in the last 168 hours. No results for input(s): AMMONIA in the last 168 hours. Coagulation Profile: No results for input(s): INR, PROTIME in the last 168 hours. Cardiac Enzymes: No results for input(s): CKTOTAL, CKMB, CKMBINDEX, TROPONINI in the last 168 hours. BNP (last 3 results) Recent Labs    08/21/23 1016  PROBNP 1,787.0*   HbA1C: No results for input(s): HGBA1C in the  last 72 hours. CBG: Recent Labs  Lab 09/14/23 0813 09/14/23 1257 09/14/23 1644 09/14/23 2128 09/15/23 0722  GLUCAP 118* 190* 167* 119* 124*   Lipid Profile: No results for input(s): CHOL, HDL, LDLCALC, TRIG, CHOLHDL, LDLDIRECT in the last 72 hours. Thyroid  Function Tests: No results for input(s): TSH, T4TOTAL, FREET4, T3FREE, THYROIDAB in the last 72 hours. Anemia Panel: No results for input(s): VITAMINB12, FOLATE, FERRITIN, TIBC, IRON , RETICCTPCT in the last 72 hours. Sepsis Labs: No results for input(s): PROCALCITON, LATICACIDVEN in the last 168 hours.  No results found for this or any previous visit (from the past 240 hours).       Radiology Studies: No results found.      Scheduled Meds:  sodium chloride    Intravenous Once   atorvastatin   20 mg Oral QHS   Chlorhexidine  Gluconate Cloth  6 each Topical Daily   cyclobenzaprine   10 mg Oral TID   enoxaparin  (LOVENOX ) injection  40 mg Subcutaneous Q24H   ferrous gluconate   324 mg Oral BID WC   insulin  aspart  0-15 Units Subcutaneous TID WC   pantoprazole   40 mg Oral Daily   pregabalin   200 mg Oral TID   sodium chloride  flush  3 mL Intravenous Q12H   spironolactone   25 mg Oral Daily   terbinafine   250 mg Oral Daily   zolpidem   10 mg Oral QHS   Continuous Infusions:  cefTRIAXone  (ROCEPHIN )  IV 2 g (09/15/23 0902)   And   metronidazole  500 mg (09/15/23 0903)   potassium chloride        LOS: 2 days        Anthony CHRISTELLA Pouch, MD [facility] Pager [phone]  If 7PM-7AM, please contact night-coverage [email or web address] 09/15/2023, 9:31 AM

## 2023-09-15 NOTE — Plan of Care (Signed)
  Problem: Education: Goal: Knowledge of General Education information will improve Description: Including pain rating scale, medication(s)/side effects and non-pharmacologic comfort measures Outcome: Progressing   Problem: Health Behavior/Discharge Planning: Goal: Ability to manage health-related needs will improve Outcome: Progressing   Problem: Clinical Measurements: Goal: Ability to maintain clinical measurements within normal limits will improve Outcome: Progressing Goal: Will remain free from infection Outcome: Progressing Goal: Diagnostic test results will improve Outcome: Progressing Goal: Respiratory complications will improve Outcome: Progressing Goal: Cardiovascular complication will be avoided Outcome: Progressing   Problem: Activity: Goal: Risk for activity intolerance will decrease Outcome: Progressing   Problem: Nutrition: Goal: Adequate nutrition will be maintained Outcome: Progressing   Problem: Coping: Goal: Level of anxiety will decrease Outcome: Progressing   Problem: Elimination: Goal: Will not experience complications related to urinary retention Outcome: Progressing   Problem: Pain Managment: Goal: General experience of comfort will improve and/or be controlled Outcome: Progressing   Problem: Safety: Goal: Ability to remain free from injury will improve Outcome: Progressing   Problem: Skin Integrity: Goal: Risk for impaired skin integrity will decrease Outcome: Progressing   Problem: Education: Goal: Ability to describe self-care measures that may prevent or decrease complications (Diabetes Survival Skills Education) will improve Outcome: Progressing Goal: Individualized Educational Video(s) Outcome: Progressing   Problem: Coping: Goal: Ability to adjust to condition or change in health will improve Outcome: Progressing   Problem: Fluid Volume: Goal: Ability to maintain a balanced intake and output will improve Outcome: Progressing    Problem: Health Behavior/Discharge Planning: Goal: Ability to identify and utilize available resources and services will improve Outcome: Progressing Goal: Ability to manage health-related needs will improve Outcome: Progressing   Problem: Metabolic: Goal: Ability to maintain appropriate glucose levels will improve Outcome: Progressing   Problem: Nutritional: Goal: Maintenance of adequate nutrition will improve Outcome: Progressing Goal: Progress toward achieving an optimal weight will improve Outcome: Progressing   Problem: Skin Integrity: Goal: Risk for impaired skin integrity will decrease Outcome: Progressing   Problem: Tissue Perfusion: Goal: Adequacy of tissue perfusion will improve Outcome: Progressing   Problem: Elimination: Goal: Will not experience complications related to bowel motility Outcome: Not Progressing  Needs to have BM or pass flatus to get diet order per MD.

## 2023-09-15 NOTE — Plan of Care (Signed)
  Problem: Education: Goal: Knowledge of General Education information will improve Description: Including pain rating scale, medication(s)/side effects and non-pharmacologic comfort measures Outcome: Progressing   Problem: Activity: Goal: Risk for activity intolerance will decrease Outcome: Progressing   Problem: Elimination: Goal: Will not experience complications related to bowel motility Outcome: Progressing Goal: Will not experience complications related to urinary retention Outcome: Progressing   Problem: Pain Managment: Goal: General experience of comfort will improve and/or be controlled Outcome: Progressing

## 2023-09-16 DIAGNOSIS — C189 Malignant neoplasm of colon, unspecified: Secondary | ICD-10-CM | POA: Diagnosis not present

## 2023-09-16 LAB — CBC
HCT: 29.5 % — ABNORMAL LOW (ref 39.0–52.0)
Hemoglobin: 8.6 g/dL — ABNORMAL LOW (ref 13.0–17.0)
MCH: 20.4 pg — ABNORMAL LOW (ref 26.0–34.0)
MCHC: 29.2 g/dL — ABNORMAL LOW (ref 30.0–36.0)
MCV: 70.1 fL — ABNORMAL LOW (ref 80.0–100.0)
Platelets: 243 K/uL (ref 150–400)
RBC: 4.21 MIL/uL — ABNORMAL LOW (ref 4.22–5.81)
RDW: 22 % — ABNORMAL HIGH (ref 11.5–15.5)
WBC: 8.3 K/uL (ref 4.0–10.5)
nRBC: 0 % (ref 0.0–0.2)

## 2023-09-16 LAB — BASIC METABOLIC PANEL WITH GFR
Anion gap: 6 (ref 5–15)
BUN: 15 mg/dL (ref 8–23)
CO2: 28 mmol/L (ref 22–32)
Calcium: 8 mg/dL — ABNORMAL LOW (ref 8.9–10.3)
Chloride: 100 mmol/L (ref 98–111)
Creatinine, Ser: 1.04 mg/dL (ref 0.61–1.24)
GFR, Estimated: >60 mL/min (ref >60)
Glucose, Bld: 105 mg/dL — ABNORMAL HIGH (ref 70–99)
Potassium: 3.9 mmol/L (ref 3.5–5.1)
Sodium: 134 mmol/L — ABNORMAL LOW (ref 135–145)

## 2023-09-16 LAB — GLUCOSE, CAPILLARY
Glucose-Capillary: 134 mg/dL — ABNORMAL HIGH (ref 70–99)
Glucose-Capillary: 124 mg/dL — ABNORMAL HIGH (ref 70–99)
Glucose-Capillary: 136 mg/dL — ABNORMAL HIGH (ref 70–99)
Glucose-Capillary: 135 mg/dL — ABNORMAL HIGH (ref 70–99)

## 2023-09-16 LAB — MAGNESIUM: Magnesium: 2.5 mg/dL — ABNORMAL HIGH (ref 1.7–2.4)

## 2023-09-16 NOTE — Progress Notes (Signed)
 PROGRESS NOTE    Erik Martinez  FMW:[id number] DOB: 1955/07/19 DOA: 09/13/2023 PCP: [name], MD    Assessment & Plan:   Principal Problem:   Hypokalemia Active Problems:   Colon cancer (HCC)  Assessment and Plan:  Hypokalemia: WNL today    Colon cancer: likely etiology of abd pain. NPO as per gen surg. S/p robotic assisted lap colectomy. Still no gas or BM yet. Continue on IV rocephin , flagyl  for stool leakage during surg as per gen surg. Norco, dilaudid  prn for pain    AKI: Cr is trending down from day prior. Avoid nephrotoxic meds   IDA: likely secondary to colon cancer H&H are labile. S/p 1 unit of pRBCs transfused. H&H are labile. Holding po iron  in case this is contributing to constipation/return of bowel function  Hyponatremia: likely secondary to bowel prep for above surg. Almost WNL    Chronic diastolic CHF: holding home dose of lasix , metolazone . Monitor I/Os. Continue on home dose of aldactone     HLD: continue on statin    DM2: fair control, HbA1c 7.2 in July 2025. Continue on SSI w/ accuchecks    Peripheral neuropathy: continue on home dose of pregabalin          DVT prophylaxis: lovenox  Code Status: full  Family Communication:  Disposition Plan: as per primary team  Level of care: Med-Surg  Status is: Inpatient Remains inpatient appropriate because: severity of illness    Consultants:  Hospitalist service  Procedures:   Antimicrobials: rocephin , flagyl    Subjective: Pt c/o being hungry   Objective: Vitals:   09/15/23 2022 09/16/23 0100 09/16/23 0332 09/16/23 0904  BP: 115/66 115/69 113/64 127/68  Pulse: 96 88 88 93  Resp: 16 16 16 16   Temp: 98.6 F (37 C) 98.7 F (37.1 C) 98.4 F (36.9 C)   TempSrc: Oral Oral Oral   SpO2: 100% 99% 98% 98%  Weight:      Height:        Intake/Output Summary (Last 24 hours) at 09/16/2023 0938 Last data filed at 09/16/2023 0117 Gross per 24 hour  Intake 663.16 ml  Output 890 ml  Net -226.84  ml   Filed Weights   09/13/23 0956 09/14/23 0809  Weight: 76.2 kg 77.1 kg    Examination:  General exam: appears comfortable Respiratory system: clear breath sounds b/l  Cardiovascular system: S1/S2+. No rubs or clicks Gastrointestinal system: abd is soft, NT, hypoactive bowel sounds Central nervous system: alert & oriented. Moves all extremities  Psychiatry: judgement and insight appears normal. Flat mood and affect    Data Reviewed: I have personally reviewed following labs and imaging studies  CBC: Recent Labs  Lab 09/13/23 1019 09/14/23 0403 09/15/23 0444 09/16/23 0443  WBC  --  5.0 9.8 8.3  HGB 9.5* 7.5* 9.0* 8.6*  HCT 28.0* 25.5* 30.0* 29.5*  MCV  --  68.0* 68.8* 70.1*  PLT  --  219 240 243   Basic Metabolic Panel: Recent Labs  Lab 09/13/23 1019 09/13/23 1037 09/13/23 1859 09/13/23 2130 09/14/23 0403 09/15/23 0444 09/16/23 0443  NA 129*  --   --   --  132* 133* 134*  K 2.5* 2.6* 3.0* 3.4* 3.4* 3.2* 3.9  CL 86*  --   --   --  93* 98 100  CO2  --   --   --   --  28 26 28   GLUCOSE 211*  --   --   --  112* 123* 105*  BUN 25*  --   --   --  19 15 15   CREATININE 1.40*  --   --   --  1.07 1.06 1.04  CALCIUM   --   --   --   --  8.0* 7.6* 8.0*  MG  --  2.3  --   --   --   --  2.5*   GFR: Estimated Creatinine Clearance: 69.9 mL/min (by C-G formula based on SCr of 1.04 mg/dL). Liver Function Tests: Recent Labs  Lab 09/14/23 0403  AST 18  ALT 13  ALKPHOS 63  BILITOT 0.8  PROT 6.2*  ALBUMIN 2.6*   No results for input(s): LIPASE, AMYLASE in the last 168 hours. No results for input(s): AMMONIA in the last 168 hours. Coagulation Profile: No results for input(s): INR, PROTIME in the last 168 hours. Cardiac Enzymes: No results for input(s): CKTOTAL, CKMB, CKMBINDEX, TROPONINI in the last 168 hours. BNP (last 3 results) Recent Labs    08/21/23 1016  PROBNP 1,787.0*   HbA1C: No results for input(s): HGBA1C in the last 72  hours. CBG: Recent Labs  Lab 09/15/23 0722 09/15/23 1126 09/15/23 1804 09/15/23 2112 09/16/23 0839  GLUCAP 124* 128* 193* 118* 134*   Lipid Profile: No results for input(s): CHOL, HDL, LDLCALC, TRIG, CHOLHDL, LDLDIRECT in the last 72 hours. Thyroid  Function Tests: No results for input(s): TSH, T4TOTAL, FREET4, T3FREE, THYROIDAB in the last 72 hours. Anemia Panel: No results for input(s): VITAMINB12, FOLATE, FERRITIN, TIBC, IRON , RETICCTPCT in the last 72 hours. Sepsis Labs: No results for input(s): PROCALCITON, LATICACIDVEN in the last 168 hours.  No results found for this or any previous visit (from the past 240 hours).       Radiology Studies: No results found.      Scheduled Meds:  sodium chloride    Intravenous Once   atorvastatin   20 mg Oral QHS   cyclobenzaprine   10 mg Oral TID   enoxaparin  (LOVENOX ) injection  40 mg Subcutaneous Q24H   ferrous gluconate   324 mg Oral BID WC   insulin  aspart  0-15 Units Subcutaneous TID WC   pantoprazole   40 mg Oral Daily   pregabalin   200 mg Oral TID   sodium chloride  flush  3 mL Intravenous Q12H   spironolactone   25 mg Oral Daily   terbinafine   250 mg Oral Daily   zolpidem   10 mg Oral QHS   Continuous Infusions:  cefTRIAXone  (ROCEPHIN )  IV 2 g (09/16/23 0845)   And   metronidazole  500 mg (09/16/23 0843)     LOS: 3 days        Anthony CHRISTELLA Pouch, MD [facility] Pager [phone]  If 7PM-7AM, please contact night-coverage [email or web address] 09/16/2023, 9:38 AM

## 2023-09-16 NOTE — Progress Notes (Signed)
 [facility]- General Surgery  SURGICAL PROGRESS NOTE  Hospital Day(s): 3.   Post op day(s): 2 Days Post-Op.   Interval History:  Patient overall doing well. Abdominal pain improving. No sign of GI function. He's anxious to eat, reporting an appetite, but again reviewed the importance of waiting for him to have GI function and tolerate regular food before going home.  He is content to wait another day.  Vital signs in last 24 hours: [min-max] current  Temp:  [97.9 F (36.6 C)-98.7 F (37.1 C)] 98.4 F (36.9 C) (09/06 0332) Pulse Rate:  [88-98] 88 (09/06 0332) Resp:  [16-18] 16 (09/06 0332) BP: (113-127)/(64-69) 113/64 (09/06 0332) SpO2:  [98 %-100 %] 98 % (09/06 0332)     Height: 5' 9 (175.3 cm) Weight: 77.1 kg BMI (Calculated): 25.09   Intake/Output last 2 shifts:  09/05 0701 - 09/06 0700 In: 673.2 [P.O.:360; I.V.:10; IV Piggyback:303.2] Out: 890 [Urine:520; Drains:370]   Physical Exam:  Constitutional: alert, cooperative and no distress  Respiratory: breathing non-labored at rest  Cardiovascular: regular rate and sinus rhythm  Gastrointestinal: soft, mildly distended, honeycomb dressings intact, incisions appear clean and dry, drain primarily serous, but serosanguineous.  Labs:     Latest Ref Rng & Units 09/16/2023    4:43 AM 09/15/2023    4:44 AM 09/14/2023    4:03 AM  CBC  WBC 4.0 - 10.5 K/uL 8.3  9.8  5.0   Hemoglobin 13.0 - 17.0 g/dL 8.6  9.0  7.5   Hematocrit 39.0 - 52.0 % 29.5  30.0  25.5   Platelets 150 - 400 K/uL 243  240  219       Latest Ref Rng & Units 09/16/2023    4:43 AM 09/15/2023    4:44 AM 09/14/2023    4:03 AM  CMP  Glucose 70 - 99 mg/dL 894  876  887   BUN 8 - 23 mg/dL 15  15  19    Creatinine 0.61 - 1.24 mg/dL 8.95  8.93  8.92   Sodium 135 - 145 mmol/L 134  133  132   Potassium 3.5 - 5.1 mmol/L 3.9  3.2  3.4   Chloride 98 - 111 mmol/L 100  98  93   CO2 22 - 32 mmol/L 28  26  28    Calcium  8.9 - 10.3 mg/dL 8.0  7.6  8.0   Total Protein 6.5 - 8.1  g/dL   6.2   Total Bilirubin 0.0 - 1.2 mg/dL   0.8   Alkaline Phos 38 - 126 U/L   63   AST 15 - 41 U/L   18   ALT 0 - 44 U/L   13     Imaging studies: No new pertinent imaging studies   Assessment/Plan:  68 y.o. male with right colon cancer 2 Days Post-Op s/p robotic assisted laparoscopic right colectomy, complicated by pertinent comorbidities including HFpEF, type 2 diabetes mellitus,hepatitis B/C s/p treatment, hyperlipidemia, PAD, chronic pain syndrome, and GERD.    - Stable vital signs, no leukocytosis and stable Hbg/Hct   - Continue NPO until patient has return of GI function, allow gum/hard candy   - Continue IV rocephin /flagyl  enrigue.   - Encourage to ambulate   - Continue IV fluids and pain management  - Continue DVT prophylaxis with Lovenox     -- Honor Leghorn, M.D., [facility]  09/16/2023 ; 8:23 AM

## 2023-09-16 NOTE — Plan of Care (Signed)

## 2023-09-16 NOTE — Plan of Care (Signed)
   Problem: Education: Goal: Knowledge of General Education information will improve Description Including pain rating scale, medication(s)/side effects and non-pharmacologic comfort measures Outcome: Progressing

## 2023-09-17 DIAGNOSIS — C189 Malignant neoplasm of colon, unspecified: Secondary | ICD-10-CM | POA: Diagnosis not present

## 2023-09-17 LAB — GLUCOSE, CAPILLARY
Glucose-Capillary: 163 mg/dL — ABNORMAL HIGH (ref 70–99)
Glucose-Capillary: 120 mg/dL — ABNORMAL HIGH (ref 70–99)
Glucose-Capillary: 84 mg/dL (ref 70–99)
Glucose-Capillary: 114 mg/dL — ABNORMAL HIGH (ref 70–99)

## 2023-09-17 LAB — BASIC METABOLIC PANEL WITH GFR
Anion gap: 9 (ref 5–15)
BUN: 14 mg/dL (ref 8–23)
CO2: 28 mmol/L (ref 22–32)
Calcium: 8.5 mg/dL — ABNORMAL LOW (ref 8.9–10.3)
Chloride: 101 mmol/L (ref 98–111)
Creatinine, Ser: 0.91 mg/dL (ref 0.61–1.24)
GFR, Estimated: >60 mL/min (ref >60)
Glucose, Bld: 114 mg/dL — ABNORMAL HIGH (ref 70–99)
Potassium: 4.2 mmol/L (ref 3.5–5.1)
Sodium: 138 mmol/L (ref 135–145)

## 2023-09-17 LAB — CBC
HCT: 29.6 % — ABNORMAL LOW (ref 39.0–52.0)
Hemoglobin: 8.6 g/dL — ABNORMAL LOW (ref 13.0–17.0)
MCH: 20.5 pg — ABNORMAL LOW (ref 26.0–34.0)
MCHC: 29.1 g/dL — ABNORMAL LOW (ref 30.0–36.0)
MCV: 70.6 fL — ABNORMAL LOW (ref 80.0–100.0)
Platelets: 255 K/uL (ref 150–400)
RBC: 4.19 MIL/uL — ABNORMAL LOW (ref 4.22–5.81)
RDW: 22.6 % — ABNORMAL HIGH (ref 11.5–15.5)
WBC: 7.8 K/uL (ref 4.0–10.5)
nRBC: 0 % (ref 0.0–0.2)

## 2023-09-17 LAB — MAGNESIUM: Magnesium: 2.2 mg/dL (ref 1.7–2.4)

## 2023-09-17 NOTE — Progress Notes (Signed)
 General Surgery  SURGICAL PROGRESS NOTE  Hospital Day(s): 4.   Post op day(s): 3 Days Post-Op.   Interval History:  Patient overall doing ok. Abdominal pain not an issue.  Still no flatus no bowel movement, no sign of GI function, though he is tolerating sips/ice chips. He's anxious to eat, reporting an appetite, but again reviewed the importance of waiting for him to have GI function and tolerate regular food before going home.   Vital signs in last 24 hours: [min-max] current  Temp:  [98.1 F (36.7 C)-98.8 F (37.1 C)] 98.2 F (36.8 C) (09/07 0513) Pulse Rate:  [88-105] 88 (09/07 0513) Resp:  [16-18] 17 (09/07 0513) BP: (102-117)/(58-73) 112/61 (09/07 0513) SpO2:  [94 %-98 %] 98 % (09/07 0513)     Height: 5' 9 (175.3 cm) Weight: 77.1 kg BMI (Calculated): 25.09   Intake/Output last 2 shifts:  09/06 0701 - 09/07 0700 In: 533 [P.O.:240; IV Piggyback:293] Out: 270 [Drains:270]   Physical Exam:  Constitutional: alert, cooperative and no distress  Respiratory: breathing non-labored at rest  Cardiovascular: regular rate and sinus rhythm  Gastrointestinal: soft, mildly distended, honeycomb dressings intact, incisions appear clean and dry, drain primarily serous, but serosanguineous.  Labs:     Latest Ref Rng & Units 09/17/2023    5:09 AM 09/16/2023    4:43 AM 09/15/2023    4:44 AM  CBC  WBC 4.0 - 10.5 K/uL 7.8  8.3  9.8   Hemoglobin 13.0 - 17.0 g/dL 8.6  8.6  9.0   Hematocrit 39.0 - 52.0 % 29.6  29.5  30.0   Platelets 150 - 400 K/uL 255  243  240       Latest Ref Rng & Units 09/16/2023    4:43 AM 09/15/2023    4:44 AM 09/14/2023    4:03 AM  CMP  Glucose 70 - 99 mg/dL 894  876  887   BUN 8 - 23 mg/dL 15  15  19    Creatinine 0.61 - 1.24 mg/dL 8.95  8.93  8.92   Sodium 135 - 145 mmol/L 134  133  132   Potassium 3.5 - 5.1 mmol/L 3.9  3.2  3.4   Chloride 98 - 111 mmol/L 100  98  93   CO2 22 - 32 mmol/L 28  26  28    Calcium  8.9 - 10.3 mg/dL 8.0  7.6  8.0   Total Protein 6.5 - 8.1  g/dL   6.2   Total Bilirubin 0.0 - 1.2 mg/dL   0.8   Alkaline Phos 38 - 126 U/L   63   AST 15 - 41 U/L   18   ALT 0 - 44 U/L   13     Imaging studies: No new pertinent imaging studies   Assessment/Plan:  68 y.o. male with right colon cancer 3 Days Post-Op s/p robotic assisted laparoscopic right colectomy, complicated by pertinent comorbidities including HFpEF, type 2 diabetes mellitus,hepatitis B/C s/p treatment, hyperlipidemia, PAD, chronic pain syndrome, and GERD.    - Stable vital signs, no leukocytosis and stable Hbg/Hct   - Continue NPO until patient has return of GI function, allow gum/hard candy   - Continue IV rocephin /flagyl  enrigue.   - Encourage to ambulate   - Continue IV fluids and pain management  - Continue DVT prophylaxis with Lovenox     -- Honor Leghorn, M.D., [facility]  09/17/2023 ; 11:11 AM

## 2023-09-17 NOTE — Progress Notes (Addendum)
 PROGRESS NOTE    Erik Martinez  FMW:[id number] DOB: [birth year] DOA: 09/13/2023 PCP: [name], MD    Assessment & Plan:   Principal Problem:   Hypokalemia Active Problems:   Colon cancer (HCC)  Assessment and Plan:  Hypokalemia: WNL today     Colon cancer: likely etiology of abd pain. NPO as per gen surg. S/p robotic assisted lap colectomy. No gas or BM yet. Continue on IV flagyl , rocephin  for stool leakage during surg as per gen surg. Norco, dilaudid  prn for pain    AKI: resolved    IDA: likely secondary to colon cancer. S/p 1 unit of pRBCs transfused. H&H are stable. Holding po iron  in case this is contributing to constipation/return of bowel function  Hyponatremia: likely secondary to bowel prep for above surg. Resolved    Chronic diastolic CHF: holding home dose of lasix , metolazone . Continue on home dose of aldactone . Monitor I/Os   HLD: continue on statin    DM2: fair control, HbA1c 7.2 in July 2025. Continue on SSI w/ accuchecks    Peripheral neuropathy: continue on home dose of pregabalin           DVT prophylaxis: lovenox  Code Status: full  Family Communication:  Disposition Plan: as per primary team  Level of care: Med-Surg  Status is: Inpatient Remains inpatient appropriate because: severity of illness    Consultants:  Hospitalist service  Procedures:   Antimicrobials: rocephin , flagyl    Subjective: Pt c/o not being able to eat or drink.   Objective: Vitals:   09/16/23 1501 09/16/23 2005 09/17/23 0046 09/17/23 0513  BP: 117/67 104/67 (!) 102/58 112/61  Pulse: 99 (!) 105 94 88  Resp: 16 18 18 17   Temp: 98.8 F (37.1 C) 98.5 F (36.9 C) 98.6 F (37 C) 98.2 F (36.8 C)  TempSrc:   Oral Oral  SpO2: 98% 94% 95% 98%  Weight:      Height:        Intake/Output Summary (Last 24 hours) at 09/17/2023 0927 Last data filed at 09/17/2023 0500 Gross per 24 hour  Intake 533 ml  Output 190 ml  Net 343 ml   Filed Weights   09/13/23 0956  09/14/23 0809  Weight: 76.2 kg 77.1 kg    Examination:  General exam: appears comfortable Respiratory system: clear breath sounds b/l Cardiovascular system: S1/S2+. No rubs or clicks Gastrointestinal system: abd is soft, NT, ND & hypoactive bowel sounds  Central nervous system: alert & oriented. Moves all extremities  Psychiatry: judgement and insight appears normal. Flat mood and affect     Data Reviewed: I have personally reviewed following labs and imaging studies  CBC: Recent Labs  Lab 09/13/23 1019 09/14/23 0403 09/15/23 0444 09/16/23 0443 09/17/23 0509  WBC  --  5.0 9.8 8.3 7.8  HGB 9.5* 7.5* 9.0* 8.6* 8.6*  HCT 28.0* 25.5* 30.0* 29.5* 29.6*  MCV  --  68.0* 68.8* 70.1* 70.6*  PLT  --  219 240 243 255   Basic Metabolic Panel: Recent Labs  Lab 09/13/23 1019 09/13/23 1037 09/13/23 1859 09/13/23 2130 09/14/23 0403 09/15/23 0444 09/16/23 0443 09/17/23 0509  NA 129*  --   --   --  132* 133* 134*  --   K 2.5* 2.6* 3.0* 3.4* 3.4* 3.2* 3.9  --   CL 86*  --   --   --  93* 98 100  --   CO2  --   --   --   --  28 26  28  --   GLUCOSE 211*  --   --   --  112* 123* 105*  --   BUN 25*  --   --   --  19 15 15   --   CREATININE 1.40*  --   --   --  1.07 1.06 1.04  --   CALCIUM   --   --   --   --  8.0* 7.6* 8.0*  --   MG  --  2.3  --   --   --   --  2.5* 2.2   GFR: Estimated Creatinine Clearance: 69.9 mL/min (by C-G formula based on SCr of 1.04 mg/dL). Liver Function Tests: Recent Labs  Lab 09/14/23 0403  AST 18  ALT 13  ALKPHOS 63  BILITOT 0.8  PROT 6.2*  ALBUMIN 2.6*   No results for input(s): LIPASE, AMYLASE in the last 168 hours. No results for input(s): AMMONIA in the last 168 hours. Coagulation Profile: No results for input(s): INR, PROTIME in the last 168 hours. Cardiac Enzymes: No results for input(s): CKTOTAL, CKMB, CKMBINDEX, TROPONINI in the last 168 hours. BNP (last 3 results) Recent Labs    08/21/23 1016  PROBNP 1,787.0*    HbA1C: No results for input(s): HGBA1C in the last 72 hours. CBG: Recent Labs  Lab 09/15/23 2112 09/16/23 0839 09/16/23 1206 09/16/23 1624 09/16/23 2246  GLUCAP 118* 134* 124* 136* 135*   Lipid Profile: No results for input(s): CHOL, HDL, LDLCALC, TRIG, CHOLHDL, LDLDIRECT in the last 72 hours. Thyroid  Function Tests: No results for input(s): TSH, T4TOTAL, FREET4, T3FREE, THYROIDAB in the last 72 hours. Anemia Panel: No results for input(s): VITAMINB12, FOLATE, FERRITIN, TIBC, IRON , RETICCTPCT in the last 72 hours. Sepsis Labs: No results for input(s): PROCALCITON, LATICACIDVEN in the last 168 hours.  No results found for this or any previous visit (from the past 240 hours).       Radiology Studies: No results found.      Scheduled Meds:  sodium chloride    Intravenous Once   atorvastatin   20 mg Oral QHS   cyclobenzaprine   10 mg Oral TID   enoxaparin  (LOVENOX ) injection  40 mg Subcutaneous Q24H   insulin  aspart  0-15 Units Subcutaneous TID WC   pantoprazole   40 mg Oral Daily   pregabalin   200 mg Oral TID   sodium chloride  flush  3 mL Intravenous Q12H   spironolactone   25 mg Oral Daily   terbinafine   250 mg Oral Daily   zolpidem   10 mg Oral QHS   Continuous Infusions:  cefTRIAXone  (ROCEPHIN )  IV 2 g (09/16/23 0845)   And   metronidazole  Stopped (09/17/23 0219)     LOS: 4 days        Anthony CHRISTELLA Pouch, MD [facility] Pager [phone]  If 7PM-7AM, please contact night-coverage [email or web address] 09/17/2023, 9:27 AM

## 2023-09-17 NOTE — Plan of Care (Signed)

## 2023-09-18 ENCOUNTER — Inpatient Hospital Stay

## 2023-09-18 DIAGNOSIS — C189 Malignant neoplasm of colon, unspecified: Secondary | ICD-10-CM | POA: Diagnosis not present

## 2023-09-18 LAB — CBC
HCT: 29.5 % — ABNORMAL LOW (ref 39.0–52.0)
Hemoglobin: 8.5 g/dL — ABNORMAL LOW (ref 13.0–17.0)
MCH: 20.1 pg — ABNORMAL LOW (ref 26.0–34.0)
MCHC: 28.8 g/dL — ABNORMAL LOW (ref 30.0–36.0)
MCV: 69.7 fL — ABNORMAL LOW (ref 80.0–100.0)
Platelets: 240 K/uL (ref 150–400)
RBC: 4.23 MIL/uL (ref 4.22–5.81)
RDW: 22.5 % — ABNORMAL HIGH (ref 11.5–15.5)
WBC: 8.3 K/uL (ref 4.0–10.5)
nRBC: 0 % (ref 0.0–0.2)

## 2023-09-18 LAB — GLUCOSE, CAPILLARY
Glucose-Capillary: 121 mg/dL — ABNORMAL HIGH (ref 70–99)
Glucose-Capillary: 146 mg/dL — ABNORMAL HIGH (ref 70–99)
Glucose-Capillary: 122 mg/dL — ABNORMAL HIGH (ref 70–99)
Glucose-Capillary: 114 mg/dL — ABNORMAL HIGH (ref 70–99)

## 2023-09-18 LAB — BASIC METABOLIC PANEL WITH GFR
Anion gap: 8 (ref 5–15)
BUN: 13 mg/dL (ref 8–23)
CO2: 25 mmol/L (ref 22–32)
Calcium: 7.6 mg/dL — ABNORMAL LOW (ref 8.9–10.3)
Chloride: 98 mmol/L (ref 98–111)
Creatinine, Ser: 0.9 mg/dL (ref 0.61–1.24)
GFR, Estimated: >60 mL/min (ref >60)
Glucose, Bld: 103 mg/dL — ABNORMAL HIGH (ref 70–99)
Potassium: 3.9 mmol/L (ref 3.5–5.1)
Sodium: 131 mmol/L — ABNORMAL LOW (ref 135–145)

## 2023-09-18 LAB — MAGNESIUM: Magnesium: 1.7 mg/dL (ref 1.7–2.4)

## 2023-09-18 NOTE — Plan of Care (Signed)
   Problem: Education: Goal: Knowledge of General Education information will improve Description Including pain rating scale, medication(s)/side effects and non-pharmacologic comfort measures Outcome: Progressing

## 2023-09-18 NOTE — Progress Notes (Signed)
 PROGRESS NOTE    Erik Martinez  FMW:[id number] DOB: [birth year] DOA: 09/13/2023 PCP: [name], MD    Assessment & Plan:   Principal Problem:   Hypokalemia Active Problems:   Colon cancer (HCC)  Assessment and Plan:  Hypokalemia: WNL today     Colon cancer: likely etiology of abd pain. NPO as per gen surg. S/p robotic assisted lap colectomy. Still no gas or BM yet. Continue on IV flagyl  for stool leakage during surg as per gen surg. Norco, dilaudid  prn for pain    AKI: resolved    IDA: likely secondary to colon cancer. S/p 1 unit of pRBCs transfused. H&H are stable. Holding po iron  in case this is contributing to constipation/return of bowel function  Hyponatremia: labile. Will continue to monitor    Chronic diastolic CHF: holding home dose of lasix , metolazone . Monitor I/Os. Continue on aldactone     HLD: continue on statin    DM2: fair control, HbA1c 7.2 in July 2025. Continue on SSI w/ accuchecks  Peripheral neuropathy: continue on home dose of pregabalin           DVT prophylaxis: lovenox  Code Status: full  Family Communication:  Disposition Plan: as per primary team  Level of care: Med-Surg  Status is: Inpatient Remains inpatient appropriate because: severity of illness    Consultants:  Hospitalist service  Procedures:   Antimicrobials: flagyl    Subjective: Pt c/o being hungry   Objective: Vitals:   09/17/23 1435 09/17/23 2120 09/18/23 0226 09/18/23 0901  BP: 106/66 125/68 131/69 117/64  Pulse: 87 98  87  Resp: 18 18 16 16   Temp: 98.6 F (37 C) 99.2 F (37.3 C) 98.2 F (36.8 C) 98.2 F (36.8 C)  TempSrc:  Oral    SpO2: 99% 99% 100% 98%  Weight:      Height:        Intake/Output Summary (Last 24 hours) at 09/18/2023 0920 Last data filed at 09/18/2023 0646 Gross per 24 hour  Intake 419.59 ml  Output 1820 ml  Net -1400.41 ml   Filed Weights   09/13/23 0956 09/14/23 0809  Weight: 76.2 kg 77.1 kg    Examination:  General  exam: appears calm & comfortable  Respiratory system: clear breath sounds b/l  Cardiovascular system: S1 & S2+ Gastrointestinal system: abd is soft, tenderness to palpation, hypoactive bowel sounds  Central nervous system: alert & oriented. Moves all extremities   Psychiatry: judgement and insight appears normal. Flat mood and affect    Data Reviewed: I have personally reviewed following labs and imaging studies  CBC: Recent Labs  Lab 09/14/23 0403 09/15/23 0444 09/16/23 0443 09/17/23 0509 09/18/23 0347  WBC 5.0 9.8 8.3 7.8 8.3  HGB 7.5* 9.0* 8.6* 8.6* 8.5*  HCT 25.5* 30.0* 29.5* 29.6* 29.5*  MCV 68.0* 68.8* 70.1* 70.6* 69.7*  PLT 219 240 243 255 240   Basic Metabolic Panel: Recent Labs  Lab 09/13/23 1037 09/13/23 1859 09/14/23 0403 09/15/23 0444 09/16/23 0443 09/17/23 0509 09/17/23 1058 09/18/23 0347  NA  --   --  132* 133* 134*  --  138 131*  K 2.6*   < > 3.4* 3.2* 3.9  --  4.2 3.9  CL  --   --  93* 98 100  --  101 98  CO2  --   --  28 26 28   --  28 25  GLUCOSE  --   --  112* 123* 105*  --  114* 103*  BUN  --   --  19 15 15   --  14 13  CREATININE  --   --  1.07 1.06 1.04  --  0.91 0.90  CALCIUM   --   --  8.0* 7.6* 8.0*  --  8.5* 7.6*  MG 2.3  --   --   --  2.5* 2.2  --  1.7   < > = values in this interval not displayed.   GFR: Estimated Creatinine Clearance: 80.7 mL/min (by C-G formula based on SCr of 0.9 mg/dL). Liver Function Tests: Recent Labs  Lab 09/14/23 0403  AST 18  ALT 13  ALKPHOS 63  BILITOT 0.8  PROT 6.2*  ALBUMIN 2.6*   No results for input(s): LIPASE, AMYLASE in the last 168 hours. No results for input(s): AMMONIA in the last 168 hours. Coagulation Profile: No results for input(s): INR, PROTIME in the last 168 hours. Cardiac Enzymes: No results for input(s): CKTOTAL, CKMB, CKMBINDEX, TROPONINI in the last 168 hours. BNP (last 3 results) Recent Labs    08/21/23 1016  PROBNP 1,787.0*   HbA1C: No results for  input(s): HGBA1C in the last 72 hours. CBG: Recent Labs  Lab 09/17/23 0936 09/17/23 1139 09/17/23 1605 09/17/23 2118 09/18/23 0758  GLUCAP 163* 120* 84 114* 121*   Lipid Profile: No results for input(s): CHOL, HDL, LDLCALC, TRIG, CHOLHDL, LDLDIRECT in the last 72 hours. Thyroid  Function Tests: No results for input(s): TSH, T4TOTAL, FREET4, T3FREE, THYROIDAB in the last 72 hours. Anemia Panel: No results for input(s): VITAMINB12, FOLATE, FERRITIN, TIBC, IRON , RETICCTPCT in the last 72 hours. Sepsis Labs: No results for input(s): PROCALCITON, LATICACIDVEN in the last 168 hours.  No results found for this or any previous visit (from the past 240 hours).       Radiology Studies: DG Abd 1 View Result Date: 09/18/2023 CLINICAL DATA:  68 year old male with colorectal carcinoma. Postoperative day 4 status post right colectomy. Ileus. EXAM: ABDOMEN - 1 VIEW COMPARISON:  Restaging CT Chest, Abdomen, and Pelvis 08/25/2023. FINDINGS: Portable AP supine view at 0553 hours. Chronic lumbosacral fusion hardware, left hip arthroplasty, right sacral stimulator device. New midline and left abdominal skin staples. Percutaneous drain tracking to the right lateral abdomen, probably the right gutter region. Bowel-gas pattern similar to August CT. Gas-filled small bowel and residual large bowel loops with paucity of gas in the pelvis. Stable pelvic phlebolith. Stable visualized osseous structures. Calcified femoral artery atherosclerosis. IMPRESSION: 1. Postoperative changes with percutaneous drain in place. 2. Bowel-gas pattern compatible with postoperative ileus but also similar to August CT. Electronically Signed   By: VEAR Hurst M.D.   On: 09/18/2023 08:45        Scheduled Meds:  sodium chloride    Intravenous Once   atorvastatin   20 mg Oral QHS   cyclobenzaprine   10 mg Oral TID   enoxaparin  (LOVENOX ) injection  40 mg Subcutaneous Q24H   insulin  aspart  0-15  Units Subcutaneous TID WC   pantoprazole   40 mg Oral Daily   pregabalin   200 mg Oral TID   sodium chloride  flush  3 mL Intravenous Q12H   spironolactone   25 mg Oral Daily   terbinafine   250 mg Oral Daily   zolpidem   10 mg Oral QHS   Continuous Infusions:  cefTRIAXone  (ROCEPHIN )  IV 2 g (09/17/23 0956)   And   metronidazole  Stopped (09/17/23 2307)     LOS: 5 days        Anthony CHRISTELLA Pouch, MD [facility] Pager [phone]  If 7PM-7AM, please contact  night-coverage [email or web address] 09/18/2023, 9:20 AM

## 2023-09-18 NOTE — Progress Notes (Signed)
 [facility]- General Surgery  SURGICAL PROGRESS NOTE  Hospital Day(s): 5.   Post op day(s): 4 Days Post-Op.   Interval History:  Patient was seen and examined this morning. Appeared to be comfortable during encounter. Was again inquiring of when he could have food. Discussed the importance of waiting for the return of GI function.  Denies any flatulence or bowel movement. Denies any nausea or vomiting. Plan to repeat abdominal x-ray this AM.  220 cc output recorded from JP drain in the last 24 hrs.  Vital signs in last 24 hours: [min-max] current  Temp:  [98.2 F (36.8 C)-99.2 F (37.3 C)] 98.2 F (36.8 C) (09/08 0901) Pulse Rate:  [87-98] 87 (09/08 0901) Resp:  [16-18] 16 (09/08 0901) BP: (106-131)/(64-69) 117/64 (09/08 0901) SpO2:  [98 %-100 %] 98 % (09/08 0901)     Height: 5' 9 (175.3 cm) Weight: 77.1 kg BMI (Calculated): 25.09   Intake/Output last 2 shifts:  09/07 0701 - 09/08 0700 In: 419.6 [P.O.:120; IV Piggyback:299.6] Out: 1820 [Urine:1600; Drains:220]   Physical Exam:  Constitutional: alert, cooperative and no distress  Respiratory: breathing non-labored at rest  Cardiovascular: regular rate and sinus rhythm  Gastrointestinal: soft, mildly tender, and distended, honeycomb dressings are clean and dry, about 70 cc of serosanguineous output in JP drain   Labs:     Latest Ref Rng & Units 09/18/2023    3:47 AM 09/17/2023    5:09 AM 09/16/2023    4:43 AM  CBC  WBC 4.0 - 10.5 K/uL 8.3  7.8  8.3   Hemoglobin 13.0 - 17.0 g/dL 8.5  8.6  8.6   Hematocrit 39.0 - 52.0 % 29.5  29.6  29.5   Platelets 150 - 400 K/uL 240  255  243       Latest Ref Rng & Units 09/18/2023    3:47 AM 09/17/2023   10:58 AM 09/16/2023    4:43 AM  CMP  Glucose 70 - 99 mg/dL 896  885  894   BUN 8 - 23 mg/dL 13  14  15    Creatinine 0.61 - 1.24 mg/dL 9.09  9.08  8.95   Sodium 135 - 145 mmol/L 131  138  134   Potassium 3.5 - 5.1 mmol/L 3.9  4.2  3.9   Chloride 98 - 111 mmol/L 98  101  100   CO2 22 - 32  mmol/L 25  28  28    Calcium  8.9 - 10.3 mg/dL 7.6  8.5  8.0     Imaging studies:  CLINICAL DATA:  68 year old male with colorectal carcinoma. Postoperative day 4 status post right colectomy. Ileus.   EXAM: ABDOMEN - 1 VIEW   COMPARISON:  Restaging CT Chest, Abdomen, and Pelvis 08/25/2023.   FINDINGS: Portable AP supine view at 0553 hours. Chronic lumbosacral fusion hardware, left hip arthroplasty, right sacral stimulator device. New midline and left abdominal skin staples. Percutaneous drain tracking to the right lateral abdomen, probably the right gutter region. Bowel-gas pattern similar to August CT. Gas-filled small bowel and residual large bowel loops with paucity of gas in the pelvis. Stable pelvic phlebolith. Stable visualized osseous structures. Calcified femoral artery atherosclerosis.   IMPRESSION: 1. Postoperative changes with percutaneous drain in place. 2. Bowel-gas pattern compatible with postoperative ileus but also similar to August CT.     Electronically Signed   By: VEAR Hurst M.D.   On: 09/18/2023 08:45   Assessment/Plan:  68 y.o. male with right colon cancer 4 Days Post-Op s/p  robotic assisted laparoscopic right colectomy, complicated by pertinent comorbidities including HFpEF, type 2 diabetes mellitus,hepatitis B/C s/p treatment, hyperlipidemia, PAD, chronic pain syndrome, and GERD.    - Stable vital signs, no leukocytosis  - Abdominal x-ray shows changes consistent with ileus. Discussed plan with Dr. Tye, NGT not needed since patient is not feeling nauseous. Will continue to monitor closely.   - Continue NPO until return of GI function   - Continue IV rocephin /flagyl   - Continue to monitor JP drain output  - Continue to ambulate  - Continue IV fluids and pain management as needed     -- [employer] PA-C

## 2023-09-19 ENCOUNTER — Other Ambulatory Visit: Payer: Self-pay

## 2023-09-19 DIAGNOSIS — C189 Malignant neoplasm of colon, unspecified: Secondary | ICD-10-CM | POA: Diagnosis not present

## 2023-09-19 LAB — BASIC METABOLIC PANEL WITH GFR
Anion gap: 9 (ref 5–15)
BUN: 11 mg/dL (ref 8–23)
CO2: 24 mmol/L (ref 22–32)
Calcium: 8 mg/dL — ABNORMAL LOW (ref 8.9–10.3)
Chloride: 101 mmol/L (ref 98–111)
Creatinine, Ser: 0.85 mg/dL (ref 0.61–1.24)
GFR, Estimated: >60 mL/min (ref >60)
Glucose, Bld: 91 mg/dL (ref 70–99)
Potassium: 3.7 mmol/L (ref 3.5–5.1)
Sodium: 134 mmol/L — ABNORMAL LOW (ref 135–145)

## 2023-09-19 LAB — GLUCOSE, CAPILLARY
Glucose-Capillary: 109 mg/dL — ABNORMAL HIGH (ref 70–99)
Glucose-Capillary: 122 mg/dL — ABNORMAL HIGH (ref 70–99)
Glucose-Capillary: 187 mg/dL — ABNORMAL HIGH (ref 70–99)
Glucose-Capillary: 96 mg/dL (ref 70–99)

## 2023-09-19 LAB — SURGICAL PATHOLOGY

## 2023-09-19 LAB — MAGNESIUM: Magnesium: 1.8 mg/dL (ref 1.7–2.4)

## 2023-09-19 MED ORDER — TRAVASOL 10 % IV SOLN
INTRAVENOUS | Status: AC
  Filled 2023-09-19: qty 518.4

## 2023-09-19 MED ORDER — SODIUM CHLORIDE 0.9% FLUSH: 10.0000 mL | INTRAVENOUS | Status: DC | PRN

## 2023-09-19 MED ORDER — SODIUM CHLORIDE 0.9% FLUSH
10.0000 mL | Freq: Two times a day (BID) | INTRAVENOUS | Status: DC
  Administered 2023-09-19 – 2023-09-21 (×5): 10 mL
  Administered 2023-09-21: 20 mL
  Administered 2023-09-22: 10 mL

## 2023-09-19 MED ORDER — PREGABALIN 75 MG PO CAPS
200.0000 mg | ORAL_CAPSULE | Freq: Three times a day (TID) | ORAL | Status: DC
  Administered 2023-09-20 – 2023-09-22 (×7): 200 mg via ORAL
  Filled 2023-09-19 (×7): qty 1

## 2023-09-19 MED ORDER — INSULIN ASPART 100 UNIT/ML IJ SOLN
0.0000 [IU] | Freq: Four times a day (QID) | INTRAMUSCULAR | Status: DC
  Administered 2023-09-20 (×4): 3 [IU] via SUBCUTANEOUS
  Administered 2023-09-21: 8 [IU] via SUBCUTANEOUS
  Administered 2023-09-21: 5 [IU] via SUBCUTANEOUS
  Administered 2023-09-21: 2 [IU] via SUBCUTANEOUS
  Administered 2023-09-21: 3 [IU] via SUBCUTANEOUS
  Administered 2023-09-22: 5 [IU] via SUBCUTANEOUS
  Administered 2023-09-22: 3 [IU] via SUBCUTANEOUS
  Filled 2023-09-19 (×9): qty 1

## 2023-09-19 MED ORDER — THIAMINE HCL 100 MG/ML IJ SOLN
100.0000 mg | Freq: Every day | INTRAMUSCULAR | Status: DC
  Administered 2023-09-19 – 2023-09-22 (×4): 100 mg via INTRAVENOUS
  Filled 2023-09-19 (×4): qty 2

## 2023-09-19 MED ORDER — MAGNESIUM SULFATE 2 GM/50ML IV SOLN
2.0000 g | Freq: Once | INTRAVENOUS | Status: AC
  Administered 2023-09-19: 2 g via INTRAVENOUS
  Filled 2023-09-19: qty 50

## 2023-09-19 MED ORDER — CHLORHEXIDINE GLUCONATE CLOTH 2 % EX PADS
6.0000 | MEDICATED_PAD | Freq: Every day | CUTANEOUS | Status: DC
  Administered 2023-09-19 – 2023-09-22 (×4): 6 via TOPICAL

## 2023-09-19 MED ORDER — CYCLOBENZAPRINE HCL 10 MG PO TABS
10.0000 mg | ORAL_TABLET | Freq: Three times a day (TID) | ORAL | Status: DC
  Administered 2023-09-20 – 2023-09-22 (×7): 10 mg via ORAL
  Filled 2023-09-19 (×7): qty 1

## 2023-09-19 NOTE — Progress Notes (Signed)
 [facility]- General Surgery  SURGICAL PROGRESS NOTE  Hospital Day(s): 6.   Post op day(s): 5 Days Post-Op.   Interval History:  Patient seen and examined. No acute events or new complaints overnight. Still not passing gas or having a bowel movement. Discussed PICC and TPN with patient and he agrees. Symptoms are not worsening, pain is about the same. Denies any nausea or vomiting.  170 cc recorded output from JP drain in the last 24 hrs.   Vital signs in last 24 hours: [min-max] current  Temp:  [97.9 F (36.6 C)-98.4 F (36.9 C)] 97.9 F (36.6 C) (09/09 0340) Pulse Rate:  [84-99] 84 (09/09 0340) Resp:  [16] 16 (09/09 0340) BP: (105-117)/(62-69) 113/62 (09/09 0340) SpO2:  [97 %-98 %] 98 % (09/09 0340)     Height: 5' 9 (175.3 cm) Weight: 77.1 kg BMI (Calculated): 25.09   Intake/Output last 2 shifts:  09/08 0701 - 09/09 0700 In: 50  Out: 170 [Drains:170]   Physical Exam:  Constitutional: alert, cooperative and no distress  Respiratory: breathing non-labored at rest  Cardiovascular: regular rate and sinus rhythm  Gastrointestinal: soft, TTP on LUQ, and distended, more serous output in JP drain   Labs:     Latest Ref Rng & Units 09/18/2023    3:47 AM 09/17/2023    5:09 AM 09/16/2023    4:43 AM  CBC  WBC 4.0 - 10.5 K/uL 8.3  7.8  8.3   Hemoglobin 13.0 - 17.0 g/dL 8.5  8.6  8.6   Hematocrit 39.0 - 52.0 % 29.5  29.6  29.5   Platelets 150 - 400 K/uL 240  255  243       Latest Ref Rng & Units 09/19/2023    3:56 AM 09/18/2023    3:47 AM 09/17/2023   10:58 AM  CMP  Glucose 70 - 99 mg/dL 91  896  885   BUN 8 - 23 mg/dL 11  13  14    Creatinine 0.61 - 1.24 mg/dL 9.14  9.09  9.08   Sodium 135 - 145 mmol/L 134  131  138   Potassium 3.5 - 5.1 mmol/L 3.7  3.9  4.2   Chloride 98 - 111 mmol/L 101  98  101   CO2 22 - 32 mmol/L 24  25  28    Calcium  8.9 - 10.3 mg/dL 8.0  7.6  8.5     Imaging studies: No new pertinent imaging studies   Assessment/Plan:  68 y.o. male with right colon  cancer  5 Days Post-Op s/p robotic assisted laparoscopic right colectomy, complicated by pertinent comorbidities including HFpEF, type 2 diabetes mellitus,hepatitis B/C s/p treatment, hyperlipidemia, PAD, chronic pain syndrome, and GERD.    - Stable vital signs    - Still no return of GI function, continue NPO-  allow small sips of juice and non-carbonated drinks   - Ordered PICC to start TPN    - Completed 5-day course of IV antibiotic   - Pathology results still pending   - Encouraged to continue to ambulate, continue pain meds as needed    -- [employer] PA-C

## 2023-09-19 NOTE — Progress Notes (Signed)
 Initial Nutrition Assessment  DOCUMENTATION CODES:   Severe malnutrition in context of acute illness/injury  INTERVENTION:   TPN per pharmacy   Recommend thiamine  100mg  daily x 5-7 days   Pt at high refeed risk; recommend monitor potassium, magnesium  and phosphorus labs daily until stable  Daily weights   NUTRITION DIAGNOSIS:   Severe Malnutrition related to acute illness as evidenced by moderate fat depletion, moderate muscle depletion, 10 percent weight loss in 1 month.  GOAL:   Patient will meet greater than or equal to 90% of their needs  MONITOR:   Diet advancement, Labs, Weight trends, Skin, I & O's, Other (Comment) (TPN)  REASON FOR ASSESSMENT:   NPO/Clear Liquid Diet    ASSESSMENT:   68 y/o male with h/o DM, GERD, chronic pain, anxiety, depression, bipolar disorder, PTSD, PAD, HLD, DDD, neurogenic bladeer, umbilical hernia, CHF, IDA, substance abuse and HCV who is admitted with colon cancer s/p robotic assisted laparoscopic right colectomy 9/4 complicated by post op ileus.  Met with pt in room today; pt fully dressed, sitting in the chair and is anxious to eat real food and go home. Pt reports that he is feeling ok today but reports ongoing abdominal distension and bloating. Pt denies any nausea or vomiting. Pt reports that he has not passed flatus or a BM yet. Pt has been walking around. Pt has been eating ice chips.   Pt reports good appetite and oral intake at baseline but reports that he has lost around 20lbs in the past month. Pt's UBW is ~180lbs. Per chart, pt is down 19lbs(10%) in less than 1 month; this is significant weight loss. RD discussed with pt the importance of adequate nutrition needed to preserve lean muscle and support post op healing. Pt has been NPO since admission and is now without adequate nutrition for > 7 days. Plan today is for TPN initiation; PICC line placed today. Pt is at high refeed risk. Pt is agreeable to drink chocolate Ensure with  diet advancement (coupons provided today).   Of note, pt with recent episode of CHF. Pt is at high risk for volume overload; will monitor daily weights.   Medications reviewed and include: lovenox , insulin , protonix , aldactone , thiamine , TPN   Labs reviewed: Na 134(L), K 3.7 wnl, Mg 1.8 wnl BNP- 1648.0(H)- 8/25 Hgb 8.5(L), Hct 29.5(L), MCV 69.7(L), MCH 20.1(L), MCHC 28.8(L) Cbgs- 122, 109 x 24 hrs  AIC 7.4(H)- 7/8  Drains-   NUTRITION - FOCUSED PHYSICAL EXAM:  Flowsheet Row Most Recent Value  Orbital Region No depletion  Upper Arm Region Moderate depletion  Thoracic and Lumbar Region Mild depletion  Buccal Region Mild depletion  Temple Region Moderate depletion  Clavicle Bone Region Moderate depletion  Clavicle and Acromion Bone Region Moderate depletion  Scapular Bone Region Moderate depletion  Dorsal Hand Moderate depletion  Patellar Region Moderate depletion  Anterior Thigh Region Moderate depletion  Posterior Calf Region Moderate depletion  Edema (RD Assessment) Mild  Hair Reviewed  Eyes Reviewed  Mouth Reviewed  Skin Reviewed  Nails Reviewed   Diet Order:   Diet Order             Diet NPO time specified Except for: Sips with Meds, Ice Chips, Other (See Comments)  Diet effective midnight                  EDUCATION NEEDS:   Education needs have been addressed  Skin:  Skin Assessment: Reviewed RN Assessment (incision abdomen)  Last BM:  9/3  Height:   Ht Readings from Last 1 Encounters:  09/14/23 5' 9 (1.753 m)    Weight:   Wt Readings from Last 1 Encounters:  09/14/23 77.1 kg    Ideal Body Weight:  72.7 kg  BMI:  Body mass index is 25.1 kg/m.  Estimated Nutritional Needs:   Kcal:  2000-2300kcal/day  Protein:  100-115g/day  Fluid:  1.9-2.2L/day  Augustin Shams MS, RD, LDN If unable to be reached, please send secure chat to RD inpatient available from 8:00a-4:00p daily

## 2023-09-19 NOTE — Plan of Care (Signed)
  Problem: Education: Goal: Knowledge of General Education information will improve Description: Including pain rating scale, medication(s)/side effects and non-pharmacologic comfort measures Outcome: Progressing   Problem: Health Behavior/Discharge Planning: Goal: Ability to manage health-related needs will improve Outcome: Progressing   Problem: Clinical Measurements: Goal: Ability to maintain clinical measurements within normal limits will improve Outcome: Progressing Goal: Will remain free from infection Outcome: Progressing Goal: Diagnostic test results will improve Outcome: Progressing Goal: Respiratory complications will improve Outcome: Progressing Goal: Cardiovascular complication will be avoided Outcome: Progressing   Problem: Activity: Goal: Risk for activity intolerance will decrease Outcome: Progressing   Problem: Nutrition: Goal: Adequate nutrition will be maintained Outcome: Progressing   Problem: Coping: Goal: Level of anxiety will decrease Outcome: Progressing   Problem: Elimination: Goal: Will not experience complications related to bowel motility Outcome: Progressing Goal: Will not experience complications related to urinary retention Outcome: Progressing   Problem: Pain Managment: Goal: General experience of comfort will improve and/or be controlled Outcome: Progressing   Problem: Safety: Goal: Ability to remain free from injury will improve Outcome: Progressing   Problem: Skin Integrity: Goal: Risk for impaired skin integrity will decrease Outcome: Progressing   Problem: Education: Goal: Ability to describe self-care measures that may prevent or decrease complications (Diabetes Survival Skills Education) will improve Outcome: Progressing Goal: Individualized Educational Video(s) Outcome: Progressing   Problem: Fluid Volume: Goal: Ability to maintain a balanced intake and output will improve Outcome: Progressing   Problem: Skin  Integrity: Goal: Risk for impaired skin integrity will decrease Outcome: Progressing   Problem: Nutritional: Goal: Maintenance of adequate nutrition will improve Outcome: Progressing Goal: Progress toward achieving an optimal weight will improve Outcome: Progressing   Problem: Tissue Perfusion: Goal: Adequacy of tissue perfusion will improve Outcome: Progressing

## 2023-09-19 NOTE — Progress Notes (Signed)
 Peripherally Inserted Central Catheter Placement  The IV Nurse has discussed with the patient and/or persons authorized to consent for the patient, the purpose of this procedure and the potential benefits and risks involved with this procedure.  The benefits include less needle sticks, lab draws from the catheter, and the patient may be discharged home with the catheter. Risks include, but not limited to, infection, bleeding, blood clot (thrombus formation), and puncture of an artery; nerve damage and irregular heartbeat and possibility to perform a PICC exchange if needed/ordered by physician.  Alternatives to this procedure were also discussed.  Bard Power PICC patient education guide, fact sheet on infection prevention and patient information card has been provided to patient /or left at bedside.    PICC Placement Documentation  PICC Single Lumen (Active)  Indication for Insertion or Continuance of Line Administration of hyperosmolar/irritating solutions (i.e. TPN, Vancomycin , etc.) 09/19/23 1413  Exposed Catheter (cm) 1 cm 09/19/23 1413  Site Assessment Clean, Dry, Intact 09/19/23 1413  Line Status Flushed;Saline locked 09/19/23 1413     PICC Double Lumen 09/19/23 Right Brachial 40 cm 1 cm (Active)  Indication for Insertion or Continuance of Line Administration of hyperosmolar/irritating solutions (i.e. TPN, Vancomycin , etc.) 09/19/23 1417  Exposed Catheter (cm) 1 cm 09/19/23 1417  Site Assessment Clean, Dry, Intact 09/19/23 1417  Lumen #1 Status Flushed;Saline locked;Blood return noted 09/19/23 1417  Lumen #2 Status Flushed;Saline locked;Blood return noted 09/19/23 1417  Dressing Type Transparent;Securing device 09/19/23 1417  Dressing Status Antimicrobial disc/dressing in place 09/19/23 1417  Line Care Connections checked and tightened 09/19/23 1417  Line Adjustment (NICU/IV Team Only) No 09/19/23 1417  Dressing Intervention New dressing;Adhesive placed at insertion site (IV team only)  09/19/23 1417  Dressing Change Due 09/26/23 09/19/23 1417       Erik Martinez  Erik Martinez 09/19/2023, 2:18 PM

## 2023-09-19 NOTE — Progress Notes (Signed)
 PROGRESS NOTE   HPI: 67 y/o M w/ PMH of CHF, hepatic steatosis, HLD, DM2, GERD, peripheral neuropathy, ED who present for colectomy for colon cancer. Hospitalist service for consulted for hypokalemia and medical management. Pt c/o abd pain that is constant, dull, w/ radiation to the back. The severity of 6/10. Pain meds makes the pain better and moving makes the pain worse. Pt denies any fever, chills, sweating, chest pain, nausea, vomiting, dysuria, urinary urgency, urinary urgency, or constipation.     Erik Martinez  FMW:[id number] DOB: January 12, 1955 DOA: 09/13/2023 PCP: [name], MD    Assessment & Plan:   Principal Problem:   Hypokalemia Active Problems:   Colon cancer (HCC)  Assessment and Plan:  Hypokalemia: WNL today     Colon cancer: likely etiology of abd pain. NPO as per gen surg. S/p robotic assisted lap colectomy. Still no gas or BM. PICC line will be placed & pt started on TPN as per gen surg. Completed abx course for stool leakage during surg as per gen surg.  Oxy, norco & dilaudid  prn for pain    AKI: resolved    IDA: likely secondary to colon cancer. S/p 1 unit of pRBCs transfused. H&H are stable. Holding po iron  in case this is contributing to constipation/return of bowel function  Hyponatremia: labile. Will continue to monitor    Chronic diastolic CHF: holding home dose of lasix , metolazone . Continue on aldactone . Monitor I/Os   HLD: continue on statin    DM2: fair control, HbA1c 7.2 in July 2025. Continue on SSI w/ accuchecks   Peripheral neuropathy: continue on home dose of pregabalin          DVT prophylaxis: lovenox  Code Status: full  Family Communication:  Disposition Plan: as per primary team  Level of care: Med-Surg  Status is: Inpatient Remains inpatient appropriate because: severity of illness    Consultants:  Hospitalist service  Procedures:   Antimicrobials: flagyl    Subjective: Pt still c/o being hungry and wanting to go  home.   Objective: Vitals:   09/18/23 1753 09/18/23 2055 09/19/23 0340 09/19/23 0838  BP: 105/69 116/69 113/62 (!) 100/55  Pulse: 99 94 84 78  Resp: 16 16 16 12   Temp: 98.1 F (36.7 C) 98.4 F (36.9 C) 97.9 F (36.6 C) 97.8 F (36.6 C)  TempSrc: Oral Oral Oral   SpO2: 97% 98% 98% 97%  Weight:      Height:        Intake/Output Summary (Last 24 hours) at 09/19/2023 0943 Last data filed at 09/19/2023 0500 Gross per 24 hour  Intake 50 ml  Output 170 ml  Net -120 ml   Filed Weights   09/13/23 0956 09/14/23 0809  Weight: 76.2 kg 77.1 kg    Examination:  General exam: appears comfortable  Respiratory system: clear breath sounds b/l  Cardiovascular system: S1 & S2+. Gastrointestinal system: abd is soft, NT, distended & hypoactive bowel sounds  Central nervous system: alert & oriented. Moves all extremities    Psychiatry: judgement and insight appears normal. Flat mood and affect     Data Reviewed: I have personally reviewed following labs and imaging studies  CBC: Recent Labs  Lab 09/14/23 0403 09/15/23 0444 09/16/23 0443 09/17/23 0509 09/18/23 0347  WBC 5.0 9.8 8.3 7.8 8.3  HGB 7.5* 9.0* 8.6* 8.6* 8.5*  HCT 25.5* 30.0* 29.5* 29.6* 29.5*  MCV 68.0* 68.8* 70.1* 70.6* 69.7*  PLT 219 240 243 255 240   Basic Metabolic Panel: Recent Labs  Lab 09/13/23 1037 09/13/23 1859 09/15/23 0444 09/16/23 0443 09/17/23 0509 09/17/23 1058 09/18/23 0347 09/19/23 0356  NA  --    < > 133* 134*  --  138 131* 134*  K 2.6*   < > 3.2* 3.9  --  4.2 3.9 3.7  CL  --    < > 98 100  --  101 98 101  CO2  --    < > 26 28  --  28 25 24   GLUCOSE  --    < > 123* 105*  --  114* 103* 91  BUN  --    < > 15 15  --  14 13 11   CREATININE  --    < > 1.06 1.04  --  0.91 0.90 0.85  CALCIUM   --    < > 7.6* 8.0*  --  8.5* 7.6* 8.0*  MG 2.3  --   --  2.5* 2.2  --  1.7 1.8   < > = values in this interval not displayed.   GFR: Estimated Creatinine Clearance: 85.5 mL/min (by C-G formula based on  SCr of 0.85 mg/dL). Liver Function Tests: Recent Labs  Lab 09/14/23 0403  AST 18  ALT 13  ALKPHOS 63  BILITOT 0.8  PROT 6.2*  ALBUMIN 2.6*   No results for input(s): LIPASE, AMYLASE in the last 168 hours. No results for input(s): AMMONIA in the last 168 hours. Coagulation Profile: No results for input(s): INR, PROTIME in the last 168 hours. Cardiac Enzymes: No results for input(s): CKTOTAL, CKMB, CKMBINDEX, TROPONINI in the last 168 hours. BNP (last 3 results) Recent Labs    08/21/23 1016  PROBNP 1,787.0*   HbA1C: No results for input(s): HGBA1C in the last 72 hours. CBG: Recent Labs  Lab 09/18/23 0758 09/18/23 1124 09/18/23 1646 09/18/23 2100 09/19/23 0742  GLUCAP 121* 146* 122* 114* 109*   Lipid Profile: No results for input(s): CHOL, HDL, LDLCALC, TRIG, CHOLHDL, LDLDIRECT in the last 72 hours. Thyroid  Function Tests: No results for input(s): TSH, T4TOTAL, FREET4, T3FREE, THYROIDAB in the last 72 hours. Anemia Panel: No results for input(s): VITAMINB12, FOLATE, FERRITIN, TIBC, IRON , RETICCTPCT in the last 72 hours. Sepsis Labs: No results for input(s): PROCALCITON, LATICACIDVEN in the last 168 hours.  No results found for this or any previous visit (from the past 240 hours).       Radiology Studies: US  EKG SITE RITE Result Date: 09/19/2023 If Site Rite image not attached, placement could not be confirmed due to current cardiac rhythm.  DG Abd 1 View Result Date: 09/18/2023 CLINICAL DATA:  68 year old male with colorectal carcinoma. Postoperative day 4 status post right colectomy. Ileus. EXAM: ABDOMEN - 1 VIEW COMPARISON:  Restaging CT Chest, Abdomen, and Pelvis 08/25/2023. FINDINGS: Portable AP supine view at 0553 hours. Chronic lumbosacral fusion hardware, left hip arthroplasty, right sacral stimulator device. New midline and left abdominal skin staples. Percutaneous drain tracking to the right lateral  abdomen, probably the right gutter region. Bowel-gas pattern similar to August CT. Gas-filled small bowel and residual large bowel loops with paucity of gas in the pelvis. Stable pelvic phlebolith. Stable visualized osseous structures. Calcified femoral artery atherosclerosis. IMPRESSION: 1. Postoperative changes with percutaneous drain in place. 2. Bowel-gas pattern compatible with postoperative ileus but also similar to August CT. Electronically Signed   By: VEAR Hurst M.D.   On: 09/18/2023 08:45        Scheduled Meds:  sodium chloride    Intravenous Once   atorvastatin   20 mg Oral QHS   cyclobenzaprine   10 mg Oral TID   enoxaparin  (LOVENOX ) injection  40 mg Subcutaneous Q24H   insulin  aspart  0-15 Units Subcutaneous Q6H   pantoprazole   40 mg Oral Daily   pregabalin   200 mg Oral TID   sodium chloride  flush  3 mL Intravenous Q12H   spironolactone   25 mg Oral Daily   terbinafine   250 mg Oral Daily   thiamine  (VITAMIN B1) injection  100 mg Intravenous Daily   zolpidem   10 mg Oral QHS   Continuous Infusions:  magnesium  sulfate bolus IVPB       LOS: 6 days        Anthony CHRISTELLA Pouch, MD [facility] Pager [phone]  If 7PM-7AM, please contact night-coverage [email or web address] 09/19/2023, 9:43 AM

## 2023-09-19 NOTE — Consult Note (Signed)
 PHARMACY - TOTAL PARENTERAL NUTRITION CONSULT NOTE   Indication: Prolonged ileus  Patient Measurements: Height: 5' 9 (175.3 cm) Weight: 77.1 kg (170 lb) IBW/kg (Calculated) : 70.7 TPN AdjBW (KG): 77.1 Body mass index is 25.1 kg/m.   Assessment: 68yo male with PPMH including HLD, CHF, T2DM, GERD, peripheral neuropathy.  Patient is Post-Op day 5 for right colectomy secondary to colon cancer.  Patient has not had return of bowel function.  Pharmacy consulted for initiation of TPN due to prolonged ileus.   Glucose / Insulin : T2DM, A1C 7.2 in July 2025 Start moderate SSI q6h Electrolytes:  Na 134 Mg 1.8, replacing outside of bag Renal: Scr < 1 Hepatic: Will order LFTs for 9/10 Intake / Output; MIVF:  No current MIVF  Intake/Output Summary (Last 24 hours) at 09/19/2023 0932 Last data filed at 09/19/2023 0500 Gross per 24 hour  Intake 50 ml  Output 170 ml  Net -120 ml    GI Imaging: 9/8: Bowel-gas pattern compatible with postoperative ileus  GI Surgeries / Procedures:  9/4: Right colectomy  Central access: Order placed for PICC line TPN start date: 09/19/23  Nutritional Goals: Goal TPN rate is 85 mL/hr (provides 110 g of protein and 2093 kcals per day)  RD Assessment: Estimated Needs Total Energy Estimated Needs: 2000-2300kcal/day Total Protein Estimated Needs: 100-115g/day Total Fluid Estimated Needs: 1.9-2.2L/day  Current Nutrition:  NPO  Plan:  Start TPN at 40 mL/hr at 1800 Electrolytes in TPN: Na 1mEq/L, K 98mEq/L, Ca 85mEq/L, Mg 78mEq/L, and Phos 15mmol/L. Cl:Ac 1:1 Add standard MVI and trace elements to TPN Initiate Moderate q6h SSI and adjust as needed  Monitor TPN labs on Mon/Thurs, CMP, Mg, Phos ordered for 9/10 Thiamine  100 mg IV daily x 7 days per dietary  Kayla JULIANNA Blew, PharmD 09/19/2023,9:26 AM

## 2023-09-20 DIAGNOSIS — E876 Hypokalemia: Secondary | ICD-10-CM | POA: Diagnosis not present

## 2023-09-20 DIAGNOSIS — E43 Unspecified severe protein-calorie malnutrition: Secondary | ICD-10-CM | POA: Insufficient documentation

## 2023-09-20 LAB — GLUCOSE, CAPILLARY
Glucose-Capillary: 154 mg/dL — ABNORMAL HIGH (ref 70–99)
Glucose-Capillary: 147 mg/dL — ABNORMAL HIGH (ref 70–99)
Glucose-Capillary: 167 mg/dL — ABNORMAL HIGH (ref 70–99)
Glucose-Capillary: 182 mg/dL — ABNORMAL HIGH (ref 70–99)

## 2023-09-20 LAB — COMPREHENSIVE METABOLIC PANEL WITH GFR
ALT: 8 U/L (ref 0–44)
AST: 14 U/L — ABNORMAL LOW (ref 15–41)
Albumin: 2.3 g/dL — ABNORMAL LOW (ref 3.5–5.0)
Alkaline Phosphatase: 47 U/L (ref 38–126)
Anion gap: 6 (ref 5–15)
BUN: 13 mg/dL (ref 8–23)
CO2: 27 mmol/L (ref 22–32)
Calcium: 8.1 mg/dL — ABNORMAL LOW (ref 8.9–10.3)
Chloride: 102 mmol/L (ref 98–111)
Creatinine, Ser: 0.85 mg/dL (ref 0.61–1.24)
GFR, Estimated: >60 mL/min (ref >60)
Glucose, Bld: 137 mg/dL — ABNORMAL HIGH (ref 70–99)
Potassium: 3.9 mmol/L (ref 3.5–5.1)
Sodium: 135 mmol/L (ref 135–145)
Total Bilirubin: 0.6 mg/dL (ref 0.0–1.2)
Total Protein: 5.9 g/dL — ABNORMAL LOW (ref 6.5–8.1)

## 2023-09-20 LAB — PHOSPHORUS: Phosphorus: 2.9 mg/dL (ref 2.5–4.6)

## 2023-09-20 LAB — MAGNESIUM: Magnesium: 2.1 mg/dL (ref 1.7–2.4)

## 2023-09-20 MED ORDER — TRAVASOL 10 % IV SOLN
INTRAVENOUS | Status: AC
  Filled 2023-09-20: qty 1101.6

## 2023-09-20 MED ORDER — BOOST / RESOURCE BREEZE PO LIQD CUSTOM
1.0000 | Freq: Three times a day (TID) | ORAL | Status: DC
  Administered 2023-09-20: 1 via ORAL

## 2023-09-20 NOTE — Progress Notes (Signed)
 Progress Note    [name]  FMW:[id number] DOB: 1955-11-21  DOA: 09/13/2023 PCP: [name], MD      Brief Narrative:    Medical records reviewed and are as summarized below:  Erik Martinez is a 68 y.o. male with past medical history significant for chronic diastolic CHF, hepatic steatosis, HLD, DM2, GERD, peripheral neuropathy, ED who presented for colectomy for colon cancer. Hospitalist service for consulted for hypokalemia and medical management.         Assessment/Plan:   Principal Problem:   Hypokalemia Active Problems:   Colon cancer (HCC)   Protein-calorie malnutrition, severe   Nutrition Problem: Severe Malnutrition Etiology: acute illness  Signs/Symptoms: moderate fat depletion, moderate muscle depletion, percent weight loss Percent weight loss: 10 %   Body mass index is 26.18 kg/m.   Colon cancer: S/p robotic assisted laparoscopic right colectomy on 09/14/2023.  He has been started on clear liquid diet.  He remains on TPN.  Follow-up with general surgeon.   Hyponatremia, hypokalemia: Improved   Iron  deficiency anemia: Likely due to colon cancer.  S/p transfusion 1 unit of PRBCs.   Chronic diastolic CHF: Compensated.   He is on spironolactone .  Lasix  and metolazone  on hold. 2D echo on 08/24/2022 showed EF estimated at 50 to 55%, grade 1 diastolic dysfunction, mild to moderate MR.   Comorbidities include type II DM ( hemoglobin A1c 7.2 in July 2025), hyperlipidemia, peripheral neuropathy  Diet Order             Diet clear liquid Room service appropriate? Yes; Fluid consistency: Thin  Diet effective now                                  Consultants: General Surgeon  Procedures: Robotic assisted laparoscopic right colectomy     Medications:    sodium chloride    Intravenous Once   atorvastatin   20 mg Oral QHS   Chlorhexidine  Gluconate Cloth  6 each Topical Daily   cyclobenzaprine   10 mg Oral TID    enoxaparin  (LOVENOX ) injection  40 mg Subcutaneous Q24H   insulin  aspart  0-15 Units Subcutaneous Q6H   pantoprazole   40 mg Oral Daily   pregabalin   200 mg Oral TID   sodium chloride  flush  10-40 mL Intracatheter Q12H   sodium chloride  flush  3 mL Intravenous Q12H   spironolactone   25 mg Oral Daily   terbinafine   250 mg Oral Daily   thiamine  (VITAMIN B1) injection  100 mg Intravenous Daily   zolpidem   10 mg Oral QHS   Continuous Infusions:  TPN ADULT (ION) 40 mL/hr at 09/19/23 1830   TPN ADULT (ION)       Anti-infectives (From admission, onward)    Start     Dose/Rate Route Frequency Ordered Stop   09/14/23 1430  cefTRIAXone  (ROCEPHIN ) 2 g in sodium chloride  0.9 % 100 mL IVPB       Placed in And Linked Group   2 g 200 mL/hr over 30 Minutes Intravenous Daily 09/14/23 1347 09/18/23 1025   09/14/23 1430  metroNIDAZOLE  (FLAGYL ) IVPB 500 mg       Placed in And Linked Group   500 mg 100 mL/hr over 60 Minutes Intravenous 2 times daily 09/14/23 1347 09/18/23 2216   09/13/23 1630  terbinafine  (LAMISIL ) tablet 250 mg        250 mg Oral Daily 09/13/23 1532  09/13/23 0945  cefoTEtan  (CEFOTAN ) 2 g in sodium chloride  0.9 % 100 mL IVPB  Status:  Discontinued        2 g 200 mL/hr over 30 Minutes Intravenous On call to O.R. 09/13/23 0930 09/13/23 1526              Family Communication/Anticipated D/C date and plan/Code Status   DVT prophylaxis: enoxaparin  (LOVENOX ) injection 40 mg Start: 09/15/23 0800 SCD's Start: 09/14/23 1348 SCDs Start: 09/13/23 1237     Code Status: Full Code  Family Communication: None Disposition Plan: Plan to discharge home         Subjective:   Interval events noted.  He said he passed gas but no bowel movement.  Objective:    Vitals:   09/19/23 2112 09/20/23 0500 09/20/23 0521 09/20/23 0804  BP: 127/66  115/67 97/73  Pulse: 90  84 96  Resp: 16  16 16   Temp: 99.3 F (37.4 C)  98.7 F (37.1 C) 98.6 F (37 C)  TempSrc: Oral   Oral Oral  SpO2: 98%  95% 100%  Weight:  80.4 kg    Height:       No data found.   Intake/Output Summary (Last 24 hours) at 09/20/2023 1621 Last data filed at 09/20/2023 1147 Gross per 24 hour  Intake 627.96 ml  Output 245 ml  Net 382.96 ml   Filed Weights   09/13/23 0956 09/14/23 0809 09/20/23 0500  Weight: 76.2 kg 77.1 kg 80.4 kg    Exam:  GEN: NAD SKIN: Warm and dry EYES: No pallor or icterus ENT: MMM CV: RRR PULM: CTA B ABD: soft, ND, NT, +BS, multiple surgical incisions clean, dry and intact, JP drain in place with only small amount of serosanguineous fluid CNS: AAO x 3, non focal EXT: No edema or tenderness        Data Reviewed:   I have personally reviewed following labs and imaging studies:  Labs: Labs show the following:   Basic Metabolic Panel: Recent Labs  Lab 09/16/23 0443 09/17/23 0509 09/17/23 1058 09/18/23 0347 09/19/23 0356 09/20/23 0530  NA 134*  --  138 131* 134* 135  K 3.9  --  4.2 3.9 3.7 3.9  CL 100  --  101 98 101 102  CO2 28  --  28 25 24 27   GLUCOSE 105*  --  114* 103* 91 137*  BUN 15  --  14 13 11 13   CREATININE 1.04  --  0.91 0.90 0.85 0.85  CALCIUM  8.0*  --  8.5* 7.6* 8.0* 8.1*  MG 2.5* 2.2  --  1.7 1.8 2.1  PHOS  --   --   --   --   --  2.9   GFR Estimated Creatinine Clearance: 85.5 mL/min (by C-G formula based on SCr of 0.85 mg/dL). Liver Function Tests: Recent Labs  Lab 09/14/23 0403 09/20/23 0530  AST 18 14*  ALT 13 8  ALKPHOS 63 47  BILITOT 0.8 0.6  PROT 6.2* 5.9*  ALBUMIN 2.6* 2.3*   No results for input(s): LIPASE, AMYLASE in the last 168 hours. No results for input(s): AMMONIA in the last 168 hours. Coagulation profile No results for input(s): INR, PROTIME in the last 168 hours.  CBC: Recent Labs  Lab 09/14/23 0403 09/15/23 0444 09/16/23 0443 09/17/23 0509 09/18/23 0347  WBC 5.0 9.8 8.3 7.8 8.3  HGB 7.5* 9.0* 8.6* 8.6* 8.5*  HCT 25.5* 30.0* 29.5* 29.6* 29.5*  MCV 68.0* 68.8* 70.1*  70.6*  69.7*  PLT 219 240 243 255 240   Cardiac Enzymes: No results for input(s): CKTOTAL, CKMB, CKMBINDEX, TROPONINI in the last 168 hours. BNP (last 3 results) Recent Labs    08/21/23 1016  PROBNP 1,787.0*   CBG: Recent Labs  Lab 09/19/23 1641 09/19/23 2345 09/20/23 0522 09/20/23 0801 09/20/23 1144  GLUCAP 96 187* 154* 147* 167*   D-Dimer: No results for input(s): DDIMER in the last 72 hours. Hgb A1c: No results for input(s): HGBA1C in the last 72 hours. Lipid Profile: No results for input(s): CHOL, HDL, LDLCALC, TRIG, CHOLHDL, LDLDIRECT in the last 72 hours. Thyroid  function studies: No results for input(s): TSH, T4TOTAL, T3FREE, THYROIDAB in the last 72 hours.  Invalid input(s): FREET3 Anemia work up: No results for input(s): VITAMINB12, FOLATE, FERRITIN, TIBC, IRON , RETICCTPCT in the last 72 hours. Sepsis Labs: Recent Labs  Lab 09/15/23 0444 09/16/23 0443 09/17/23 0509 09/18/23 0347  WBC 9.8 8.3 7.8 8.3    Microbiology No results found for this or any previous visit (from the past 240 hours).  Procedures and diagnostic studies:  US  EKG SITE RITE Result Date: 09/19/2023 If Site Rite image not attached, placement could not be confirmed due to current cardiac rhythm.              LOS: 7 days   Teasia Zapf  [facility]   Pager on [email or web address]. If 7PM-7AM, please contact night-coverage at [email or web address]     09/20/2023, 4:21 PM

## 2023-09-20 NOTE — Consult Note (Signed)
 PHARMACY - TOTAL PARENTERAL NUTRITION CONSULT NOTE   Indication: Prolonged ileus  Patient Measurements: Height: 5' 9 (175.3 cm) Weight: 80.4 kg (177 lb 4 oz) IBW/kg (Calculated) : 70.7 TPN AdjBW (KG): 77.1 Body mass index is 26.18 kg/m.   Assessment: 68yo male with PPMH including HLD, CHF, T2DM, GERD, peripheral neuropathy.  Patient is Post-Op day 5 for right colectomy secondary to colon cancer.  Patient has not had return of bowel function.  Pharmacy consulted for initiation of TPN due to prolonged ileus.   Glucose / Insulin : T2DM, A1C 7.2 in July 2025 Start moderate SSI q6h BG 96-187, 6u Novolog  in past 24 hours Electrolytes: WNL Renal: Scr < 1 Hepatic: WNL Intake / Output; MIVF:  No current MIVF  Intake/Output Summary (Last 24 hours) at 09/20/2023 0825 Last data filed at 09/20/2023 9392 Gross per 24 hour  Intake 627.96 ml  Output 145 ml  Net 482.96 ml    GI Imaging: 9/8: Bowel-gas pattern compatible with postoperative ileus  GI Surgeries / Procedures:  9/4: Right colectomy  Central access: PICC line TPN start date: 09/19/23  Nutritional Goals: Goal TPN rate is 85 mL/hr (provides 110 g of protein and 2093 kcals per day)  RD Assessment: Estimated Needs Total Energy Estimated Needs: 2000-2300kcal/day Total Protein Estimated Needs: 100-115g/day Total Fluid Estimated Needs: 1.9-2.2L/day  Current Nutrition:  CLD  Plan:  Increase TPN to 85 mL/hr (goal rate) at 1800 Electrolytes in TPN: Na 67mEq/L, K 13mEq/L, Ca 49mEq/L, Mg 34mEq/L, and Phos 15mmol/L. Cl:Ac 1:1 Add standard MVI and trace elements to TPN Initiate Moderate q6h SSI and adjust as needed  Monitor TPN labs on Mon/Thurs Thiamine  100 mg IV daily x 7 days per dietary  Kayla JULIANNA Blew, PharmD 09/20/2023,8:25 AM

## 2023-09-20 NOTE — Progress Notes (Cosign Needed)
 [facility]- General Surgery  SURGICAL PROGRESS NOTE  Hospital Day(s): 7.   Post op day(s): 6 Days Post-Op.   Interval History:  Patient is overall doing well. States he passed gas last night and this morning for the first time since surgery. Was having small sips of juice, tolerated well. Reports leakage around JP drain. Otherwise has no complaints.  Abdominal pain has been about the same.  PICC line was placed, and started TPN yesterday.   Vital signs in last 24 hours: [min-max] current  Temp:  [98.4 F (36.9 C)-99.3 F (37.4 C)] 98.6 F (37 C) (09/10 0804) Pulse Rate:  [79-96] 96 (09/10 0804) Resp:  [16-20] 16 (09/10 0804) BP: (97-127)/(66-73) 97/73 (09/10 0804) SpO2:  [95 %-100 %] 100 % (09/10 0804) Weight:  [80.4 kg] 80.4 kg (09/10 0500)     Height: 5' 9 (175.3 cm) Weight: 80.4 kg BMI (Calculated): 26.16   Intake/Output last 2 shifts:  09/09 0701 - 09/10 0700 In: 628 [P.O.:180; I.V.:448] Out: 145 [Drains:145]   Physical Exam:  Constitutional: alert, cooperative and no distress  Respiratory: breathing non-labored at rest  Cardiovascular: regular rate and sinus rhythm  Gastrointestinal: soft, mildly tender, and non-distended, serous output from JP drain, change dressing.  Honeycomb dressings intact, clean and dry  Labs:     Latest Ref Rng & Units 09/18/2023    3:47 AM 09/17/2023    5:09 AM 09/16/2023    4:43 AM  CBC  WBC 4.0 - 10.5 K/uL 8.3  7.8  8.3   Hemoglobin 13.0 - 17.0 g/dL 8.5  8.6  8.6   Hematocrit 39.0 - 52.0 % 29.5  29.6  29.5   Platelets 150 - 400 K/uL 240  255  243       Latest Ref Rng & Units 09/20/2023    5:30 AM 09/19/2023    3:56 AM 09/18/2023    3:47 AM  CMP  Glucose 70 - 99 mg/dL 862  91  896   BUN 8 - 23 mg/dL 13  11  13    Creatinine 0.61 - 1.24 mg/dL 9.14  9.14  9.09   Sodium 135 - 145 mmol/L 135  134  131   Potassium 3.5 - 5.1 mmol/L 3.9  3.7  3.9   Chloride 98 - 111 mmol/L 102  101  98   CO2 22 - 32 mmol/L 27  24  25    Calcium  8.9 - 10.3  mg/dL 8.1  8.0  7.6   Total Protein 6.5 - 8.1 g/dL 5.9     Total Bilirubin 0.0 - 1.2 mg/dL 0.6     Alkaline Phos 38 - 126 U/L 47     AST 15 - 41 U/L 14     ALT 0 - 44 U/L 8      Pathology results:  SURGICAL PATHOLOGY [facility] [address] [place], [place] [address] Telephone [phone] or [phone] Fax [phone]  REPORT OF SURGICAL PATHOLOGY   Accession #: [id number] Patient Name: GASPARE, NETZEL Visit # : [id number]  MRN: [id number] Physician: Tye Millet DOB/Age [birth year] (Age: 35) Gender: M Collected Date: 09/14/2023 Received Date: 09/14/2023  FINAL DIAGNOSIS       1. Colon, segmental resection for tumor, right :      - INVASIVE ADENOCARCINOMA, MODERATELY DIFFERENTIATED, 4.0 CM, FOCALLY INVADING      VISCERAL PERITONEUM.      - MARGINS NEGATIVE FOR CARCINOMA OR DYSPLASIA.      - TWENTY-THREE LYMPH  NODES, ALL NEGATIVE FOR CARCINOMA (0/23).      - BENIGN APPENDIX WITH FIBROUS OBLITERATION OF THE LUMEN.   DATE SIGNED OUT: 09/19/2023 ELECTRONIC SIGNATURE : Coronel Md, Misti, Sports administrator, International aid/development worker  MICROSCOPIC DESCRIPTION  CASE COMMENTS STAINS USED IN DIAGNOSIS: H&E H&E H&E H&E H&E H&E H&E H&E H&E H&E H&E H&E H&E H&E H&E H&E H&E H&E H&E H&E H&E H&E H&E H&E H&E *RECUT DEEPER X 2 LEVELS *RECUT DEEPER X 2 LEVELS    CLINICAL HISTORY  SPECIMEN(S) OBTAINED 1. Colon, segmental resection for tumor, Right  SPECIMEN COMMENTS: SPECIMEN CLINICAL INFORMATION:  Gross Description 1. Specimen: terminal ileum, appendix, cecum, ascending colon, proximal transverse colon      Specimen integrity: intact      Specimen length: ileum - 3.7 cm, colon - 32 cm      Mesorectal intactness: Not applicable      Tumor location: proximal transverse colon/flexure      Tumor size: 4.0 x 2.3 x 0.8 cm      Percent of bowel circumference involved: 100%      Tumor distance to margins:      Proximal: 23 cm       Distal: 9.5 cm      Mesenteric: 3.0 cm      Radial: 0.5 cm      Macroscopic extent of tumor invasion:      Tumor invades through the muscularis propria into the subserosal adipose tissue      or the      non-peritonealized pericolic or perirectal soft tissues but does not involve the      visceral serosa      Total presumed lymph nodes: ileocecal/proximal right colon: 14 up to 0.8 cm,      distal right/proximal transverse colon: 10 up to 0.7 cm      Extramural satellite tumor nodules: none      Mucosal polyp(s): none      Additional findings: submucosal tattoo distal to tumor, otherwise unremarkable      colon and ileum. The appendix is 7.5 cm long, 0.7 cm diameter, unremarkable.      Block summary:      1A - proximal margin, en face      1B - distal margin, en face      1C - closest mesenteric margin/vascular pedicle, en face      1D - tumor to proximal normal      1E - tumor to distal normal      34F - tumor with closest retroperitoneal soft tissue margin (black),      perpendicular      1G - tumor and separate normal      1H - tumor with closest serosa, perpendicular (black)      1I - additional tumor      1J - tattoo site      1K - appendix cross section and 1/2 tip      ILEOCECAL/PROXIMAL RIGHT COLON LYMPH NODES      1L - 5      32M - 4      1N-1R - 1 each, sectioned      DISTAL RIGHT COLON/PROXIMAL TRANSVERSE LYMPH NODES      1S - 4      1T-1Y - 1 each, sectioned      mb 09-15-23   Report signed out from the following location(s) [place] [address] [place], [place] [address] CLIA #: [id number]  [facility] [address] [place], [place] [address] CLIA #: [id number]   Imaging studies: No new pertinent imaging studies   Assessment/Plan:  68 y.o. male with invasive adenocarcinoma of right colon 6 Days Post-Op s/p robotic assisted laparoscopic right colectomy, complicated by pertinent comorbidities including HFpEF, type 2  diabetes mellitus,hepatitis B/C s/p treatment, hyperlipidemia, PAD, chronic pain syndrome, and GERD.    - Stable vital signs  - Started clear liquid diet this morning, will advance to full liquid once patient has bowel movement. Continue TPN  - Pathology report came back with negative lymph nodes.  - Continue pain management as needed  - Continue to ambulate  -- Corah Willeford Barrientos PA-C

## 2023-09-21 DIAGNOSIS — E876 Hypokalemia: Secondary | ICD-10-CM | POA: Diagnosis not present

## 2023-09-21 LAB — COMPREHENSIVE METABOLIC PANEL WITH GFR
ALT: 7 U/L (ref 0–44)
AST: 16 U/L (ref 15–41)
Albumin: 2.3 g/dL — ABNORMAL LOW (ref 3.5–5.0)
Alkaline Phosphatase: 46 U/L (ref 38–126)
Anion gap: 9 (ref 5–15)
BUN: 15 mg/dL (ref 8–23)
CO2: 25 mmol/L (ref 22–32)
Calcium: 8.5 mg/dL — ABNORMAL LOW (ref 8.9–10.3)
Chloride: 103 mmol/L (ref 98–111)
Creatinine, Ser: 0.63 mg/dL (ref 0.61–1.24)
GFR, Estimated: >60 mL/min (ref >60)
Glucose, Bld: 144 mg/dL — ABNORMAL HIGH (ref 70–99)
Potassium: 4.5 mmol/L (ref 3.5–5.1)
Sodium: 137 mmol/L (ref 135–145)
Total Bilirubin: 0.5 mg/dL (ref 0.0–1.2)
Total Protein: 6.1 g/dL — ABNORMAL LOW (ref 6.5–8.1)

## 2023-09-21 LAB — GLUCOSE, CAPILLARY
Glucose-Capillary: 150 mg/dL — ABNORMAL HIGH (ref 70–99)
Glucose-Capillary: 155 mg/dL — ABNORMAL HIGH (ref 70–99)
Glucose-Capillary: 173 mg/dL — ABNORMAL HIGH (ref 70–99)
Glucose-Capillary: 206 mg/dL — ABNORMAL HIGH (ref 70–99)
Glucose-Capillary: 215 mg/dL — ABNORMAL HIGH (ref 70–99)
Glucose-Capillary: 268 mg/dL — ABNORMAL HIGH (ref 70–99)

## 2023-09-21 LAB — CBC
HCT: 31.8 % — ABNORMAL LOW (ref 39.0–52.0)
Hemoglobin: 9.5 g/dL — ABNORMAL LOW (ref 13.0–17.0)
MCH: 20.6 pg — ABNORMAL LOW (ref 26.0–34.0)
MCHC: 29.9 g/dL — ABNORMAL LOW (ref 30.0–36.0)
MCV: 68.8 fL — ABNORMAL LOW (ref 80.0–100.0)
Platelets: 264 K/uL (ref 150–400)
RBC: 4.62 MIL/uL (ref 4.22–5.81)
RDW: 22.8 % — ABNORMAL HIGH (ref 11.5–15.5)
WBC: 6.6 K/uL (ref 4.0–10.5)
nRBC: 0 % (ref 0.0–0.2)

## 2023-09-21 LAB — PHOSPHORUS: Phosphorus: 2.9 mg/dL (ref 2.5–4.6)

## 2023-09-21 LAB — TRIGLYCERIDES: Triglycerides: 62 mg/dL (ref <150)

## 2023-09-21 LAB — MAGNESIUM: Magnesium: 2.1 mg/dL (ref 1.7–2.4)

## 2023-09-21 MED ORDER — ADULT MULTIVITAMIN W/MINERALS CH
1.0000 | ORAL_TABLET | Freq: Every day | ORAL | Status: DC
  Administered 2023-09-22: 1 via ORAL
  Filled 2023-09-21: qty 1

## 2023-09-21 MED ORDER — ENSURE PLUS HIGH PROTEIN PO LIQD
237.0000 mL | Freq: Three times a day (TID) | ORAL | Status: DC
  Administered 2023-09-21 – 2023-09-22 (×4): 237 mL via ORAL

## 2023-09-21 MED ORDER — TRAVASOL 10 % IV SOLN
INTRAVENOUS | Status: DC
  Filled 2023-09-21: qty 518.4

## 2023-09-21 NOTE — Progress Notes (Addendum)
 Progress Note    Erik Martinez  FMW:[id number] DOB: 1955/07/28  DOA: 09/13/2023 PCP: [name], MD      Brief Narrative:    Medical records reviewed and are as summarized below:  Erik Martinez is a 68 y.o. male with past medical history significant for chronic diastolic CHF, hepatic steatosis, HLD, DM2, GERD, peripheral neuropathy, ED who presented for colectomy for colon cancer. Hospitalist service for consulted for hypokalemia and medical management.         Assessment/Plan:   Principal Problem:   Hypokalemia Active Problems:   Colon cancer (HCC)   Protein-calorie malnutrition, severe   Nutrition Problem: Severe Malnutrition Etiology: acute illness  Signs/Symptoms: moderate fat depletion, moderate muscle depletion, percent weight loss Percent weight loss: 10 %   Body mass index is 26.18 kg/m.   Colon cancer: S/p robotic assisted laparoscopic right colectomy on 09/14/2023.  Diet has been advanced to soft diet.  He is still on TPN.  Follow-up with general surgeon for further recommendations.   Hyponatremia, hypokalemia: Improved   Iron  deficiency anemia: Likely due to colon cancer.  S/p transfusion 1 unit of PRBCs.   Type II DM with hyperglycemia: NovoLog  as needed for hyperglycemia.   Chronic diastolic CHF: Compensated.   He is on spironolactone .  Lasix  and metolazone  on hold. 2D echo on 08/24/2022 showed EF estimated at 50 to 55%, grade 1 diastolic dysfunction, mild to moderate MR.   Comorbidities include type II DM ( hemoglobin A1c 7.2 in July 2025), hyperlipidemia, peripheral neuropathy  Diet Order             Diet regular Room service appropriate? Yes; Fluid consistency: Thin  Diet effective now                                  Consultants: General Surgeon  Procedures: Robotic assisted laparoscopic right colectomy     Medications:    sodium chloride    Intravenous Once   atorvastatin   20 mg Oral QHS    Chlorhexidine  Gluconate Cloth  6 each Topical Daily   cyclobenzaprine   10 mg Oral TID   enoxaparin  (LOVENOX ) injection  40 mg Subcutaneous Q24H   feeding supplement  237 mL Oral TID BM   insulin  aspart  0-15 Units Subcutaneous Q6H   [START ON 09/22/2023] multivitamin with minerals  1 tablet Oral Daily   pantoprazole   40 mg Oral Daily   pregabalin   200 mg Oral TID   sodium chloride  flush  10-40 mL Intracatheter Q12H   sodium chloride  flush  3 mL Intravenous Q12H   spironolactone   25 mg Oral Daily   terbinafine   250 mg Oral Daily   thiamine  (VITAMIN B1) injection  100 mg Intravenous Daily   zolpidem   10 mg Oral QHS   Continuous Infusions:  TPN ADULT (ION) 85 mL/hr at 09/20/23 1827   TPN ADULT (ION)       Anti-infectives (From admission, onward)    Start     Dose/Rate Route Frequency Ordered Stop   09/14/23 1430  cefTRIAXone  (ROCEPHIN ) 2 g in sodium chloride  0.9 % 100 mL IVPB       Placed in And Linked Group   2 g 200 mL/hr over 30 Minutes Intravenous Daily 09/14/23 1347 09/18/23 1025   09/14/23 1430  metroNIDAZOLE  (FLAGYL ) IVPB 500 mg       Placed in And Linked Group   500 mg  100 mL/hr over 60 Minutes Intravenous 2 times daily 09/14/23 1347 09/18/23 2216   09/13/23 1630  terbinafine  (LAMISIL ) tablet 250 mg        250 mg Oral Daily 09/13/23 1532     09/13/23 0945  cefoTEtan  (CEFOTAN ) 2 g in sodium chloride  0.9 % 100 mL IVPB  Status:  Discontinued        2 g 200 mL/hr over 30 Minutes Intravenous On call to O.R. 09/13/23 0930 09/13/23 1526              Family Communication/Anticipated D/C date and plan/Code Status   DVT prophylaxis: enoxaparin  (LOVENOX ) injection 40 mg Start: 09/15/23 0800 SCD's Start: 09/14/23 1348 SCDs Start: 09/13/23 1237     Code Status: Full Code  Family Communication: None Disposition Plan: Plan to discharge home         Subjective:   No complaints.  He tolerated full liquid diet and has been started on soft diet.  He had a  bowel movement and he feels better.  He is hoping to be discharged home tomorrow.  Nurse tech and RN at the bedside  Objective:    Vitals:   09/20/23 2024 09/21/23 0301 09/21/23 0923 09/21/23 1512  BP: 112/73 111/60 116/76 112/65  Pulse: 89 78 85 87  Resp: 16 16 16 18   Temp: 98.1 F (36.7 C) 97.6 F (36.4 C) 98.7 F (37.1 C) 98.1 F (36.7 C)  TempSrc:    Oral  SpO2: 100% 99% 100% 99%  Weight:      Height:       No data found.   Intake/Output Summary (Last 24 hours) at 09/21/2023 1638 Last data filed at 09/21/2023 1621 Gross per 24 hour  Intake 2498.76 ml  Output 445 ml  Net 2053.76 ml   Filed Weights   09/13/23 0956 09/14/23 0809 09/20/23 0500  Weight: 76.2 kg 77.1 kg 80.4 kg    Exam:  GEN: NAD SKIN: Warm and dry EYES: No pallor or icterus ENT: MMM CV: RRR PULM: CTA B ABD: soft, ND, NT, +BS, + surgical incision with intact staples. CNS: AAO x 3, non focal EXT: No edema or tenderness      Data Reviewed:   I have personally reviewed following labs and imaging studies:  Labs: Labs show the following:   Basic Metabolic Panel: Recent Labs  Lab 09/17/23 0509 09/17/23 1058 09/18/23 0347 09/19/23 0356 09/20/23 0530 09/21/23 0534  NA  --  138 131* 134* 135 137  K  --  4.2 3.9 3.7 3.9 4.5  CL  --  101 98 101 102 103  CO2  --  28 25 24 27 25   GLUCOSE  --  114* 103* 91 137* 144*  BUN  --  14 13 11 13 15   CREATININE  --  0.91 0.90 0.85 0.85 0.63  CALCIUM   --  8.5* 7.6* 8.0* 8.1* 8.5*  MG 2.2  --  1.7 1.8 2.1 2.1  PHOS  --   --   --   --  2.9 2.9   GFR Estimated Creatinine Clearance: 90.8 mL/min (by C-G formula based on SCr of 0.63 mg/dL). Liver Function Tests: Recent Labs  Lab 09/20/23 0530 09/21/23 0534  AST 14* 16  ALT 8 7  ALKPHOS 47 46  BILITOT 0.6 0.5  PROT 5.9* 6.1*  ALBUMIN 2.3* 2.3*   No results for input(s): LIPASE, AMYLASE in the last 168 hours. No results for input(s): AMMONIA in the last 168 hours. Coagulation profile  No  results for input(s): INR, PROTIME in the last 168 hours.  CBC: Recent Labs  Lab 09/15/23 0444 09/16/23 0443 09/17/23 0509 09/18/23 0347 09/21/23 0534  WBC 9.8 8.3 7.8 8.3 6.6  HGB 9.0* 8.6* 8.6* 8.5* 9.5*  HCT 30.0* 29.5* 29.6* 29.5* 31.8*  MCV 68.8* 70.1* 70.6* 69.7* 68.8*  PLT 240 243 255 240 264   Cardiac Enzymes: No results for input(s): CKTOTAL, CKMB, CKMBINDEX, TROPONINI in the last 168 hours. BNP (last 3 results) Recent Labs    08/21/23 1016  PROBNP 1,787.0*   CBG: Recent Labs  Lab 09/20/23 1634 09/21/23 0039 09/21/23 0627 09/21/23 0801 09/21/23 1135  GLUCAP 182* 173* 150* 206* 268*   D-Dimer: No results for input(s): DDIMER in the last 72 hours. Hgb A1c: No results for input(s): HGBA1C in the last 72 hours. Lipid Profile: Recent Labs    09/21/23 0534  TRIG 62   Thyroid  function studies: No results for input(s): TSH, T4TOTAL, T3FREE, THYROIDAB in the last 72 hours.  Invalid input(s): FREET3 Anemia work up: No results for input(s): VITAMINB12, FOLATE, FERRITIN, TIBC, IRON , RETICCTPCT in the last 72 hours. Sepsis Labs: Recent Labs  Lab 09/16/23 0443 09/17/23 0509 09/18/23 0347 09/21/23 0534  WBC 8.3 7.8 8.3 6.6    Microbiology No results found for this or any previous visit (from the past 240 hours).  Procedures and diagnostic studies:  No results found.              LOS: 8 days   Linville Decarolis  [facility]   Pager on [email or web address]. If 7PM-7AM, please contact night-coverage at [email or web address]     09/21/2023, 4:38 PM

## 2023-09-21 NOTE — Progress Notes (Signed)
 [facility]- General Surgery  SURGICAL PROGRESS NOTE  Hospital Day(s): 8.   Post op day(s): 7 Days Post-Op.   Interval History:  Patient states he is doing great. Had a couple bowel movements this morning.  Still passing gas. He says he feels less distended. He has tolerated clear liquid diet, denies any nausea or vomiting. Does not have any complaints. 265 cc recorded output from JP drain in the last 24 hrs. The day before was 145 cc.   Vital signs in last 24 hours: [min-max] current  Temp:  [97.2 F (36.2 C)-98.7 F (37.1 C)] 98.7 F (37.1 C) (09/11 0923) Pulse Rate:  [78-89] 85 (09/11 0923) Resp:  [16-18] 16 (09/11 0923) BP: (111-142)/(60-79) 116/76 (09/11 0923) SpO2:  [94 %-100 %] 100 % (09/11 0923)     Height: 5' 9 (175.3 cm) Weight: 80.4 kg BMI (Calculated): 26.16   Intake/Output last 2 shifts:  09/10 0701 - 09/11 0700 In: 2018.8 [P.O.:1200; I.V.:818.8] Out: 265 [Drains:265]   Physical Exam:  Constitutional: alert, cooperative and no distress  Respiratory: breathing non-labored at rest  Cardiovascular: regular rate and sinus rhythm  Gastrointestinal: soft, non-tender, and non-distended, removed honeycomb dressings, incisions are clean and dry, and staples are intact.  Minimal serous output in JP drain.  Labs:     Latest Ref Rng & Units 09/21/2023    5:34 AM 09/18/2023    3:47 AM 09/17/2023    5:09 AM  CBC  WBC 4.0 - 10.5 K/uL 6.6  8.3  7.8   Hemoglobin 13.0 - 17.0 g/dL 9.5  8.5  8.6   Hematocrit 39.0 - 52.0 % 31.8  29.5  29.6   Platelets 150 - 400 K/uL 264  240  255       Latest Ref Rng & Units 09/21/2023    5:34 AM 09/20/2023    5:30 AM 09/19/2023    3:56 AM  CMP  Glucose 70 - 99 mg/dL 855  862  91   BUN 8 - 23 mg/dL 15  13  11    Creatinine 0.61 - 1.24 mg/dL 9.36  9.14  9.14   Sodium 135 - 145 mmol/L 137  135  134   Potassium 3.5 - 5.1 mmol/L 4.5  3.9  3.7   Chloride 98 - 111 mmol/L 103  102  101   CO2 22 - 32 mmol/L 25  27  24    Calcium  8.9 - 10.3 mg/dL  8.5  8.1  8.0   Total Protein 6.5 - 8.1 g/dL 6.1  5.9    Total Bilirubin 0.0 - 1.2 mg/dL 0.5  0.6    Alkaline Phos 38 - 126 U/L 46  47    AST 15 - 41 U/L 16  14    ALT 0 - 44 U/L 7  8      Imaging studies: No new pertinent imaging studies   Assessment/Plan:  68 y.o. male with invasive adenocarcinoma of right colon 7 Days Post-Op s/p robotic assisted laparoscopic right colectomy, complicated by pertinent comorbidities including HFpEF, type 2 diabetes mellitus,hepatitis B/C s/p treatment, hyperlipidemia, PAD, chronic pain syndrome, and GERD.    - Stable vital signs, no fever not tachycardic  - No leukocytosis  - Advance to full liquid diet. Patient is overall improving clinically with return of GI function. If tolerates full liquid diet for breakfast and lunch, will advance to regular diet for dinner. Possible discharge tomorrow if pain remains minimal.   - Will start tapering off TPN  - Will  continue to monitor output from JP drain  - Continue pain medication as needed  - Encouraged to continue ambulate  -- Tahj Njoku Barrientos PA-C

## 2023-09-21 NOTE — Progress Notes (Signed)
 Nutrition Follow Up Note   DOCUMENTATION CODES:   Severe malnutrition in context of acute illness/injury  INTERVENTION:   Ensure Plus High Protein po TID, each supplement provides 350 kcal and 20 grams of protein  MVI po daily   TPN per pharmacy- plan is for half rate tonight   Continue thiamine  100mg  daily x 5-7 days   Pt remains at high refeed risk; recommend monitor potassium, magnesium  and phosphorus labs daily until stable  Daily weights   NUTRITION DIAGNOSIS:   Severe Malnutrition related to acute illness as evidenced by moderate fat depletion, moderate muscle depletion, 10 percent weight loss in 1 month. -ongoing   GOAL:   Patient will meet greater than or equal to 90% of their needs -met   MONITOR:   PO intake, Supplement acceptance, Labs, Weight trends, Skin, I & O's, TPN  ASSESSMENT:   68 y/o male with h/o DM, GERD, chronic pain, anxiety, depression, bipolar disorder, PTSD, PAD, HLD, DDD, neurogenic bladeer, umbilical hernia, CHF, IDA, substance abuse and HCV who is admitted with colon cancer s/p robotic assisted laparoscopic right colectomy 9/4 complicated by post op ileus.  Pt reports feeling much better today. Pt is having bowel function. Distension improved. Pt tolerating his clear liquid diet and has been advanced to full liquids today. RD will add Ensure to help pt meet his estimated needs. Pt remains at refeed risk. Pt is tolerating TPN well; plan is to begin weaning tonight. Per chart, pt is up ~9lbs from admission.  Medications reviewed and include: lovenox , insulin , MVI, protonix , aldactone , thiamine , TPN   Labs reviewed: K 4.5 wnl, P 2.9 wnl, Mg 2.1 wnl Hgb 9.5(L), Hct 31.8(L), MCV 68.8(L), MCH 20.6(L), MCHC 29.9(L) Cbgs- 268, 206, 150, 173 x 24 hrs   Drains-   Diet Order:    Diet Order             Diet full liquid Room service appropriate? Yes; Fluid consistency: Thin  Diet effective now                  EDUCATION NEEDS:    Education needs have been addressed  Skin:  Skin Assessment: Reviewed RN Assessment (incision abdomen)  Last BM:  9/11- type 1  Height:   Ht Readings from Last 1 Encounters:  09/14/23 5' 9 (1.753 m)    Weight:   Wt Readings from Last 1 Encounters:  09/20/23 80.4 kg    Ideal Body Weight:  72.7 kg  BMI:  Body mass index is 26.18 kg/m.  Estimated Nutritional Needs:   Kcal:  2000-2300kcal/day  Protein:  100-115g/day  Fluid:  1.9-2.2L/day  Augustin Shams MS, RD, LDN If unable to be reached, please send secure chat to RD inpatient available from 8:00a-4:00p daily

## 2023-09-21 NOTE — Consult Note (Signed)
 PHARMACY - TOTAL PARENTERAL NUTRITION CONSULT NOTE   Indication: Prolonged ileus  Patient Measurements: Height: 5' 9 (175.3 cm) Weight: 80.4 kg (177 lb 4 oz) IBW/kg (Calculated) : 70.7 TPN AdjBW (KG): 77.1 Body mass index is 26.18 kg/m.   Assessment: 69yo male with PPMH including HLD, CHF, T2DM, GERD, peripheral neuropathy.  Patient is Post-Op day 5 for right colectomy secondary to colon cancer.  Patient has not had return of bowel function.  Pharmacy consulted for initiation of TPN due to prolonged ileus.   Glucose / Insulin : T2DM, A1C 7.2 in July 2025 mSSI q6h BG 137 - 182, 11u Novolog  in past 24 hours Electrolytes: WNL Renal: Scr < 1, stable Hepatic: WNL Intake / Output;  09/10 0701 - 09/11 0700 In: 2018.8 [P.O.:1200; I.V.:818.8] Out: 265 [Drains:265]  MIVF:  No current MIVF  Intake/Output Summary (Last 24 hours) at 09/21/2023 0705 Last data filed at 09/21/2023 0417 Gross per 24 hour  Intake 2018.76 ml  Output 230 ml  Net 1788.76 ml    GI Imaging: 9/8: Bowel-gas pattern compatible with postoperative ileus  GI Surgeries / Procedures:  9/4: Right colectomy  Central access: PICC line TPN start date: 09/19/23  Nutritional Goals: Goal TPN rate is 85 mL/hr (provides 110 g of protein and 2093 kcals per day)  RD Assessment: Estimated Needs Total Energy Estimated Needs: 2000-2300kcal/day Total Protein Estimated Needs: 100-115g/day Total Fluid Estimated Needs: 1.9-2.2L/day  Current Nutrition:  CLD  Plan:  --per surgery request will reduce TPN to 40 mL/hr (goal rate) at 1800 --Electrolytes in TPN: Na 109mEq/L, K 22mEq/L, Ca 51mEq/L, Mg 33mEq/L, and Phos 15mmol/L. Cl:Ac 1:1 --Add standard MVI and trace elements to TPN --continue Moderate q6h SSI and adjust as needed  --Monitor TPN labs on Mon/Thurs --Thiamine  100 mg IV daily x 7 days per dietary  Adriana JONETTA Bolster, PharmD 09/21/2023,7:05 AM

## 2023-09-21 NOTE — Anesthesia Postprocedure Evaluation (Signed)
 Anesthesia Post Note  Patient: Erik Martinez  Procedure(s) Performed: COLECTOMY, PARTIAL, ROBOT-ASSISTED, LAPAROSCOPIC (Right: Abdomen)  Patient location during evaluation: PACU Anesthesia Type: General Level of consciousness: awake and alert Pain management: pain level controlled Vital Signs Assessment: post-procedure vital signs reviewed and stable Respiratory status: spontaneous breathing, nonlabored ventilation, respiratory function stable and patient connected to nasal cannula oxygen Cardiovascular status: blood pressure returned to baseline and stable Postop Assessment: no apparent nausea or vomiting Anesthetic complications: no   No notable events documented.   Last Vitals:  Vitals:   09/21/23 0301 09/21/23 0923  BP: 111/60 116/76  Pulse: 78 85  Resp: 16 16  Temp: 36.4 C 37.1 C  SpO2: 99% 100%    Last Pain:  Vitals:   09/21/23 1123  TempSrc:   PainSc: 3                  Prentice Murphy

## 2023-09-22 ENCOUNTER — Ambulatory Visit: Admitting: Family

## 2023-09-22 DIAGNOSIS — E876 Hypokalemia: Secondary | ICD-10-CM | POA: Diagnosis not present

## 2023-09-22 LAB — GLUCOSE, CAPILLARY
Glucose-Capillary: 220 mg/dL — ABNORMAL HIGH (ref 70–99)
Glucose-Capillary: 159 mg/dL — ABNORMAL HIGH (ref 70–99)

## 2023-09-22 MED ORDER — ONDANSETRON 4 MG PO TBDP
4.0000 mg | ORAL_TABLET | Freq: Four times a day (QID) | ORAL | Status: DC | PRN
Start: 2023-09-22 — End: 2023-09-22

## 2023-09-22 MED ORDER — ONDANSETRON HCL 4 MG/2ML IJ SOLN: 4.0000 mg | Freq: Four times a day (QID) | INTRAMUSCULAR | Status: DC | PRN

## 2023-09-22 MED ORDER — ACETAMINOPHEN 325 MG PO TABS: 650.0000 mg | ORAL_TABLET | Freq: Three times a day (TID) | ORAL | Status: DC | PRN

## 2023-09-22 MED ORDER — POTASSIUM CHLORIDE CRYS ER 20 MEQ PO TBCR
40.0000 meq | EXTENDED_RELEASE_TABLET | ORAL | Status: DC
  Administered 2023-09-22: 40 meq via ORAL
  Filled 2023-09-22: qty 2

## 2023-09-22 NOTE — Progress Notes (Signed)
 Discharge instructions were reviewed with patient. Questions were encouraged and answered. Picc line removed and intact. Teaching for JP drain was done and supplies were provided. Belongings collected by patient.

## 2023-09-22 NOTE — Progress Notes (Signed)
 Progress Note    Erik Martinez  FMW:[id number] DOB: 11/30/1955  DOA: 09/13/2023 PCP: [name], MD      Brief Narrative:    Medical records reviewed and are as summarized below:  Erik Martinez is a 68 y.o. male with past medical history significant for chronic diastolic CHF, hepatic steatosis, HLD, DM2, GERD, peripheral neuropathy, ED who presented for colectomy for colon cancer. Hospitalist service for consulted for hypokalemia and medical management.         Assessment/Plan:   Principal Problem:   Hypokalemia Active Problems:   Colon cancer (HCC)   Protein-calorie malnutrition, severe   Nutrition Problem: Severe Malnutrition Etiology: acute illness  Signs/Symptoms: moderate fat depletion, moderate muscle depletion, percent weight loss Percent weight loss: 10 %   Body mass index is 24.14 kg/m.   Colon cancer: S/p robotic assisted laparoscopic right colectomy on 09/14/2023.  He has been tolerating his diet.  General surgeon is going to discharge patient home today.   Hyponatremia, hypokalemia: Improved   Iron  deficiency anemia: Likely due to colon cancer.  S/p transfusion 1 unit of PRBCs.   Type II DM with hyperglycemia: Resume semaglutide , Jardiance  and NovoLog  at discharge    Chronic diastolic CHF: Compensated.   Continue spironolactone , Lasix  and metolazone  at discharge 2D echo on 08/24/2022 showed EF estimated at 50 to 55%, grade 1 diastolic dysfunction, mild to moderate MR.   Comorbidities include type II DM ( hemoglobin A1c 7.2 in July 2025), hyperlipidemia, peripheral neuropathy  Diet Order             Diet - low sodium heart healthy                                  Consultants: General Surgeon  Procedures: Robotic assisted laparoscopic right colectomy     Medications:     Continuous Infusions:     Anti-infectives (From admission, onward)    Start     Dose/Rate Route Frequency Ordered Stop    09/14/23 1430  cefTRIAXone  (ROCEPHIN ) 2 g in sodium chloride  0.9 % 100 mL IVPB       Placed in And Linked Group   2 g 200 mL/hr over 30 Minutes Intravenous Daily 09/14/23 1347 09/18/23 1025   09/14/23 1430  metroNIDAZOLE  (FLAGYL ) IVPB 500 mg       Placed in And Linked Group   500 mg 100 mL/hr over 60 Minutes Intravenous 2 times daily 09/14/23 1347 09/18/23 2216   09/13/23 1630  terbinafine  (LAMISIL ) tablet 250 mg  Status:  Discontinued        250 mg Oral Daily 09/13/23 1532 09/22/23 1658   09/13/23 0945  cefoTEtan  (CEFOTAN ) 2 g in sodium chloride  0.9 % 100 mL IVPB  Status:  Discontinued        2 g 200 mL/hr over 30 Minutes Intravenous On call to O.R. 09/13/23 0930 09/13/23 1526              Family Communication/Anticipated D/C date and plan/Code Status   DVT prophylaxis:      Code Status: Prior  Family Communication: None Disposition Plan: Plan to discharge home         Subjective:   Interval events noted.  He has no complaints.  He feels better and ready to go home today.  He has been ambulating around the nurses' station.  Objective:    Vitals:  09/21/23 2150 09/22/23 0129 09/22/23 0401 09/22/23 0829  BP: (!) 114/49 106/60 113/65 110/62  Pulse: 92 91 95 87  Resp: 16  18 18   Temp: 98.5 F (36.9 C)  97.8 F (36.6 C) 98 F (36.7 C)  TempSrc:      SpO2: 95% 99% 99% 100%  Weight:      Height:       No data found.   Intake/Output Summary (Last 24 hours) at 09/22/2023 2011 Last data filed at 09/22/2023 0700 Gross per 24 hour  Intake 1238.61 ml  Output 150 ml  Net 1088.61 ml   Filed Weights   09/14/23 0809 09/20/23 0500 09/21/23 1654  Weight: 77.1 kg 80.4 kg 74.2 kg    Exam:  GEN: NAD SKIN: Warm and dry EYES: Anicteric ENT: MMM CV: RRR PULM: CTA B ABD: soft, ND, NT, +BS, surgical incision is clean, dry and intact  CNS: AAO x 3, non focal EXT: No edema or tenderness       Data Reviewed:   I have personally reviewed following  labs and imaging studies:  Labs: Labs show the following:   Basic Metabolic Panel: Recent Labs  Lab 09/17/23 0509 09/17/23 1058 09/18/23 0347 09/19/23 0356 09/20/23 0530 09/21/23 0534  NA  --  138 131* 134* 135 137  K  --  4.2 3.9 3.7 3.9 4.5  CL  --  101 98 101 102 103  CO2  --  28 25 24 27 25   GLUCOSE  --  114* 103* 91 137* 144*  BUN  --  14 13 11 13 15   CREATININE  --  0.91 0.90 0.85 0.85 0.63  CALCIUM   --  8.5* 7.6* 8.0* 8.1* 8.5*  MG 2.2  --  1.7 1.8 2.1 2.1  PHOS  --   --   --   --  2.9 2.9   GFR Estimated Creatinine Clearance: 90.8 mL/min (by C-G formula based on SCr of 0.63 mg/dL). Liver Function Tests: Recent Labs  Lab 09/20/23 0530 09/21/23 0534  AST 14* 16  ALT 8 7  ALKPHOS 47 46  BILITOT 0.6 0.5  PROT 5.9* 6.1*  ALBUMIN 2.3* 2.3*   No results for input(s): LIPASE, AMYLASE in the last 168 hours. No results for input(s): AMMONIA in the last 168 hours. Coagulation profile No results for input(s): INR, PROTIME in the last 168 hours.  CBC: Recent Labs  Lab 09/16/23 0443 09/17/23 0509 09/18/23 0347 09/21/23 0534  WBC 8.3 7.8 8.3 6.6  HGB 8.6* 8.6* 8.5* 9.5*  HCT 29.5* 29.6* 29.5* 31.8*  MCV 70.1* 70.6* 69.7* 68.8*  PLT 243 255 240 264   Cardiac Enzymes: No results for input(s): CKTOTAL, CKMB, CKMBINDEX, TROPONINI in the last 168 hours. BNP (last 3 results) Recent Labs    08/21/23 1016  PROBNP 1,787.0*   CBG: Recent Labs  Lab 09/21/23 1135 09/21/23 1709 09/21/23 2353 09/22/23 0539 09/22/23 0825  GLUCAP 268* 215* 155* 220* 159*   D-Dimer: No results for input(s): DDIMER in the last 72 hours. Hgb A1c: No results for input(s): HGBA1C in the last 72 hours. Lipid Profile: Recent Labs    09/21/23 0534  TRIG 62   Thyroid  function studies: No results for input(s): TSH, T4TOTAL, T3FREE, THYROIDAB in the last 72 hours.  Invalid input(s): FREET3 Anemia work up: No results for input(s): VITAMINB12,  FOLATE, FERRITIN, TIBC, IRON , RETICCTPCT in the last 72 hours. Sepsis Labs: Recent Labs  Lab 09/16/23 9556 09/17/23 9490 09/18/23 0347 09/21/23 0534  WBC 8.3 7.8 8.3 6.6    Microbiology No results found for this or any previous visit (from the past 240 hours).  Procedures and diagnostic studies:  No results found.              LOS: 9 days   Sherissa Tenenbaum  [employer] loss adjuster on [email or web address]. If 7PM-7AM, please contact night-coverage at [email or web address]     09/22/2023, 8:11 PM
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
## 2023-09-22 NOTE — Discharge Instructions (Addendum)
  Diet: Resume home heart healthy regular diet.   Activity: No heavy lifting >20 pounds (children, pets, laundry, garbage) or strenuous activity until follow-up, but light activity and walking are encouraged. Do not drive or drink alcohol if taking narcotic pain medications.  Wound care: May shower with soapy water  and pat dry (do not rub incisions), but no baths or submerging incision underwater until follow-up. (no swimming) Empty JP drain and record output daily. Change dressing at the site of JP drain with gauze and paper tape.   Medications: Resume all home medications. For mild to moderate pain: acetaminophen  (Tylenol ) or ibuprofen (if no kidney disease). Combining Tylenol  with alcohol can substantially increase your risk of causing liver disease. Narcotic pain medications, if prescribed, can be used for severe pain, though may cause nausea, constipation, and drowsiness. Can take Oxycodone  from home as needed. Do not recommend driving when taking narcotics.   Call office [phone]) at any time if any questions, worsening pain, fevers/chills, bleeding, drainage from incision site, or other concerns.

## 2023-09-23 ENCOUNTER — Other Ambulatory Visit: Payer: Self-pay | Admitting: Family Medicine

## 2023-09-23 DIAGNOSIS — G4700 Insomnia, unspecified: Secondary | ICD-10-CM

## 2023-09-25 ENCOUNTER — Telehealth: Payer: Self-pay

## 2023-09-25 NOTE — Telephone Encounter (Signed)
 Name of Medication:  Ambien  Name of Pharmacy:  [facility] Last Fill or Written Date and Quantity:  08/18/23, #30 Last Office Visit and Type:  09/01/23, HFU Next Office Visit and Type:  none Last Controlled Substance Agreement Date:  none Last UDS:  none

## 2023-09-25 NOTE — Telephone Encounter (Signed)
 Received fax from [facility] from Dr. Estelita office in regards to referral received stating the following please schedule with Dr. Melanee at [facility] for colon cancer.  Case already discussed with Dr. Melanee.  Wanted to verify if patient has been set up with an appointment.  Outbound call spoke to [facility] and confirmed patient has an appointment tomorrow 09/26/23 at 8:45am with Dr. Melanee

## 2023-09-25 NOTE — Transitions of Care (Post Inpatient/ED Visit) (Signed)
   09/25/2023  Name: Erik Martinez MRN: [id number] DOB: 08-25-1955  Today's TOC FU Call Status: Today's TOC FU Call Status:: Unsuccessful Call (1st Attempt) Unsuccessful Call (1st Attempt) Date: 09/25/23  Attempted to reach the patient regarding the most recent Inpatient/ED visit.  Follow Up Plan: Additional outreach attempts will be made to reach the patient to complete the Transitions of Care (Post Inpatient/ED visit) call.   Arvin Seip RN, BSN, CCM [employer], Population Health Case Manager Phone: [phone]

## 2023-09-25 NOTE — Transitions of Care (Post Inpatient/ED Visit) (Signed)
 09/25/2023  Name: Erik Martinez MRN: [id number] DOB: 06/06/1955  Today's TOC FU Call Status: Today's TOC FU Call Status:: Successful TOC FU Call Completed TOC FU Call Complete Date: 09/25/23 Patient's Name and Date of Birth confirmed.  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 Patient Questions/Concerns Addressed: Notified Provider of Patient Questions/Concerns  Items Reviewed: Did you receive and understand the discharge instructions provided?: Yes Medications obtained,verified, and reconciled?: Yes (Medications Reviewed) Any new allergies since your discharge?: No Dietary orders reviewed?: Yes Type of Diet Ordered:: low salt heart healthy Do you have support at home?: Yes People in Home [RPT]: friend(s) Name of Support/Comfort Primary Source: Ronnie STallings  Medications Reviewed Today: Medications Reviewed Today     Reviewed by Siris Hoos E, RN (Registered Nurse) on 09/25/23 at 1208  Med List Status: <None>   Medication Order Taking? Sig Documenting Provider Last Dose Status Informant  atorvastatin  (LIPITOR) 20 MG tablet [id number] Yes Take 20 mg by mouth at bedtime. [provider]  Active Self, Pharmacy Records  celecoxib  (CELEBREX ) 200 MG capsule [id number] Yes Take 1 capsule (200 mg total) by mouth 2 (two) times daily. [name], MD  Active Self, Pharmacy Records  Continuous Glucose Sensor (FREESTYLE LIBRE 3 PLUS SENSOR) [place] [id number] Yes CHANCE  SENSOR EVERY 15 DAYS Bedsole, Amy E, MD  Active Self, Pharmacy Records  cyclobenzaprine  (FLEXERIL ) 10 MG tablet [id number] Yes Take 10 mg by mouth 3 (three) times daily. [provider]  Active Self, Pharmacy Records  empagliflozin  (JARDIANCE ) 25 MG TABS tablet [id number] Yes Take 1 tablet (25 mg total) by mouth daily. [name], Rosaline GORMAN, FNP  Active Self, Pharmacy Records  furosemide  (LASIX ) 20 MG tablet [id number] Yes Take 3 tablets (60 mg total) by mouth daily. Donette Ellouise LABOR, FNP  Active Self, Pharmacy Records  insulin  aspart (NOVOLOG ) 100 UNIT/ML injection [id number] Yes Per sliding scale  MAX 15 units in 24 hour period. [name], MD  Active Self, Pharmacy Records  iron  polysaccharides (NIFEREX) 150 MG capsule [id number] Yes Take 1 capsule (150 mg total) by mouth daily. Barbarann Nest, MD  Active Self, Pharmacy Records  lidocaine  (LIDODERM ) 5 % [id number] Yes Place 3 patches onto the skin daily. Remove & Discard patch within 12 hours or as directed by MD [name], MD  Active Self, Pharmacy Records  lubiprostone  (AMITIZA ) 8 MCG capsule [id number] Yes Take 1 capsule (8 mcg total) by mouth 3 (three) times daily. [name], MD  Active   metolazone  (ZAROXOLYN ) 2.5 MG tablet [id number] Yes Take Metolazone  2.5 MG on Tuesday, Wednesday, Thursday 1/2 hour before you take your Lasix . Donette Ellouise LABOR, FNP  Active Self, Pharmacy Records  Oxycodone  HCl 20 MG TABS [id number] Yes Take 1 tablet by mouth every 4 (four) hours as needed (Pain). [provider]  Active Self, Pharmacy Records  pantoprazole  (PROTONIX ) 40 MG tablet [id number] Yes Take 1 tablet (40 mg total) by mouth daily. Schnier, Cordella MATSU, MD  Active Self, Pharmacy Records  polyethylene glycol (MIRALAX  / GLYCOLAX ) 17 g packet [id number]  Take 17 g by mouth daily as needed  for mild constipation. Barbarann Nest, MD  Active Self, Pharmacy Records  pregabalin  (LYRICA ) 200 MG capsule [id number] Yes Take 1 capsule (200 mg total)  by mouth in the morning, at noon, and at bedtime. [name], MD  Active Self, Pharmacy Records  Semaglutide , 2 MG/DOSE, (OZEMPIC , 2 MG/DOSE,) 8 MG/3ML SOPN [id number] Yes Inject 2 mg into the skin once a week. Monday [name], MD  Active Self, Pharmacy Records  spironolactone  (ALDACTONE ) 25 MG tablet [id number] Yes Take 1 tablet (25 mg total) by mouth daily. Donette Ellouise LABOR, FNP  Active Self, Pharmacy Records  tadalafil (CIALIS) 20 MG tablet [id number] Yes Take 20 mg by mouth daily as needed for erectile dysfunction. [provider]  Active Self, Pharmacy Records  terbinafine  (LAMISIL ) 250 MG tablet [id number] Yes Take 1 tablet (250 mg total) by mouth daily. Janit Thresa HERO, DPM  Active Self, Pharmacy Records  zolpidem  (AMBIEN ) 10 MG tablet [id number] Yes Take 1 tablet (10 mg total) by mouth at bedtime as needed. for sleep [name], MD  Active Self, Pharmacy Records            Home Care and Equipment/Supplies: Were Home Health Services Ordered?: No Any new equipment or medical supplies ordered?: No  Functional Questionnaire: Do you need assistance with bathing/showering or dressing?: No Do you need assistance with meal preparation?: No Do you need assistance with eating?: No Do you have difficulty maintaining continence: No Do you need assistance with getting out of bed/getting out of a chair/moving?: No Do you have difficulty managing or taking your medications?: No  Follow up appointments reviewed: PCP Follow-up appointment confirmed?: Yes Date of PCP follow-up appointment?: 10/03/23 Follow-up Provider: Dr. Greig Avelina Specialist [facility] Follow-up appointment confirmed?: Yes Date of Specialist follow-up appointment?: 09/27/23 Follow-Up Specialty Provider:: Dr. Henriette Pierre Do you need transportation to your follow-up appointment?: No Do you understand care options if your condition(s) worsen?: Yes-patient verbalized understanding  SDOH Interventions Today     Flowsheet Row Most Recent Value  SDOH Interventions   Food Insecurity Interventions Intervention Not Indicated  Housing Interventions Intervention Not Indicated  Transportation Interventions Intervention Not Indicated  Utilities Interventions Intervention Not Indicated   Discussed and offered 30 day TOC program.  Patient  declined.  The patient has been provided with contact information for the care management team and has been advised to call with any health -related questions or concerns.  The patient verbalized understanding with current plan of care.  The patient is directed to their insurance card regarding availability of benefits coverage.    Arvin Seip RN, BSN, CCM [employer], Population Health Case Manager Phone: [phone]

## 2023-09-25 NOTE — Patient Instructions (Signed)
 Visit Information  Thank you for taking time to visit with me today. Please don't hesitate to contact me if I can be of assistance to you   Patient instructions: Follow provider plan regarding incision care and contact provider for any signs of infection at site or questions/ concerns Take medications as prescribed Keep follow up appointments with providers Drink plenty of fluids Activity as per surgeon's advisement   Patient verbalizes understanding of instructions and care plan provided today and agrees to view in MyChart. Active MyChart status and patient understanding of how to access instructions and care plan via MyChart confirmed with patient.     The patient has been provided with contact information for the care management team and has been advised to call with any health related questions or concerns.   Please call the care guide team at [phone] if you need to cancel or reschedule your appointment.   Please call the Suicide and Crisis Lifeline: 988 call the [place]  National Suicide Prevention Lifeline: (236)171-1980 or TTY: 928-257-3297 TTY (319)375-4425) to talk to a trained counselor call 1-800-273-TALK (toll free, 24 hour hotline) go to [facility] [address] [place] [phone]) if you are experiencing a Mental Health or Behavioral Health Crisis or need someone to talk to.  Arvin Seip RN, BSN, CCM [employer], Population Health Case Manager Phone: [phone]

## 2023-09-26 ENCOUNTER — Inpatient Hospital Stay: Attending: Oncology | Admitting: Oncology

## 2023-09-26 ENCOUNTER — Ambulatory Visit: Admitting: Oncology

## 2023-09-26 ENCOUNTER — Encounter: Payer: Self-pay | Admitting: Oncology

## 2023-09-26 ENCOUNTER — Inpatient Hospital Stay

## 2023-09-26 VITALS — BP 126/69 | HR 88 | Temp 97.9°F | Resp 20 | Ht 69.0 in | Wt 163.8 lb

## 2023-09-26 DIAGNOSIS — C189 Malignant neoplasm of colon, unspecified: Secondary | ICD-10-CM | POA: Diagnosis not present

## 2023-09-26 DIAGNOSIS — C184 Malignant neoplasm of transverse colon: Secondary | ICD-10-CM | POA: Diagnosis not present

## 2023-09-26 LAB — GENETIC SCREENING ORDER

## 2023-09-26 NOTE — Progress Notes (Signed)
 Hematology/Oncology Consult note [facility]  Telephone:([phone] [phone]  Patient Care Team: [name], MD as PCP - General (Family Medicine) Fate Morna SAILOR, [facility] (Inactive) as Pharmacist (Pharmacist) Maurie Rayfield BIRCH, RN as Oncology Nurse Navigator   Name of the patient: Erik Martinez  [id number]  1956/01/02   Date of visit: 09/26/23  Diagnosis-new diagnosis of stage II colon cancer  Chief complaint/ Reason for visit-discuss final pathology results and further management  Heme/Onc history: Patient is a 68 year old male who underwent colonoscopy on 08/25/2023 which showed a partially obstructing mass in the transverse colon.  This was biopsied and consistent with colon adenocarcinoma.  Upper endoscopy was normal.  Patient underwent CT chest abdomen and pelvis with contrast on 08/25/2023 which showed circumferential luminal narrowing of the colon at the hepatic flexure consistent with primary colonic neoplasm.  No evidence of metastatic adenopathy in the abdomen and pelvis.  No evidence of liver metastases.  No evidence of intrathoracic metastases.  Small nodules around the oblique seizures favored to be benign.Preoperative CEA was mildly elevated at 6.6.  Patient underwent right hemicolectomy on 09/14/2023 which showed invasive adenocarcinoma moderately differentiated 4 cm focally invading visceral peritoneum.  Margins negative.  23 examined lymph nodes negative.  No evidence of lymphovascular invasion.  Tumor budding score moderate at 5.  pT4 N0. MSI stable.  Interval history-patient still has surgical staples in place from his laparoscopic hemicolectomy.  He is gradually healing well.  No family history of colon cancer or breast cancer or melanoma.  History of lung cancer in his parents  ECOG PS- 1 Pain scale- 3   Review of systems- Review of Systems  Constitutional:  Negative for chills, fever, malaise/fatigue and weight loss.  HENT:   Negative for congestion, ear discharge and nosebleeds.   Eyes:  Negative for blurred vision.  Respiratory:  Negative for cough, hemoptysis, sputum production, shortness of breath and wheezing.   Cardiovascular:  Negative for chest pain, palpitations, orthopnea and claudication.  Gastrointestinal:  Negative for abdominal pain, blood in stool, constipation, diarrhea, heartburn, melena, nausea and vomiting.  Genitourinary:  Negative for dysuria, flank pain, frequency, hematuria and urgency.  Musculoskeletal:  Negative for back pain, joint pain and myalgias.  Skin:  Negative for rash.  Neurological:  Negative for dizziness, tingling, focal weakness, seizures, weakness and headaches.  Endo/Heme/Allergies:  Does not bruise/bleed easily.  Psychiatric/Behavioral:  Negative for depression and suicidal ideas. The patient does not have insomnia.       Allergies  Allergen Reactions   Suboxone [Buprenorphine Hcl-Naloxone  Hcl] Nausea And Vomiting   Tape Other (See Comments)    Whelps *Paper Tape is ok    Codeine Rash, Itching and Nausea Only   Morphine  And Codeine Itching and Rash   Other Rash    Telemetry electrodes   Silicone Rash    Whelps -*Paper Tape is ok      Past Medical History:  Diagnosis Date   (HFpEF) heart failure with preserved ejection fraction (HCC)    Adenocarcinoma, colon (HCC) 08/25/2023   Anemia    Anxiety    Aortic atherosclerosis (HCC)    Arthritis    Avascular necrosis of bones of both hips (HCC)    Bipolar disorder (HCC)    Bladder spasms    Chronic pain of left knee    Chronic pain syndrome    a.) followed by pain clinic in [place]   Chronic, continuous use of opioids    a.) managed  by pain clinic in [place]   DDD (degenerative disc disease)    Depression    Erectile dysfunction    a.) on PDE5i (tadalafil)   Frequency-urgency syndrome    GERD (gastroesophageal reflux disease)    Hepatitis C 2018   History of anesthesia complications    a.) PONV; b.) PDPH; c.)  difficult to sedate   History of hepatitis B    1983  TX'D--  NO ISSUES OR SYMPTOMS SINCE   Hyperlipidemia    Hypotestosteronemia    Insomnia    a.) on hypnotic PRN (zolpidem )   Multiple lung nodules on CT    a.) CT CAP 08/25/2023: along oblique fissures - favored to be benign   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   Neurogenic bladder    Nocturia    PAD (peripheral artery disease) (HCC)    Polysubstance abuse (HCC)    a.) cocaine + marijuana + BZO + opioids   PONV (postoperative nausea and vomiting)    Spinal headache    T2DM (type 2 diabetes mellitus) (HCC)    Urine incontinence      Past Surgical History:  Procedure Laterality Date   APPLICATION OF WOUND VAC Left 09/11/2021   Procedure: APPLICATION OF WOUND VAC;  Surgeon: [name], MD;  Location: [facility] ORS;  Service: Orthopedics;  Laterality: Left;  [id number]    BACK SURGERY     2 rods and 4 screws artificial disc   COLONOSCOPY  2008   COLONOSCOPY N/A 08/25/2023   Procedure: COLONOSCOPY;  Surgeon: [name], MD;  Location: [facility] ENDOSCOPY;  Service: Endoscopy;  Laterality: N/A;   ESOPHAGOGASTRODUODENOSCOPY N/A 08/25/2023   Procedure: EGD (ESOPHAGOGASTRODUODENOSCOPY);  Surgeon: [name], MD;  Location: [facility] ENDOSCOPY;  Service: Endoscopy;  Laterality: N/A;   INTERSTIM IMPLANT PLACEMENT  2007   INTERSTIM IMPLANT REVISION N/A 08/17/2012   Procedure: REPLACMENT OF IPG PLUS REPLACE LEAD OF INTERSTIM IMPLANT ;  Surgeon: [name], MD;  Location[facility];  Service: Urology;  Laterality: N/A;   IRRIGATION AND DEBRIDEMENT KNEE Left 09/11/2021   Procedure: IRRIGATION AND DEBRIDEMENT WITH POLY EXCHANGE LEFT KNEE;  Surgeon: [name], MD;  Location: [facility] ORS;  Service: Orthopedics;  Laterality: Left;   IRRIGATION AND DEBRIDEMENT KNEE Left 06/01/2022   Procedure: IRRIGATION AND DEBRIDEMENT KNEE;  Surgeon: [name], MD;  Location: [facility];  Service: Orthopedics;  Laterality: Left;    LOWER EXTREMITY ANGIOGRAPHY Left 11/17/2020   Procedure: LOWER EXTREMITY ANGIOGRAPHY;  Surgeon: [name], MD;  Location: [facility] INVASIVE CV LAB;  Service: Cardiovascular;  Laterality: Left;   LOWER EXTREMITY ANGIOGRAPHY Right 12/22/2020   Procedure: LOWER EXTREMITY ANGIOGRAPHY;  Surgeon: [name], MD;  Location: [facility];  Service: Cardiovascular;  Laterality: Right;   LOWER EXTREMITY ANGIOGRAPHY Left 12/14/2021   Procedure: Lower Extremity Angiography;  Surgeon: [name], MD;  Location: [facility] INV[facility];  Service: Cardiovascular;  Laterality: Left;   LUMBAR DISC SURGERY  2005   L5   LUMBAR FUSION  X2  2006  &  2007   MANIPULATION KNEE JOINT Left 08/2002   OPEN DEBRIDEMENT LEFT TOTAL KNEE (SCAR, BONEY GRAOWTH)/ REMOVAL OLD SUTURES  05/01/2007   PARTIAL HIP ARTHROPLASTY Left 2001   x2   SHOULDER OPEN ROTATOR CUFF REPAIR Left 2000   TOTAL HIP ARTHROPLASTY Left 2002   TOTAL KNEE ARTHROPLASTY Left 06/2002   PARTIAL LEFT KNEE REPLACEMENT PRIOR TO THIS   TOTAL KNEE REVISION Left 2008  TOTAL KNEE REVISION Left 07/19/2021   Procedure: TOTAL KNEE REVISION;  Surgeon: [name], MD;  Location: [facility];  Service: Orthopedics;  Laterality: Left;   TOTAL KNEE REVISION Left 2005   TOTAL KNEE REVISION Left 06/01/2022   Procedure: TOTAL KNEE REVISION;  Surgeon: [name], MD;  Location: [facility];  Service: Orthopedics;  Laterality: Left;   TOTAL KNEE REVISION Left 04/01/2022   Procedure: Removal of left total knee implants with insertion of spacer.;  Surgeon: [name], MD;  Location: [facility] OR[facility];  Service: Orthopedics;  Laterality: Left;   TRIGGER FINGER RELEASE Bilateral    SEVERAL FINGERS   TRIGGER FINGER RELEASE Right 06/18/2014   Procedure: RELEASE TRIGGER FINGER/A-1 PULLEY;  Surgeon: Lynwood SQUIBB Mardee, MD;  Location: [facility];  Service: Orthopedics;  Laterality: Right;    Social History   Socioeconomic History   Marital status: Divorced     Spouse name: Not on file   Number of children: 3   Years of education: Not on file   Highest education level: Some college, no degree  Occupational History   Not on file  Tobacco Use   Smoking status: Never   Smokeless tobacco: Never  Vaping Use   Vaping status: Never Used  Substance and Sexual Activity   Alcohol use: Not Currently    Alcohol/week: 0.0 - 2.0 standard drinks of alcohol   Drug use: Not Currently    Types: Cocaine    Comment: last + cocaine UDS 07/2016   Sexual activity: Not Currently    Birth control/protection: None  Other Topics Concern   Not on file  Social History Narrative    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   Overall Financial Resource Strain (CARDIA)    Difficulty of Paying Living Expenses: Not hard at all  Food Insecurity: No Food Insecurity (09/26/2023)   Hunger Vital Sign    Worried About Running Out of Food in the Last Year: Never true    Ran Out of Food in the Last Year: Never true  Recent Concern: Food Insecurity - Food Insecurity Present (07/11/2023)   Received from [facility]   Hunger Vital Sign    Within the past 12 months, you worried that your food would run out before you got the money to buy more.: Sometimes true    Within the past 12 months, the food you bought just didn't last and you didn't have money to get more.: Never true  Transportation Needs: No Transportation Needs (09/26/2023)   PRAPARE - Administrator, Civil Service (Medical): No    Lack of Transportation (Non-Medical): No  Physical Activity: Inactive (01/28/2022)   Exercise Vital Sign    Days of Exercise per Week: 0 days    Minutes of Exercise per Session: 0 min  Stress: No Stress Concern Present (01/28/2022)   [employer] of Occupational Health - Occupational Stress Questionnaire    Feeling of Stress : Not at all   Social Connections: Moderately Isolated (09/13/2023)   Social Connection and Isolation Panel    Frequency of Communication with Friends and Family: More than three times a week    Frequency of Social Gatherings with Friends and Family: More than three times a week    Attends Religious Services: More than 4 times per year    Active Member of [employer]  or Organizations: No    Attends Banker Meetings: Never    Marital Status: Divorced  Catering manager Violence: Not At Risk (09/26/2023)   Humiliation, Afraid, Rape, and Kick questionnaire    Fear of Current or Ex-Partner: No    Emotionally Abused: No    Physically Abused: No    Sexually Abused: No    Family History  Problem Relation Age of Onset   Diabetes Mother    Arthritis Mother    Cancer Mother        lung cancer   Hyperlipidemia Mother    Stroke Mother    Alcohol abuse Father    Cancer Father        lung   Arthritis Father    Diabetes Brother      Current Outpatient Medications:    atorvastatin  (LIPITOR) 20 MG tablet, Take 20 mg by mouth at bedtime., Disp: , Rfl:    celecoxib  (CELEBREX ) 200 MG capsule, Take 1 capsule (200 mg total) by mouth 2 (two) times daily., Disp: , Rfl:    Continuous Glucose Sensor (FREESTYLE LIBRE 3 PLUS SENSOR) MISC, CHANCE SENSOR EVERY 15 DAYS, Disp: 2 each, Rfl: 3   cyclobenzaprine  (FLEXERIL ) 10 MG tablet, Take 10 mg by mouth 3 (three) times daily., Disp: , Rfl:    empagliflozin  (JARDIANCE ) 25 MG TABS tablet, Take 1 tablet (25 mg total) by mouth daily., Disp: 90 tablet, Rfl: 1   furosemide  (LASIX ) 20 MG tablet, Take 3 tablets (60 mg total) by mouth daily., Disp: 90 tablet, Rfl: 5   insulin  aspart (NOVOLOG ) 100 UNIT/ML injection, Per sliding scale  MAX 15 units in 24 hour period., Disp: 10 mL, Rfl: PRN   iron  polysaccharides (NIFEREX) 150 MG capsule, Take 1 capsule (150 mg total) by mouth daily., Disp: 30 capsule, Rfl: 0   lidocaine  (LIDODERM ) 5 %, Place 3 patches onto the skin daily.  Remove & Discard patch within 12 hours or as directed by MD, Disp: 90 patch, Rfl: 0   lubiprostone  (AMITIZA ) 8 MCG capsule, Take 1 capsule (8 mcg total) by mouth 3 (three) times daily., Disp: 90 capsule, Rfl: 1   metolazone  (ZAROXOLYN ) 2.5 MG tablet, Take Metolazone  2.5 MG on Tuesday, Wednesday, Thursday 1/2 hour before you take your Lasix ., Disp: 3 tablet, Rfl: 1   Oxycodone  HCl 20 MG TABS, Take 1 tablet by mouth every 4 (four) hours as needed (Pain)., Disp: , Rfl:    pantoprazole  (PROTONIX ) 40 MG tablet, Take 1 tablet (40 mg total) by mouth daily., Disp: 90 tablet, Rfl: 3   polyethylene glycol (MIRALAX  / GLYCOLAX ) 17 g packet, Take 17 g by mouth daily as needed for mild constipation., Disp: 14 each, Rfl: 0   pregabalin  (LYRICA ) 200 MG capsule, Take 1 capsule (200 mg total) by mouth in the morning, at noon, and at bedtime., Disp: 90 capsule, Rfl: 0   Semaglutide , 2 MG/DOSE, (OZEMPIC , 2 MG/DOSE,) 8 MG/3ML SOPN, Inject 2 mg into the skin once a week. Monday, Disp: , Rfl:    spironolactone  (ALDACTONE ) 25 MG tablet, Take 1 tablet (25 mg total) by mouth daily., Disp: 90 tablet, Rfl: 3   terbinafine  (LAMISIL ) 250 MG tablet, Take 1 tablet (250 mg total) by mouth daily., Disp: 90 tablet, Rfl: 0   zolpidem  (AMBIEN ) 10 MG tablet, Take 1 tablet (10 mg total) by mouth at bedtime as needed. for sleep, Disp: 30 tablet, Rfl: 1   tadalafil (CIALIS) 20 MG tablet, Take 20 mg by mouth daily as needed  for erectile dysfunction. (Patient not taking: Reported on 09/26/2023), Disp: , Rfl:   Physical exam:  Vitals:   09/26/23 0835  BP: 126/69  Pulse: 88  Resp: 20  Temp: 97.9 F (36.6 C)  SpO2: 100%  Weight: 163 lb 12.8 oz (74.3 kg)  Height: 5' 9 (1.753 m)   Physical Exam Cardiovascular:     Rate and Rhythm: Normal rate and regular rhythm.     Heart sounds: Normal heart sounds.  Pulmonary:     Effort: Pulmonary effort is normal.     Breath sounds: Normal breath sounds.  Abdominal:     General: Bowel sounds  are normal.     Palpations: Abdomen is soft.     Comments: Surgical staples in place from recent surgery.  Healing well.  He also has a drain in place  Skin:    General: Skin is warm and dry.  Neurological:     Mental Status: He is alert and oriented to person, place, and time.      I have personally reviewed labs listed below:    Latest Ref Rng & Units 09/21/2023    5:34 AM  CMP  Glucose 70 - 99 mg/dL 855   BUN 8 - 23 mg/dL 15   Creatinine 9.38 - 1.24 mg/dL 9.36   Sodium 864 - 854 mmol/L 137   Potassium 3.5 - 5.1 mmol/L 4.5   Chloride 98 - 111 mmol/L 103   CO2 22 - 32 mmol/L 25   Calcium  8.9 - 10.3 mg/dL 8.5   Total Protein 6.5 - 8.1 g/dL 6.1   Total Bilirubin 0.0 - 1.2 mg/dL 0.5   Alkaline Phos 38 - 126 U/L 46   AST 15 - 41 U/L 16   ALT 0 - 44 U/L 7       Latest Ref Rng & Units 09/21/2023    5:34 AM  CBC  WBC 4.0 - 10.5 K/uL 6.6   Hemoglobin 13.0 - 17.0 g/dL 9.5   Hematocrit 60.9 - 52.0 % 31.8   Platelets 150 - 400 K/uL 264    I have personally reviewed Radiology images listed below: No images are attached to the encounter.  US  EKG SITE RITE Result Date: 09/19/2023 If Site Rite image not attached, placement could not be confirmed due to current cardiac rhythm.  DG Abd 1 View Result Date: 09/18/2023 CLINICAL DATA:  68 year old male with colorectal carcinoma. Postoperative day 4 status post right colectomy. Ileus. EXAM: ABDOMEN - 1 VIEW COMPARISON:  Restaging CT Chest, Abdomen, and Pelvis 08/25/2023. FINDINGS: Portable AP supine view at 0553 hours. Chronic lumbosacral fusion hardware, left hip arthroplasty, right sacral stimulator device. New midline and left abdominal skin staples. Percutaneous drain tracking to the right lateral abdomen, probably the right gutter region. Bowel-gas pattern similar to August CT. Gas-filled small bowel and residual large bowel loops with paucity of gas in the pelvis. Stable pelvic phlebolith. Stable visualized osseous structures. Calcified  femoral artery atherosclerosis. IMPRESSION: 1. Postoperative changes with percutaneous drain in place. 2. Bowel-gas pattern compatible with postoperative ileus but also similar to August CT. Electronically Signed   By: VEAR Hurst M.D.   On: 09/18/2023 08:45     Assessment and plan- Patient is a 68 y.o. 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  23 examined lymph node negative for malignancy.  No evidence of lymphovascular invasion.  Tumor budding score less than 10.  The only risk factor for patient in this case is a stage IIb T4 disease.  He did have a mildly elevated CEA of 5.5 preoperatively.  I am inclined to offer him adjuvant chemotherapy based on T4 disease but I would also like to get CT DNA testing with [employer] to corroborate the findings and decide how to go about adjuvant chemotherapy.  I will see him back after [employer] testing results are back. Treatment will be given with a curative intent.  Patient had MSI stable disease but I will also refer him for genetic testing   Cancer Staging  Colon cancer [facility]) Staging form: Colon and Rectum, AJCC 8th Edition - Pathologic stage from 09/26/2023: Stage IIB (pT4a, pN0, cM0) - Signed by [name], MD on 09/26/2023 Total positive nodes: 0 Histologic grading system: 4 grade system Histologic grade (G): G2 Residual tumor (R): R0     Visit Diagnosis 1. Malignant neoplasm of colon, unspecified part of colon (HCC)      Dr. Annah Melanee, MD, MPH [facility] [phone] 09/26/2023 12:30 PM

## 2023-09-28 NOTE — Progress Notes (Unsigned)
 [facility] Clinic Note   Referring Physician: 08/25 admission PCP: [name], MD Cardiologist: None   Chief Complaint: fatigue   HPI:  Erik Martinez is a 68 y/o male with a history of HFpEF (08/25), T2DM, chronic pain, hyperlipidemia, anemia, colon cancer (08/25), bipolar d/o, GERD, Hep B/C s/p treatment, previous cocaine use and bladder stimulator due to nerve injury from back surgery.    Admitted 08/22/23 with shortness of breath and leg edema due to HF exacerbation due to severe anemia. Hg 7.3. Chest x-ray consistent with pulmonary vascular congestion. BNP elevated. Started on IV lasix  and cardiology consulted. Started on protonix  and GI consulted for anemia. Echo 08/24/23: EF 50-55%, G1DD, normal RV, mild/ moderate Erik. Normal EGD 08/25/23. Colonoscopy 08/25/23 with a partially obstructing large mass in the transverse colon, likely malignant, biopsied. Surgery consulted. Chest/ abdomen/ pelvic CT 08/25/23 showing 4mm nodule, 5mm nodule right lung, colon mass.   Seen in [facility] 08/28/23 where furosemide  20mg  daily and spironolactone  25mg  daily were started.   Admitted 09/13/23 for laparoscopic right colectomy for invasive adenocarcinoma of right colon. PICC line was placed on 09/09 for TPN.   He presents today for a HF follow-up visit with a chief complaint of fatigue. Has occasional associated back muscle spasms. Had his colectomy done earlier this month and still has some abdominal tenderness but only mild. 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   Lost his pit bull dog ~ 07/25 and was devastated. Received a 6 week [place] Shepherd puppy 08/27/23. Does paint/ body work.   Denies tobacco, alcohol, drug use.   ROS: All systems negative except what is listed in HPI, PMH and Problem List   Past Medical History:  Diagnosis Date    (HFpEF) heart failure with preserved ejection fraction (HCC)    Adenocarcinoma, colon (HCC) 08/25/2023   Anemia    Anxiety    Aortic atherosclerosis (HCC)    Arthritis    Avascular necrosis of bones of both hips (HCC)    Bipolar disorder (HCC)    Bladder spasms    Chronic pain of left knee    Chronic pain syndrome    a.) followed by pain clinic in [place]   Chronic, continuous use of opioids    a.) managed by pain clinic in [place]   DDD (degenerative disc disease)    Depression    Erectile dysfunction    a.) on PDE5i (tadalafil)   Frequency-urgency syndrome    GERD (gastroesophageal reflux disease)    Hepatitis C 2018   History of anesthesia complications    a.) PONV; b.) PDPH; c.) difficult to sedate   History of hepatitis B    1983  TX'D--  NO ISSUES OR SYMPTOMS SINCE   Hyperlipidemia    Hypotestosteronemia    Insomnia    a.) on hypnotic PRN (zolpidem )   Multiple lung nodules on CT    a.) CT CAP 08/25/2023: along oblique fissures - favored to be benign   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     celecoxib  (CELEBREX ) 200 MG capsule Take 1 capsule (200 mg total) by mouth 2 (two) times daily.     Continuous Glucose Sensor (FREESTYLE LIBRE 3 PLUS SENSOR) MISC CHANCE SENSOR EVERY 15 DAYS 2 each 3   cyclobenzaprine  (FLEXERIL ) 10 MG tablet Take 10 mg by mouth 3 (three) times daily.     empagliflozin  (JARDIANCE ) 25 MG TABS tablet Take 1 tablet (25 mg total) by mouth daily. 90 tablet 1   furosemide  (LASIX ) 20 MG tablet Take 3 tablets (60 mg total) by mouth daily. 90 tablet 5    insulin  aspart (NOVOLOG ) 100 UNIT/ML injection Per sliding scale  MAX 15 units in 24 hour period. 10 mL PRN   iron  polysaccharides (NIFEREX) 150 MG capsule Take 1 capsule (150 mg total) by mouth daily. 30 capsule 0   lidocaine  (LIDODERM ) 5 % Place 3 patches onto the skin daily. Remove & Discard patch within 12 hours or as directed by MD 90 patch 0   lubiprostone  (AMITIZA ) 8 MCG capsule Take 1 capsule (8 mcg total) by mouth 3 (three) times daily. 90 capsule 1   metolazone  (ZAROXOLYN ) 2.5 MG tablet Take Metolazone  2.5 MG on Tuesday, Wednesday, Thursday 1/2 hour before you take your Lasix . 3 tablet 1   Oxycodone  HCl 20 MG TABS Take 1 tablet by mouth every 4 (four) hours as needed (Pain).     pantoprazole  (PROTONIX ) 40 MG tablet Take 1 tablet (40 mg total) by mouth daily. 90 tablet 3   polyethylene glycol (MIRALAX  / GLYCOLAX ) 17 g packet Take 17 g by mouth daily as needed for mild constipation. 14 each 0   pregabalin  (LYRICA ) 200 MG capsule Take 1 capsule (200 mg total) by mouth in the morning, at noon, and at bedtime. 90 capsule 0   Semaglutide , 2 MG/DOSE, (OZEMPIC , 2 MG/DOSE,) 8 MG/3ML SOPN Inject 2 mg into the skin once a week. Monday     spironolactone  (ALDACTONE ) 25 MG tablet Take 1 tablet (25 mg total) by mouth daily. 90 tablet 3   tadalafil (CIALIS) 20 MG tablet Take 20 mg by mouth daily as needed for erectile dysfunction. (Patient not taking: Reported on 09/26/2023)     terbinafine  (LAMISIL ) 250 MG tablet Take 1 tablet (250 mg total) by mouth daily. 90 tablet 0   zolpidem  (AMBIEN ) 10 MG tablet TAKE ONE TABLET AT BEDTIME IF NEEDED FORSLEEP 30 tablet 0   No current facility-administered medications for this visit.    Allergies  Allergen Reactions   Suboxone [Buprenorphine Hcl-Naloxone  Hcl] Nausea And Vomiting   Tape Other (See Comments)    Whelps *Paper Tape is ok    Codeine Rash, Itching and Nausea Only   Morphine  And Codeine Itching and Rash   Other Rash    Telemetry electrodes    Silicone Rash    Whelps -*Paper Tape is ok       Social History   Socioeconomic History   Marital status: Divorced    Spouse name: Not on file   Number of children: 3   Years of education: Not on file   Highest education level: Some college, no degree  Occupational History   Not on file  Tobacco Use   Smoking status: Never   Smokeless tobacco: Never  Vaping Use   Vaping status: Never Used  Substance and Sexual Activity   Alcohol use: Not Currently    Alcohol/week: 0.0 - 2.0 standard drinks of alcohol   Drug use: Not Currently    Types: Cocaine    Comment: last + cocaine UDS 07/2016   Sexual activity: Not Currently    Birth control/protection: None  Other Topics Concern   Not on file  Social History Narrative    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   Overall Financial Resource Strain (CARDIA)    Difficulty of Paying Living Expenses: Not hard at all  Food Insecurity: No Food Insecurity (09/26/2023)   Hunger Vital Sign    Worried About Running Out of Food in the Last Year: Never true    Ran Out of Food in the Last Year: Never true  Recent Concern: Food Insecurity - Food Insecurity Present (07/11/2023)   Received from [facility]   Hunger Vital Sign    Within the past 12 months, you worried that your food would run out before you got the money to buy more.: Sometimes true    Within the past 12 months, the food you bought just didn't last and you didn't have money to get more.: Never true  Transportation Needs: No Transportation Needs (09/26/2023)   PRAPARE - Administrator, Civil Service (Medical): No    Lack of Transportation (Non-Medical): No  Physical Activity: Inactive (01/28/2022)   Exercise Vital Sign    Days of Exercise per Week: 0 days    Minutes of Exercise per Session: 0 min  Stress: No Stress Concern Present  (01/28/2022)   [employer] of Occupational Health - Occupational Stress Questionnaire    Feeling of Stress : Not at all  Social Connections: Moderately Isolated (09/13/2023)   Social Connection and Isolation Panel    Frequency of Communication with Friends and Family: More than three times a week    Frequency of Social Gatherings with Friends and Family: More than three times a week    Attends Religious Services: More than 4 times per year    Active Member of [employer] or Organizations: No    Attends Banker Meetings: Never    Marital Status: Divorced  Catering manager Violence: Not At Risk (09/26/2023)   Humiliation, Afraid, Rape, and Kick questionnaire    Fear of Current or Ex-Partner: No    Emotionally Abused: No    Physically Abused: No    Sexually Abused: No      Family History  Problem Relation Age of Onset   Diabetes Mother    Arthritis Mother    Cancer Mother        lung cancer   Hyperlipidemia Mother    Stroke Mother    Alcohol abuse Father    Cancer Father        lung   Arthritis Father    Diabetes Brother    Vitals:   09/29/23 1159  BP: 113/68  Pulse: 95  SpO2: 100%  Weight: 163 lb (73.9 kg)   Wt Readings from Last 3 Encounters:  09/29/23 163 lb (73.9 kg)  09/26/23 163 lb 12.8 oz (74.3 kg)  09/21/23 163 lb 8 oz (74.2 kg)   Lab Results  Component Value Date   CREATININE 0.63 09/21/2023   CREATININE 0.85 09/20/2023   CREATININE 0.85 09/19/2023    PHYSICAL EXAM:  General:  Well appearing.  Cor: No JVD. Regular rhythm, rate.  Lungs: clear Abdomen: soft, nontender, nondistended. Extremities: 1+ pitting edema bilateral lower legs Neuro:. Affect pleasant   ECG: not done   ASSESSMENT & PLAN:  1: NICM with preserved ejection fraction- - suspect due to anemia or previous cocaine use - NYHA class II - euvolemic - weight down 22 pounds from last visit 1 month ago - Echo 08/24/23: EF 50-55%, G1DD, normal RV, mild/ moderate Erik - continue  furosemide  60mg  daily - continue jardiance  25mg  daily (DM dose) - continue metolazone  2.5mg  T/W/TH - continue spironolactone  25mg  daily - emphasized wearing compression socks daily with removal at bedtime - BMET 09/21/23 reviewed: sodium 137, potassium 4.5, creatinine 0.63 & GFR >60 - BNP 08/22/23 was 1782.6  2: T2DM- - A1c 07/18/23 was 7.4% - saw PCP Myrna) 08/25 - continue insulin / jardiance   3: Hyperlipidemia- - LDL 07/18/23 was 54 - continue atorvastatin  20mg  daily  4: Stage 2 colon cancer- - Colonoscopy 08/25/23 with a partially obstructing large mass in the transverse colon, likely malignant, biopsied. Surgery consulted.  - Chest/ abdomen/ pelvic CT 08/25/23 showing 4mm nodule left lung fissure, 5mm nodule right lung fissure favored benign, colon mass. (Per report) - Hg 09/21/23 was 9.5 - right colectomy 09/14/23   Return in 2 months, sooner if needed.   I spent 31 minutes reviewing records, interviewing/ examing patient and managing plan/ orders.    Ellouise DELENA Class, FNP 09/28/23

## 2023-09-29 ENCOUNTER — Ambulatory Visit: Attending: Family | Admitting: Family

## 2023-09-29 ENCOUNTER — Encounter: Payer: Self-pay | Admitting: Family

## 2023-09-29 VITALS — BP 113/68 | HR 95 | Wt 163.0 lb

## 2023-09-29 DIAGNOSIS — M62830 Muscle spasm of back: Secondary | ICD-10-CM | POA: Insufficient documentation

## 2023-09-29 DIAGNOSIS — R5383 Other fatigue: Secondary | ICD-10-CM | POA: Insufficient documentation

## 2023-09-29 DIAGNOSIS — Z7985 Long-term (current) use of injectable non-insulin antidiabetic drugs: Secondary | ICD-10-CM | POA: Diagnosis not present

## 2023-09-29 DIAGNOSIS — E782 Mixed hyperlipidemia: Secondary | ICD-10-CM | POA: Diagnosis not present

## 2023-09-29 DIAGNOSIS — C19 Malignant neoplasm of rectosigmoid junction: Secondary | ICD-10-CM

## 2023-09-29 DIAGNOSIS — C184 Malignant neoplasm of transverse colon: Secondary | ICD-10-CM | POA: Diagnosis not present

## 2023-09-29 DIAGNOSIS — I428 Other cardiomyopathies: Secondary | ICD-10-CM | POA: Insufficient documentation

## 2023-09-29 DIAGNOSIS — N319 Neuromuscular dysfunction of bladder, unspecified: Secondary | ICD-10-CM | POA: Diagnosis not present

## 2023-09-29 DIAGNOSIS — E785 Hyperlipidemia, unspecified: Secondary | ICD-10-CM | POA: Diagnosis not present

## 2023-09-29 DIAGNOSIS — Z794 Long term (current) use of insulin: Secondary | ICD-10-CM | POA: Diagnosis not present

## 2023-09-29 DIAGNOSIS — I5032 Chronic diastolic (congestive) heart failure: Secondary | ICD-10-CM | POA: Diagnosis not present

## 2023-09-29 DIAGNOSIS — Z79899 Other long term (current) drug therapy: Secondary | ICD-10-CM | POA: Diagnosis not present

## 2023-09-29 DIAGNOSIS — E119 Type 2 diabetes mellitus without complications: Secondary | ICD-10-CM | POA: Diagnosis not present

## 2023-09-29 DIAGNOSIS — Z8619 Personal history of other infectious and parasitic diseases: Secondary | ICD-10-CM | POA: Insufficient documentation

## 2023-09-29 DIAGNOSIS — Z7984 Long term (current) use of oral hypoglycemic drugs: Secondary | ICD-10-CM | POA: Insufficient documentation

## 2023-09-29 NOTE — Patient Instructions (Signed)
 Medication Changes:  No medication changes today!   Special Instructions // Education:  Please wear compression socks every day and remove them a bedtime.   Follow-Up in: Please follow up with the Advanced [facility] Failure Clinic in 2 months with Ellouise Class, FNP.   Thank you for choosing [facility].    At the Advanced [facility] Failure Clinic, you and your health needs are our priority. We have a designated team specialized in the treatment of Heart Failure. This Care Team includes your primary Heart Failure Specialized Cardiologist (physician), Advanced Practice Providers (APPs- Physician Assistants and Nurse Practitioners), and Pharmacist who all work together to provide you with the care you need, when you need it.   You may see any of the following providers on your designated Care Team at your next follow up:  Dr. Toribio Fuel Dr. Ezra Shuck Dr. Ria Commander Dr. Morene Brownie Ellouise Class, FNP Jaun Bash, RPH-CPP  Please be sure to bring in all your medications bottles to every appointment.   Need to Contact Us :  If you have any questions or concerns before your next appointment please send us  a message through [place] or call our office at [phone].    TO LEAVE A MESSAGE FOR THE NURSE SELECT OPTION 2, PLEASE LEAVE A MESSAGE INCLUDING: YOUR NAME DATE OF BIRTH CALL BACK NUMBER REASON FOR CALL**this is important as we prioritize the call backs  YOU WILL RECEIVE A CALL BACK THE SAME DAY AS LONG AS YOU CALL BEFORE 4:00 PM

## 2023-10-03 ENCOUNTER — Inpatient Hospital Stay: Admitting: Family Medicine

## 2023-10-03 DIAGNOSIS — M4802 Spinal stenosis, cervical region: Secondary | ICD-10-CM | POA: Diagnosis not present

## 2023-10-03 DIAGNOSIS — Z79891 Long term (current) use of opiate analgesic: Secondary | ICD-10-CM | POA: Diagnosis not present

## 2023-10-03 DIAGNOSIS — M961 Postlaminectomy syndrome, not elsewhere classified: Secondary | ICD-10-CM | POA: Diagnosis not present

## 2023-10-03 DIAGNOSIS — M545 Low back pain, unspecified: Secondary | ICD-10-CM | POA: Diagnosis not present

## 2023-10-03 DIAGNOSIS — G894 Chronic pain syndrome: Secondary | ICD-10-CM | POA: Diagnosis not present

## 2023-10-06 ENCOUNTER — Inpatient Hospital Stay: Admitting: Family Medicine

## 2023-10-16 ENCOUNTER — Other Ambulatory Visit: Payer: Self-pay | Admitting: Family Medicine

## 2023-10-16 ENCOUNTER — Encounter: Payer: Self-pay | Admitting: Pharmacist

## 2023-10-16 ENCOUNTER — Other Ambulatory Visit: Payer: Self-pay | Admitting: Podiatry

## 2023-10-16 DIAGNOSIS — Z79899 Other long term (current) drug therapy: Secondary | ICD-10-CM

## 2023-10-16 DIAGNOSIS — G4700 Insomnia, unspecified: Secondary | ICD-10-CM

## 2023-10-16 NOTE — Telephone Encounter (Unsigned)
 Copied from CRM [id number]. Topic: Clinical - Medication Refill >> Oct 16, 2023  2:59 PM Erik Martinez wrote: Medication: ambien  and (AMITIZA -patient need this medication stronger)  Has the patient contacted their pharmacy? Yes (Agent: If no, request that the patient contact the pharmacy for the refill. If patient does not wish to contact the pharmacy document the reason why and proceed with request.) (Agent: If yes, when and what did the pharmacy advise?)  This is the patient's preferred pharmacy:  [facility] - [place], [place] - [name] [name] [address] Phone: [phone] Fax: [phone]  Is this the correct pharmacy for this prescription? Yes If no, delete pharmacy and type the correct one.   Has the prescription been filled recently? Yes  Is the patient out of the medication? No  Has the patient been seen for an appointment in the last year OR does the patient have an upcoming appointment? Yes  Can we respond through MyChart? Yes  Agent: Please be advised that Rx refills may take up to 3 business days. We ask that you follow-up with your pharmacy.

## 2023-10-16 NOTE — Telephone Encounter (Signed)
 Last office visit 09/01/23 for hosptial follow up.  Last refilled:  zolpidem  09/26/2023 for #30 with no refills.    Lubiprostone  09/12/2023 for #90 with 1 refill.  Next Appt: 10/24/23 for hosptial follow up. Pharmacy is asking for next refill.

## 2023-10-17 MED ORDER — LUBIPROSTONE 8 MCG PO CAPS: 8.0000 ug | ORAL_CAPSULE | Freq: Three times a day (TID) | ORAL | 1 refills | Status: AC

## 2023-10-17 MED ORDER — ZOLPIDEM TARTRATE 10 MG PO TABS: 10.0000 mg | ORAL_TABLET | Freq: Every day | ORAL | 0 refills | Status: DC

## 2023-10-20 ENCOUNTER — Telehealth: Payer: Self-pay | Admitting: *Deleted

## 2023-10-20 NOTE — Telephone Encounter (Signed)
 The patient called and said that he had a tumor removed and he is not feeling good and he says he gets bleeding when he is having a bowel movement.  I spoke to Dr. Melanee and she feels that she will be seeing him next Tuesday to start getting up with chemo but with the bleeding with  bowel movements Erik Martinez says that you should call Dr. Tye about this issue.  Patient says that he knows the telephone number so he will call him

## 2023-10-24 ENCOUNTER — Ambulatory Visit: Admitting: Family Medicine

## 2023-10-24 ENCOUNTER — Inpatient Hospital Stay: Attending: Oncology | Admitting: Oncology

## 2023-10-24 ENCOUNTER — Encounter: Payer: Self-pay | Admitting: Oncology

## 2023-10-24 ENCOUNTER — Encounter: Payer: Self-pay | Admitting: Family Medicine

## 2023-10-24 VITALS — BP 115/77 | HR 94 | Temp 98.1°F | Resp 18 | Ht 68.0 in | Wt 177.0 lb

## 2023-10-24 VITALS — BP 100/60 | HR 98 | Temp 98.3°F | Ht 68.0 in | Wt 178.0 lb

## 2023-10-24 DIAGNOSIS — R569 Unspecified convulsions: Secondary | ICD-10-CM | POA: Insufficient documentation

## 2023-10-24 DIAGNOSIS — E1142 Type 2 diabetes mellitus with diabetic polyneuropathy: Secondary | ICD-10-CM

## 2023-10-24 DIAGNOSIS — Z7985 Long-term (current) use of injectable non-insulin antidiabetic drugs: Secondary | ICD-10-CM

## 2023-10-24 DIAGNOSIS — I2510 Atherosclerotic heart disease of native coronary artery without angina pectoris: Secondary | ICD-10-CM | POA: Diagnosis not present

## 2023-10-24 DIAGNOSIS — G894 Chronic pain syndrome: Secondary | ICD-10-CM

## 2023-10-24 DIAGNOSIS — Z79899 Other long term (current) drug therapy: Secondary | ICD-10-CM | POA: Diagnosis not present

## 2023-10-24 DIAGNOSIS — M79604 Pain in right leg: Secondary | ICD-10-CM

## 2023-10-24 DIAGNOSIS — T451X5A Adverse effect of antineoplastic and immunosuppressive drugs, initial encounter: Secondary | ICD-10-CM | POA: Insufficient documentation

## 2023-10-24 DIAGNOSIS — M129 Arthropathy, unspecified: Secondary | ICD-10-CM | POA: Diagnosis not present

## 2023-10-24 DIAGNOSIS — Z801 Family history of malignant neoplasm of trachea, bronchus and lung: Secondary | ICD-10-CM | POA: Insufficient documentation

## 2023-10-24 DIAGNOSIS — I700 Atherosclerosis of aorta: Secondary | ICD-10-CM | POA: Insufficient documentation

## 2023-10-24 DIAGNOSIS — Z79891 Long term (current) use of opiate analgesic: Secondary | ICD-10-CM | POA: Insufficient documentation

## 2023-10-24 DIAGNOSIS — I5032 Chronic diastolic (congestive) heart failure: Secondary | ICD-10-CM | POA: Insufficient documentation

## 2023-10-24 DIAGNOSIS — M79676 Pain in unspecified toe(s): Secondary | ICD-10-CM | POA: Diagnosis not present

## 2023-10-24 DIAGNOSIS — M79605 Pain in left leg: Secondary | ICD-10-CM | POA: Diagnosis not present

## 2023-10-24 DIAGNOSIS — Z794 Long term (current) use of insulin: Secondary | ICD-10-CM | POA: Diagnosis not present

## 2023-10-24 DIAGNOSIS — R21 Rash and other nonspecific skin eruption: Secondary | ICD-10-CM

## 2023-10-24 DIAGNOSIS — Z7984 Long term (current) use of oral hypoglycemic drugs: Secondary | ICD-10-CM | POA: Diagnosis not present

## 2023-10-24 DIAGNOSIS — C189 Malignant neoplasm of colon, unspecified: Secondary | ICD-10-CM | POA: Diagnosis not present

## 2023-10-24 DIAGNOSIS — E785 Hyperlipidemia, unspecified: Secondary | ICD-10-CM | POA: Diagnosis not present

## 2023-10-24 DIAGNOSIS — Z8619 Personal history of other infectious and parasitic diseases: Secondary | ICD-10-CM | POA: Diagnosis not present

## 2023-10-24 DIAGNOSIS — G4700 Insomnia, unspecified: Secondary | ICD-10-CM | POA: Insufficient documentation

## 2023-10-24 DIAGNOSIS — N529 Male erectile dysfunction, unspecified: Secondary | ICD-10-CM | POA: Diagnosis not present

## 2023-10-24 DIAGNOSIS — T8131XD Disruption of external operation (surgical) wound, not elsewhere classified, subsequent encounter: Secondary | ICD-10-CM | POA: Diagnosis not present

## 2023-10-24 DIAGNOSIS — S81002A Unspecified open wound, left knee, initial encounter: Secondary | ICD-10-CM | POA: Diagnosis not present

## 2023-10-24 DIAGNOSIS — Z5111 Encounter for antineoplastic chemotherapy: Secondary | ICD-10-CM | POA: Insufficient documentation

## 2023-10-24 DIAGNOSIS — C184 Malignant neoplasm of transverse colon: Secondary | ICD-10-CM | POA: Insufficient documentation

## 2023-10-24 DIAGNOSIS — K219 Gastro-esophageal reflux disease without esophagitis: Secondary | ICD-10-CM | POA: Diagnosis not present

## 2023-10-24 DIAGNOSIS — K1230 Oral mucositis (ulcerative), unspecified: Secondary | ICD-10-CM | POA: Diagnosis not present

## 2023-10-24 DIAGNOSIS — Z791 Long term (current) use of non-steroidal anti-inflammatories (NSAID): Secondary | ICD-10-CM | POA: Diagnosis not present

## 2023-10-24 DIAGNOSIS — T8189XD Other complications of procedures, not elsewhere classified, subsequent encounter: Secondary | ICD-10-CM | POA: Diagnosis not present

## 2023-10-24 DIAGNOSIS — Z96652 Presence of left artificial knee joint: Secondary | ICD-10-CM | POA: Diagnosis not present

## 2023-10-24 LAB — POCT GLYCOSYLATED HEMOGLOBIN (HGB A1C): Hemoglobin A1C: 7.5 % — AB (ref 4.0–5.6)

## 2023-10-24 MED ORDER — TRIAMCINOLONE ACETONIDE 0.5 % EX CREA: 1.0000 | TOPICAL_CREAM | Freq: Two times a day (BID) | CUTANEOUS | 0 refills | Status: DC

## 2023-10-24 NOTE — Progress Notes (Signed)
 Patient states he doesn't have any concerns, but would like to know what exactly is going on with him.

## 2023-10-24 NOTE — Patient Instructions (Signed)
 I will move forward with ABI testing to make sure blood flow to legs is not decreased.  Can use topical steroid at tips of fingers. Try to keep hands dry.  Call if you are interested in a counselor or a medication for depression.

## 2023-10-24 NOTE — Progress Notes (Signed)
 Patient ID: Erik Martinez, male    DOB: June 28, 1955, 69 y.o.   MRN: [id number]  This visit was conducted in person.  BP 100/60   Pulse 98   Temp 98.3 F (36.8 C) (Temporal)   Ht 5' 8 (1.727 m)   Wt 178 lb (80.7 kg)   SpO2 97%   BMI 27.06 kg/m    CC:  Chief Complaint  Patient presents with   Hospitalization Follow-up   Leg Pain   Foot Pain    Subjective:   HPI: Erik Martinez is a 68 y.o. male presenting on 10/24/2023 for Hospitalization Follow-up, Leg Pain, and Foot Pain  Reviewed office visit from oncology September 26, 2023 Colonoscopy on 08/25/2023 which showed a partially obstructing mass in the transverse colon.  This was biopsied and consistent with colon adenocarcinoma.  Upper endoscopy was normal.  Patient underwent CT chest abdomen and pelvis with contrast on 08/25/2023 which showed circumferential luminal narrowing of the colon at the hepatic flexure consistent with primary colonic neoplasm.  No evidence of metastatic adenopathy in the abdomen and pelvis.  No evidence of liver metastases.  No evidence of intrathoracic metastases.  Small nodules around the oblique seizures favored to be benign.Preoperative CEA was mildly elevated at 6.6.   Patient underwent right hemicolectomy on 09/14/2023 which showed invasive adenocarcinoma moderately differentiated 4 cm focally invading visceral peritoneum.  Margins negative.  23 examined lymph nodes negative.  No evidence of lymphovascular invasion.  Tumor budding score moderate at 5.  pT4 N0. MSI stable.     Currently discussing Adjuvant  chemotherapy.   Reviewed recent  cardiology OV note:  HFpEF echo August 24, 2023 EF 50 to 55%. On furosemide  60 mg daily, Jardiance  25 mg daily, Mutaz 1 2.5 mg Tuesday Wednesday Thursday, spironolactone  25 mg daily and compression socks. Nml electrolytes and GFR > 60    DM.[name] due for A1C... on jardiance  , semaglutide  and insulin . Lab Results  Component Value Date   HGBA1C 7.5 (A) 10/24/2023     Chronic pain.[name] in joints and now in  abdomen post op soreness. Still on lyrica ,  celebrex  and muscle relaxant   No change in activity, no falls.  Followed at pain clinic.[name] next appt 10/21.   Left knee with  suture/discharge coming from old scar.. has appt woth Ortho today.   Pain at tips of toes, pain in bilateral legs.  Relevant past medical, surgical, family and social history reviewed and updated as indicated. Interim medical history since our last visit reviewed. Allergies and medications reviewed and updated. Outpatient Medications Prior to Visit  Medication Sig Dispense Refill   atorvastatin  (LIPITOR) 20 MG tablet Take 20 mg by mouth at bedtime.     celecoxib  (CELEBREX ) 200 MG capsule Take 1 capsule (200 mg total) by mouth 2 (two) times daily.     Continuous Glucose Sensor (FREESTYLE LIBRE 3 PLUS SENSOR) MISC CHANCE SENSOR EVERY 15 DAYS 2 each 3   cyclobenzaprine  (FLEXERIL ) 10 MG tablet Take 10 mg by mouth 3 (three) times daily.     empagliflozin  (JARDIANCE ) 25 MG TABS tablet Take 1 tablet (25 mg total) by mouth daily. 90 tablet 1   furosemide  (LASIX ) 20 MG tablet Take 3 tablets (60 mg total) by mouth daily. 90 tablet 5   insulin  aspart (NOVOLOG ) 100 UNIT/ML injection Per sliding scale  MAX 15 units in 24 hour period. (Patient taking differently: Inject into the skin. Per sliding scale  MAX 15 units in 24 hour  period.) 10 mL PRN   iron  polysaccharides (NIFEREX) 150 MG capsule Take 1 capsule (150 mg total) by mouth daily. 30 capsule 0   lidocaine  (LIDODERM ) 5 % Place 3 patches onto the skin daily. Remove & Discard patch within 12 hours or as directed by MD (Patient taking differently: Place 3 patches onto the skin as needed. Remove & Discard patch within 12 hours or as directed by MD) 90 patch 0   lubiprostone  (AMITIZA ) 8 MCG capsule Take 1 capsule (8 mcg total) by mouth 3 (three) times daily. 90 capsule 1   Oxycodone  HCl 20 MG TABS Take 1 tablet by mouth every 4 (four) hours as  needed (Pain).     pantoprazole  (PROTONIX ) 40 MG tablet Take 1 tablet (40 mg total) by mouth daily. (Patient taking differently: Take 40 mg by mouth every morning.) 90 tablet 3   polyethylene glycol (MIRALAX  / GLYCOLAX ) 17 g packet Take 17 g by mouth daily as needed for mild constipation. 14 each 0   pregabalin  (LYRICA ) 200 MG capsule Take 1 capsule (200 mg total) by mouth in the morning, at noon, and at bedtime. 90 capsule 0   Semaglutide , 2 MG/DOSE, (OZEMPIC , 2 MG/DOSE,) 8 MG/3ML SOPN Inject 2 mg into the skin once a week. Monday     spironolactone  (ALDACTONE ) 25 MG tablet Take 1 tablet (25 mg total) by mouth daily. 90 tablet 3   tadalafil (CIALIS) 20 MG tablet Take 20 mg by mouth daily as needed for erectile dysfunction.     terbinafine  (LAMISIL ) 250 MG tablet TAKE 1 TABLET BY MOUTH ONCE DAILY 90 tablet 0   zolpidem  (AMBIEN ) 10 MG tablet Take 1 tablet (10 mg total) by mouth at bedtime. 30 tablet 0   metolazone  (ZAROXOLYN ) 2.5 MG tablet Take Metolazone  2.5 MG on Tuesday, Wednesday, Thursday 1/2 hour before you take your Lasix . (Patient not taking: Reported on 10/27/2023) 3 tablet 1   No facility-administered medications prior to visit.     Per HPI unless specifically indicated in ROS section below Review of Systems  Constitutional:  Negative for fatigue and fever.  HENT:  Negative for ear pain.   Eyes:  Negative for pain.  Respiratory:  Negative for cough and shortness of breath.   Cardiovascular:  Negative for chest pain, palpitations and leg swelling.  Gastrointestinal:  Negative for abdominal pain.  Genitourinary:  Negative for dysuria.  Musculoskeletal:  Positive for arthralgias, back pain and myalgias.  Neurological:  Negative for syncope, light-headedness and headaches.  Psychiatric/Behavioral:  Negative for dysphoric mood.    Objective:  BP 100/60   Pulse 98   Temp 98.3 F (36.8 C) (Temporal)   Ht 5' 8 (1.727 m)   Wt 178 lb (80.7 kg)   SpO2 97%   BMI 27.06 kg/m   Wt  Readings from Last 3 Encounters:  11/01/23 184 lb 6.4 oz (83.6 kg)  10/27/23 177 lb 0.5 oz (80.3 kg)  10/24/23 177 lb (80.3 kg)      Physical Exam Constitutional:      Appearance: He is well-developed.  HENT:     Head: Normocephalic.     Right Ear: Hearing normal.     Left Ear: Hearing normal.     Nose: Nose normal.  Neck:     Thyroid : No thyroid  mass or thyromegaly.     Vascular: No carotid bruit.     Trachea: Trachea normal.  Cardiovascular:     Rate and Rhythm: Normal rate and regular rhythm.  Pulses:          Dorsalis pedis pulses are 0 on the right side and 0 on the left side.       Posterior tibial pulses are 1+ on the right side and 1+ on the left side.     Heart sounds: Heart sounds not distant. No murmur heard.    No friction rub. No gallop.     Comments: No peripheral edema Pulmonary:     Effort: Pulmonary effort is normal. No respiratory distress.     Breath sounds: Normal breath sounds.  Abdominal:     Tenderness: There is abdominal tenderness.     Comments: Well-healing surgical site but patient still with tenderness.  Musculoskeletal:     Left knee: Decreased range of motion. Tenderness present.       Legs:     Comments: Small opening of scar on left knee, possible retained suture, no redness in the joint or obvious swelling beyond baseline deformity following total knee replacement.  Skin:    General: Skin is warm and dry.     Findings: No rash.  Psychiatric:        Speech: Speech normal.        Behavior: Behavior normal.        Thought Content: Thought content normal.       Results for orders placed or performed in visit on 10/24/23  POCT glycosylated hemoglobin (Hb A1C)   Collection Time: 10/24/23 12:45 PM  Result Value Ref Range   Hemoglobin A1C 7.5 (A) 4.0 - 5.6 %   HbA1c POC (<> result, manual entry)     HbA1c, POC (prediabetic range)     HbA1c, POC (controlled diabetic range)      Assessment and Plan  Type 2 diabetes mellitus with  diabetic polyneuropathy, without long-term current use of insulin  [facility]) Assessment & Plan: Tolerable control  in setting of new start chemotherapy for colon cancer.  Ozempic  2 mg weekly.  Metformin  1000 mg BID  jardiance  25 mg daily  Insulin  Novolog   per Sliding scale  Orders: -     POCT glycosylated hemoglobin (Hb A1C)  Bilateral leg pain -     VAS US  ABI WITH/WO TBI; Future  Chronic pain syndrome Assessment & Plan: Chronic, goes to [facility] for pain... Next office visit October 21  He has chronic low pain ( history of spinal stimulator), hand pain,  left hip.   Hx of avascular necrosis bilateral hips.  After back surgery had nerve issue with bladder.. under control now with bladder stimulator.  Chronic left knee pain ( complicated repair history involving joint/prosthesis infection s/p revision now with worsening, left knee pain, opening at scar.  New pain in bilateral legs.   Open wound of left knee, initial encounter Assessment & Plan: Acute, concern about retained suture, no definite sign of joint infection. Has upcoming visit with orthopedics later today.   Pain in both lower extremities Assessment & Plan: Acute worsening of chronic issue.  Patient has multiple reasons for bilateral lower leg pain including possible ongoing issue with left knee.  Pulses bilaterally feel diminished on exam today.  Recommend ABI evaluation for blood flow to rule out peripheral artery disease.   Rash  Other orders -     Triamcinolone  Acetonide; Apply 1 Application topically 2 (two) times daily.  Dispense: 30 g; Refill: 0    Return in about 3 months (around 01/24/2024) for diabetes follow up POC A1C.   Erik Ring, MD

## 2023-10-25 ENCOUNTER — Telehealth: Payer: Self-pay | Admitting: *Deleted

## 2023-10-25 ENCOUNTER — Ambulatory Visit: Payer: Self-pay | Admitting: General Surgery

## 2023-10-25 ENCOUNTER — Inpatient Hospital Stay
Admission: RE | Admit: 2023-10-25 | Discharge: 2023-10-25 | Disposition: A | Discharge status: Home / Self Care | Source: Ambulatory Visit

## 2023-10-25 ENCOUNTER — Encounter: Payer: Self-pay | Admitting: Oncology

## 2023-10-25 MED ORDER — PROCHLORPERAZINE MALEATE 10 MG PO TABS
10.0000 mg | ORAL_TABLET | Freq: Four times a day (QID) | ORAL | 1 refills | Status: AC | PRN
Start: 2023-10-25 — End: ?

## 2023-10-25 MED ORDER — LIDOCAINE-PRILOCAINE 2.5-2.5 % EX CREA: TOPICAL_CREAM | CUTANEOUS | 3 refills | Status: AC

## 2023-10-25 MED ORDER — ONDANSETRON HCL 8 MG PO TABS: 8.0000 mg | ORAL_TABLET | Freq: Three times a day (TID) | ORAL | 1 refills | Status: AC | PRN

## 2023-10-25 NOTE — Progress Notes (Signed)
 Pharmacist Chemotherapy Monitoring - Initial Assessment    Anticipated start date: 11/01/23   The following has been reviewed per standard work regarding the patient's treatment regimen: The patient's diagnosis, treatment plan and drug doses, and organ/hematologic function Lab orders and baseline tests specific to treatment regimen  The treatment plan start date, drug sequencing, and pre-medications Prior authorization status  Patient's documented medication list, including drug-drug interaction screen and prescriptions for anti-emetics and supportive care specific to the treatment regimen The drug concentrations, fluid compatibility, administration routes, and timing of the medications to be used The patient's access for treatment and lifetime cumulative dose history, if applicable  The patient's medication allergies and previous infusion related reactions, if applicable   Changes made to treatment plan:  N/A  Follow up needed:  N/A   Maudie FORBES Andreas, RPH, 10/25/2023  2:02 PM

## 2023-10-25 NOTE — Telephone Encounter (Signed)
 The patient has called in saying that that his prescription for the Zofran  in the Emla cream he says not covered and needs  to have prior Auth and he would like to get it.

## 2023-10-25 NOTE — Patient Instructions (Signed)
 Your procedure is scheduled on: Friday 10/27/23 Report to the Registration Desk on the 1st floor of the [facility]. To find out your arrival time, please call [phone] between 1PM - 3PM on: Thursday 10/26/23 If your arrival time is 6:00 am, do not arrive before that time as the [facility] Mall entrance doors do not open until 6:00 am.  REMEMBER: Instructions that are not followed completely may result in serious medical risk, up to and including death; or upon the discretion of your surgeon and anesthesiologist your surgery may need to be rescheduled.  Do not eat food or drink fluids after midnight the night before surgery.  No gum chewing or hard candies.    One week prior to surgery: Stop Anti-inflammatories (NSAIDS) such as Advil, Aleve, Ibuprofen, Motrin, Naproxen, Naprosyn and Aspirin  based products such as Excedrin, Goody's Powder, BC Powder. Stop ANY OVER THE COUNTER supplements until after surgery. Stop Semaglutide , 2 MG/DOSE, (OZEMPIC , 2 MG/DOSE,) 8 MG/3ML 7 days prior to surgery.  Stop tadalafil (CIALIS) 20 MG 2 days prior to surgery (do Not take after 10/24/23)  You may however, continue to take Tylenol  if needed for pain up until the day of surgery.  Continue taking all of your other prescription medications up until the day of surgery.  ON THE DAY OF SURGERY ONLY TAKE THESE MEDICATIONS WITH SIPS OF WATER :  pantoprazole  (PROTONIX ) 40 MG  pregabalin  (LYRICA ) 200 MG    No Alcohol for 24 hours before or after surgery.  No Smoking including e-cigarettes for 24 hours before surgery.  No chewable tobacco products for at least 6 hours before surgery.  No nicotine patches on the day of surgery.  Do not use any recreational drugs for at least a week (preferably 2 weeks) before your surgery.  Please be advised that the combination of cocaine and anesthesia may have negative outcomes, up to and including death. If you test positive for cocaine, your surgery will be  cancelled.  On the morning of surgery brush your teeth with toothpaste and water , you may rinse your mouth with mouthwash if you wish. Do not swallow any toothpaste or mouthwash.  Use CHG Soap or wipes as directed on instruction sheet.  Do not wear jewelry, make-up, hairpins, clips or nail polish.  For welded (permanent) jewelry: bracelets, anklets, waist bands, etc.  Please have this removed prior to surgery.  If it is not removed, there is a chance that hospital personnel will need to cut it off on the day of surgery.  Do not wear lotions, powders, or perfumes.   Do not shave body hair from the neck down 48 hours before surgery.  Contact lenses, hearing aids and dentures may not be worn into surgery.  Do not bring valuables to the hospital. [facility] is not responsible for any missing/lost belongings or valuables.   Total Shoulder Arthroplasty:  use Benzoyl Peroxide 5% Gel as directed on instruction sheet.  Bring your C-PAP to the hospital in case you may have to spend the night.   Notify your doctor if there is any change in your medical condition (cold, fever, infection).  Wear comfortable clothing (specific to your surgery type) to the hospital.  After surgery, you can help prevent lung complications by doing breathing exercises.  Take deep breaths and cough every 1-2 hours. Your doctor may order a device called an Incentive Spirometer to help you take deep breaths. When coughing or sneezing, hold a pillow firmly against your incision with both hands. This  is called "splinting." Doing this helps protect your incision. It also decreases belly discomfort.  If you are being admitted to the hospital overnight, leave your suitcase in the car. After surgery it may be brought to your room.  In case of increased patient census, it may be necessary for you, the patient, to continue your postoperative care in the Same Day Surgery department.  If you are being discharged the day of  surgery, you will not be allowed to drive home. You will need a responsible individual to drive you home and stay with you for 24 hours after surgery.   If you are taking public transportation, you will need to have a responsible individual with you.  Please call the Pre-admissions Testing Dept. at [phone] if you have any questions about these instructions.  Surgery Visitation Policy:  Patients having surgery or a procedure may have two visitors.  Children under the age of 52 must have an adult with them who is not the patient.  Inpatient Visitation:    Visiting hours are 7 a.m. to 8 p.m. Up to four visitors are allowed at one time in a patient room. The visitors may rotate out with other people during the day.  One visitor age 36 or older may stay with the patient overnight and must be in the room by 8 p.m.   Merchandiser, retail to address health-related social needs:  [email or web address]

## 2023-10-25 NOTE — Telephone Encounter (Signed)
 Submitted prior auth or ondansetron  to be reviewed by Cover My Meds: Clinical Key [id number].  Also completed prior auth request for EMLA Cream: Clinical Key [id number].

## 2023-10-25 NOTE — Progress Notes (Signed)
 START ON PATHWAY REGIMEN - Colorectal     A cycle is every 14 days:     Leucovorin      Fluorouracil      Fluorouracil   **Always confirm dose/schedule in your pharmacy ordering system**  Patient Characteristics: Postoperative without Neoadjuvant Therapy, M0 (Pathologic Staging), Colon, Stage II, MSS/pMMR, Stage IIB/C Tumor Location: Colon Therapeutic Status: Postoperative without Neoadjuvant Therapy, M0 (Pathologic Staging) AJCC M Category: cM0 AJCC T Category: pT4a AJCC N Category: pN0 AJCC 8 Stage Grouping: IIB Microsatellite/Mismatch Repair Status: MSS/pMMR Intent of Therapy: Curative Intent, Discussed with Patient

## 2023-10-26 ENCOUNTER — Other Ambulatory Visit: Payer: Self-pay

## 2023-10-26 ENCOUNTER — Encounter
Admission: RE | Admit: 2023-10-26 | Discharge: 2023-10-26 | Disposition: A | Discharge status: Home / Self Care | Source: Ambulatory Visit | Attending: General Surgery | Admitting: General Surgery

## 2023-10-26 ENCOUNTER — Telehealth: Payer: Self-pay

## 2023-10-26 DIAGNOSIS — Z01818 Encounter for other preprocedural examination: Secondary | ICD-10-CM

## 2023-10-26 NOTE — Patient Instructions (Addendum)
 Your procedure is scheduled on:10-27-23 Friday Report to the Registration Desk on the 1st floor of the [facility].Then proceed to the 2nd floor Surgery Desk To find out your arrival time, please call [phone] between 1PM - 3PM on:10-26-23 Thursday If your arrival time is 6:00 am, do not arrive before that time as the [facility] entrance doors do not open until 6:00 am.  REMEMBER: Instructions that are not followed completely may result in serious medical risk, up to and including death; or upon the discretion of your surgeon and anesthesiologist your surgery may need to be rescheduled.  Do not eat food OR drink liquids after midnight the night before surgery.  No gum chewing or hard candies.  One week prior to surgery:Stop NOW (11-05-23) Stop Anti-inflammatories (NSAIDS) such as Advil, Aleve, Ibuprofen, Motrin, Naproxen, Naprosyn and Aspirin  based products such as Excedrin, Goody's Powder, BC Powder. Stop ANY OVER THE COUNTER supplements until after surgery.  You may however, continue to take Tylenol  if needed for pain up until the day of surgery.  Stop empagliflozin  (JARDIANCE ) 3 days prior to surgery-Last dose was on 10-26-23  Stop Ozempic  7 days prior to surgery-Last dose was on 10-21-23 Saturday  Stop tadalafil (CIALIS) 2 days prior to surgery-Stop NOW (10-26-23)  Continue taking all of your other prescription medications up until the day of surgery.  ON THE DAY OF SURGERY ONLY TAKE THESE MEDICATIONS WITH SIPS OF WATER : -pantoprazole  (PROTONIX )  -pregabalin  (LYRICA )   Do NOT take any Insulin  the morning of surgery  No Alcohol for 24 hours before or after surgery.  No Smoking including e-cigarettes for 24 hours before surgery.  No chewable tobacco products for at least 6 hours before surgery.  No nicotine patches on the day of surgery.  Do not use any recreational drugs for at least a week (preferably 2 weeks) before your surgery.  Please be advised that the  combination of cocaine and anesthesia may have negative outcomes, up to and including death. If you test positive for cocaine, your surgery will be cancelled.  On the morning of surgery brush your teeth with toothpaste and water , you may rinse your mouth with mouthwash if you wish. Do not swallow any toothpaste or mouthwash.  Do not wear jewelry, make-up, hairpins, clips or nail polish.  For welded (permanent) jewelry: bracelets, anklets, waist bands, etc.  Please have this removed prior to surgery.  If it is not removed, there is a chance that hospital personnel will need to cut it off on the day of surgery.  Do not wear lotions, powders, or perfumes.   Do not shave body hair from the neck down 48 hours before surgery.  Contact lenses, hearing aids and dentures may not be worn into surgery.  Do not bring valuables to the hospital. [facility] is not responsible for any missing/lost belongings or valuables.   Notify your doctor if there is any change in your medical condition (cold, fever, infection).  Wear comfortable clothing (specific to your surgery type) to the hospital.  After surgery, you can help prevent lung complications by doing breathing exercises.  Take deep breaths and cough every 1-2 hours. Your doctor may order a device called an Incentive Spirometer to help you take deep breaths. When coughing or sneezing, hold a pillow firmly against your incision with both hands. This is called "splinting." Doing this helps protect your incision. It also decreases belly discomfort.  If you are being admitted to the hospital overnight, leave your suitcase  in the car. After surgery it may be brought to your room.  In case of increased patient census, it may be necessary for you, the patient, to continue your postoperative care in the Same Day Surgery department.  If you are being discharged the day of surgery, you will not be allowed to drive home. You will need a responsible  individual to drive you home and stay with you for 24 hours after surgery.   If you are taking public transportation, you will need to have a responsible individual with you.  Please call the Pre-admissions Testing Dept. at [phone] if you have any questions about these instructions.  Surgery Visitation Policy:  Patients having surgery or a procedure may have two visitors.  Children under the age of 41 must have an adult with them who is not the patient.   Merchandiser, retail to address health-related social needs:  [email or web address]

## 2023-10-26 NOTE — Telephone Encounter (Signed)
 [facility] has been unsuccessful at contacting Erik Martinez to arrange his port placement. I have attempted to call and phone goes straight to voicemail. Left him a vm with phone number to surgery. Noted that he does not read my chart messages.

## 2023-10-27 ENCOUNTER — Ambulatory Visit

## 2023-10-27 ENCOUNTER — Encounter: Payer: Self-pay | Admitting: Oncology

## 2023-10-27 ENCOUNTER — Encounter: Payer: Self-pay | Admitting: General Surgery

## 2023-10-27 ENCOUNTER — Ambulatory Visit: Payer: Self-pay | Admitting: Urgent Care

## 2023-10-27 ENCOUNTER — Other Ambulatory Visit: Payer: Self-pay

## 2023-10-27 ENCOUNTER — Ambulatory Visit: Admitting: Anesthesiology

## 2023-10-27 ENCOUNTER — Encounter
Admission: RE | Disposition: A | Discharge status: Home / Self Care | Payer: Self-pay | Source: Home / Self Care | Attending: General Surgery

## 2023-10-27 ENCOUNTER — Ambulatory Visit
Admission: RE | Admit: 2023-10-27 | Discharge: 2023-10-27 | Disposition: A | Discharge status: Home / Self Care | Attending: General Surgery | Admitting: General Surgery

## 2023-10-27 DIAGNOSIS — K219 Gastro-esophageal reflux disease without esophagitis: Secondary | ICD-10-CM | POA: Insufficient documentation

## 2023-10-27 DIAGNOSIS — Z7984 Long term (current) use of oral hypoglycemic drugs: Secondary | ICD-10-CM | POA: Insufficient documentation

## 2023-10-27 DIAGNOSIS — I5032 Chronic diastolic (congestive) heart failure: Secondary | ICD-10-CM | POA: Diagnosis not present

## 2023-10-27 DIAGNOSIS — Z7985 Long-term (current) use of injectable non-insulin antidiabetic drugs: Secondary | ICD-10-CM | POA: Insufficient documentation

## 2023-10-27 DIAGNOSIS — M1990 Unspecified osteoarthritis, unspecified site: Secondary | ICD-10-CM | POA: Insufficient documentation

## 2023-10-27 DIAGNOSIS — Z833 Family history of diabetes mellitus: Secondary | ICD-10-CM | POA: Insufficient documentation

## 2023-10-27 DIAGNOSIS — Z794 Long term (current) use of insulin: Secondary | ICD-10-CM | POA: Diagnosis not present

## 2023-10-27 DIAGNOSIS — R0989 Other specified symptoms and signs involving the circulatory and respiratory systems: Secondary | ICD-10-CM | POA: Diagnosis not present

## 2023-10-27 DIAGNOSIS — D649 Anemia, unspecified: Secondary | ICD-10-CM | POA: Diagnosis not present

## 2023-10-27 DIAGNOSIS — Z452 Encounter for adjustment and management of vascular access device: Secondary | ICD-10-CM | POA: Diagnosis not present

## 2023-10-27 DIAGNOSIS — M797 Fibromyalgia: Secondary | ICD-10-CM | POA: Insufficient documentation

## 2023-10-27 DIAGNOSIS — C183 Malignant neoplasm of hepatic flexure: Secondary | ICD-10-CM | POA: Diagnosis not present

## 2023-10-27 DIAGNOSIS — C189 Malignant neoplasm of colon, unspecified: Secondary | ICD-10-CM | POA: Insufficient documentation

## 2023-10-27 DIAGNOSIS — E1151 Type 2 diabetes mellitus with diabetic peripheral angiopathy without gangrene: Secondary | ICD-10-CM | POA: Insufficient documentation

## 2023-10-27 DIAGNOSIS — Z01818 Encounter for other preprocedural examination: Secondary | ICD-10-CM

## 2023-10-27 DIAGNOSIS — C182 Malignant neoplasm of ascending colon: Secondary | ICD-10-CM | POA: Diagnosis not present

## 2023-10-27 DIAGNOSIS — F319 Bipolar disorder, unspecified: Secondary | ICD-10-CM | POA: Diagnosis not present

## 2023-10-27 DIAGNOSIS — G709 Myoneural disorder, unspecified: Secondary | ICD-10-CM | POA: Diagnosis not present

## 2023-10-27 HISTORY — PX: PORTACATH PLACEMENT: SHX2246

## 2023-10-27 LAB — POCT I-STAT, CHEM 8
BUN: 16 mg/dL (ref 8–23)
Calcium, Ion: 1.13 mmol/L — ABNORMAL LOW (ref 1.15–1.40)
Chloride: 103 mmol/L (ref 98–111)
Creatinine, Ser: 0.9 mg/dL (ref 0.61–1.24)
Glucose, Bld: 147 mg/dL — ABNORMAL HIGH (ref 70–99)
HCT: 30 % — ABNORMAL LOW (ref 39.0–52.0)
Hemoglobin: 10.2 g/dL — ABNORMAL LOW (ref 13.0–17.0)
Potassium: 3.7 mmol/L (ref 3.5–5.1)
Sodium: 139 mmol/L (ref 135–145)
TCO2: 24 mmol/L (ref 22–32)

## 2023-10-27 LAB — GLUCOSE, CAPILLARY
Glucose-Capillary: 131 mg/dL — ABNORMAL HIGH (ref 70–99)
Glucose-Capillary: 134 mg/dL — ABNORMAL HIGH (ref 70–99)

## 2023-10-27 SURGERY — INSERTION, TUNNELED CENTRAL VENOUS DEVICE, WITH PORT
Anesthesia: General | Site: Chest

## 2023-10-27 MED ORDER — ONDANSETRON HCL 4 MG/2ML IJ SOLN
INTRAMUSCULAR | Status: AC
  Filled 2023-10-27: qty 2

## 2023-10-27 MED ORDER — LACTATED RINGERS IV SOLN: INTRAVENOUS | Status: DC | PRN

## 2023-10-27 MED ORDER — CEFAZOLIN SODIUM-DEXTROSE 2-4 GM/100ML-% IV SOLN
2.0000 g | INTRAVENOUS | Status: AC
  Administered 2023-10-27: 2 g via INTRAVENOUS

## 2023-10-27 MED ORDER — PROPOFOL 10 MG/ML IV BOLUS
INTRAVENOUS | Status: DC | PRN
  Administered 2023-10-27: 160 mg via INTRAVENOUS

## 2023-10-27 MED ORDER — OXYCODONE HCL 5 MG PO TABS
ORAL_TABLET | ORAL | Status: AC
  Filled 2023-10-27: qty 2

## 2023-10-27 MED ORDER — FENTANYL CITRATE (PF) 100 MCG/2ML IJ SOLN
INTRAMUSCULAR | Status: DC | PRN
  Administered 2023-10-27 (×2): 50 ug via INTRAVENOUS

## 2023-10-27 MED ORDER — HEPARIN SOD (PORK) LOCK FLUSH 100 UNIT/ML IV SOLN
INTRAVENOUS | Status: DC | PRN
  Administered 2023-10-27: 500 [IU] via INTRAVENOUS

## 2023-10-27 MED ORDER — FENTANYL CITRATE (PF) 100 MCG/2ML IJ SOLN
25.0000 ug | INTRAMUSCULAR | Status: DC | PRN
  Administered 2023-10-27: 50 ug via INTRAVENOUS

## 2023-10-27 MED ORDER — HEPARIN SOD (PORK) LOCK FLUSH 100 UNIT/ML IV SOLN
INTRAVENOUS | Status: AC
  Filled 2023-10-27: qty 5

## 2023-10-27 MED ORDER — BUPIVACAINE-EPINEPHRINE (PF) 0.25% -1:200000 IJ SOLN
INTRAMUSCULAR | Status: AC
  Filled 2023-10-27: qty 30

## 2023-10-27 MED ORDER — BUPIVACAINE-EPINEPHRINE 0.25% -1:200000 IJ SOLN
INTRAMUSCULAR | Status: DC | PRN
  Administered 2023-10-27: 6 mL

## 2023-10-27 MED ORDER — SODIUM CHLORIDE (PF) 0.9 % IJ SOLN
INTRAMUSCULAR | Status: DC | PRN
  Administered 2023-10-27: 20 mL via INTRAVENOUS

## 2023-10-27 MED ORDER — CEFAZOLIN SODIUM-DEXTROSE 2-4 GM/100ML-% IV SOLN
INTRAVENOUS | Status: AC
  Filled 2023-10-27: qty 100

## 2023-10-27 MED ORDER — ONDANSETRON HCL 4 MG/2ML IJ SOLN
INTRAMUSCULAR | Status: DC | PRN
  Administered 2023-10-27: 4 mg via INTRAVENOUS

## 2023-10-27 MED ORDER — ACETAMINOPHEN 10 MG/ML IV SOLN
INTRAVENOUS | Status: DC | PRN
  Administered 2023-10-27: 1000 mg via INTRAVENOUS

## 2023-10-27 MED ORDER — FENTANYL CITRATE (PF) 100 MCG/2ML IJ SOLN
INTRAMUSCULAR | Status: AC
  Filled 2023-10-27: qty 2

## 2023-10-27 MED ORDER — PHENYLEPHRINE 80 MCG/ML (10ML) SYRINGE FOR IV PUSH (FOR BLOOD PRESSURE SUPPORT)
PREFILLED_SYRINGE | INTRAVENOUS | Status: DC | PRN
  Administered 2023-10-27: 160 ug via INTRAVENOUS
  Administered 2023-10-27: 240 ug via INTRAVENOUS
  Administered 2023-10-27: 160 ug via INTRAVENOUS
  Administered 2023-10-27: 200 ug via INTRAVENOUS
  Administered 2023-10-27: 160 ug via INTRAVENOUS

## 2023-10-27 MED ORDER — ACETAMINOPHEN 10 MG/ML IV SOLN
INTRAVENOUS | Status: AC
  Filled 2023-10-27: qty 100

## 2023-10-27 MED ORDER — EPHEDRINE SULFATE-NACL 50-0.9 MG/10ML-% IV SOSY
PREFILLED_SYRINGE | INTRAVENOUS | Status: DC | PRN
  Administered 2023-10-27: 5 mg via INTRAVENOUS
  Administered 2023-10-27 (×2): 10 mg via INTRAVENOUS

## 2023-10-27 MED ORDER — CHLORHEXIDINE GLUCONATE 0.12 % MT SOLN
15.0000 mL | Freq: Once | OROMUCOSAL | Status: AC
  Administered 2023-10-27: 15 mL via OROMUCOSAL

## 2023-10-27 MED ORDER — SODIUM CHLORIDE 0.9 % IV SOLN: INTRAVENOUS | Status: DC

## 2023-10-27 MED ORDER — OXYCODONE HCL 5 MG PO TABS
ORAL_TABLET | ORAL | Status: AC
  Filled 2023-10-27: qty 4

## 2023-10-27 MED ORDER — OXYCODONE HCL 5 MG PO TABS
10.0000 mg | ORAL_TABLET | Freq: Once | ORAL | Status: AC | PRN
  Administered 2023-10-27: 10 mg via ORAL

## 2023-10-27 MED ORDER — ACETAMINOPHEN 325 MG PO TABS
650.0000 mg | ORAL_TABLET | Freq: Three times a day (TID) | ORAL | 0 refills | Status: AC | PRN
  Filled 2023-10-27: qty 40, 7d supply, fill #0

## 2023-10-27 MED ORDER — HEPARIN SODIUM (PORCINE) 5000 UNIT/ML IJ SOLN
INTRAMUSCULAR | Status: AC
  Filled 2023-10-27: qty 2

## 2023-10-27 MED ORDER — OXYCODONE HCL 5 MG/5ML PO SOLN: 5.0000 mg | Freq: Once | ORAL | Status: AC | PRN

## 2023-10-27 MED ORDER — EPHEDRINE 5 MG/ML INJ
INTRAVENOUS | Status: AC
  Filled 2023-10-27: qty 5

## 2023-10-27 MED ORDER — PHENYLEPHRINE 80 MCG/ML (10ML) SYRINGE FOR IV PUSH (FOR BLOOD PRESSURE SUPPORT)
PREFILLED_SYRINGE | INTRAVENOUS | Status: AC
  Filled 2023-10-27: qty 10

## 2023-10-27 MED ORDER — ORAL CARE MOUTH RINSE: 15.0000 mL | Freq: Once | OROMUCOSAL | Status: AC

## 2023-10-27 MED ORDER — MIDAZOLAM HCL (PF) 2 MG/2ML IJ SOLN
INTRAMUSCULAR | Status: DC | PRN
  Administered 2023-10-27: 2 mg via INTRAVENOUS

## 2023-10-27 MED ORDER — MIDAZOLAM HCL 2 MG/2ML IJ SOLN
INTRAMUSCULAR | Status: AC
  Filled 2023-10-27: qty 2

## 2023-10-27 MED ORDER — LIDOCAINE HCL (PF) 2 % IJ SOLN
INTRAMUSCULAR | Status: AC
  Filled 2023-10-27: qty 5

## 2023-10-27 MED ORDER — OXYCODONE HCL 5 MG PO TABS
20.0000 mg | ORAL_TABLET | Freq: Once | ORAL | Status: AC
  Administered 2023-10-27: 20 mg via ORAL

## 2023-10-27 MED ORDER — PROPOFOL 10 MG/ML IV BOLUS
INTRAVENOUS | Status: AC
  Filled 2023-10-27: qty 20

## 2023-10-27 MED ORDER — DEXAMETHASONE SOD PHOSPHATE PF 10 MG/ML IJ SOLN
INTRAMUSCULAR | Status: DC | PRN
  Administered 2023-10-27: 10 mg via INTRAVENOUS

## 2023-10-27 MED ADMIN — Lidocaine HCl(Cardiac) IV PF Soln Pref Syr 100 MG/5ML (2%): 80 mg | INTRAVENOUS | NDC 55150016505

## 2023-10-27 MED FILL — Chlorhexidine Gluconate Soln 0.12%: OROMUCOSAL | Qty: 15 | Status: AC

## 2023-10-27 SURGICAL SUPPLY — 25 items
BAG DECANTER FOR FLEXI CONT (MISCELLANEOUS) ×1 IMPLANT
BLADE SURG SZ11 CARB STEEL (BLADE) ×1 IMPLANT
CLAMP SUTURE YELLOW 5 PAIRS (MISCELLANEOUS) ×1 IMPLANT
COVER LIGHT HANDLE STERIS (MISCELLANEOUS) ×2 IMPLANT
DERMABOND ADVANCED .7 DNX12 (GAUZE/BANDAGES/DRESSINGS) ×1 IMPLANT
DRAPE C-ARM XRAY 36X54 (DRAPES) ×1 IMPLANT
DRSG TEGADERM 4X4.75 (GAUZE/BANDAGES/DRESSINGS) ×1 IMPLANT
ELECTRODE REM PT RTRN 9FT ADLT (ELECTROSURGICAL) ×1 IMPLANT
GAUZE 4X4 16PLY ~~LOC~~+RFID DBL (SPONGE) IMPLANT
GLOVE BIOGEL PI IND STRL 7.0 (GLOVE) ×1 IMPLANT
GLOVE SURG SYN 6.5 PF PI (GLOVE) ×3 IMPLANT
GOWN STRL REUS W/ TWL LRG LVL3 (GOWN DISPOSABLE) ×3 IMPLANT
IV 0.9% NACL 500 ML (IV SOLUTION) ×1 IMPLANT
KIT PORT INFUSION SMART 8FR (Port) ×1 IMPLANT
KIT TURNOVER KIT A (KITS) ×1 IMPLANT
LABEL OR SOLS (LABEL) ×1 IMPLANT
MANIFOLD NEPTUNE II (INSTRUMENTS) ×1 IMPLANT
PACK PORT-A-CATH (MISCELLANEOUS) ×1 IMPLANT
SPIKE FLUID TRANSFER (MISCELLANEOUS) ×1 IMPLANT
SUT MNCRL AB 4-0 PS2 18 (SUTURE) ×1 IMPLANT
SUT PROLENE 2 0 SH DA (SUTURE) ×1 IMPLANT
SUT VIC AB 3-0 SH 27X BRD (SUTURE) ×1 IMPLANT
SYR 10ML LL (SYRINGE) ×1 IMPLANT
TRAP FLUID SMOKE EVACUATOR (MISCELLANEOUS) ×1 IMPLANT
WATER STERILE IRR 500ML POUR (IV SOLUTION) ×1 IMPLANT

## 2023-10-27 NOTE — Anesthesia Preprocedure Evaluation (Addendum)
 Anesthesia Evaluation  Patient identified by MRN, date of birth, ID band Patient awake    Reviewed: Allergy & Precautions, NPO status , Patient's Chart, lab work & pertinent test results  History of Anesthesia Complications (+) PONV, POST - OP SPINAL HEADACHE and history of anesthetic complications  Airway Mallampati: III  TM Distance: >3 FB Neck ROM: full    Dental  (+) Upper Dentures, Lower Dentures   Pulmonary neg pulmonary ROS   Pulmonary exam normal        Cardiovascular + Peripheral Vascular Disease and +CHF  Normal cardiovascular exam  Echo 2025 IMPRESSIONS     1. Left ventricular ejection fraction, by estimation, is 50 to 55%. The  left ventricle has low normal function. The left ventricle has no regional  wall motion abnormalities. Left ventricular diastolic parameters are  consistent with Grade I diastolic  dysfunction (impaired relaxation).   2. Right ventricular systolic function is normal. The right ventricular  size is normal. There is normal pulmonary artery systolic pressure. The  estimated right ventricular systolic pressure is 12.4 mmHg.   3. Left atrial size was mildly dilated.   4. The mitral valve is normal in structure. Mild to moderate mitral valve  regurgitation. No evidence of mitral stenosis.   5. The aortic valve is normal in structure. There is mild calcification  of the aortic valve. Aortic valve regurgitation is not visualized. Aortic  valve sclerosis/calcification is present, without any evidence of aortic  stenosis.   6. The inferior vena cava is normal in size with greater than 50%  respiratory variability, suggesting right atrial pressure of 3 mmHg.     Neuro/Psych  Headaches PSYCHIATRIC DISORDERS   Bipolar Disorder    Neuromuscular disease    GI/Hepatic Neg liver ROS,GERD  ,,  Endo/Other  diabetes    Renal/GU      Musculoskeletal  (+) Arthritis , Osteoarthritis,  Fibromyalgia  -, narcotic dependent  Abdominal   Peds  Hematology  (+) Blood dyscrasia, anemia   Anesthesia Other Findings Past Medical History: No date: (HFpEF) heart failure with preserved ejection fraction (HCC) 08/25/2023: Adenocarcinoma, colon (HCC) No date: Anemia No date: Anxiety No date: Aortic atherosclerosis No date: Arthritis No date: Avascular necrosis of bones of both hips (HCC) No date: Bipolar disorder (HCC) No date: Bladder spasms No date: Chronic pain of left knee No date: Chronic pain syndrome     Comment:  a.) followed by pain clinic in [place] No date: Chronic, continuous use of opioids     Comment:  a.) managed by pain clinic in [place] No date: DDD (degenerative disc disease) No date: Depression No date: Erectile dysfunction     Comment:  a.) on PDE5i (tadalafil) No date: Frequency-urgency syndrome No date: GERD (gastroesophageal reflux disease) 2018: Hepatitis C No date: History of anesthesia complications     Comment:  a.) PONV; b.) PDPH; c.) difficult to sedate No date: History of hepatitis B     Comment:  1983  TX'D--  NO ISSUES OR SYMPTOMS SINCE No date: Hyperlipidemia No date: Hypotestosteronemia No date: Insomnia     Comment:  a.) on hypnotic PRN (zolpidem ) No date: Multiple lung nodules on CT     Comment:  a.) CT CAP 08/25/2023: along oblique fissures - favored               to be benign 09/08/2015: 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HCC) No date: Neurogenic bladder No date: Nocturia No date: PAD (peripheral artery disease) No date: Polysubstance abuse (HCC)  Comment:  a.) cocaine + marijuana + BZO + opioids No date: PONV (postoperative nausea and vomiting) No date: Spinal headache No date: T2DM (type 2 diabetes mellitus) (HCC) No date: Urine incontinence  Past Surgical History: 09/11/2021: APPLICATION OF WOUND VAC; Left     Comment:  Procedure: APPLICATION OF WOUND VAC;  Surgeon: [name], MD;  Location: [facility];  Service: Orthopedics;                Laterality: Left;  [id number]  No date: BACK SURGERY     Comment:  2 rods and 4 screws artificial disc 2008: COLONOSCOPY 08/25/2023: COLONOSCOPY; N/A     Comment:  Procedure: COLONOSCOPY;  Surgeon: [name], MD;                Location: [facility];  Service: Endoscopy;                Laterality: N/A; 08/25/2023: ESOPHAGOGASTRODUODENOSCOPY; N/A     Comment:  Procedure: EGD (ESOPHAGOGASTRODUODENOSCOPY);  Surgeon:               [name], MD;  Location: [facility];  Service:               Endoscopy;  Laterality: N/A; 2007: INTERSTIM IMPLANT PLACEMENT 08/17/2012: INTERSTIM IMPLANT REVISION; N/A     Comment:  Procedure: REPLACMENT OF IPG PLUS REPLACE LEAD OF               INTERSTIM IMPLANT ;  Surgeon: [name], MD;                Location: [facility];  Service: Urology;               Laterality: N/A; 09/11/2021: IRRIGATION AND DEBRIDEMENT KNEE; Left     Comment:  Procedure: IRRIGATION AND DEBRIDEMENT WITH POLY EXCHANGE              LEFT KNEE;  Surgeon: [name], MD;  Location: [facility]              ORS;  Service: Orthopedics;  Laterality: Left; 06/01/2022: IRRIGATION AND DEBRIDEMENT KNEE; Left     Comment:  Procedure: IRRIGATION AND DEBRIDEMENT KNEE;  Surgeon:               [name], MD;  Location: [facility];  Service:               Orthopedics;  Laterality: Left; 11/17/2020: LOWER EXTREMITY ANGIOGRAPHY; Left     Comment:  Procedure: LOWER EXTREMITY ANGIOGRAPHY;  Surgeon:               [name], MD;  Location: [facility];               Service: Cardiovascular;  Laterality: Left; 12/22/2020: LOWER EXTREMITY ANGIOGRAPHY; Right     Comment:  Procedure: LOWER EXTREMITY ANGIOGRAPHY;  Surgeon:               [name], MD;  Location: [facility];               Service: Cardiovascular;  Laterality: Right; 12/14/2021: LOWER EXTREMITY ANGIOGRAPHY; Left     Comment:  Procedure: Lower Extremity Angiography;   Surgeon:               [name], MD;  Location: [facility];  Service: Cardiovascular;  Laterality: Left; 2005: LUMBAR DISC SURGERY     Comment:  L5 X2  2006  &  2007: LUMBAR FUSION 08/2002: MANIPULATION KNEE JOINT; Left 05/01/2007: OPEN DEBRIDEMENT LEFT TOTAL KNEE (SCAR, BONEY GRAOWTH)/  REMOVAL OLD SUTURES 2001: PARTIAL HIP ARTHROPLASTY; Left     Comment:  x2 2000: SHOULDER OPEN ROTATOR CUFF REPAIR; Left 2002: TOTAL HIP ARTHROPLASTY; Left 06/2002: TOTAL KNEE ARTHROPLASTY; Left     Comment:  PARTIAL LEFT KNEE REPLACEMENT PRIOR TO THIS 2008: TOTAL KNEE REVISION; Left 07/19/2021: TOTAL KNEE REVISION; Left     Comment:  Procedure: TOTAL KNEE REVISION;  Surgeon: [name], MD;  Location: [facility];  Service: Orthopedics;                Laterality: Left; 2005: TOTAL KNEE REVISION; Left 06/01/2022: TOTAL KNEE REVISION; Left     Comment:  Procedure: TOTAL KNEE REVISION;  Surgeon: [name], MD;  Location: [facility];  Service: Orthopedics;                Laterality: Left; 04/01/2022: TOTAL KNEE REVISION; Left     Comment:  Procedure: Removal of left total knee implants with               insertion of spacer.;  Surgeon: [name], MD;                Location: [facility];  Service: Orthopedics;  Laterality:               Left; No date: TRIGGER FINGER RELEASE; Bilateral     Comment:  SEVERAL FINGERS 06/18/2014: TRIGGER FINGER RELEASE; Right     Comment:  Procedure: RELEASE TRIGGER FINGER/A-1 PULLEY;  Surgeon:               Lynwood SQUIBB Mardee, MD;  Location: [facility];  Service:               Orthopedics;  Laterality: Right;     Reproductive/Obstetrics negative OB ROS                              Anesthesia Physical Anesthesia Plan  ASA: 3  Anesthesia Plan: General LMA   Post-op Pain Management: Toradol  IV (intra-op)* and Ofirmev  IV (intra-op)*   Induction: Intravenous  PONV Risk  Score and Plan: 3 and Dexamethasone , Ondansetron , Midazolam  and Treatment may vary due to age or medical condition  Airway Management Planned: LMA  Additional Equipment:   Intra-op Plan:   Post-operative Plan: Extubation in OR  Informed Consent: I have reviewed the patients History and Physical, chart, labs and discussed the procedure including the risks, benefits and alternatives for the proposed anesthesia with the patient or authorized representative who has indicated his/her understanding and acceptance.     Dental Advisory Given  Plan Discussed with: Anesthesiologist, CRNA and Surgeon  Anesthesia Plan Comments: (Patient consented for risks of anesthesia including but not limited to:  - adverse reactions to medications - damage to eyes, teeth, lips or other oral mucosa - nerve damage due to positioning  - sore throat or hoarseness - Damage to heart, brain, nerves, lungs, other parts of body or loss of life  Patient voiced understanding and assent.)  Anesthesia Quick Evaluation

## 2023-10-27 NOTE — Op Note (Signed)
--  OP NOTE  DATE OF PROCEDURE: 10/27/2023   SURGEON: Tye  ANESTHESIA: LMA  PRE-OPERATIVE DIAGNOSIS:: Cancer requring port for chemotherapy   POST-OPERATIVE DIAGNOSIS: same  PROCEDURE(S):  1.) Percutaneous access of right subclavian vein   2.) Insertion of tunneled right subclavian central venous catheter with subcutaneous port  INTRAOPERATIVE FINDINGS:  1)Patent easily compressible right IJ vein with ultrasound.  However, unable to obtain access, switched to subclavian vein approach.    2)well-secured tunneled central venous catheter with subcutaneous port at completion of the procedure, heplocked after confirming ease of draw and push  ESTIMATED BLOOD LOSS: Minimal (<20 mL)   SPECIMENS: None   IMPLANTS: 49F tunneled Bard PowerPort central venous catheter with subcutaneous port  DRAINS: None   COMPLICATIONS: None apparent   CONDITION AT COMPLETION: Hemodynamically stable, awake   DISPOSITION: PACU   INDICATION(S) FOR PROCEDURE:  Patient is a 68 y.o. male who presented with above diagnosis.  All risks, benefits, and alternatives to above elective procedures were discussed with the patient, who elected to proceed, and informed consent was accordingly obtained at that time.  DETAILS OF PROCEDURE:  Patient was brought to the operative suite and appropriately identified. In Trendelenburg position, neck and shoulders venous access site prepped and draped in the usual sterile fashion, and following a timeout, ultrasound used to visualize the compressible right IJ vein adjacent to a pulsatile right carotid.  However, access unable to be obtained with inability to draw back venous blood despite direct visualization of the needle that seem to be entering the vein under ultrasound guidance.  After couple more attempts, decision made to abort and approach via subclavian area.  No hematoma formation or excess bleeding noted at the IJ sites.  Right subclavian vein accessed via anatomical  landmarks using Seldinger technique, by which local anesthetic was injected over the area. soft guidewire was advanced with no resistance, confirmed via fluoroscopy to be within atriocaval junction, Access needle was withdrawn. Guidewire was secured, local infused, 2-3 cm transverse right chest incision was made around guidewire, and confirmed to accommodate the subcutaneous port.  Insertion sheath was advanced over the guidewire, which was withdrawn along with the insertion sheath dilator with no resistance. The catheter was introduced through the sheath and tip left in the Atrio Caval junction under fluoro guidance and catheter cut to appropriate length.  Catheter connected to port and placed within planned fixation site.  Port fixed using 2-0 Prolene to the pocket on lateral aspect and around the plastic sheet at the catheter attachment site.   Port was confirmed to withdraw blood and flush easily with included Heuber needle, and port heplocked. Dermis at the subcutaneous pocket was re-approximated using buried interrupted 3-0 Vicryl suture, and 4-0 Monocryl suture was used to re-approximate skin. Incision then dressed with dermabond. Patient was then awakened from anesthesia and transferred to PACU in stable condition.  fluoro images saved in Epic.  CXR post op confirmed proper placement of port and no evidence of pneumothorax.

## 2023-10-27 NOTE — Interval H&P Note (Signed)
 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  The various methods of treatment have been discussed with the patient and family. After consideration of risks, benefits and other options for treatment, the patient has consented to  Procedure(s): INSERTION, TUNNELED CENTRAL VENOUS DEVICE, WITH PORT (N/A) as a surgical intervention.  The patient's history has been reviewed, patient examined, no change in status, stable for surgery.  I have reviewed the patient's chart and labs.  Questions were answered to the patient's satisfaction.     [name]

## 2023-10-27 NOTE — Anesthesia Procedure Notes (Signed)
 Procedure Name: LMA Insertion Date/Time: 10/27/2023 12:10 PM  Performed by: Lorrene Camelia LABOR, CRNAPre-anesthesia Checklist: Patient identified, Patient being monitored, Timeout performed, Emergency Drugs available and Suction available Patient Re-evaluated:Patient Re-evaluated prior to induction Oxygen Delivery Method: Circle system utilized Preoxygenation: Pre-oxygenation with 100% oxygen Induction Type: IV induction Ventilation: Mask ventilation without difficulty LMA: LMA inserted LMA Size: 4.0 Tube type: Oral Number of attempts: 1 Placement Confirmation: positive ETCO2 and breath sounds checked- equal and bilateral Tube secured with: Tape Dental Injury: Teeth and Oropharynx as per pre-operative assessment

## 2023-10-27 NOTE — Transfer of Care (Signed)
 Immediate Anesthesia Transfer of Care Note  Patient: Erik Martinez  Procedure(s) Performed: INSERTION, TUNNELED CENTRAL VENOUS DEVICE, WITH PORT (Chest)  Patient Location: PACU  Anesthesia Type:General  Level of Consciousness: awake and patient cooperative  Airway & Oxygen Therapy: Patient Spontanous Breathing and Patient connected to face mask oxygen  Post-op Assessment: Report given to RN and Post -op Vital signs reviewed and stable  Post vital signs: Reviewed and stable  Last Vitals:  Vitals Value Taken Time  BP 112/57 10/27/23 13:18  Temp 36.2 C 10/27/23 13:18  Pulse 88 10/27/23 13:18  Resp 19 10/27/23 13:18  SpO2 100 % 10/27/23 13:18    Last Pain:  Vitals:   10/27/23 1318  TempSrc: Tympanic  PainSc: 0-No pain         Complications: No notable events documented.

## 2023-10-27 NOTE — Anesthesia Postprocedure Evaluation (Signed)
 Anesthesia Post Note  Patient: Erik Martinez  Procedure(s) Performed: INSERTION, TUNNELED CENTRAL VENOUS DEVICE, WITH PORT (Chest)  Patient location during evaluation: PACU Anesthesia Type: General Level of consciousness: awake and alert Pain management: pain level controlled Vital Signs Assessment: post-procedure vital signs reviewed and stable Respiratory status: spontaneous breathing, nonlabored ventilation, respiratory function stable and patient connected to nasal cannula oxygen Cardiovascular status: blood pressure returned to baseline and stable Postop Assessment: no apparent nausea or vomiting Anesthetic complications: no   No notable events documented.   Last Vitals:  Vitals:   10/27/23 1346 10/27/23 1354  BP: 118/70   Pulse: 86 83  Resp: 14 13  Temp: (!) 36.2 C   SpO2: 100% 97%    Last Pain:  Vitals:   10/27/23 1346  TempSrc:   PainSc: 6                  Lendia LITTIE Mae

## 2023-10-27 NOTE — Discharge Instructions (Signed)
 Port placement, Care After This sheet gives you information about how to care for yourself after your procedure. Your health care provider may also give you more specific instructions. If you have problems or questions, contact your health care provider. What can I expect after the procedure? After the procedure, it is common to have: Soreness. Bruising. Itching. Follow these instructions at home: site care Follow instructions from your health care provider about how to take care of your site. Make sure you: Wash your hands with soap and water  before and after you change your bandage (dressing). If soap and water  are not available, use hand sanitizer. Leave stitches (sutures), skin glue, or adhesive strips in place. These skin closures may need to stay in place for 2 weeks or longer. If adhesive strip edges start to loosen and curl up, you may trim the loose edges. Do not remove adhesive strips completely unless your health care provider tells you to do that. If the area bleeds or bruises, apply gentle pressure for 10 minutes. OK TO SHOWER IN 24HRS OK TO USE PORT  Check your site every day for signs of infection. Check for: Redness, swelling, or pain. Fluid or blood. Warmth. Pus or a bad smell.  General instructions Rest and then return to your normal activities as told by your health care provider.  tylenol  and advil as needed for discomfort.  Please alternate between the two every four hours as needed for pain.    Use narcotics, if prescribed, only when tylenol  and motrin is not enough to control pain.  325-650mg  every 8hrs to max of 3000mg /24hrs (including the 325mg  in every norco dose) for the tylenol .    Advil up to 800mg  per dose every 8hrs as needed for pain.   Keep all follow-up visits as told by your health care provider. This is important. Contact a health care provider if: You have redness, swelling, or pain around your site. You have fluid or blood coming from your  site. Your site feels warm to the touch. You have pus or a bad smell coming from your site. You have a fever. Your sutures, skin glue, or adhesive strips loosen or come off sooner than expected. Get help right away if: You have bleeding that does not stop with pressure or a dressing. Summary After the procedure, it is common to have some soreness, bruising, and itching at the site. Follow instructions from your health care provider about how to take care of your site. Check your site every day for signs of infection. Contact a health care provider if you have redness, swelling, or pain around your site, or your site feels warm to the touch. Keep all follow-up visits as told by your health care provider. This is important. This information is not intended to replace advice given to you by your health care provider. Make sure you discuss any questions you have with your health care provider. Document Released: 01/23/2015 Document Revised: 06/26/2017 Document Reviewed: 06/26/2017 Elsevier Interactive Patient Education  [employer].

## 2023-10-27 NOTE — H&P (Signed)
 Subjective:   CC: colon CA  HPI:  Erik Martinez is a 68 y.o. male with above.  S/p resection, onc now recommending chemo, here for port placement.  Past Medical History:  has a past medical history of (HFpEF) heart failure with preserved ejection fraction (HCC), Adenocarcinoma, colon (HCC) (08/25/2023), Anemia, Anxiety, Aortic atherosclerosis, Arthritis, Avascular necrosis of bones of both hips (HCC), Bipolar disorder (HCC), Bladder spasms, Chronic pain of left knee, Chronic pain syndrome, Chronic, continuous use of opioids, DDD (degenerative disc disease), Depression, Erectile dysfunction, Frequency-urgency syndrome, GERD (gastroesophageal reflux disease), Hepatitis C (2018), History of anesthesia complications, History of hepatitis B, Hyperlipidemia, Hypotestosteronemia, Insomnia, Multiple lung nodules on CT, 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  Past Surgical History:  has a past surgical history that includes Total knee arthroplasty (Left, 06/2002); Total knee revision (Left, 2008); OPEN DEBRIDEMENT LEFT TOTAL KNEE (SCAR, BONEY GRAOWTH)/ REMOVAL OLD SUTURES (05/01/2007); Interstim Implant placement (2007); Partial hip arthroplasty (Left, 2001); Total hip arthroplasty (Left, 2002); Shoulder open rotator cuff repair (Left, 2000); Trigger finger release (Bilateral); Lumbar disc surgery (2005); Lumbar fusion (X2  2006  &  2007); Interstim implant revision (N/A, 08/17/2012); Back surgery; Trigger finger release (Right, 06/18/2014); Lower Extremity Angiography (Left, 11/17/2020); Lower Extremity Angiography (Right, 12/22/2020); Total knee revision (Left, 07/19/2021); Irrigation and debridement knee (Left, 09/11/2021); Application if wound vac (Left, 09/11/2021); Lower Extremity Angiography (Left,  12/14/2021); Manipulation knee joint (Left, 08/2002); Total knee revision (Left, 2005); Colonoscopy (2008); Total knee revision (Left, 06/01/2022); Irrigation and debridement knee (Left, 06/01/2022); Total knee revision (Left, 04/01/2022); Colonoscopy (N/A, 08/25/2023); and Esophagogastroduodenoscopy (N/A, 08/25/2023).  Family History: family history includes Alcohol abuse in his father; Arthritis in his father and mother; Cancer in his father and mother; Diabetes in his brother and mother; Hyperlipidemia in his mother; Stroke in his mother.  Social History:  reports that he has never smoked. He has never used smokeless tobacco. He reports that he does not currently use alcohol. He reports that he does not currently use drugs after having used the following drugs: Cocaine.  Current Medications:  Prior to Admission medications   Medication Sig Start Date End Date Taking? Authorizing Provider  acetaminophen  (TYLENOL ) 325 MG tablet Take 2 tablets (650 mg total) by mouth every 8 (eight) hours as needed for mild pain (pain score 1-3). 10/27/23 11/26/23 Yes Janaiah Vetrano, DO  atorvastatin  (LIPITOR) 20 MG tablet Take 20 mg by mouth at bedtime.   Yes [provider]  celecoxib  (CELEBREX ) 200 MG capsule Take 1 capsule (200 mg total) by mouth 2 (two) times daily. 09/01/23  Yes Bedsole, Amy E, MD  cyclobenzaprine  (FLEXERIL ) 10 MG tablet Take 10 mg by mouth 3 (three) times daily. 05/04/21  Yes [provider]  empagliflozin  (JARDIANCE ) 25 MG TABS tablet Take 1 tablet (25 mg total) by mouth daily. 01/20/21  Yes [name], Rosaline GORMAN, FNP  furosemide  (LASIX ) 20 MG tablet Take 3 tablets (60 mg total) by mouth daily. 09/04/23 12/03/23 Yes Donette Ellouise LABOR, FNP  insulin  aspart (NOVOLOG ) 100 UNIT/ML injection Per sliding scale  MAX 15 units in 24 hour period. Patient taking differently: Inject into the skin. Per sliding scale  MAX 15 units in 24 hour period. 10/27/22  Yes Bedsole, Amy E, MD  lubiprostone   (AMITIZA ) 8 MCG capsule Take 1 capsule (8 mcg total) by mouth 3 (three) times daily. 10/17/23  Yes Bedsole, Amy E, MD  Oxycodone  HCl 20 MG TABS Take 1 tablet by mouth every 4 (four) hours as needed (Pain).   Yes [provider]  pregabalin  (LYRICA ) 200 MG capsule Take 1 capsule (200 mg total) by mouth in the morning, at noon, and at bedtime. 11/24/21  Yes Bedsole, Amy E, MD  Semaglutide , 2 MG/DOSE, (OZEMPIC , 2 MG/DOSE,) 8 MG/3ML SOPN Inject 2 mg into the skin once a week. Monday 09/01/23  Yes Bedsole, Amy E, MD  spironolactone  (ALDACTONE ) 25 MG tablet Take 1 tablet (25 mg total) by mouth daily. 08/28/23 11/26/23 Yes Donette [place] A, FNP  tadalafil (CIALIS) 20 MG tablet Take 20 mg by mouth daily as needed for erectile dysfunction.   Yes [provider]  terbinafine  (LAMISIL ) 250 MG tablet TAKE 1 TABLET BY MOUTH ONCE DAILY 10/16/23  Yes Janit Thresa HERO, DPM  triamcinolone  cream (KENALOG ) 0.5 % Apply 1 Application topically 2 (two) times daily. 10/24/23  Yes Bedsole, Amy E, MD  zolpidem  (AMBIEN ) 10 MG tablet Take 1 tablet (10 mg total) by mouth at bedtime. 10/17/23  Yes Bedsole, Amy E, MD  Continuous Glucose Sensor (FREESTYLE LIBRE 3 PLUS SENSOR) MISC CHANCE SENSOR EVERY 15 DAYS 08/02/23   Bedsole, Amy E, MD  iron  polysaccharides (NIFEREX) 150 MG capsule Take 1 capsule (150 mg total) by mouth daily. 08/26/23   Barbarann Nest, MD  lidocaine  (LIDODERM ) 5 % Place 3 patches onto the skin daily. Remove & Discard patch within 12 hours or as directed by MD Patient taking differently: Place 3 patches onto the skin as needed. Remove & Discard patch within 12 hours or as directed by MD 07/18/23   [name], MD  lidocaine -prilocaine (EMLA) cream Apply to affected area once 10/25/23   Rao, Archana C, MD  metolazone  (ZAROXOLYN ) 2.5 MG tablet Take Metolazone  2.5 MG on Tuesday, Wednesday, Thursday 1/2 hour before you take your Lasix . Patient not taking: Reported on 10/27/2023 09/04/23   Donette City LABOR,  FNP  ondansetron  (ZOFRAN ) 8 MG tablet Take 1 tablet (8 mg total) by mouth every 8 (eight) hours as needed for nausea or vomiting. 10/25/23   [name], MD  pantoprazole  (PROTONIX ) 40 MG tablet Take 1 tablet (40 mg total) by mouth daily. Patient taking differently: Take 40 mg by mouth every morning. 11/17/20   Schnier, Cordella MATSU, MD  polyethylene glycol (MIRALAX  / GLYCOLAX ) 17 g packet Take 17 g by mouth daily as needed for mild constipation. 08/25/23   Barbarann Nest, MD  prochlorperazine (COMPAZINE) 10 MG tablet Take 1 tablet (10 mg total) by mouth every 6 (six) hours as needed for nausea or vomiting. 10/25/23   [name], MD    Allergies:  Allergies as of 10/24/2023 - Review Complete 10/24/2023  Allergen Reaction Noted   Suboxone [buprenorphine hcl-naloxone  hcl] Nausea And Vomiting 07/15/2016   Tape Other (See Comments) 10/11/2016   Codeine Rash, Itching, and Nausea Only 08/10/2012   Morphine  and codeine Itching and Rash 08/10/2012   Other Rash 07/15/2016   Silicone Rash 06/06/2013    ROS:  Pertinent positives and negatives noted in HPI   Objective:     BP 138/85   Pulse 97   Temp (!) 97 F (36.1 C) (Temporal)   Resp 14   Ht 5' 8 (1.727 m)   Wt 80.3 kg   SpO2 100%   BMI 26.92 kg/m    Constitutional :  alert, cooperative, appears stated age, and no distress  Respiratory:  Clear to auscultation bilaterally  Cardiovascular:  Regular rate and rhythm  Gastrointestinal: soft, non-tender; bowel sounds normal; no masses,  no organomegaly.   Skin: Cool and moist  Psychiatric: Normal affect, non-agitated, not confused       LABS:     Latest Ref Rng & Units 10/27/2023   11:20 AM 09/21/2023    5:34 AM 09/20/2023    5:30 AM  CMP  Glucose 70 - 99 mg/dL 852  855  862   BUN 8 - 23 mg/dL 16  15  13    Creatinine 0.61 - 1.24 mg/dL 9.09  9.36  9.14   Sodium 135 - 145 mmol/L 139  137  135   Potassium 3.5 - 5.1 mmol/L 3.7  4.5  3.9   Chloride 98 - 111 mmol/L 103  103  102    CO2 22 - 32 mmol/L  25  27   Calcium  8.9 - 10.3 mg/dL  8.5  8.1   Total Protein 6.5 - 8.1 g/dL  6.1  5.9   Total Bilirubin 0.0 - 1.2 mg/dL  0.5  0.6   Alkaline Phos 38 - 126 U/L  46  47   AST 15 - 41 U/L  16  14   ALT 0 - 44 U/L  7  8       Latest Ref Rng & Units 10/27/2023   11:20 AM 09/21/2023    5:34 AM 09/18/2023    3:47 AM  CBC  WBC 4.0 - 10.5 K/uL  6.6  8.3   Hemoglobin 13.0 - 17.0 g/dL 89.7  9.5  8.5   Hematocrit 39.0 - 52.0 % 30.0  31.8  29.5   Platelets 150 - 400 K/uL  264  240      RADS: N/a  Assessment:     Colon CA, recommended chemo, here for port placement   Plan:     Risk alternative benefits discussed.  Risks include bleeding, infection, dislocation, migration, malfunction, unsuccessful placement, pneumothorax, and additional procedures to address that risks.  Benefits include initiation of chemotherapy.  Alternative includes non-IV infusion therapy.  We discussed the need for the port to infuse IV chemotherapy agents, and how the port can be a temporary and/or permanent option depending on patient preference after completion of chemotherapy.  Port will be okay to use as soon as it is placed.  Patient verbalized understanding and all questions and concerns addressed.  labs/images/medications/previous chart entries reviewed personally and relevant changes/updates noted above.

## 2023-10-28 NOTE — Progress Notes (Unsigned)
 Hematology/Oncology Consult note [facility]  [phone] [phone]  Patient Care Team: [name], MD as PCP - General (Family Medicine) Fate Morna SAILOR, [place] (Inactive) as Pharmacist (Pharmacist) Maurie Rayfield BIRCH, RN as Oncology Nurse Navigator [name], MD as Consulting Physician (Oncology)   Name of the patient: Erik Martinez  [id number]  07/14/1955   Date of visit: 10/28/23  Diagnosis- ***  Chief complaint/ Reason for visit- ***  Heme/Onc history: ***  Interval history- ***  ECOG PS- *** Pain scale- *** Opioid associated constipation- ***  Review of systems- ROS    Allergies  Allergen Reactions   Suboxone [Buprenorphine Hcl-Naloxone  Hcl] Nausea And Vomiting   Tape Other (See Comments)    Whelps *Paper Tape is ok    Codeine Rash, Itching and Nausea Only   Morphine  And Codeine Itching and Rash   Other Rash    Telemetry electrodes   Silicone Rash    Whelps -*Paper Tape is ok      Past Medical History:  Diagnosis Date   (HFpEF) heart failure with preserved ejection fraction (HCC)    Adenocarcinoma, colon (HCC) 08/25/2023   Anemia    Anxiety    Aortic atherosclerosis    Arthritis    Avascular necrosis of bones of both hips (HCC)    Bipolar disorder (HCC)    Bladder spasms    Chronic pain of left knee    Chronic pain syndrome    a.) followed by pain clinic in [place]   Chronic, continuous use of opioids    a.) managed by pain clinic in [place]   DDD (degenerative disc disease)    Depression    Erectile dysfunction    a.) on PDE5i (tadalafil)   Frequency-urgency syndrome    GERD (gastroesophageal reflux disease)    Hepatitis C 2018   History of anesthesia complications    a.) PONV; b.) PDPH; c.) difficult to sedate   History of hepatitis B    1983  TX'D--  NO ISSUES OR SYMPTOMS SINCE   Hyperlipidemia    Hypotestosteronemia    Insomnia    a.) on hypnotic PRN (zolpidem )   Multiple lung  nodules on CT    a.) CT CAP 08/25/2023: along oblique fissures - favored to be benign   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   Neurogenic bladder    Nocturia    PAD (peripheral artery disease)    Polysubstance abuse (HCC)    a.) cocaine + marijuana + BZO + opioids   PONV (postoperative nausea and vomiting)    Spinal headache    T2DM (type 2 diabetes mellitus) (HCC)    Urine incontinence      Past Surgical History:  Procedure Laterality Date   APPLICATION OF WOUND VAC Left 09/11/2021   Procedure: APPLICATION OF WOUND VAC;  Surgeon: Hooten, James P, MD;  Location: [facility] ORS;  Service: Orthopedics;  Laterality: Left;  [id number]    BACK SURGERY     2 rods and 4 screws artificial disc   COLONOSCOPY  2008   COLONOSCOPY N/A 08/25/2023   Procedure: COLONOSCOPY;  Surgeon: [name], MD;  Location: [facility] ENDOSCOPY;  Service: Endoscopy;  Laterality: N/A;   ESOPHAGOGASTRODUODENOSCOPY N/A 08/25/2023   Procedure: EGD (ESOPHAGOGASTRODUODENOSCOPY);  Surgeon: [name], MD;  Location: [facility] ENDOSCOPY;  Service: Endoscopy;  Laterality: N/A;   INTERSTIM IMPLANT PLACEMENT  2007   INTERSTIM IMPLANT REVISION N/A 08/17/2012   Procedure: REPLACMENT OF IPG PLUS REPLACE LEAD OF INTERSTIM  IMPLANT ;  Surgeon: [name], MD;  Location: [facility];  Service: Urology;  Laterality: N/A;   IRRIGATION AND DEBRIDEMENT KNEE Left 09/11/2021   Procedure: IRRIGATION AND DEBRIDEMENT WITH POLY EXCHANGE LEFT KNEE;  Surgeon: [name], MD;  Location: [facility];  Service: Orthopedics;  Laterality: Left;   IRRIGATION AND DEBRIDEMENT KNEE Left 06/01/2022   Procedure: IRRIGATION AND DEBRIDEMENT KNEE;  Surgeon: [name], MD;  Location: [facility];  Service: Orthopedics;  Laterality: Left;   LOWER EXTREMITY ANGIOGRAPHY Left 11/17/2020   Procedure: LOWER EXTREMITY ANGIOGRAPHY;  Surgeon: [name], MD;  Location: [facility] IN[facility];  Service: Cardiovascular;  Laterality: Left;   LOWER  EXTREMITY ANGIOGRAPHY Right 12/22/2020   Procedure: LOWER EXTREMITY ANGIOGRAPHY;  Surgeon: [name], MD;  Location: [facility];  Service: Cardiovascular;  Laterality: Right;   LOWER EXTREMITY ANGIOGRAPHY Left 12/14/2021   Procedure: Lower Extremity Angiography;  Surgeon: [name], MD;  Location: [facility];  Service: Cardiovascular;  Laterality: Left;   LUMBAR DISC SURGERY  2005   L5   LUMBAR FUSION  X2  2006  &  2007   MANIPULATION KNEE JOINT Left 08/2002   OPEN DEBRIDEMENT LEFT TOTAL KNEE (SCAR, BONEY GRAOWTH)/ REMOVAL OLD SUTURES  05/01/2007   PARTIAL HIP ARTHROPLASTY Left 2001   x2   SHOULDER OPEN ROTATOR CUFF REPAIR Left 2000   TOTAL HIP ARTHROPLASTY Left 2002   TOTAL KNEE ARTHROPLASTY Left 06/2002   PARTIAL LEFT KNEE REPLACEMENT PRIOR TO THIS   TOTAL KNEE REVISION Left 2008   TOTAL KNEE REVISION Left 07/19/2021   Procedure: TOTAL KNEE REVISION;  Surgeon: [name], MD;  Location: [facility];  Service: Orthopedics;  Laterality: Left;   TOTAL KNEE REVISION Left 2005   TOTAL KNEE REVISION Left 06/01/2022   Procedure: TOTAL KNEE REVISION;  Surgeon: [name], MD;  Location: [facility];  Service: Orthopedics;  Laterality: Left;   TOTAL KNEE REVISION Left 04/01/2022   Procedure: Removal of left total knee implants with insertion of spacer.;  Surgeon: [name], MD;  Location: [facility];  Service: Orthopedics;  Laterality: Left;   TRIGGER FINGER RELEASE Bilateral    SEVERAL FINGERS   TRIGGER FINGER RELEASE Right 06/18/2014   Procedure: RELEASE TRIGGER FINGER/A-1 PULLEY;  Surgeon: Lynwood SQUIBB Mardee, MD;  Location: [facility];  Service: Orthopedics;  Laterality: Right;    Social History   Socioeconomic History   Marital status: Divorced    Spouse name: Not on file   Number of children: 3   Years of education: Not on file   Highest education level: Some college, no degree  Occupational History   Not on file  Tobacco Use   Smoking  status: Never   Smokeless tobacco: Never  Vaping Use   Vaping status: Never Used  Substance and Sexual Activity   Alcohol use: Not Currently    Alcohol/week: 0.0 - 2.0 standard drinks of alcohol   Drug use: Not Currently    Types: Cocaine    Comment: last + cocaine UDS 07/2016   Sexual activity: Not Currently    Birth control/protection: None  Other Topics Concern   Not on file  Social History Narrative    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   Overall Financial Resource Strain (CARDIA)  Difficulty of Paying Living Expenses: Not hard at all  Food Insecurity: No Food Insecurity (09/26/2023)   Hunger Vital Sign    Worried About Running Out of Food in the Last Year: Never true    Ran Out of Food in the Last Year: Never true  Recent Concern: Food Insecurity - Food Insecurity Present (07/11/2023)   Received from [facility]   Hunger Vital Sign    Within the past 12 months, you worried that your food would run out before you got the money to buy more.: Sometimes true    Within the past 12 months, the food you bought just didn't last and you didn't have money to get more.: Never true  Transportation Needs: No Transportation Needs (09/26/2023)   PRAPARE - Administrator, Civil Service (Medical): No    Lack of Transportation (Non-Medical): No  Physical Activity: Inactive (01/28/2022)   Exercise Vital Sign    Days of Exercise per Week: 0 days    Minutes of Exercise per Session: 0 min  Stress: No Stress Concern Present (01/28/2022)   [employer] of Occupational Health - Occupational Stress Questionnaire    Feeling of Stress : Not at all  Social Connections: Moderately Isolated (09/13/2023)   Social Connection and Isolation Panel    Frequency of Communication with Friends and Family: More than three times a week    Frequency of Social  Gatherings with Friends and Family: More than three times a week    Attends Religious Services: More than 4 times per year    Active Member of [employer] or Organizations: No    Attends Banker Meetings: Never    Marital Status: Divorced  Catering manager Violence: Not At Risk (09/26/2023)   Humiliation, Afraid, Rape, and Kick questionnaire    Fear of Current or Ex-Partner: No    Emotionally Abused: No    Physically Abused: No    Sexually Abused: No    Family History  Problem Relation Age of Onset   Diabetes Mother    Arthritis Mother    Cancer Mother        lung cancer   Hyperlipidemia Mother    Stroke Mother    Alcohol abuse Father    Cancer Father        lung   Arthritis Father    Diabetes Brother      Current Outpatient Medications:    atorvastatin  (LIPITOR) 20 MG tablet, Take 20 mg by mouth at bedtime., Disp: , Rfl:    celecoxib  (CELEBREX ) 200 MG capsule, Take 1 capsule (200 mg total) by mouth 2 (two) times daily., Disp: , Rfl:    Continuous Glucose Sensor (FREESTYLE LIBRE 3 PLUS SENSOR) MISC, CHANCE SENSOR EVERY 15 DAYS, Disp: 2 each, Rfl: 3   cyclobenzaprine  (FLEXERIL ) 10 MG tablet, Take 10 mg by mouth 3 (three) times daily., Disp: , Rfl:    empagliflozin  (JARDIANCE ) 25 MG TABS tablet, Take 1 tablet (25 mg total) by mouth daily., Disp: 90 tablet, Rfl: 1   furosemide  (LASIX ) 20 MG tablet, Take 3 tablets (60 mg total) by mouth daily., Disp: 90 tablet, Rfl: 5   insulin  aspart (NOVOLOG ) 100 UNIT/ML injection, Per sliding scale  MAX 15 units in 24 hour period. (Patient taking differently: Inject into the skin. Per sliding scale  MAX 15 units in 24 hour period.), Disp: 10 mL, Rfl: PRN   iron  polysaccharides (NIFEREX) 150 MG capsule, Take 1 capsule (150 mg total) by mouth daily.,  Disp: 30 capsule, Rfl: 0   lidocaine  (LIDODERM ) 5 %, Place 3 patches onto the skin daily. Remove & Discard patch within 12 hours or as directed by MD (Patient taking differently: Place 3 patches  onto the skin as needed. Remove & Discard patch within 12 hours or as directed by MD), Disp: 90 patch, Rfl: 0   lubiprostone  (AMITIZA ) 8 MCG capsule, Take 1 capsule (8 mcg total) by mouth 3 (three) times daily., Disp: 90 capsule, Rfl: 1   Oxycodone  HCl 20 MG TABS, Take 1 tablet by mouth every 4 (four) hours as needed (Pain)., Disp: , Rfl:    pantoprazole  (PROTONIX ) 40 MG tablet, Take 1 tablet (40 mg total) by mouth daily. (Patient taking differently: Take 40 mg by mouth every morning.), Disp: 90 tablet, Rfl: 3   polyethylene glycol (MIRALAX  / GLYCOLAX ) 17 g packet, Take 17 g by mouth daily as needed for mild constipation., Disp: 14 each, Rfl: 0   pregabalin  (LYRICA ) 200 MG capsule, Take 1 capsule (200 mg total) by mouth in the morning, at noon, and at bedtime., Disp: 90 capsule, Rfl: 0   Semaglutide , 2 MG/DOSE, (OZEMPIC , 2 MG/DOSE,) 8 MG/3ML SOPN, Inject 2 mg into the skin once a week. Monday, Disp: , Rfl:    spironolactone  (ALDACTONE ) 25 MG tablet, Take 1 tablet (25 mg total) by mouth daily., Disp: 90 tablet, Rfl: 3   tadalafil (CIALIS) 20 MG tablet, Take 20 mg by mouth daily as needed for erectile dysfunction., Disp: , Rfl:    terbinafine  (LAMISIL ) 250 MG tablet, TAKE 1 TABLET BY MOUTH ONCE DAILY, Disp: 90 tablet, Rfl: 0   triamcinolone  cream (KENALOG ) 0.5 %, Apply 1 Application topically 2 (two) times daily., Disp: 30 g, Rfl: 0   zolpidem  (AMBIEN ) 10 MG tablet, Take 1 tablet (10 mg total) by mouth at bedtime., Disp: 30 tablet, Rfl: 0   acetaminophen  (TYLENOL ) 325 MG tablet, Take 2 tablets (650 mg total) by mouth every 8 (eight) hours as needed for mild pain (pain score 1-3)., Disp: 40 tablet, Rfl: 0   lidocaine -prilocaine (EMLA) cream, Apply to affected area once, Disp: 30 g, Rfl: 3   ondansetron  (ZOFRAN ) 8 MG tablet, Take 1 tablet (8 mg total) by mouth every 8 (eight) hours as needed for nausea or vomiting., Disp: 30 tablet, Rfl: 1   prochlorperazine (COMPAZINE) 10 MG tablet, Take 1 tablet (10 mg  total) by mouth every 6 (six) hours as needed for nausea or vomiting., Disp: 30 tablet, Rfl: 1  Physical exam:  Vitals:   10/24/23 1405  BP: 115/77  Pulse: 94  Resp: 18  Temp: 98.1 F (36.7 C)  TempSrc: Tympanic  SpO2: 100%  Weight: 177 lb (80.3 kg)  Height: 5' 8 (1.727 m)   Physical Exam   I have personally reviewed labs listed below:    Latest Ref Rng & Units 10/27/2023   11:20 AM  CMP  Glucose 70 - 99 mg/dL 852   BUN 8 - 23 mg/dL 16   Creatinine 9.38 - 1.24 mg/dL 9.09   Sodium 864 - 854 mmol/L 139   Potassium 3.5 - 5.1 mmol/L 3.7   Chloride 98 - 111 mmol/L 103       Latest Ref Rng & Units 10/27/2023   11:20 AM  CBC  Hemoglobin 13.0 - 17.0 g/dL 89.7   Hematocrit 60.9 - 52.0 % 30.0    I have personally reviewed Radiology images listed below:   DG CHEST PORT 1 VIEW Result Date: 10/27/2023 CLINICAL  DATA:  Port-A-Cath placement. EXAM: PORTABLE CHEST 1 VIEW COMPARISON:  08/22/2023 FINDINGS: The cardio pericardial silhouette is enlarged. Vascular congestion noted without overt airspace pulmonary edema. No dense focal airspace consolidation. No pleural effusion. Right Port-A-Cath tip overlies the upper right atrium. No evidence for pneumothorax. IMPRESSION: Right Port-A-Cath tip overlies the upper right atrium. No pneumothorax. Electronically Signed   By: Camellia Candle M.D.   On: 10/27/2023 14:55   DG C-Arm 1-60 Min-No Report Result Date: 10/27/2023 Fluoroscopy was utilized by the requesting physician.  No radiographic interpretation.     Assessment and plan- Patient is a 68 y.o. male ***   Visit Diagnosis 1. Malignant neoplasm of colon, unspecified part of colon (HCC)      Dr. Annah Skene, MD, MPH [facility] [phone] 10/28/2023 1:49 PM

## 2023-10-29 ENCOUNTER — Encounter: Payer: Self-pay | Admitting: Oncology

## 2023-10-29 LAB — SIGNATERA
SIGNATERA MTM READOUT: 0 MTM/ml
SIGNATERA TEST RESULT: NEGATIVE

## 2023-10-30 ENCOUNTER — Inpatient Hospital Stay

## 2023-10-30 ENCOUNTER — Encounter: Payer: Self-pay | Admitting: Surgery

## 2023-10-30 DIAGNOSIS — C189 Malignant neoplasm of colon, unspecified: Secondary | ICD-10-CM

## 2023-10-30 NOTE — Progress Notes (Signed)
 [facility] CSW Progress Note  Clinical Social Work introduced self to patient during Patient Education with Raoul Moats, Charity fundraiser.  Provided information regarding CSW role, including counseling, advanced care planning and support group.  Answered questions as needed.  Follow Up Plan:  CSW will follow-up with patient by phone     Macario CHRISTELLA Au, LCSW Clinical Social Worker [facility]

## 2023-10-31 ENCOUNTER — Telehealth: Payer: Self-pay

## 2023-10-31 ENCOUNTER — Inpatient Hospital Stay

## 2023-10-31 DIAGNOSIS — G894 Chronic pain syndrome: Secondary | ICD-10-CM | POA: Diagnosis not present

## 2023-10-31 DIAGNOSIS — M545 Low back pain, unspecified: Secondary | ICD-10-CM | POA: Diagnosis not present

## 2023-10-31 DIAGNOSIS — Z79891 Long term (current) use of opiate analgesic: Secondary | ICD-10-CM | POA: Diagnosis not present

## 2023-10-31 DIAGNOSIS — M160 Bilateral primary osteoarthritis of hip: Secondary | ICD-10-CM | POA: Diagnosis not present

## 2023-10-31 DIAGNOSIS — M25512 Pain in left shoulder: Secondary | ICD-10-CM | POA: Diagnosis not present

## 2023-10-31 DIAGNOSIS — M961 Postlaminectomy syndrome, not elsewhere classified: Secondary | ICD-10-CM | POA: Diagnosis not present

## 2023-10-31 NOTE — Telephone Encounter (Signed)
 Clinical Social Work was referred by nurse navigator due to distress screen score.  CSW attempted to contact patient by phone.  Left voicemail with contact information and request for return call.

## 2023-10-31 NOTE — Progress Notes (Signed)
 [facility] Psychosocial Distress Screening Clinical Social Work  Erik Martinez is a 68 y.o. year old male. Clinical Social Work was referred by nurse navigator for positive distress screening. The patient scored a 10 on the Psychosocial Distress Thermometer which indicates severe distress. Clinical Social Worker contacted patient by phone to assess for distress and other psychosocial needs.     Distress Screen:    10/30/2023    2:30 PM  ONCBCN DISTRESS SCREENING  Screening Type Initial Screening  How much distress have you been experiencing in the past week? (0-10) 10  Practical concerns type Work  Social concerns type Relationship with children  Emotional concerns type Worry or anxiety;Sadness or depression  Physical Concerns Type  Pain;Sleep     Interventions: Provided brief mental health counseling with regard to anxiety.  Patient reports having a history of anxiety with medication intervention being helpful.  He is receiving minimal sleep at night with racing thoughts.  Patient has one son who is supportive, and a son and daughter who are not.  Patient lives alone with his dog.  Patient does not qualify for the [facility].   CSW and patient discussed common feeling and emotions when being diagnosed with cancer, and the importance of support during treatment.  CSW informed patient of the support team and support services at [facility].  CSW provided contact information and encouraged patient to call with any questions or concerns.   Follow Up Plan: CSW will follow-up with patient by phone  Patient verbalizes understanding of plan: Yes    Macario HERO Lizzette Carbonell, LCSW

## 2023-11-01 ENCOUNTER — Encounter: Payer: Self-pay | Admitting: Oncology

## 2023-11-01 ENCOUNTER — Inpatient Hospital Stay

## 2023-11-01 ENCOUNTER — Inpatient Hospital Stay (HOSPITAL_BASED_OUTPATIENT_CLINIC_OR_DEPARTMENT_OTHER): Admitting: Oncology

## 2023-11-01 VITALS — BP 127/69 | HR 93

## 2023-11-01 VITALS — BP 111/86 | HR 93 | Temp 96.6°F | Resp 18 | Ht 69.0 in | Wt 184.4 lb

## 2023-11-01 DIAGNOSIS — Z5111 Encounter for antineoplastic chemotherapy: Secondary | ICD-10-CM

## 2023-11-01 DIAGNOSIS — C189 Malignant neoplasm of colon, unspecified: Secondary | ICD-10-CM | POA: Diagnosis not present

## 2023-11-01 DIAGNOSIS — F411 Generalized anxiety disorder: Secondary | ICD-10-CM

## 2023-11-01 LAB — CBC WITH DIFFERENTIAL (CANCER CENTER ONLY)
Abs Immature Granulocytes: 0.06 K/uL (ref 0.00–0.07)
Basophils Absolute: 0 K/uL (ref 0.0–0.1)
Basophils Relative: 0 %
Eosinophils Absolute: 0.6 K/uL — ABNORMAL HIGH (ref 0.0–0.5)
Eosinophils Relative: 8 %
HCT: 27.9 % — ABNORMAL LOW (ref 39.0–52.0)
Hemoglobin: 8.6 g/dL — ABNORMAL LOW (ref 13.0–17.0)
Immature Granulocytes: 1 %
Lymphocytes Relative: 19 %
Lymphs Abs: 1.3 K/uL (ref 0.7–4.0)
MCH: 21.2 pg — ABNORMAL LOW (ref 26.0–34.0)
MCHC: 30.8 g/dL (ref 30.0–36.0)
MCV: 68.9 fL — ABNORMAL LOW (ref 80.0–100.0)
Monocytes Absolute: 0.8 K/uL (ref 0.1–1.0)
Monocytes Relative: 11 %
Neutro Abs: 4.3 K/uL (ref 1.7–7.7)
Neutrophils Relative %: 61 %
Platelet Count: 245 K/uL (ref 150–400)
RBC: 4.05 MIL/uL — ABNORMAL LOW (ref 4.22–5.81)
RDW: 20.9 % — ABNORMAL HIGH (ref 11.5–15.5)
WBC Count: 7.1 K/uL (ref 4.0–10.5)
nRBC: 0 % (ref 0.0–0.2)

## 2023-11-01 LAB — CMP (CANCER CENTER ONLY)
ALT: 8 U/L (ref 0–44)
AST: 18 U/L (ref 15–41)
Albumin: 3.1 g/dL — ABNORMAL LOW (ref 3.5–5.0)
Alkaline Phosphatase: 90 U/L (ref 38–126)
Anion gap: 8 (ref 5–15)
BUN: 20 mg/dL (ref 8–23)
CO2: 25 mmol/L (ref 22–32)
Calcium: 8.1 mg/dL — ABNORMAL LOW (ref 8.9–10.3)
Chloride: 100 mmol/L (ref 98–111)
Creatinine: 0.96 mg/dL (ref 0.61–1.24)
GFR, Estimated: >60 mL/min (ref >60)
Glucose, Bld: 161 mg/dL — ABNORMAL HIGH (ref 70–99)
Potassium: 3.7 mmol/L (ref 3.5–5.1)
Sodium: 133 mmol/L — ABNORMAL LOW (ref 135–145)
Total Bilirubin: 0.5 mg/dL (ref 0.0–1.2)
Total Protein: 7 g/dL (ref 6.5–8.1)

## 2023-11-01 MED ORDER — DIAZEPAM 5 MG PO TABS: 5.0000 mg | ORAL_TABLET | Freq: Every evening | ORAL | 0 refills | Status: DC

## 2023-11-01 MED ORDER — OXYCODONE HCL 5 MG PO TABS
10.0000 mg | ORAL_TABLET | Freq: Once | ORAL | Status: AC
  Administered 2023-11-01: 10 mg via ORAL
  Filled 2023-11-01: qty 2

## 2023-11-01 MED ORDER — FLUOROURACIL CHEMO INJECTION 2.5 GM/50ML
400.0000 mg/m2 | Freq: Once | INTRAVENOUS | Status: AC
  Administered 2023-11-01: 800 mg via INTRAVENOUS
  Filled 2023-11-01: qty 16

## 2023-11-01 MED ORDER — PROCHLORPERAZINE MALEATE 10 MG PO TABS
10.0000 mg | ORAL_TABLET | Freq: Once | ORAL | Status: AC
  Administered 2023-11-01: 10 mg via ORAL
  Filled 2023-11-01: qty 1

## 2023-11-01 MED ORDER — SODIUM CHLORIDE 0.9 % IV SOLN
2400.0000 mg/m2 | INTRAVENOUS | Status: DC
  Administered 2023-11-01: 5000 mg via INTRAVENOUS
  Filled 2023-11-01: qty 100

## 2023-11-01 MED ORDER — SODIUM CHLORIDE 0.9 % IV SOLN
INTRAVENOUS | Status: DC
  Filled 2023-11-01: qty 250

## 2023-11-01 MED ORDER — SODIUM CHLORIDE 0.9 % IV SOLN
400.0000 mg/m2 | Freq: Once | INTRAVENOUS | Status: AC
  Administered 2023-11-01: 784 mg via INTRAVENOUS
  Filled 2023-11-01: qty 25

## 2023-11-01 NOTE — Progress Notes (Signed)
 Hematology/Oncology Consult note [facility]  Telephone:([phone] [phone]  Patient Care Team: [name], MD as PCP - General (Family Medicine) Fate Morna SAILOR, [facility] (Inactive) as Pharmacist (Pharmacist) Maurie Rayfield BIRCH, RN as Oncology Nurse Navigator [name], MD as Consulting Physician (Oncology)   Name of the patient: Erik Martinez  [id number]  May 09, 1955   Date of visit: 11/01/23  Diagnosis-  Cancer Staging  Colon cancer [facility]) Staging form: Colon and Rectum, AJCC 8th Edition - Pathologic stage from 09/26/2023: Stage IIB (pT4a, pN0, cM0) - Signed by [name], MD on 09/26/2023 Total positive nodes: 0 Histologic grading system: 4 grade system Histologic grade (G): G2 Residual tumor (R): R0    Chief complaint/ Reason for visit-on treatment assessment prior to cycle 1 of adjuvant 5-FU chemotherapy  Heme/Onc history:  Patient is a 68 year old male who underwent colonoscopy on 08/25/2023 which showed a partially obstructing mass in the transverse colon.  This was biopsied and consistent with colon adenocarcinoma.  Upper endoscopy was normal.  Patient underwent CT chest abdomen and pelvis with contrast on 08/25/2023 which showed circumferential luminal narrowing of the colon at the hepatic flexure consistent with primary colonic neoplasm.  No evidence of metastatic adenopathy in the abdomen and pelvis.  No evidence of liver metastases.  No evidence of intrathoracic metastases.  Small nodules around the oblique seizures favored to be benign.Preoperative CEA was mildly elevated at 6.6.   Patient underwent right hemicolectomy on 09/14/2023 which showed invasive adenocarcinoma moderately differentiated 4 cm focally invading visceral peritoneum.  Margins negative.  23 examined lymph nodes negative.  No evidence of lymphovascular invasion.  Tumor budding score moderate at 5.  pT4 N0. MSI stable.  Signatera testing was negative.  However  decision made to offer single agent 5-FU chemotherapy for 6 months given T4 disease.  Oxaliplatin omitted due to pre-existing neuropathy    Interval history-   Erik Martinez is a 68 year old male with stage two colon cancer who presents for chemotherapy treatment.  He has a history of stage two colon cancer, which was surgically removed. The cancer was classified as T4 due to its penetration through the colon wall, posing a risk for recurrence. No lymph nodes were positive, and surgical margins were clear. Recent Signatera testing showed no circulating cancer cells.  He experiences neuropathy, which is a concern with certain chemotherapy agents.  He has significant anxiety and difficulty sleeping, which have been ongoing issues. He is not currently taking any medication for anxiety but has previously used generic Xanax . He prefers Xanax  or Valium  over other medications like Lexapro or Zoloft, citing past negative experiences with these medications. He describes his anxiety as pervasive, affecting his ability to complete tasks and causing him to 'think about tomorrow' constantly.       ECOG PS- 1 Pain scale- 3   Review of systems- Review of Systems  Constitutional:  Negative for chills, fever, malaise/fatigue and weight loss.  HENT:  Negative for congestion, ear discharge and nosebleeds.   Eyes:  Negative for blurred vision.  Respiratory:  Negative for cough, hemoptysis, sputum production, shortness of breath and wheezing.   Cardiovascular:  Negative for chest pain, palpitations, orthopnea and claudication.  Gastrointestinal:  Negative for abdominal pain, blood in stool, constipation, diarrhea, heartburn, melena, nausea and vomiting.  Genitourinary:  Negative for dysuria, flank pain, frequency, hematuria and urgency.  Musculoskeletal:  Negative for back pain, joint pain and myalgias.  Skin:  Negative for rash.  Neurological:  Negative for dizziness, tingling, focal weakness, seizures,  weakness and headaches.  Endo/Heme/Allergies:  Does not bruise/bleed easily.  Psychiatric/Behavioral:  Negative for depression and suicidal ideas. The patient is nervous/anxious. The patient does not have insomnia.       Allergies  Allergen Reactions   Suboxone [Buprenorphine Hcl-Naloxone  Hcl] Nausea And Vomiting   Tape Other (See Comments)    Whelps *Paper Tape is ok    Codeine Rash, Itching and Nausea Only   Morphine  And Codeine Itching and Rash   Other Rash    Telemetry electrodes   Silicone Rash    Whelps -*Paper Tape is ok      Past Medical History:  Diagnosis Date   (HFpEF) heart failure with preserved ejection fraction (HCC)    Adenocarcinoma, colon (HCC) 08/25/2023   Anemia    Anxiety    Aortic atherosclerosis    Arthritis    Avascular necrosis of bones of both hips (HCC)    Bipolar disorder (HCC)    Bladder spasms    Chronic pain of left knee    Chronic pain syndrome    a.) followed by pain clinic in [place]   Chronic, continuous use of opioids    a.) managed by pain clinic in [place]   DDD (degenerative disc disease)    Depression    Erectile dysfunction    a.) on PDE5i (tadalafil)   Frequency-urgency syndrome    GERD (gastroesophageal reflux disease)    Hepatitis C 2018   History of anesthesia complications    a.) PONV; b.) PDPH; c.) difficult to sedate   History of hepatitis B    1983  TX'D--  NO ISSUES OR SYMPTOMS SINCE   Hyperlipidemia    Hypotestosteronemia    Insomnia    a.) on hypnotic PRN (zolpidem )   Multiple lung nodules on CT    a.) CT CAP 08/25/2023: along oblique fissures - favored to be benign   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   Neurogenic bladder    Nocturia    PAD (peripheral artery disease)    Polysubstance abuse (HCC)    a.) cocaine + marijuana + BZO + opioids   PONV (postoperative nausea and vomiting)    Spinal headache    T2DM (type 2 diabetes mellitus) (HCC)    Urine incontinence      Past Surgical History:  Procedure  Laterality Date   APPLICATION OF WOUND VAC Left 09/11/2021   Procedure: APPLICATION OF WOUND VAC;  Surgeon: [name], MD;  Location: [facility];  Service: Orthopedics;  Laterality: Left;  [id number]    BACK SURGERY     2 rods and 4 screws artificial disc   COLONOSCOPY  2008   COLONOSCOPY N/A 08/25/2023   Procedure: COLONOSCOPY;  Surgeon: [name], MD;  Location: [facility] END[facility]COPY;  Service: Endoscopy;  Laterality: N/A;   ESOPHAGOGASTRODUODENOSCOPY N/A 08/25/2023   Procedure: EGD (ESOPHAGOGASTRODUODENOSCOPY);  Surgeon: [name], MD;  Location: [facility] ENDOSCOPY;  Service: Endoscopy;  Laterality: N/A;   INTERSTIM IMPLANT PLACEMENT  2007   INTERSTIM IMPLANT REVISION N/A 08/17/2012   Procedure: REPLACMENT OF IPG PLUS REPLACE LEAD OF INTERSTIM IMPLANT ;  Surgeon: [name], MD;  Location: [facility];  Service: Urology;  Laterality: N/A;   IRRIGATION AND DEBRIDEMENT KNEE Left 09/11/2021   Procedure: IRRIGATION AND DEBRIDEMENT WITH POLY EXCHANGE LEFT KNEE;  Surgeon: [name], MD;  Location: [facility];  Service: Orthopedics;  Laterality: Left;   IRRIGATION AND  DEBRIDEMENT KNEE Left 06/01/2022   Procedure: IRRIGATION AND DEBRIDEMENT KNEE;  Surgeon: [name], MD;  Location: [facility];  Service: Orthopedics;  Laterality: Left;   LOWER EXTREMITY ANGIOGRAPHY Left 11/17/2020   Procedure: LOWER EXTREMITY ANGIOGRAPHY;  Surgeon: [name], MD;  Location: [facility];  Service: Cardiovascular;  Laterality: Left;   LOWER EXTREMITY ANGIOGRAPHY Right 12/22/2020   Procedure: LOWER EXTREMITY ANGIOGRAPHY;  Surgeon: [name], MD;  Location: [facility];  Service: Cardiovascular;  Laterality: Right;   LOWER EXTREMITY ANGIOGRAPHY Left 12/14/2021   Procedure: Lower Extremity Angiography;  Surgeon: [name], MD;  Location: [facility];  Service: Cardiovascular;  Laterality: Left;   LUMBAR DISC SURGERY  2005   L5   LUMBAR  FUSION  X2  2006  &  2007   MANIPULATION KNEE JOINT Left 08/2002   OPEN DEBRIDEMENT LEFT TOTAL KNEE (SCAR, BONEY GRAOWTH)/ REMOVAL OLD SUTURES  05/01/2007   PARTIAL HIP ARTHROPLASTY Left 2001   x2   PORTACATH PLACEMENT N/A 10/27/2023   Procedure: INSERTION, TUNNELED CENTRAL VENOUS DEVICE, WITH PORT;  Surgeon: Tye Millet, DO;  Location: [facility];  Service: General;  Laterality: N/A;   SHOULDER OPEN ROTATOR CUFF REPAIR Left 2000   TOTAL HIP ARTHROPLASTY Left 2002   TOTAL KNEE ARTHROPLASTY Left 06/2002   PARTIAL LEFT KNEE REPLACEMENT PRIOR TO THIS   TOTAL KNEE REVISION Left 2008   TOTAL KNEE REVISION Left 07/19/2021   Procedure: TOTAL KNEE REVISION;  Surgeon: [name], MD;  Location: [facility];  Service: Orthopedics;  Laterality: Left;   TOTAL KNEE REVISION Left 2005   TOTAL KNEE REVISION Left 06/01/2022   Procedure: TOTAL KNEE REVISION;  Surgeon: [name], MD;  Location: [facility];  Service: Orthopedics;  Laterality: Left;   TOTAL KNEE REVISION Left 04/01/2022   Procedure: Removal of left total knee implants with insertion of spacer.;  Surgeon: [name], MD;  Location: [facility];  Service: Orthopedics;  Laterality: Left;   TRIGGER FINGER RELEASE Bilateral    SEVERAL FINGERS   TRIGGER FINGER RELEASE Right 06/18/2014   Procedure: RELEASE TRIGGER FINGER/A-1 PULLEY;  Surgeon: Lynwood SQUIBB Mardee, MD;  Location: [facility];  Service: Orthopedics;  Laterality: Right;    Social History   Socioeconomic History   Marital status: Divorced    Spouse name: Not on file   Number of children: 3   Years of education: Not on file   Highest education level: Some college, no degree  Occupational History   Not on file  Tobacco Use   Smoking status: Never   Smokeless tobacco: Never  Vaping Use   Vaping status: Never Used  Substance and Sexual Activity   Alcohol use: Not Currently    Alcohol/week: 0.0 - 2.0 standard drinks of alcohol   Drug use: Not Currently    Types: Cocaine     Comment: last + cocaine UDS 07/2016   Sexual activity: Not Currently    Birth control/protection: None  Other Topics Concern   Not on file  Social History Narrative    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   Overall Financial Resource Strain (CARDIA)    Difficulty of Paying Living Expenses: Not hard at all  Food Insecurity: No Food Insecurity (09/26/2023)   Hunger Vital Sign    Worried About Running Out  of Food in the Last Year: Never true    Ran Out of Food in the Last Year: Never true  Recent Concern: Food Insecurity - Food Insecurity Present (07/11/2023)   Received from [facility]   Hunger Vital Sign    Within the past 12 months, you worried that your food would run out before you got the money to buy more.: Sometimes true    Within the past 12 months, the food you bought just didn't last and you didn't have money to get more.: Never true  Transportation Needs: No Transportation Needs (09/26/2023)   PRAPARE - Administrator, Civil Service (Medical): No    Lack of Transportation (Non-Medical): No  Physical Activity: Inactive (01/28/2022)   Exercise Vital Sign    Days of Exercise per Week: 0 days    Minutes of Exercise per Session: 0 min  Stress: No Stress Concern Present (01/28/2022)   [employer] of Occupational Health - Occupational Stress Questionnaire    Feeling of Stress : Not at all  Social Connections: Moderately Isolated (09/13/2023)   Social Connection and Isolation Panel    Frequency of Communication with Friends and Family: More than three times a week    Frequency of Social Gatherings with Friends and Family: More than three times a week    Attends Religious Services: More than 4 times per year    Active Member of [employer] or Organizations: No    Attends Banker Meetings: Never    Marital Status: Divorced   Catering manager Violence: Not At Risk (09/26/2023)   Humiliation, Afraid, Rape, and Kick questionnaire    Fear of Current or Ex-Partner: No    Emotionally Abused: No    Physically Abused: No    Sexually Abused: No    Family History  Problem Relation Age of Onset   Diabetes Mother    Arthritis Mother    Cancer Mother        lung cancer   Hyperlipidemia Mother    Stroke Mother    Alcohol abuse Father    Cancer Father        lung   Arthritis Father    Diabetes Brother      Current Outpatient Medications:    acetaminophen  (TYLENOL ) 325 MG tablet, Take 2 tablets (650 mg total) by mouth every 8 (eight) hours as needed for mild pain (pain score 1-3)., Disp: 40 tablet, Rfl: 0   atorvastatin  (LIPITOR) 20 MG tablet, Take 20 mg by mouth at bedtime., Disp: , Rfl:    celecoxib  (CELEBREX ) 200 MG capsule, Take 1 capsule (200 mg total) by mouth 2 (two) times daily., Disp: , Rfl:    Continuous Glucose Sensor (FREESTYLE LIBRE 3 PLUS SENSOR) MISC, CHANCE SENSOR EVERY 15 DAYS, Disp: 2 each, Rfl: 3   cyclobenzaprine  (FLEXERIL ) 10 MG tablet, Take 10 mg by mouth 3 (three) times daily., Disp: , Rfl:    diazepam  (VALIUM ) 5 MG tablet, Take 1 tablet (5 mg total) by mouth at bedtime., Disp: 30 tablet, Rfl: 0   diazepam  (VALIUM ) 5 MG tablet, Take 1 tablet (5 mg total) by mouth at bedtime., Disp: 30 tablet, Rfl: 0   empagliflozin  (JARDIANCE ) 25 MG TABS tablet, Take 1 tablet (25 mg total) by mouth daily., Disp: 90 tablet, Rfl: 1   furosemide  (LASIX ) 20 MG tablet, Take 3 tablets (60 mg total) by mouth daily., Disp: 90 tablet, Rfl: 5   insulin  aspart (NOVOLOG ) 100 UNIT/ML injection, Per sliding scale  MAX 15 units in 24 hour period. (Patient taking differently: Inject into the skin. Per sliding scale  MAX 15 units in 24 hour period.), Disp: 10 mL, Rfl: PRN   iron  polysaccharides (NIFEREX) 150 MG capsule, Take 1 capsule (150 mg total) by mouth daily., Disp: 30 capsule, Rfl: 0   lidocaine  (LIDODERM ) 5 %, Place 3  patches onto the skin daily. Remove & Discard patch within 12 hours or as directed by MD (Patient taking differently: Place 3 patches onto the skin as needed. Remove & Discard patch within 12 hours or as directed by MD), Disp: 90 patch, Rfl: 0   lidocaine -prilocaine (EMLA) cream, Apply to affected area once, Disp: 30 g, Rfl: 3   lubiprostone  (AMITIZA ) 8 MCG capsule, Take 1 capsule (8 mcg total) by mouth 3 (three) times daily., Disp: 90 capsule, Rfl: 1   ondansetron  (ZOFRAN ) 8 MG tablet, Take 1 tablet (8 mg total) by mouth every 8 (eight) hours as needed for nausea or vomiting., Disp: 30 tablet, Rfl: 1   Oxycodone  HCl 20 MG TABS, Take 1 tablet by mouth every 4 (four) hours as needed (Pain)., Disp: , Rfl:    pantoprazole  (PROTONIX ) 40 MG tablet, Take 1 tablet (40 mg total) by mouth daily. (Patient taking differently: Take 40 mg by mouth every morning.), Disp: 90 tablet, Rfl: 3   polyethylene glycol (MIRALAX  / GLYCOLAX ) 17 g packet, Take 17 g by mouth daily as needed for mild constipation., Disp: 14 each, Rfl: 0   pregabalin  (LYRICA ) 200 MG capsule, Take 1 capsule (200 mg total) by mouth in the morning, at noon, and at bedtime., Disp: 90 capsule, Rfl: 0   prochlorperazine (COMPAZINE) 10 MG tablet, Take 1 tablet (10 mg total) by mouth every 6 (six) hours as needed for nausea or vomiting., Disp: 30 tablet, Rfl: 1   Semaglutide , 2 MG/DOSE, (OZEMPIC , 2 MG/DOSE,) 8 MG/3ML SOPN, Inject 2 mg into the skin once a week. Monday, Disp: , Rfl:    spironolactone  (ALDACTONE ) 25 MG tablet, Take 1 tablet (25 mg total) by mouth daily., Disp: 90 tablet, Rfl: 3   tadalafil (CIALIS) 20 MG tablet, Take 20 mg by mouth daily as needed for erectile dysfunction., Disp: , Rfl:    terbinafine  (LAMISIL ) 250 MG tablet, TAKE 1 TABLET BY MOUTH ONCE DAILY, Disp: 90 tablet, Rfl: 0   triamcinolone  cream (KENALOG ) 0.5 %, Apply 1 Application topically 2 (two) times daily., Disp: 30 g, Rfl: 0   zolpidem  (AMBIEN ) 10 MG tablet, Take 1 tablet  (10 mg total) by mouth at bedtime., Disp: 30 tablet, Rfl: 0 No current facility-administered medications for this visit.  Facility-Administered Medications Ordered in Other Visits:    0.9 %  sodium chloride  infusion, , Intravenous, Continuous, [name], MD, Stopped at 11/01/23 1155   fluorouracil (ADRUCIL) 5,000 mg in sodium chloride  0.9 % 150 mL chemo infusion, 2,400 mg/m2 (Treatment Plan Recorded), Intravenous, 1 day or 1 dose, [name], MD, Infusion Verify at 11/01/23 1212  Physical exam:  Vitals:   11/01/23 0906 11/01/23 0948  BP:  111/86  Pulse:  93  Resp:  18  Temp:  (!) 96.6 F (35.9 C)  TempSrc:  Tympanic  SpO2:  100%  Weight:  184 lb 6.4 oz (83.6 kg)  Height: 5' 8 (1.727 m) 5' 9 (1.753 m)   Physical Exam Cardiovascular:     Rate and Rhythm: Normal rate and regular rhythm.     Heart sounds: Normal heart sounds.  Pulmonary:  Effort: Pulmonary effort is normal.     Breath sounds: Normal breath sounds.  Skin:    General: Skin is warm and dry.  Neurological:     Mental Status: He is alert and oriented to person, place, and time.      I have personally reviewed labs listed below:    Latest Ref Rng & Units 11/01/2023    8:51 AM  CMP  Glucose 70 - 99 mg/dL 838   BUN 8 - 23 mg/dL 20   Creatinine 9.38 - 1.24 mg/dL 9.03   Sodium 864 - 854 mmol/L 133   Potassium 3.5 - 5.1 mmol/L 3.7   Chloride 98 - 111 mmol/L 100   CO2 22 - 32 mmol/L 25   Calcium  8.9 - 10.3 mg/dL 8.1   Total Protein 6.5 - 8.1 g/dL 7.0   Total Bilirubin 0.0 - 1.2 mg/dL 0.5   Alkaline Phos 38 - 126 U/L 90   AST 15 - 41 U/L 18   ALT 0 - 44 U/L 8       Latest Ref Rng & Units 11/01/2023    8:51 AM  CBC  WBC 4.0 - 10.5 K/uL 7.1   Hemoglobin 13.0 - 17.0 g/dL 8.6   Hematocrit 60.9 - 52.0 % 27.9   Platelets 150 - 400 K/uL 245    I have personally reviewed Radiology images listed below: No images are attached to the encounter.  DG CHEST PORT 1 VIEW Result Date:  10/27/2023 CLINICAL DATA:  Port-A-Cath placement. EXAM: PORTABLE CHEST 1 VIEW COMPARISON:  08/22/2023 FINDINGS: The cardio pericardial silhouette is enlarged. Vascular congestion noted without overt airspace pulmonary edema. No dense focal airspace consolidation. No pleural effusion. Right Port-A-Cath tip overlies the upper right atrium. No evidence for pneumothorax. IMPRESSION: Right Port-A-Cath tip overlies the upper right atrium. No pneumothorax. Electronically Signed   By: Camellia Candle M.D.   On: 10/27/2023 14:55   DG C-Arm 1-60 Min-No Report Result Date: 10/27/2023 Fluoroscopy was utilized by the requesting physician.  No radiographic interpretation.     Assessment and plan- Patient is a 68 y.o. male with history of stage II colon adenocarcinoma T4 N0 M0 here for on treatment assessment prior to cycle 1 of adjuvant 5-FU chemotherapy  Assessment and Plan    Stage II T4 colon cancer, post-resection, undergoing adjuvant chemotherapy Stage II T4 colon cancer with deep penetration through the colon wall, indicating a higher risk for recurrence. No lymph node involvement and clear surgical margins. Negative Signatera test for circulating cancer cells, but adjuvant chemotherapy recommended due to T4 status to reduce recurrence risk. - Administer 5FU chemotherapy every two weeks for up to six months. - Evaluate treatment tolerance and quality of life at three months. - Educated on chemotherapy process and use of port and pump.  Chemotherapy-induced peripheral neuropathy Existing peripheral neuropathy, which could be exacerbated by oxaliplatin. Decision made to exclude oxaliplatin from chemotherapy regimen to prevent worsening of neuropathy.  Generalized anxiety disorder Reports significant anxiety, particularly related to chemotherapy and daily life stressors. Previously used Xanax , but prefers not to use long-term due to habit-forming nature. Declined long-acting SSRIs like escitalopram and  sertraline due to past negative experiences. Agreed to use Valium  during chemotherapy with plan to discontinue post-treatment. - Prescribe Valium  at night as needed during chemotherapy. - Discuss long-term anxiety management with primary care provider towards end of chemotherapy.  Insomnia Experiencing difficulty sleeping, potentially related to anxiety and stress. Currently on Ambien  as prescribed by primary care  Visit Diagnosis 1. Malignant neoplasm of colon, unspecified part of colon (HCC)   2. Encounter for antineoplastic chemotherapy   3. GAD (generalized anxiety disorder)      Dr. Annah Skene, MD, MPH [facility] at [facility] [phone] 11/01/2023 12:59 PM

## 2023-11-01 NOTE — Telephone Encounter (Signed)
Both medications have been approved

## 2023-11-01 NOTE — Progress Notes (Signed)
 Patient has some questions regarding his condition. He also states that our Child psychotherapist reached out to him to check in and stated she would email Dr. Melanee regarding patient having anxiety and not sleeping.

## 2023-11-02 ENCOUNTER — Telehealth: Payer: Self-pay

## 2023-11-02 NOTE — Telephone Encounter (Signed)
 Telephone call to patient.   No answer and unable to leave voice message.

## 2023-11-03 ENCOUNTER — Inpatient Hospital Stay

## 2023-11-03 ENCOUNTER — Telehealth: Payer: Self-pay

## 2023-11-03 NOTE — Telephone Encounter (Signed)
 Pt came in to clinic complaining of chest pain/ chest tightness worsening with exertion. Pt states that he is also feeling tingling down his arms. Also endorses extreme shortness of breath. Pt states sometimes the tightness in his chest gets so bad, he cannot walk. Spoke with Ellouise Class, FNP. Per Ellouise, pt to report to ED. Verbalized to pt provider  recommendations. Pt refused. Pt states that he has a dog in a kennel and his son at home with his grandchildren. Advised the seriousness of his symptoms need to be evaluated. Pt states he cannot sit in the ED for hours and he left.

## 2023-11-04 ENCOUNTER — Other Ambulatory Visit: Payer: Self-pay

## 2023-11-04 DIAGNOSIS — S81002A Unspecified open wound, left knee, initial encounter: Secondary | ICD-10-CM | POA: Insufficient documentation

## 2023-11-04 NOTE — Assessment & Plan Note (Addendum)
 Acute, concern about retained suture, no definite sign of joint infection. Has upcoming visit with orthopedics later today.

## 2023-11-04 NOTE — Assessment & Plan Note (Signed)
 Tolerable control  in setting of new start chemotherapy for colon cancer.  Ozempic  2 mg weekly.  Metformin  1000 mg BID  jardiance  25 mg daily  Insulin  Novolog   per Sliding scale

## 2023-11-04 NOTE — Assessment & Plan Note (Addendum)
 Chronic, goes to [facility] for pain... Next office visit October 21  He has chronic low pain ( history of spinal stimulator), hand pain,  left hip.   Hx of avascular necrosis bilateral hips.  After back surgery had nerve issue with bladder.. under control now with bladder stimulator.  Chronic left knee pain ( complicated repair history involving joint/prosthesis infection s/p revision now with worsening, left knee pain, opening at scar.  New pain in bilateral legs.

## 2023-11-04 NOTE — Assessment & Plan Note (Signed)
 Acute worsening of chronic issue.  Patient has multiple reasons for bilateral lower leg pain including possible ongoing issue with left knee.  Pulses bilaterally feel diminished on exam today.  Recommend ABI evaluation for blood flow to rule out peripheral artery disease.

## 2023-11-06 ENCOUNTER — Telehealth: Payer: Self-pay | Admitting: Family

## 2023-11-06 ENCOUNTER — Telehealth: Payer: Self-pay | Admitting: *Deleted

## 2023-11-06 ENCOUNTER — Other Ambulatory Visit: Payer: Self-pay | Admitting: Family Medicine

## 2023-11-06 ENCOUNTER — Telehealth: Payer: Self-pay | Admitting: Cardiology

## 2023-11-06 NOTE — Telephone Encounter (Unsigned)
 Copied from CRM [id number]. Topic: Clinical - Medication Refill >> Nov 06, 2023  1:59 PM Erik Martinez wrote: Medication: celecoxib  (CELEBREX ) 200 MG capsule furosemide  (LASIX ) 20 MG tablet (states he called the heart failure clinic as well and they did not answer for the Lasix )   Has the patient contacted their pharmacy? Yes (Agent: If no, request that the patient contact the pharmacy for the refill. If patient does not wish to contact the pharmacy document the reason why and proceed with request.) (Agent: If yes, when and what did the pharmacy advise?)  This is the patient's preferred pharmacy:  [facility] - [place], [place] - [address] Erik Martinez Erik Martinez [name] [address] Phone: [phone] Fax: [phone]  Is this the correct pharmacy for this prescription? Yes If no, delete pharmacy and type the correct one.   Has the prescription been filled recently? No  Is the patient out of the medication? Yes  Has the patient been seen for an appointment in the last year OR does the patient have an upcoming appointment? Yes  Can we respond through MyChart? Yes  Agent: Please be advised that Rx refills may take up to 3 business days. We ask that you follow-up with your pharmacy.

## 2023-11-06 NOTE — Telephone Encounter (Signed)
 Called to confirm/remind patient of their appointment at the [facility] on 11/07/23.   Appointment:   [x] Confirmed  [] Left mess   [] No answer/No voice mail  [] VM Full/unable to leave message  [] Phone not in service  Patient reminded to bring all medications and/or complete list.  Confirmed patient has transportation. Gave directions, instructed to utilize valet parking.

## 2023-11-06 NOTE — Telephone Encounter (Signed)
 The patient told Phylllis has sores in the mouth and out of his  mouth, having eye problem , and he feels balance issue.I tried to call him 2 times and left message. He changed his phone and it is now [phone].I left my name and phone number,

## 2023-11-06 NOTE — Telephone Encounter (Unsigned)
 Copied from CRM [id number]. Topic: Clinical - Medication Question >> Nov 06, 2023  2:13 PM Jasmin G wrote: Reason for CRM: Pt called to let clinic know that he will not need prescription refill for furosemide  (LASIX ) 20 MG tablet as he was able to get in contact with clinic that places refill originally.

## 2023-11-06 NOTE — Telephone Encounter (Signed)
 Last office visit 10/24/2023 for Hospital follow up.  Last refilled ? 07/07/2023 for #30 with 2 refills.  Next Appt: 01/25/2024 for DM.

## 2023-11-06 NOTE — Telephone Encounter (Signed)
 He can be seen in [facility]. Please prescribe MMW int he meanwhile

## 2023-11-07 ENCOUNTER — Other Ambulatory Visit: Payer: Self-pay

## 2023-11-07 ENCOUNTER — Encounter: Payer: Self-pay | Admitting: Cardiology

## 2023-11-07 ENCOUNTER — Inpatient Hospital Stay: Admitting: Hospice and Palliative Medicine

## 2023-11-07 ENCOUNTER — Telehealth: Payer: Self-pay

## 2023-11-07 ENCOUNTER — Ambulatory Visit: Attending: Cardiology | Admitting: Cardiology

## 2023-11-07 ENCOUNTER — Inpatient Hospital Stay

## 2023-11-07 VITALS — BP 116/63 | HR 79 | Temp 97.8°F | Resp 18 | Ht 69.0 in | Wt 188.5 lb

## 2023-11-07 VITALS — BP 140/78 | HR 92 | Wt 191.1 lb

## 2023-11-07 DIAGNOSIS — I739 Peripheral vascular disease, unspecified: Secondary | ICD-10-CM | POA: Insufficient documentation

## 2023-11-07 DIAGNOSIS — R140 Abdominal distension (gaseous): Secondary | ICD-10-CM | POA: Insufficient documentation

## 2023-11-07 DIAGNOSIS — Z79631 Long term (current) use of antimetabolite agent: Secondary | ICD-10-CM | POA: Insufficient documentation

## 2023-11-07 DIAGNOSIS — C189 Malignant neoplasm of colon, unspecified: Secondary | ICD-10-CM | POA: Insufficient documentation

## 2023-11-07 DIAGNOSIS — I5033 Acute on chronic diastolic (congestive) heart failure: Secondary | ICD-10-CM | POA: Diagnosis not present

## 2023-11-07 DIAGNOSIS — Z95820 Peripheral vascular angioplasty status with implants and grafts: Secondary | ICD-10-CM | POA: Diagnosis not present

## 2023-11-07 DIAGNOSIS — R918 Other nonspecific abnormal finding of lung field: Secondary | ICD-10-CM | POA: Diagnosis not present

## 2023-11-07 DIAGNOSIS — R0789 Other chest pain: Secondary | ICD-10-CM | POA: Insufficient documentation

## 2023-11-07 DIAGNOSIS — I2510 Atherosclerotic heart disease of native coronary artery without angina pectoris: Secondary | ICD-10-CM | POA: Insufficient documentation

## 2023-11-07 DIAGNOSIS — Z95828 Presence of other vascular implants and grafts: Secondary | ICD-10-CM | POA: Insufficient documentation

## 2023-11-07 DIAGNOSIS — Z9049 Acquired absence of other specified parts of digestive tract: Secondary | ICD-10-CM | POA: Diagnosis not present

## 2023-11-07 DIAGNOSIS — I2089 Other forms of angina pectoris: Secondary | ICD-10-CM | POA: Diagnosis not present

## 2023-11-07 DIAGNOSIS — Z79899 Other long term (current) drug therapy: Secondary | ICD-10-CM | POA: Diagnosis not present

## 2023-11-07 DIAGNOSIS — Z5111 Encounter for antineoplastic chemotherapy: Secondary | ICD-10-CM | POA: Diagnosis not present

## 2023-11-07 DIAGNOSIS — I5032 Chronic diastolic (congestive) heart failure: Secondary | ICD-10-CM

## 2023-11-07 MED ORDER — TORSEMIDE 20 MG PO TABS: 40.0000 mg | ORAL_TABLET | Freq: Every day | ORAL | 5 refills | Status: DC

## 2023-11-07 MED ORDER — CELECOXIB 200 MG PO CAPS: 200.0000 mg | ORAL_CAPSULE | Freq: Two times a day (BID) | ORAL | 0 refills | Status: DC

## 2023-11-07 MED ORDER — MAGIC MOUTHWASH W/LIDOCAINE: 5.0000 mL | Freq: Four times a day (QID) | ORAL | 3 refills | Status: DC | PRN

## 2023-11-07 MED ORDER — AMLODIPINE BESYLATE 5 MG PO TABS: 5.0000 mg | ORAL_TABLET | Freq: Every day | ORAL | 3 refills | Status: AC

## 2023-11-07 NOTE — Patient Instructions (Signed)
 Magic mouth wash prescription will be sent to [facility].  You can also use baking soda mouth rinse to manage your symptoms. The instructions are as follows:  Dissolve about \(1/2\) teaspoon of baking soda in half a glass of warm water , swish, and spit.

## 2023-11-07 NOTE — Telephone Encounter (Signed)
 Patient seen by [facility] today.

## 2023-11-07 NOTE — Progress Notes (Signed)
 ADVANCED HEART FAILURE FOLLOW UP CLINIC NOTE  Referring Physician: [name], MD  Primary Care: [name], MD Primary Cardiologist:  HPI: Erik Martinez is a 68 y.o. male who presents for follow up of chronic diastolic heart failure.      Admitted 08/22/23 with shortness of breath and leg edema due to HF exacerbation due to severe anemia. Hg 7.3. Chest x-ray consistent with pulmonary vascular congestion. BNP elevated. Started on IV lasix  and cardiology consulted. Started on protonix  and GI consulted for anemia. Echo 08/24/23: EF 50-55%, G1DD, normal RV, mild/ moderate MR. Normal EGD 08/25/23. Colonoscopy 08/25/23 with a partially obstructing large mass in the transverse colon, likely malignant, biopsied. Surgery consulted. Chest/ abdomen/ pelvic CT 08/25/23 showing 4mm nodule, 5mm nodule right lung, colon mass.    Seen in [facility] 08/28/23 where furosemide  20mg  daily and spironolactone  25mg  daily were started.    Admitted 09/13/23 for laparoscopic right colectomy for invasive adenocarcinoma of right colon. PICC line was placed on 09/09 for TPN.      SUBJECTIVE:  Patient comes in as an acute visit today with a chief complaint of worsening shortness of breath and chest discomfort.  He reports that for the past 4 to 5 days he has had worsening abdominal distention, swelling, early satiety, and shortness of breath.  He ran out of his Lasix  2 days ago and reports the problem is worsened since.  His weight is up significantly since prior.  Also reports some no chest discomfort.  This chest discomfort seems to come on with exertion as well as when bending over.  He has been on 5-FU for chemotherapy but denies any relation to his most recent infusion.  PMH, current medications, allergies, social history, and family history reviewed in epic.  PHYSICAL EXAM: Vitals:   11/07/23 0950  BP: (!) 140/78  Pulse: 92  SpO2: 100%   GENERAL: Ill-appearing PULM:  Normal work of breathing, clear to  auscultation bilaterally. Respirations are unlabored.  CARDIAC:  JVP: Moderately elevated with V waves        Regular rate and rhythm, trace lower extremity edema ABDOMEN: Moderately distended, tender NEUROLOGIC: Patient is oriented x3 with no focal or lateralizing neurologic deficits.    ASSESSMENT & PLAN:  Acute on chronic diastolic heart failure: Presents with evidence of volume overload and running out of his Lasix  2 days ago.  Primarily abdominal distention with evidence of elevated JVP.  He also drinks an excessive amount of volume, reports that he fills his water  bottle 8-9 times a day. - Instructed to try to cut down on fluid intake, ideally around 64 ounces - Stop Lasix , start torsemide 40 mg daily - Take torsemide 40 mg twice daily for the next 5 days - Continue spironolactone  25 mg daily, Jardiance  25 mg daily - Has follow-up arranged in around 2 weeks, can check labs at that time  Chest discomfort: Started around the same time as his heart failure symptoms, potentially a little bit before.  Review of his chest CTs shows extensive coronary artery calcifications, not surprising given his PAD history.  5-FU can cause coronary vasospasm and chest pain, but does not appear to be related to the medication. - Given mildly elevated blood pressure and high suspicion for cardiac angina we will start on 5 mg amlodipine daily - Functional testing with PET scan ordered - Continue atorvastatin  20 mg daily - Discussed coronary vasospasm symptoms with patient  PAD: - Has had prior stenting, reports some leg  discomfort has persisted - Consider ABIs at next visit if not improved after diuresis  Stage 2 colon cancer- - Colonoscopy 08/25/23 with a partially obstructing large mass in the transverse colon, likely malignant, biopsied. Surgery consulted.  - Chest/ abdomen/ pelvic CT 08/25/23 showing 4mm nodule left lung fissure, 5mm nodule right lung fissure favored benign, colon mass. (Per report) -  Hg 09/21/23 was 9.5 - right colectomy 09/14/23 - Now plan for 6 months of 5-FU  Follow up in 2 weeks with APP. Can be followed in [facility], MD Advanced Heart Failure Mechanical Circulatory Support 11/07/23

## 2023-11-07 NOTE — Telephone Encounter (Signed)
 Patient just checked in to see Mr. Sherleen for mucositis.

## 2023-11-07 NOTE — Telephone Encounter (Signed)
 Per Dr. Melanee He can be seen in sms. Please prescribe MMW in the meantime.  Detailed voice message indicating patient has an appointment with cardiologist Dr. Morene Brownie this morning at 9:45am; on message asked if he would like to be seen by [facility] either before or after [facility] appt.  Left call back number [phone] for Heron to assist with scheduling.

## 2023-11-07 NOTE — Progress Notes (Signed)
 Symptom Management Clinic [facility] at [facility] [phone] [phone]  Patient Care Team: [name], MD as PCP - General (Family Medicine) Fate Morna SAILOR, [facility] (Inactive) as Pharmacist (Pharmacist) Maurie Rayfield BIRCH, RN as Oncology Nurse Navigator [name], MD as Consulting Physician (Oncology)   NAME OF PATIENT: Erik Martinez  [id number]  1955-09-08   DATE OF VISIT: 11/07/23  REASON FOR CONSULT: ALYAS CREARY is a 68 y.o. male with multiple medical problems including stage II colon cancer status post resection.  Currently on adjuvant 5-FU chemotherapy.  INTERVAL HISTORY: Patient presents to [facility] for evaluation of oral pain.  Patient endorses oral sores, which started when he received chemotherapy.  He has had pain with eating.  He reports that he is drinking fluids well.  Denies any dizziness.  Denies weakness.  Patient saw cardiology earlier today.  Denies any neurologic complaints. Denies recent fevers or illnesses. Denies any easy bleeding or bruising.  Denies any nausea, vomiting, constipation, or diarrhea. Denies urinary complaints. Patient offers no further specific complaints today.   PAST MEDICAL HISTORY: Past Medical History:  Diagnosis Date   (HFpEF) heart failure with preserved ejection fraction (HCC)    Adenocarcinoma, colon (HCC) 08/25/2023   Anemia    Anxiety    Aortic atherosclerosis    Arthritis    Avascular necrosis of bones of both hips (HCC)    Bipolar disorder (HCC)    Bladder spasms    Chronic pain of left knee    Chronic pain syndrome    a.) followed by pain clinic in [place]   Chronic, continuous use of opioids    a.) managed by pain clinic in [place]   DDD (degenerative disc disease)    Depression    Erectile dysfunction    a.) on PDE5i (tadalafil)   Frequency-urgency syndrome    GERD (gastroesophageal reflux disease)    Hepatitis C 2018   History of anesthesia complications    a.)  PONV; b.) PDPH; c.) difficult to sedate   History of hepatitis B    1983  TX'D--  NO ISSUES OR SYMPTOMS SINCE   Hyperlipidemia    Hypotestosteronemia    Insomnia    a.) on hypnotic PRN (zolpidem )   Multiple lung nodules on CT    a.) CT CAP 08/25/2023: along oblique fissures - favored to be benign   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   Neurogenic bladder    Nocturia    PAD (peripheral artery disease)    Polysubstance abuse (HCC)    a.) cocaine + marijuana + BZO + opioids   PONV (postoperative nausea and vomiting)    Spinal headache    T2DM (type 2 diabetes mellitus) (HCC)    Urine incontinence     PAST SURGICAL HISTORY:  Past Surgical History:  Procedure Laterality Date   APPLICATION OF WOUND VAC Left 09/11/2021   Procedure: APPLICATION OF WOUND VAC;  Surgeon: [name], MD;  Location: [facility] ORS;  Service: Orthopedics;  Laterality: Left;  [id number]    BACK SURGERY     2 rods and 4 screws artificial disc   COLONOSCOPY  2008   COLONOSCOPY N/A 08/25/2023   Procedure: COLONOSCOPY;  Surgeon: [name], MD;  Location: [facility] ENDOSCOPY;  Service: Endoscopy;  Laterality: N/A;   ESOPHAGOGASTRODUODENOSCOPY N/A 08/25/2023   Procedure: EGD (ESOPHAGOGASTRODUODENOSCOPY);  Surgeon: [name], MD;  Location: [facility] ENDOSCOPY;  Service: Endoscopy;  Laterality: N/A;   INTERSTIM IMPLANT PLACEMENT  2007  INTERSTIM IMPLANT REVISION N/A 08/17/2012   Procedure: REPLACMENT OF IPG PLUS REPLACE LEAD OF INTERSTIM IMPLANT ;  Surgeon: [name], MD;  Location[facility];  Service: Urology;  Laterality: N/A;   IRRIGATION AND DEBRIDEMENT KNEE Left 09/11/2021   Procedure: IRRIGATION AND DEBRIDEMENT WITH POLY EXCHANGE LEFT KNEE;  Surgeon: [name], MD;  Location: [facility] ORS;  Service: Orthopedics;  Laterality: Left;   IRRIGATION AND DEBRIDEMENT KNEE Left 06/01/2022   Procedure: IRRIGATION AND DEBRIDEMENT KNEE;  Surgeon: [name], MD;  Location: [facility] ORS;  Service:  Orthopedics;  Laterality: Left;   LOWER EXTREMITY ANGIOGRAPHY Left 11/17/2020   Procedure: LOWER EXTREMITY ANGIOGRAPHY;  Surgeon: [name], MD;  Location: [facility] INVASIVE CV LAB;  Service: Cardiovascular;  Laterality: Left;   LOWER EXTREMITY ANGIOGRAPHY Right 12/22/2020   Procedure: LOWER EXTREMITY ANGIOGRAPHY;  Surgeon: [name], MD;  Location: [facility];  Service: Cardiovascular;  Laterality: Right;   LOWER EXTREMITY ANGIOGRAPHY Left 12/14/2021   Procedure: Lower Extremity Angiography;  Surgeon: [name], MD;  Location: [facility] LAB;  Service: Cardiovascular;  Laterality: Left;   LUMBAR DISC SURGERY  2005   L5   LUMBAR FUSION  X2  2006  &  2007   MANIPULATION KNEE JOINT Left 08/2002   OPEN DEBRIDEMENT LEFT TOTAL KNEE (SCAR, BONEY GRAOWTH)/ REMOVAL OLD SUTURES  05/01/2007   PARTIAL HIP ARTHROPLASTY Left 2001   x2   PORTACATH PLACEMENT N/A 10/27/2023   Procedure: INSERTION, TUNNELED CENTRAL VENOUS DEVICE, WITH PORT;  Surgeon: Tye Millet, DO;  Location: [facility] ORS;  Service: General;  Laterality: N/A;   SHOULDER OPEN ROTATOR CUFF REPAIR Left 2000   TOTAL HIP ARTHROPLASTY Left 2002   TOTAL KNEE ARTHROPLASTY Left 06/2002   PARTIAL LEFT KNEE REPLACEMENT PRIOR TO THIS   TOTAL KNEE REVISION Left 2008   TOTAL KNEE REVISION Left 07/19/2021   Procedure: TOTAL KNEE REVISION;  Surgeon: [name], MD;  Location: [facility];  Service: Orthopedics;  Laterality: Left;   TOTAL KNEE REVISION Left 2005   TOTAL KNEE REVISION Left 06/01/2022   Procedure: TOTAL KNEE REVISION;  Surgeon: [name], MD;  Location: [facility];  Service: Orthopedics;  Laterality: Left;   TOTAL KNEE REVISION Left 04/01/2022   Procedure: Removal of left total knee implants with insertion of spacer.;  Surgeon: [name], MD;  Location: [facility];  Service: Orthopedics;  Laterality: Left;   TRIGGER FINGER RELEASE Bilateral    SEVERAL FINGERS   TRIGGER FINGER RELEASE Right  06/18/2014   Procedure: RELEASE TRIGGER FINGER/A-1 PULLEY;  Surgeon: Lynwood SQUIBB Mardee, MD;  Location: [facility];  Service: Orthopedics;  Laterality: Right;    HEMATOLOGY/ONCOLOGY HISTORY:  Oncology History  Colon cancer (HCC)  09/13/2023 Initial Diagnosis   Colon cancer (HCC)   09/26/2023 Cancer Staging   Staging form: Colon and Rectum, AJCC 8th Edition - Pathologic stage from 09/26/2023: Stage IIB (pT4a, pN0, cM0) - Signed by [name], MD on 09/26/2023 Total positive nodes: 0 Histologic grading system: 4 grade system Histologic grade (G): G2 Residual tumor (R): R0   11/01/2023 -  Chemotherapy   Patient is on Treatment Plan : COLORECTAL 5FU + Leucovorin (Modified DeGramont) q14d X 12 Cycles       ALLERGIES:  is allergic to suboxone [buprenorphine hcl-naloxone  hcl], tape, codeine, morphine  and codeine, other, and silicone.  MEDICATIONS:  Current Outpatient Medications  Medication Sig Dispense Refill   acetaminophen  (TYLENOL ) 325 MG  tablet Take 2 tablets (650 mg total) by mouth every 8 (eight) hours as needed for mild pain (pain score 1-3). 40 tablet 0   amLODipine (NORVASC) 5 MG tablet Take 1 tablet (5 mg total) by mouth daily. 180 tablet 3   atorvastatin  (LIPITOR) 20 MG tablet Take 20 mg by mouth at bedtime.     celecoxib  (CELEBREX ) 200 MG capsule Take 1 capsule (200 mg total) by mouth 2 (two) times daily.     Continuous Glucose Sensor (FREESTYLE LIBRE 3 PLUS SENSOR) MISC CHANCE SENSOR EVERY 15 DAYS 2 each 3   cyclobenzaprine  (FLEXERIL ) 10 MG tablet Take 10 mg by mouth 3 (three) times daily.     diazepam  (VALIUM ) 5 MG tablet Take 1 tablet (5 mg total) by mouth at bedtime. 30 tablet 0   empagliflozin  (JARDIANCE ) 25 MG TABS tablet Take 1 tablet (25 mg total) by mouth daily. 90 tablet 1   insulin  aspart (NOVOLOG ) 100 UNIT/ML injection Per sliding scale  MAX 15 units in 24 hour period. 10 mL PRN   iron  polysaccharides (NIFEREX) 150 MG capsule Take 1 capsule (150 mg total) by mouth  daily. 30 capsule 0   lidocaine  (LIDODERM ) 5 % Place 3 patches onto the skin daily. Remove & Discard patch within 12 hours or as directed by MD 90 patch 0   lidocaine -prilocaine (EMLA) cream Apply to affected area once 30 g 3   lubiprostone  (AMITIZA ) 8 MCG capsule Take 1 capsule (8 mcg total) by mouth 3 (three) times daily. 90 capsule 1   ondansetron  (ZOFRAN ) 8 MG tablet Take 1 tablet (8 mg total) by mouth every 8 (eight) hours as needed for nausea or vomiting. 30 tablet 1   Oxycodone  HCl 20 MG TABS Take 1 tablet by mouth every 4 (four) hours as needed (Pain).     pantoprazole  (PROTONIX ) 40 MG tablet Take 1 tablet (40 mg total) by mouth daily. 90 tablet 3   polyethylene glycol (MIRALAX  / GLYCOLAX ) 17 g packet Take 17 g by mouth daily as needed for mild constipation. 14 each 0   pregabalin  (LYRICA ) 200 MG capsule Take 1 capsule (200 mg total) by mouth in the morning, at noon, and at bedtime. 90 capsule 0   prochlorperazine (COMPAZINE) 10 MG tablet Take 1 tablet (10 mg total) by mouth every 6 (six) hours as needed for nausea or vomiting. 30 tablet 1   Semaglutide , 2 MG/DOSE, (OZEMPIC , 2 MG/DOSE,) 8 MG/3ML SOPN Inject 2 mg into the skin once a week. Monday     spironolactone  (ALDACTONE ) 25 MG tablet Take 1 tablet (25 mg total) by mouth daily. 90 tablet 3   tadalafil (CIALIS) 20 MG tablet Take 20 mg by mouth daily as needed for erectile dysfunction.     terbinafine  (LAMISIL ) 250 MG tablet TAKE 1 TABLET BY MOUTH ONCE DAILY 90 tablet 0   torsemide (DEMADEX) 20 MG tablet Take 2 tablets (40 mg total) by mouth daily. 60 tablet 5   triamcinolone  cream (KENALOG ) 0.5 % Apply 1 Application topically 2 (two) times daily. 30 g 0   zolpidem  (AMBIEN ) 10 MG tablet Take 1 tablet (10 mg total) by mouth at bedtime. 30 tablet 0   diazepam  (VALIUM ) 5 MG tablet Take 1 tablet (5 mg total) by mouth at bedtime. 30 tablet 0   No current facility-administered medications for this visit.    VITAL SIGNS: BP 116/63   Pulse  79   Temp 97.8 F (36.6 C) (Tympanic)   Resp 18  Ht 5' 9 (1.753 m)   Wt 188 lb 8 oz (85.5 kg)   SpO2 100%   BMI 27.84 kg/m  Filed Weights   11/07/23 1116  Weight: 188 lb 8 oz (85.5 kg)    Estimated body mass index is 27.84 kg/m as calculated from the following:   Height as of this encounter: 5' 9 (1.753 m).   Weight as of this encounter: 188 lb 8 oz (85.5 kg).  LABS: CBC:    Component Value Date/Time   WBC 7.1 11/01/2023 0851   WBC 6.6 09/21/2023 0534   HGB 8.6 (L) 11/01/2023 0851   HGB 13.4 05/23/2013 0329   HCT 27.9 (L) 11/01/2023 0851   HCT 38.5 (L) 05/23/2013 0329   PLT 245 11/01/2023 0851   PLT 153 05/23/2013 0329   MCV 68.9 (L) 11/01/2023 0851   MCV 86 05/23/2013 0329   NEUTROABS 4.3 11/01/2023 0851   NEUTROABS 5.10 11/19/2020 0000   NEUTROABS 4.7 05/23/2013 0329   LYMPHSABS 1.3 11/01/2023 0851   LYMPHSABS 1.7 05/23/2013 0329   MONOABS 0.8 11/01/2023 0851   MONOABS 0.4 05/23/2013 0329   EOSABS 0.6 (H) 11/01/2023 0851   EOSABS 0.2 05/23/2013 0329   BASOSABS 0.0 11/01/2023 0851   BASOSABS 0.0 05/23/2013 0329   Comprehensive Metabolic Panel:    Component Value Date/Time   NA 133 (L) 11/01/2023 0851   NA 138 11/19/2020 0000   NA 142 05/23/2013 0329   K 3.7 11/01/2023 0851   K 3.8 05/23/2013 0329   CL 100 11/01/2023 0851   CL 109 (H) 05/23/2013 0329   CO2 25 11/01/2023 0851   CO2 27 05/23/2013 0329   BUN 20 11/01/2023 0851   BUN 19 11/19/2020 0000   BUN 5 (L) 05/23/2013 0329   CREATININE 0.96 11/01/2023 0851   CREATININE 0.98 05/27/2013 0301   GLUCOSE 161 (H) 11/01/2023 0851   GLUCOSE 151 (H) 05/23/2013 0329   CALCIUM  8.1 (L) 11/01/2023 0851   CALCIUM  8.5 05/23/2013 0329   AST 18 11/01/2023 0851   ALT 8 11/01/2023 0851   ALT 32 05/21/2013 0530   ALKPHOS 90 11/01/2023 0851   ALKPHOS 51 05/21/2013 0530   BILITOT 0.5 11/01/2023 0851   PROT 7.0 11/01/2023 0851   PROT 6.2 (L) 05/21/2013 0530   ALBUMIN 3.1 (L) 11/01/2023 0851   ALBUMIN 2.9 (L)  05/21/2013 0530    RADIOGRAPHIC STUDIES: DG CHEST PORT 1 VIEW Result Date: 10/27/2023 CLINICAL DATA:  Port-A-Cath placement. EXAM: PORTABLE CHEST 1 VIEW COMPARISON:  08/22/2023 FINDINGS: The cardio pericardial silhouette is enlarged. Vascular congestion noted without overt airspace pulmonary edema. No dense focal airspace consolidation. No pleural effusion. Right Port-A-Cath tip overlies the upper right atrium. No evidence for pneumothorax. IMPRESSION: Right Port-A-Cath tip overlies the upper right atrium. No pneumothorax. Electronically Signed   By: Camellia Candle M.D.   On: 10/27/2023 14:55   DG C-Arm 1-60 Min-No Report Result Date: 10/27/2023 Fluoroscopy was utilized by the requesting physician.  No radiographic interpretation.    PERFORMANCE STATUS (ECOG) : 1 - Symptomatic but completely ambulatory  Review of Systems Unless otherwise noted, a complete review of systems is negative.  Physical Exam General: NAD HEENT: Multiple oral ulcerations, no evidence of thrush Cardiovascular: regular rate and rhythm Pulmonary: clear ant fields Abdomen: soft, nontender, + bowel sounds GU: no suprapubic tenderness Extremities: no edema, no joint deformities Skin: no rashes Neurological: nonfocal  IMPRESSION/PLAN: Colon cancer -on 5-FU chemotherapy  Mucositis -secondary to chemotherapy.  Will start on Magic  mouthwash.  Also recommended oral care with baking soda/salt rinses.  If no improvement, could consider steroid oral rinse.  Will check DPYD mutation.   Follow-up next week with Dr. Melanee   Patient expressed understanding and was in agreement with this plan. He also understands that He can call clinic at any time with any questions, concerns, or complaints.   Thank you for allowing me to participate in the care of this very pleasant patient.   Time Total: 15 minutes  Visit consisted of counseling and education dealing with the complex and emotionally intense issues of symptom  management in the setting of serious illness.Greater than 50%  of this time was spent counseling and coordinating care related to the above assessment and plan.  Signed by: Fonda Mower, PhD, NP-C

## 2023-11-07 NOTE — Patient Instructions (Signed)
 Medication Changes:  STOP Furosemide   START Torsemide 40mg  (2 tabs) daily  START Amlodipine 5mg  daily   Testing/Procedures:     Please report to Radiology at the [facility] Main Entrance 30 minutes early for your test.  [address] [place], [place] [address]                         OR   Please report to Radiology at [facility] Main Entrance, medical mall, 30 mins prior to your test.  [address]  [place], [place]  How to Prepare for Your Cardiac PET/CT Stress Test:  Nothing to eat or drink, except water , 3 hours prior to arrival time.  NO caffeine/decaffeinated products, or chocolate 12 hours prior to arrival. (Please note decaffeinated beverages (teas/coffees) still contain caffeine).  If you have caffeine within 12 hours prior, the test will need to be rescheduled.  Medication instructions: Do not take erectile dysfunction medications for 72 hours prior to test (sildenafil, tadalafil) Do not take nitrates (isosorbide mononitrate, Ranexa) the day before or day of test Do not take tamsulosin the day before or morning of test Hold theophylline containing medications for 12 hours. Hold Dipyridamole 48 hours prior to the test.  Diabetic Preparation: If able to eat breakfast prior to 3 hour fasting, you may take all medications, including your insulin . Do not worry if you miss your breakfast dose of insulin  - start at your next meal. If you do not eat prior to 3 hour fast-Hold all diabetes (oral and insulin ) medications. Patients who wear a continuous glucose monitor MUST remove the device prior to scanning.  You may take your remaining medications with water .  NO perfume, cologne or lotion on chest or abdomen area. FEMALES - Please avoid wearing dresses to this appointment.  Total time is 1 to 2 hours; you may want to bring reading material for the waiting time.  IF YOU THINK YOU MAY BE PREGNANT, OR ARE NURSING PLEASE INFORM  THE TECHNOLOGIST.  In preparation for your appointment, medication and supplies will be purchased.  Appointment availability is limited, so if you need to cancel or reschedule, please call the Radiology Department Scheduler at [phone] 24 hours in advance to avoid a cancellation fee of $100.00  What to Expect When you Arrive:  Once you arrive and check in for your appointment, you will be taken to a preparation room within the Radiology Department.  A technologist or Nurse will obtain your medical history, verify that you are correctly prepped for the exam, and explain the procedure.  Afterwards, an IV will be started in your arm and electrodes will be placed on your skin for EKG monitoring during the stress portion of the exam. Then you will be escorted to the PET/CT scanner.  There, staff will get you positioned on the scanner and obtain a blood pressure and EKG.  During the exam, you will continue to be connected to the EKG and blood pressure machines.  A small, safe amount of a radioactive tracer will be injected in your IV to obtain a series of pictures of your heart along with an injection of a stress agent.    After your Exam:  It is recommended that you eat a meal and drink a caffeinated beverage to counter act any effects of the stress agent.  Drink plenty of fluids for the remainder of the day and urinate frequently for the first couple of hours  after the exam.  Your doctor will inform you of your test results within 7-10 business days.  For more information and frequently asked questions, please visit our website: [email or web address]  For questions about your test or how to prepare for your test, please call: Cardiac Imaging Nurse Navigators Office: [phone]     Follow-Up in: Please keep follow up appointment with Ellouise Class, FNP.   Thank you for choosing [facility].    At the [facility], you and  your health needs are our priority. We have a designated team specialized in the treatment of Heart Failure. This Care Team includes your primary Heart Failure Specialized Cardiologist (physician), Advanced Practice Providers (APPs- Physician Assistants and Nurse Practitioners), and Pharmacist who all work together to provide you with the care you need, when you need it.   You may see any of the following providers on your designated Care Team at your next follow up:  Dr. Toribio Fuel Dr. Ezra Shuck Dr. Ria Commander Dr. Morene Brownie Ellouise Class, FNP Jaun Bash, RPH-CPP  Please be sure to bring in all your medications bottles to every appointment.   Need to Contact Us :  If you have any questions or concerns before your next appointment please send us  a message through [place] or call our office at [phone].    TO LEAVE A MESSAGE FOR THE NURSE SELECT OPTION 2, PLEASE LEAVE A MESSAGE INCLUDING: YOUR NAME DATE OF BIRTH CALL BACK NUMBER REASON FOR CALL**this is important as we prioritize the call backs  YOU WILL RECEIVE A CALL BACK THE SAME DAY AS LONG AS YOU CALL BEFORE 4:00 PM

## 2023-11-08 ENCOUNTER — Telehealth: Payer: Self-pay | Admitting: *Deleted

## 2023-11-08 ENCOUNTER — Telehealth: Payer: Self-pay

## 2023-11-08 NOTE — Telephone Encounter (Signed)
 Clinical Social Work received a referral that patient requested information about lawyers regarding eviction.  CSW attempted to call patient.  Left a voicemail with contact information and request for a call back.

## 2023-11-08 NOTE — Telephone Encounter (Signed)
 The patient is asking to see the social worker Duffy to see if they have ever have anything for eviction and wanted to know if she has any information for a while here because of eviction.

## 2023-11-09 ENCOUNTER — Encounter: Payer: Self-pay | Admitting: Oncology

## 2023-11-10 ENCOUNTER — Encounter: Payer: Self-pay | Admitting: Oncology

## 2023-11-11 ENCOUNTER — Other Ambulatory Visit: Payer: Self-pay | Admitting: Oncology

## 2023-11-11 MED ORDER — INV-VIKTORIA-1-DEXAMETHASONE ORAL SOLUTION FOR MOUTH RINSE (PI:GUDENA): 10.0000 mL | Freq: Four times a day (QID) | ORAL | 3 refills | Status: DC

## 2023-11-13 ENCOUNTER — Other Ambulatory Visit: Payer: Self-pay

## 2023-11-13 ENCOUNTER — Telehealth: Payer: Self-pay

## 2023-11-13 ENCOUNTER — Telehealth: Payer: Self-pay | Admitting: *Deleted

## 2023-11-13 NOTE — Telephone Encounter (Signed)
 Spoke with higher education careers adviser. They do not have inv-Viktoria dex mouth rinse.  We have been unable to get in touch w/patient.

## 2023-11-13 NOTE — Telephone Encounter (Signed)
 Left message requesting patient to call office to provide an alternative contact number.

## 2023-11-13 NOTE — Telephone Encounter (Signed)
 Looks like he called back and left a engineer, technical sales for [place]?  Is there a better number to reach him

## 2023-11-13 NOTE — Telephone Encounter (Signed)
 Attempted to contact patient to discuss need a lab/[facility] visit today.  No answer, message left asking him to call the office and that a previous MyChart message has been sent.

## 2023-11-13 NOTE — Telephone Encounter (Signed)
 Voicemail received today 11/13/23 at 1:15pm from patient stating he is in a lot of pain d/t mucositis, that Dr. Melanee was supposed to send something over the weekend, patient very emotional at time of call.  Best contact number [phone].  Touched base with Dr. Melanee who indicated she already spoke with Powell DEL. To follow up with patient. Touched based with Powell DEL. who indicated she was unable to get in touch with patient, he does not answer his phone. Powell also mentioned Powell Sharps attempted to contact the patient but still no answer.

## 2023-11-13 NOTE — Telephone Encounter (Signed)
 Patient calling checking on status of rx that Dr. Melanee was suppose to send to pharmacy based on an on-call over the weekend phone call.

## 2023-11-13 NOTE — Telephone Encounter (Signed)
 Left another voice mail for patient. His calls go directly to vm. Attempting to reach patient w/ no success.

## 2023-11-13 NOTE — Telephone Encounter (Signed)
 Patient called office again requesting rx be sent to pharmacy.  Returned call but went to voicemail.  Message left informing patient that we have tried contacting  him multiple times today to schedule appt.  Requested patient call the office to schedule.

## 2023-11-13 NOTE — Telephone Encounter (Signed)
 Dr. Melanee requested that I reach out to patient this morning. I attempted to reach patient to follow-up on mucositis symptoms. MD also needs the DPYD genotyping as well prior to treatment appointments if possible. Left vm for patient. I asked pt to return my phone call. We need pt to come in today for labs & [facility] for further evaluation. Mychart msg also sent to patient.

## 2023-11-14 ENCOUNTER — Encounter: Payer: Self-pay | Admitting: Oncology

## 2023-11-14 ENCOUNTER — Other Ambulatory Visit: Payer: Self-pay | Admitting: Family Medicine

## 2023-11-14 ENCOUNTER — Encounter: Payer: Self-pay | Admitting: Nurse Practitioner

## 2023-11-14 ENCOUNTER — Inpatient Hospital Stay: Attending: Oncology

## 2023-11-14 ENCOUNTER — Inpatient Hospital Stay

## 2023-11-14 ENCOUNTER — Other Ambulatory Visit: Payer: Self-pay

## 2023-11-14 ENCOUNTER — Inpatient Hospital Stay (HOSPITAL_BASED_OUTPATIENT_CLINIC_OR_DEPARTMENT_OTHER): Admitting: Nurse Practitioner

## 2023-11-14 VITALS — BP 127/72 | HR 86 | Temp 98.5°F | Resp 20 | Ht 69.0 in | Wt 173.5 lb

## 2023-11-14 DIAGNOSIS — Z5941 Food insecurity: Secondary | ICD-10-CM | POA: Diagnosis not present

## 2023-11-14 DIAGNOSIS — K1231 Oral mucositis (ulcerative) due to antineoplastic therapy: Secondary | ICD-10-CM

## 2023-11-14 DIAGNOSIS — M1990 Unspecified osteoarthritis, unspecified site: Secondary | ICD-10-CM | POA: Diagnosis not present

## 2023-11-14 DIAGNOSIS — Z801 Family history of malignant neoplasm of trachea, bronchus and lung: Secondary | ICD-10-CM | POA: Diagnosis not present

## 2023-11-14 DIAGNOSIS — Z79899 Other long term (current) drug therapy: Secondary | ICD-10-CM | POA: Diagnosis not present

## 2023-11-14 DIAGNOSIS — Z8261 Family history of arthritis: Secondary | ICD-10-CM | POA: Diagnosis not present

## 2023-11-14 DIAGNOSIS — T451X5A Adverse effect of antineoplastic and immunosuppressive drugs, initial encounter: Secondary | ICD-10-CM | POA: Diagnosis not present

## 2023-11-14 DIAGNOSIS — Z8619 Personal history of other infectious and parasitic diseases: Secondary | ICD-10-CM | POA: Diagnosis not present

## 2023-11-14 DIAGNOSIS — Z811 Family history of alcohol abuse and dependence: Secondary | ICD-10-CM | POA: Diagnosis not present

## 2023-11-14 DIAGNOSIS — Z599 Problem related to housing and economic circumstances, unspecified: Secondary | ICD-10-CM | POA: Diagnosis not present

## 2023-11-14 DIAGNOSIS — R5383 Other fatigue: Secondary | ICD-10-CM | POA: Diagnosis not present

## 2023-11-14 DIAGNOSIS — E860 Dehydration: Secondary | ICD-10-CM

## 2023-11-14 DIAGNOSIS — Z79891 Long term (current) use of opiate analgesic: Secondary | ICD-10-CM | POA: Diagnosis not present

## 2023-11-14 DIAGNOSIS — E785 Hyperlipidemia, unspecified: Secondary | ICD-10-CM | POA: Diagnosis not present

## 2023-11-14 DIAGNOSIS — F32A Depression, unspecified: Secondary | ICD-10-CM | POA: Diagnosis not present

## 2023-11-14 DIAGNOSIS — K1230 Oral mucositis (ulcerative), unspecified: Secondary | ICD-10-CM

## 2023-11-14 DIAGNOSIS — C189 Malignant neoplasm of colon, unspecified: Secondary | ICD-10-CM

## 2023-11-14 DIAGNOSIS — J029 Acute pharyngitis, unspecified: Secondary | ICD-10-CM | POA: Diagnosis not present

## 2023-11-14 DIAGNOSIS — I5032 Chronic diastolic (congestive) heart failure: Secondary | ICD-10-CM | POA: Diagnosis not present

## 2023-11-14 DIAGNOSIS — Z885 Allergy status to narcotic agent status: Secondary | ICD-10-CM | POA: Diagnosis not present

## 2023-11-14 DIAGNOSIS — Z83438 Family history of other disorder of lipoprotein metabolism and other lipidemia: Secondary | ICD-10-CM | POA: Diagnosis not present

## 2023-11-14 DIAGNOSIS — F419 Anxiety disorder, unspecified: Secondary | ICD-10-CM | POA: Diagnosis not present

## 2023-11-14 DIAGNOSIS — Z833 Family history of diabetes mellitus: Secondary | ICD-10-CM | POA: Diagnosis not present

## 2023-11-14 DIAGNOSIS — Z823 Family history of stroke: Secondary | ICD-10-CM | POA: Diagnosis not present

## 2023-11-14 DIAGNOSIS — R634 Abnormal weight loss: Secondary | ICD-10-CM | POA: Diagnosis not present

## 2023-11-14 LAB — CBC WITH DIFFERENTIAL (CANCER CENTER ONLY)
Abs Immature Granulocytes: 0.02 K/uL (ref 0.00–0.07)
Basophils Absolute: 0 K/uL (ref 0.0–0.1)
Basophils Relative: 0 %
Eosinophils Absolute: 0 K/uL (ref 0.0–0.5)
Eosinophils Relative: 0 %
HCT: 29.6 % — ABNORMAL LOW (ref 39.0–52.0)
Hemoglobin: 9.1 g/dL — ABNORMAL LOW (ref 13.0–17.0)
Immature Granulocytes: 0 %
Lymphocytes Relative: 15 %
Lymphs Abs: 1 K/uL (ref 0.7–4.0)
MCH: 21 pg — ABNORMAL LOW (ref 26.0–34.0)
MCHC: 30.7 g/dL (ref 30.0–36.0)
MCV: 68.2 fL — ABNORMAL LOW (ref 80.0–100.0)
Monocytes Absolute: 0.4 K/uL (ref 0.1–1.0)
Monocytes Relative: 6 %
Neutro Abs: 5.3 K/uL (ref 1.7–7.7)
Neutrophils Relative %: 79 %
Platelet Count: 241 K/uL (ref 150–400)
RBC: 4.34 MIL/uL (ref 4.22–5.81)
RDW: 20.5 % — ABNORMAL HIGH (ref 11.5–15.5)
WBC Count: 6.8 K/uL (ref 4.0–10.5)
nRBC: 0 % (ref 0.0–0.2)

## 2023-11-14 LAB — CMP (CANCER CENTER ONLY)
ALT: 31 U/L (ref 0–44)
AST: 19 U/L (ref 15–41)
Albumin: 3.7 g/dL (ref 3.5–5.0)
Alkaline Phosphatase: 101 U/L (ref 38–126)
Anion gap: 12 (ref 5–15)
BUN: 21 mg/dL (ref 8–23)
CO2: 29 mmol/L (ref 22–32)
Calcium: 8.5 mg/dL — ABNORMAL LOW (ref 8.9–10.3)
Chloride: 88 mmol/L — ABNORMAL LOW (ref 98–111)
Creatinine: 1.36 mg/dL — ABNORMAL HIGH (ref 0.61–1.24)
GFR, Estimated: 57 mL/min — ABNORMAL LOW (ref >60)
Glucose, Bld: 270 mg/dL — ABNORMAL HIGH (ref 70–99)
Potassium: 4.2 mmol/L (ref 3.5–5.1)
Sodium: 129 mmol/L — ABNORMAL LOW (ref 135–145)
Total Bilirubin: 0.8 mg/dL (ref 0.0–1.2)
Total Protein: 8.4 g/dL — ABNORMAL HIGH (ref 6.5–8.1)

## 2023-11-14 MED ORDER — SODIUM CHLORIDE 0.9 % IV SOLN
INTRAVENOUS | Status: DC
  Filled 2023-11-14 (×2): qty 250

## 2023-11-14 MED ORDER — DEXAMETHASONE SOD PHOSPHATE PF 10 MG/ML IJ SOLN
5.0000 mg | Freq: Once | INTRAMUSCULAR | Status: AC
  Administered 2023-11-14: 5 mg via INTRAVENOUS

## 2023-11-14 MED ORDER — DEXAMETHASONE 0.5 MG/5ML PO SOLN: 1.0000 mg | Freq: Four times a day (QID) | ORAL | 0 refills | Status: DC

## 2023-11-14 MED ORDER — DEXAMETHASONE SOD PHOSPHATE PF 10 MG/ML IJ SOLN: 4.0000 mg | Freq: Once | INTRAMUSCULAR | Status: DC

## 2023-11-14 NOTE — Progress Notes (Signed)
 Symptom Management Clinic  [facility] A Department of the [place]. [facility] [address] [place], [place] [address] [phone] (phone) [phone] (fax)  Patient Care Team: [name], MD as PCP - General (Family Medicine) Fate Morna SAILOR, [facility] (Inactive) as Pharmacist (Pharmacist) Maurie Rayfield BIRCH, RN as Oncology Nurse Navigator [name], MD as Consulting Physician (Oncology)   Name of the patient: Erik Martinez  [id number]  January 20, 1955   Date of visit: 11/14/23  Diagnosis- Colon Cancer  Chief complaint/ Reason for visit- Mucositis   Heme/Onc history:  Oncology History  Colon cancer (HCC)  09/13/2023 Initial Diagnosis   Colon cancer (HCC)   09/26/2023 Cancer Staging   Staging form: Colon and Rectum, AJCC 8th Edition - Pathologic stage from 09/26/2023: Stage IIB (pT4a, pN0, cM0) - Signed by [name], MD on 09/26/2023 Total positive nodes: 0 Histologic grading system: 4 grade system Histologic grade (G): G2 Residual tumor (R): R0   11/01/2023 -  Chemotherapy   Patient is on Treatment Plan : COLORECTAL 5FU + Leucovorin (Modified DeGramont) q14d X 12 Cycles       Interval History- patient is a 68 year old male currently being treated with adjuvant 5-FU chemotherapy, status post cycle 1 on 11/01/2023, who presents to symptom management clinic for complaints of mucositis. Symptoms started 5 days ago. Pain is intolerable. Worried about losing weight. Eating mashed potatoes but painful. Worried that he can't wear his dentures. No improvement with oxycodone  and fentanyl  patches. Taking diazepam  with ambien  helped.   Review of systems- Review of Systems  Constitutional:  Positive for malaise/fatigue and weight loss. Negative for chills and fever.  HENT:  Positive for sore throat. Negative for congestion, ear pain, hearing loss, nosebleeds and tinnitus.   Eyes:  Negative for blurred vision, double  vision and redness.  Respiratory:  Negative for cough, hemoptysis, shortness of breath and wheezing.   Cardiovascular:  Negative for chest pain, palpitations and leg swelling.  Gastrointestinal:  Negative for abdominal pain, blood in stool, constipation, diarrhea, melena, nausea and vomiting.  Genitourinary:  Negative for dysuria and urgency.  Musculoskeletal:  Negative for back pain, falls, joint pain and myalgias.  Skin:  Negative for itching and rash.  Neurological:  Negative for dizziness, tingling, sensory change, loss of consciousness, weakness and headaches.  Endo/Heme/Allergies:  Negative for environmental allergies. Does not bruise/bleed easily.  Psychiatric/Behavioral:  Positive for depression. The patient is nervous/anxious and has insomnia.      Allergies  Allergen Reactions   Suboxone [Buprenorphine Hcl-Naloxone  Hcl] Nausea And Vomiting   Tape Other (See Comments)    Whelps *Paper Tape is ok    Codeine Rash, Itching and Nausea Only   Morphine  And Codeine Itching and Rash   Other Rash    Telemetry electrodes   Silicone Rash    Whelps -*Paper Tape is ok     Past Medical History:  Diagnosis Date   (HFpEF) heart failure with preserved ejection fraction (HCC)    Adenocarcinoma, colon (HCC) 08/25/2023   Anemia    Anxiety    Aortic atherosclerosis    Arthritis    Avascular necrosis of bones of both hips (HCC)    Bipolar disorder (HCC)    Bladder spasms    Chronic pain of left knee    Chronic pain syndrome    a.) followed by pain clinic in [place]   Chronic, continuous use of opioids    a.) managed by pain  clinic in [place]   DDD (degenerative disc disease)    Depression    Erectile dysfunction    a.) on PDE5i (tadalafil)   Frequency-urgency syndrome    GERD (gastroesophageal reflux disease)    Hepatitis C 2018   History of anesthesia complications    a.) PONV; b.) PDPH; c.) difficult to sedate   History of hepatitis B    1983  TX'D--  NO ISSUES OR SYMPTOMS SINCE    Hyperlipidemia    Hypotestosteronemia    Insomnia    a.) on hypnotic PRN (zolpidem )   Multiple lung nodules on CT    a.) CT CAP 08/25/2023: along oblique fissures - favored to be benign   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   Neurogenic bladder    Nocturia    PAD (peripheral artery disease)    Polysubstance abuse (HCC)    a.) cocaine + marijuana + BZO + opioids   PONV (postoperative nausea and vomiting)    Spinal headache    T2DM (type 2 diabetes mellitus) (HCC)    Urine incontinence     Past Surgical History:  Procedure Laterality Date   APPLICATION OF WOUND VAC Left 09/11/2021   Procedure: APPLICATION OF WOUND VAC;  Surgeon: [name], MD;  Location: [facility];  Service: Orthopedics;  Laterality: Left;  [id number]    BACK SURGERY     2 rods and 4 screws artificial disc   COLONOSCOPY  2008   COLONOSCOPY N/A 08/25/2023   Procedure: COLONOSCOPY;  Surgeon: [name], MD;  Location: [facility] ENDOSCOPY;  Service: Endoscopy;  Laterality: N/A;   ESOPHAGOGASTRODUODENOSCOPY N/A 08/25/2023   Procedure: EGD (ESOPHAGOGASTRODUODENOSCOPY);  Surgeon: [name], MD;  Location: [facility] ENDOSCOPY;  Service: Endoscopy;  Laterality: N/A;   INTERSTIM IMPLANT PLACEMENT  2007   INTERSTIM IMPLANT REVISION N/A 08/17/2012   Procedure: REPLACMENT OF IPG PLUS REPLACE LEAD OF INTERSTIM IMPLANT ;  Surgeon: [name], MD;  Location: [facility];  Service: Urology;  Laterality: N/A;   IRRIGATION AND DEBRIDEMENT KNEE Left 09/11/2021   Procedure: IRRIGATION AND DEBRIDEMENT WITH POLY EXCHANGE LEFT KNEE;  Surgeon: [name], MD;  Location: [facility];  Service: Orthopedics;  Laterality: Left;   IRRIGATION AND DEBRIDEMENT KNEE Left 06/01/2022   Procedure: IRRIGATION AND DEBRIDEMENT KNEE;  Surgeon: [name], MD;  Location: [facility];  Service: Orthopedics;  Laterality: Left;   LOWER EXTREMITY ANGIOGRAPHY Left 11/17/2020   Procedure: LOWER EXTREMITY ANGIOGRAPHY;  Surgeon: [name], MD;  Location: [facility];  Service: Cardiovascular;  Laterality: Left;   LOWER EXTREMITY ANGIOGRAPHY Right 12/22/2020   Procedure: LOWER EXTREMITY ANGIOGRAPHY;  Surgeon: [name], MD;  Location: [facility];  Service: Cardiovascular;  Laterality: Right;   LOWER EXTREMITY ANGIOGRAPHY Left 12/14/2021   Procedure: Lower Extremity Angiography;  Surgeon: [name], MD;  Location: [facility];  Service: Cardiovascular;  Laterality: Left;   LUMBAR DISC SURGERY  2005   L5   LUMBAR FUSION  X2  2006  &  2007   MANIPULATION KNEE JOINT Left 08/2002   OPEN DEBRIDEMENT LEFT TOTAL KNEE (SCAR, BONEY GRAOWTH)/ REMOVAL OLD SUTURES  05/01/2007   PARTIAL HIP ARTHROPLASTY Left 2001   x2   PORTACATH PLACEMENT N/A 10/27/2023   Procedure: INSERTION, TUNNELED CENTRAL VENOUS DEVICE, WITH PORT;  Surgeon: Tye Millet, DO;  Location: [facility];  Service: General;  Laterality: N/A;   SHOULDER OPEN ROTATOR CUFF REPAIR Left 2000   TOTAL HIP  ARTHROPLASTY Left 2002   TOTAL KNEE ARTHROPLASTY Left 06/2002   PARTIAL LEFT KNEE REPLACEMENT PRIOR TO THIS   TOTAL KNEE REVISION Left 2008   TOTAL KNEE REVISION Left 07/19/2021   Procedure: TOTAL KNEE REVISION;  Surgeon: [name], MD;  Location: [facility];  Service: Orthopedics;  Laterality: Left;   TOTAL KNEE REVISION Left 2005   TOTAL KNEE REVISION Left 06/01/2022   Procedure: TOTAL KNEE REVISION;  Surgeon: [name], MD;  Location: [facility];  Service: Orthopedics;  Laterality: Left;   TOTAL KNEE REVISION Left 04/01/2022   Procedure: Removal of left total knee implants with insertion of spacer.;  Surgeon: [name], MD;  Location: [facility];  Service: Orthopedics;  Laterality: Left;   TRIGGER FINGER RELEASE Bilateral    SEVERAL FINGERS   TRIGGER FINGER RELEASE Right 06/18/2014   Procedure: RELEASE TRIGGER FINGER/A-1 PULLEY;  Surgeon: Lynwood SQUIBB Mardee, MD;  Location: [facility];  Service: Orthopedics;   Laterality: Right;    Social History   Socioeconomic History   Marital status: Divorced    Spouse name: Not on file   Number of children: 3   Years of education: Not on file   Highest education level: Some college, no degree  Occupational History   Not on file  Tobacco Use   Smoking status: Never   Smokeless tobacco: Never  Vaping Use   Vaping status: Never Used  Substance and Sexual Activity   Alcohol use: Not Currently    Alcohol/week: 0.0 - 2.0 standard drinks of alcohol   Drug use: Not Currently    Types: Cocaine    Comment: last + cocaine UDS 07/2016   Sexual activity: Not Currently    Birth control/protection: None  Other Topics Concern   Not on file  Social History Narrative    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   Overall Financial Resource Strain (CARDIA)    Difficulty of Paying Living Expenses: Not hard at all  Food Insecurity: No Food Insecurity (09/26/2023)   Hunger Vital Sign    Worried About Running Out of Food in the Last Year: Never true    Ran Out of Food in the Last Year: Never true  Recent Concern: Food Insecurity - Food Insecurity Present (07/11/2023)   Received from [facility]   Hunger Vital Sign    Within the past 12 months, you worried that your food would run out before you got the money to buy more.: Sometimes true    Within the past 12 months, the food you bought just didn't last and you didn't have money to get more.: Never true  Transportation Needs: No Transportation Needs (09/26/2023)   PRAPARE - Administrator, Civil Service (Medical): No    Lack of Transportation (Non-Medical): No  Physical Activity: Inactive (01/28/2022)   Exercise Vital Sign    Days of Exercise per Week: 0 days    Minutes of Exercise per Session: 0 min  Stress: No Stress Concern Present (01/28/2022)   [employer] of  Occupational Health - Occupational Stress Questionnaire    Feeling of Stress : Not at all  Social Connections: Moderately Isolated (09/13/2023)   Social Connection and Isolation Panel    Frequency of Communication with Friends and Family: More than three times a week    Frequency of Social Gatherings with  Friends and Family: More than three times a week    Attends Religious Services: More than 4 times per year    Active Member of Clubs or Organizations: No    Attends Banker Meetings: Never    Marital Status: Divorced  Catering Manager Violence: Not At Risk (09/26/2023)   Humiliation, Afraid, Rape, and Kick questionnaire    Fear of Current or Ex-Partner: No    Emotionally Abused: No    Physically Abused: No    Sexually Abused: No    Family History  Problem Relation Age of Onset   Diabetes Mother    Arthritis Mother    Cancer Mother        lung cancer   Hyperlipidemia Mother    Stroke Mother    Alcohol abuse Father    Cancer Father        lung   Arthritis Father    Diabetes Brother      Current Outpatient Medications:    amLODipine (NORVASC) 5 MG tablet, Take 1 tablet (5 mg total) by mouth daily., Disp: 180 tablet, Rfl: 3   atorvastatin  (LIPITOR) 20 MG tablet, Take 20 mg by mouth at bedtime., Disp: , Rfl:    celecoxib  (CELEBREX ) 200 MG capsule, Take 1 capsule (200 mg total) by mouth 2 (two) times daily., Disp: 180 capsule, Rfl: 0   Continuous Glucose Sensor (FREESTYLE LIBRE 3 PLUS SENSOR) MISC, CHANCE SENSOR EVERY 15 DAYS, Disp: 2 each, Rfl: 3   cyclobenzaprine  (FLEXERIL ) 10 MG tablet, Take 10 mg by mouth 3 (three) times daily., Disp: , Rfl:    dexamethasone  (DECADRON ) 0.5 MG/5ML solution, Take 10 mLs (1 mg total) by mouth in the morning, at noon, in the evening, and at bedtime. Swish 10 ml (1 mg total) of solution in mouth for 2 minutes then spit. Repeat every 6 hours. Do not swallow. Do not take anything by mouth for 1 hour after taking. For mucositis., Disp: 280  mL, Rfl: 0   diazepam  (VALIUM ) 5 MG tablet, Take 1 tablet (5 mg total) by mouth at bedtime., Disp: 30 tablet, Rfl: 0   empagliflozin  (JARDIANCE ) 25 MG TABS tablet, Take 1 tablet (25 mg total) by mouth daily., Disp: 90 tablet, Rfl: 1   insulin  aspart (NOVOLOG ) 100 UNIT/ML injection, Per sliding scale  MAX 15 units in 24 hour period., Disp: 10 mL, Rfl: PRN   iron  polysaccharides (NIFEREX) 150 MG capsule, Take 1 capsule (150 mg total) by mouth daily., Disp: 30 capsule, Rfl: 0   lidocaine  (LIDODERM ) 5 %, Place 3 patches onto the skin daily. Remove & Discard patch within 12 hours or as directed by MD, Disp: 90 patch, Rfl: 0   lubiprostone  (AMITIZA ) 8 MCG capsule, Take 1 capsule (8 mcg total) by mouth 3 (three) times daily., Disp: 90 capsule, Rfl: 1   magic mouthwash w/lidocaine  SOLN, Take 5 mLs by mouth 4 (four) times daily as needed for mouth pain. swish and swallow, Disp: 480 mL, Rfl: 3   ondansetron  (ZOFRAN ) 8 MG tablet, Take 1 tablet (8 mg total) by mouth every 8 (eight) hours as needed for nausea or vomiting., Disp: 30 tablet, Rfl: 1   Oxycodone  HCl 20 MG TABS, Take 1 tablet by mouth every 4 (four) hours as needed (Pain)., Disp: , Rfl:    pantoprazole  (PROTONIX ) 40 MG tablet, Take 1 tablet (40 mg total) by mouth daily., Disp: 90 tablet, Rfl: 3   pregabalin  (LYRICA ) 200 MG capsule, Take 1 capsule (200 mg total) by  mouth in the morning, at noon, and at bedtime., Disp: 90 capsule, Rfl: 0   prochlorperazine (COMPAZINE) 10 MG tablet, Take 1 tablet (10 mg total) by mouth every 6 (six) hours as needed for nausea or vomiting., Disp: 30 tablet, Rfl: 1   Semaglutide , 2 MG/DOSE, (OZEMPIC , 2 MG/DOSE,) 8 MG/3ML SOPN, Inject 2 mg into the skin once a week. Monday, Disp: , Rfl:    tadalafil (CIALIS) 20 MG tablet, Take 20 mg by mouth daily as needed for erectile dysfunction., Disp: , Rfl:    terbinafine  (LAMISIL ) 250 MG tablet, TAKE 1 TABLET BY MOUTH ONCE DAILY, Disp: 90 tablet, Rfl: 0   triamcinolone  cream  (KENALOG ) 0.5 %, Apply 1 Application topically 2 (two) times daily., Disp: 30 g, Rfl: 0   zolpidem  (AMBIEN ) 10 MG tablet, Take 1 tablet (10 mg total) by mouth at bedtime., Disp: 30 tablet, Rfl: 0   acetaminophen  (TYLENOL ) 325 MG tablet, Take 2 tablets (650 mg total) by mouth every 8 (eight) hours as needed for mild pain (pain score 1-3). (Patient not taking: Reported on 11/14/2023), Disp: 40 tablet, Rfl: 0   lidocaine -prilocaine (EMLA) cream, Apply to affected area once (Patient not taking: Reported on 11/14/2023), Disp: 30 g, Rfl: 3   polyethylene glycol (MIRALAX  / GLYCOLAX ) 17 g packet, Take 17 g by mouth daily as needed for mild constipation. (Patient not taking: Reported on 11/14/2023), Disp: 14 each, Rfl: 0   spironolactone  (ALDACTONE ) 25 MG tablet, Take 1 tablet (25 mg total) by mouth daily., Disp: 90 tablet, Rfl: 3   torsemide (DEMADEX) 20 MG tablet, Take 2 tablets (40 mg total) by mouth daily., Disp: 60 tablet, Rfl: 5 No current facility-administered medications for this visit.  Facility-Administered Medications Ordered in Other Visits:    0.9 %  sodium chloride  infusion, , Intravenous, Continuous, Dasie Tinnie MATSU, NP, Stopped at 11/14/23 1345  Physical exam:  Vitals:   11/14/23 1202  BP: 127/72  Pulse: 86  Resp: 20  Temp: 98.5 F (36.9 C)  TempSrc: Tympanic  Weight: 173 lb 8 oz (78.7 kg)  Height: 5' 9 (1.753 m)   Physical Exam Vitals reviewed.  Constitutional:      Appearance: He is not ill-appearing.  HENT:     Nose: No congestion or rhinorrhea.     Mouth/Throat:     Mouth: Mucous membranes are dry. Oral lesions (scattered pinpoint ulcerations on lips and mouth. Not significantly erythematous. No apparent yeast) present.     Pharynx: No posterior oropharyngeal erythema.  Skin:    Coloration: Skin is not pale.  Neurological:     Mental Status: He is alert and oriented to person, place, and time.         Latest Ref Rng & Units 11/14/2023   11:45 AM  CMP  Glucose 70 -  99 mg/dL 729   BUN 8 - 23 mg/dL 21   Creatinine 9.38 - 1.24 mg/dL 8.63   Sodium 864 - 854 mmol/L 129   Potassium 3.5 - 5.1 mmol/L 4.2   Chloride 98 - 111 mmol/L 88   CO2 22 - 32 mmol/L 29   Calcium  8.9 - 10.3 mg/dL 8.5   Total Protein 6.5 - 8.1 g/dL 8.4   Total Bilirubin 0.0 - 1.2 mg/dL 0.8   Alkaline Phos 38 - 126 U/L 101   AST 15 - 41 U/L 19   ALT 0 - 44 U/L 31       Latest Ref Rng & Units 11/14/2023   11:46 AM  CBC  WBC 4.0 - 10.5 K/uL 6.8   Hemoglobin 13.0 - 17.0 g/dL 9.1   Hematocrit 60.9 - 52.0 % 29.6   Platelets 150 - 400 K/uL 241    Assessment and plan- Patient is a 68 y.o. male currently status post cycle 1 of adjuvant 5-FU chemotherapy who presents to symptom management clinic for complaints of  Mouth pain/mucositis-grade 1.  Secondary to chemotherapy.  Refractory to Magic mouthwash.  Was not able to start topical dexamethasone  mouthwash.  No secondary fungal infection or apparent viral infection.  Recommend he continue baking soda rinses.  Good oral hygiene with a soft toothbrush.  Will plan to give IV fluids and IV dexamethasone  5 mg today.  I called the local pharmacy, [facility], and confirmed that they have dexamethasone  solution in stock.  Reviewed dosing of 10 mL swish for 2 minutes then spit 4 times a day.  DYPD mutation pending.  Labs today reviewed and no apparent neutropenia. Financial toxicity-Will refer to social work  Disposition: Follow-up with Dr. Melanee as scheduled or if symptoms do not improve or worsen in the interim.   Visit Diagnosis 1. Mucositis due to chemotherapy   2. Financial difficulties    Patient expressed understanding and was in agreement with this plan. He also understands that He can call clinic at any time with any questions, concerns, or complaints.   Thank you for allowing me to participate in the care of this very pleasant patient.   Tinnie Dawn, DNP, AGNP-C, AOCNP [facility] [phone]

## 2023-11-14 NOTE — Telephone Encounter (Signed)
 Copied from CRM [id number]. Topic: Clinical - Medication Refill >> Nov 14, 2023  3:56 PM Franky GRADE wrote: Medication: Semaglutide , 2 MG/DOSE, (OZEMPIC , 2 MG/DOSE,) 8 MG/3ML SOPN [[id number]]  Has the patient contacted their pharmacy? No, patient states he is enrolled in a program where he picks up the medication from the office.  (Agent: If no, request that the patient contact the pharmacy for the refill. If patient does not wish to contact the pharmacy document the reason why and proceed with request.) (Agent: If yes, when and what did the pharmacy advise?)  This is the patient's preferred pharmacy: Per patient he picks up the medication in the office.   Is this the correct pharmacy for this prescription? No If no, delete pharmacy and type the correct one.   Has the prescription been filled recently? No  Is the patient out of the medication? Yes  Has the patient been seen for an appointment in the last year OR does the patient have an upcoming appointment? Yes  Can we respond through MyChart? No, patient prefers a call.   Agent: Please be advised that Rx refills may take up to 3 business days. We ask that you follow-up with your pharmacy.

## 2023-11-15 ENCOUNTER — Inpatient Hospital Stay: Admitting: Oncology

## 2023-11-15 ENCOUNTER — Telehealth: Payer: Self-pay

## 2023-11-15 ENCOUNTER — Other Ambulatory Visit: Payer: Self-pay | Admitting: Oncology

## 2023-11-15 ENCOUNTER — Other Ambulatory Visit (HOSPITAL_COMMUNITY): Payer: Self-pay

## 2023-11-15 ENCOUNTER — Inpatient Hospital Stay

## 2023-11-15 NOTE — Telephone Encounter (Signed)
 Clinical Child Psychotherapist received referral from [employer].  Attempted to call patient.  Left a voicemail with callback information.

## 2023-11-15 NOTE — Telephone Encounter (Signed)
 Received a reill rquest from [facility] filled and faxed to provider office to sign and date  and faxed back

## 2023-11-16 ENCOUNTER — Other Ambulatory Visit: Payer: Self-pay | Admitting: Family Medicine

## 2023-11-16 DIAGNOSIS — G4700 Insomnia, unspecified: Secondary | ICD-10-CM

## 2023-11-16 NOTE — Telephone Encounter (Unsigned)
 Copied from CRM [id number]. Topic: Clinical - Medication Refill >> Nov 16, 2023 12:31 PM Jasmin G wrote: Medication: zolpidem  (AMBIEN ) 10 MG tablet, celecoxib  (CELEBREX ) 200 MG capsule  Has the patient contacted their pharmacy? No (Agent: If no, request that the patient contact the pharmacy for the refill. If patient does not wish to contact the pharmacy document the reason why and proceed with request.) (Agent: If yes, when and what did the pharmacy advise?)  This is the patient's preferred pharmacy:  [facility] - [place], [place] - [name] [address] Phone: [phone] Fax: [phone]  Is this the correct pharmacy for this prescription? Yes If no, delete pharmacy and type the correct one.   Has the prescription been filled recently? Yes  Is the patient out of the medication? Yes  Has the patient been seen for an appointment in the last year OR does the patient have an upcoming appointment? Yes  Can we respond through MyChart? Yes  Agent: Please be advised that Rx refills may take up to 3 business days. We ask that you follow-up with your pharmacy.

## 2023-11-16 NOTE — Telephone Encounter (Signed)
 Last written 10-17-23 #30 Last OV 10-24-23 Next OV 01-25-24 [facility]

## 2023-11-17 ENCOUNTER — Other Ambulatory Visit: Payer: Self-pay | Admitting: Oncology

## 2023-11-17 ENCOUNTER — Inpatient Hospital Stay

## 2023-11-17 DIAGNOSIS — C189 Malignant neoplasm of colon, unspecified: Secondary | ICD-10-CM

## 2023-11-17 MED ORDER — CELECOXIB 200 MG PO CAPS: 200.0000 mg | ORAL_CAPSULE | Freq: Two times a day (BID) | ORAL | 0 refills | Status: AC

## 2023-11-17 MED ORDER — ZOLPIDEM TARTRATE 10 MG PO TABS: 10.0000 mg | ORAL_TABLET | Freq: Every day | ORAL | 0 refills | Status: DC

## 2023-11-20 ENCOUNTER — Other Ambulatory Visit: Payer: Self-pay | Admitting: Oncology

## 2023-11-21 ENCOUNTER — Other Ambulatory Visit: Payer: Self-pay

## 2023-11-21 ENCOUNTER — Encounter: Payer: Self-pay | Admitting: Oncology

## 2023-11-21 NOTE — Telephone Encounter (Signed)
 He is hard to get hold of. He needs to show up for his appt tomorrow before I can refill valium 

## 2023-11-21 NOTE — Telephone Encounter (Signed)
 Forms have been received.  Waiting on Dr. Avelina to sign forms.

## 2023-11-22 ENCOUNTER — Encounter: Payer: Self-pay | Admitting: Family

## 2023-11-22 ENCOUNTER — Other Ambulatory Visit: Payer: Self-pay | Admitting: Family

## 2023-11-22 ENCOUNTER — Inpatient Hospital Stay: Admitting: Oncology

## 2023-11-22 ENCOUNTER — Inpatient Hospital Stay

## 2023-11-22 ENCOUNTER — Ambulatory Visit: Attending: Family | Admitting: Family

## 2023-11-22 VITALS — BP 135/71 | HR 97 | Wt 190.4 lb

## 2023-11-22 DIAGNOSIS — I5033 Acute on chronic diastolic (congestive) heart failure: Secondary | ICD-10-CM | POA: Insufficient documentation

## 2023-11-22 DIAGNOSIS — I5032 Chronic diastolic (congestive) heart failure: Secondary | ICD-10-CM

## 2023-11-22 DIAGNOSIS — R0789 Other chest pain: Secondary | ICD-10-CM | POA: Diagnosis not present

## 2023-11-22 DIAGNOSIS — C184 Malignant neoplasm of transverse colon: Secondary | ICD-10-CM | POA: Diagnosis not present

## 2023-11-22 DIAGNOSIS — I739 Peripheral vascular disease, unspecified: Secondary | ICD-10-CM | POA: Insufficient documentation

## 2023-11-22 DIAGNOSIS — I2510 Atherosclerotic heart disease of native coronary artery without angina pectoris: Secondary | ICD-10-CM | POA: Insufficient documentation

## 2023-11-22 DIAGNOSIS — I2089 Other forms of angina pectoris: Secondary | ICD-10-CM

## 2023-11-22 DIAGNOSIS — T501X1A Poisoning by loop [high-ceiling] diuretics, accidental (unintentional), initial encounter: Secondary | ICD-10-CM | POA: Insufficient documentation

## 2023-11-22 DIAGNOSIS — E8770 Fluid overload, unspecified: Secondary | ICD-10-CM | POA: Diagnosis not present

## 2023-11-22 DIAGNOSIS — I878 Other specified disorders of veins: Secondary | ICD-10-CM | POA: Insufficient documentation

## 2023-11-22 DIAGNOSIS — R918 Other nonspecific abnormal finding of lung field: Secondary | ICD-10-CM | POA: Insufficient documentation

## 2023-11-22 DIAGNOSIS — C19 Malignant neoplasm of rectosigmoid junction: Secondary | ICD-10-CM | POA: Diagnosis not present

## 2023-11-22 DIAGNOSIS — N2889 Other specified disorders of kidney and ureter: Secondary | ICD-10-CM | POA: Diagnosis not present

## 2023-11-22 DIAGNOSIS — Z79899 Other long term (current) drug therapy: Secondary | ICD-10-CM | POA: Insufficient documentation

## 2023-11-22 DIAGNOSIS — Z95828 Presence of other vascular implants and grafts: Secondary | ICD-10-CM | POA: Insufficient documentation

## 2023-11-22 DIAGNOSIS — Z9049 Acquired absence of other specified parts of digestive tract: Secondary | ICD-10-CM | POA: Insufficient documentation

## 2023-11-22 DIAGNOSIS — R140 Abdominal distension (gaseous): Secondary | ICD-10-CM | POA: Diagnosis not present

## 2023-11-22 NOTE — Progress Notes (Signed)
 ADVANCED HEART FAILURE FOLLOW UP CLINIC NOTE  Referring Physician: [name], MD  Primary Care: [name], MD HF Provider: Zenaida Katz, MD   Chief Complaint: shortness of breath   HPI:  Erik Martinez is a 68 y.o. male who presents for follow up of chronic diastolic heart failure.   Admitted 08/22/23 with shortness of breath and leg edema due to HF exacerbation due to severe anemia. Hg 7.3. Chest x-ray consistent with pulmonary vascular congestion. BNP elevated. Started on IV lasix  and cardiology consulted. Started on protonix  and GI consulted for anemia. Echo 08/24/23: EF 50-55%, G1DD, normal RV, mild/ moderate MR. Normal EGD 08/25/23. Colonoscopy 08/25/23 with a partially obstructing large mass in the transverse colon, likely malignant, biopsied. Surgery consulted. Chest/ abdomen/ pelvic CT 08/25/23 showing 4mm nodule, 5mm nodule right lung, colon mass.    Seen in [facility] 08/28/23 where furosemide  20mg  daily and spironolactone  25mg  daily were started.    Admitted 09/13/23 for laparoscopic right colectomy for invasive adenocarcinoma of right colon. PICC line was placed on 09/09 for TPN.   Seen in [facility] 11/07/23 fluid up where diuretic was changed to torsemide.   ROS: All systems negative except what is listed in HPI, PMH and Problem List   SUBJECTIVE:  Patient comes in as an acute visit today with a chief complaint of moderate shortness of breath. Has associated fatigue, occasional chest pain, knot over sternum when bending over or when walking a long way, pedal edema, abdominal distention (worsening). He says that his shortness of breath, fatigue and swelling are worsening. He's been taking 40mg  torsemide BID since he was last here because he didn't realize he was only supposed to do that dose for a few days and then reduce it to daily dosing. Appetite improving. Drinking 4 sodas daily along with water . He is trying to reduce his intake to 4 bottles daily.    PHYSICAL  EXAM:  General: Ill-appearing.  Cor: Elevated JVD to bottom of earlobe. Regular rhythm, rate.  Lungs: clear, respirations are unlabored Abdomen: soft, nontender, moderately distended Extremities: 1+ pitting edema bilateral lower legs Neuro:. Affect pleasant  ReDs reading: 44 %, abnormal   Vitals:   11/22/23 1502  BP: 135/71  Pulse: 97  SpO2: 98%  Weight: 190 lb 6.4 oz (86.4 kg)   Wt Readings from Last 3 Encounters:  11/22/23 190 lb 6.4 oz (86.4 kg)  11/14/23 173 lb 8 oz (78.7 kg)  11/07/23 188 lb 8 oz (85.5 kg)   Lab Results  Component Value Date   CREATININE 1.36 (H) 11/14/2023   CREATININE 0.96 11/01/2023   CREATININE 0.90 10/27/2023    ASSESSMENT & PLAN:  Acute on chronic diastolic heart failure: Presents with evidence of volume overload. Primarily abdominal distention with evidence of elevated JVP.  He also drinks an excessive amount of volume, reports that he fills his water  bottle 4-5 times a day (better than previous 8-9). - Instructed to try to cut down on fluid intake, ideally around 64 ounces - ReDs today is elevated at 44% - Will send for IV lasix  daily X next 2 days. Will get BMET/ BNP with 2nd dose of IV lasix  as he had BMET last week.  - Decrease torsemide back to 40 mg daily after the IV lasix  as renal function worsening - Continue jardiance  25mg  daily (DM dose) - Continue spironolactone  25 mg daily - weight 191.2 pounds from last visit 2 weeks ago - BNP 09/04/23 - BMET 11/14/23 reviewed: sodium 129 (  encouraged continued reduction in oral fluid intake), potassium 4.2, creatinine 1.36, GFR 57  Chest discomfort: Started around the same time as his heart failure symptoms. Review of his chest CTs shows extensive coronary artery calcifications, not surprising given his PAD history.  5-FU can cause coronary vasospasm and chest pain, but does not appear to be related to the medication. - High suspicion for cardiac angina so continue 5 mg amlodipine daily -  Functional testing with PET scan ordered - Continue atorvastatin  20 mg daily - Previously discussed coronary vasospasm symptoms with patient  PAD: - Has had prior stenting, reports some leg discomfort has persisted - Consider ABIs is symptoms persist after diuresis  Stage 2 colon cancer- - Colonoscopy 08/25/23 with a partially obstructing large mass in the transverse colon, likely malignant, biopsied. Surgery consulted.  - Chest/ abdomen/ pelvic CT 08/25/23 showing 4mm nodule left lung fissure, 5mm nodule right lung fissure favored benign, colon mass. (Per report) - Hg 11/14/23 was 9.1 - right colectomy 09/14/23 - Now plan for 6 months of 5-FU - managed at the cancer center  Return in 5 days, sooner if needed.   I spent 35 minutes reviewing records, interviewing/ examing patient and managing plan/ orders.   Ellouise Class, FNP-C 11/22/23

## 2023-11-22 NOTE — Progress Notes (Signed)
 ReDS Vest / Clip - 11/22/23 1502       ReDS Vest / Clip   Station Marker C    Ruler Value 34    ReDS Value Range High volume overload    ReDS Actual Value 44

## 2023-11-22 NOTE — Patient Instructions (Addendum)
 Medication Changes:  DECREASE Torsemide to 40mg  daily STARTING SATURDAY. Do not take Torsemide Thursday or Friday.   Follow-Up in: Please follow up with the Advanced Heart Failure Clinic on Monday with Ellouise Class, FNP.   Thank you for choosing [facility] Advanced [facility] Failure [facility].    At the Advanced Heart Failure Clinic, you and your health needs are our priority. We have a designated team specialized in the treatment of Heart Failure. This Care Team includes your primary Heart Failure Specialized Cardiologist (physician), Advanced Practice Providers (APPs- Physician Assistants and Nurse Practitioners), and Pharmacist who all work together to provide you with the care you need, when you need it.   You may see any of the following providers on your designated Care Team at your next follow up:  Dr. Toribio Fuel Dr. Ezra Shuck Dr. Ria Commander Dr. Morene Brownie Ellouise Class, FNP Jaun Bash, RPH-CPP  Please be sure to bring in all your medications bottles to every appointment.   Need to Contact Us :  If you have any questions or concerns before your next appointment please send us  a message through [place] or call our office at [phone].    TO LEAVE A MESSAGE FOR THE NURSE SELECT OPTION 2, PLEASE LEAVE A MESSAGE INCLUDING: YOUR NAME DATE OF BIRTH CALL BACK NUMBER REASON FOR CALL**this is important as we prioritize the call backs  YOU WILL RECEIVE A CALL BACK THE SAME DAY AS LONG AS YOU CALL BEFORE 4:00 PM

## 2023-11-22 NOTE — Telephone Encounter (Signed)
 Signed application faxed back to [facility] at [phone].

## 2023-11-23 ENCOUNTER — Encounter: Payer: Self-pay | Admitting: Oncology

## 2023-11-23 ENCOUNTER — Ambulatory Visit
Admission: RE | Admit: 2023-11-23 | Discharge: 2023-11-23 | Disposition: A | Discharge status: Home / Self Care | Source: Ambulatory Visit | Attending: Family | Admitting: Family

## 2023-11-23 ENCOUNTER — Other Ambulatory Visit: Payer: Self-pay | Admitting: Family

## 2023-11-23 DIAGNOSIS — I5032 Chronic diastolic (congestive) heart failure: Secondary | ICD-10-CM

## 2023-11-23 MED ORDER — FUROSEMIDE 10 MG/ML IJ SOLN
80.0000 mg | Freq: Once | INTRAMUSCULAR | Status: AC
  Administered 2023-11-23: 80 mg via INTRAVENOUS

## 2023-11-23 MED ORDER — HEPARIN SOD (PORK) LOCK FLUSH 100 UNIT/ML IV SOLN
INTRAVENOUS | Status: AC
  Filled 2023-11-23: qty 5

## 2023-11-23 MED ORDER — HEPARIN SOD (PORK) LOCK FLUSH 100 UNIT/ML IV SOLN
500.0000 [IU] | INTRAVENOUS | Status: AC | PRN
  Administered 2023-11-23: 500 [IU]

## 2023-11-23 MED ORDER — FUROSEMIDE 10 MG/ML IJ SOLN
INTRAMUSCULAR | Status: AC
Start: 2023-11-23 — End: 2023-11-23
  Filled 2023-11-23: qty 8

## 2023-11-23 NOTE — Telephone Encounter (Signed)
 Received pap novo nordisk ozempic  from provider office ,faxed to [facility] today.

## 2023-11-24 ENCOUNTER — Ambulatory Visit
Admission: RE | Admit: 2023-11-24 | Discharge: 2023-11-24 | Disposition: A | Discharge status: Home / Self Care | Source: Ambulatory Visit | Attending: Family | Admitting: Family

## 2023-11-24 ENCOUNTER — Ambulatory Visit: Payer: Self-pay | Admitting: Family

## 2023-11-24 ENCOUNTER — Encounter: Admitting: Family

## 2023-11-24 ENCOUNTER — Inpatient Hospital Stay

## 2023-11-24 ENCOUNTER — Telehealth: Payer: Self-pay | Admitting: Family

## 2023-11-24 ENCOUNTER — Telehealth: Payer: Self-pay

## 2023-11-24 DIAGNOSIS — I5032 Chronic diastolic (congestive) heart failure: Secondary | ICD-10-CM | POA: Insufficient documentation

## 2023-11-24 LAB — PRO BRAIN NATRIURETIC PEPTIDE: Pro Brain Natriuretic Peptide: 5075 pg/mL — ABNORMAL HIGH (ref <300.0)

## 2023-11-24 LAB — BASIC METABOLIC PANEL WITH GFR
Anion gap: 12 (ref 5–15)
BUN: 12 mg/dL (ref 8–23)
CO2: 29 mmol/L (ref 22–32)
Calcium: 8.8 mg/dL — ABNORMAL LOW (ref 8.9–10.3)
Chloride: 92 mmol/L — ABNORMAL LOW (ref 98–111)
Creatinine, Ser: 1.01 mg/dL (ref 0.61–1.24)
GFR, Estimated: >60 mL/min (ref >60)
Glucose, Bld: 289 mg/dL — ABNORMAL HIGH (ref 70–99)
Potassium: 3.2 mmol/L — ABNORMAL LOW (ref 3.5–5.1)
Sodium: 133 mmol/L — ABNORMAL LOW (ref 135–145)

## 2023-11-24 MED ORDER — HEPARIN SOD (PORK) LOCK FLUSH 100 UNIT/ML IV SOLN
500.0000 [IU] | INTRAVENOUS | Status: AC | PRN
  Administered 2023-11-24: 500 [IU]

## 2023-11-24 MED ORDER — FUROSEMIDE 10 MG/ML IJ SOLN
80.0000 mg | Freq: Once | INTRAMUSCULAR | Status: AC
  Administered 2023-11-24: 80 mg via INTRAVENOUS

## 2023-11-24 MED ORDER — FUROSEMIDE 10 MG/ML IJ SOLN
INTRAMUSCULAR | Status: AC
  Filled 2023-11-24: qty 8

## 2023-11-24 MED ORDER — POTASSIUM CHLORIDE CRYS ER 20 MEQ PO TBCR
EXTENDED_RELEASE_TABLET | ORAL | Status: AC
  Filled 2023-11-24: qty 2

## 2023-11-24 MED ORDER — HEPARIN SOD (PORK) LOCK FLUSH 100 UNIT/ML IV SOLN
INTRAVENOUS | Status: AC
  Filled 2023-11-24: qty 5

## 2023-11-24 MED ORDER — POTASSIUM CHLORIDE CRYS ER 20 MEQ PO TBCR
40.0000 meq | EXTENDED_RELEASE_TABLET | Freq: Once | ORAL | Status: AC
  Administered 2023-11-24: 40 meq via ORAL

## 2023-11-24 NOTE — Telephone Encounter (Signed)
 Received call from lab stating that the pt's bnp lab was incorrectly done and is now being re-run. Provider notified.

## 2023-11-24 NOTE — Telephone Encounter (Signed)
 Called to confirm/remind patient of their appointment at the Advanced Heart Failure Clinic on 11/27/23.   Appointment:   [x] Confirmed  [] Left mess   [] No answer/No voice mail  [] VM Full/unable to leave message  [] Phone not in service  Patient reminded to bring all medications and/or complete list.  Confirmed patient has transportation. Gave directions, instructed to utilize valet parking.

## 2023-11-26 NOTE — Progress Notes (Unsigned)
 ADVANCED HEART FAILURE FOLLOW UP CLINIC NOTE  Referring Physician: [name], MD  Primary Care: [name], MD HF Provider: Zenaida Katz, MD   Chief Complaint: shortness of breath   HPI:  Erik Martinez is a 68 y.o. male who presents for follow up of chronic diastolic heart failure.   Admitted 08/22/23 with shortness of breath and leg edema due to HF exacerbation due to severe anemia. Hg 7.3. Chest x-ray consistent with pulmonary vascular congestion. BNP elevated. Started on IV lasix  and cardiology consulted. Started on protonix  and GI consulted for anemia. Echo 08/24/23: EF 50-55%, G1DD, normal RV, mild/ moderate MR. Normal EGD 08/25/23. Colonoscopy 08/25/23 with a partially obstructing large mass in the transverse colon, likely malignant, biopsied. Surgery consulted. Chest/ abdomen/ pelvic CT 08/25/23 showing 4mm nodule, 5mm nodule right lung, colon mass.    Seen in [facility] 08/28/23 where furosemide  20mg  daily and spironolactone  25mg  daily were started.    Admitted 09/13/23 for laparoscopic right colectomy for invasive adenocarcinoma of right colon. PICC line was placed on 09/09 for TPN.   Seen in [facility] 11/07/23 fluid up where diuretic was changed to torsemide.   ROS: All systems negative except what is listed in HPI, PMH and Problem List   SUBJECTIVE:  Patient comes in as an acute visit today with a chief complaint of moderate shortness of breath. Has associated fatigue, occasional chest pain, knot over sternum when bending over or when walking a long way, pedal edema, abdominal distention (worsening). He says that his shortness of breath, fatigue and swelling are worsening. He's been taking 40mg  torsemide BID since he was last here because he didn't realize he was only supposed to do that dose for a few days and then reduce it to daily dosing. Appetite improving. Drinking 4 sodas daily along with water . He is trying to reduce his intake to 4 bottles daily.    PHYSICAL  EXAM:  General: Ill-appearing.  Cor: Elevated JVD to bottom of earlobe. Regular rhythm, rate.  Lungs: clear, respirations are unlabored Abdomen: soft, nontender, moderately distended Extremities: 1+ pitting edema bilateral lower legs Neuro:. Affect pleasant  ReDs reading: 44 %, abnormal   There were no vitals filed for this visit.  Wt Readings from Last 3 Encounters:  11/22/23 190 lb 6.4 oz (86.4 kg)  11/14/23 173 lb 8 oz (78.7 kg)  11/07/23 188 lb 8 oz (85.5 kg)   Lab Results  Component Value Date   CREATININE 1.01 11/24/2023   CREATININE 1.36 (H) 11/14/2023   CREATININE 0.96 11/01/2023    ASSESSMENT & PLAN:  Acute on chronic diastolic heart failure: Presents with evidence of volume overload. Primarily abdominal distention with evidence of elevated JVP.  He also drinks an excessive amount of volume, reports that he fills his water  bottle 4-5 times a day (better than previous 8-9). - Instructed to try to cut down on fluid intake, ideally around 64 ounces - ReDs today is elevated at 44% - Will send for IV lasix  daily X next 2 days. Will get BMET/ BNP with 2nd dose of IV lasix  as he had BMET last week.  - Decrease torsemide back to 40 mg daily after the IV lasix  as renal function worsening - Continue jardiance  25mg  daily (DM dose) - Continue spironolactone  25 mg daily - weight 191.2 pounds from last visit 2 weeks ago - BNP 09/04/23 - BMET 11/14/23 reviewed: sodium 129 (encouraged continued reduction in oral fluid intake), potassium 4.2, creatinine 1.36, GFR 57  Chest  discomfort: Started around the same time as his heart failure symptoms. Review of his chest CTs shows extensive coronary artery calcifications, not surprising given his PAD history.  5-FU can cause coronary vasospasm and chest pain, but does not appear to be related to the medication. - High suspicion for cardiac angina so continue 5 mg amlodipine daily - Functional testing with PET scan ordered - Continue  atorvastatin  20 mg daily - Previously discussed coronary vasospasm symptoms with patient  PAD: - Has had prior stenting, reports some leg discomfort has persisted - Consider ABIs is symptoms persist after diuresis  Stage 2 colon cancer- - Colonoscopy 08/25/23 with a partially obstructing large mass in the transverse colon, likely malignant, biopsied. Surgery consulted.  - Chest/ abdomen/ pelvic CT 08/25/23 showing 4mm nodule left lung fissure, 5mm nodule right lung fissure favored benign, colon mass. (Per report) - Hg 11/14/23 was 9.1 - right colectomy 09/14/23 - Now plan for 6 months of 5-FU - managed at the cancer center  Return in 5 days, sooner if needed.   I spent 35 minutes reviewing records, interviewing/ examing patient and managing plan/ orders.   Ellouise Class, FNP-C 11/26/23

## 2023-11-27 ENCOUNTER — Other Ambulatory Visit
Admission: RE | Admit: 2023-11-27 | Discharge: 2023-11-27 | Disposition: A | Discharge status: Home / Self Care | Source: Ambulatory Visit | Attending: Family | Admitting: Family

## 2023-11-27 ENCOUNTER — Encounter: Payer: Self-pay | Admitting: Family

## 2023-11-27 ENCOUNTER — Ambulatory Visit (HOSPITAL_BASED_OUTPATIENT_CLINIC_OR_DEPARTMENT_OTHER): Admitting: Family

## 2023-11-27 ENCOUNTER — Ambulatory Visit: Payer: Self-pay | Admitting: Family

## 2023-11-27 VITALS — BP 134/67 | HR 86 | Wt 184.1 lb

## 2023-11-27 DIAGNOSIS — I739 Peripheral vascular disease, unspecified: Secondary | ICD-10-CM | POA: Diagnosis not present

## 2023-11-27 DIAGNOSIS — R918 Other nonspecific abnormal finding of lung field: Secondary | ICD-10-CM | POA: Diagnosis not present

## 2023-11-27 DIAGNOSIS — Z79899 Other long term (current) drug therapy: Secondary | ICD-10-CM | POA: Insufficient documentation

## 2023-11-27 DIAGNOSIS — E119 Type 2 diabetes mellitus without complications: Secondary | ICD-10-CM | POA: Diagnosis not present

## 2023-11-27 DIAGNOSIS — R5383 Other fatigue: Secondary | ICD-10-CM | POA: Diagnosis not present

## 2023-11-27 DIAGNOSIS — R600 Localized edema: Secondary | ICD-10-CM | POA: Insufficient documentation

## 2023-11-27 DIAGNOSIS — I2510 Atherosclerotic heart disease of native coronary artery without angina pectoris: Secondary | ICD-10-CM | POA: Insufficient documentation

## 2023-11-27 DIAGNOSIS — I2089 Other forms of angina pectoris: Secondary | ICD-10-CM

## 2023-11-27 DIAGNOSIS — C19 Malignant neoplasm of rectosigmoid junction: Secondary | ICD-10-CM

## 2023-11-27 DIAGNOSIS — R0602 Shortness of breath: Secondary | ICD-10-CM | POA: Insufficient documentation

## 2023-11-27 DIAGNOSIS — I5032 Chronic diastolic (congestive) heart failure: Secondary | ICD-10-CM | POA: Insufficient documentation

## 2023-11-27 DIAGNOSIS — C189 Malignant neoplasm of colon, unspecified: Secondary | ICD-10-CM | POA: Insufficient documentation

## 2023-11-27 LAB — PRO BRAIN NATRIURETIC PEPTIDE: Pro Brain Natriuretic Peptide: 2433 pg/mL — ABNORMAL HIGH (ref <300.0)

## 2023-11-27 LAB — BASIC METABOLIC PANEL WITH GFR
Anion gap: 8 (ref 5–15)
BUN: 15 mg/dL (ref 8–23)
CO2: 27 mmol/L (ref 22–32)
Calcium: 8.8 mg/dL — ABNORMAL LOW (ref 8.9–10.3)
Chloride: 95 mmol/L — ABNORMAL LOW (ref 98–111)
Creatinine, Ser: 0.88 mg/dL (ref 0.61–1.24)
GFR, Estimated: >60 mL/min (ref >60)
Glucose, Bld: 334 mg/dL — ABNORMAL HIGH (ref 70–99)
Potassium: 3.7 mmol/L (ref 3.5–5.1)
Sodium: 131 mmol/L — ABNORMAL LOW (ref 135–145)

## 2023-11-27 MED ORDER — TORSEMIDE 20 MG PO TABS: 60.0000 mg | ORAL_TABLET | Freq: Every day | ORAL | 5 refills | Status: DC

## 2023-11-27 MED ORDER — POTASSIUM CHLORIDE CRYS ER 20 MEQ PO TBCR: 20.0000 meq | EXTENDED_RELEASE_TABLET | Freq: Every day | ORAL | 3 refills | Status: AC

## 2023-11-27 NOTE — Patient Instructions (Signed)
 Medication Changes:  INCREASE Torsemide to 60mg  (3 tabs) daily.  START Potassium 20meq (1 tab) daily  Lab Work:  Go over to the [facility]. Go pass the gift shop and have your blood work completed.   We will only call you if the results are abnormal or if the provider would like to make medication changes.  No news is good news.   Follow-Up in: Please follow up with the Advanced Heart Failure Clinic in 1 month with Ellouise Class, FNP   Thank you for choosing [facility] Advanced [facility] Failure [facility].    At the Advanced Heart Failure Clinic, you and your health needs are our priority. We have a designated team specialized in the treatment of Heart Failure. This Care Team includes your primary Heart Failure Specialized Cardiologist (physician), Advanced Practice Providers (APPs- Physician Assistants and Nurse Practitioners), and Pharmacist who all work together to provide you with the care you need, when you need it.   You may see any of the following providers on your designated Care Team at your next follow up:  Dr. Toribio Fuel Dr. Ezra Shuck Dr. Ria Commander Dr. Morene Brownie Ellouise Class, FNP Jaun Bash, RPH-CPP  Please be sure to bring in all your medications bottles to every appointment.   Need to Contact Us :  If you have any questions or concerns before your next appointment please send us  a message through [place] or call our office at [phone].    TO LEAVE A MESSAGE FOR THE NURSE SELECT OPTION 2, PLEASE LEAVE A MESSAGE INCLUDING: YOUR NAME DATE OF BIRTH CALL BACK NUMBER REASON FOR CALL**this is important as we prioritize the call backs  YOU WILL RECEIVE A CALL BACK THE SAME DAY AS LONG AS YOU CALL BEFORE 4:00 PM

## 2023-11-27 NOTE — Progress Notes (Signed)
 ReDS Vest / Clip - 11/27/23 1046       ReDS Vest / Clip   Station Marker C    Ruler Value 28    ReDS Value Range High volume overload    ReDS Actual Value 41

## 2023-11-28 DIAGNOSIS — Z79891 Long term (current) use of opiate analgesic: Secondary | ICD-10-CM | POA: Diagnosis not present

## 2023-11-28 DIAGNOSIS — G894 Chronic pain syndrome: Secondary | ICD-10-CM | POA: Diagnosis not present

## 2023-11-28 DIAGNOSIS — M545 Low back pain, unspecified: Secondary | ICD-10-CM | POA: Diagnosis not present

## 2023-11-28 DIAGNOSIS — M25512 Pain in left shoulder: Secondary | ICD-10-CM | POA: Diagnosis not present

## 2023-11-28 DIAGNOSIS — M961 Postlaminectomy syndrome, not elsewhere classified: Secondary | ICD-10-CM | POA: Diagnosis not present

## 2023-11-28 DIAGNOSIS — G890 Central pain syndrome: Secondary | ICD-10-CM | POA: Diagnosis not present

## 2023-11-28 LAB — DPYD GENOTYPING: DPYD Metabolic Activity: NORMAL

## 2023-11-29 ENCOUNTER — Telehealth: Payer: Self-pay | Admitting: Oncology

## 2023-11-29 ENCOUNTER — Telehealth: Payer: Self-pay

## 2023-11-29 ENCOUNTER — Other Ambulatory Visit: Payer: Self-pay | Admitting: Oncology

## 2023-11-29 NOTE — Telephone Encounter (Signed)
 Left message for Mr. Honaker to return call to office.  If patient calls back please schedule an office visit with Dr. Avelina to discuss pain management.    Dr. Avelina.  I have added the fentanyl  patches to Mr. Robitaille medication list.  I have left a message for Mr. Sherod to return call to office.  I am never able to reach patient by phone due to he keeps his ringer off so I just ask that if he calls back for them to schedule a pain management appointment with you so this can be discussed further in person.

## 2023-11-29 NOTE — Telephone Encounter (Signed)
 Patient called to say his mouth sores are under control and he wants to know when he will start treatment back. He would like a call please.

## 2023-11-29 NOTE — Telephone Encounter (Signed)
 Copied from CRM [id number]. Topic: Clinical - Medication Question >> Nov 29, 2023 10:50 AM Vena HERO wrote: Reason for CRM: Pt called in today because he is having some issues and would like to discuss with Dr Avelina. His pain mgmt clinic recently started charging him $200 per appt and is causing hardship financially for him. He states that he called his insurance company and they stated the clinic should be writing it off and not charging him. He would like to know if Dr Avelina would consider taking over his prescriptions for oxycodone  and fentanyl  patches or if she has some other advise or suggestion to please call him (ok to lvm). He is concerned about trying to find new pain mgmt and having to start all over because he has been managing very well but he cannot afford to pay for the medications AND the appts every time.

## 2023-11-29 NOTE — Telephone Encounter (Signed)
 Patient called to say that he needs a refill on Diazepam . He states Dr. Melanee said she would refill it as long as he is taking Chemo- He has asked for a reschedule of appointments due to mouth sores clearing up. Please reschedule appointments and return his call.

## 2023-11-29 NOTE — Telephone Encounter (Signed)
 Patient called requesting refill for Lorazepam with additional refills sent to [facility].  There is a refill request sent by pharmacy today and on 11/21/23.  Dr. Melanee commented on 11/21/23 refill request that patient needs come for follow up appt prior to Lorazepam refill.  Left message informing that Dr. Melanee does have refill requests but he will have follow up prior to refill.

## 2023-11-29 NOTE — Addendum Note (Signed)
 Addended by: WENDELL ARLAND RAMAN on: 11/29/2023 02:26 PM   Modules accepted: Orders

## 2023-11-29 NOTE — Telephone Encounter (Signed)
 Left message for pt requesting he call the office to schedule appt.

## 2023-11-29 NOTE — Telephone Encounter (Signed)
 I am unable to refill diazepam  unless he actually shows up for his next appointment.  I have asked Duwaine to call him and see when he can come after Thanksgiving

## 2023-11-29 NOTE — Telephone Encounter (Signed)
 Denial rx sent to pharmacy.  I also called the pharmacy to let them know that pt needs f/u prior Dr. Melanee filling Diazepam .

## 2023-11-29 NOTE — Telephone Encounter (Signed)
 Megan please ask him when he wants to come for his appointment after Thanksgiving and we can reschedule accordingly

## 2023-11-29 NOTE — Telephone Encounter (Signed)
 Please update his med list as I do not see fentanyl  patches on there. Can you verify what medications he is taking for pain? I typically do not prescribe long-term fentanyl  patches but would be willing to do this as long as his dose did not need to change/as long as he remains stable and if it is not high dose.  If dosing  high or needs to change then he should be referred to a pain center.  We would need to do Q 2-month office visits with pain contract and urine drug screen as usual.  I will review again once medication list is updated.

## 2023-11-30 ENCOUNTER — Other Ambulatory Visit: Payer: Self-pay | Admitting: Oncology

## 2023-11-30 NOTE — Telephone Encounter (Signed)
 Per Dr. Melanee she cannot refill diazepam  until you come for your next follow up appointment currently scheduled 12/15/23.  Outbound call to patient; voice message left indicating refill request cannot be processed until he comes for his next follow up appointment.

## 2023-11-30 NOTE — Telephone Encounter (Signed)
 Which day am I moving his chemotherapy to?

## 2023-11-30 NOTE — Telephone Encounter (Signed)
 done

## 2023-11-30 NOTE — Telephone Encounter (Signed)
 Patient called again for Diazepam  refill.  Do you want to send now or during/after appt?

## 2023-12-01 ENCOUNTER — Telehealth: Payer: Self-pay

## 2023-12-01 NOTE — Telephone Encounter (Signed)
 Left message on VM for pt that his 4 Boxes of Ozempic  have arrived. Place in 2nd fridge in med area.

## 2023-12-09 ENCOUNTER — Other Ambulatory Visit: Payer: Self-pay

## 2023-12-09 ENCOUNTER — Inpatient Hospital Stay
Admission: EM | Admit: 2023-12-09 | Discharge: 2023-12-12 | DRG: 291 | Disposition: A | Attending: Family Medicine | Admitting: Family Medicine

## 2023-12-09 ENCOUNTER — Emergency Department

## 2023-12-09 DIAGNOSIS — Z794 Long term (current) use of insulin: Secondary | ICD-10-CM | POA: Diagnosis not present

## 2023-12-09 DIAGNOSIS — E1151 Type 2 diabetes mellitus with diabetic peripheral angiopathy without gangrene: Secondary | ICD-10-CM | POA: Diagnosis not present

## 2023-12-09 DIAGNOSIS — Z79899 Other long term (current) drug therapy: Secondary | ICD-10-CM | POA: Diagnosis not present

## 2023-12-09 DIAGNOSIS — K219 Gastro-esophageal reflux disease without esophagitis: Secondary | ICD-10-CM

## 2023-12-09 DIAGNOSIS — E785 Hyperlipidemia, unspecified: Secondary | ICD-10-CM | POA: Diagnosis not present

## 2023-12-09 DIAGNOSIS — N179 Acute kidney failure, unspecified: Secondary | ICD-10-CM | POA: Diagnosis not present

## 2023-12-09 DIAGNOSIS — N319 Neuromuscular dysfunction of bladder, unspecified: Secondary | ICD-10-CM | POA: Diagnosis not present

## 2023-12-09 DIAGNOSIS — E876 Hypokalemia: Secondary | ICD-10-CM | POA: Diagnosis not present

## 2023-12-09 DIAGNOSIS — I509 Heart failure, unspecified: Secondary | ICD-10-CM | POA: Diagnosis not present

## 2023-12-09 DIAGNOSIS — I110 Hypertensive heart disease with heart failure: Secondary | ICD-10-CM | POA: Diagnosis not present

## 2023-12-09 DIAGNOSIS — Z885 Allergy status to narcotic agent status: Secondary | ICD-10-CM | POA: Diagnosis not present

## 2023-12-09 DIAGNOSIS — G894 Chronic pain syndrome: Secondary | ICD-10-CM | POA: Diagnosis not present

## 2023-12-09 DIAGNOSIS — Z7984 Long term (current) use of oral hypoglycemic drugs: Secondary | ICD-10-CM | POA: Diagnosis not present

## 2023-12-09 DIAGNOSIS — Z7985 Long-term (current) use of injectable non-insulin antidiabetic drugs: Secondary | ICD-10-CM | POA: Diagnosis not present

## 2023-12-09 DIAGNOSIS — I10 Essential (primary) hypertension: Secondary | ICD-10-CM | POA: Diagnosis not present

## 2023-12-09 DIAGNOSIS — J811 Chronic pulmonary edema: Secondary | ICD-10-CM | POA: Diagnosis not present

## 2023-12-09 DIAGNOSIS — D6481 Anemia due to antineoplastic chemotherapy: Secondary | ICD-10-CM | POA: Diagnosis not present

## 2023-12-09 DIAGNOSIS — Z833 Family history of diabetes mellitus: Secondary | ICD-10-CM | POA: Diagnosis not present

## 2023-12-09 DIAGNOSIS — R0602 Shortness of breath: Secondary | ICD-10-CM | POA: Diagnosis not present

## 2023-12-09 DIAGNOSIS — C189 Malignant neoplasm of colon, unspecified: Secondary | ICD-10-CM | POA: Diagnosis not present

## 2023-12-09 DIAGNOSIS — F319 Bipolar disorder, unspecified: Secondary | ICD-10-CM | POA: Diagnosis not present

## 2023-12-09 DIAGNOSIS — K589 Irritable bowel syndrome without diarrhea: Secondary | ICD-10-CM | POA: Insufficient documentation

## 2023-12-09 DIAGNOSIS — M7989 Other specified soft tissue disorders: Secondary | ICD-10-CM

## 2023-12-09 DIAGNOSIS — I517 Cardiomegaly: Secondary | ICD-10-CM | POA: Diagnosis not present

## 2023-12-09 DIAGNOSIS — D630 Anemia in neoplastic disease: Secondary | ICD-10-CM | POA: Diagnosis not present

## 2023-12-09 DIAGNOSIS — I5033 Acute on chronic diastolic (congestive) heart failure: Secondary | ICD-10-CM | POA: Diagnosis present

## 2023-12-09 DIAGNOSIS — I5031 Acute diastolic (congestive) heart failure: Secondary | ICD-10-CM | POA: Diagnosis not present

## 2023-12-09 DIAGNOSIS — R609 Edema, unspecified: Secondary | ICD-10-CM | POA: Diagnosis not present

## 2023-12-09 DIAGNOSIS — J90 Pleural effusion, not elsewhere classified: Secondary | ICD-10-CM | POA: Diagnosis not present

## 2023-12-09 DIAGNOSIS — Z96642 Presence of left artificial hip joint: Secondary | ICD-10-CM | POA: Diagnosis not present

## 2023-12-09 DIAGNOSIS — D509 Iron deficiency anemia, unspecified: Secondary | ICD-10-CM | POA: Diagnosis not present

## 2023-12-09 DIAGNOSIS — I2510 Atherosclerotic heart disease of native coronary artery without angina pectoris: Secondary | ICD-10-CM | POA: Diagnosis not present

## 2023-12-09 DIAGNOSIS — E871 Hypo-osmolality and hyponatremia: Secondary | ICD-10-CM | POA: Diagnosis not present

## 2023-12-09 DIAGNOSIS — T451X5A Adverse effect of antineoplastic and immunosuppressive drugs, initial encounter: Secondary | ICD-10-CM | POA: Diagnosis not present

## 2023-12-09 LAB — CBC
HCT: 28.3 % — ABNORMAL LOW (ref 39.0–52.0)
Hemoglobin: 8.4 g/dL — ABNORMAL LOW (ref 13.0–17.0)
MCH: 20.3 pg — ABNORMAL LOW (ref 26.0–34.0)
MCHC: 29.7 g/dL — ABNORMAL LOW (ref 30.0–36.0)
MCV: 68.5 fL — ABNORMAL LOW (ref 80.0–100.0)
Platelets: 297 K/uL (ref 150–400)
RBC: 4.13 MIL/uL — ABNORMAL LOW (ref 4.22–5.81)
RDW: 19.7 % — ABNORMAL HIGH (ref 11.5–15.5)
WBC: 9.9 K/uL (ref 4.0–10.5)
nRBC: 0 % (ref 0.0–0.2)

## 2023-12-09 LAB — BASIC METABOLIC PANEL WITH GFR
Anion gap: 11 (ref 5–15)
BUN: 11 mg/dL (ref 8–23)
CO2: 25 mmol/L (ref 22–32)
Calcium: 8.8 mg/dL — ABNORMAL LOW (ref 8.9–10.3)
Chloride: 98 mmol/L (ref 98–111)
Creatinine, Ser: 0.88 mg/dL (ref 0.61–1.24)
GFR, Estimated: >60 mL/min (ref >60)
Glucose, Bld: 141 mg/dL — ABNORMAL HIGH (ref 70–99)
Potassium: 3.9 mmol/L (ref 3.5–5.1)
Sodium: 134 mmol/L — ABNORMAL LOW (ref 135–145)

## 2023-12-09 LAB — PRO BRAIN NATRIURETIC PEPTIDE: Pro Brain Natriuretic Peptide: 10855 pg/mL — ABNORMAL HIGH (ref <300.0)

## 2023-12-09 LAB — TROPONIN T, HIGH SENSITIVITY: Troponin T High Sensitivity: 27 ng/L — ABNORMAL HIGH (ref 0–19)

## 2023-12-09 LAB — PROTIME-INR
INR: 1.3 — ABNORMAL HIGH (ref 0.8–1.2)
Prothrombin Time: 16.5 s — ABNORMAL HIGH (ref 11.4–15.2)

## 2023-12-09 MED ORDER — ACETAMINOPHEN 650 MG RE SUPP: 650.0000 mg | Freq: Four times a day (QID) | RECTAL | Status: DC | PRN

## 2023-12-09 MED ORDER — MAGIC MOUTHWASH W/LIDOCAINE: 5.0000 mL | Freq: Four times a day (QID) | ORAL | Status: DC | PRN

## 2023-12-09 MED ORDER — POLYETHYLENE GLYCOL 3350 17 G PO PACK: 17.0000 g | PACK | Freq: Every day | ORAL | Status: DC | PRN

## 2023-12-09 MED ORDER — FUROSEMIDE 10 MG/ML IJ SOLN
120.0000 mg | Freq: Once | INTRAVENOUS | Status: AC
  Administered 2023-12-09: 120 mg via INTRAVENOUS
  Filled 2023-12-09: qty 10

## 2023-12-09 MED ORDER — TRAZODONE HCL 50 MG PO TABS
25.0000 mg | ORAL_TABLET | Freq: Every evening | ORAL | Status: DC | PRN
  Administered 2023-12-10: 25 mg via ORAL
  Filled 2023-12-09: qty 1

## 2023-12-09 MED ORDER — OXYCODONE HCL 5 MG PO TABS
10.0000 mg | ORAL_TABLET | Freq: Once | ORAL | Status: AC
Start: 2023-12-09 — End: 2023-12-09
  Administered 2023-12-09: 10 mg via ORAL
  Filled 2023-12-09: qty 2

## 2023-12-09 MED ORDER — ONDANSETRON HCL 4 MG PO TABS: 4.0000 mg | ORAL_TABLET | Freq: Four times a day (QID) | ORAL | Status: DC | PRN

## 2023-12-09 MED ORDER — OXYCODONE HCL 5 MG PO TABS
20.0000 mg | ORAL_TABLET | ORAL | Status: DC | PRN
  Administered 2023-12-10 – 2023-12-12 (×13): 20 mg via ORAL
  Filled 2023-12-09 (×13): qty 4

## 2023-12-09 MED ORDER — LIDOCAINE 5 % EX PTCH
3.0000 | MEDICATED_PATCH | CUTANEOUS | Status: DC
  Filled 2023-12-09 (×3): qty 3

## 2023-12-09 MED ORDER — PREGABALIN 75 MG PO CAPS
200.0000 mg | ORAL_CAPSULE | Freq: Three times a day (TID) | ORAL | Status: DC
  Administered 2023-12-10 – 2023-12-12 (×7): 200 mg via ORAL
  Filled 2023-12-09 (×5): qty 1
  Filled 2023-12-09: qty 4
  Filled 2023-12-09: qty 1

## 2023-12-09 MED ORDER — FUROSEMIDE 10 MG/ML IJ SOLN
60.0000 mg | Freq: Two times a day (BID) | INTRAMUSCULAR | Status: DC
  Administered 2023-12-10 – 2023-12-11 (×3): 60 mg via INTRAVENOUS
  Filled 2023-12-09 (×2): qty 6
  Filled 2023-12-09: qty 8

## 2023-12-09 MED ORDER — ENOXAPARIN SODIUM 40 MG/0.4ML IJ SOSY
40.0000 mg | PREFILLED_SYRINGE | INTRAMUSCULAR | Status: DC
  Administered 2023-12-10: 40 mg via SUBCUTANEOUS
  Filled 2023-12-09: qty 0.4

## 2023-12-09 MED ORDER — EMPAGLIFLOZIN 25 MG PO TABS
25.0000 mg | ORAL_TABLET | Freq: Every day | ORAL | Status: DC
  Administered 2023-12-10 – 2023-12-12 (×3): 25 mg via ORAL
  Filled 2023-12-09 (×3): qty 1

## 2023-12-09 MED ORDER — LUBIPROSTONE 8 MCG PO CAPS
8.0000 ug | ORAL_CAPSULE | Freq: Three times a day (TID) | ORAL | Status: DC
  Administered 2023-12-10 – 2023-12-12 (×7): 8 ug via ORAL
  Filled 2023-12-09 (×9): qty 1

## 2023-12-09 MED ORDER — ACETAMINOPHEN 325 MG PO TABS
650.0000 mg | ORAL_TABLET | Freq: Four times a day (QID) | ORAL | Status: DC | PRN
  Filled 2023-12-09: qty 2

## 2023-12-09 MED ORDER — SPIRONOLACTONE 25 MG PO TABS
25.0000 mg | ORAL_TABLET | Freq: Every day | ORAL | Status: DC
  Administered 2023-12-10 – 2023-12-12 (×3): 25 mg via ORAL
  Filled 2023-12-09 (×3): qty 1

## 2023-12-09 MED ORDER — FENTANYL 25 MCG/HR TD PT72
1.0000 | MEDICATED_PATCH | TRANSDERMAL | Status: DC
  Administered 2023-12-10: 1 via TRANSDERMAL
  Filled 2023-12-09: qty 1

## 2023-12-09 MED ORDER — PANTOPRAZOLE SODIUM 40 MG PO TBEC
40.0000 mg | DELAYED_RELEASE_TABLET | Freq: Every day | ORAL | Status: DC
  Administered 2023-12-10 – 2023-12-12 (×3): 40 mg via ORAL
  Filled 2023-12-09 (×3): qty 1

## 2023-12-09 MED ORDER — AMLODIPINE BESYLATE 5 MG PO TABS
5.0000 mg | ORAL_TABLET | Freq: Every day | ORAL | Status: DC
  Administered 2023-12-10 – 2023-12-12 (×3): 5 mg via ORAL
  Filled 2023-12-09 (×3): qty 1

## 2023-12-09 MED ORDER — POTASSIUM CHLORIDE CRYS ER 20 MEQ PO TBCR
20.0000 meq | EXTENDED_RELEASE_TABLET | Freq: Every day | ORAL | Status: DC
  Filled 2023-12-09: qty 1

## 2023-12-09 MED ORDER — CYCLOBENZAPRINE HCL 10 MG PO TABS
10.0000 mg | ORAL_TABLET | Freq: Three times a day (TID) | ORAL | Status: DC
  Administered 2023-12-10 (×2): 10 mg via ORAL
  Filled 2023-12-09 (×2): qty 1

## 2023-12-09 MED ORDER — ZOLPIDEM TARTRATE 5 MG PO TABS
10.0000 mg | ORAL_TABLET | Freq: Every day | ORAL | Status: DC
  Administered 2023-12-10 – 2023-12-11 (×3): 10 mg via ORAL
  Filled 2023-12-09 (×3): qty 2

## 2023-12-09 MED ORDER — ATORVASTATIN CALCIUM 20 MG PO TABS
20.0000 mg | ORAL_TABLET | Freq: Every day | ORAL | Status: DC
  Administered 2023-12-10 – 2023-12-11 (×2): 20 mg via ORAL
  Filled 2023-12-09 (×2): qty 1

## 2023-12-09 MED ORDER — POLYSACCHARIDE IRON COMPLEX 150 MG PO CAPS
150.0000 mg | ORAL_CAPSULE | Freq: Every day | ORAL | Status: DC
  Administered 2023-12-10 – 2023-12-12 (×3): 150 mg via ORAL
  Filled 2023-12-09 (×3): qty 1

## 2023-12-09 MED ORDER — ONDANSETRON HCL 4 MG/2ML IJ SOLN: 4.0000 mg | Freq: Four times a day (QID) | INTRAMUSCULAR | Status: DC | PRN

## 2023-12-09 MED ORDER — PROCHLORPERAZINE MALEATE 10 MG PO TABS: 10.0000 mg | ORAL_TABLET | Freq: Four times a day (QID) | ORAL | Status: DC | PRN

## 2023-12-09 MED ORDER — DIAZEPAM 5 MG PO TABS
5.0000 mg | ORAL_TABLET | Freq: Every day | ORAL | Status: DC
  Administered 2023-12-10 – 2023-12-11 (×3): 5 mg via ORAL
  Filled 2023-12-09 (×3): qty 1

## 2023-12-09 NOTE — ED Notes (Signed)
 Pt has port in chest for blood access.

## 2023-12-09 NOTE — ED Provider Notes (Signed)
 Erik Martinez Provider Note    Event Date/Time   First MD Initiated Contact with Patient 12/09/23 1906     (approximate)   History   Shortness of Breath   HPI  Erik Martinez is a 68 y.o. male with history of CHF on torsemide , diabetes, CAD, GAD, chronic pain, colon cancer with mets on chemo, presenting with shortness of breath and leg swelling.  States that he has been more swollen recently, denies any chest pain, no cough or fever.  States that he got switch out from his Lasix  to another medications but that does not seem to be helping with reducing the swelling.  No fever.  Per independent history from EMS, he is A and O x 4, was having 2 days of shortness of breath especially with exertion, noted worsening lower extremity edema, was satting 94% for them on room air.  On independent chart review, he has grade 1 diastolic dysfunction, EF of 50 to 55%.  Is on torsemide  60 mg daily.     Physical Exam   Triage Vital Signs: ED Triage Vitals [12/09/23 1845]  Encounter Vitals Group     BP 132/75     Girls Systolic BP Percentile      Girls Diastolic BP Percentile      Boys Systolic BP Percentile      Boys Diastolic BP Percentile      Pulse Rate (!) 102     Resp 20     Temp 98.7 F (37.1 C)     Temp Source Oral     SpO2 100 %     Weight      Height 5' 9 (1.753 m)     Head Circumference      Peak Flow      Pain Score 0     Pain Loc      Pain Education      Exclude from Growth Chart     Most recent vital signs: Vitals:   12/09/23 2042 12/09/23 2230  BP:  (!) 140/75  Pulse:  96  Resp:  (!) 21  Temp:  98.4 F (36.9 C)  SpO2: 100% 100%     General: Awake, no distress.  CV:  Good peripheral perfusion.  Resp:  Normal effort.  Crackles in the bases Abd:  No distention.  Soft nontender Other:  Bilateral lower extremity edema that appears symmetrical   ED Results / Procedures / Treatments   Labs (all labs ordered are listed, but only  abnormal results are displayed) Labs Reviewed  BASIC METABOLIC PANEL WITH GFR - Abnormal; Notable for the following components:      Result Value   Sodium 134 (*)    Glucose, Bld 141 (*)    Calcium  8.8 (*)    All other components within normal limits  CBC - Abnormal; Notable for the following components:   RBC 4.13 (*)    Hemoglobin 8.4 (*)    HCT 28.3 (*)    MCV 68.5 (*)    MCH 20.3 (*)    MCHC 29.7 (*)    RDW 19.7 (*)    All other components within normal limits  PRO BRAIN NATRIURETIC PEPTIDE - Abnormal; Notable for the following components:   Pro Brain Natriuretic Peptide 10,855.0 (*)    All other components within normal limits  PROTIME-INR - Abnormal; Notable for the following components:   Prothrombin Time 16.5 (*)    INR 1.3 (*)    All  other components within normal limits  TROPONIN T, HIGH SENSITIVITY - Abnormal; Notable for the following components:   Troponin T High Sensitivity 27 (*)    All other components within normal limits  TROPONIN T, HIGH SENSITIVITY     EKG  EKG shows, sinus rhythm with PVCs, rate 100, normal QS, no obvious ischemic ST elevation, T wave flattening to 3, not significantly to compare to prior   RADIOLOGY On my independent interpretation, chest x-ray shows pulmonary edema   PROCEDURES:  Critical Care performed: No  Procedures   MEDICATIONS ORDERED IN ED: Medications  furosemide  (LASIX ) 120 mg in dextrose  5 % 50 mL IVPB (0 mg Intravenous Stopped 12/09/23 2130)  oxyCODONE  (Oxy IR/ROXICODONE ) immediate release tablet 10 mg (10 mg Oral Given 12/09/23 2113)     IMPRESSION / MDM / ASSESSMENT AND PLAN / ED COURSE  I reviewed the triage vital signs and the nursing notes.                              Differential diagnosis includes, but is not limited to, CHF exacerbation, volume overload, atypical ACS, did consider PE but patient has crackles on exam and evidence of volume overload, favor CHF exacerbation above this at this time.  No  wheezing on exam to suggest asthma or COPD, he denies any cough or fever to suggest viral illness or pneumonia.  Will order IV Lasix  for him.  Patient's presentation is most consistent with acute presentation with potential threat to life or bodily function.  Independent interpretation of labs and imaging below.  Given his worsening swelling, crackles on exam despite taking his p.o. torsemide , he will need to be admitted for further IV diuresis.  Consult hospitalist who will admit the patient.  He is admitted.    Clinical Course as of 12/09/23 2257  Sat Dec 09, 2023  1927 DG Chest 2 View 1. Interstitial and alveolar edema with small bilateral pleural effusions.  [TT]  2248 Independent review of labs, electrolytes not severely deranged, no leukocytosis, BNP is elevated.  Troponin is mildly elevated, suspect demand since he has no chest pain. [TT]    Clinical Course User Index [TT] Waymond Lorelle Cummins, MD     FINAL CLINICAL IMPRESSION(S) / ED DIAGNOSES   Final diagnoses:  Acute on chronic congestive heart failure, unspecified heart failure type (HCC)  SOB (shortness of breath)  Leg swelling     Rx / DC Orders   ED Discharge Orders     None        Note:  This document was prepared using Dragon voice recognition software and may include unintentional dictation errors.    Waymond Lorelle Cummins, MD 12/09/23 [phone]

## 2023-12-09 NOTE — ED Notes (Signed)
 Port accessed by crystal, G. Labs collected, blue, purple, L green, Red, light red and grey tops collected and sent to lab.

## 2023-12-09 NOTE — H&P (Incomplete)
 [name]   PATIENT NAME: Erik Martinez    MR#:  [id number]  DATE OF BIRTH:  1955/06/18  DATE OF ADMISSION:  12/09/2023  PRIMARY CARE PHYSICIAN: [name], MD   Patient is coming from: Home  REQUESTING/REFERRING PHYSICIAN: Waymond Lorelle Cummins  CHIEF COMPLAINT:   Chief Complaint  Patient presents with  . Shortness of Breath    HISTORY OF PRESENT ILLNESS:  Erik Martinez is a 68 y.o. male with medical history significant for diastolic CHF, osteoarthritis, bipolar disorder, GERD, hepatitis B and C, dyslipidemia, type 2 diabetes mellitus and peripheral vascular disease, who presented to the emergency room with acute onset of worsening dyspnea with associated orthopnea and lower extremity edema over the last few days.  No chest pain or palpitations.  He admitted to dyspnea on exertion as well as paroxysmal nocturnal dyspnea.  He denied any cough or wheezing.  No dysuria, oliguria or hematuria or flank pain.  No fever or chills with no nausea or vomiting or abdominal pain.  No melena or bright red bleeding per rectum.  No other bleeding diathesis.  ED Course: When the patient came to the ER, heart rate was 102 with otherwise normal vital signs.  Labs revealed mild hyponatremia 134 and glucose 141 with calcium  8.8.  CBC showed anemia with hemoglobin 8.4 hematocrit 28.3 compared to 9.1 and 29.6 on 11//2025.  He also showed microcytosis.  proBNP was 10,855 compared to 2433 on 1117/25.  High sensitive troponin T was 27.  INR is 1.3 and PT 16.5. EKG as reviewed by me : *** Imaging: *** PAST MEDICAL HISTORY:   Past Medical History:  Diagnosis Date  . (HFpEF) heart failure with preserved ejection fraction (HCC)   . Adenocarcinoma, colon (HCC) 08/25/2023  . Anemia   . Anxiety   . Aortic atherosclerosis   . Arthritis   . Avascular necrosis of bones of both hips (HCC)   . Bipolar disorder (HCC)   . Bladder spasms   . Chronic pain of left knee   . Chronic pain syndrome    a.)  followed by pain clinic in [place]  . Chronic, continuous use of opioids    a.) managed by pain clinic in [place]  . DDD (degenerative disc disease)   . Depression   . Erectile dysfunction    a.) on PDE5i (tadalafil)  . Frequency-urgency syndrome   . GERD (gastroesophageal reflux disease)   . Hepatitis C 2018  . History of anesthesia complications    a.) PONV; b.) PDPH; c.) difficult to sedate  . History of hepatitis B    1983  TX'D--  NO ISSUES OR SYMPTOMS SINCE  . Hyperlipidemia   . Hypotestosteronemia   . Insomnia    a.) on hypnotic PRN (zolpidem )  . Multiple lung nodules on CT    a.) CT CAP 08/25/2023: along oblique fissures - favored to be benign  . 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 Neurogenic bladder   . Nocturia   . PAD (peripheral artery disease)   . Polysubstance abuse (HCC)    a.) cocaine + marijuana + BZO + opioids  . PONV (postoperative nausea and vomiting)   . Spinal headache   . T2DM (type 2 diabetes mellitus) (HCC)   . Urine incontinence     PAST SURGICAL HISTORY:   Past Surgical History:  Procedure Laterality Date  . APPLICATION OF WOUND VAC Left 09/11/2021   Procedure: APPLICATION OF WOUND VAC;  Surgeon: Hooten, James P,  MD;  Location: [facility];  Service: Orthopedics;  Laterality: Left;  [id number]   . BACK SURGERY     2 rods and 4 screws artificial disc  . COLONOSCOPY  2008  . COLONOSCOPY N/A 08/25/2023   Procedure: COLONOSCOPY;  Surgeon: [name], MD;  Location: [facility];  Service: Endoscopy;  Laterality: N/A;  . ESOPHAGOGASTRODUODENOSCOPY N/A 08/25/2023   Procedure: EGD (ESOPHAGOGASTRODUODENOSCOPY);  Surgeon: [name], MD;  Location: [facility] ENDOSCOPY;  Service: Endoscopy;  Laterality: N/A;  . INTERSTIM IMPLANT PLACEMENT  2007  . INTERSTIM IMPLANT REVISION N/A 08/17/2012   Procedure: REPLACMENT OF IPG PLUS REPLACE LEAD OF INTERSTIM IMPLANT ;  Surgeon: [name], MD;  Location: [facility];  Service: Urology;  Laterality:  N/A;  . IRRIGATION AND DEBRIDEMENT KNEE Left 09/11/2021   Procedure: IRRIGATION AND DEBRIDEMENT WITH POLY EXCHANGE LEFT KNEE;  Surgeon: [name], MD;  Location: [facility];  Service: Orthopedics;  Laterality: Left;  . IRRIGATION AND DEBRIDEMENT KNEE Left 06/01/2022   Procedure: IRRIGATION AND DEBRIDEMENT KNEE;  Surgeon: [name], MD;  Location: [facility];  Service: Orthopedics;  Laterality: Left;  . LOWER EXTREMITY ANGIOGRAPHY Left 11/17/2020   Procedure: LOWER EXTREMITY ANGIOGRAPHY;  Surgeon: [name], MD;  Location: [facility];  Service: Cardiovascular;  Laterality: Left;  . LOWER EXTREMITY ANGIOGRAPHY Right 12/22/2020   Procedure: LOWER EXTREMITY ANGIOGRAPHY;  Surgeon: [name], MD;  Location: [facility];  Service: Cardiovascular;  Laterality: Right;  . LOWER EXTREMITY ANGIOGRAPHY Left 12/14/2021   Procedure: Lower Extremity Angiography;  Surgeon: [name], MD;  Location: [facility];  Service: Cardiovascular;  Laterality: Left;  . LUMBAR DISC SURGERY  2005   L5  . LUMBAR FUSION  X2  2006  &  2007  . MANIPULATION KNEE JOINT Left 08/2002  . OPEN DEBRIDEMENT LEFT TOTAL KNEE (SCAR, BONEY GRAOWTH)/ REMOVAL OLD SUTURES  05/01/2007  . PARTIAL HIP ARTHROPLASTY Left 2001   x2  . PORTACATH PLACEMENT N/A 10/27/2023   Procedure: INSERTION, TUNNELED CENTRAL VENOUS DEVICE, WITH PORT;  Surgeon: Tye Millet, DO;  Location: [facility];  Service: General;  Laterality: N/A;  . SHOULDER OPEN ROTATOR CUFF REPAIR Left 2000  . TOTAL HIP ARTHROPLASTY Left 2002  . TOTAL KNEE ARTHROPLASTY Left 06/2002   PARTIAL LEFT KNEE REPLACEMENT PRIOR TO THIS  . TOTAL KNEE REVISION Left 2008  . TOTAL KNEE REVISION Left 07/19/2021   Procedure: TOTAL KNEE REVISION;  Surgeon: [name], MD;  Location: [facility];  Service: Orthopedics;  Laterality: Left;  . TOTAL KNEE REVISION Left 2005  . TOTAL KNEE REVISION Left 06/01/2022   Procedure: TOTAL KNEE  REVISION;  Surgeon: [name], MD;  Location: [facility];  Service: Orthopedics;  Laterality: Left;  . TOTAL KNEE REVISION Left 04/01/2022   Procedure: Removal of left total knee implants with insertion of spacer.;  Surgeon: [name], MD;  Location: [facility];  Service: Orthopedics;  Laterality: Left;  . TRIGGER FINGER RELEASE Bilateral    SEVERAL FINGERS  . TRIGGER FINGER RELEASE Right 06/18/2014   Procedure: RELEASE TRIGGER FINGER/A-1 PULLEY;  Surgeon: Lynwood SQUIBB Mardee, MD;  Location: [facility];  Service: Orthopedics;  Laterality: Right;    SOCIAL HISTORY:   Social History   Tobacco Use  . Smoking status: Never  . Smokeless tobacco: Never  Substance Use Topics  . Alcohol use: Not Currently    Alcohol/week: 0.0 - 2.0 standard drinks of alcohol  FAMILY HISTORY:   Family History  Problem Relation Age of Onset  . Diabetes Mother   . Arthritis Mother   . Cancer Mother        lung cancer  . Hyperlipidemia Mother   . Stroke Mother   . Alcohol abuse Father   . Cancer Father        lung  . Arthritis Father   . Diabetes Brother     DRUG ALLERGIES:   Allergies  Allergen Reactions  . Suboxone [Buprenorphine Hcl-Naloxone  Hcl] Nausea And Vomiting  . Tape Other (See Comments)    [place] *Paper Tape is ok   . Codeine Rash, Itching and Nausea Only  . Morphine  And Codeine Itching and Rash  . Other Rash    Telemetry electrodes  . Silicone Rash    [place] -*Paper Tape is ok     REVIEW OF SYSTEMS:   ROS As per history of present illness. All pertinent systems were reviewed above. Constitutional, HEENT, cardiovascular, respiratory, GI, GU, musculoskeletal, neuro, psychiatric, endocrine, integumentary and hematologic systems were reviewed and are otherwise negative/unremarkable except for positive findings mentioned above in the HPI.   MEDICATIONS AT HOME:   Prior to Admission medications   Medication Sig Start Date End Date Taking? Authorizing Provider  amLODipine   (NORVASC ) 5 MG tablet Take 1 tablet (5 mg total) by mouth daily. 11/07/23 02/05/24  Zenaida Morene PARAS, MD  atorvastatin  (LIPITOR) 20 MG tablet Take 20 mg by mouth at bedtime.    [provider]  celecoxib  (CELEBREX ) 200 MG capsule Take 1 capsule (200 mg total) by mouth 2 (two) times daily. 11/17/23   Bedsole, Amy E, MD  Continuous Glucose Sensor (FREESTYLE LIBRE 3 PLUS SENSOR) MISC CHANCE SENSOR EVERY 15 DAYS 08/02/23   Bedsole, Amy E, MD  cyclobenzaprine  (FLEXERIL ) 10 MG tablet Take 10 mg by mouth 3 (three) times daily. 05/04/21   [provider]  dexamethasone  (DECADRON ) 0.5 MG/5ML solution Take 10 mLs (1 mg total) by mouth in the morning, at noon, in the evening, and at bedtime. Swish 10 ml (1 mg total) of solution in mouth for 2 minutes then spit. Repeat every 6 hours. Do not swallow. Do not take anything by mouth for 1 hour after taking. For mucositis. 11/14/23   Dasie Tinnie MATSU, NP  diazepam  (VALIUM ) 5 MG tablet TAKE 1 TABLET BY MOUTH AT BEDTIME 11/21/23   [name], MD  empagliflozin  (JARDIANCE ) 25 MG TABS tablet Take 1 tablet (25 mg total) by mouth daily. 01/20/21   [name], Rosaline RAMAN, FNP  fentaNYL  (DURAGESIC ) 25 MCG/HR Place 1 patch onto the skin every 3 (three) days. 11/28/23   [provider]  insulin  aspart (NOVOLOG ) 100 UNIT/ML injection Per sliding scale  MAX 15 units in 24 hour period. 10/27/22   Bedsole, Amy E, MD  iron  polysaccharides (NIFEREX) 150 MG capsule Take 1 capsule (150 mg total) by mouth daily. 08/26/23   Barbarann Nest, MD  lidocaine  (LIDODERM ) 5 % Place 3 patches onto the skin daily. Remove & Discard patch within 12 hours or as directed by MD 07/18/23   [name], MD  lidocaine -prilocaine  (EMLA ) cream Apply to affected area once 10/25/23   [name], MD  lubiprostone  (AMITIZA ) 8 MCG capsule Take 1 capsule (8 mcg total) by mouth 3 (three) times daily. 10/17/23   Bedsole, Amy E, MD  magic mouthwash w/lidocaine  SOLN Take 5 mLs by mouth 4  (four) times daily as needed for mouth pain. swish and  swallow 11/07/23   Borders, Fonda SAUNDERS, NP  ondansetron  (ZOFRAN ) 8 MG tablet Take 1 tablet (8 mg total) by mouth every 8 (eight) hours as needed for nausea or vomiting. 10/25/23   [name], MD  Oxycodone  HCl 20 MG TABS Take 1 tablet by mouth every 4 (four) hours as needed (Pain).    [provider]  pantoprazole  (PROTONIX ) 40 MG tablet Take 1 tablet (40 mg total) by mouth daily. 11/17/20   Schnier, Cordella MATSU, MD  polyethylene glycol (MIRALAX  / GLYCOLAX ) 17 g packet Take 17 g by mouth daily as needed for mild constipation. 08/25/23   Barbarann Nest, MD  potassium chloride  SA (KLOR-CON  M) 20 MEQ tablet Take 1 tablet (20 mEq total) by mouth daily. 11/27/23   Donette Ellouise LABOR, FNP  pregabalin  (LYRICA ) 200 MG capsule Take 1 capsule (200 mg total) by mouth in the morning, at noon, and at bedtime. 11/24/21   Bedsole, Amy E, MD  prochlorperazine  (COMPAZINE ) 10 MG tablet Take 1 tablet (10 mg total) by mouth every 6 (six) hours as needed for nausea or vomiting. 10/25/23   [name], MD  Semaglutide , 2 MG/DOSE, (OZEMPIC , 2 MG/DOSE,) 8 MG/3ML SOPN Inject 2 mg into the skin once a week. Monday 09/01/23   [name], MD  spironolactone  (ALDACTONE ) 25 MG tablet Take 1 tablet (25 mg total) by mouth daily. 08/28/23 11/26/24  Donette Ellouise LABOR, FNP  tadalafil (CIALIS) 20 MG tablet Take 20 mg by mouth daily as needed for erectile dysfunction.    [provider]  terbinafine  (LAMISIL ) 250 MG tablet TAKE 1 TABLET BY MOUTH ONCE DAILY 10/16/23   Evans, Brent M, DPM  torsemide  (DEMADEX ) 20 MG tablet Take 3 tablets (60 mg total) by mouth daily. 11/27/23   Donette Ellouise LABOR, FNP  triamcinolone  cream (KENALOG ) 0.5 % Apply 1 Application topically 2 (two) times daily. 10/24/23   Bedsole, Amy E, MD  zolpidem  (AMBIEN ) 10 MG tablet Take 1 tablet (10 mg total) by mouth at bedtime. 11/17/23   Bedsole, Amy E, MD      VITAL SIGNS:  Blood pressure (!) 140/75,  pulse 96, temperature 98.4 F (36.9 C), temperature source Oral, resp. rate (!) 21, height 5' 9 (1.753 m), SpO2 100%.  PHYSICAL EXAMINATION:  Physical Exam  GENERAL:  68 y.o.-year-old patient lying in the bed with no acute distress.  EYES: Pupils equal, round, reactive to light and accommodation. No scleral icterus. Extraocular muscles intact.  HEENT: Head atraumatic, normocephalic. Oropharynx and nasopharynx clear.  NECK:  Supple, no jugular venous distention. No thyroid  enlargement, no tenderness.  LUNGS: Normal breath sounds bilaterally, no wheezing, rales,rhonchi or crepitation. No use of accessory muscles of respiration.  CARDIOVASCULAR: Regular rate and rhythm, S1, S2 normal. No murmurs, rubs, or gallops.  ABDOMEN: Soft, nondistended, nontender. Bowel sounds present. No organomegaly or mass.  EXTREMITIES: No pedal edema, cyanosis, or clubbing.  NEUROLOGIC: Cranial nerves II through XII are intact. Muscle strength 5/5 in all extremities. Sensation intact. Gait not checked.  PSYCHIATRIC: The patient is alert and oriented x 3.  Normal affect and good eye contact. SKIN: No obvious rash, lesion, or ulcer.   LABORATORY PANEL:   CBC Recent Labs  Lab 12/09/23 1848  WBC 9.9  HGB 8.4*  HCT 28.3*  PLT 297   ------------------------------------------------------------------------------------------------------------------  Chemistries  Recent Labs  Lab 12/09/23 1848  NA 134*  K 3.9  CL 98  CO2 25  GLUCOSE 141*  BUN 11  CREATININE 0.88  CALCIUM  8.8*   ------------------------------------------------------------------------------------------------------------------  Cardiac Enzymes No results for input(s): TROPONINI in the last 168 hours. ------------------------------------------------------------------------------------------------------------------  RADIOLOGY:  DG Chest 2 View Result Date: 12/09/2023 EXAM: 2 VIEW(S) XRAY OF THE CHEST 12/09/2023 07:09:00 PM  COMPARISON: 10/27/2023 CLINICAL HISTORY: SOB FINDINGS: LINES, TUBES AND DEVICES: Right chest port in place. LUNGS AND PLEURA: Pulmonary interstitial prominence worsen compared to 10 / 17 / 25. Patchy bibasilar airspace opacities. Small bilateral pleural effusions. No pneumothorax. HEART AND MEDIASTINUM: Cardiomegaly. Calcified aorta. BONES AND SOFT TISSUES: No acute osseous abnormality. IMPRESSION: 1. Interstitial and alveolar edema with small bilateral pleural effusions. Electronically signed by: Norman Gatlin MD 12/09/2023 07:14 PM EST RP Workstation: [id number]      IMPRESSION AND PLAN:  Assessment and Plan: No notes have been filed under this hospital service. Service: Hospitalist      DVT prophylaxis: Lovenox ***  Advanced Care Planning:  Code Status: full code***  Family Communication:  The plan of care was discussed in details with the patient (and family). I answered all questions. The patient agreed to proceed with the above mentioned plan. Further management will depend upon hospital course. Disposition Plan: Back to previous home environment Consults called: none***  All the records are reviewed and case discussed with ED provider.  Status is: Inpatient {Inpatient:23812}   At the time of the admission, it appears that the appropriate admission status for this patient is inpatient.  This is judged to be reasonable and necessary in order to provide the required intensity of service to ensure the patient's safety given the presenting symptoms, physical exam findings and initial radiographic and laboratory data in the context of comorbid conditions.  The patient requires inpatient status due to high intensity of service, high risk of further deterioration and high frequency of surveillance required.  I certify that at the time of admission, it is my clinical judgment that the patient will require inpatient hospital care extending more than 2 midnights.                             Dispo: The patient is from: Home              Anticipated d/c is to: Home              Patient currently is not medically stable to d/c.              Difficult to place patient: No  Madison DELENA Peaches M.D on 12/09/2023 at 11:59 PM  [facility]   From 7 PM-7 AM, contact night-coverage [email or web address]  CC: Primary care physician; [name], MD

## 2023-12-09 NOTE — ED Notes (Signed)
 This RN called lab. They advised they do not have the blood I collected and sent down from patients port access I performed myself.

## 2023-12-09 NOTE — ED Triage Notes (Addendum)
 Pt to ed from home via [facility] fro SOB x 2 days. SOB upon exertion. Pt has edema in lower extremities and HX of CHF. Also DM pt.  100 HR  94% RA  141/80 187 BGL  98.7 T LS clear Pt is caox4, in no acute distress in triage. Pt is a current cancer treatment patient.

## 2023-12-09 NOTE — H&P (Signed)
 [place]   PATIENT NAME: Erik Martinez    MR#:  [id number]  DATE OF BIRTH:  May 10, 1955  DATE OF ADMISSION:  12/09/2023  PRIMARY CARE PHYSICIAN: [name], MD   Patient is coming from: Home  REQUESTING/REFERRING PHYSICIAN: Waymond Lorelle Cummins  CHIEF COMPLAINT:   Chief Complaint  Patient presents with   Shortness of Breath   HISTORY OF PRESENT ILLNESS:  Erik Martinez is a 68 y.o. Caucasian male with medical history significant for diastolic CHF, osteoarthritis, bipolar disorder, GERD, hepatitis B and C, dyslipidemia, type 2 diabetes mellitus and peripheral vascular disease, who presented to the emergency room with acute onset of worsening dyspnea with associated orthopnea and lower extremity edema over the last few days.  No chest pain or palpitations.  He admitted to dyspnea on exertion as well as paroxysmal nocturnal dyspnea.  He denied any cough or wheezing.  No dysuria, oliguria or hematuria or flank pain.  No fever or chills with no nausea or vomiting or abdominal pain.  No melena or bright red bleeding per rectum.  No other bleeding diathesis.  The patient was seen in the CHF clinic on 11/14 when his torsemide  was increased to 60 mg daily and he was started on potassium chloride  20 mcg p.o. daily.  He was advised to continue Jardiance  and Aldactone  he was advised to cut down on his p.o. fluid intake to be ideally around 64 ounces..  ED Course: When the patient came to the ER, heart rate was 102 with otherwise normal vital signs.  Labs revealed mild hyponatremia 134 and glucose 141 with calcium  8.8.  CBC showed anemia with hemoglobin 8.4 hematocrit 28.3 compared to 9.1 and 29.6 on 11//2025.  He also showed microcytosis.  proBNP was 10,855 compared to 2433 on 1117/25.  High sensitive troponin T was 27.  INR is 1.3 and PT 16.5. EKG as reviewed by me :  EKG showed sinus rhythm with a rate of 100 with occasional PVCs. Imaging: Abdominal and pelvic CT scan with contrast revealed  the following: 1. Circumferential luminal narrowing of the colon at the hepatic flexure consistent with primary colonic neoplasm. 2. No evidence of metastatic adenopathy in the abdomen pelvis. 3. No evidence of liver metastasis. 4.  Aortic Atherosclerosis.  The patient was given 10 mg of p.o. oxycodone  and 125 mg of IV Lasix .  He will be admitted to a progressive unit bed for further evaluation and management. PAST MEDICAL HISTORY:   Past Medical History:  Diagnosis Date   (HFpEF) heart failure with preserved ejection fraction (HCC)    Adenocarcinoma, colon (HCC) 08/25/2023   Anemia    Anxiety    Aortic atherosclerosis    Arthritis    Avascular necrosis of bones of both hips (HCC)    Bipolar disorder (HCC)    Bladder spasms    Chronic pain of left knee    Chronic pain syndrome    a.) followed by pain clinic in [place]   Chronic, continuous use of opioids    a.) managed by pain clinic in [place]   DDD (degenerative disc disease)    Depression    Erectile dysfunction    a.) on PDE5i (tadalafil)   Frequency-urgency syndrome    GERD (gastroesophageal reflux disease)    Hepatitis C 2018   History of anesthesia complications    a.) PONV; b.) PDPH; c.) difficult to sedate   History of hepatitis B    1983  TX'D--  NO  ISSUES OR SYMPTOMS SINCE   Hyperlipidemia    Hypotestosteronemia    Insomnia    a.) on hypnotic PRN (zolpidem )   Multiple lung nodules on CT    a.) CT CAP 08/25/2023: along oblique fissures - favored to be benign   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   Neurogenic bladder    Nocturia    PAD (peripheral artery disease)    Polysubstance abuse (HCC)    a.) cocaine + marijuana + BZO + opioids   PONV (postoperative nausea and vomiting)    Spinal headache    T2DM (type 2 diabetes mellitus) (HCC)    Urine incontinence     PAST SURGICAL HISTORY:   Past Surgical History:  Procedure Laterality Date   APPLICATION OF WOUND VAC Left 09/11/2021   Procedure: APPLICATION OF  WOUND VAC;  Surgeon: [name], MD;  Location: [facility];  Service: Orthopedics;  Laterality: Left;  [id number]    BACK SURGERY     2 rods and 4 screws artificial disc   COLONOSCOPY  2008   COLONOSCOPY N/A 08/25/2023   Procedure: COLONOSCOPY;  Surgeon: [name], MD;  Location: [facility] ENDOSCOPY;  Service: Endoscopy;  Laterality: N/A;   ESOPHAGOGASTRODUODENOSCOPY N/A 08/25/2023   Procedure: EGD (ESOPHAGOGASTRODUODENOSCOPY);  Surgeon: [name], MD;  Location: [facility];  Service: Endoscopy;  Laterality: N/A;   INTERSTIM IMPLANT PLACEMENT  2007   INTERSTIM IMPLANT REVISION N/A 08/17/2012   Procedure: REPLACMENT OF IPG PLUS REPLACE LEAD OF INTERSTIM IMPLANT ;  Surgeon: [name], MD;  Location: [facility];  Service: Urology;  Laterality: N/A;   IRRIGATION AND DEBRIDEMENT KNEE Left 09/11/2021   Procedure: IRRIGATION AND DEBRIDEMENT WITH POLY EXCHANGE LEFT KNEE;  Surgeon: [name], MD;  Location: [facility];  Service: Orthopedics;  Laterality: Left;   IRRIGATION AND DEBRIDEMENT KNEE Left 06/01/2022   Procedure: IRRIGATION AND DEBRIDEMENT KNEE;  Surgeon: [name], MD;  Location: [facility];  Service: Orthopedics;  Laterality: Left;   LOWER EXTREMITY ANGIOGRAPHY Left 11/17/2020   Procedure: LOWER EXTREMITY ANGIOGRAPHY;  Surgeon: [name], MD;  Location: [facility] INVASIVE CV LAB;  Service: Cardiovascular;  Laterality: Left;   LOWER EXTREMITY ANGIOGRAPHY Right 12/22/2020   Procedure: LOWER EXTREMITY ANGIOGRAPHY;  Surgeon: [name], MD;  Location: [facility];  Service: Cardiovascular;  Laterality: Right;   LOWER EXTREMITY ANGIOGRAPHY Left 12/14/2021   Procedure: Lower Extremity Angiography;  Surgeon: [name], MD;  Location: [facility];  Service: Cardiovascular;  Laterality: Left;   LUMBAR DISC SURGERY  2005   L5   LUMBAR FUSION  X2  2006  &  2007   MANIPULATION KNEE JOINT Left 08/2002   OPEN DEBRIDEMENT LEFT  TOTAL KNEE (SCAR, BONEY GRAOWTH)/ REMOVAL OLD SUTURES  05/01/2007   PARTIAL HIP ARTHROPLASTY Left 2001   x2   PORTACATH PLACEMENT N/A 10/27/2023   Procedure: INSERTION, TUNNELED CENTRAL VENOUS DEVICE, WITH PORT;  Surgeon: Tye Millet, DO;  Location: [facility];  Service: General;  Laterality: N/A;   SHOULDER OPEN ROTATOR CUFF REPAIR Left 2000   TOTAL HIP ARTHROPLASTY Left 2002   TOTAL KNEE ARTHROPLASTY Left 06/2002   PARTIAL LEFT KNEE REPLACEMENT PRIOR TO THIS   TOTAL KNEE REVISION Left 2008   TOTAL KNEE REVISION Left 07/19/2021   Procedure: TOTAL KNEE REVISION;  Surgeon: [name], MD;  Location: [facility];  Service: Orthopedics;  Laterality: Left;   TOTAL KNEE REVISION Left 2005   TOTAL KNEE REVISION Left  06/01/2022   Procedure: TOTAL KNEE REVISION;  Surgeon: [name], MD;  Location: [facility];  Service: Orthopedics;  Laterality: Left;   TOTAL KNEE REVISION Left 04/01/2022   Procedure: Removal of left total knee implants with insertion of spacer.;  Surgeon: [name], MD;  Location: [facility];  Service: Orthopedics;  Laterality: Left;   TRIGGER FINGER RELEASE Bilateral    SEVERAL FINGERS   TRIGGER FINGER RELEASE Right 06/18/2014   Procedure: RELEASE TRIGGER FINGER/A-1 PULLEY;  Surgeon: Lynwood SQUIBB Mardee, MD;  Location: [facility];  Service: Orthopedics;  Laterality: Right;    SOCIAL HISTORY:   Social History   Tobacco Use   Smoking status: Never   Smokeless tobacco: Never  Substance Use Topics   Alcohol use: Not Currently    Alcohol/week: 0.0 - 2.0 standard drinks of alcohol    FAMILY HISTORY:   Family History  Problem Relation Age of Onset   Diabetes Mother    Arthritis Mother    Cancer Mother        lung cancer   Hyperlipidemia Mother    Stroke Mother    Alcohol abuse Father    Cancer Father        lung   Arthritis Father    Diabetes Brother     DRUG ALLERGIES:   Allergies  Allergen Reactions   Suboxone [Buprenorphine Hcl-Naloxone  Hcl] Nausea And  Vomiting   Tape Other (See Comments)    [place]ps *Paper Tape is ok    Codeine Rash, Itching and Nausea Only   Morphine  And Codeine Itching and Rash   Other Rash    Telemetry electrodes   Silicone Rash    [place]ps -*Paper Tape is ok     REVIEW OF SYSTEMS:   ROS As per history of present illness. All pertinent systems were reviewed above. Constitutional, HEENT, cardiovascular, respiratory, GI, GU, musculoskeletal, neuro, psychiatric, endocrine, integumentary and hematologic systems were reviewed and are otherwise negative/unremarkable except for positive findings mentioned above in the HPI.   MEDICATIONS AT HOME:   Prior to Admission medications   Medication Sig Start Date End Date Taking? Authorizing Provider  amLODipine  (NORVASC ) 5 MG tablet Take 1 tablet (5 mg total) by mouth daily. 11/07/23 02/05/24  Zenaida Morene PARAS, MD  atorvastatin  (LIPITOR) 20 MG tablet Take 20 mg by mouth at bedtime.    [provider]  celecoxib  (CELEBREX ) 200 MG capsule Take 1 capsule (200 mg total) by mouth 2 (two) times daily. 11/17/23   Bedsole, Amy E, MD  Continuous Glucose Sensor (FREESTYLE LIBRE 3 PLUS SENSOR) MISC CHANCE SENSOR EVERY 15 DAYS 08/02/23   Bedsole, Amy E, MD  cyclobenzaprine  (FLEXERIL ) 10 MG tablet Take 10 mg by mouth 3 (three) times daily. 05/04/21   [provider]  dexamethasone  (DECADRON ) 0.5 MG/5ML solution Take 10 mLs (1 mg total) by mouth in the morning, at noon, in the evening, and at bedtime. Swish 10 ml (1 mg total) of solution in mouth for 2 minutes then spit. Repeat every 6 hours. Do not swallow. Do not take anything by mouth for 1 hour after taking. For mucositis. 11/14/23   Dasie Tinnie MATSU, NP  diazepam  (VALIUM ) 5 MG tablet TAKE 1 TABLET BY MOUTH AT BEDTIME 11/21/23   [name], MD  empagliflozin  (JARDIANCE ) 25 MG TABS tablet Take 1 tablet (25 mg total) by mouth daily. 01/20/21   [name], Rosaline RAMAN, FNP  fentaNYL  (DURAGESIC ) 25 MCG/HR Place 1 patch onto the  skin every 3 (three)  days. 11/28/23   [provider]  insulin  aspart (NOVOLOG ) 100 UNIT/ML injection Per sliding scale  MAX 15 units in 24 hour period. 10/27/22   Bedsole, Amy E, MD  iron  polysaccharides (NIFEREX) 150 MG capsule Take 1 capsule (150 mg total) by mouth daily. 08/26/23   Barbarann Nest, MD  lidocaine  (LIDODERM ) 5 % Place 3 patches onto the skin daily. Remove & Discard patch within 12 hours or as directed by MD 07/18/23   [name], MD  lidocaine -prilocaine  (EMLA ) cream Apply to affected area once 10/25/23   [name], MD  lubiprostone  (AMITIZA ) 8 MCG capsule Take 1 capsule (8 mcg total) by mouth 3 (three) times daily. 10/17/23   Bedsole, Amy E, MD  magic mouthwash w/lidocaine  SOLN Take 5 mLs by mouth 4 (four) times daily as needed for mouth pain. swish and swallow 11/07/23   Borders, Joshua R, NP  ondansetron  (ZOFRAN ) 8 MG tablet Take 1 tablet (8 mg total) by mouth every 8 (eight) hours as needed for nausea or vomiting. 10/25/23   [name], MD  Oxycodone  HCl 20 MG TABS Take 1 tablet by mouth every 4 (four) hours as needed (Pain).    [provider]  pantoprazole  (PROTONIX ) 40 MG tablet Take 1 tablet (40 mg total) by mouth daily. 11/17/20   Schnier, Cordella MATSU, MD  polyethylene glycol (MIRALAX  / GLYCOLAX ) 17 g packet Take 17 g by mouth daily as needed for mild constipation. 08/25/23   Barbarann Nest, MD  potassium chloride  SA (KLOR-CON  M) 20 MEQ tablet Take 1 tablet (20 mEq total) by mouth daily. 11/27/23   Donette Ellouise LABOR, FNP  pregabalin  (LYRICA ) 200 MG capsule Take 1 capsule (200 mg total) by mouth in the morning, at noon, and at bedtime. 11/24/21   Bedsole, Amy E, MD  prochlorperazine  (COMPAZINE ) 10 MG tablet Take 1 tablet (10 mg total) by mouth every 6 (six) hours as needed for nausea or vomiting. 10/25/23   [name], MD  Semaglutide , 2 MG/DOSE, (OZEMPIC , 2 MG/DOSE,) 8 MG/3ML SOPN Inject 2 mg into the skin once a week. Monday 09/01/23   [name], MD  spironolactone  (ALDACTONE ) 25 MG tablet Take 1 tablet (25 mg total) by mouth daily. 08/28/23 11/26/24  Donette Ellouise LABOR, FNP  tadalafil (CIALIS) 20 MG tablet Take 20 mg by mouth daily as needed for erectile dysfunction.    [provider]  terbinafine  (LAMISIL ) 250 MG tablet TAKE 1 TABLET BY MOUTH ONCE DAILY 10/16/23   Evans, Brent M, DPM  torsemide  (DEMADEX ) 20 MG tablet Take 3 tablets (60 mg total) by mouth daily. 11/27/23   Donette Ellouise LABOR, FNP  triamcinolone  cream (KENALOG ) 0.5 % Apply 1 Application topically 2 (two) times daily. 10/24/23   Bedsole, Amy E, MD  zolpidem  (AMBIEN ) 10 MG tablet Take 1 tablet (10 mg total) by mouth at bedtime. 11/17/23   Bedsole, Amy E, MD      VITAL SIGNS:  Blood pressure (!) 140/75, pulse 96, temperature 98.4 F (36.9 C), temperature source Oral, resp. rate (!) 21, height 5' 9 (1.753 m), SpO2 100%.  PHYSICAL EXAMINATION:  Physical Exam  GENERAL:  68 y.o.-year-old patient lying in the bed with no acute distress.  EYES: Pupils equal, round, reactive to light and accommodation. No scleral icterus. Extraocular muscles intact.  HEENT: Head atraumatic, normocephalic. Oropharynx and nasopharynx clear.  NECK:  Supple, no jugular venous distention. No thyroid  enlargement, no tenderness.  LUNGS: Normal breath sounds bilaterally, no  wheezing, rales,rhonchi or crepitation. No use of accessory muscles of respiration.  CARDIOVASCULAR: Regular rate and rhythm, S1, S2 normal. No murmurs, rubs, or gallops.  ABDOMEN: Soft, nondistended, nontender. Bowel sounds present. No organomegaly or mass.  EXTREMITIES: No pedal edema, cyanosis, or clubbing.  NEUROLOGIC: Cranial nerves II through XII are intact. Muscle strength 5/5 in all extremities. Sensation intact. Gait not checked.  PSYCHIATRIC: The patient is alert and oriented x 3.  Normal affect and good eye contact. SKIN: No obvious rash, lesion, or ulcer.   LABORATORY PANEL:   CBC Recent Labs  Lab  12/09/23 1848  WBC 9.9  HGB 8.4*  HCT 28.3*  PLT 297   ------------------------------------------------------------------------------------------------------------------  Chemistries  Recent Labs  Lab 12/09/23 1848  NA 134*  K 3.9  CL 98  CO2 25  GLUCOSE 141*  BUN 11  CREATININE 0.88  CALCIUM  8.8*   ------------------------------------------------------------------------------------------------------------------  Cardiac Enzymes No results for input(s): TROPONINI in the last 168 hours. ------------------------------------------------------------------------------------------------------------------  RADIOLOGY:  DG Chest 2 View Result Date: 12/09/2023 EXAM: 2 VIEW(S) XRAY OF THE CHEST 12/09/2023 07:09:00 PM COMPARISON: 10/27/2023 CLINICAL HISTORY: SOB FINDINGS: LINES, TUBES AND DEVICES: Right chest port in place. LUNGS AND PLEURA: Pulmonary interstitial prominence worsen compared to 10 / 17 / 25. Patchy bibasilar airspace opacities. Small bilateral pleural effusions. No pneumothorax. HEART AND MEDIASTINUM: Cardiomegaly. Calcified aorta. BONES AND SOFT TISSUES: No acute osseous abnormality. IMPRESSION: 1. Interstitial and alveolar edema with small bilateral pleural effusions. Electronically signed by: Norman Gatlin MD 12/09/2023 07:14 PM EST RP Workstation: [id number]      IMPRESSION AND PLAN:  Assessment and Plan: * Acute on chronic diastolic CHF (congestive heart failure) (HCC)  -The patient will be admitted to a cardiac telemetry bed. - We will continue diuresis with IV Lasix  to replace his Demadex . - Will continue Jardiance  and Aldactone . - We Will follow serial troponins. - We will follow I's and O's and daily weights. - Cardiology consult be obtained. - I notified [facility] group about the patient.   Essential hypertension - Continue antihypertensive therapy.  Dyslipidemia - Will continue statin therapy.  GERD without esophagitis Will continue PPI  therapy.  IBS (irritable bowel syndrome) - Continue Amitiza .   DVT prophylaxis: Lovenox .  Advanced Care Planning:  Code Status: full code.  Family Communication:  The plan of care was discussed in details with the patient (and family). I answered all questions. The patient agreed to proceed with the above mentioned plan. Further management will depend upon hospital course. Disposition Plan: Back to previous home environment Consults called: Cardiology All the records are reviewed and case discussed with ED provider.  Status is: Inpatient  At the time of the admission, it appears that the appropriate admission status for this patient is inpatient.  This is judged to be reasonable and necessary in order to provide the required intensity of service to ensure the patient's safety given the presenting symptoms, physical exam findings and initial radiographic and laboratory data in the context of comorbid conditions.  The patient requires inpatient status due to high intensity of service, high risk of further deterioration and high frequency of surveillance required.  I certify that at the time of admission, it is my clinical judgment that the patient will require inpatient hospital care extending more than 2 midnights.                            Dispo: The patient is from: Home  Anticipated d/c is to: Home              Patient currently is not medically stable to d/c.              Difficult to place patient: No  [place]ison DELENA Peaches M.D on 12/10/2023 at 12:21 AM  [facility]   From 7 PM-7 AM, contact night-coverage [email or web address]  CC: Primary care physician; [name], MD

## 2023-12-10 DIAGNOSIS — E785 Hyperlipidemia, unspecified: Secondary | ICD-10-CM | POA: Diagnosis not present

## 2023-12-10 DIAGNOSIS — I10 Essential (primary) hypertension: Secondary | ICD-10-CM | POA: Diagnosis not present

## 2023-12-10 DIAGNOSIS — K589 Irritable bowel syndrome without diarrhea: Secondary | ICD-10-CM | POA: Insufficient documentation

## 2023-12-10 DIAGNOSIS — K219 Gastro-esophageal reflux disease without esophagitis: Secondary | ICD-10-CM

## 2023-12-10 DIAGNOSIS — I5031 Acute diastolic (congestive) heart failure: Secondary | ICD-10-CM

## 2023-12-10 DIAGNOSIS — I5033 Acute on chronic diastolic (congestive) heart failure: Secondary | ICD-10-CM | POA: Diagnosis not present

## 2023-12-10 LAB — CBC
HCT: 25.2 % — ABNORMAL LOW (ref 39.0–52.0)
Hemoglobin: 7.5 g/dL — ABNORMAL LOW (ref 13.0–17.0)
MCH: 20.4 pg — ABNORMAL LOW (ref 26.0–34.0)
MCHC: 29.8 g/dL — ABNORMAL LOW (ref 30.0–36.0)
MCV: 68.5 fL — ABNORMAL LOW (ref 80.0–100.0)
Platelets: 262 K/uL (ref 150–400)
RBC: 3.68 MIL/uL — ABNORMAL LOW (ref 4.22–5.81)
RDW: 19.4 % — ABNORMAL HIGH (ref 11.5–15.5)
WBC: 6.5 K/uL (ref 4.0–10.5)
nRBC: 0.3 % — ABNORMAL HIGH (ref 0.0–0.2)

## 2023-12-10 LAB — BASIC METABOLIC PANEL WITH GFR
Anion gap: 10 (ref 5–15)
BUN: 11 mg/dL (ref 8–23)
CO2: 27 mmol/L (ref 22–32)
Calcium: 8.1 mg/dL — ABNORMAL LOW (ref 8.9–10.3)
Chloride: 99 mmol/L (ref 98–111)
Creatinine, Ser: 0.89 mg/dL (ref 0.61–1.24)
GFR, Estimated: >60 mL/min (ref >60)
Glucose, Bld: 242 mg/dL — ABNORMAL HIGH (ref 70–99)
Potassium: 3.4 mmol/L — ABNORMAL LOW (ref 3.5–5.1)
Sodium: 135 mmol/L (ref 135–145)

## 2023-12-10 LAB — TROPONIN T, HIGH SENSITIVITY
Troponin T High Sensitivity: 30 ng/L — ABNORMAL HIGH (ref 0–19)
Troponin T High Sensitivity: 33 ng/L — ABNORMAL HIGH (ref 0–19)
Troponin T High Sensitivity: 33 ng/L — ABNORMAL HIGH (ref 0–19)

## 2023-12-10 LAB — GLUCOSE, CAPILLARY: Glucose-Capillary: 266 mg/dL — ABNORMAL HIGH (ref 70–99)

## 2023-12-10 MED ORDER — CYCLOBENZAPRINE HCL 10 MG PO TABS
10.0000 mg | ORAL_TABLET | Freq: Three times a day (TID) | ORAL | Status: DC
  Administered 2023-12-10 – 2023-12-12 (×6): 10 mg via ORAL
  Filled 2023-12-10 (×6): qty 1

## 2023-12-10 MED ORDER — CHLORHEXIDINE GLUCONATE CLOTH 2 % EX PADS: 6.0000 | MEDICATED_PAD | Freq: Every day | CUTANEOUS | Status: DC

## 2023-12-10 MED ORDER — HYDROMORPHONE HCL 1 MG/ML IJ SOLN
0.5000 mg | INTRAMUSCULAR | Status: DC | PRN
  Administered 2023-12-10 – 2023-12-12 (×11): 0.5 mg via INTRAVENOUS
  Filled 2023-12-10 (×11): qty 0.5

## 2023-12-10 MED ORDER — INSULIN ASPART 100 UNIT/ML IJ SOLN
0.0000 [IU] | Freq: Three times a day (TID) | INTRAMUSCULAR | Status: DC
  Administered 2023-12-11 (×2): 5 [IU] via SUBCUTANEOUS
  Administered 2023-12-11: 3 [IU] via SUBCUTANEOUS
  Administered 2023-12-12 (×2): 5 [IU] via SUBCUTANEOUS
  Filled 2023-12-10 (×4): qty 5
  Filled 2023-12-10: qty 3

## 2023-12-10 MED ORDER — SODIUM CHLORIDE 0.9% FLUSH
10.0000 mL | Freq: Two times a day (BID) | INTRAVENOUS | Status: DC
  Administered 2023-12-11: 20 mL
  Administered 2023-12-11 (×2): 10 mL
  Administered 2023-12-12: 20 mL

## 2023-12-10 MED ORDER — SODIUM CHLORIDE 0.9% FLUSH: 10.0000 mL | INTRAVENOUS | Status: DC | PRN

## 2023-12-10 MED ORDER — POTASSIUM CHLORIDE CRYS ER 20 MEQ PO TBCR
40.0000 meq | EXTENDED_RELEASE_TABLET | Freq: Once | ORAL | Status: AC
  Administered 2023-12-10: 40 meq via ORAL
  Filled 2023-12-10: qty 2

## 2023-12-10 MED ORDER — INSULIN ASPART 100 UNIT/ML IJ SOLN
0.0000 [IU] | Freq: Every day | INTRAMUSCULAR | Status: DC
  Administered 2023-12-10: 3 [IU] via SUBCUTANEOUS
  Filled 2023-12-10: qty 3

## 2023-12-10 MED ORDER — ORAL CARE MOUTH RINSE: 15.0000 mL | OROMUCOSAL | Status: DC | PRN

## 2023-12-10 NOTE — Assessment & Plan Note (Addendum)
-  The patient will be admitted to a cardiac telemetry bed. - We will continue diuresis with IV Lasix  to replace his Demadex . - Will continue Jardiance  and Aldactone . - We Will follow serial troponins. - We will follow I's and O's and daily weights. - Cardiology consult be obtained. - I notified [facility] group about the patient.

## 2023-12-10 NOTE — Assessment & Plan Note (Signed)
 Continue Amitiza 

## 2023-12-10 NOTE — Assessment & Plan Note (Signed)
-   Will continue PPI therapy.

## 2023-12-10 NOTE — Assessment & Plan Note (Signed)
 -  Continue antihypertensive therapy ?

## 2023-12-10 NOTE — Assessment & Plan Note (Signed)
 Will continue statin therapy

## 2023-12-10 NOTE — Consult Note (Signed)
 Cardiology Consultation   Patient ID: Erik Martinez MRN: [id number]; DOB: [birth year]  Admit date: 12/09/2023 Date of Consult: 12/10/2023  PCP:  Erik Greig BRAVO, Martinez   [facility] Providers Cardiologist:  None   Patient Profile: Erik Martinez is a 68 y.o. male with a hx of diastolic CHF, osteoarthritis, bipolar disorder, GERD, hepatitis B&C, dyslipidemia, diabetes type 2, PVD who is being seen 12/10/2023 for the evaluation of CHF at the request of Dr. Leesa.  History of Present Illness: Erik Martinez has been seen by advanced heart failure team most recently.  Patient was admitted in August 2025 with shortness of breath and leg edema due to heart failure exacerbation due to severe anemia with hemoglobin of 7.3.  He was diuresed with IV Lasix , started on Protonix  and GI was consulted.  Echo showed EF 50 to 55%, grade 1 diastolic dysfunction, normal RV, mild to moderate MR.  EGD on 8 5844 was durable.  Colonoscopy 08/25/2023 with partially obstructing large mass in the transverse colon, likely malignant, biopsied.  Surgery consulted.  Chest/abdomen/pelvic CT 08/25/2023 showed 4 mm nodule, 5 mm nodule right lung, colon mass.  Patient was seen in the heart failure clinic 08/28/2023 where Lasix  20 mg daily and spironolactone  25 mg daily were started.  Admitted 09/13/2023 for laparoscopic right colectomy for invasive adenocarcinoma of right colon.  PICC line was placed for TPN.  He was seen in the clinic 11/07/2023 for acute visit for worsening shortness of breath and chest discomfort.  He ran out of Lasix  2 days prior and reported weight gain.  Patient was started on torsemide  40 mg daily.  Cardiac PET stress was ordered.  Patient presented to the ER 12/09/2023 with shortness of breath.  He reports worsening swelling over the last few days.  He denies chest pain, for cough, fever.  He felt torsemide  was not helping with his swelling.  EMS was called reported shortness of breath on exertion,  lower leg edema and 94% O2 on room air.  In the ER blood pressure 132/75, pulse rate 102 bpm, respiratory rate 20, afebrile, 100% O2.  Labs showed sodium 134, hemoglobin 8.4.  proBNP 10, 855, WBC 9.9.  High-sensitivity troponin 27, 33, 30.  EKG showed normal sinus rhythm, PVCs, 100 BPM, nonspecific T wave changes.  Chest x-ray showed interstitial and alveolar edema with small bilateral pleural effusions.  The patient was started on IV Lasix  drip and admitted for further workup.   Past Medical History:  Diagnosis Date   (HFpEF) heart failure with preserved ejection fraction (HCC)    Adenocarcinoma, colon (HCC) 08/25/2023   Anemia    Anxiety    Aortic atherosclerosis    Arthritis    Avascular necrosis of bones of both hips (HCC)    Bipolar disorder (HCC)    Bladder spasms    Chronic pain of left knee    Chronic pain syndrome    a.) followed by pain clinic in [place]   Chronic, continuous use of opioids    a.) managed by pain clinic in [place]   DDD (degenerative disc disease)    Depression    Erectile dysfunction    a.) on PDE5i (tadalafil)   Frequency-urgency syndrome    GERD (gastroesophageal reflux disease)    Hepatitis C 2018   History of anesthesia complications    a.) PONV; b.) PDPH; c.) difficult to sedate   History of hepatitis B    1983  TX'D--  NO ISSUES OR SYMPTOMS SINCE  Hyperlipidemia    Hypotestosteronemia    Insomnia    a.) on hypnotic PRN (zolpidem )   Multiple lung nodules on CT    a.) CT CAP 08/25/2023: along oblique fissures - favored to be benign   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   Neurogenic bladder    Nocturia    PAD (peripheral artery disease)    Polysubstance abuse (HCC)    a.) cocaine + marijuana + BZO + opioids   PONV (postoperative nausea and vomiting)    Spinal headache    T2DM (type 2 diabetes mellitus) (HCC)    Urine incontinence     Past Surgical History:  Procedure Laterality Date   APPLICATION OF WOUND VAC Left 09/11/2021   Procedure:  APPLICATION OF WOUND VAC;  Surgeon: [name], Martinez;  Location: [facility]S;  Service: Orthopedics;  Laterality: Left;  [id number]    BACK SURGERY     2 rods and 4 screws artificial disc   COLONOSCOPY  2008   COLONOSCOPY N/A 08/25/2023   Procedure: COLONOSCOPY;  Surgeon: Erik Carmine, Martinez;  [facility];  Service: Endoscopy;  Laterality: N/A;   ESOPHAGOGASTRODUODENOSCOPY N/A 08/25/2023   Procedure: EGD (ESOPHAGOGASTRODUODENOSCOPY);  Surgeon: Erik Carmine, Martinez;  Location: [facility] ENDOSCOPY;  Service: Endoscopy;  Laterality: N/A;   INTERSTIM IMPLANT PLACEMENT  2007   INTERSTIM IMPLANT REVISION N/A 08/17/2012   Procedure: REPLACMENT OF IPG PLUS REPLACE LEAD OF INTERSTIM IMPLANT ;  Surgeon: Erik DELENA Elizabeth, Martinez;  Location: [facility];  Service: Urology;  Laterality: N/A;   IRRIGATION AND DEBRIDEMENT KNEE Left 09/11/2021   Procedure: IRRIGATION AND DEBRIDEMENT WITH POLY EXCHANGE LEFT KNEE;  Surgeon: [name], Martinez;  Location: [facility] OR[facility];  Service: Orthopedics;  Laterality: Left;   IRRIGATION AND DEBRIDEMENT KNEE Left 06/01/2022   Procedure: IRRIGATION AND DEBRIDEMENT KNEE;  Surgeon: [name], Martinez;  Location: [facility];  Service: Orthopedics;  Laterality: Left;   LOWER EXTREMITY ANGIOGRAPHY Left 11/17/2020   Procedure: LOWER EXTREMITY ANGIOGRAPHY;  Surgeon: Erik Martinez;  Location: [facility] IN[facility]ASIVE [facility];  Service: Cardiovascular;  Laterality: Left;   LOWER EXTREMITY ANGIOGRAPHY Right 12/22/2020   Procedure: LOWER EXTREMITY ANGIOGRAPHY;  Surgeon: Erik Martinez;  Location: [facility];  Service: Cardiovascular;  Laterality: Right;   LOWER EXTREMITY ANGIOGRAPHY Left 12/14/2021   Procedure: Lower Extremity Angiography;  Surgeon: Erik Martinez;  Location: [facility];  Service: Cardiovascular;  Laterality: Left;   LUMBAR DISC SURGERY  2005   L5   LUMBAR FUSION  X2  2006  &  2007   MANIPULATION KNEE JOINT Left 08/2002   OPEN  DEBRIDEMENT LEFT TOTAL KNEE (SCAR, BONEY GRAOWTH)/ REMOVAL OLD SUTURES  05/01/2007   PARTIAL HIP ARTHROPLASTY Left 2001   x2   PORTACATH PLACEMENT N/A 10/27/2023   Procedure: INSERTION, TUNNELED CENTRAL VENOUS DEVICE, WITH PORT;  Surgeon: Tye Millet, DO;  Location: [facility];  Service: General;  Laterality: N/A;   SHOULDER OPEN ROTATOR CUFF REPAIR Left 2000   TOTAL HIP ARTHROPLASTY Left 2002   TOTAL KNEE ARTHROPLASTY Left 06/2002   PARTIAL LEFT KNEE REPLACEMENT PRIOR TO THIS   TOTAL KNEE REVISION Left 2008   TOTAL KNEE REVISION Left 07/19/2021   Procedure: TOTAL KNEE REVISION;  Surgeon: [name], Martinez;  Location: [facility];  Service: Orthopedics;  Laterality: Left;   TOTAL KNEE REVISION Left 2005   TOTAL KNEE REVISION Left 06/01/2022   Procedure: TOTAL KNEE REVISION;  Surgeon: Mardee Lynwood  P, Martinez;  Location: [facility];  Service: Orthopedics;  Laterality: Left;   TOTAL KNEE REVISION Left 04/01/2022   Procedure: Removal of left total knee implants with insertion of spacer.;  Surgeon: [name], Martinez;  Location: [facility];  Service: Orthopedics;  Laterality: Left;   TRIGGER FINGER RELEASE Bilateral    SEVERAL FINGERS   TRIGGER FINGER RELEASE Right 06/18/2014   Procedure: RELEASE TRIGGER FINGER/A-1 PULLEY;  Surgeon: Lynwood Erik Martinez Mardee, Martinez;  Location: [facility];  Service: Orthopedics;  Laterality: Right;     Home Medications:  Prior to Admission medications   Medication Sig Start Date End Date Taking? Authorizing Provider  amLODipine  (NORVASC ) 5 MG tablet Take 1 tablet (5 mg total) by mouth daily. 11/07/23 02/05/24 Yes Zenaida Morene PARAS, Martinez  atorvastatin  (LIPITOR) 20 MG tablet Take 20 mg by mouth at bedtime.   Yes Provider, Historical, Martinez  celecoxib  (CELEBREX ) 200 MG capsule Take 1 capsule (200 mg total) by mouth 2 (two) times daily. 11/17/23  Yes Bedsole, Amy E, Martinez  cyclobenzaprine  (FLEXERIL ) 10 MG tablet Take 10 mg by mouth 3 (three) times daily. 05/04/21  Yes Provider, Historical, Martinez   dexamethasone  (DECADRON ) 0.5 MG/5ML solution Take 10 mLs (1 mg total) by mouth in the morning, at noon, in the evening, and at bedtime. Swish 10 ml (1 mg total) of solution in mouth for 2 minutes then spit. Repeat every 6 hours. Do not swallow. Do not take anything by mouth for 1 hour after taking. For mucositis. 11/14/23  Yes Dasie Tinnie MATSU, NP  diazepam  (VALIUM ) 5 MG tablet TAKE 1 TABLET BY MOUTH AT BEDTIME 11/21/23  Yes [name], Martinez  empagliflozin  (JARDIANCE ) 25 MG TABS tablet Take 1 tablet (25 mg total) by mouth daily. 01/20/21  Yes [name], Rosaline RAMAN, FNP  fentaNYL  (DURAGESIC ) 25 MCG/HR Place 1 patch onto the skin every 3 (three) days. 11/28/23  Yes Provider, Historical, Martinez  insulin  aspart (NOVOLOG ) 100 UNIT/ML injection Per sliding scale  MAX 15 units in 24 hour period. 10/27/22  Yes Bedsole, Amy E, Martinez  iron  polysaccharides (NIFEREX) 150 MG capsule Take 1 capsule (150 mg total) by mouth daily. 08/26/23  Yes Barbarann Nest, Martinez  lidocaine  (LIDODERM ) 5 % Place 3 patches onto the skin daily. Remove & Discard patch within 12 hours or as directed by Martinez 07/18/23  Yes Erik, Amy E, Martinez  lidocaine -prilocaine  (EMLA ) cream Apply to affected area once 10/25/23  Yes [name], Martinez  lubiprostone  (AMITIZA ) 8 MCG capsule Take 1 capsule (8 mcg total) by mouth 3 (three) times daily. 10/17/23  Yes Bedsole, Amy E, Martinez  magic mouthwash w/lidocaine  SOLN Take 5 mLs by mouth 4 (four) times daily as needed for mouth pain. swish and swallow 11/07/23  Yes Borders, Fonda SAUNDERS, NP  ondansetron  (ZOFRAN ) 8 MG tablet Take 1 tablet (8 mg total) by mouth every 8 (eight) hours as needed for nausea or vomiting. 10/25/23  Yes [name], Martinez  Oxycodone  HCl 20 MG TABS Take 1 tablet by mouth every 4 (four) hours as needed (Pain).   Yes Provider, Historical, Martinez  pantoprazole  (PROTONIX ) 40 MG tablet Take 1 tablet (40 mg total) by mouth daily. 11/17/20  Yes Schnier, Cordella MATSU, Martinez  polyethylene glycol (MIRALAX  / GLYCOLAX ) 17 g  packet Take 17 g by mouth daily as needed for mild constipation. 08/25/23  Yes Barbarann Nest, Martinez  potassium chloride  SA (KLOR-CON  M) 20 MEQ tablet Take 1 tablet (20 mEq total) by mouth daily.  11/27/23  Yes Hackney, Ellouise A, FNP  pregabalin  (LYRICA ) 200 MG capsule Take 1 capsule (200 mg total) by mouth in the morning, at noon, and at bedtime. 11/24/21  Yes Bedsole, Amy E, Martinez  prochlorperazine  (COMPAZINE ) 10 MG tablet Take 1 tablet (10 mg total) by mouth every 6 (six) hours as needed for nausea or vomiting. 10/25/23  Yes [name], Martinez  Semaglutide , 2 MG/DOSE, (OZEMPIC , 2 MG/DOSE,) 8 MG/3ML SOPN Inject 2 mg into the skin once a week. Monday 09/01/23  Yes Bedsole, Amy E, Martinez  spironolactone  (ALDACTONE ) 25 MG tablet Take 1 tablet (25 mg total) by mouth daily. 08/28/23 11/26/24 Yes Donette Ellouise A, FNP  tadalafil (CIALIS) 20 MG tablet Take 20 mg by mouth daily as needed for erectile dysfunction.   Yes Provider, Historical, Martinez  terbinafine  (LAMISIL ) 250 MG tablet TAKE 1 TABLET BY MOUTH ONCE DAILY 10/16/23  Yes Janit Thresa HERO, DPM  torsemide  (DEMADEX ) 20 MG tablet Take 3 tablets (60 mg total) by mouth daily. 11/27/23  Yes Donette Ellouise A, FNP  triamcinolone  cream (KENALOG ) 0.5 % Apply 1 Application topically 2 (two) times daily. 10/24/23  Yes Bedsole, Amy E, Martinez  zolpidem  (AMBIEN ) 10 MG tablet Take 1 tablet (10 mg total) by mouth at bedtime. 11/17/23  Yes Bedsole, Amy E, Martinez  Continuous Glucose Sensor (FREESTYLE LIBRE 3 PLUS SENSOR) MISC CHANCE SENSOR EVERY 15 DAYS 08/02/23   Erik Greig BRAVO, Martinez    Scheduled Meds:  amLODipine   5 mg Oral Daily   atorvastatin   20 mg Oral QHS   cyclobenzaprine   10 mg Oral TID   diazepam   5 mg Oral QHS   empagliflozin   25 mg Oral Daily   enoxaparin  (LOVENOX ) injection  40 mg Subcutaneous Q24H   fentaNYL   1 patch Transdermal Q3 days   furosemide   60 mg Intravenous Q12H   iron  polysaccharides  150 mg Oral Daily   lidocaine   3 patch Transdermal Q24H   lubiprostone   8 mcg Oral  TID   pantoprazole   40 mg Oral Daily   potassium chloride  SA  20 mEq Oral Daily   pregabalin   200 mg Oral TID   spironolactone   25 mg Oral Daily   zolpidem   10 mg Oral QHS   Continuous Infusions:  PRN Meds: acetaminophen  **OR** acetaminophen , magic mouthwash w/lidocaine , ondansetron  **OR** ondansetron  (ZOFRAN ) IV, oxyCODONE , polyethylene glycol, prochlorperazine , traZODone  Allergies:    Allergies  Allergen Reactions   Suboxone [Buprenorphine Hcl-Naloxone  Hcl] Nausea And Vomiting   Tape Other (See Comments)    Whelps *Paper Tape is ok    Codeine Rash, Itching and Nausea Only   Morphine  And Codeine Itching and Rash   Other Rash    Telemetry electrodes   Silicone Rash    Whelps -*Paper Tape is ok     Social History:   Social History   Socioeconomic History   Marital status: Divorced    Spouse name: Not on file   Number of children: 3   Years of education: Not on file   Highest education level: Some college, no degree  Occupational History   Not on file  Tobacco Use   Smoking status: Never   Smokeless tobacco: Never  Vaping Use   Vaping status: Never Used  Substance and Sexual Activity   Alcohol use: Not Currently    Alcohol/week: 0.0 - 2.0 standard drinks of alcohol   Drug use: Not Currently    Types: Cocaine    Comment: last + cocaine UDS  07/2016   Sexual activity: Not Currently    Birth control/protection: None  Other Topics Concern   Not on file  Social History Narrative    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   Overall Financial Resource Strain (CARDIA)    Difficulty of Paying Living Expenses: Not hard at all  Food Insecurity: No Food Insecurity (09/26/2023)   Hunger Vital Sign    Worried About Running Out of Food in the Last Year: Never true    Ran Out of Food in the Last Year: Never true  Recent Concern: Food Insecurity - Food  Insecurity Present (07/11/2023)   [facility] from [facility]   Hunger Vital Sign    Within the past 12 months, you worried that your food would run out before you got the money to buy more.: Sometimes true    Within the past 12 months, the food you bought just didn't last and you didn't have money to get more.: Never true  Transportation Needs: No Transportation Needs (09/26/2023)   PRAPARE - Administrator, Civil Service (Medical): No    Lack of Transportation (Non-Medical): No  Physical Activity: Inactive (01/28/2022)   Exercise Vital Sign    Days of Exercise per Week: 0 days    Minutes of Exercise per Session: 0 min  Stress: No Stress Concern Present (01/28/2022)   [employer] of Occupational Health - Occupational Stress Questionnaire    Feeling of Stress : Not at all  Social Connections: Moderately Isolated (09/13/2023)   Social Connection and Isolation Panel    Frequency of Communication with Friends and Family: More than three times a week    Frequency of Social Gatherings with Friends and Family: More than three times a week    Attends Religious Services: More than 4 times per year    Active Member of [employer] or Organizations: No    Attends Banker Meetings: Never    Marital Status: Divorced  Catering Manager Violence: Not At Risk (09/26/2023)   Humiliation, Afraid, Rape, and Kick questionnaire    Fear of Current or Ex-Partner: No    Emotionally Abused: No    Physically Abused: No    Sexually Abused: No    Family History:    Family History  Problem Relation Age of Onset   Diabetes Mother    Arthritis Mother    Cancer Mother        lung cancer   Hyperlipidemia Mother    Stroke Mother    Alcohol abuse Father    Cancer Father        lung   Arthritis Father    Diabetes Brother      ROS:  Please see the history of present illness.   All other ROS reviewed and negative.     Physical Exam/Data: Vitals:   12/09/23 2042  12/09/23 2230 12/10/23 0241 12/10/23 0500  BP:  (!) 140/75 104/61 112/60  Pulse:  96 78 94  Resp:  (!) 21 14 17   Temp:  98.4 F (36.9 C) 97.9 F (36.6 C)   TempSrc:  Oral Oral   SpO2: 100% 100% 100% 99%  Height:        Intake/Output Summary (Last 24 hours) at 12/10/2023 0707 Last data filed at 12/10/2023 0233 Gross per 24 hour  Intake 58.92 ml  Output 5800 ml  Net -5741.08 ml      11/27/2023   10:46 AM 11/22/2023    3:02 PM 11/14/2023   12:02 PM  Last 3 Weights  Weight (lbs) 184 lb 2 oz 190 lb 6.4 oz 173 lb 8 oz  Weight (kg) 83.519 kg 86.365 kg 78.699 kg     Body mass index is 27.19 kg/m.  General:  Well nourished, well developed, in no acute distress HEENT: normal Neck: + JVD Vascular: No carotid bruits; Distal pulses 2+ bilaterally Cardiac:  normal S1, S2; RRR; no murmur  Lungs:  clear to auscultation bilaterally, no wheezing, rhonchi or rales  Abd: soft, nontender, no hepatomegaly  Ext: moderate lower leg edema Musculoskeletal:  No deformities, BUE and BLE strength normal and equal Skin: warm and dry  Neuro:  CNs 2-12 intact, no focal abnormalities noted Psych:  Normal affect   EKG:  The EKG was personally reviewed and demonstrates:  NSR 100bpm, PVC, nonspecific ST/T wave changes Telemetry:  Telemetry was personally reviewed and demonstrates:  NSR PVCs, HR 70-90  Relevant CV Studies:  Echo 08/2023  1. Left ventricular ejection fraction, by estimation, is 50 to 55%. The  left ventricle has low normal function. The left ventricle has no regional  wall motion abnormalities. Left ventricular diastolic parameters are  consistent with Grade I diastolic  dysfunction (impaired relaxation).   2. Right ventricular systolic function is normal. The right ventricular  size is normal. There is normal pulmonary artery systolic pressure. The  estimated right ventricular systolic pressure is 12.4 mmHg.   3. Left atrial size was mildly dilated.   4. The mitral valve is normal  in structure. Mild to moderate mitral valve  regurgitation. No evidence of mitral stenosis.   5. The aortic valve is normal in structure. There is mild calcification  of the aortic valve. Aortic valve regurgitation is not visualized. Aortic  valve sclerosis/calcification is present, without any evidence of aortic  stenosis.   6. The inferior vena cava is normal in size with greater than 50%  respiratory variability, suggesting right atrial pressure of 3 mmHg.   Laboratory Data: High Sensitivity Troponin:  No results for input(s): TROPONINIHS in the last 720 hours.   Chemistry Recent Labs  Lab 12/09/23 1848 12/10/23 0212  NA 134* 135  K 3.9 3.4*  CL 98 99  CO2 25 27  GLUCOSE 141* 242*  BUN 11 11  CREATININE 0.88 0.89  CALCIUM  8.8* 8.1*  GFRNONAA >60 >60  ANIONGAP 11 10    No results for input(s): PROT, ALBUMIN, AST, ALT, ALKPHOS, BILITOT in the last 168 hours. Lipids No results for input(s): CHOL, TRIG, HDL, LABVLDL, LDLCALC, CHOLHDL in the last 168 hours.  Hematology Recent Labs  Lab 12/09/23 1848 12/10/23 0538  WBC 9.9 6.5  RBC 4.13* 3.68*  HGB 8.4* 7.5*  HCT 28.3* 25.2*  MCV 68.5* 68.5*  MCH 20.3* 20.4*  MCHC 29.7* 29.8*  RDW 19.7* 19.4*  PLT 297 262   Thyroid  No results for input(s): TSH, FREET4 in the last 168 hours.  BNP Recent Labs  Lab 12/09/23 1848  PROBNP 10,855.0*    DDimer No results for input(s): DDIMER in the last 168 hours.  Radiology/Studies:  DG Chest 2 View Result Date: 12/09/2023 EXAM: 2 VIEW(S) XRAY OF THE CHEST 12/09/2023 07:09:00 PM COMPARISON: 10/27/2023 CLINICAL HISTORY: SOB FINDINGS: LINES, TUBES AND DEVICES: Right chest port in place. LUNGS AND PLEURA: Pulmonary interstitial prominence worsen compared to 10 / 17 / 25. Patchy bibasilar airspace opacities.  Small bilateral pleural effusions. No pneumothorax. HEART AND MEDIASTINUM: Cardiomegaly. Calcified aorta. BONES AND SOFT TISSUES: No acute osseous  abnormality. IMPRESSION: 1. Interstitial and alveolar edema with small bilateral pleural effusions. Electronically signed by: Norman Gatlin Martinez 12/09/2023 07:14 PM EST RP Workstation: [id number]     Assessment and Plan:  Acute diastolic heart failure - Patient presented with worsening shortness of breath and lower leg edema after being switched from Lasix  to torsemide . Patient is unsure of the dose, he feels it was not working as well as lasix  - BNP greater than 10,000 - Started on IV Lasix  drip> switch to IV Lasix  60 mg twice daily - PTA Jardiance  25 mg daily, spironolactone  25 mg daily, torsemide   - Net -5.7 L - kidney function is stable - continue diuresis  Hypertension - Blood pressure normal - Amlodipine  5 mg daily, spironolactone  25 mg daily  Dyslipidemia - LDL 54 - Lipitor 20 mg daily  Anemia - Hgb 8.4>7.5 - consider transfusion, per IM  For questions or updates, please contact [facility] Please consult [email or web address] for contact info under      Signed, Channell Quattrone VEAR Fishman, PA-C  12/10/2023 7:07 AM

## 2023-12-10 NOTE — ED Notes (Signed)
 Pt given ginger ale, peanut butter, crackers, and a warm blanket.

## 2023-12-10 NOTE — Progress Notes (Signed)
 PROGRESS NOTE    [name]  FMW:[id number] DOB: [birth year] DOA: 12/09/2023 PCP: [name], MD  Chief Complaint  Patient presents with   Shortness of Breath    Hospital Course:  Erik Martinez is a 68 year old male with stage II colon cancer, chronic pain, hepatitis, bipolar disorder, heart failure with preserved EF, osteoarthritis, GERD, type 2 diabetes, peripheral vascular disease, who presents to the ED with worsening dyspnea, lower extremity edema, and orthopnea.  He was recently seen in CHF clinic and torsemide  was increased at that time.  On admission proBNP was over 10,000, CBC with microcytic anemia, abdominal pelvic CT with circumferential narrowing of the colon at hepatic flexure consistent with primary colonic neoplasm with no evidence of metastatic adenopathy or liver mets.  He was admitted and started on IV diuresis  Subjective: Patient reports he is starting to feel better.  Does endorse significant urinary output.  Denies any chest pain.   Objective: Vitals:   12/10/23 0800 12/10/23 1130 12/10/23 1203 12/10/23 1454  BP: 126/68   122/69  Pulse: 95 97  88  Resp: 18 16  18   Temp:   98.6 F (37 C) (!) 97.4 F (36.3 C)  TempSrc:   Oral Oral  SpO2: 100% 100%  100%  Height:        Intake/Output Summary (Last 24 hours) at 12/10/2023 1621 Last data filed at 12/10/2023 0233 Gross per 24 hour  Intake 58.92 ml  Output 5800 ml  Net -5741.08 ml   There were no vitals filed for this visit.  Examination: General exam: Appears calm and comfortable, NAD  Respiratory system: No work of breathing, symmetric chest wall expansion Cardiovascular system: S1 & S2 heard, RRR.  Gastrointestinal system: Abdomen is nondistended, soft and nontender.  Neuro: Alert and oriented. No focal neurological deficits. Extremities: Symmetric, 2+ pitting edema bilaterally Skin: Tattoos Psychiatry: Demonstrates appropriate judgement and insight. Mood & affect appropriate for situation.    Assessment & Plan:  Principal Problem:   Acute on chronic diastolic CHF (congestive heart failure) (HCC) Active Problems:   Essential hypertension   Dyslipidemia   GERD without esophagitis   IBS (irritable bowel syndrome)    Acute on chronic diastolic CHF Alveolar edema, pleural effusions - proBNP greater than 10,000 - Has had almost 6 L output so far - Continue with IV diuresis - Monitor strict I's and O's - Monitor kidney function - Echocardiogram 50 to 55%, grade 1 diastolic dysfunction, - Pending outpatient cardiac PET - Continue with Jardiance  and spironolactone  home meds  Stage II colon cancer - Status post surgical resection.  No positive lymph nodes.  Currently undergoing chemotherapy, has had 1 session so far anticipated 2 of 12 total - Continue outpatient follow-up with Dr. Melanee  Acute on chronic anemia - Secondary to chemotherapy and active malignancy - Monitor hemoglobin closely, may require blood transfusion this admission  Hypertension - Continue home meds  Dyslipidemia - Continue home meds  Chronic pain - Continue home medications. - Outpatient on oxycodone , fentanyl  patch and pregabalin .  Additionally receiving prescriptions for zolpidem  and diazepam .  PDMP reviewed. - Mix of benzodiazepines and opioids increases his risk of respiratory depression.  Will discuss with him  Body mass index is 27.19 kg/m. - Outpatient follow up for lifestyle modification and risk factor management  Hypokalemia - Continue to replace  Bipolar disorder - Taking Valium  outpatient.  Type 2 diabetes - Most recent hemoglobin A1c October 7.5% - On Ozempic   and insulin . - Continue  with sliding scale while admitted  DVT prophylaxis: Discontinue Lovenox  given downtrending hemoglobin.  SCDs unlikely to be helpful given profound pitting edema.  Encourage ambulation.  PT ordered   Code Status: Full Code Disposition: Pending Clinical resolution  Consultants:  Treatment  Team:  Consulting Physician: Darliss Rogue, MD  Procedures:    Antimicrobials:  Anti-infectives (From admission, onward)    None       Data Reviewed: I have personally reviewed following labs and imaging studies CBC: Recent Labs  Lab 12/09/23 1848 12/10/23 0538  WBC 9.9 6.5  HGB 8.4* 7.5*  HCT 28.3* 25.2*  MCV 68.5* 68.5*  PLT 297 262   Basic Metabolic Panel: Recent Labs  Lab 12/09/23 1848 12/10/23 0212  NA 134* 135  K 3.9 3.4*  CL 98 99  CO2 25 27  GLUCOSE 141* 242*  BUN 11 11  CREATININE 0.88 0.89  CALCIUM  8.8* 8.1*   GFR: Estimated Creatinine Clearance: 80.5 mL/min (by C-G formula based on SCr of 0.89 mg/dL). Liver Function Tests: No results for input(s): AST, ALT, ALKPHOS, BILITOT, PROT, ALBUMIN in the last 168 hours. CBG: No results for input(s): GLUCAP in the last 168 hours.  No results found for this or any previous visit (from the past 240 hours).   Radiology Studies: DG Chest 2 View Result Date: 12/09/2023 EXAM: 2 VIEW(S) XRAY OF THE CHEST 12/09/2023 07:09:00 PM COMPARISON: 10/27/2023 CLINICAL HISTORY: SOB FINDINGS: LINES, TUBES AND DEVICES: Right chest port in place. LUNGS AND PLEURA: Pulmonary interstitial prominence worsen compared to 10 / 17 / 25. Patchy bibasilar airspace opacities. Small bilateral pleural effusions. No pneumothorax. HEART AND MEDIASTINUM: Cardiomegaly. Calcified aorta. BONES AND SOFT TISSUES: No acute osseous abnormality. IMPRESSION: 1. Interstitial and alveolar edema with small bilateral pleural effusions. Electronically signed by: Norman Gatlin MD 12/09/2023 07:14 PM EST RP Workstation: [id number]    Scheduled Meds:  amLODipine   5 mg Oral Daily   atorvastatin   20 mg Oral QHS   cyclobenzaprine   10 mg Oral TID   diazepam   5 mg Oral QHS   empagliflozin   25 mg Oral Daily   enoxaparin  (LOVENOX ) injection  40 mg Subcutaneous Q24H   fentaNYL   1 patch Transdermal Q3 days   furosemide   60 mg Intravenous Q12H    iron  polysaccharides  150 mg Oral Daily   lidocaine   3 patch Transdermal Q24H   lubiprostone   8 mcg Oral TID   pantoprazole   40 mg Oral Daily   pregabalin   200 mg Oral TID   spironolactone   25 mg Oral Daily   zolpidem   10 mg Oral QHS   Continuous Infusions:   LOS: 1 day  MDM: Patient is high risk for one or more organ failure.  They necessitate ongoing hospitalization for continued IV therapies and subsequent lab monitoring. Total time spent interpreting labs and vitals, reviewing the medical record, coordinating care amongst consultants and care team members, directly assessing and discussing care with the patient and/or family: 55 min  Shaheed Schmuck, DO [facility]  To contact the attending physician between 7A-7P please use Epic Chat. To contact the covering physician during after hours 7P-7A, please review Amion.  12/10/2023, 4:21 PM   *This document has been created with the assistance of dictation software. Please excuse typographical errors. *

## 2023-12-11 DIAGNOSIS — K219 Gastro-esophageal reflux disease without esophagitis: Secondary | ICD-10-CM | POA: Diagnosis not present

## 2023-12-11 DIAGNOSIS — I5033 Acute on chronic diastolic (congestive) heart failure: Secondary | ICD-10-CM | POA: Diagnosis not present

## 2023-12-11 DIAGNOSIS — I10 Essential (primary) hypertension: Secondary | ICD-10-CM | POA: Diagnosis not present

## 2023-12-11 DIAGNOSIS — E785 Hyperlipidemia, unspecified: Secondary | ICD-10-CM | POA: Diagnosis not present

## 2023-12-11 LAB — COMPREHENSIVE METABOLIC PANEL WITH GFR
ALT: 8 U/L (ref 0–44)
AST: 17 U/L (ref 15–41)
Albumin: 3.6 g/dL (ref 3.5–5.0)
Alkaline Phosphatase: 130 U/L — ABNORMAL HIGH (ref 38–126)
Anion gap: 11 (ref 5–15)
BUN: 18 mg/dL (ref 8–23)
CO2: 27 mmol/L (ref 22–32)
Calcium: 8.1 mg/dL — ABNORMAL LOW (ref 8.9–10.3)
Chloride: 96 mmol/L — ABNORMAL LOW (ref 98–111)
Creatinine, Ser: 1.31 mg/dL — ABNORMAL HIGH (ref 0.61–1.24)
GFR, Estimated: 60 mL/min — ABNORMAL LOW (ref >60)
Glucose, Bld: 223 mg/dL — ABNORMAL HIGH (ref 70–99)
Potassium: 4.2 mmol/L (ref 3.5–5.1)
Sodium: 135 mmol/L (ref 135–145)
Total Bilirubin: 0.5 mg/dL (ref 0.0–1.2)
Total Protein: 7.7 g/dL (ref 6.5–8.1)

## 2023-12-11 LAB — CBC WITH DIFFERENTIAL/PLATELET
Abs Immature Granulocytes: 0.03 K/uL (ref 0.00–0.07)
Basophils Absolute: 0.1 K/uL (ref 0.0–0.1)
Basophils Relative: 1 %
Eosinophils Absolute: 0.2 K/uL (ref 0.0–0.5)
Eosinophils Relative: 3 %
HCT: 29 % — ABNORMAL LOW (ref 39.0–52.0)
Hemoglobin: 8.7 g/dL — ABNORMAL LOW (ref 13.0–17.0)
Immature Granulocytes: 0 %
Lymphocytes Relative: 22 %
Lymphs Abs: 1.7 K/uL (ref 0.7–4.0)
MCH: 20.3 pg — ABNORMAL LOW (ref 26.0–34.0)
MCHC: 30 g/dL (ref 30.0–36.0)
MCV: 67.8 fL — ABNORMAL LOW (ref 80.0–100.0)
Monocytes Absolute: 0.8 K/uL (ref 0.1–1.0)
Monocytes Relative: 11 %
Neutro Abs: 4.8 K/uL (ref 1.7–7.7)
Neutrophils Relative %: 63 %
Platelets: 309 K/uL (ref 150–400)
RBC: 4.28 MIL/uL (ref 4.22–5.81)
RDW: 19.8 % — ABNORMAL HIGH (ref 11.5–15.5)
Smear Review: NORMAL
WBC: 7.6 K/uL (ref 4.0–10.5)
nRBC: 0 % (ref 0.0–0.2)

## 2023-12-11 LAB — MAGNESIUM: Magnesium: 2.2 mg/dL (ref 1.7–2.4)

## 2023-12-11 LAB — PHOSPHORUS: Phosphorus: 3.1 mg/dL (ref 2.5–4.6)

## 2023-12-11 LAB — MRSA NEXT GEN BY PCR, NASAL: MRSA by PCR Next Gen: DETECTED — AB

## 2023-12-11 LAB — GLUCOSE, CAPILLARY
Glucose-Capillary: 206 mg/dL — ABNORMAL HIGH (ref 70–99)
Glucose-Capillary: 189 mg/dL — ABNORMAL HIGH (ref 70–99)
Glucose-Capillary: 214 mg/dL — ABNORMAL HIGH (ref 70–99)
Glucose-Capillary: 163 mg/dL — ABNORMAL HIGH (ref 70–99)

## 2023-12-11 MED ORDER — FUROSEMIDE 10 MG/ML IJ SOLN
20.0000 mg | Freq: Once | INTRAMUSCULAR | Status: AC
  Administered 2023-12-11: 20 mg via INTRAVENOUS
  Filled 2023-12-11: qty 2

## 2023-12-11 MED ORDER — CHLORHEXIDINE GLUCONATE CLOTH 2 % EX PADS
6.0000 | MEDICATED_PAD | Freq: Every day | CUTANEOUS | Status: DC
  Administered 2023-12-11 – 2023-12-12 (×2): 6 via TOPICAL

## 2023-12-11 MED ORDER — MUPIROCIN 2 % EX OINT
1.0000 | TOPICAL_OINTMENT | Freq: Two times a day (BID) | CUTANEOUS | Status: DC
  Administered 2023-12-11 – 2023-12-12 (×3): 1 via NASAL
  Filled 2023-12-11: qty 22

## 2023-12-11 NOTE — Progress Notes (Addendum)
 Heart Failure Navigator Progress Note  Current Advanced Heart Failure Team patient of Ellouise Class, FNP.  He has a scheduled follow-up on 12/17/202 @ 11:30 AM. Patient aware of hospital follow-up details @ the [facility].  Navigator will sign off at this time.  Charmaine Pines, RN, BSN [facility] Heart Failure Navigator Secure Chat Only

## 2023-12-11 NOTE — Inpatient Diabetes Management (Signed)
 Inpatient Diabetes Program Recommendations  AACE/ADA: New Consensus Statement on Inpatient Glycemic Control (2015)  Target Ranges:  Prepandial:   less than 140 mg/dL      Peak postprandial:   less than 180 mg/dL (1-2 hours)      Critically ill patients:  140 - 180 mg/dL   Lab Results  Component Value Date   GLUCAP 206 (H) 12/11/2023   HGBA1C 7.5 (A) 10/24/2023    Latest Reference Range & Units 12/10/23 20:48 12/11/23 07:45  Glucose-Capillary 70 - 99 mg/dL 733 (H) 793 (H)  (H): Data is abnormally high  Diabetes history: DM2 Outpatient Diabetes medications:  Glucotrol  5 mg daily Current orders for Inpatient glycemic control: Novolog  0-20 units tid, 0-5 units hs Glucotrol  5 mg daily Farxiga 25 mg daily Prednisone  40 mg daily  Inpatient Diabetes Program Recommendations:   Please consider while in the hospital on steroids: -Add Semglee 12 units daily (0.15 units/kg x 84 kg)  Thank you, Dagoberto E. Ellicia Alix, RN, MSN, CNS, CDCES  Diabetes Coordinator Inpatient Glycemic Control Team Team Pager [phone] (8am-5pm) 12/11/2023 10:15 AM

## 2023-12-11 NOTE — Progress Notes (Signed)
 PROGRESS NOTE    Erik Martinez  FMW:[id number] DOB: 01-Dec-1955 DOA: 12/09/2023 PCP: [name], MD  Chief Complaint  Patient presents with   Shortness of Breath    Hospital Course:  Erik Martinez is a 68 year old male with stage II colon cancer, chronic pain, hepatitis, bipolar disorder, heart failure with preserved EF, osteoarthritis, GERD, type 2 diabetes, peripheral vascular disease, who presents to the ED with worsening dyspnea, lower extremity edema, and orthopnea.  He was recently seen in CHF clinic and torsemide  was increased at that time.  On admission proBNP was over 10,000, CBC with microcytic anemia, abdominal pelvic CT with circumferential narrowing of the colon at hepatic flexure consistent with primary colonic neoplasm with no evidence of metastatic adenopathy or liver mets.  He was admitted and started on IV diuresis  Subjective: Patient reports significant improvement.  He was able to walk around with physical therapy without issue this morning.   Objective: Vitals:   12/11/23 0344 12/11/23 0742 12/11/23 1138 12/11/23 1427  BP: 123/88 120/71 116/70 109/70  Pulse: (!) 109 94 96 94  Resp: 18 18 16 15   Temp: 98.2 F (36.8 C) 98.2 F (36.8 C) 98.2 F (36.8 C) 98.4 F (36.9 C)  TempSrc: Oral Oral Oral Oral  SpO2: 97% 98% 96% 99%  Weight: 84 kg     Height:        Intake/Output Summary (Last 24 hours) at 12/11/2023 1518 Last data filed at 12/11/2023 1032 Gross per 24 hour  Intake 1680 ml  Output 3400 ml  Net -1720 ml   Filed Weights   12/11/23 0248 12/11/23 0344  Weight: 82.6 kg 84 kg    Examination: General exam: Appears calm and comfortable, NAD  Respiratory system: No work of breathing, symmetric chest wall expansion Cardiovascular system: S1 & S2 heard, RRR.  Gastrointestinal system: Abdomen is nondistended, soft and nontender.  Neuro: Alert and oriented. No focal neurological deficits. Extremities: Symmetric, 1+ pitting edema bilaterally Skin:  Tattoos Psychiatry: Demonstrates appropriate judgement and insight. Mood & affect appropriate for situation.   Assessment & Plan:  Principal Problem:   Acute on chronic diastolic CHF (congestive heart failure) (HCC) Active Problems:   Essential hypertension   Dyslipidemia   GERD without esophagitis   IBS (irritable bowel syndrome)    Acute on chronic diastolic CHF Alveolar edema, pleural effusions - proBNP greater than 10,000 - Has had significant volume output, over 8 L since admission - Appears to be getting dry with AKI today - Lasix  stopped for now.  Can resume p.o. dosing tomorrow if kidney function improves - Continue to monitor strict I's and O's - Continue monitor kidney function closely - Echocardiogram 50 to 55%, grade 1 diastolic dysfunction, - Pending outpatient cardiac PET - Continue with Jardiance  and spironolactone  home meds  AKI - Baseline creatinine 0.88, has risen to 1.31. - Likely secondary to Lasix .  Have pause Lasix  will continue to monitor response closely.  Stage II colon cancer - Status post surgical resection.  No positive lymph nodes.  Currently undergoing chemotherapy, has had 1 session so far anticipated 2 of 12 total - Continue outpatient follow-up with Dr. Melanee  Acute on chronic anemia - Secondary to chemotherapy and active malignancy - Monitor hemoglobin closely, may require blood transfusion this admission - Hemoglobin is rising today.  No acute bleeding. No indication for transfusion for now  Hypertension - Continue home meds  Dyslipidemia - Continue home meds  Chronic pain - Continue home medications. -  Outpatient on oxycodone , fentanyl  patch and pregabalin .  Additionally receiving prescriptions for zolpidem  and diazepam .  PDMP reviewed. - Mix of benzodiazepines and opioids increases his risk of respiratory depression.  Will discuss with him  Body mass index is 27.33 kg/m. - Outpatient follow up for lifestyle modification and risk  factor management  Hypokalemia - Continue to replace  Bipolar disorder - Taking Valium  outpatient.  Type 2 diabetes - Most recent hemoglobin A1c October 7.5% - On Ozempic  and insulin . - Continue with sliding scale while admitted  DVT prophylaxis: Discontinue Lovenox  given downtrending hemoglobin.  SCDs unlikely to be helpful given profound pitting edema.  Encourage ambulation.  PT ordered   Code Status: Full Code Disposition: Pending Clinical resolution  Consultants:  Treatment Team:  Consulting Physician: Darliss Rogue, MD  Procedures:    Antimicrobials:  Anti-infectives (From admission, onward)    None       Data Reviewed: I have personally reviewed following labs and imaging studies CBC: Recent Labs  Lab 12/09/23 1848 12/10/23 0538 12/11/23 0400  WBC 9.9 6.5 7.6  NEUTROABS  --   --  4.8  HGB 8.4* 7.5* 8.7*  HCT 28.3* 25.2* 29.0*  MCV 68.5* 68.5* 67.8*  PLT 297 262 309   Basic Metabolic Panel: Recent Labs  Lab 12/09/23 1848 12/10/23 0212 12/11/23 0400  NA 134* 135 135  K 3.9 3.4* 4.2  CL 98 99 96*  CO2 25 27 27   GLUCOSE 141* 242* 223*  BUN 11 11 18   CREATININE 0.88 0.89 1.31*  CALCIUM  8.8* 8.1* 8.1*  MG  --   --  2.2  PHOS  --   --  3.1   GFR: Estimated Creatinine Clearance: 54.7 mL/min (A) (by C-G formula based on SCr of 1.31 mg/dL (H)). Liver Function Tests: Recent Labs  Lab 12/11/23 0400  AST 17  ALT 8  ALKPHOS 130*  BILITOT 0.5  PROT 7.7  ALBUMIN 3.6   CBG: Recent Labs  Lab 12/10/23 2048 12/11/23 0745 12/11/23 1139  GLUCAP 266* 206* 189*    Recent Results (from the past 240 hours)  MRSA Next Gen by PCR, Nasal     Status: Abnormal   Collection Time: 12/11/23  2:58 AM   Specimen: Nasal Mucosa; Nasal Swab  Result Value Ref Range Status   MRSA by PCR Next Gen DETECTED (A) NOT DETECTED Final    Comment: RESULT CALLED TO, READ BACK BY AND VERIFIED WITH: DANIA CRUTCH, RN @0443  12/11/2023 COP (NOTE) The GeneXpert  MRSA Assay (FDA approved for NASAL specimens only), is one component of a comprehensive MRSA colonization surveillance program. It is not intended to diagnose MRSA infection nor to guide or monitor treatment for MRSA infections. Test performance is not FDA approved in patients less than 16 years old. Performed at [facility], [address] [place], [place] [address]      Radiology Studies: DG Chest 2 View Result Date: 12/09/2023 EXAM: 2 VIEW(S) XRAY OF THE CHEST 12/09/2023 07:09:00 PM COMPARISON: 10/27/2023 CLINICAL HISTORY: SOB FINDINGS: LINES, TUBES AND DEVICES: Right chest port in place. LUNGS AND PLEURA: Pulmonary interstitial prominence worsen compared to 10 / 17 / 25. Patchy bibasilar airspace opacities. Small bilateral pleural effusions. No pneumothorax. HEART AND MEDIASTINUM: Cardiomegaly. Calcified aorta. BONES AND SOFT TISSUES: No acute osseous abnormality. IMPRESSION: 1. Interstitial and alveolar edema with small bilateral pleural effusions. Electronically signed by: Norman Gatlin MD 12/09/2023 07:14 PM EST RP Workstation: [id number]    Scheduled Meds:  amLODipine   5 mg Oral  Daily   atorvastatin   20 mg Oral QHS   Chlorhexidine  Gluconate Cloth  6 each Topical Daily   Chlorhexidine  Gluconate Cloth  6 each Topical Daily   cyclobenzaprine   10 mg Oral TID   diazepam   5 mg Oral QHS   empagliflozin   25 mg Oral Daily   fentaNYL   1 patch Transdermal Q3 days   insulin  aspart  0-15 Units Subcutaneous TID WC   insulin  aspart  0-5 Units Subcutaneous QHS   iron  polysaccharides  150 mg Oral Daily   lidocaine   3 patch Transdermal Q24H   lubiprostone   8 mcg Oral TID   mupirocin  ointment  1 Application Nasal BID   pantoprazole   40 mg Oral Daily   pregabalin   200 mg Oral TID   sodium chloride  flush  10-40 mL Intracatheter Q12H   spironolactone   25 mg Oral Daily   zolpidem   10 mg Oral QHS   Continuous Infusions:   LOS: 2 days  MDM: Patient is high risk for one or more  organ failure.  They necessitate ongoing hospitalization for continued IV therapies and subsequent lab monitoring. Total time spent interpreting labs and vitals, reviewing the medical record, coordinating care amongst consultants and care team members, directly assessing and discussing care with the patient and/or family: 55 min  Ladoris Lythgoe, DO [facility]  To contact the attending physician between 7A-7P please use Epic Chat. To contact the covering physician during after hours 7P-7A, please review Amion.  12/11/2023, 3:18 PM   *This document has been created with the assistance of dictation software. Please excuse typographical errors. *

## 2023-12-11 NOTE — Evaluation (Signed)
 Physical Therapy Evaluation Patient Details Name: Erik Martinez MRN: [id number] DOB: 1955/02/16 Today's Date: 12/11/2023  History of Present Illness  Pt is a 68 y.o. male with medical history significant for diastolic CHF, osteoarthritis, bipolar disorder, GERD, hepatitis B and C, dyslipidemia, type 2 diabetes mellitus and peripheral vascular disease. MD assessment includes: acute on chronic diastolic CHF.  Clinical Impression  Pt was walking around room on arrival, was pleasant and motivated to participate during the session and put forth good effort throughout. Pt ambulated approx 250 ft independently without AD including with head turns and start/stops with good stability throughout. Pt did report some discomfort in feet due to swelling from the previous day. Pt was steady on feet, a slight decrease in cadence, but no LOB throughout. Pts HR and SpO2 WNL throughout. No skilled PT needs identified at this time with pt in agreement that he does not require PT intervention. Will complete PT orders at this time but will reassess pt pending a change in status upon receipt of new PT orders, MD notified.         If plan is discharge home, recommend the following:     Can travel by private vehicle        Equipment Recommendations  None recommended  Recommendations for Other Services       Functional Status Assessment Patient has had a recent decline in their functional status and demonstrates the ability to make significant improvements in function in a reasonable and predictable amount of time.     Precautions / Restrictions Precautions Precautions: Fall Recall of Precautions/Restrictions: Intact Restrictions Weight Bearing Restrictions Per Provider Order: No      Mobility  Bed Mobility Overal bed mobility: Independent                  Transfers Overall transfer level: Independent                      Ambulation/Gait Ambulation/Gait assistance:  Independent Gait Distance (Feet): 250 Feet Assistive device: None Gait Pattern/deviations: WFL(Within Functional Limits) Gait velocity: slightly decreased     General Gait Details: pt reported he is a little slower on his feet today, due to the excessive swelling the say prior -- slight limitation, but overall close to baseline, no LOB, no buckling or drifting  Stairs            Wheelchair Mobility     Tilt Bed    Modified Rankin (Stroke Patients Only)       Balance Overall balance assessment: Independent                                           Pertinent Vitals/Pain Pain Assessment Pain Assessment: 0-10 Pain Score: 0-No pain    Home Living Family/patient expects to be discharged to:: Private residence Living Arrangements: with brother and sister in RV  Available Help at Discharge: Family  Type of Home: Other(Comment) (RV) Home Access: Ramped entrance       Home Layout: One level Home Equipment: None      Prior Function Prior Level of Function : Independent/Modified Independent             Mobility Comments: pt does not require and ADs, 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 ADLs Comments: ind with ADLs     Extremity/Trunk Assessment  Upper Extremity Assessment Upper Extremity Assessment: Defer to OT evaluation         Cervical / Trunk Assessment Cervical / Trunk Assessment: Normal  Communication   Communication Communication: No apparent difficulties    Cognition Arousal: Alert Behavior During Therapy: WFL for tasks assessed/performed   PT - Cognitive impairments: No apparent impairments                         Following commands: Intact       Cueing Cueing Techniques:  (none needed)     General Comments General comments (skin integrity, edema, etc.): modified DGI, approx 250 ft without asssistance, no LOB or unsteadiness, vitals WNL    Exercises     Assessment/Plan    PT  Assessment Patient does not need any further PT services (pt ind during session and does not require further PT services)  PT Problem List         PT Treatment Interventions      PT Goals (Current goals can be found in the Care Plan section)  Acute Rehab PT Goals Patient Stated Goal: get back home and to work PT Goal Formulation: With patient Time For Goal Achievement: 12/24/23 Potential to Achieve Goals: Good    Frequency       Co-evaluation               AM-PAC PT 6 Clicks Mobility  Outcome Measure Help needed turning from your back to your side while in a flat bed without using bedrails?: None Help needed moving from lying on your back to sitting on the side of a flat bed without using bedrails?: None Help needed moving to and from a bed to a chair (including a wheelchair)?: None Help needed standing up from a chair using your arms (e.g., wheelchair or bedside chair)?: None Help needed to walk in hospital room?: None Help needed climbing 3-5 steps with a railing? : None 6 Click Score: 24    End of Session Equipment Utilized During Treatment: Gait belt Activity Tolerance: Patient tolerated treatment well Patient left: in chair;with call bell/phone within reach;Nurse Communication: Mobility status      Time: 1001-1015 PT Time Calculation (min) (ACUTE ONLY): 14 min   Charges:                 Corean Newport, SPT 12/11/23, 12:24 PM

## 2023-12-11 NOTE — Plan of Care (Signed)
  Problem: Education: Goal: Ability to demonstrate management of disease process will improve Outcome: Progressing Goal: Ability to verbalize understanding of medication therapies will improve Outcome: Progressing Goal: Individualized Educational Video(s) Outcome: Progressing   Problem: Activity: Goal: Capacity to carry out activities will improve Outcome: Progressing   Problem: Cardiac: Goal: Ability to achieve and maintain adequate cardiopulmonary perfusion will improve Outcome: Progressing   Problem: Education: Goal: Knowledge of General Education information will improve Description: Including pain rating scale, medication(s)/side effects and non-pharmacologic comfort measures Outcome: Progressing   Problem: Health Behavior/Discharge Planning: Goal: Ability to manage health-related needs will improve Outcome: Progressing   Problem: Clinical Measurements: Goal: Ability to maintain clinical measurements within normal limits will improve Outcome: Progressing Goal: Will remain free from infection Outcome: Progressing Goal: Diagnostic test results will improve Outcome: Progressing Goal: Respiratory complications will improve Outcome: Progressing Goal: Cardiovascular complication will be avoided Outcome: Progressing   Problem: Activity: Goal: Risk for activity intolerance will decrease Outcome: Progressing   Problem: Nutrition: Goal: Adequate nutrition will be maintained Outcome: Progressing   Problem: Pain Managment: Goal: General experience of comfort will improve and/or be controlled Outcome: Progressing   Problem: Safety: Goal: Ability to remain free from injury will improve Outcome: Progressing   Problem: Skin Integrity: Goal: Risk for impaired skin integrity will decrease Outcome: Progressing   Problem: Fluid Volume: Goal: Ability to maintain a balanced intake and output will improve Outcome: Progressing   Problem: Nutritional: Goal: Maintenance of  adequate nutrition will improve Outcome: Progressing Goal: Progress toward achieving an optimal weight will improve Outcome: Progressing

## 2023-12-11 NOTE — Progress Notes (Signed)
 Progress Note  Patient Name: Erik Martinez Date of Encounter: 12/11/2023  Primary Cardiologist: Perla  Subjective   Dyspnea much improved and breathing at baseline. No chest pain. Lower extremity swelling improved. Still with some orthopnea. Documented UOP 480 mL for the past 24 hours, net - 6.2 L for the admission. Weight trend 82.6 to 84 kg. BUN/SCr 11/0.89 to 18/1.31 this morning.   Inpatient Medications    Scheduled Meds:  amLODipine   5 mg Oral Daily   atorvastatin   20 mg Oral QHS   Chlorhexidine  Gluconate Cloth  6 each Topical Daily   Chlorhexidine  Gluconate Cloth  6 each Topical Daily   cyclobenzaprine   10 mg Oral TID   diazepam   5 mg Oral QHS   empagliflozin   25 mg Oral Daily   fentaNYL   1 patch Transdermal Q3 days   furosemide   60 mg Intravenous Q12H   insulin  aspart  0-15 Units Subcutaneous TID WC   insulin  aspart  0-5 Units Subcutaneous QHS   iron  polysaccharides  150 mg Oral Daily   lidocaine   3 patch Transdermal Q24H   lubiprostone   8 mcg Oral TID   mupirocin  ointment  1 Application Nasal BID   pantoprazole   40 mg Oral Daily   pregabalin   200 mg Oral TID   sodium chloride  flush  10-40 mL Intracatheter Q12H   spironolactone   25 mg Oral Daily   zolpidem   10 mg Oral QHS   Continuous Infusions:  PRN Meds: acetaminophen  **OR** acetaminophen , HYDROmorphone  (DILAUDID ) injection, magic mouthwash w/lidocaine , ondansetron  **OR** ondansetron  (ZOFRAN ) IV, mouth rinse, oxyCODONE , polyethylene glycol, prochlorperazine , sodium chloride  flush, traZODone   Vital Signs    Vitals:   12/10/23 2321 12/11/23 0248 12/11/23 0344 12/11/23 0742  BP: (!) 108/51  123/88 120/71  Pulse: 96  (!) 109 94  Resp: 18  18 18   Temp: 98.7 F (37.1 C)  98.2 F (36.8 C) 98.2 F (36.8 C)  TempSrc: Oral  Oral Oral  SpO2: 100%  97% 98%  Weight:  82.6 kg 84 kg   Height:        Intake/Output Summary (Last 24 hours) at 12/11/2023 0819 Last data filed at 12/11/2023 9361 Gross per 24 hour   Intake 1320 ml  Output 1800 ml  Net -480 ml   Filed Weights   12/11/23 0248 12/11/23 0344  Weight: 82.6 kg 84 kg    Telemetry    SR with rates in the 80s to low 100s bpm - Personally Reviewed  ECG    No new tracings - Personally Reviewed  Physical Exam   GEN: No acute distress.   Neck: No JVD. Cardiac: RRR, II/VI systolic murmur RUSB, no rubs, or gallops.  Respiratory: Clear to auscultation bilaterally.  GI: Soft, nontender, non-distended.   MS: Trivial bilateral pretibial edema; No deformity. Neuro:  Alert and oriented x 3; Nonfocal.  Psych: Normal affect.  Labs    Chemistry Recent Labs  Lab 12/09/23 1848 12/10/23 0212 12/11/23 0400  NA 134* 135 135  K 3.9 3.4* 4.2  CL 98 99 96*  CO2 25 27 27   GLUCOSE 141* 242* 223*  BUN 11 11 18   CREATININE 0.88 0.89 1.31*  CALCIUM  8.8* 8.1* 8.1*  PROT  --   --  7.7  ALBUMIN  --   --  3.6  AST  --   --  17  ALT  --   --  8  ALKPHOS  --   --  130*  BILITOT  --   --  0.5  GFRNONAA >60 >60 60*  ANIONGAP 11 10 11      Hematology Recent Labs  Lab 12/09/23 1848 12/10/23 0538 12/11/23 0400  WBC 9.9 6.5 7.6  RBC 4.13* 3.68* 4.28  HGB 8.4* 7.5* 8.7*  HCT 28.3* 25.2* 29.0*  MCV 68.5* 68.5* 67.8*  MCH 20.3* 20.4* 20.3*  MCHC 29.7* 29.8* 30.0  RDW 19.7* 19.4* 19.8*  PLT 297 262 309    Cardiac EnzymesNo results for input(s): TROPONINI in the last 168 hours. No results for input(s): TROPIPOC in the last 168 hours.   BNP Recent Labs  Lab 12/09/23 1848  PROBNP 10,855.0*     DDimer No results for input(s): DDIMER in the last 168 hours.   Radiology    DG Chest 2 View Result Date: 12/09/2023 IMPRESSION: 1. Interstitial and alveolar edema with small bilateral pleural effusions. Electronically signed by: Norman Gatlin MD 12/09/2023 07:14 PM EST RP Workstation: [id number]    Cardiac Studies   2D echo 08/24/2023: 1. Left ventricular ejection fraction, by estimation, is 50 to 55%. The  left ventricle has  low normal function. The left ventricle has no regional  wall motion abnormalities. Left ventricular diastolic parameters are  consistent with Grade I diastolic  dysfunction (impaired relaxation).   2. Right ventricular systolic function is normal. The right ventricular  size is normal. There is normal pulmonary artery systolic pressure. The  estimated right ventricular systolic pressure is 12.4 mmHg.   3. Left atrial size was mildly dilated.   4. The mitral valve is normal in structure. Mild to moderate mitral valve  regurgitation. No evidence of mitral stenosis.   5. The aortic valve is normal in structure. There is mild calcification  of the aortic valve. Aortic valve regurgitation is not visualized. Aortic  valve sclerosis/calcification is present, without any evidence of aortic  stenosis.   6. The inferior vena cava is normal in size with greater than 50%  respiratory variability, suggesting right atrial pressure of 3 mmHg.   Patient Profile     68 y.o. male with history of coronary artery calcification, HFpEF, stage II colon cancer diagnosed in 08/2023 s/p colectomy in 09/2023, microcytic anemia, PVD, DM2, HTN, HLD, bipolar disorder, hepatitis B and C, GERD, OA, and chronic pain syndrome who we are seeing for acute on chronic HFpEF.   Assessment & Plan    1. Acute on chronic HFpEF: - Volume status much improved - Renal function trending up with a SCr trending from 0.89 to 1.31 - Has already received IV Lasix  60 mg this morning, will hold PM dose - Continue Jardiance  and spironolactone   - Feels like he diuresed better on Lasix  over torsemide  and would like to go back on Lasix  at discharge - Anticipate resuming oral diuretic 12/2  2. AKI: - Hold PM dose of Lasix  - Avoid nephrotoxic agents - Trend  3. Coronary artery calcification with elevated high sensitivity troponin: - No angina - High sensitivity troponin mildly elevated and flat trending from 30 to 33 - Not consistent  with ACS - No indication for heparin  gtt with flat trending troponin and underlying anemia - No plans for inpatient invasive ischemic testing at this time - Pending mayocardial PET/CT later this month - Not currently on aspirin  with underlying anemia  4. HTN:  - Blood pressure well controlled - Remains on amlodipine  5 mg and spironolactone  25 mg  5. HLD: - LDL 54 in 07/2023 - PTA Lipitor 20 mg  6. Microcytic anemia: - Maintain Hgb >  8 - Contributing to presentation   7. Hypokalemia: -Repleted - Magnesium  at goal      For questions or updates, please contact [facility] Please consult [email or web address] for contact info under Cardiology/STEMI.    Signed, Bernardino Bring, PA-C [facility] Pager: [phone] 12/11/2023, 8:19 AM

## 2023-12-12 ENCOUNTER — Other Ambulatory Visit: Payer: Self-pay

## 2023-12-12 ENCOUNTER — Encounter: Payer: Self-pay | Admitting: Oncology

## 2023-12-12 DIAGNOSIS — I5033 Acute on chronic diastolic (congestive) heart failure: Secondary | ICD-10-CM | POA: Diagnosis not present

## 2023-12-12 LAB — COMPREHENSIVE METABOLIC PANEL WITH GFR
ALT: 6 U/L (ref 0–44)
AST: 13 U/L — ABNORMAL LOW (ref 15–41)
Albumin: 3.2 g/dL — ABNORMAL LOW (ref 3.5–5.0)
Alkaline Phosphatase: 104 U/L (ref 38–126)
Anion gap: 9 (ref 5–15)
BUN: 23 mg/dL (ref 8–23)
CO2: 29 mmol/L (ref 22–32)
Calcium: 8.2 mg/dL — ABNORMAL LOW (ref 8.9–10.3)
Chloride: 96 mmol/L — ABNORMAL LOW (ref 98–111)
Creatinine, Ser: 0.95 mg/dL (ref 0.61–1.24)
GFR, Estimated: >60 mL/min (ref >60)
Glucose, Bld: 159 mg/dL — ABNORMAL HIGH (ref 70–99)
Potassium: 4 mmol/L (ref 3.5–5.1)
Sodium: 133 mmol/L — ABNORMAL LOW (ref 135–145)
Total Bilirubin: 0.4 mg/dL (ref 0.0–1.2)
Total Protein: 6.7 g/dL (ref 6.5–8.1)

## 2023-12-12 LAB — CBC WITH DIFFERENTIAL/PLATELET
Abs Immature Granulocytes: 0.04 K/uL (ref 0.00–0.07)
Basophils Absolute: 0.1 K/uL (ref 0.0–0.1)
Basophils Relative: 1 %
Eosinophils Absolute: 0.4 K/uL (ref 0.0–0.5)
Eosinophils Relative: 5 %
HCT: 26.2 % — ABNORMAL LOW (ref 39.0–52.0)
Hemoglobin: 7.7 g/dL — ABNORMAL LOW (ref 13.0–17.0)
Immature Granulocytes: 1 %
Lymphocytes Relative: 20 %
Lymphs Abs: 1.4 K/uL (ref 0.7–4.0)
MCH: 20 pg — ABNORMAL LOW (ref 26.0–34.0)
MCHC: 29.4 g/dL — ABNORMAL LOW (ref 30.0–36.0)
MCV: 68.1 fL — ABNORMAL LOW (ref 80.0–100.0)
Monocytes Absolute: 0.7 K/uL (ref 0.1–1.0)
Monocytes Relative: 10 %
Neutro Abs: 4.5 K/uL (ref 1.7–7.7)
Neutrophils Relative %: 63 %
Platelets: 261 K/uL (ref 150–400)
RBC: 3.85 MIL/uL — ABNORMAL LOW (ref 4.22–5.81)
RDW: 18.9 % — ABNORMAL HIGH (ref 11.5–15.5)
WBC: 7.2 K/uL (ref 4.0–10.5)
nRBC: 0 % (ref 0.0–0.2)

## 2023-12-12 LAB — PHOSPHORUS: Phosphorus: 3.4 mg/dL (ref 2.5–4.6)

## 2023-12-12 LAB — MAGNESIUM: Magnesium: 2.3 mg/dL (ref 1.7–2.4)

## 2023-12-12 LAB — GLUCOSE, CAPILLARY
Glucose-Capillary: 224 mg/dL — ABNORMAL HIGH (ref 70–99)
Glucose-Capillary: 214 mg/dL — ABNORMAL HIGH (ref 70–99)

## 2023-12-12 MED ORDER — NALOXONE HCL 4 MG/0.1ML NA LIQD
NASAL | 0 refills | Status: AC
  Filled 2023-12-12: qty 2, 1d supply, fill #0

## 2023-12-12 MED ORDER — FUROSEMIDE 40 MG PO TABS
40.0000 mg | ORAL_TABLET | Freq: Two times a day (BID) | ORAL | Status: DC
  Administered 2023-12-12: 40 mg via ORAL
  Filled 2023-12-12: qty 1

## 2023-12-12 MED ORDER — FUROSEMIDE 40 MG PO TABS
40.0000 mg | ORAL_TABLET | Freq: Every day | ORAL | 0 refills | Status: DC
  Filled 2023-12-12: qty 30, 15d supply, fill #0

## 2023-12-12 MED ORDER — HEPARIN SOD (PORK) LOCK FLUSH 100 UNIT/ML IV SOLN
500.0000 [IU] | INTRAVENOUS | Status: AC | PRN
  Administered 2023-12-12: 500 [IU]
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
## 2023-12-12 NOTE — Progress Notes (Signed)
 Progress Note  Patient Name: Erik Martinez Date of Encounter: 12/12/2023  Primary Cardiologist: Gollan  Subjective   Breathing continues to improve and is consistent with baseline.  No chest pain.  Less orthopnea.  Lower extremity swelling continues to improve.  Documented UOP 1.2 L for the past 24 hours, net - 7.4 L for the admission. Weight trend inaccurate. Renal function improved following holding of afternoon dose of IV Lasix  on 12/1.   Inpatient Medications    Scheduled Meds:  amLODipine   5 mg Oral Daily   atorvastatin   20 mg Oral QHS   Chlorhexidine  Gluconate Cloth  6 each Topical Daily   Chlorhexidine  Gluconate Cloth  6 each Topical Daily   cyclobenzaprine   10 mg Oral TID   diazepam   5 mg Oral QHS   empagliflozin   25 mg Oral Daily   fentaNYL   1 patch Transdermal Q3 days   insulin  aspart  0-15 Units Subcutaneous TID WC   insulin  aspart  0-5 Units Subcutaneous QHS   iron  polysaccharides  150 mg Oral Daily   lidocaine   3 patch Transdermal Q24H   lubiprostone   8 mcg Oral TID   mupirocin  ointment  1 Application Nasal BID   pantoprazole   40 mg Oral Daily   pregabalin   200 mg Oral TID   sodium chloride  flush  10-40 mL Intracatheter Q12H   spironolactone   25 mg Oral Daily   zolpidem   10 mg Oral QHS   Continuous Infusions:  PRN Meds: acetaminophen  **OR** acetaminophen , HYDROmorphone  (DILAUDID ) injection, magic mouthwash w/lidocaine , ondansetron  **OR** ondansetron  (ZOFRAN ) IV, mouth rinse, oxyCODONE , polyethylene glycol, prochlorperazine , sodium chloride  flush, traZODone   Vital Signs    Vitals:   12/11/23 2007 12/11/23 2318 12/12/23 0413 12/12/23 0500  BP: 123/77 127/76 (!) 117/57   Pulse: 99 96 91   Resp: 12 18 18    Temp: 97.8 F (36.6 C) 98.4 F (36.9 C) 98.7 F (37.1 C)   TempSrc:      SpO2: 99% 95% 93%   Weight:    86.2 kg  Height:        Intake/Output Summary (Last 24 hours) at 12/12/2023 0714 Last data filed at 12/11/2023 2136 Gross per 24 hour   Intake 370 ml  Output 1600 ml  Net -1230 ml   Filed Weights   12/11/23 0248 12/11/23 0344 12/12/23 0500  Weight: 82.6 kg 84 kg 86.2 kg    Telemetry    Sinus rhythm with occasional PVC with heart rates in the 80s to 90s bpm - Personally Reviewed  ECG    No new tracings - Personally Reviewed  Physical Exam   GEN: No acute distress.   Neck: No JVD. Cardiac: RRR, II/VI systolic murmur RUSB, no rubs, or gallops.  Respiratory: Clear to auscultation bilaterally.  GI: Soft, nontender, non-distended.   MS: Trivial bilateral pretibial edema; No deformity. Neuro:  Alert and oriented x 3; Nonfocal.  Psych: Normal affect.  Labs    Chemistry Recent Labs  Lab 12/10/23 0212 12/11/23 0400 12/12/23 0455  NA 135 135 133*  K 3.4* 4.2 4.0  CL 99 96* 96*  CO2 27 27 29   GLUCOSE 242* 223* 159*  BUN 11 18 23   CREATININE 0.89 1.31* 0.95  CALCIUM  8.1* 8.1* 8.2*  PROT  --  7.7 6.7  ALBUMIN  --  3.6 3.2*  AST  --  17 13*  ALT  --  8 6  ALKPHOS  --  130* 104  BILITOT  --  0.5 0.4  GFRNONAA >60 60* >60  ANIONGAP 10 11 9      Hematology Recent Labs  Lab 12/10/23 0538 12/11/23 0400 12/12/23 0455  WBC 6.5 7.6 7.2  RBC 3.68* 4.28 3.85*  HGB 7.5* 8.7* 7.7*  HCT 25.2* 29.0* 26.2*  MCV 68.5* 67.8* 68.1*  MCH 20.4* 20.3* 20.0*  MCHC 29.8* 30.0 29.4*  RDW 19.4* 19.8* 18.9*  PLT 262 309 261    Cardiac EnzymesNo results for input(s): TROPONINI in the last 168 hours. No results for input(s): TROPIPOC in the last 168 hours.   BNP Recent Labs  Lab 12/09/23 1848  PROBNP 10,855.0*     DDimer No results for input(s): DDIMER in the last 168 hours.   Radiology    DG Chest 2 View Result Date: 12/09/2023 IMPRESSION: 1. Interstitial and alveolar edema with small bilateral pleural effusions. Electronically signed by: Norman Gatlin MD 12/09/2023 07:14 PM EST RP Workstation: [id number]    Cardiac Studies   2D echo 08/24/2023: 1. Left ventricular ejection fraction, by  estimation, is 50 to 55%. The  left ventricle has low normal function. The left ventricle has no regional  wall motion abnormalities. Left ventricular diastolic parameters are  consistent with Grade I diastolic  dysfunction (impaired relaxation).   2. Right ventricular systolic function is normal. The right ventricular  size is normal. There is normal pulmonary artery systolic pressure. The  estimated right ventricular systolic pressure is 12.4 mmHg.   3. Left atrial size was mildly dilated.   4. The mitral valve is normal in structure. Mild to moderate mitral valve  regurgitation. No evidence of mitral stenosis.   5. The aortic valve is normal in structure. There is mild calcification  of the aortic valve. Aortic valve regurgitation is not visualized. Aortic  valve sclerosis/calcification is present, without any evidence of aortic  stenosis.   6. The inferior vena cava is normal in size with greater than 50%  respiratory variability, suggesting right atrial pressure of 3 mmHg.   Patient Profile     68 y.o. male with history of coronary artery calcification, HFpEF, stage II colon cancer diagnosed in 08/2023 s/p colectomy in 09/2023, microcytic anemia, PVD, DM2, HTN, HLD, bipolar disorder, hepatitis B and C, GERD, OA, and chronic pain syndrome who we are seeing for acute on chronic HFpEF.   Assessment & Plan    1. Acute on chronic HFpEF: - Volume status much improved - IV Lasix  held in the afternoon of 12/1 with up trending renal function  - Continue Jardiance  and spironolactone   - Feels like he diuresed better on Lasix  over torsemide  and would like to go back on Lasix  at discharge - Start oral furosemide  40 mg twice daily - Check BMP in the office in 1 week - CHF education  2. AKI: - Improved following the holding of afternoon dose of IV Lasix  on 12/1 - Avoid nephrotoxic agents  3. Coronary artery calcification with elevated high sensitivity troponin: - No angina - High  sensitivity troponin mildly elevated and flat trending from 30 to 33 - Not consistent with ACS - No indication for heparin  gtt with flat trending troponin and underlying anemia - No plans for inpatient invasive ischemic testing at this time - Pending mayocardial PET/CT later this month - Not currently on aspirin  with underlying anemia  4. HTN:  - Blood pressure well controlled - Remains on amlodipine  5 mg and spironolactone  25 mg  5. HLD: - LDL 54 in 07/2023 - PTA Lipitor 20 mg  6. Microcytic  anemia: - Maintain Hgb > 8 - Contributing to presentation   7. Hypokalemia: - Repleted - Magnesium  at goal      For questions or updates, please contact [facility] Please consult [email or web address] for contact info under Cardiology/STEMI.    Signed, Bernardino Bring, PA-C [facility] Pager: [phone] 12/12/2023, 7:14 AM

## 2023-12-12 NOTE — Care Management Important Message (Signed)
 Important Message  Patient Details  Name: Erik Martinez MRN: [id number] Date of Birth: 1955-07-15   Important Message Given:  Yes - Medicare IM     Londa Mackowski W, CMA 12/12/2023, 1:02 PM

## 2023-12-12 NOTE — Plan of Care (Signed)
  Problem: Education: Goal: Ability to demonstrate management of disease process will improve 12/12/2023 1327 by Leonce Norene BIRCH, RN Outcome: Adequate for Discharge 12/12/2023 1327 by Leonce Norene BIRCH, RN Outcome: Adequate for Discharge Goal: Ability to verbalize understanding of medication therapies will improve Outcome: Adequate for Discharge Goal: Individualized Educational Video(s) Outcome: Adequate for Discharge   Problem: Activity: Goal: Capacity to carry out activities will improve Outcome: Adequate for Discharge   Problem: Cardiac: Goal: Ability to achieve and maintain adequate cardiopulmonary perfusion will improve Outcome: Adequate for Discharge   Problem: Education: Goal: Knowledge of General Education information will improve Description: Including pain rating scale, medication(s)/side effects and non-pharmacologic comfort measures Outcome: Adequate for Discharge   Problem: Health Behavior/Discharge Planning: Goal: Ability to manage health-related needs will improve Outcome: Adequate for Discharge   Problem: Clinical Measurements: Goal: Ability to maintain clinical measurements within normal limits will improve Outcome: Adequate for Discharge Goal: Will remain free from infection Outcome: Adequate for Discharge Goal: Diagnostic test results will improve Outcome: Adequate for Discharge Goal: Respiratory complications will improve Outcome: Adequate for Discharge Goal: Cardiovascular complication will be avoided Outcome: Adequate for Discharge   Problem: Health Behavior/Discharge Planning: Goal: Ability to manage health-related needs will improve Outcome: Adequate for Discharge   Problem: Clinical Measurements: Goal: Ability to maintain clinical measurements within normal limits will improve Outcome: Adequate for Discharge Goal: Will remain free from infection Outcome: Adequate for Discharge Goal: Diagnostic test results will improve Outcome: Adequate for  Discharge Goal: Respiratory complications will improve Outcome: Adequate for Discharge Goal: Cardiovascular complication will be avoided Outcome: Adequate for Discharge   Problem: Nutrition: Goal: Adequate nutrition will be maintained Outcome: Adequate for Discharge   Problem: Coping: Goal: Level of anxiety will decrease Outcome: Adequate for Discharge   Problem: Elimination: Goal: Will not experience complications related to bowel motility Outcome: Adequate for Discharge Goal: Will not experience complications related to urinary retention Outcome: Adequate for Discharge   Problem: Pain Managment: Goal: General experience of comfort will improve and/or be controlled Outcome: Adequate for Discharge   Problem: Safety: Goal: Ability to remain free from injury will improve Outcome: Adequate for Discharge   Problem: Skin Integrity: Goal: Risk for impaired skin integrity will decrease Outcome: Adequate for Discharge   Problem: Education: Goal: Ability to describe self-care measures that may prevent or decrease complications (Diabetes Survival Skills Education) will improve Outcome: Adequate for Discharge Goal: Individualized Educational Video(s) Outcome: Adequate for Discharge   Problem: Coping: Goal: Ability to adjust to condition or change in health will improve Outcome: Adequate for Discharge   Problem: Fluid Volume: Goal: Ability to maintain a balanced intake and output will improve Outcome: Adequate for Discharge   Problem: Health Behavior/Discharge Planning: Goal: Ability to identify and utilize available resources and services will improve Outcome: Adequate for Discharge Goal: Ability to manage health-related needs will improve Outcome: Adequate for Discharge   Problem: Metabolic: Goal: Ability to maintain appropriate glucose levels will improve Outcome: Adequate for Discharge   Problem: Nutritional: Goal: Maintenance of adequate nutrition will  improve Outcome: Adequate for Discharge Goal: Progress toward achieving an optimal weight will improve Outcome: Adequate for Discharge   Problem: Skin Integrity: Goal: Risk for impaired skin integrity will decrease Outcome: Adequate for Discharge   Problem: Tissue Perfusion: Goal: Adequacy of tissue perfusion will improve Outcome: Adequate for Discharge

## 2023-12-12 NOTE — Plan of Care (Signed)

## 2023-12-12 NOTE — Discharge Summary (Signed)
 DISCHARGE SUMMARY    Erik Martinez FMW:[id number] DOB: 06/18/1955 DOA: 12/09/2023  PCP: [name], MD  Admit date: 12/09/2023 Discharge date: 12/12/2023   Recommendations for Outpatient Follow-up:  Follow-up closely with oncology as scheduled Follow-up closely with cardiology as scheduled   Hospital Course: Erik Martinez is a 68 year old male with stage II colon cancer, chronic pain, hepatitis, bipolar disorder, heart failure with preserved EF, osteoarthritis, GERD, type 2 diabetes, peripheral vascular disease, who presents to the ED with worsening dyspnea, lower extremity edema, and orthopnea.  He was recently seen in CHF clinic and torsemide  was increased at that time.  On admission proBNP was over 10,000, CBC with microcytic anemia, he was admitted and started on IV diuresis.  Patient had impressive 8+ liter output.  He did develop mild AKI which improved to and IV diuresis was held. By evaluation on 12/2 patient endorsed feeling back to his baseline and ready to discharge home.   Acute on chronic diastolic CHF Alveolar edema, pleural effusions - proBNP greater than 10,000 - Has had significant volume output, over 8 L since admission - Appears to be getting dry with mild AKI. - Resume p.o. 40 mg Lasix  at home, advised on when to take additional dosing. - Echocardiogram 50 to 55%, grade 1 diastolic dysfunction, - Pending outpatient cardiac PET - Continue with Jardiance  and spironolactone  home meds   AKI - Baseline creatinine 0.88, at 131, likely secondary to Lasix . - Resolved baseline this morning.   Stage II colon cancer - Status post surgical resection.  No positive lymph nodes.  Currently undergoing chemotherapy, has had 1 session so far anticipated 2 of 12 total - Continue outpatient follow-up with Dr. Melanee   Acute on chronic anemia - Secondary to chemotherapy and active malignancy - Monitor hemoglobin closely, may require blood transfusion this admission -  Hemoglobin stable.  No acute bleeding. No indication for transfusion for now   Hypertension - Continue home meds   Dyslipidemia - Continue home meds   Chronic pain - Continue home medications. - Outpatient on oxycodone , fentanyl  patch and pregabalin .  Additionally receiving prescriptions for zolpidem  and diazepam .  PDMP reviewed. - Mix of benzodiazepines and opioids increases his risk of respiratory depression.  Will discuss with him   Body mass index is 27.33 kg/m. - Outpatient follow up for lifestyle modification and risk factor management   Hypokalemia - Continue to replace   Bipolar disorder - Taking Valium  outpatient.   Type 2 diabetes - Most recent hemoglobin A1c October 7.5% - On Ozempic  and insulin . - Continue with sliding scale while admitted  Discharge Instructions  Discharge Instructions     Call MD for:  difficulty breathing, headache or visual disturbances   Complete by: As directed    Call MD for:  persistant dizziness or light-headedness   Complete by: As directed    Call MD for:  persistant nausea and vomiting   Complete by: As directed    Call MD for:  severe uncontrolled pain   Complete by: As directed    Call MD for:  temperature >100.4   Complete by: As directed    Diet general   Complete by: As directed    Discharge instructions   Complete by: As directed    Follow up with your primary care physician to discuss the medication changes during this admission   Increase activity slowly   Complete by: As directed       Allergies as of 12/12/2023  Reactions   Suboxone [buprenorphine Hcl-naloxone  Hcl] Nausea And Vomiting   Tape Other (See Comments)   Whelps *Paper Tape is ok    Codeine Rash, Itching, Nausea Only   Morphine  And Codeine Itching, Rash   Other Rash   Telemetry electrodes   Silicone Rash   Whelps -*Paper Tape is ok         Medication List     STOP taking these medications    torsemide  20 MG tablet Commonly known  as: DEMADEX        TAKE these medications    amLODipine  5 MG tablet Commonly known as: NORVASC  Take 1 tablet (5 mg total) by mouth daily.   atorvastatin  20 MG tablet Commonly known as: LIPITOR Take 20 mg by mouth at bedtime.   celecoxib  200 MG capsule Commonly known as: CELEBREX  Take 1 capsule (200 mg total) by mouth 2 (two) times daily.   cyclobenzaprine  10 MG tablet Commonly known as: FLEXERIL  Take 10 mg by mouth 3 (three) times daily.   dexamethasone  0.5 MG/5ML solution Commonly known as: DECADRON  Take 10 mLs (1 mg total) by mouth in the morning, at noon, in the evening, and at bedtime. Swish 10 ml (1 mg total) of solution in mouth for 2 minutes then spit. Repeat every 6 hours. Do not swallow. Do not take anything by mouth for 1 hour after taking. For mucositis.   diazepam  5 MG tablet Commonly known as: VALIUM  TAKE 1 TABLET BY MOUTH AT BEDTIME   empagliflozin  25 MG Tabs tablet Commonly known as: JARDIANCE  Take 1 tablet (25 mg total) by mouth daily.   fentaNYL  25 MCG/HR Commonly known as: DURAGESIC  Place 1 patch onto the skin every 3 (three) days.   FreeStyle Libre 3 Plus Sensor Misc CHANCE SENSOR EVERY 15 DAYS   furosemide  40 MG tablet Commonly known as: LASIX  Take 1 tablet (40 mg total) by mouth daily. Take at least 1 tab daily.  If having a high salt meal or you notice that you are becoming more edematous take a second dose in the evening   insulin  aspart 100 UNIT/ML injection Commonly known as: novoLOG  Per sliding scale  MAX 15 units in 24 hour period.   iron  polysaccharides 150 MG capsule Commonly known as: NIFEREX Take 1 capsule (150 mg total) by mouth daily.   lidocaine  5 % Commonly known as: Lidoderm  Place 3 patches onto the skin daily. Remove & Discard patch within 12 hours or as directed by MD   lidocaine -prilocaine  cream Commonly known as: EMLA  Apply to affected area once   lubiprostone  8 MCG capsule Commonly known as: AMITIZA  Take 1 capsule  (8 mcg total) by mouth 3 (three) times daily.   magic mouthwash w/lidocaine  Soln Take 5 mLs by mouth 4 (four) times daily as needed for mouth pain. swish and swallow   naloxone  4 MG/0.1ML Liqd nasal spray kit Commonly known as: NARCAN  Take in case of opioid overdose   ondansetron  8 MG tablet Commonly known as: Zofran  Take 1 tablet (8 mg total) by mouth every 8 (eight) hours as needed for nausea or vomiting.   Oxycodone  HCl 20 MG Tabs Take 1 tablet by mouth every 4 (four) hours as needed (Pain).   Ozempic  (2 MG/DOSE) 8 MG/3ML Sopn Generic drug: Semaglutide  (2 MG/DOSE) Inject 2 mg into the skin once a week. Monday   pantoprazole  40 MG tablet Commonly known as: Protonix  Take 1 tablet (40 mg total) by mouth daily.   polyethylene glycol 17 g packet Commonly  known as: MIRALAX  / GLYCOLAX  Take 17 g by mouth daily as needed for mild constipation.   potassium chloride  SA 20 MEQ tablet Commonly known as: KLOR-CON  M Take 1 tablet (20 mEq total) by mouth daily.   pregabalin  200 MG capsule Commonly known as: Lyrica  Take 1 capsule (200 mg total) by mouth in the morning, at noon, and at bedtime.   prochlorperazine  10 MG tablet Commonly known as: COMPAZINE  Take 1 tablet (10 mg total) by mouth every 6 (six) hours as needed for nausea or vomiting.   spironolactone  25 MG tablet Commonly known as: ALDACTONE  Take 1 tablet (25 mg total) by mouth daily.   tadalafil 20 MG tablet Commonly known as: CIALIS Take 20 mg by mouth daily as needed for erectile dysfunction.   terbinafine  250 MG tablet Commonly known as: LAMISIL  TAKE 1 TABLET BY MOUTH ONCE DAILY   triamcinolone  cream 0.5 % Commonly known as: KENALOG  Apply 1 Application topically 2 (two) times daily.   zolpidem  10 MG tablet Commonly known as: AMBIEN  Take 1 tablet (10 mg total) by mouth at bedtime.        Follow-up Information     [facility]. Go on 12/27/2023.   Specialty:  Cardiology Why: Hospital Follow-Up 12/27/2023 @ 11:30  Please bring all medications to follow-up appointment [facility], [address] Free Valet Parking at the door Contact information: [address] [place]  [phone]               Allergies  Allergen Reactions   Suboxone [Buprenorphine Hcl-Naloxone  Hcl] Nausea And Vomiting   Tape Other (See Comments)    Whelps *Paper Tape is ok    Codeine Rash, Itching and Nausea Only   Morphine  And Codeine Itching and Rash   Other Rash    Telemetry electrodes   Silicone Rash    Whelps -*Paper Tape is ok     Consultations:    Procedures/Studies: DG Chest 2 View Result Date: 12/09/2023 EXAM: 2 VIEW(S) XRAY OF THE CHEST 12/09/2023 07:09:00 PM COMPARISON: 10/27/2023 CLINICAL HISTORY: SOB FINDINGS: LINES, TUBES AND DEVICES: Right chest port in place. LUNGS AND PLEURA: Pulmonary interstitial prominence worsen compared to 10 / 17 / 25. Patchy bibasilar airspace opacities. Small bilateral pleural effusions. No pneumothorax. HEART AND MEDIASTINUM: Cardiomegaly. Calcified aorta. BONES AND SOFT TISSUES: No acute osseous abnormality. IMPRESSION: 1. Interstitial and alveolar edema with small bilateral pleural effusions. Electronically signed by: Norman Gatlin MD 12/09/2023 07:14 PM EST RP Workstation: [id number]      Discharge Exam: Vitals:   12/12/23 0746 12/12/23 1206  BP: (!) 112/54 114/65  Pulse: 85 90  Resp: 18 18  Temp: 97.9 F (36.6 C) 97.9 F (36.6 C)  SpO2: 99% 100%   Vitals:   12/12/23 0413 12/12/23 0500 12/12/23 0746 12/12/23 1206  BP: (!) 117/57  (!) 112/54 114/65  Pulse: 91  85 90  Resp: 18  18 18   Temp: 98.7 F (37.1 C)  97.9 F (36.6 C) 97.9 F (36.6 C)  TempSrc:   Oral   SpO2: 93%  99% 100%  Weight:  86.2 kg    Height:        Constitutional:  Normal appearance. Non toxic-appearing.  HENT: Head Normocephalic and atraumatic.  Mucous membranes  are moist.  Eyes:  Extraocular intact. Conjunctivae normal.  Cardiovascular: Rate and Rhythm: Normal rate and regular rhythm.  Pulmonary: Non labored, symmetric rise of chest wall.  Skin:  warm and dry. not jaundiced.  Neurological: No focal deficit present. alert. Oriented.  Psychiatric: Mood and Affect congruent.    The results of significant diagnostics from this hospitalization (including imaging, microbiology, ancillary and laboratory) are listed below for reference.     Microbiology: Recent Results (from the past 240 hours)  MRSA Next Gen by PCR, Nasal     Status: Abnormal   Collection Time: 12/11/23  2:58 AM   Specimen: Nasal Mucosa; Nasal Swab  Result Value Ref Range Status   MRSA by PCR Next Gen DETECTED (A) NOT DETECTED Final    Comment: RESULT CALLED TO, READ BACK BY AND VERIFIED WITH: DANIA CRUTCH, RN @0443  12/11/2023 COP (NOTE) The GeneXpert MRSA Assay (FDA approved for NASAL specimens only), is one component of a comprehensive MRSA colonization surveillance program. It is not intended to diagnose MRSA infection nor to guide or monitor treatment for MRSA infections. Test performance is not FDA approved in patients less than 15 years old. Performed at [facility], [address] [place], [place] [address]      Labs: BNP (last 3 results) Recent Labs    08/22/23 1053 09/04/23 1700  BNP 1,782.6* 1,648.0*   Basic Metabolic Panel: Recent Labs  Lab 12/09/23 1848 12/10/23 0212 12/11/23 0400 12/12/23 0455  NA 134* 135 135 133*  K 3.9 3.4* 4.2 4.0  CL 98 99 96* 96*  CO2 25 27 27 29   GLUCOSE 141* 242* 223* 159*  BUN 11 11 18 23   CREATININE 0.88 0.89 1.31* 0.95  CALCIUM  8.8* 8.1* 8.1* 8.2*  MG  --   --  2.2 2.3  PHOS  --   --  3.1 3.4   Liver Function Tests: Recent Labs  Lab 12/11/23 0400 12/12/23 0455  AST 17 13*  ALT 8 6  ALKPHOS 130* 104  BILITOT 0.5 0.4  PROT 7.7 6.7  ALBUMIN 3.6 3.2*   No results for input(s): LIPASE,  AMYLASE in the last 168 hours. No results for input(s): AMMONIA in the last 168 hours. CBC: Recent Labs  Lab 12/09/23 1848 12/10/23 0538 12/11/23 0400 12/12/23 0455  WBC 9.9 6.5 7.6 7.2  NEUTROABS  --   --  4.8 4.5  HGB 8.4* 7.5* 8.7* 7.7*  HCT 28.3* 25.2* 29.0* 26.2*  MCV 68.5* 68.5* 67.8* 68.1*  PLT 297 262 309 261   Cardiac Enzymes: No results for input(s): CKTOTAL, CKMB, CKMBINDEX, TROPONINI in the last 168 hours. BNP: Invalid input(s): POCBNP CBG: Recent Labs  Lab 12/11/23 1139 12/11/23 1717 12/11/23 2138 12/12/23 0745 12/12/23 1207  GLUCAP 189* 214* 163* 224* 214*   D-Dimer No results for input(s): DDIMER in the last 72 hours. Hgb A1c No results for input(s): HGBA1C in the last 72 hours. Lipid Profile No results for input(s): CHOL, HDL, LDLCALC, TRIG, CHOLHDL, LDLDIRECT in the last 72 hours. Thyroid  function studies No results for input(s): TSH, T4TOTAL, T3FREE, THYROIDAB in the last 72 hours.  Invalid input(s): FREET3 Anemia work up No results for input(s): VITAMINB12, FOLATE, FERRITIN, TIBC, IRON , RETICCTPCT in the last 72 hours. Urinalysis    Component Value Date/Time   COLORURINE STRAW (A) 08/23/2023 1450   APPEARANCEUR CLEAR (A) 08/23/2023 1450   APPEARANCEUR Turbid 05/20/2013 0304   LABSPEC 1.006 08/23/2023 1450   LABSPEC 1.017 05/20/2013 0304   PHURINE 7.0 08/23/2023 1450   GLUCOSEU >=500 (A) 08/23/2023 1450   GLUCOSEU >=500 05/20/2013 0304   HGBUR NEGATIVE 08/23/2023 1450   BILIRUBINUR NEGATIVE 08/23/2023 1450   BILIRUBINUR Negative  08/18/2023 1655   BILIRUBINUR Negative 05/20/2013 0304   KETONESUR NEGATIVE 08/23/2023 1450   PROTEINUR NEGATIVE 08/23/2023 1450   UROBILINOGEN 1.0 08/18/2023 1655   UROBILINOGEN 0.2 04/30/2007 1140   NITRITE NEGATIVE 08/23/2023 1450   LEUKOCYTESUR NEGATIVE 08/23/2023 1450   LEUKOCYTESUR Negative 05/20/2013 0304   Sepsis Labs Recent Labs  Lab 12/09/23 1848  12/10/23 0538 12/11/23 0400 12/12/23 0455  WBC 9.9 6.5 7.6 7.2   Microbiology Recent Results (from the past 240 hours)  MRSA Next Gen by PCR, Nasal     Status: Abnormal   Collection Time: 12/11/23  2:58 AM   Specimen: Nasal Mucosa; Nasal Swab  Result Value Ref Range Status   MRSA by PCR Next Gen DETECTED (A) NOT DETECTED Final    Comment: RESULT CALLED TO, READ BACK BY AND VERIFIED WITH: DANIA CRUTCH, RN @0443  12/11/2023 COP (NOTE) The GeneXpert MRSA Assay (FDA approved for NASAL specimens only), is one component of a comprehensive MRSA colonization surveillance program. It is not intended to diagnose MRSA infection nor to guide or monitor treatment for MRSA infections. Test performance is not FDA approved in patients less than 37 years old. Performed at [facility], [address], [place], [place] [address]      Time coordinating discharge: 32 min   SIGNED: Lorane Poland, DO [facility] 12/12/2023, 2:06 PM Pager   If 7PM-7AM, please contact night-coverage

## 2023-12-13 ENCOUNTER — Telehealth: Payer: Self-pay

## 2023-12-13 NOTE — Transitions of Care (Post Inpatient/ED Visit) (Signed)
   12/13/2023  Name: MILLEDGE GERDING MRN: [id number] DOB: 05-09-1955  Today's TOC FU Call Status: Today's TOC FU Call Status:: Unsuccessful Call (1st Attempt) Unsuccessful Call (1st Attempt) Date: 12/13/23  Attempted to reach the patient regarding the most recent Inpatient/ED visit.  Follow Up Plan: Additional outreach attempts will be made to reach the patient to complete the Transitions of Care (Post Inpatient/ED visit) call.   Arvin Seip RN, BSN, CCM [employer], Population Health Case Manager Phone: [phone]

## 2023-12-14 ENCOUNTER — Telehealth: Payer: Self-pay

## 2023-12-14 NOTE — Transitions of Care (Post Inpatient/ED Visit) (Signed)
 12/14/2023  Name: Erik Martinez MRN: [id number] DOB: October 21, 1955  Today's TOC FU Call Status: Today's TOC FU Call Status:: Successful TOC FU Call Completed TOC FU Call Complete Date: 12/14/23  Patient's Name and Date of Birth confirmed. Name, DOB  Transition Care Management Follow-up Telephone Call Date of Discharge: 12/12/23 Discharge Facility: [facility]) Type of Discharge: Inpatient Admission Primary Inpatient Discharge Diagnosis:: acute on chronic diastolic congestive heart failure/ alveolar edema pleural effusion How have you been since you were released from the hospital?: Better Any questions or concerns?: Yes Patient Questions/Concerns:: patient states he would like to know how to get his xanax  refilled. Per chart review patients xanax  was prescribed by oncologist.   Patient advised to request refill from oncology office. Reminded patient that he has a 9 am appointment with his oncologist on tomorrow 12/15/23.  Patient verbalized appreciation of reminder and states he has transportation to his appointment. Patient Questions/Concerns Addressed: Other:  Items Reviewed: Did you receive and understand the discharge instructions provided?: Yes Medications obtained,verified, and reconciled?: Yes (Medications Reviewed) Any new allergies since your discharge?: No Dietary orders reviewed?: Yes Type of Diet Ordered:: Low salt heart healthy Do you have support at home?: Yes People in Home [RPT]: other relative(s) Name of Support/Comfort Primary Source: Kriss and Koren Louder  Medications Reviewed Today: Medications Reviewed Today     Reviewed by Derrion Tritz E, RN (Registered Nurse) on 12/14/23 at 1328  Med List Status: <None>   Medication Order Taking? Sig Documenting Provider Last Dose Status Informant  amLODipine  (NORVASC ) 5 MG tablet [id number] Yes Take 1 tablet (5 mg total) by mouth daily. Zenaida Morene PARAS, MD  Active Self, Pharmacy Records, Other   atorvastatin  (LIPITOR) 20 MG tablet [id number] Yes Take 20 mg by mouth at bedtime. [provider]  Active Self, Pharmacy Records, Other  celecoxib  (CELEBREX ) 200 MG capsule [id number] Yes Take 1 capsule (200 mg total) by mouth 2 (two) times daily. [name], MD  Active Self, Pharmacy Records, Other  Continuous Glucose Sensor (FREESTYLE LIBRE 3 PLUS SENSOR) MISC [id number] Yes CHANCE SENSOR EVERY 15 DAYS Bedsole, Amy E, MD  Active Self, Pharmacy Records, Other  cyclobenzaprine  (FLEXERIL ) 10 MG tablet [id number] Yes Take 10 mg by mouth 3 (three) times daily. [provider]  Active Self, Pharmacy Records, Other  dexamethasone  (DECADRON ) 0.5 MG/5ML solution [id number] Yes Take 10 mLs (1 mg total) by mouth in the morning, at noon, in the evening, and at bedtime. Swish 10 ml (1 mg total) of solution in mouth for 2 minutes then spit. Repeat every 6 hours. Do not swallow. Do not take anything by mouth for 1 hour after taking. For mucositis. Dasie Tinnie MATSU, NP  Active Self, Pharmacy Records, Other  diazepam  (VALIUM ) 5 MG tablet [id number]  TAKE 1 TABLET BY MOUTH AT BEDTIME  Patient not taking: Reported on 12/14/2023   [name], MD  Active Self, Pharmacy Records, Other  empagliflozin  (JARDIANCE ) 25 MG TABS tablet [id number] Yes Take 1 tablet (25 mg total) by mouth daily. [name], Rosaline GORMAN, FNP  Active Self, Pharmacy Records, Other  fentaNYL  (DURAGESIC ) 25 MCG/HR [id number] Yes Place 1 patch onto the skin every 3 (three) days. [provider]  Active Self, Pharmacy Records, Other           Med Note GAYLENE, ANJA J   Sun Dec 10, 2023 12:50 AM) Currently applied (3rd day).   furosemide  (LASIX ) 40 MG tablet [id number] Yes Take 1 tablet (  40 mg total) by mouth daily. Take at least 1 tab daily.  If having a high salt meal or you notice that you are becoming more edematous take a second dose in the evening Dezii, Alexandra, DO  Active   insulin  aspart (NOVOLOG ) 100 UNIT/ML injection  [id number] Yes Per sliding scale  MAX 15 units in 24 hour period. [name], MD  Active Self, Pharmacy Records, Other  iron  polysaccharides (NIFEREX) 150 MG capsule [id number] Yes Take 1 capsule (150 mg total) by mouth daily. Barbarann Nest, MD  Active Self, Pharmacy Records, Other  lidocaine  (LIDODERM ) 5 % [id number] Yes Place 3 patches onto the skin daily. Remove & Discard patch within 12 hours or as directed by MD [name], MD  Active Self, Pharmacy Records, Other           Med Note GAYLENE, DARVIN JINNY Repress Dec 10, 2023 12:49 AM) Not currently applied  lidocaine -prilocaine  (EMLA ) cream [id number] Yes Apply to affected area once [name], MD  Active Self, Pharmacy Records, Other  lubiprostone  (AMITIZA ) 8 MCG capsule [id number] Yes Take 1 capsule (8 mcg total) by mouth 3 (three) times daily. [name], MD  Active Self, Pharmacy Records, Other  magic mouthwash w/lidocaine  SOLN [id number] Yes Take 5 mLs by mouth 4 (four) times daily as needed for mouth pain. swish and swallow Borders, Fonda SAUNDERS, NP  Active Self, Pharmacy Records, Other  naloxone  (NARCAN ) nasal spray 4 mg/0.1 mL [id number] Yes Take in case of opioid overdose Dezii, Alexandra, DO  Active   ondansetron  (ZOFRAN ) 8 MG tablet [id number] Yes Take 1 tablet (8 mg total) by mouth every 8 (eight) hours as needed for nausea or vomiting. [name], MD  Active Self, Pharmacy Records, Other  Oxycodone  HCl 20 MG TABS [id number] Yes Take 1 tablet by mouth every 4 (four) hours as needed (Pain). [provider]  Active Self, Pharmacy Records, Other  pantoprazole  (PROTONIX ) 40 MG tablet [id number] Yes Take 1 tablet (40 mg total) by mouth daily. Schnier, Cordella MATSU, MD  Active Self, Pharmacy Records, Other  polyethylene glycol (MIRALAX  / GLYCOLAX ) 17 g packet [id number] Yes Take 17 g by mouth daily as needed for mild constipation. Barbarann Nest, MD  Active Self, Pharmacy Records, Other  potassium chloride  SA (KLOR-CON  M) 20 MEQ tablet  [id number] Yes Take 1 tablet (20 mEq total) by mouth daily. Donette Ellouise LABOR, FNP  Active Self, Pharmacy Records, Other  pregabalin  (LYRICA ) 200 MG capsule [id number] Yes Take 1 capsule (200 mg total) by mouth in the morning, at noon, and at bedtime. [name], MD  Active Self, Pharmacy Records, Other  prochlorperazine  (COMPAZINE ) 10 MG tablet [id number] Yes Take 1 tablet (10 mg total) by mouth every 6 (six) hours as needed for nausea or vomiting. [name], MD  Active Self, Pharmacy Records, Other  Semaglutide , 2 MG/DOSE, (OZEMPIC , 2 MG/DOSE,) 8 MG/3ML SOPN [id number] Yes Inject 2 mg into the skin once a week. Monday [name], MD  Active Self, Pharmacy Records, Other  spironolactone  (ALDACTONE ) 25 MG tablet [id number] Yes Take 1 tablet (25 mg total) by mouth daily. Donette Ellouise LABOR, FNP  Active Self, Pharmacy Records, Other  tadalafil (CIALIS) 20 MG tablet [id number] Yes Take 20 mg by mouth daily as needed for erectile dysfunction. [provider]  Active Self, Pharmacy Records, Other  terbinafine  (LAMISIL ) 250 MG tablet [id number] Yes TAKE 1 TABLET BY MOUTH ONCE DAILY Janit Brent M, DPM  Active  Self, Pharmacy Records, Other  triamcinolone  cream (KENALOG ) 0.5 % [id number] Yes Apply 1 Application topically 2 (two) times daily. [name], MD  Active Self, Pharmacy Records, Other  zolpidem  (AMBIEN ) 10 MG tablet [id number] Yes Take 1 tablet (10 mg total) by mouth at bedtime. [name], MD  Active Self, Pharmacy Records, Other            Home Care and Equipment/Supplies: Were Home Health Services Ordered?: No Any new equipment or medical supplies ordered?: No  Functional Questionnaire: Do you need assistance with bathing/showering or dressing?: No Do you need assistance with meal preparation?: No Do you need assistance with eating?: No Do you have difficulty maintaining continence: No Do you need assistance with getting out of bed/getting out of a chair/moving?: No Do  you have difficulty managing or taking your medications?: No  Follow up appointments reviewed: PCP Follow-up appointment confirmed?: Yes Date of PCP follow-up appointment?: 12/19/23 Follow-up Provider: Dr. Greig Avelina Specialist [facility] Follow-up appointment confirmed?: Yes Date of Specialist follow-up appointment?: 12/27/23 Follow-Up Specialty Provider:: Ellouise Class, NP Do you need transportation to your follow-up appointment?: No Do you understand care options if your condition(s) worsen?: Yes-patient verbalized understanding  SDOH Interventions Today    Flowsheet Row Most Recent Value  SDOH Interventions   Food Insecurity Interventions Intervention Not Indicated  Housing Interventions Intervention Not Indicated  Transportation Interventions Intervention Not Indicated  Utilities Interventions Intervention Not Indicated   Discussed and offered 30 day TOC program.  Patient declined.  The patient has been provided with contact information for the care management team and has been advised to call with any health -related questions or concerns.  The patient verbalized understanding with current plan of care.  The patient is directed to their insurance card regarding availability of benefits coverage.    Arvin Seip RN, BSN, CCM [employer], Population Health Case Manager Phone: [phone]

## 2023-12-14 NOTE — Patient Instructions (Signed)
 Visit Information  Thank you for taking time to visit with me today. Please don't hesitate to contact me if I can be of assistance to you.    Patient instructions:  Report to your doctor weight gain of 2 -3 pounds in a day or 5 pounds in a week Please weight daily or as ordered by your doctor Limit salt intake Monitor for shortness of breath, swelling of feet, ankles or abdomen and weight gain. Never use the saltshaker. Read all food labels and avoid canned, processed, and pickled foods. Follow your doctor's recommendations for daily salt intake Keep follow up appointments with provider Report any new/ ongoing symptoms to your provider Request refill on you xanax  at your oncology appointment 12/15/23.   Patient verbalizes understanding of instructions and care plan provided today and agrees to view in MyChart. Active MyChart status and patient understanding of how to access instructions and care plan via MyChart confirmed with patient.     The patient has been provided with contact information for the care management team and has been advised to call with any health related questions or concerns.   Please call the care guide team at [phone] if you need to cancel or reschedule your appointment.   Please call the Suicide and Crisis Lifeline: 988 call the [place]  National Suicide Prevention Lifeline: 364-659-1393 or TTY: 281-739-4853 TTY 2703893267) to talk to a trained counselor call 1-800-273-TALK (toll free, 24 hour hotline) if you are experiencing a Mental Health or Behavioral Health Crisis or need someone to talk to.  Arvin Seip RN, BSN, CCM [employer], Population Health Case Manager Phone: [phone]

## 2023-12-15 ENCOUNTER — Inpatient Hospital Stay: Attending: Oncology | Admitting: Oncology

## 2023-12-15 ENCOUNTER — Encounter: Payer: Self-pay | Admitting: Oncology

## 2023-12-15 ENCOUNTER — Other Ambulatory Visit: Payer: Self-pay | Admitting: Family Medicine

## 2023-12-15 ENCOUNTER — Other Ambulatory Visit: Payer: Self-pay

## 2023-12-15 ENCOUNTER — Ambulatory Visit: Payer: Self-pay | Admitting: Oncology

## 2023-12-15 ENCOUNTER — Inpatient Hospital Stay

## 2023-12-15 ENCOUNTER — Inpatient Hospital Stay: Attending: Oncology

## 2023-12-15 VITALS — BP 119/53 | HR 90 | Temp 98.4°F | Resp 18 | Ht 69.0 in | Wt 184.3 lb

## 2023-12-15 VITALS — BP 122/61

## 2023-12-15 DIAGNOSIS — Z7985 Long-term (current) use of injectable non-insulin antidiabetic drugs: Secondary | ICD-10-CM | POA: Diagnosis not present

## 2023-12-15 DIAGNOSIS — C189 Malignant neoplasm of colon, unspecified: Secondary | ICD-10-CM

## 2023-12-15 DIAGNOSIS — E1140 Type 2 diabetes mellitus with diabetic neuropathy, unspecified: Secondary | ICD-10-CM | POA: Diagnosis not present

## 2023-12-15 DIAGNOSIS — M1990 Unspecified osteoarthritis, unspecified site: Secondary | ICD-10-CM | POA: Insufficient documentation

## 2023-12-15 DIAGNOSIS — Z5111 Encounter for antineoplastic chemotherapy: Secondary | ICD-10-CM | POA: Insufficient documentation

## 2023-12-15 DIAGNOSIS — Z801 Family history of malignant neoplasm of trachea, bronchus and lung: Secondary | ICD-10-CM | POA: Diagnosis not present

## 2023-12-15 DIAGNOSIS — R569 Unspecified convulsions: Secondary | ICD-10-CM | POA: Diagnosis not present

## 2023-12-15 DIAGNOSIS — G4700 Insomnia, unspecified: Secondary | ICD-10-CM | POA: Diagnosis not present

## 2023-12-15 DIAGNOSIS — N319 Neuromuscular dysfunction of bladder, unspecified: Secondary | ICD-10-CM | POA: Diagnosis not present

## 2023-12-15 DIAGNOSIS — Z7984 Long term (current) use of oral hypoglycemic drugs: Secondary | ICD-10-CM | POA: Insufficient documentation

## 2023-12-15 DIAGNOSIS — E785 Hyperlipidemia, unspecified: Secondary | ICD-10-CM | POA: Insufficient documentation

## 2023-12-15 DIAGNOSIS — Z79899 Other long term (current) drug therapy: Secondary | ICD-10-CM | POA: Insufficient documentation

## 2023-12-15 DIAGNOSIS — F411 Generalized anxiety disorder: Secondary | ICD-10-CM

## 2023-12-15 DIAGNOSIS — E1151 Type 2 diabetes mellitus with diabetic peripheral angiopathy without gangrene: Secondary | ICD-10-CM | POA: Insufficient documentation

## 2023-12-15 DIAGNOSIS — I5032 Chronic diastolic (congestive) heart failure: Secondary | ICD-10-CM | POA: Diagnosis not present

## 2023-12-15 DIAGNOSIS — Z791 Long term (current) use of non-steroidal anti-inflammatories (NSAID): Secondary | ICD-10-CM | POA: Insufficient documentation

## 2023-12-15 DIAGNOSIS — Z7952 Long term (current) use of systemic steroids: Secondary | ICD-10-CM | POA: Diagnosis not present

## 2023-12-15 DIAGNOSIS — Z79891 Long term (current) use of opiate analgesic: Secondary | ICD-10-CM | POA: Diagnosis not present

## 2023-12-15 DIAGNOSIS — G894 Chronic pain syndrome: Secondary | ICD-10-CM | POA: Diagnosis not present

## 2023-12-15 LAB — FERRITIN: Ferritin: 30 ng/mL (ref 24–336)

## 2023-12-15 LAB — CBC WITH DIFFERENTIAL (CANCER CENTER ONLY)
Abs Immature Granulocytes: 0.04 K/uL (ref 0.00–0.07)
Basophils Absolute: 0.1 K/uL (ref 0.0–0.1)
Basophils Relative: 1 %
Eosinophils Absolute: 0.5 K/uL (ref 0.0–0.5)
Eosinophils Relative: 7 %
HCT: 28.1 % — ABNORMAL LOW (ref 39.0–52.0)
Hemoglobin: 8.3 g/dL — ABNORMAL LOW (ref 13.0–17.0)
Immature Granulocytes: 1 %
Lymphocytes Relative: 13 %
Lymphs Abs: 1.1 K/uL (ref 0.7–4.0)
MCH: 20 pg — ABNORMAL LOW (ref 26.0–34.0)
MCHC: 29.5 g/dL — ABNORMAL LOW (ref 30.0–36.0)
MCV: 67.9 fL — ABNORMAL LOW (ref 80.0–100.0)
Monocytes Absolute: 0.7 K/uL (ref 0.1–1.0)
Monocytes Relative: 9 %
Neutro Abs: 5.9 K/uL (ref 1.7–7.7)
Neutrophils Relative %: 69 %
Platelet Count: 243 K/uL (ref 150–400)
RBC: 4.14 MIL/uL — ABNORMAL LOW (ref 4.22–5.81)
RDW: 18.7 % — ABNORMAL HIGH (ref 11.5–15.5)
WBC Count: 8.4 K/uL (ref 4.0–10.5)
nRBC: 0 % (ref 0.0–0.2)

## 2023-12-15 LAB — CMP (CANCER CENTER ONLY)
ALT: 8 U/L (ref 0–44)
AST: 20 U/L (ref 15–41)
Albumin: 3.6 g/dL (ref 3.5–5.0)
Alkaline Phosphatase: 106 U/L (ref 38–126)
Anion gap: 10 (ref 5–15)
BUN: 24 mg/dL — ABNORMAL HIGH (ref 8–23)
CO2: 28 mmol/L (ref 22–32)
Calcium: 8.5 mg/dL — ABNORMAL LOW (ref 8.9–10.3)
Chloride: 94 mmol/L — ABNORMAL LOW (ref 98–111)
Creatinine: 1.08 mg/dL (ref 0.61–1.24)
GFR, Estimated: >60 mL/min (ref >60)
Glucose, Bld: 303 mg/dL — ABNORMAL HIGH (ref 70–99)
Potassium: 3.9 mmol/L (ref 3.5–5.1)
Sodium: 131 mmol/L — ABNORMAL LOW (ref 135–145)
Total Bilirubin: 0.4 mg/dL (ref 0.0–1.2)
Total Protein: 7.5 g/dL (ref 6.5–8.1)

## 2023-12-15 LAB — IRON AND TIBC
Iron: 21 ug/dL — ABNORMAL LOW (ref 45–182)
Saturation Ratios: 5 % — ABNORMAL LOW (ref 17.9–39.5)
TIBC: 410 ug/dL (ref 250–450)
UIBC: 390 ug/dL

## 2023-12-15 MED ORDER — SODIUM CHLORIDE 0.9 % IV SOLN
400.0000 mg/m2 | Freq: Once | INTRAVENOUS | Status: AC
  Administered 2023-12-15: 784 mg via INTRAVENOUS
  Filled 2023-12-15: qty 21.7

## 2023-12-15 MED ORDER — PROCHLORPERAZINE MALEATE 10 MG PO TABS
10.0000 mg | ORAL_TABLET | Freq: Once | ORAL | Status: AC
  Administered 2023-12-15: 10 mg via ORAL
  Filled 2023-12-15: qty 1

## 2023-12-15 MED ORDER — SODIUM CHLORIDE 0.9 % IV SOLN
INTRAVENOUS | Status: DC
  Filled 2023-12-15: qty 250

## 2023-12-15 MED ORDER — SODIUM CHLORIDE 0.9 % IV SOLN
2000.0000 mg/m2 | INTRAVENOUS | Status: DC
  Administered 2023-12-15: 3900 mg via INTRAVENOUS
  Filled 2023-12-15: qty 78

## 2023-12-15 MED ORDER — DIAZEPAM 5 MG PO TABS: 5.0000 mg | ORAL_TABLET | Freq: Every day | ORAL | 0 refills | Status: DC

## 2023-12-15 NOTE — Patient Instructions (Signed)
 [facility] - A DEPT OF [facility]  Discharge Instructions: Thank you for choosing [facility] to provide your oncology and hematology care.  If you have a lab appointment with the Cancer Center, please go directly to the Cancer Center and check in at the registration area.  Wear comfortable clothing and clothing appropriate for easy access to any Portacath or PICC line.   We strive to give you quality time with your provider. You may need to reschedule your appointment if you arrive late (15 or more minutes).  Arriving late affects you and other patients whose appointments are after yours.  Also, if you miss three or more appointments without notifying the office, you may be dismissed from the clinic at the provider's discretion.      For prescription refill requests, have your pharmacy contact our office and allow 72 hours for refills to be completed.    Today you received the following chemotherapy and/or immunotherapy agents LEUCOVRIN and 5 FU      To help prevent nausea and vomiting after your treatment, we encourage you to take your nausea medication as directed.  BELOW ARE SYMPTOMS THAT SHOULD BE REPORTED IMMEDIATELY: *FEVER GREATER THAN 100.4 F (38 C) OR HIGHER *CHILLS OR SWEATING *NAUSEA AND VOMITING THAT IS NOT CONTROLLED WITH YOUR NAUSEA MEDICATION *UNUSUAL SHORTNESS OF BREATH *UNUSUAL BRUISING OR BLEEDING *URINARY PROBLEMS (pain or burning when urinating, or frequent urination) *BOWEL PROBLEMS (unusual diarrhea, constipation, pain near the anus) TENDERNESS IN MOUTH AND THROAT WITH OR WITHOUT PRESENCE OF ULCERS (sore throat, sores in mouth, or a toothache) UNUSUAL RASH, SWELLING OR PAIN  UNUSUAL VAGINAL DISCHARGE OR ITCHING   Items with * indicate a potential emergency and should be followed up as soon as possible or go to the Emergency Department if any problems should occur.  Please show the CHEMOTHERAPY ALERT CARD or  IMMUNOTHERAPY ALERT CARD at check-in to the Emergency Department and triage nurse.  Should you have questions after your visit or need to cancel or reschedule your appointment, please contact [facility] - A DEPT OF [facility]  [phone] and follow the prompts.  Office hours are 8:00 a.m. to 4:30 p.m. Monday - Friday. Please note that voicemails left after 4:00 p.m. may not be returned until the following business day.  We are closed weekends and major holidays. You have access to a nurse at all times for urgent questions. Please call the main number to the clinic [phone] and follow the prompts.  For any non-urgent questions, you may also contact your provider using MyChart. We now offer e-Visits for anyone 72 and older to request care online for non-urgent symptoms. For details visit [email or web address].   Also download the MyChart app! Go to the app store, search MyChart, open the app, select [facility], and log in with your MyChart username and password.  Leucovorin  Injection What is this medication? LEUCOVORIN  (loo koe VOR in) prevents side effects from certain medications, such as methotrexate. It works by increasing folate levels. This helps protect healthy cells in your body. It may also be used to treat anemia caused by low levels of folate. It can also be used with fluorouracil , a type of chemotherapy, to treat colorectal cancer. It works by increasing the effects of fluorouracil  in the body. This medicine may be used for other purposes; ask your health care provider or pharmacist if you have questions. What should  I tell my care team before I take this medication? They need to know if you have any of these conditions: Anemia from low levels of vitamin B12 in the blood An unusual or allergic reaction to leucovorin , folic acid , other medications, foods, dyes, or preservatives Pregnant or trying to get pregnant Breastfeeding How should I use  this medication? This medication is injected into a vein or a muscle. It is given by your care team in a hospital or clinic setting. Talk to your care team about the use of this medication in children. Special care may be needed. Overdosage: If you think you have taken too much of this medicine contact a poison control center or emergency room at once. NOTE: This medicine is only for you. Do not share this medicine with others. What if I miss a dose? Keep appointments for follow-up doses. It is important not to miss your dose. Call your care team if you are unable to keep an appointment. What may interact with this medication? Capecitabine Fluorouracil  Phenobarbital Phenytoin Primidone Trimethoprim;sulfamethoxazole This list may not describe all possible interactions. Give your health care provider a list of all the medicines, herbs, non-prescription drugs, or dietary supplements you use. Also tell them if you smoke, drink alcohol, or use illegal drugs. Some items may interact with your medicine. What should I watch for while using this medication? Your condition will be monitored carefully while you are receiving this medication. This medication may increase the side effects of 5-fluorouracil . Tell your care team if you have diarrhea or mouth sores that do not get better or that get worse. What side effects may I notice from receiving this medication? Side effects that you should report to your care team as soon as possible: Allergic reactions--skin rash, itching, hives, swelling of the face, lips, tongue, or throat This list may not describe all possible side effects. Call your doctor for medical advice about side effects. You may report side effects to FDA at 1-800-FDA-1088. Where should I keep my medication? This medication is given in a hospital or clinic. It will not be stored at home. NOTE: This sheet is a summary. It may not cover all possible information. If you have questions about  this medicine, talk to your doctor, pharmacist, or health care provider.  2024 Elsevier/Gold Standard (2021-06-01 00:00:00)  Fluorouracil  Injection What is this medication? FLUOROURACIL  (flure oh YOOR a sil) treats some types of cancer. It works by slowing down the growth of cancer cells. This medicine may be used for other purposes; ask your health care provider or pharmacist if you have questions. COMMON BRAND NAME(S): Adrucil  What should I tell my care team before I take this medication? They need to know if you have any of these conditions: Blood disorders Dihydropyrimidine dehydrogenase (DPD) deficiency Infection, such as chickenpox, cold sores, herpes Kidney disease Liver disease Poor nutrition Recent or ongoing radiation therapy An unusual or allergic reaction to fluorouracil , other medications, foods, dyes, or preservatives If you or your partner are pregnant or trying to get pregnant Breast-feeding How should I use this medication? This medication is injected into a vein. It is administered by your care team in a hospital or clinic setting. Talk to your care team about the use of this medication in children. Special care may be needed. Overdosage: If you think you have taken too much of this medicine contact a poison control center or emergency room at once. NOTE: This medicine is only for you. Do not share this medicine  with others. What if I miss a dose? Keep appointments for follow-up doses. It is important not to miss your dose. Call your care team if you are unable to keep an appointment. What may interact with this medication? Do not take this medication with any of the following: Live virus vaccines This medication may also interact with the following: Medications that treat or prevent blood clots, such as warfarin, enoxaparin , dalteparin This list may not describe all possible interactions. Give your health care provider a list of all the medicines, herbs,  non-prescription drugs, or dietary supplements you use. Also tell them if you smoke, drink alcohol, or use illegal drugs. Some items may interact with your medicine. What should I watch for while using this medication? Your condition will be monitored carefully while you are receiving this medication. This medication may make you feel generally unwell. This is not uncommon as chemotherapy can affect healthy cells as well as cancer cells. Report any side effects. Continue your course of treatment even though you feel ill unless your care team tells you to stop. In some cases, you may be given additional medications to help with side effects. Follow all directions for their use. This medication may increase your risk of getting an infection. Call your care team for advice if you get a fever, chills, sore throat, or other symptoms of a cold or flu. Do not treat yourself. Try to avoid being around people who are sick. This medication may increase your risk to bruise or bleed. Call your care team if you notice any unusual bleeding. Be careful brushing or flossing your teeth or using a toothpick because you may get an infection or bleed more easily. If you have any dental work done, tell your dentist you are receiving this medication. Avoid taking medications that contain aspirin , acetaminophen , ibuprofen, naproxen, or ketoprofen unless instructed by your care team. These medications may hide a fever. Do not treat diarrhea with over the counter products. Contact your care team if you have diarrhea that lasts more than 2 days or if it is severe and watery. This medication can make you more sensitive to the sun. Keep out of the sun. If you cannot avoid being in the sun, wear protective clothing and sunscreen. Do not use sun lamps, tanning beds, or tanning booths. Talk to your care team if you or your partner wish to become pregnant or think you might be pregnant. This medication can cause serious birth defects if  taken during pregnancy and for 3 months after the last dose. A reliable form of contraception is recommended while taking this medication and for 3 months after the last dose. Talk to your care team about effective forms of contraception. Do not father a child while taking this medication and for 3 months after the last dose. Use a condom while having sex during this time period. Do not breastfeed while taking this medication. This medication may cause infertility. Talk to your care team if you are concerned about your fertility. What side effects may I notice from receiving this medication? Side effects that you should report to your care team as soon as possible: Allergic reactions--skin rash, itching, hives, swelling of the face, lips, tongue, or throat Heart attack--pain or tightness in the chest, shoulders, arms, or jaw, nausea, shortness of breath, cold or clammy skin, feeling faint or lightheaded Heart failure--shortness of breath, swelling of the ankles, feet, or hands, sudden weight gain, unusual weakness or fatigue Heart rhythm changes--fast or irregular  heartbeat, dizziness, feeling faint or lightheaded, chest pain, trouble breathing High ammonia level--unusual weakness or fatigue, confusion, loss of appetite, nausea, vomiting, seizures Infection--fever, chills, cough, sore throat, wounds that don't heal, pain or trouble when passing urine, general feeling of discomfort or being unwell Low red blood cell level--unusual weakness or fatigue, dizziness, headache, trouble breathing Pain, tingling, or numbness in the hands or feet, muscle weakness, change in vision, confusion or trouble speaking, loss of balance or coordination, trouble walking, seizures Redness, swelling, and blistering of the skin over hands and feet Severe or prolonged diarrhea Unusual bruising or bleeding Side effects that usually do not require medical attention (report to your care team if they continue or are  bothersome): Dry skin Headache Increased tears Nausea Pain, redness, or swelling with sores inside the mouth or throat Sensitivity to light Vomiting This list may not describe all possible side effects. Call your doctor for medical advice about side effects. You may report side effects to FDA at 1-800-FDA-1088. Where should I keep my medication? This medication is given in a hospital or clinic. It will not be stored at home. NOTE: This sheet is a summary. It may not cover all possible information. If you have questions about this medicine, talk to your doctor, pharmacist, or health care provider.  2024 Elsevier/Gold Standard (2021-05-04 00:00:00)

## 2023-12-15 NOTE — Telephone Encounter (Signed)
 Last office visit 10/24/2023 for hospital follow up.   Last refilled 11/17/2023 for #30 with no refills. Next Appt: 12/19/2023 for hospital follow up.

## 2023-12-15 NOTE — Progress Notes (Signed)
 Patient would like refill on his medication. He states after first treatment he developed mouth sores & he is concerned about them coming back since he will be getting treatment today.

## 2023-12-15 NOTE — Telephone Encounter (Addendum)
 Per Dr. Melanee He has iron  deficiency anemia. If you are able to reach him please ask him if he is agreeable to come for iv iron  infusions.  Outbound call; voice message left.

## 2023-12-15 NOTE — Telephone Encounter (Signed)
-----   Message from Annah JAYSON Skene sent at 12/15/2023 11:57 AM EST ----- He has iron  deficiency anemia. If you are able to reach him please ask him if he is agreeable to come for iv iron  infusions ----- Message ----- From: Interface, Lab In [place] Sent: 12/15/2023  11:01 AM EST To: Annah JAYSON Skene, MD

## 2023-12-15 NOTE — Progress Notes (Signed)
 I connected with Erik Martinez on 12/15/23 at  9:00 AM EST by video enabled telemedicine visit and verified that I am speaking with the correct person using two identifiers.   I discussed the limitations, risks, security and privacy concerns of performing an evaluation and management service by telemedicine and the availability of in-person appointments. I also discussed with the patient that there may be a patient responsible charge related to this service. The patient expressed understanding and agreed to proceed.  Other persons participating in the visit and their role in the encounter:  none  Patient's location:  cancer center Provider's location:  home  Chief Complaint: Assessment on treatment assessment prior to cycle 2 of adjuvant 5fu chemotherapy   Cancer Staging  Colon cancer [facility]) Staging form: Colon and Rectum, AJCC 8th Edition - Pathologic stage from 09/26/2023: Stage IIB (pT4a, pN0, cM0) - Signed by Erik Annah BROCKS, MD on 09/26/2023 Total positive nodes: 0 Histologic grading system: 4 grade system Histologic grade (G): G2 Residual tumor (R): R0    History of present illness: Patient is a 68 year old male who underwent colonoscopy on 08/25/2023 which showed a partially obstructing mass in the transverse colon.  This was biopsied and consistent with colon adenocarcinoma.  Upper endoscopy was normal.  Patient underwent CT chest abdomen and pelvis with contrast on 08/25/2023 which showed circumferential luminal narrowing of the colon at the hepatic flexure consistent with primary colonic neoplasm.  No evidence of metastatic adenopathy in the abdomen and pelvis.  No evidence of liver metastases.  No evidence of intrathoracic metastases.  Small nodules around the oblique seizures favored to be benign.Preoperative CEA was mildly elevated at 6.6.   Patient underwent right hemicolectomy on 09/14/2023 which showed invasive adenocarcinoma moderately differentiated 4 cm focally invading visceral  peritoneum.  Margins negative.  23 examined lymph nodes negative.  No evidence of lymphovascular invasion.  Tumor budding score moderate at 5.  pT4 N0. MSI stable.  Signatera testing was negative.  However decision made to offer single agent 5-FU chemotherapy for 6 months given T4 disease.  Oxaliplatin omitted due to pre-existing neuropathy.  DY PD testing normal    Interval history patient last received chemotherapy about 6 weeks ago.  Patient reports that he had a hard time with mucositis which ultimately resolved after a course of steroids.  Today he overall he feels better.  He has ongoing symptoms of anxiety and has asked us  multiple times to refill his Valium  which he takes once at night   Review of Systems  Constitutional:  Positive for malaise/fatigue. Negative for chills, fever and weight loss.  HENT:  Negative for congestion, ear discharge and nosebleeds.   Eyes:  Negative for blurred vision.  Respiratory:  Negative for cough, hemoptysis, sputum production, shortness of breath and wheezing.   Cardiovascular:  Negative for chest pain, palpitations, orthopnea and claudication.  Gastrointestinal:  Negative for abdominal pain, blood in stool, constipation, diarrhea, heartburn, melena, nausea and vomiting.  Genitourinary:  Negative for dysuria, flank pain, frequency, hematuria and urgency.  Musculoskeletal:  Negative for back pain, joint pain and myalgias.  Skin:  Negative for rash.  Neurological:  Negative for dizziness, tingling, focal weakness, seizures, weakness and headaches.  Endo/Heme/Allergies:  Does not bruise/bleed easily.  Psychiatric/Behavioral:  Negative for depression and suicidal ideas. The patient is nervous/anxious. The patient does not have insomnia.     Allergies  Allergen Reactions   Suboxone [Buprenorphine Hcl-Naloxone  Hcl] Nausea And Vomiting   Tape Other (See Comments)  Whelps *Paper Tape is ok    Codeine Rash, Itching and Nausea Only   Morphine  And Codeine  Itching and Rash   Other Rash    Telemetry electrodes   Silicone Rash    Whelps -*Paper Tape is ok     Past Medical History:  Diagnosis Date   (HFpEF) heart failure with preserved ejection fraction (HCC)    Adenocarcinoma, colon (HCC) 08/25/2023   Anemia    Anxiety    Aortic atherosclerosis    Arthritis    Avascular necrosis of bones of both hips (HCC)    Bipolar disorder (HCC)    Bladder spasms    Chronic pain of left knee    Chronic pain syndrome    a.) followed by pain clinic in [place]   Chronic, continuous use of opioids    a.) managed by pain clinic in [place]   DDD (degenerative disc disease)    Depression    Erectile dysfunction    a.) on PDE5i (tadalafil)   Frequency-urgency syndrome    GERD (gastroesophageal reflux disease)    Hepatitis C 2018   History of anesthesia complications    a.) PONV; b.) PDPH; c.) difficult to sedate   History of hepatitis B    1983  TX'D--  NO ISSUES OR SYMPTOMS SINCE   Hyperlipidemia    Hypotestosteronemia    Insomnia    a.) on hypnotic PRN (zolpidem )   Multiple lung nodules on CT    a.) CT CAP 08/25/2023: along oblique fissures - favored to be benign   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   Neurogenic bladder    Nocturia    PAD (peripheral artery disease)    Polysubstance abuse (HCC)    a.) cocaine + marijuana + BZO + opioids   PONV (postoperative nausea and vomiting)    Spinal headache    T2DM (type 2 diabetes mellitus) (HCC)    Urine incontinence     Past Surgical History:  Procedure Laterality Date   APPLICATION OF WOUND VAC Left 09/11/2021   Procedure: APPLICATION OF WOUND VAC;  Surgeon: [name], MD;  Location: [facility] ORS;  Service: Orthopedics;  Laterality: Left;  [id number]    BACK SURGERY     2 rods and 4 screws artificial disc   COLONOSCOPY  2008   COLONOSCOPY N/A 08/25/2023   Procedure: COLONOSCOPY;  Surgeon: [name], MD;  Location[facility] END[facility]COPY;  Service: Endoscopy;  Laterality: N/A;    ESOPHAGOGASTRODUODENOSCOPY N/A 08/25/2023   Procedure: EGD (ESOPHAGOGASTRODUODENOSCOPY);  Surgeon: [name], MD;  Location: [facility] ENDOSCOPY;  Service: Endoscopy;  Laterality: N/A;   INTERSTIM IMPLANT PLACEMENT  2007   INTERSTIM IMPLANT REVISION N/A 08/17/2012   Procedure: REPLACMENT OF IPG PLUS REPLACE LEAD OF INTERSTIM IMPLANT ;  Surgeon: [name], MD;  Location: [facility];  Service: Urology;  Laterality: N/A;   IRRIGATION AND DEBRIDEMENT KNEE Left 09/11/2021   Procedure: IRRIGATION AND DEBRIDEMENT WITH POLY EXCHANGE LEFT KNEE;  Surgeon: [name], MD;  Location: [facility] ORS;  Service: Orthopedics;  Laterality: Left;   IRRIGATION AND DEBRIDEMENT KNEE Left 06/01/2022   Procedure: IRRIGATION AND DEBRIDEMENT KNEE;  Surgeon: [name], MD;  Location: [facility] ORS;  Service: Orthopedics;  Laterality: Left;   LOWER EXTREMITY ANGIOGRAPHY Left 11/17/2020   Procedure: LOWER EXTREMITY ANGIOGRAPHY;  Surgeon: [name], MD;  Location: [facility] INVASIVE CV LAB;  Service: Cardiovascular;  Laterality: Left;   LOWER EXTREMITY ANGIOGRAPHY Right 12/22/2020   Procedure: LOWER EXTREMITY ANGIOGRAPHY;  Surgeon: [name], MD;  Location: [facility] INVASIVE CV LAB;  Service: Cardiovascular;  Laterality: Right;   LOWER EXTREMITY ANGIOGRAPHY Left 12/14/2021   Procedure: Lower Extremity Angiography;  Surgeon: [name], MD;  Location: [facility] INVASIVE CV LAB;  Service: Cardiovascular;  Laterality: Left;   LUMBAR DISC SURGERY  2005   L5   LUMBAR FUSION  X2  2006  &  2007   MANIPULATION KNEE JOINT Left 08/2002   OPEN DEBRIDEMENT LEFT TOTAL KNEE (SCAR, BONEY GRAOWTH)/ REMOVAL OLD SUTURES  05/01/2007   PARTIAL HIP ARTHROPLASTY Left 2001   x2   PORTACATH PLACEMENT N/A 10/27/2023   Procedure: INSERTION, TUNNELED CENTRAL VENOUS DEVICE, WITH PORT;  Surgeon: Tye Millet, DO;  Location: [facility] ORS;  Service: General;  Laterality: N/A;   SHOULDER OPEN ROTATOR CUFF REPAIR Left 2000    TOTAL HIP ARTHROPLASTY Left 2002   TOTAL KNEE ARTHROPLASTY Left 06/2002   PARTIAL LEFT KNEE REPLACEMENT PRIOR TO THIS   TOTAL KNEE REVISION Left 2008   TOTAL KNEE REVISION Left 07/19/2021   Procedure: TOTAL KNEE REVISION;  Surgeon: [name], MD;  Location: [facility] ORS;  Service: Orthopedics;  Laterality: Left;   TOTAL KNEE REVISION Left 2005   TOTAL KNEE REVISION Left 06/01/2022   Procedure: TOTAL KNEE REVISION;  Surgeon: [name], MD;  Location: [facility] OR[facility];  Service: Orthopedics;  Laterality: Left;   TOTAL KNEE REVISION Left 04/01/2022   Procedure: Removal of left total knee implants with insertion of spacer.;  Surgeon: [name], MD;  Location: [facility] ORS;  Service: Orthopedics;  Laterality: Left;   TRIGGER FINGER RELEASE Bilateral    SEVERAL FINGERS   TRIGGER FINGER RELEASE Right 06/18/2014   Procedure: RELEASE TRIGGER FINGER/A-1 PULLEY;  Surgeon: Lynwood SQUIBB Mardee, MD;  Location: [facility] OR[facility];  Service: Orthopedics;  Laterality: Right;    Social History   Socioeconomic History   Marital status: Divorced    Spouse name: Not on file   Number of children: 3   Years of education: Not on file   Highest education level: Some college, no degree  Occupational History   Not on file  Tobacco Use   Smoking status: Never   Smokeless tobacco: Never  Vaping Use   Vaping status: Never Used  Substance and Sexual Activity   Alcohol use: Not Currently    Alcohol/week: 0.0 - 2.0 standard drinks of alcohol   Drug use: Not Currently    Types: Cocaine    Comment: last + cocaine UDS 07/2016   Sexual activity: Not Currently    Birth control/protection: None  Other Topics Concern   Not on file  Social History Narrative    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   Overall Financial Resource Strain (CARDIA)    Difficulty of Paying Living Expenses: Not hard at all  Food  Insecurity: No Food Insecurity (12/14/2023)   Hunger Vital Sign    Worried About Running Out of Food in the Last Year: Never true    Ran Out of Food in the Last Year: Never true  Recent Concern: Food Insecurity - Food Insecurity Present (12/10/2023)   Hunger Vital Sign    Worried About Running Out of Food in the Last Year: Sometimes true    Ran Out of Food in the Last Year: Never true  Transportation Needs: No Transportation Needs (12/14/2023)  PRAPARE - Administrator, Civil Service (Medical): No    Lack of Transportation (Non-Medical): No  Physical Activity: Inactive (01/28/2022)   Exercise Vital Sign    Days of Exercise per Week: 0 days    Minutes of Exercise per Session: 0 min  Stress: No Stress Concern Present (01/28/2022)   [employer] of Occupational Health - Occupational Stress Questionnaire    Feeling of Stress : Not at all  Social Connections: Socially Isolated (12/10/2023)   Social Connection and Isolation Panel    Frequency of Communication with Friends and Family: More than three times a week    Frequency of Social Gatherings with Friends and Family: Once a week    Attends Religious Services: Never    Database Administrator or Organizations: No    Attends Banker Meetings: Never    Marital Status: Divorced  Catering Manager Violence: Not At Risk (12/14/2023)   Humiliation, Afraid, Rape, and Kick questionnaire    Fear of Current or Ex-Partner: No    Emotionally Abused: No    Physically Abused: No    Sexually Abused: No    Family History  Problem Relation Age of Onset   Diabetes Mother    Arthritis Mother    Cancer Mother        lung cancer   Hyperlipidemia Mother    Stroke Mother    Alcohol abuse Father    Cancer Father        lung   Arthritis Father    Diabetes Brother      Current Outpatient Medications:    amLODipine  (NORVASC ) 5 MG tablet, Take 1 tablet (5 mg total) by mouth daily., Disp: 180 tablet, Rfl: 3   atorvastatin   (LIPITOR) 20 MG tablet, Take 20 mg by mouth at bedtime., Disp: , Rfl:    celecoxib  (CELEBREX ) 200 MG capsule, Take 1 capsule (200 mg total) by mouth 2 (two) times daily., Disp: 180 capsule, Rfl: 0   Continuous Glucose Sensor (FREESTYLE LIBRE 3 PLUS SENSOR) MISC, CHANCE SENSOR EVERY 15 DAYS, Disp: 2 each, Rfl: 3   cyclobenzaprine  (FLEXERIL ) 10 MG tablet, Take 10 mg by mouth 3 (three) times daily., Disp: , Rfl:    dexamethasone  (DECADRON ) 0.5 MG/5ML solution, Take 10 mLs (1 mg total) by mouth in the morning, at noon, in the evening, and at bedtime. Swish 10 ml (1 mg total) of solution in mouth for 2 minutes then spit. Repeat every 6 hours. Do not swallow. Do not take anything by mouth for 1 hour after taking. For mucositis., Disp: 280 mL, Rfl: 0   empagliflozin  (JARDIANCE ) 25 MG TABS tablet, Take 1 tablet (25 mg total) by mouth daily., Disp: 90 tablet, Rfl: 1   fentaNYL  (DURAGESIC ) 25 MCG/HR, Place 1 patch onto the skin every 3 (three) days., Disp: , Rfl:    furosemide  (LASIX ) 40 MG tablet, Take 1 tablet (40 mg total) by mouth daily. Take at least 1 tab daily.  If having a high salt meal or you notice that you are becoming more edematous take a second dose in the evening, Disp: 30 tablet, Rfl: 0   insulin  aspart (NOVOLOG ) 100 UNIT/ML injection, Per sliding scale  MAX 15 units in 24 hour period., Disp: 10 mL, Rfl: PRN   iron  polysaccharides (NIFEREX) 150 MG capsule, Take 1 capsule (150 mg total) by mouth daily., Disp: 30 capsule, Rfl: 0   lidocaine  (LIDODERM ) 5 %, Place 3 patches onto the skin daily. Remove & Discard  patch within 12 hours or as directed by MD, Disp: 90 patch, Rfl: 0   lidocaine -prilocaine  (EMLA ) cream, Apply to affected area once, Disp: 30 g, Rfl: 3   lubiprostone  (AMITIZA ) 8 MCG capsule, Take 1 capsule (8 mcg total) by mouth 3 (three) times daily., Disp: 90 capsule, Rfl: 1   magic mouthwash w/lidocaine  SOLN, Take 5 mLs by mouth 4 (four) times daily as needed for mouth pain. swish and  swallow, Disp: 480 mL, Rfl: 3   naloxone  (NARCAN ) nasal spray 4 mg/0.1 mL, Take in case of opioid overdose, Disp: 2 each, Rfl: 0   ondansetron  (ZOFRAN ) 8 MG tablet, Take 1 tablet (8 mg total) by mouth every 8 (eight) hours as needed for nausea or vomiting., Disp: 30 tablet, Rfl: 1   Oxycodone  HCl 20 MG TABS, Take 1 tablet by mouth every 4 (four) hours as needed (Pain)., Disp: , Rfl:    pantoprazole  (PROTONIX ) 40 MG tablet, Take 1 tablet (40 mg total) by mouth daily., Disp: 90 tablet, Rfl: 3   polyethylene glycol (MIRALAX  / GLYCOLAX ) 17 g packet, Take 17 g by mouth daily as needed for mild constipation., Disp: 14 each, Rfl: 0   potassium chloride  SA (KLOR-CON  M) 20 MEQ tablet, Take 1 tablet (20 mEq total) by mouth daily., Disp: 90 tablet, Rfl: 3   pregabalin  (LYRICA ) 200 MG capsule, Take 1 capsule (200 mg total) by mouth in the morning, at noon, and at bedtime., Disp: 90 capsule, Rfl: 0   prochlorperazine  (COMPAZINE ) 10 MG tablet, Take 1 tablet (10 mg total) by mouth every 6 (six) hours as needed for nausea or vomiting., Disp: 30 tablet, Rfl: 1   Semaglutide , 2 MG/DOSE, (OZEMPIC , 2 MG/DOSE,) 8 MG/3ML SOPN, Inject 2 mg into the skin once a week. Monday, Disp: , Rfl:    spironolactone  (ALDACTONE ) 25 MG tablet, Take 1 tablet (25 mg total) by mouth daily., Disp: 90 tablet, Rfl: 3   tadalafil (CIALIS) 20 MG tablet, Take 20 mg by mouth daily as needed for erectile dysfunction., Disp: , Rfl:    terbinafine  (LAMISIL ) 250 MG tablet, TAKE 1 TABLET BY MOUTH ONCE DAILY, Disp: 90 tablet, Rfl: 0   triamcinolone  cream (KENALOG ) 0.5 %, Apply 1 Application topically 2 (two) times daily., Disp: 30 g, Rfl: 0   zolpidem  (AMBIEN ) 10 MG tablet, Take 1 tablet (10 mg total) by mouth at bedtime., Disp: 30 tablet, Rfl: 0   diazepam  (VALIUM ) 5 MG tablet, Take 1 tablet (5 mg total) by mouth at bedtime., Disp: 14 tablet, Rfl: 0 No current facility-administered medications for this visit.  Facility-Administered Medications  Ordered in Other Visits:    0.9 %  sodium chloride  infusion, , Intravenous, Continuous, Erik Annah BROCKS, MD, Stopped at 12/15/23 1048   fluorouracil  (ADRUCIL ) 3,900 mg in sodium chloride  0.9 % 72 mL chemo infusion, 2,000 mg/m2 (Treatment Plan Recorded), Intravenous, 1 day or 1 dose, Erik Annah BROCKS, MD, Infusion Verify at 12/15/23 1053  DG Chest 2 View Result Date: 12/09/2023 EXAM: 2 VIEW(S) XRAY OF THE CHEST 12/09/2023 07:09:00 PM COMPARISON: 10/27/2023 CLINICAL HISTORY: SOB FINDINGS: LINES, TUBES AND DEVICES: Right chest port in place. LUNGS AND PLEURA: Pulmonary interstitial prominence worsen compared to 10 / 17 / 25. Patchy bibasilar airspace opacities. Small bilateral pleural effusions. No pneumothorax. HEART AND MEDIASTINUM: Cardiomegaly. Calcified aorta. BONES AND SOFT TISSUES: No acute osseous abnormality. IMPRESSION: 1. Interstitial and alveolar edema with small bilateral pleural effusions. Electronically signed by: Norman Gatlin MD 12/09/2023 07:14 PM EST RP Workstation: [id number]  No images are attached to the encounter.      Latest Ref Rng & Units 12/15/2023    8:40 AM  CMP  Glucose 70 - 99 mg/dL 696   BUN 8 - 23 mg/dL 24   Creatinine 9.38 - 1.24 mg/dL 8.91   Sodium 864 - 854 mmol/L 131   Potassium 3.5 - 5.1 mmol/L 3.9   Chloride 98 - 111 mmol/L 94   CO2 22 - 32 mmol/L 28   Calcium  8.9 - 10.3 mg/dL 8.5   Total Protein 6.5 - 8.1 g/dL 7.5   Total Bilirubin 0.0 - 1.2 mg/dL 0.4   Alkaline Phos 38 - 126 U/L 106   AST 15 - 41 U/L 20   ALT 0 - 44 U/L 8       Latest Ref Rng & Units 12/15/2023    8:40 AM  CBC  WBC 4.0 - 10.5 K/uL 8.4   Hemoglobin 13.0 - 17.0 g/dL 8.3   Hematocrit 60.9 - 52.0 % 28.1   Platelets 150 - 400 K/uL 243      Observation/objective: Appears in no acute distress over video visit today.  Breathing is nonlabored  Assessment and plan: Patient is a 68 year old male with history of stage II pT4a N0 M0 adenocarcinoma of the colon s/p surgery here for on  treatment assessment prior to cycle 2 of adjuvant 5-FU chemotherapy  Patient's last treatment was 6 weeks ago and subsequently patient developed mucositis and was hesitant to come back to restart chemotherapy.  Overall he feels better today and is agreeable to proceeding with treatment today.  DY PD testing was normal.  No evidence of neutropenia.  I am planning to omit 5-FU injection today and reduce infusional 5-FU dose to 2000 mg/m  If patient is unable to stay on schedule and get his treatments every 2 weeks there will be no appointment continuing adjuvant chemotherapy with multiple interruptions.  I also gave him the alternative to see if Xeloda would be a better option but it does carry risk of dermatitis and diarrhea nausea and patient does not wish to try Xeloda.  I will see him back in 2 weeks for cycle 3 of adjuvant 5-FU chemotherapy.  I have given him a 2-week supply of Valium  at this time to get him through the anxiety while going through chemotherapy but I have explained to him clearly that I cannot prescribe him anxiolytics long-term  Follow-up instructions:as above  I discussed the assessment and treatment plan with the patient. The patient was provided an opportunity to ask questions and all were answered. The patient agreed with the plan and demonstrated an understanding of the instructions.   The patient was advised to call back or seek an in-person evaluation if the symptoms worsen or if the condition fails to improve as anticipated.  I provided 20 minutes of face-to-face video visit time during this encounter, and > 50% was spent counseling as documented under my assessment & plan.  Visit Diagnosis: 1. Malignant neoplasm of colon, unspecified part of colon (HCC)   2. Encounter for antineoplastic chemotherapy   3. GAD (generalized anxiety disorder)     Dr. Annah Skene, MD, MPH [facility] at [facility] Tel- [phone] 12/15/2023 12:54 PM

## 2023-12-18 ENCOUNTER — Inpatient Hospital Stay

## 2023-12-19 ENCOUNTER — Ambulatory Visit: Admitting: Family Medicine

## 2023-12-19 ENCOUNTER — Encounter: Payer: Self-pay | Admitting: Family Medicine

## 2023-12-19 ENCOUNTER — Encounter (HOSPITAL_COMMUNITY): Payer: Self-pay

## 2023-12-19 VITALS — BP 124/70 | HR 99 | Temp 98.4°F | Ht 69.0 in | Wt 189.1 lb

## 2023-12-19 DIAGNOSIS — I5032 Chronic diastolic (congestive) heart failure: Secondary | ICD-10-CM | POA: Insufficient documentation

## 2023-12-19 DIAGNOSIS — E1142 Type 2 diabetes mellitus with diabetic polyneuropathy: Secondary | ICD-10-CM

## 2023-12-19 DIAGNOSIS — C19 Malignant neoplasm of rectosigmoid junction: Secondary | ICD-10-CM

## 2023-12-19 MED ORDER — PREDNISONE 10 MG PO TABS: ORAL_TABLET | ORAL | 0 refills | Status: DC

## 2023-12-19 NOTE — Assessment & Plan Note (Signed)
 Chronic, recently well-controlled on current regimen but blood sugar was elevated when in hospital.  Get back on track with low-carb diet.  Ozempic  2 mg weekly.  Metformin  1000 mg BID  jardiance  25 mg daily  Insulin  Novolog   per Sliding scale

## 2023-12-19 NOTE — Assessment & Plan Note (Addendum)
 Active treatment per oncology Recent chemotherapy infusion now with recurrent oral ulcers.  No sign of infection in mouth. In the past this was treated by oncologist with steroid injection which helped dramatically. He states Magic mouthwash and topical Anbesol not helping and he is having significantly limited diet because of the pain from the ulcers. Will try prednisone  taper low-dose 30/20/10 over 7-day period. While on prednisone  he will follow blood sugars closely.  Also while patient was in the office note , a note was written for his RV mortgage describing current healthcare issues.

## 2023-12-19 NOTE — Progress Notes (Signed)
 Patient ID: Erik Martinez, male    DOB: 30-Jul-1955, 68 y.o.   MRN: [id number]  This visit was conducted in person.  BP 124/70   Pulse 99   Temp 98.4 F (36.9 C) (Temporal)   Ht 5' 9 (1.753 m)   Wt 189 lb 2 oz (85.8 kg)   SpO2 97%   BMI 27.93 kg/m    CC:  Chief Complaint  Patient presents with   Hospitalization Follow-up    Subjective:   HPI: Erik Martinez is a 68 y.o. male presenting on 12/19/2023 for Hospitalization Follow-up  Recent hospitalization Admitted 12/09/2023 Discharged 12/12/2023 follow-up note  Admitted with worsening dyspnea lower extremity edema and orthopnea. Had recently been seen at the CHF clinic and torsemide  had been increased.  On admission proBNP was over 10,000 CBC showed microcytic anemia. Echo 50 to 55% grade 1 diastolic dysfunction. Started on IV diuresis. Had 8 L of diuresis.  Did develop mild AKI ambulation to diuresis. Was back at baseline on discharge from hospital.  At discharge recommended resuming 40 mg Lasix  at home. Continue Jardiance  and spironolactone .   Recommended follow-up with oncology and cardiology as scheduled.   Today he reports stable weight.  Still slight decrease in SOB.   Sores in mouth from chemo. Using magic mouthwash.  He is having to eat mashed potatos,  He feels that steroids helped in past.   Wt Readings from Last 3 Encounters:  12/19/23 189 lb 2 oz (85.8 kg)  12/15/23 184 lb 4.8 oz (83.6 kg)  12/12/23 190 lb 0.6 oz (86.2 kg)   Needs letter stating  hardship on RV mortgage and loan... needs to state he is currently being treat for CHF and colon cancer.. undergoing chemotherapy.  Has been admitted to hospital in last 2 months. Has decreased salt.  He is now weight  2 times a day.    Wt Readings from Last 3 Encounters:  12/19/23 189 lb 2 oz (85.8 kg)  12/15/23 184 lb 4.8 oz (83.6 kg)  12/12/23 190 lb 0.6 oz (86.2 kg)     Drinking   water  and zero ginger ales.   Blood sugar in fasting each Am   160-170 Lab Results  Component Value Date   HGBA1C 7.5 (A) 10/24/2023    Relevant past medical, surgical, family and social history reviewed and updated as indicated. Interim medical history since our last visit reviewed. Allergies and medications reviewed and updated. Outpatient Medications Prior to Visit  Medication Sig Dispense Refill   amLODipine  (NORVASC ) 5 MG tablet Take 1 tablet (5 mg total) by mouth daily. 180 tablet 3   atorvastatin  (LIPITOR) 20 MG tablet Take 20 mg by mouth at bedtime.     celecoxib  (CELEBREX ) 200 MG capsule Take 1 capsule (200 mg total) by mouth 2 (two) times daily. 180 capsule 0   Continuous Glucose Sensor (FREESTYLE LIBRE 3 PLUS SENSOR) MISC CHANCE SENSOR EVERY 15 DAYS 2 each 3   cyclobenzaprine  (FLEXERIL ) 10 MG tablet Take 10 mg by mouth 3 (three) times daily.     dexamethasone  (DECADRON ) 0.5 MG/5ML solution Take 10 mLs (1 mg total) by mouth in the morning, at noon, in the evening, and at bedtime. Swish 10 ml (1 mg total) of solution in mouth for 2 minutes then spit. Repeat every 6 hours. Do not swallow. Do not take anything by mouth for 1 hour after taking. For mucositis. 280 mL 0   diazepam  (VALIUM ) 5 MG tablet Take 1 tablet (5  mg total) by mouth at bedtime. 14 tablet 0   empagliflozin  (JARDIANCE ) 25 MG TABS tablet Take 1 tablet (25 mg total) by mouth daily. 90 tablet 1   fentaNYL  (DURAGESIC ) 25 MCG/HR Place 1 patch onto the skin every 3 (three) days.     furosemide  (LASIX ) 40 MG tablet Take 1 tablet (40 mg total) by mouth daily. Take at least 1 tab daily.  If having a high salt meal or you notice that you are becoming more edematous take a second dose in the evening 30 tablet 0   insulin  aspart (NOVOLOG ) 100 UNIT/ML injection Per sliding scale  MAX 15 units in 24 hour period. 10 mL PRN   iron  polysaccharides (NIFEREX) 150 MG capsule Take 1 capsule (150 mg total) by mouth daily. 30 capsule 0   lidocaine  (LIDODERM ) 5 % Place 3 patches onto the skin daily. Remove  & Discard patch within 12 hours or as directed by MD 90 patch 0   lidocaine -prilocaine  (EMLA ) cream Apply to affected area once 30 g 3   lubiprostone  (AMITIZA ) 8 MCG capsule Take 1 capsule (8 mcg total) by mouth 3 (three) times daily. 90 capsule 1   magic mouthwash w/lidocaine  SOLN Take 5 mLs by mouth 4 (four) times daily as needed for mouth pain. swish and swallow 480 mL 3   naloxone  (NARCAN ) nasal spray 4 mg/0.1 mL Take in case of opioid overdose 2 each 0   ondansetron  (ZOFRAN ) 8 MG tablet Take 1 tablet (8 mg total) by mouth every 8 (eight) hours as needed for nausea or vomiting. 30 tablet 1   Oxycodone  HCl 20 MG TABS Take 1 tablet by mouth every 4 (four) hours as needed (Pain).     pantoprazole  (PROTONIX ) 40 MG tablet Take 1 tablet (40 mg total) by mouth daily. 90 tablet 3   polyethylene glycol (MIRALAX  / GLYCOLAX ) 17 g packet Take 17 g by mouth daily as needed for mild constipation. 14 each 0   potassium chloride  SA (KLOR-CON  M) 20 MEQ tablet Take 1 tablet (20 mEq total) by mouth daily. 90 tablet 3   pregabalin  (LYRICA ) 200 MG capsule Take 1 capsule (200 mg total) by mouth in the morning, at noon, and at bedtime. 90 capsule 0   prochlorperazine  (COMPAZINE ) 10 MG tablet Take 1 tablet (10 mg total) by mouth every 6 (six) hours as needed for nausea or vomiting. 30 tablet 1   Semaglutide , 2 MG/DOSE, (OZEMPIC , 2 MG/DOSE,) 8 MG/3ML SOPN Inject 2 mg into the skin once a week. Monday     spironolactone  (ALDACTONE ) 25 MG tablet Take 1 tablet (25 mg total) by mouth daily. 90 tablet 3   tadalafil (CIALIS) 20 MG tablet Take 20 mg by mouth daily as needed for erectile dysfunction.     terbinafine  (LAMISIL ) 250 MG tablet TAKE 1 TABLET BY MOUTH ONCE DAILY 90 tablet 0   triamcinolone  cream (KENALOG ) 0.5 % Apply 1 Application topically 2 (two) times daily. 30 g 0   zolpidem  (AMBIEN ) 10 MG tablet TAKE 1 TABLET BY MOUTH AT BEDTIME 30 tablet 1   No facility-administered medications prior to visit.     Per HPI  unless specifically indicated in ROS section below Review of Systems  Constitutional:  Positive for fatigue. Negative for fever.  HENT:  Negative for ear pain.   Eyes:  Negative for pain.  Respiratory:  Negative for cough and shortness of breath.   Cardiovascular:  Negative for chest pain, palpitations and leg swelling.  Gastrointestinal:  Negative for abdominal pain.  Genitourinary:  Negative for dysuria.  Musculoskeletal:  Negative for arthralgias.  Neurological:  Negative for syncope, light-headedness and headaches.  Psychiatric/Behavioral:  Negative for dysphoric mood.    Objective:  BP 124/70   Pulse 99   Temp 98.4 F (36.9 C) (Temporal)   Ht 5' 9 (1.753 m)   Wt 189 lb 2 oz (85.8 kg)   SpO2 97%   BMI 27.93 kg/m   Wt Readings from Last 3 Encounters:  12/19/23 189 lb 2 oz (85.8 kg)  12/15/23 184 lb 4.8 oz (83.6 kg)  12/12/23 190 lb 0.6 oz (86.2 kg)      Physical Exam Constitutional:      Appearance: He is well-developed.  HENT:     Head: Normocephalic.     Right Ear: Hearing normal.     Left Ear: Hearing normal.     Nose: Nose normal.  Neck:     Thyroid : No thyroid  mass or thyromegaly.     Vascular: No carotid bruit.     Trachea: Trachea normal.  Cardiovascular:     Rate and Rhythm: Normal rate and regular rhythm.     Pulses: Normal pulses.     Heart sounds: Heart sounds not distant. No murmur heard.    No friction rub. No gallop.     Comments: No peripheral edema Pulmonary:     Effort: Pulmonary effort is normal. No respiratory distress.     Breath sounds: Normal breath sounds.  Musculoskeletal:     Comments: Of note left knee surgical sites small suture protruding, cleaned with alcohol and tried to remove without success.   Patient had discussed with orthopedics in the past who recommended just observing.  Skin:    General: Skin is warm and dry.     Findings: No rash.  Psychiatric:        Speech: Speech normal.        Behavior: Behavior normal.         Thought Content: Thought content normal.       Results for orders placed or performed in visit on 12/15/23  Iron  and TIBC   Collection Time: 12/15/23  8:40 AM  Result Value Ref Range   Iron  21 (L) 45 - 182 ug/dL   TIBC 589 749 - 549 ug/dL   Saturation Ratios 5 (L) 17.9 - 39.5 %   UIBC 390 ug/dL  Ferritin   Collection Time: 12/15/23  8:40 AM  Result Value Ref Range   Ferritin 30 24 - 336 ng/mL    Assessment and Plan  Type 2 diabetes mellitus with diabetic polyneuropathy, without long-term current use of insulin  (HCC) Assessment & Plan: Chronic, recently well-controlled on current regimen but blood sugar was elevated when in hospital.  Get back on track with low-carb diet.  Ozempic  2 mg weekly.  Metformin  1000 mg BID  jardiance  25 mg daily  Insulin  Novolog   per Sliding scale   Primary colorectal adenocarcinoma [facility]) Assessment & Plan: Active treatment per oncology Recent chemotherapy infusion now with recurrent oral ulcers.  No sign of infection in mouth. In the past this was treated by oncologist with steroid injection which helped dramatically. He states Magic mouthwash and topical Anbesol not helping and he is having significantly limited diet because of the pain from the ulcers. Will try prednisone  taper low-dose 30/20/10 over 7-day period. While on prednisone  he will follow blood sugars closely.  Also while patient was in the office note , a note was written  for his RV mortgage describing current healthcare issues.   Chronic heart failure with preserved ejection fraction (HFpEF) (HCC) Assessment & Plan: Chronic with recent admission to hospital for acute exacerbation.  Now patient euvolemic and at baseline on current diuretic regimen.  Lasix  40 mg p.o. daily Jardiance  25 mg p.o. daily Spironolactone  25 mg daily   Other orders -     predniSONE ; 3 tabs by mouth daily x 3 days, then 2 tabs by mouth daily x 2 days then 1 tab by mouth daily x 2 days  Dispense: 15  tablet; Refill: 0    No follow-ups on file.   Greig Ring, MD

## 2023-12-19 NOTE — Assessment & Plan Note (Signed)
 Chronic with recent admission to hospital for acute exacerbation.  Now patient euvolemic and at baseline on current diuretic regimen.  Lasix  40 mg p.o. daily Jardiance  25 mg p.o. daily Spironolactone  25 mg daily

## 2023-12-20 ENCOUNTER — Telehealth: Payer: Self-pay | Admitting: Family

## 2023-12-21 ENCOUNTER — Ambulatory Visit

## 2023-12-21 ENCOUNTER — Encounter: Payer: Self-pay | Admitting: Oncology

## 2023-12-21 ENCOUNTER — Telehealth: Payer: Self-pay

## 2023-12-21 NOTE — Telephone Encounter (Signed)
 Please advise:  Patient has mucositis that is extremely painful and not able to eat for the past 4 days.

## 2023-12-22 ENCOUNTER — Encounter: Payer: Self-pay | Admitting: Oncology

## 2023-12-22 MED ORDER — FUROSEMIDE 40 MG PO TABS: 40.0000 mg | ORAL_TABLET | Freq: Every day | ORAL | 1 refills | Status: AC

## 2023-12-22 NOTE — Telephone Encounter (Signed)
Meds sent to preferred pharmacy.  

## 2023-12-26 ENCOUNTER — Telehealth: Payer: Self-pay | Admitting: *Deleted

## 2023-12-26 ENCOUNTER — Telehealth: Payer: Self-pay | Admitting: Family

## 2023-12-26 NOTE — Telephone Encounter (Signed)
 1503-I attempted to reach patient again this afternoon to follow-up with his concerns. Left vm for patient to return my phone call. His vm immediately turns on when I call the number.

## 2023-12-26 NOTE — Telephone Encounter (Signed)
 Patient called at 1150 and spoke with Caroleen Pounds in scheduling. He stated that he has left vm and requested a return phone call to discuss his mouth sores. Tenitia explained to him that rns have documented that we have attempted to reach him back multiple times.  I personally attempted to reach him back again at 1210- I left another vm. Previoulsy tried to reach patient on 12/11 and 12/12- no reply back from patient.   Patient needs smc apts in clinic for further evaluation.

## 2023-12-26 NOTE — Telephone Encounter (Signed)
 Called to confirm/remind patient of their appointment [facility] on [facility].   Appointment:   [] Confirmed  [x] Left mess   [] No answer/No voice mail  [] VM Full/unable to leave message  [] Phone not in service  Patient reminded to bring all medications and/or complete list.  Confirmed patient has transportation. Gave directions, instructed to utilize valet parking.

## 2023-12-27 ENCOUNTER — Ambulatory Visit: Admitting: Family

## 2023-12-27 NOTE — Telephone Encounter (Signed)
 attempted to reach patient. no answer. call goes directly to vm. d

## 2023-12-28 ENCOUNTER — Other Ambulatory Visit: Payer: Self-pay | Admitting: Oncology

## 2023-12-29 ENCOUNTER — Encounter: Payer: Self-pay | Admitting: Oncology

## 2023-12-29 ENCOUNTER — Inpatient Hospital Stay

## 2023-12-29 ENCOUNTER — Inpatient Hospital Stay: Admitting: Oncology

## 2023-12-29 NOTE — Progress Notes (Signed)
 Nutrition  Patient did not show up for nutrition or other appointments at cancer center today.   Erik Martinez SOLON, CSO, LDN Registered Dietitian [phone]

## 2024-01-01 ENCOUNTER — Inpatient Hospital Stay

## 2024-01-12 ENCOUNTER — Encounter: Payer: Self-pay | Admitting: Oncology

## 2024-01-16 ENCOUNTER — Emergency Department
Admission: EM | Admit: 2024-01-16 | Discharge: 2024-01-16 | Disposition: A | Discharge status: Home / Self Care | Attending: Emergency Medicine | Admitting: Emergency Medicine

## 2024-01-16 ENCOUNTER — Encounter: Payer: Self-pay | Admitting: Oncology

## 2024-01-16 ENCOUNTER — Emergency Department

## 2024-01-16 DIAGNOSIS — Z96652 Presence of left artificial knee joint: Secondary | ICD-10-CM | POA: Insufficient documentation

## 2024-01-16 DIAGNOSIS — E1165 Type 2 diabetes mellitus with hyperglycemia: Secondary | ICD-10-CM | POA: Diagnosis not present

## 2024-01-16 DIAGNOSIS — I5032 Chronic diastolic (congestive) heart failure: Secondary | ICD-10-CM | POA: Insufficient documentation

## 2024-01-16 DIAGNOSIS — R4781 Slurred speech: Secondary | ICD-10-CM | POA: Diagnosis not present

## 2024-01-16 DIAGNOSIS — E860 Dehydration: Secondary | ICD-10-CM | POA: Diagnosis not present

## 2024-01-16 DIAGNOSIS — Z85038 Personal history of other malignant neoplasm of large intestine: Secondary | ICD-10-CM | POA: Diagnosis not present

## 2024-01-16 DIAGNOSIS — I5030 Unspecified diastolic (congestive) heart failure: Secondary | ICD-10-CM | POA: Diagnosis not present

## 2024-01-16 DIAGNOSIS — Z96642 Presence of left artificial hip joint: Secondary | ICD-10-CM | POA: Diagnosis not present

## 2024-01-16 DIAGNOSIS — R29810 Facial weakness: Secondary | ICD-10-CM | POA: Insufficient documentation

## 2024-01-16 LAB — URINALYSIS, W/ REFLEX TO CULTURE (INFECTION SUSPECTED)
Bilirubin Urine: NEGATIVE
Glucose, UA: >=500 mg/dL — AB
Hgb urine dipstick: NEGATIVE
Ketones, ur: NEGATIVE mg/dL
Leukocytes,Ua: NEGATIVE
Nitrite: NEGATIVE
Protein, ur: NEGATIVE mg/dL
RBC / HPF: 0 RBC/hpf (ref 0–5)
Specific Gravity, Urine: 1.018 (ref 1.005–1.030)
Squamous Epithelial / HPF: 0 /HPF (ref 0–5)
pH: 7 (ref 5.0–8.0)

## 2024-01-16 LAB — COMPREHENSIVE METABOLIC PANEL WITH GFR
ALT: 7 U/L (ref 0–44)
AST: 18 U/L (ref 15–41)
Albumin: 4 g/dL (ref 3.5–5.0)
Alkaline Phosphatase: 139 U/L — ABNORMAL HIGH (ref 38–126)
Anion gap: 17 — ABNORMAL HIGH (ref 5–15)
BUN: 15 mg/dL (ref 8–23)
CO2: 21 mmol/L — ABNORMAL LOW (ref 22–32)
Calcium: 9.3 mg/dL (ref 8.9–10.3)
Chloride: 92 mmol/L — ABNORMAL LOW (ref 98–111)
Creatinine, Ser: 1.18 mg/dL (ref 0.61–1.24)
GFR, Estimated: >60 mL/min
Glucose, Bld: 505 mg/dL (ref 70–99)
Potassium: 3.6 mmol/L (ref 3.5–5.1)
Sodium: 130 mmol/L — ABNORMAL LOW (ref 135–145)
Total Bilirubin: 0.6 mg/dL (ref 0.0–1.2)
Total Protein: 8.7 g/dL — ABNORMAL HIGH (ref 6.5–8.1)

## 2024-01-16 LAB — CBC WITH DIFFERENTIAL/PLATELET
Abs Immature Granulocytes: 0.04 K/uL (ref 0.00–0.07)
Basophils Absolute: 0 K/uL (ref 0.0–0.1)
Basophils Relative: 0 %
Eosinophils Absolute: 0.1 K/uL (ref 0.0–0.5)
Eosinophils Relative: 1 %
HCT: 38.2 % — ABNORMAL LOW (ref 39.0–52.0)
Hemoglobin: 11.2 g/dL — ABNORMAL LOW (ref 13.0–17.0)
Immature Granulocytes: 0 %
Lymphocytes Relative: 20 %
Lymphs Abs: 2 K/uL (ref 0.7–4.0)
MCH: 20.1 pg — ABNORMAL LOW (ref 26.0–34.0)
MCHC: 29.3 g/dL — ABNORMAL LOW (ref 30.0–36.0)
MCV: 68.5 fL — ABNORMAL LOW (ref 80.0–100.0)
Monocytes Absolute: 0.8 K/uL (ref 0.1–1.0)
Monocytes Relative: 8 %
Neutro Abs: 6.9 K/uL (ref 1.7–7.7)
Neutrophils Relative %: 71 %
Platelets: 409 K/uL — ABNORMAL HIGH (ref 150–400)
RBC: 5.58 MIL/uL (ref 4.22–5.81)
RDW: 21.6 % — ABNORMAL HIGH (ref 11.5–15.5)
Smear Review: NORMAL
WBC: 9.8 K/uL (ref 4.0–10.5)
nRBC: 0 % (ref 0.0–0.2)

## 2024-01-16 LAB — BETA-HYDROXYBUTYRIC ACID: Beta-Hydroxybutyric Acid: 0.05 mmol/L (ref 0.05–0.27)

## 2024-01-16 LAB — ETHANOL: Alcohol, Ethyl (B): <15 mg/dL

## 2024-01-16 LAB — PROTIME-INR
INR: 1.3 — ABNORMAL HIGH (ref 0.8–1.2)
Prothrombin Time: 16.7 s — ABNORMAL HIGH (ref 11.4–15.2)

## 2024-01-16 LAB — APTT: aPTT: 31 s (ref 24–36)

## 2024-01-16 MED ORDER — SODIUM CHLORIDE 0.9 % IV BOLUS
1000.0000 mL | Freq: Once | INTRAVENOUS | Status: AC
  Administered 2024-01-16: 1000 mL via INTRAVENOUS

## 2024-01-16 MED ORDER — DOXYCYCLINE HYCLATE 100 MG PO CAPS: 100.0000 mg | ORAL_CAPSULE | Freq: Two times a day (BID) | ORAL | 0 refills | Status: AC

## 2024-01-16 MED ORDER — DOXYCYCLINE HYCLATE 100 MG PO TABS
100.0000 mg | ORAL_TABLET | Freq: Once | ORAL | Status: AC
  Administered 2024-01-16: 100 mg via ORAL
  Filled 2024-01-16: qty 1

## 2024-01-16 NOTE — ED Notes (Signed)
 Patient provided a urinal, instructed to notify when has UA sample.

## 2024-01-16 NOTE — ED Provider Notes (Signed)
 "  [facility] Provider Note    Event Date/Time   First MD Initiated Contact with Patient 01/16/24 1902     (approximate)   History   Chief Complaint: Facial Droop   HPI  Erik Martinez is a 69 y.o. male with a history of diabetes, anxiety, heart failure, colon cancer who is brought to the ED from home due to left facial droop for the past 2 days.  Also reports slurring his speech.  Constant over the last 2 days.  Associated with headache.  No chest pain or shortness of breath or fever, no neck pain.  Extremity Rafay weakness but feels like his left side is subjectively weaker.        Past Medical History:  Diagnosis Date   (HFpEF) heart failure with preserved ejection fraction (HCC)    Adenocarcinoma, colon (HCC) 08/25/2023   Anemia    Anxiety    Aortic atherosclerosis    Arthritis    Avascular necrosis of bones of both hips (HCC)    Bipolar disorder (HCC)    Bladder spasms    Chronic pain of left knee    Chronic pain syndrome    a.) followed by pain clinic in [place]   Chronic, continuous use of opioids    a.) managed by pain clinic in [place]   DDD (degenerative disc disease)    Depression    Erectile dysfunction    a.) on PDE5i (tadalafil)   Frequency-urgency syndrome    GERD (gastroesophageal reflux disease)    Hepatitis C 2018   History of anesthesia complications    a.) PONV; b.) PDPH; c.) difficult to sedate   History of hepatitis B    1983  TX'D--  NO ISSUES OR SYMPTOMS SINCE   Hyperlipidemia    Hypotestosteronemia    Insomnia    a.) on hypnotic PRN (zolpidem )   Multiple lung nodules on CT    a.) CT CAP 08/25/2023: along oblique fissures - favored to be benign   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   Neurogenic bladder    Nocturia    PAD (peripheral artery disease)    Polysubstance abuse (HCC)    a.) cocaine + marijuana + BZO + opioids   PONV (postoperative nausea and vomiting)    Spinal headache    T2DM (type 2 diabetes mellitus)  [facility])    Urine incontinence     Current Outpatient Rx   Order #: [id number] Class: Normal   Order #: [id number] Class: Normal   Order #: [id number] Class: Historical Med   Order #: [id number] Class: Normal   Order #: [id number] Class: Normal   Order #: [id number] Class: Historical Med   Order #: [id number] Class: Normal   Order #: [id number] Class: Normal   Order #: [id number] Class: Normal   Order #: [id number] Class: Historical Med   Order #: [id number] Class: Normal   Order #: [id number] Class: Normal   Order #: [id number] Class: Normal   Order #: [id number] Class: Normal   Order #: [id number] Class: Normal   Order #: [id number] Class: Normal   Order #: [id number] Class: Print   Order #: [id number] Class: Normal   Order #: [id number] Class: Normal   Order #: [id number] Class: Historical Med   Order #: [id number] Class: Normal   Order #: [id number] Class: Normal   Order #: [id number] Class: Normal   Order #: [id number] Class: Normal   Order #: [id number] Class: Normal   Order #: [id number] Class: Normal   Order #: [id number] Class: No Print   Order #: [id number] Class: Normal   Order #: [id number] Class: Historical Med   Order #:  [id number] Class: Normal   Order #: [id number] Class: Normal   Order #: [id number] Class: Normal    Past Surgical History:  Procedure Laterality Date   APPLICATION OF WOUND VAC Left 09/11/2021   Procedure: APPLICATION OF WOUND VAC;  Surgeon: [name], MD;  Location: [facility] ORS;  Service: Orthopedics;  Laterality: Left;  [id number]    BACK SURGERY     2 rods and 4 screws artificial disc   COLONOSCOPY  2008   COLONOSCOPY N/A 08/25/2023   Procedure: COLONOSCOPY;  Surgeon: [name], MD;  Location: [facility] ENDOSCOPY;  Service: Endoscopy;  Laterality: N/A;   ESOPHAGOGASTRODUODENOSCOPY N/A 08/25/2023   Procedure: EGD (ESOPHAGOGASTRODUODENOSCOPY);  Surgeon: [name], MD;  Location: [facility] ENDOSCOPY;  Service: Endoscopy;  Laterality: N/A;   INTERSTIM IMPLANT PLACEMENT  2007   INTERSTIM IMPLANT REVISION  N/A 08/17/2012   Procedure: REPLACMENT OF IPG PLUS REPLACE LEAD OF INTERSTIM IMPLANT ;  Surgeon: [name], MD;  Location: [facility];  Service: Urology;  Laterality: N/A;   IRRIGATION AND DEBRIDEMENT KNEE Left 09/11/2021   Procedure: IRRIGATION AND DEBRIDEMENT WITH POLY EXCHANGE LEFT KNEE;  Surgeon: [name], MD;  Location: [facility];  Service: Orthopedics;  Laterality: Left;   IRRIGATION AND DEBRIDEMENT KNEE Left 06/01/2022   Procedure: IRRIGATION AND DEBRIDEMENT KNEE;  Surgeon: [name], MD;  Location: [facility];  Service: Orthopedics;  Laterality: Left;   LOWER EXTREMITY ANGIOGRAPHY Left 11/17/2020   Procedure: LOWER EXTREMITY ANGIOGRAPHY;  Surgeon: [name], MD;  Location: [facility];  Service: Cardiovascular;  Laterality: Left;   LOWER EXTREMITY ANGIOGRAPHY Right 12/22/2020   Procedure: LOWER EXTREMITY ANGIOGRAPHY;  Surgeon: [name], MD;  Location: [facility];  Service: Cardiovascular;  Laterality: Right;   LOWER EXTREMITY ANGIOGRAPHY Left 12/14/2021   Procedure: Lower Extremity Angiography;  Surgeon: [name], MD;  Location: [facility];  Service: Cardiovascular;  Laterality: Left;   LUMBAR DISC SURGERY  2005   L5   LUMBAR FUSION  X2  2006  &  2007   MANIPULATION KNEE JOINT Left 08/2002   OPEN DEBRIDEMENT LEFT TOTAL KNEE (SCAR, BONEY GRAOWTH)/ REMOVAL OLD SUTURES  05/01/2007   PARTIAL HIP ARTHROPLASTY Left 2001   x2   PORTACATH PLACEMENT N/A 10/27/2023   Procedure: INSERTION, TUNNELED CENTRAL VENOUS DEVICE, WITH PORT;  Surgeon: Tye Millet, DO;  Location: [facility];  Service: General;  Laterality: N/A;   SHOULDER OPEN ROTATOR CUFF REPAIR Left 2000   TOTAL HIP ARTHROPLASTY Left 2002   TOTAL KNEE ARTHROPLASTY Left 06/2002   PARTIAL LEFT KNEE REPLACEMENT PRIOR TO THIS   TOTAL KNEE REVISION Left 2008   TOTAL KNEE REVISION Left 07/19/2021   Procedure: TOTAL KNEE REVISION;  Surgeon: [name], MD;  Location: [facility];  Service: Orthopedics;  Laterality: Left;   TOTAL KNEE REVISION Left 2005   TOTAL KNEE REVISION Left 06/01/2022   Procedure: TOTAL KNEE REVISION;  Surgeon: [name], MD;  Location: [facility];  Service: Orthopedics;  Laterality: Left;   TOTAL KNEE REVISION Left 04/01/2022   Procedure: Removal of left total knee implants with insertion of spacer.;  Surgeon: [name], MD;  Location: [facility];  Service: Orthopedics;  Laterality: Left;   TRIGGER FINGER RELEASE Bilateral    SEVERAL FINGERS   TRIGGER FINGER RELEASE Right 06/18/2014   Procedure: RELEASE TRIGGER FINGER/A-1 PULLEY;  Surgeon: Lynwood SQUIBB Mardee, MD;  Location: [facility];  Service: Orthopedics;  Laterality: Right;  Physical Exam   Triage Vital Signs: ED Triage Vitals [01/16/24 1853]  Encounter Vitals Group     BP 110/73     Girls Systolic BP Percentile      Girls Diastolic BP Percentile      Boys Systolic BP Percentile      Boys Diastolic BP Percentile      Pulse Rate (!) 105     Resp 18     Temp 98.1 F (36.7 C)     Temp Source Oral     SpO2 100 %     Weight 140 lb (63.5 kg)     Height 5' 9 (1.753 m)     Head Circumference      Peak Flow      Pain Score 6     Pain Loc      Pain Education      Exclude from Growth Chart     Most recent vital signs: Vitals:   01/16/24 2130 01/16/24 2200  BP: 136/77 (!) 136/92  Pulse: 87 96  Resp: 16 17  Temp:    SpO2:  97%    General: Awake, no distress.  CV:  Good peripheral perfusion.  Tachycardia heart rate 105 Resp:  Normal effort.  Clear lungs Abd:  No distention.  Soft nontender Other:  Dysarthria, left facial droop.  EOMs intact.  Normal language.  No drift.  Normal finger-to-nose   ED Results / Procedures / Treatments   Labs (all labs ordered are listed, but only abnormal results are displayed) Labs Reviewed  PROTIME-INR - Abnormal; Notable for the following components:      Result Value   Prothrombin Time 16.7 (*)     INR 1.3 (*)    All other components within normal limits  COMPREHENSIVE METABOLIC PANEL WITH GFR - Abnormal; Notable for the following components:   Sodium 130 (*)    Chloride 92 (*)    CO2 21 (*)    Glucose, Bld 505 (*)    Total Protein 8.7 (*)    Alkaline Phosphatase 139 (*)    Anion gap 17 (*)    All other components within normal limits  CBC WITH DIFFERENTIAL/PLATELET - Abnormal; Notable for the following components:   Hemoglobin 11.2 (*)    HCT 38.2 (*)    MCV 68.5 (*)    MCH 20.1 (*)    MCHC 29.3 (*)    RDW 21.6 (*)    Platelets 409 (*)    All other components within normal limits  URINALYSIS, W/ REFLEX TO CULTURE (INFECTION SUSPECTED) - Abnormal; Notable for the following components:   Color, Urine STRAW (*)    APPearance CLEAR (*)    Glucose, UA >=500 (*)    Bacteria, UA RARE (*)    All other components within normal limits  APTT  ETHANOL  BETA-HYDROXYBUTYRIC ACID  CBG MONITORING, ED     EKG Interpreted by me Sinus rhythm rate of 96.  Normal axis and intervals.  Normal QRS ST segments and T waves   RADIOLOGY CT head interpreted by me, negative for mass or intracranial hemorrhage.  Radiology report reviewed   PROCEDURES:  Procedures   MEDICATIONS ORDERED IN ED: Medications  sodium chloride  0.9 % bolus 1,000 mL (0 mLs Intravenous Stopped 01/16/24 2128)  doxycycline  (VIBRA -TABS) tablet 100 mg (100 mg Oral Given 01/16/24 2219)     IMPRESSION / MDM / ASSESSMENT AND PLAN / ED COURSE  I reviewed the triage vital signs and the nursing notes.  DDx: Acute ischemic stroke, electrolyte derangement, anemia, seizure  Patient's presentation is most consistent with acute presentation with potential threat to life or bodily function.  Patient presents with left facial droop and dysarthria, most suspicious for acute stroke.  CT head negative, labs reveal hyperglycemia with a mild elevated anion gap.  IV fluids for hydration.  Will plan to hospitalize for further  neuroevaluation.   ----------------------------------------- 10:27 PM on 01/16/2024 ----------------------------------------- After IV fluids patient feels much better, dysarthria and facial droop resolved.  Neuro intact.  Offered admission for further stroke workup, patient declines, wishes to go home where he will be more vigilant about diabetic diet adherence, taking his medications.  Following up with primary care.  He does note a small area of swelling at the left prepatellar bursa with some thin drainage.  Will prescribe doxycycline .  Medically stable and has decision-making capacity.      FINAL CLINICAL IMPRESSION(S) / ED DIAGNOSES   Final diagnoses:  Type 2 diabetes mellitus with hyperglycemia, without long-term current use of insulin  (HCC)  Dehydration     Rx / DC Orders   ED Discharge Orders          Ordered    doxycycline  (VIBRAMYCIN ) 100 MG capsule  2 times daily        01/16/24 2211             Note:  This document was prepared using Dragon voice recognition software and may include unintentional dictation errors.   Viviann Pastor, MD 01/16/24 2228  "

## 2024-01-16 NOTE — ED Triage Notes (Signed)
 Pt presents to the ED via POV from home. Pt reports his mouth has been twitching for two days. Reports noticing a left sided facial droop x2 days. Reports slurred speech since yesterday. Pt reports the facial twitching is coming in episodes. Pt had one of the episodes that last about 45 seconds. Pt was not able to talk during this episode, but was able to nod his head and remained alert.

## 2024-01-16 NOTE — ED Notes (Signed)
 ED Provider at bedside.

## 2024-01-17 ENCOUNTER — Telehealth: Payer: Self-pay | Admitting: Oncology

## 2024-01-17 ENCOUNTER — Encounter: Payer: Self-pay | Admitting: Oncology

## 2024-01-17 ENCOUNTER — Other Ambulatory Visit: Payer: Self-pay | Admitting: Family Medicine

## 2024-01-17 DIAGNOSIS — G4700 Insomnia, unspecified: Secondary | ICD-10-CM

## 2024-01-17 NOTE — Telephone Encounter (Signed)
 Copied from CRM #[id number]. Topic: Clinical - Medication Refill >> Jan 17, 2024 10:46 AM Aleatha C wrote: Medication:  zolpidem  (AMBIEN ) 10 MG tablet    Has the patient contacted their pharmacy? No (Agent: If no, request that the patient contact the pharmacy for the refill. If patient does not wish to contact the pharmacy document the reason why and proceed with request.) (Agent: If yes, when and what did the pharmacy advise?)  This is the patient's preferred pharmacy:  [facility] - [place], [place] - [name] [address] Phone: [phone] Fax: [phone]  Is this the correct pharmacy for this prescription? Yes If no, delete pharmacy and type the correct one.   Has the prescription been filled recently? No  Is the patient out of the medication? No 2 days  Has the patient been seen for an appointment in the last year OR does the patient have an upcoming appointment? Yes  Can we respond through MyChart? No  Agent: Please be advised that Rx refills may take up to 3 business days. We ask that you follow-up with your pharmacy.

## 2024-01-17 NOTE — Telephone Encounter (Signed)
 I will see him sometime 2 weeks from now.  No labs no treatment just see me

## 2024-01-17 NOTE — Telephone Encounter (Signed)
 Pt called and stated he has missed a lot of his chemo appts and his life is falling apart. He stated that MD told him if he kept missing appts he would not be able to be a patient here. Pt wants to know if he can r/s his appts.   I told him I would send a message to the team and someone would call him back. Phone number in chart is correct per pt.

## 2024-01-17 NOTE — Telephone Encounter (Signed)
 Last office visit 12/19/2023 for hospital follow up. Last refilled 12/15/2023 for #30 with 1 refill.  Next appt: 01/25/24 for DM.

## 2024-01-18 ENCOUNTER — Ambulatory Visit
Admission: RE | Admit: 2024-01-18 | Discharge: 2024-01-18 | Disposition: A | Source: Ambulatory Visit | Attending: Cardiology

## 2024-01-18 ENCOUNTER — Other Ambulatory Visit: Payer: Self-pay | Admitting: Family Medicine

## 2024-01-18 DIAGNOSIS — I5032 Chronic diastolic (congestive) heart failure: Secondary | ICD-10-CM | POA: Diagnosis present

## 2024-01-18 DIAGNOSIS — I2510 Atherosclerotic heart disease of native coronary artery without angina pectoris: Secondary | ICD-10-CM | POA: Diagnosis not present

## 2024-01-18 DIAGNOSIS — I517 Cardiomegaly: Secondary | ICD-10-CM | POA: Insufficient documentation

## 2024-01-18 DIAGNOSIS — K449 Diaphragmatic hernia without obstruction or gangrene: Secondary | ICD-10-CM | POA: Insufficient documentation

## 2024-01-18 DIAGNOSIS — I700 Atherosclerosis of aorta: Secondary | ICD-10-CM | POA: Diagnosis not present

## 2024-01-18 MED ORDER — REGADENOSON 0.4 MG/5ML IV SOLN
0.4000 mg | Freq: Once | INTRAVENOUS | Status: AC
  Administered 2024-01-18: 0.4 mg via INTRAVENOUS
  Filled 2024-01-18: qty 5

## 2024-01-18 MED ORDER — ZOLPIDEM TARTRATE 10 MG PO TABS: 10.0000 mg | ORAL_TABLET | Freq: Every day | ORAL | 1 refills | Status: AC

## 2024-01-18 MED ORDER — RUBIDIUM RB82 GENERATOR (RUBYFILL)
25.0000 | PACK | Freq: Once | INTRAVENOUS | Status: AC
  Administered 2024-01-18: 19.92 via INTRAVENOUS

## 2024-01-18 MED ORDER — RUBIDIUM RB82 GENERATOR (RUBYFILL)
25.0000 | PACK | Freq: Once | INTRAVENOUS | Status: AC
  Administered 2024-01-18: 19.93 via INTRAVENOUS

## 2024-01-18 MED ORDER — REGADENOSON 0.4 MG/5ML IV SOLN
INTRAVENOUS | Status: AC
  Filled 2024-01-18: qty 5

## 2024-01-20 ENCOUNTER — Other Ambulatory Visit: Payer: Self-pay

## 2024-01-21 LAB — NM PET CT CARDIAC PERFUSION MULTI W/ABSOLUTE BLOODFLOW
MBFR: 1.04
Nuc Rest EF: 34 %
Nuc Stress EF: 39 %
Peak HR: 90 {beats}/min
Rest HR: 82 {beats}/min
Rest MBF: 0.77 ml/g/min
Rest Nuclear Isotope Dose: 19.9 mCi
SRS: 1
SSS: 19
ST Depression (mm): 0 mm
Stress MBF: 0.8 ml/g/min
Stress Nuclear Isotope Dose: 19.9 mCi
TID: 0.95

## 2024-01-25 ENCOUNTER — Ambulatory Visit: Admitting: Family Medicine

## 2024-01-31 ENCOUNTER — Inpatient Hospital Stay: Attending: Oncology | Admitting: Oncology

## 2024-02-15 ENCOUNTER — Telehealth: Payer: Self-pay | Admitting: Pharmacy Technician

## 2024-02-15 ENCOUNTER — Encounter: Payer: Self-pay | Admitting: Oncology

## 2024-02-15 ENCOUNTER — Emergency Department

## 2024-02-15 ENCOUNTER — Other Ambulatory Visit: Payer: Self-pay

## 2024-02-15 ENCOUNTER — Other Ambulatory Visit (HOSPITAL_COMMUNITY): Payer: Self-pay

## 2024-02-15 ENCOUNTER — Emergency Department
Admission: EM | Admit: 2024-02-15 | Discharge: 2024-02-15 | Disposition: A | Discharge status: Home / Self Care | Source: Home / Self Care | Attending: Emergency Medicine | Admitting: Emergency Medicine

## 2024-02-15 DIAGNOSIS — G8929 Other chronic pain: Secondary | ICD-10-CM

## 2024-02-15 DIAGNOSIS — R410 Disorientation, unspecified: Secondary | ICD-10-CM

## 2024-02-15 LAB — COMPREHENSIVE METABOLIC PANEL WITH GFR
ALT: 17 U/L (ref 0–44)
AST: 20 U/L (ref 15–41)
Albumin: 3.9 g/dL (ref 3.5–5.0)
Alkaline Phosphatase: 96 U/L (ref 38–126)
Anion gap: 13 (ref 5–15)
BUN: 25 mg/dL — ABNORMAL HIGH (ref 8–23)
CO2: 20 mmol/L — ABNORMAL LOW (ref 22–32)
Calcium: 8.8 mg/dL — ABNORMAL LOW (ref 8.9–10.3)
Chloride: 105 mmol/L (ref 98–111)
Creatinine, Ser: 1.03 mg/dL (ref 0.61–1.24)
GFR, Estimated: >60 mL/min
Glucose, Bld: 198 mg/dL — ABNORMAL HIGH (ref 70–99)
Potassium: 3.9 mmol/L (ref 3.5–5.1)
Sodium: 138 mmol/L (ref 135–145)
Total Bilirubin: 0.2 mg/dL (ref 0.0–1.2)
Total Protein: 7.5 g/dL (ref 6.5–8.1)

## 2024-02-15 LAB — CBC WITH DIFFERENTIAL/PLATELET
Abs Immature Granulocytes: 0.02 10*3/uL (ref 0.00–0.07)
Basophils Absolute: 0 10*3/uL (ref 0.0–0.1)
Basophils Relative: 1 %
Eosinophils Absolute: 0.2 10*3/uL (ref 0.0–0.5)
Eosinophils Relative: 2 %
HCT: 30.2 % — ABNORMAL LOW (ref 39.0–52.0)
Hemoglobin: 9.3 g/dL — ABNORMAL LOW (ref 13.0–17.0)
Immature Granulocytes: 0 %
Lymphocytes Relative: 20 %
Lymphs Abs: 1.7 10*3/uL (ref 0.7–4.0)
MCH: 21 pg — ABNORMAL LOW (ref 26.0–34.0)
MCHC: 30.8 g/dL (ref 30.0–36.0)
MCV: 68.3 fL — ABNORMAL LOW (ref 80.0–100.0)
Monocytes Absolute: 0.7 10*3/uL (ref 0.1–1.0)
Monocytes Relative: 9 %
Neutro Abs: 5.9 10*3/uL (ref 1.7–7.7)
Neutrophils Relative %: 68 %
Platelets: 226 10*3/uL (ref 150–400)
RBC: 4.42 MIL/uL (ref 4.22–5.81)
RDW: 21.5 % — ABNORMAL HIGH (ref 11.5–15.5)
Smear Review: NORMAL
WBC: 8.5 10*3/uL (ref 4.0–10.5)
nRBC: 0 % (ref 0.0–0.2)

## 2024-02-15 LAB — URINALYSIS, ROUTINE W REFLEX MICROSCOPIC
Bacteria, UA: NONE SEEN
Bilirubin Urine: NEGATIVE
Glucose, UA: 50 mg/dL — AB
Ketones, ur: NEGATIVE mg/dL
Leukocytes,Ua: NEGATIVE
Nitrite: NEGATIVE
Protein, ur: 30 mg/dL — AB
Specific Gravity, Urine: 1.017 (ref 1.005–1.030)
Squamous Epithelial / HPF: 0 /HPF (ref 0–5)
pH: 5 (ref 5.0–8.0)

## 2024-02-15 LAB — PRO BRAIN NATRIURETIC PEPTIDE: Pro Brain Natriuretic Peptide: 1636 pg/mL — ABNORMAL HIGH

## 2024-02-15 LAB — RESP PANEL BY RT-PCR (RSV, FLU A&B, COVID)  RVPGX2
Influenza A by PCR: NEGATIVE
Influenza B by PCR: NEGATIVE
Resp Syncytial Virus by PCR: NEGATIVE
SARS Coronavirus 2 by RT PCR: NEGATIVE

## 2024-02-15 LAB — LACTIC ACID, PLASMA: Lactic Acid, Venous: 1.3 mmol/L (ref 0.5–1.9)

## 2024-02-15 LAB — TROPONIN T, HIGH SENSITIVITY: Troponin T High Sensitivity: 26 ng/L — ABNORMAL HIGH (ref 0–19)

## 2024-02-15 LAB — AMMONIA: Ammonia: 21 umol/L (ref 9–35)

## 2024-02-15 MED ORDER — SODIUM CHLORIDE 0.9 % IV BOLUS
500.0000 mL | Freq: Once | INTRAVENOUS | Status: AC
  Administered 2024-02-15: 500 mL via INTRAVENOUS

## 2024-02-15 MED ORDER — OXYCODONE HCL 5 MG PO TABS
10.0000 mg | ORAL_TABLET | Freq: Once | ORAL | Status: AC
  Administered 2024-02-15: 10 mg via ORAL
  Filled 2024-02-15: qty 2

## 2024-02-15 MED ORDER — PREGABALIN 200 MG PO CAPS: 200.0000 mg | ORAL_CAPSULE | Freq: Three times a day (TID) | ORAL | 0 refills | Status: AC

## 2024-02-15 MED ORDER — OXYCODONE HCL 20 MG PO TABS: 20.0000 mg | ORAL_TABLET | Freq: Four times a day (QID) | ORAL | 0 refills | Status: DC | PRN

## 2024-02-15 MED ORDER — PREGABALIN 75 MG PO CAPS
200.0000 mg | ORAL_CAPSULE | Freq: Every day | ORAL | Status: DC
  Administered 2024-02-15: 200 mg via ORAL
  Filled 2024-02-15: qty 1

## 2024-02-15 MED ORDER — OXYCODONE-ACETAMINOPHEN 5-325 MG PO TABS
2.0000 | ORAL_TABLET | Freq: Once | ORAL | Status: AC
  Administered 2024-02-15: 2 via ORAL
  Filled 2024-02-15: qty 2

## 2024-02-15 NOTE — ED Triage Notes (Signed)
 Pt arrives via EMS from home. Pt was last seen well at 1900 by family. Family called ems due to patient experiencing confusion. He walked outside this morning without shoes.   Pt arrives AxOx4. NO focal neurological deficits noted. He reports ongoing chronic pain that his PCP is not taking care of.   BG 309 BP:160/93 HR:120 SPO2: 98% RA Temp: 98.6

## 2024-02-15 NOTE — Telephone Encounter (Signed)
 Oral Oncology Patient Advocate Encounter   New authorization   Received notification that prior authorization for ondansetron  (ZOFRAN ) 8 MG tablet is required.   PA submitted on CMM via Lat[employer] [id number] Status is pending     Erik Martinez (Patty) Chet Burnet, CPhT  [facility], Zelda Salmon, [facility] - Oral Chemotherapy Pharmacy Technician III Certified Patient Advocate Phone: [phone]  Fax: [phone]

## 2024-02-15 NOTE — Telephone Encounter (Signed)
 Oral Oncology Patient Advocate Encounter  Prior Authorization for ondansetron  (ZOFRAN ) 8 MG tablet has been approved.    PA# [id number] Effective dates: 02/15/2024 through 01/09/2025   Nitin Mckowen (Patty) Chet Burnet, CPhT  [facility] - [facility], Zelda [place]mon, [facility] - Oral Chemotherapy Pharmacy Technician III Certified Patient Advocate Phone: [phone]  Fax: [phone]

## 2024-02-15 NOTE — Discharge Instructions (Addendum)
 Follow-up with your primary care provider.  We have prescribed 10 tablets of the oxycodone  to cover you for a few days, as well as a 15-day supply of the Lyrica .  Return to the ER immediately for new, worsening, recurrent episodes of weakness or dizziness, confusion or change in mental status, fever, vomiting, abdominal pain, palpitations, chest pain, elevated heart rate, or any other new or worsening symptoms that concern you.

## 2024-02-15 NOTE — ED Provider Notes (Signed)
 "  [facility] Provider Note    Event Date/Time   First MD Initiated Contact with Patient 02/15/24 [phone]     (approximate)   History   Altered Mental Status, Pain, and Hyperglycemia   HPI  Erik Martinez is a 69 y.o. male with diabetes, CHF, colon cancer, and anxiety who presents with a concern for altered mental status.  Per EMS, the patient was last seen at his baseline around 7 PM last night.  The family called EMS due to the patient appearing confused and walking outside this morning without shoes.  The patient himself states that he might have been a little bit confused earlier although is feeling fine now.  He denies any acute complaints.  He reports chronic abdominal pain and states that his primary care provider will not fill his medication that was helping.  He denies any acute complaints.  I reviewed the past medical records.  The patient was seen in the ED on 1/6 with left facial droop and slurred speech.  The symptoms resolved.  Workup was negative.  Previously, his most recent outpatient encounter was with primary care on 12/9 of last year for follow-up after a hospitalization for CHF at the end of November.   Physical Exam   Triage Vital Signs: ED Triage Vitals  Encounter Vitals Group     BP      Girls Systolic BP Percentile      Girls Diastolic BP Percentile      Boys Systolic BP Percentile      Boys Diastolic BP Percentile      Pulse      Resp      Temp      Temp src      SpO2      Weight      Height      Head Circumference      Peak Flow      Pain Score      Pain Loc      Pain Education      Exclude from Growth Chart     Most recent vital signs: Vitals:   02/15/24 1000 02/15/24 1015  BP: (!) 167/118   Pulse: (!) 127 (!) 130  Resp: (!) 27 17  Temp:    SpO2: 96% 97%     General: Alert oriented, comfortable appearing, no distress. CV:  Good peripheral perfusion.  Normal heart sounds. Resp:  Normal effort.  Lungs  CTAB. Abd:  Slightly distended.  Soft and nontender. Other:  EOMI.  PERRLA.  No photophobia.  No facial droop.  Normal speech.  Motor and sensory intact in all extremities.  No ataxia.  Slightly dry mucous membranes.   ED Results / Procedures / Treatments   Labs (all labs ordered are listed, but only abnormal results are displayed) Labs Reviewed  COMPREHENSIVE METABOLIC PANEL WITH GFR - Abnormal; Notable for the following components:      Result Value   CO2 20 (*)    Glucose, Bld 198 (*)    BUN 25 (*)    Calcium  8.8 (*)    All other components within normal limits  CBC WITH DIFFERENTIAL/PLATELET - Abnormal; Notable for the following components:   Hemoglobin 9.3 (*)    HCT 30.2 (*)    MCV 68.3 (*)    MCH 21.0 (*)    RDW 21.5 (*)    All other components within normal limits  URINALYSIS, ROUTINE W REFLEX MICROSCOPIC - Abnormal; Notable for  the following components:   Color, Urine YELLOW (*)    APPearance CLEAR (*)    Glucose, UA 50 (*)    Hgb urine dipstick SMALL (*)    Protein, ur 30 (*)    All other components within normal limits  PRO BRAIN NATRIURETIC PEPTIDE - Abnormal; Notable for the following components:   Pro Brain Natriuretic Peptide 1,636.0 (*)    All other components within normal limits  TROPONIN T, HIGH SENSITIVITY - Abnormal; Notable for the following components:   Troponin T High Sensitivity 26 (*)    All other components within normal limits  RESP PANEL BY RT-PCR (RSV, FLU A&B, COVID)  RVPGX2  AMMONIA  LACTIC ACID, PLASMA     EKG  ED ECG REPORT I, Waylon Cassis, the attending physician, personally viewed and interpreted this ECG.  Date: 02/15/2024 EKG Time: 0833 Rate: 119 Rhythm: Sinus tachycardia QRS Axis: normal Intervals: normal ST/T Wave abnormalities: Repolarization abnormality Narrative Interpretation: Nonspecific abnormalities with no evidence of acute ischemia; no significant change when compared to EKG of  01/16/2024    RADIOLOGY  CT head: I independently viewed and interpreted the images; there is no ICH.  Radiology report indicates no acute abnormality.  Chest x-ray: No focal consolidation or edema  PROCEDURES:  Critical Care performed: No  Procedures   MEDICATIONS ORDERED IN ED: Medications  pregabalin  (LYRICA ) capsule 200 mg (200 mg Oral Given 02/15/24 1132)  oxyCODONE  (Oxy IR/ROXICODONE ) immediate release tablet 10 mg (has no administration in time range)  oxyCODONE -acetaminophen  (PERCOCET/ROXICET) 5-325 MG per tablet 2 tablet (2 tablets Oral Given 02/15/24 0915)  sodium chloride  0.9 % bolus 500 mL (500 mLs Intravenous New Bag/Given 02/15/24 1132)     IMPRESSION / MDM / ASSESSMENT AND PLAN / ED COURSE  I reviewed the triage vital signs and the nursing notes.  69 year old male presents after EMS was called due to possible altered mental status.  The patient is currently alert and oriented denies acute complaints.  On exam he is tachycardic with a borderline elevated temperature.  Other vital signs are normal.  Neurologic exam is nonfocal.  Differential diagnosis includes, but is not limited to, influenza, COVID, other viral syndrome, UTI, pneumonia, other infection, dehydration, electrolyte abnormality, AKI, other metabolic cause, less likely CNS or cardiac etiology.  Will obtain CT head, chest x-ray, lab workup, and reassess.  Patient's presentation is most consistent with acute presentation with potential threat to life or bodily function.  The patient is on the cardiac monitor to evaluate for evidence of arrhythmia and/or significant heart rate changes.  ----------------------------------------- 12:52 PM on 02/15/2024 -----------------------------------------  The patient has now been in the ED for over 4 hours and remains alert and oriented.  His lab workup is overall reassuring.  CMP shows no acute findings.  CBC shows stable anemia.  Lactate is negative.  Troponin is  unchanged from prior.  Urinalysis shows no evidence of UTI.  Ammonia is normal.  BNP is down from prior.  Respiratory panel is negative.  CT head and chest x-ray also negative.  At this time, there is no evidence of an acute medical issue.  The etiology of the patient's confusion earlier is unclear.  I did offer admission to the hospital for further observation.  However the patient is adamant that he would like to go home.  Given the negative, he is stable for discharge at this time.  The patient has been somewhat tachycardic throughout his ED stay, although on my most recent reassessment after  some fluids his heart rate was between 110-115.  He is asymptomatic.    I counseled the patient on the results of the workup, answered all of his questions, and gave strict return precautions.  I also called and discussed his care with his son Garnette who agreed with discharge.  From looking at the PDMP, the patient was chronically on 20 mg oxycodone  tablets to be taken every 4 hours.  He last received a prescription on 12/19.  He states that he was unable to get the prescription from his pain management specialist last month.  He states he has also been off of his Lyrica .  I have prescribed 10 tablets of the oxycodone  to cover him until he can either follow back up with his pain management provider or primary care provider, as well as a 15-day supply of the Lyrica  after verifying the doses of both medications from prior records.  The patient is in agreement with this plan.   FINAL CLINICAL IMPRESSION(S) / ED DIAGNOSES   Final diagnoses:  Confusion  Exacerbation of chronic back pain     Rx / DC Orders   ED Discharge Orders          Ordered    pregabalin  (LYRICA ) 200 MG capsule  3 times daily        02/15/24 1250    Oxycodone  HCl 20 MG TABS  Every 6 hours PRN        02/15/24 1250             Note:  This document was prepared using Dragon voice recognition software and may include  unintentional dictation errors.    Jacolyn Pae, MD 02/15/24 1255  "

## 2024-02-16 ENCOUNTER — Ambulatory Visit: Admitting: Family Medicine

## 2024-02-16 ENCOUNTER — Telehealth: Payer: Self-pay

## 2024-02-16 ENCOUNTER — Encounter: Payer: Self-pay | Admitting: Family Medicine

## 2024-02-16 VITALS — BP 110/62 | HR 121 | Temp 98.1°F | Ht 69.0 in | Wt 183.0 lb

## 2024-02-16 DIAGNOSIS — G8929 Other chronic pain: Secondary | ICD-10-CM

## 2024-02-16 DIAGNOSIS — E1142 Type 2 diabetes mellitus with diabetic polyneuropathy: Secondary | ICD-10-CM

## 2024-02-16 DIAGNOSIS — C19 Malignant neoplasm of rectosigmoid junction: Secondary | ICD-10-CM

## 2024-02-16 DIAGNOSIS — G894 Chronic pain syndrome: Secondary | ICD-10-CM

## 2024-02-16 DIAGNOSIS — F321 Major depressive disorder, single episode, moderate: Secondary | ICD-10-CM

## 2024-02-16 DIAGNOSIS — F411 Generalized anxiety disorder: Secondary | ICD-10-CM

## 2024-02-16 MED ORDER — TRIAMCINOLONE ACETONIDE 0.5 % EX CREA: 1.0000 | TOPICAL_CREAM | Freq: Two times a day (BID) | CUTANEOUS | 0 refills | Status: AC

## 2024-02-16 MED ORDER — OXYCODONE HCL 20 MG PO TABS: 20.0000 mg | ORAL_TABLET | Freq: Four times a day (QID) | ORAL | 0 refills | Status: AC | PRN

## 2024-02-16 NOTE — Progress Notes (Signed)
 Complex Care Management Note  Care Guide Note 02/16/2024 Name: Erik Martinez MRN: [id number] DOB: June 20, 1955  Erik Martinez is a 69 y.o. year old male who sees [name], MD for primary care. I reached out to Dempsey GORMAN Cave by phone today to offer complex care management services.  Mr. Range was given information about Complex Care Management services today including:   The Complex Care Management services include support from the care team which includes your Nurse Care Manager, Clinical Social Worker, or Pharmacist.  The Complex Care Management team is here to help remove barriers to the health concerns and goals most important to you. Complex Care Management services are voluntary, and the patient may decline or stop services at any time by request to their care team member.   Complex Care Management Consent Status: Patient agreed to services and verbal consent obtained.   Follow up plan:  Telephone appointment with complex care management team member scheduled for:  03/06/24  Encounter Outcome:  Patient Scheduled  Debbe Fuse [facility], [facility] Guide  Direct Dial: [phone]  Fax [phone]

## 2024-02-16 NOTE — Patient Instructions (Addendum)
 [facility]:  will be able to get:  statin, pantoprazole , jardiance ,  pregabalin  Will look into getting GLP1  Ozempic  replacement through them given medicare no longer covers.  Decrease oxycodone  to  every 6 hours.  Continue pregabalin  200 mg TID.  Do not take valium  or benadryl .

## 2024-02-16 NOTE — Progress Notes (Unsigned)
 "   Patient ID: Erik Martinez, male    DOB: [birth year], 69 y.o.   MRN: [id number]  This visit was conducted in person.  BP 110/62   Pulse (!) 121   Temp 98.1 F (36.7 C) (Temporal)   Ht 5' 9 (1.753 m)   Wt 183 lb (83 kg)   SpO2 99%   BMI 27.02 kg/m    CC:  Chief Complaint  Patient presents with   Hospitalization Follow-up    Patient wants you to take over prescribing all his medication.     Subjective:   HPI: Erik Martinez is a 69 y.o. male presenting on 02/16/2024 for Hospitalization Follow-up (Patient wants you to take over prescribing all his medication. )   ED visit 02/15/2024 for confusion  Per EMS, the patient was last seen at his baseline around 7 PM last night. The family called EMS due to the patient appearing confused and walking outside this morning without shoes.    CMP shows no acute findings. CBC shows stable anemia. Lactate is negative. Troponin is unchanged from prior. Urinalysis shows no evidence of UTI. Ammonia is normal. BNP is down from prior. Respiratory panel is negative. CT head and chest x-ray also negative.   Given IVF.  Also  seen in the ED on 1/6 with left facial droop and slurred speech. The symptoms resolved. Workup was negative.     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 Currently receiving chemotherapy. Has been using Valium  per oncology for anxiety.  Of note patient has been on multiple sedating medications in the last 1-2 months.  Per PDMP report oxycodone , pregabalin , zolpidem  and as well as diazepam .  Has multiple prescribers.. from   [facility] clinic [facility], surgeon to oncologist to myself.    He was out of lyrica  and oxycodone  in the last 3 weeks.[name] given new restriction and procedures at  [facility] and [facility]. Had been on fentanyl  patches but were to  costly.    He repots he had been  found confused in last few days.   He states he has been itching so took  too much benadryl ... thinks he just kept taking it.  Had been drinking lots of soda coke zero with caffeine  Has not been eating right.   He is in chronic uncontrolled pain in left knee and low back.  Feet have been hurting since being out of lyrica . [facility]:  will be able to get:  statin, pantoprazole , jardiance ,  pregabalin  Will look into getting GLP1 through them given medicare no longer covers.   He can control pain with  oxycodone  20 mg six times a day.. feels normal.  Would probably be able to do better with 4 times daily.  BP Readings from Last 3 Encounters:  02/16/24 110/62  02/15/24 (!) 143/64  01/18/24 (!) 95/49        Relevant past medical, surgical, family and social history reviewed and updated as indicated. Interim medical history since our last visit reviewed. Allergies and medications reviewed and updated. Outpatient Medications Prior to Visit  Medication Sig Dispense Refill   amLODipine  (NORVASC ) 5 MG tablet Take 1 tablet (5 mg total) by mouth daily. 180 tablet 3   atorvastatin  (LIPITOR) 20 MG tablet Take 20 mg by mouth at bedtime.     celecoxib  (CELEBREX ) 200  MG capsule Take 1 capsule (200 mg total) by mouth 2 (two) times daily. 180 capsule 0   Continuous Glucose Sensor (FREESTYLE LIBRE 3 PLUS SENSOR) MISC CHANGE SENSOR EVERY 15 DAYS 2 each 11   empagliflozin  (JARDIANCE ) 25 MG TABS tablet Take 1 tablet (25 mg total) by mouth daily. 90 tablet 1   furosemide  (LASIX ) 40 MG tablet Take 1 tablet (40 mg total) by mouth daily. Take at least 1 tab daily.  If having a high salt meal or you notice that you are becoming more edematous take a second dose in the evening 90 tablet 1   insulin  aspart (NOVOLOG ) 100 UNIT/ML injection Per sliding scale  MAX 15 units in 24 hour period. 10 mL PRN   lidocaine  (LIDODERM ) 5 % Place 3 patches  onto the skin daily. Remove & Discard patch within 12 hours or as directed by MD 90 patch 0   lidocaine -prilocaine  (EMLA ) cream Apply to affected area once 30 g 3   lubiprostone  (AMITIZA ) 8 MCG capsule Take 1 capsule (8 mcg total) by mouth 3 (three) times daily. 90 capsule 1   naloxone  (NARCAN ) nasal spray 4 mg/0.1 mL Take in case of opioid overdose 2 each 0   ondansetron  (ZOFRAN ) 8 MG tablet Take 1 tablet (8 mg total) by mouth every 8 (eight) hours as needed for nausea or vomiting. 30 tablet 1   Oxycodone  HCl 20 MG TABS Take 1 tablet (20 mg total) by mouth every 6 (six) hours as needed for up to 3 days (severe pain). 10 tablet 0   pantoprazole  (PROTONIX ) 40 MG tablet Take 1 tablet (40 mg total) by mouth daily. 90 tablet 3   polyethylene glycol (MIRALAX  / GLYCOLAX ) 17 g packet Take 17 g by mouth daily as needed for mild constipation. 14 each 0   potassium chloride  SA (KLOR-CON  M) 20 MEQ tablet Take 1 tablet (20 mEq total) by mouth daily. 90 tablet 3   pregabalin  (LYRICA ) 200 MG capsule Take 1 capsule (200 mg total) by mouth 3 (three) times daily. 45 capsule 0   prochlorperazine  (COMPAZINE ) 10 MG tablet Take 1 tablet (10 mg total) by mouth every 6 (six) hours as needed for nausea or vomiting. 30 tablet 1   Semaglutide , 2 MG/DOSE, (OZEMPIC , 2 MG/DOSE,) 8 MG/3ML SOPN Inject 2 mg into the skin once a week. Monday     spironolactone  (ALDACTONE ) 25 MG tablet Take 1 tablet (25 mg total) by mouth daily. 90 tablet 3   tadalafil (CIALIS) 20 MG tablet Take 20 mg by mouth daily as needed for erectile dysfunction.     terbinafine  (LAMISIL ) 250 MG tablet TAKE 1 TABLET BY MOUTH ONCE DAILY 90 tablet 0   triamcinolone  cream (KENALOG ) 0.5 % Apply 1 Application topically 2 (two) times daily. 30 g 0   zolpidem  (AMBIEN ) 10 MG tablet Take 1 tablet (10 mg total) by mouth at bedtime. 30 tablet 1   cyclobenzaprine  (FLEXERIL ) 10 MG tablet Take 10 mg by mouth 3 (three) times daily. (Patient not taking: Reported on 02/16/2024)      diazepam  (VALIUM ) 5 MG tablet Take 1 tablet (5 mg total) by mouth at bedtime. (Patient not taking: Reported on 02/16/2024) 14 tablet 0   fentaNYL  (DURAGESIC ) 25 MCG/HR Place 1 patch onto the skin every 3 (three) days. (Patient not taking: Reported on 02/16/2024)     iron  polysaccharides (NIFEREX) 150 MG capsule Take 1 capsule (150 mg total) by mouth daily. (Patient not taking: Reported on 02/16/2024) 30 capsule 0  dexamethasone  (DECADRON ) 0.5 MG/5ML solution Take 10 mLs (1 mg total) by mouth in the morning, at noon, in the evening, and at bedtime. Swish 10 ml (1 mg total) of solution in mouth for 2 minutes then spit. Repeat every 6 hours. Do not swallow. Do not take anything by mouth for 1 hour after taking. For mucositis. 280 mL 0   magic mouthwash w/lidocaine  SOLN Take 5 mLs by mouth 4 (four) times daily as needed for mouth pain. swish and swallow 480 mL 3   predniSONE  (DELTASONE ) 10 MG tablet 3 tabs by mouth daily x 3 days, then 2 tabs by mouth daily x 2 days then 1 tab by mouth daily x 2 days 15 tablet 0   No facility-administered medications prior to visit.     Per HPI unless specifically indicated in ROS section below Review of Systems Objective:  BP 110/62   Pulse (!) 121   Temp 98.1 F (36.7 C) (Temporal)   Ht 5' 9 (1.753 m)   Wt 183 lb (83 kg)   SpO2 99%   BMI 27.02 kg/m   Wt Readings from Last 3 Encounters:  02/16/24 183 lb (83 kg)  01/18/24 170 lb (77.1 kg)  01/16/24 140 lb (63.5 kg)      Physical Exam    Results for orders placed or performed during the hospital encounter of 02/15/24  Resp panel by RT-PCR (RSV, Flu A&B, Covid) Anterior Nasal Swab   Collection Time: 02/15/24  8:37 AM   Specimen: Anterior Nasal Swab  Result Value Ref Range   SARS Coronavirus 2 by RT PCR NEGATIVE NEGATIVE   Influenza A by PCR NEGATIVE NEGATIVE   Influenza B by PCR NEGATIVE NEGATIVE   Resp Syncytial Virus by PCR NEGATIVE NEGATIVE  Comprehensive metabolic panel   Collection Time:  02/15/24  8:37 AM  Result Value Ref Range   Sodium 138 135 - 145 mmol/L   Potassium 3.9 3.5 - 5.1 mmol/L   Chloride 105 98 - 111 mmol/L   CO2 20 (L) 22 - 32 mmol/L   Glucose, Bld 198 (H) 70 - 99 mg/dL   BUN 25 (H) 8 - 23 mg/dL   Creatinine, Ser 8.96 0.61 - 1.24 mg/dL   Calcium  8.8 (L) 8.9 - 10.3 mg/dL   Total Protein 7.5 6.5 - 8.1 g/dL   Albumin 3.9 3.5 - 5.0 g/dL   AST 20 15 - 41 U/L   ALT 17 0 - 44 U/L   Alkaline Phosphatase 96 38 - 126 U/L   Total Bilirubin 0.2 0.0 - 1.2 mg/dL   GFR, Estimated >39 >39 mL/min   Anion gap 13 5 - 15  CBC with Differential   Collection Time: 02/15/24  8:37 AM  Result Value Ref Range   WBC 8.5 4.0 - 10.5 K/uL   RBC 4.42 4.22 - 5.81 MIL/uL   Hemoglobin 9.3 (L) 13.0 - 17.0 g/dL   HCT 69.7 (L) 60.9 - 47.9 %   MCV 68.3 (L) 80.0 - 100.0 fL   MCH 21.0 (L) 26.0 - 34.0 pg   MCHC 30.8 30.0 - 36.0 g/dL   RDW 78.4 (H) 88.4 - 84.4 %   Platelets 226 150 - 400 K/uL   nRBC 0.0 0.0 - 0.2 %   Neutrophils Relative % 68 %   Neutro Abs 5.9 1.7 - 7.7 K/uL   Lymphocytes Relative 20 %   Lymphs Abs 1.7 0.7 - 4.0 K/uL   Monocytes Relative 9 %   Monocytes Absolute 0.7  0.1 - 1.0 K/uL   Eosinophils Relative 2 %   Eosinophils Absolute 0.2 0.0 - 0.5 K/uL   Basophils Relative 1 %   Basophils Absolute 0.0 0.0 - 0.1 K/uL   WBC Morphology MORPHOLOGY UNREMARKABLE    RBC Morphology See Note    Smear Review Normal platelet morphology    Immature Granulocytes 0 %   Abs Immature Granulocytes 0.02 0.00 - 0.07 K/uL  Pro Brain natriuretic peptide   Collection Time: 02/15/24  8:37 AM  Result Value Ref Range   Pro Brain Natriuretic Peptide 1,636.0 (H) <300.0 pg/mL  Ammonia   Collection Time: 02/15/24  8:37 AM  Result Value Ref Range   Ammonia 21 9 - 35 umol/L  Troponin T, High Sensitivity   Collection Time: 02/15/24  8:37 AM  Result Value Ref Range   Troponin T High Sensitivity 26 (H) 0 - 19 ng/L  Lactic acid, plasma   Collection Time: 02/15/24  8:42 AM  Result Value  Ref Range   Lactic Acid, Venous 1.3 0.5 - 1.9 mmol/L  Urinalysis, Routine w reflex microscopic -Urine, Clean Catch   Collection Time: 02/15/24  9:25 AM  Result Value Ref Range   Color, Urine YELLOW (A) YELLOW   APPearance CLEAR (A) CLEAR   Specific Gravity, Urine 1.017 1.005 - 1.030   pH 5.0 5.0 - 8.0   Glucose, UA 50 (A) NEGATIVE mg/dL   Hgb urine dipstick SMALL (A) NEGATIVE   Bilirubin Urine NEGATIVE NEGATIVE   Ketones, ur NEGATIVE NEGATIVE mg/dL   Protein, ur 30 (A) NEGATIVE mg/dL   Nitrite NEGATIVE NEGATIVE   Leukocytes,Ua NEGATIVE NEGATIVE   RBC / HPF 0-5 0 - 5 RBC/hpf   WBC, UA 0-5 0 - 5 WBC/hpf   Bacteria, UA NONE SEEN NONE SEEN   Squamous Epithelial / HPF 0 0 - 5 /HPF   Mucus PRESENT     Assessment and Plan  There are no diagnoses linked to this encounter.  No follow-ups on file.   Greig Ring, MD  "

## 2024-03-01 ENCOUNTER — Ambulatory Visit: Admitting: Family Medicine

## 2024-03-06 ENCOUNTER — Telehealth: Admitting: *Deleted
